# Patient Record
Sex: Female | Born: 1950 | Race: Black or African American | Hispanic: No | Marital: Married | Smoking: Former smoker
Health system: Southern US, Community
[De-identification: ages and names within clinical notes are randomized; demographics above are authoritative.]

## PROBLEM LIST (undated history)

## (undated) DIAGNOSIS — R0600 Dyspnea, unspecified: Secondary | ICD-10-CM

## (undated) DIAGNOSIS — T8859XA Other complications of anesthesia, initial encounter: Secondary | ICD-10-CM

## (undated) DIAGNOSIS — M1990 Unspecified osteoarthritis, unspecified site: Secondary | ICD-10-CM

## (undated) DIAGNOSIS — T4145XA Adverse effect of unspecified anesthetic, initial encounter: Secondary | ICD-10-CM

## (undated) DIAGNOSIS — I499 Cardiac arrhythmia, unspecified: Secondary | ICD-10-CM

## (undated) DIAGNOSIS — I10 Essential (primary) hypertension: Secondary | ICD-10-CM

## (undated) DIAGNOSIS — I517 Cardiomegaly: Secondary | ICD-10-CM

## (undated) DIAGNOSIS — I209 Angina pectoris, unspecified: Secondary | ICD-10-CM

## (undated) DIAGNOSIS — Z9989 Dependence on other enabling machines and devices: Secondary | ICD-10-CM

## (undated) DIAGNOSIS — I4821 Permanent atrial fibrillation: Secondary | ICD-10-CM

## (undated) DIAGNOSIS — E785 Hyperlipidemia, unspecified: Secondary | ICD-10-CM

## (undated) DIAGNOSIS — R600 Localized edema: Secondary | ICD-10-CM

## (undated) DIAGNOSIS — I420 Dilated cardiomyopathy: Secondary | ICD-10-CM

## (undated) DIAGNOSIS — I2510 Atherosclerotic heart disease of native coronary artery without angina pectoris: Secondary | ICD-10-CM

## (undated) DIAGNOSIS — I700 Atherosclerosis of aorta: Secondary | ICD-10-CM

## (undated) DIAGNOSIS — E876 Hypokalemia: Secondary | ICD-10-CM

## (undated) DIAGNOSIS — Z9861 Coronary angioplasty status: Secondary | ICD-10-CM

## (undated) DIAGNOSIS — R011 Cardiac murmur, unspecified: Secondary | ICD-10-CM

## (undated) DIAGNOSIS — C50919 Malignant neoplasm of unspecified site of unspecified female breast: Secondary | ICD-10-CM

## (undated) DIAGNOSIS — G4733 Obstructive sleep apnea (adult) (pediatric): Secondary | ICD-10-CM

## (undated) DIAGNOSIS — F419 Anxiety disorder, unspecified: Secondary | ICD-10-CM

## (undated) DIAGNOSIS — N183 Chronic kidney disease, stage 3 unspecified: Secondary | ICD-10-CM

## (undated) DIAGNOSIS — R0609 Other forms of dyspnea: Secondary | ICD-10-CM

## (undated) DIAGNOSIS — D259 Leiomyoma of uterus, unspecified: Secondary | ICD-10-CM

## (undated) DIAGNOSIS — I509 Heart failure, unspecified: Secondary | ICD-10-CM

## (undated) DIAGNOSIS — I5042 Chronic combined systolic (congestive) and diastolic (congestive) heart failure: Secondary | ICD-10-CM

## (undated) DIAGNOSIS — I272 Pulmonary hypertension, unspecified: Secondary | ICD-10-CM

## (undated) DIAGNOSIS — K219 Gastro-esophageal reflux disease without esophagitis: Secondary | ICD-10-CM

## (undated) DIAGNOSIS — I340 Nonrheumatic mitral (valve) insufficiency: Principal | ICD-10-CM

## (undated) HISTORY — PX: COLONOSCOPY: SHX174

## (undated) HISTORY — DX: Cardiac arrhythmia, unspecified: I49.9

## (undated) HISTORY — DX: Pulmonary hypertension, unspecified: I27.20

## (undated) HISTORY — DX: Dependence on other enabling machines and devices: Z99.89

## (undated) HISTORY — DX: Permanent atrial fibrillation: I48.21

## (undated) HISTORY — PX: TRANSTHORACIC ECHOCARDIOGRAM: SHX275

## (undated) HISTORY — PX: NM MYOVIEW LTD: HXRAD82

## (undated) HISTORY — DX: Angina pectoris, unspecified: I20.9

## (undated) HISTORY — DX: Obstructive sleep apnea (adult) (pediatric): G47.33

## (undated) HISTORY — DX: Chronic combined systolic (congestive) and diastolic (congestive) heart failure: I50.42

## (undated) HISTORY — DX: Atherosclerotic heart disease of native coronary artery without angina pectoris: I25.10

## (undated) HISTORY — DX: Dilated cardiomyopathy: I42.0

## (undated) HISTORY — PX: DILATION AND CURETTAGE OF UTERUS: SHX78

## (undated) HISTORY — DX: Coronary angioplasty status: Z98.61

## (undated) HISTORY — DX: Localized edema: R60.0

---

## 1999-05-19 HISTORY — PX: MASTECTOMY: SHX3

## 2004-05-18 DIAGNOSIS — I2510 Atherosclerotic heart disease of native coronary artery without angina pectoris: Secondary | ICD-10-CM

## 2004-05-18 HISTORY — PX: CORONARY ANGIOPLASTY WITH STENT PLACEMENT: SHX49

## 2004-05-18 HISTORY — DX: Atherosclerotic heart disease of native coronary artery without angina pectoris: I25.10

## 2006-05-18 DIAGNOSIS — C50919 Malignant neoplasm of unspecified site of unspecified female breast: Secondary | ICD-10-CM

## 2006-05-18 HISTORY — PX: MASTECTOMY: SHX3

## 2006-05-18 HISTORY — DX: Malignant neoplasm of unspecified site of unspecified female breast: C50.919

## 2008-04-23 LAB — PAP SMEAR

## 2011-07-12 ENCOUNTER — Inpatient Hospital Stay (HOSPITAL_COMMUNITY)
Admission: EM | Admit: 2011-07-12 | Discharge: 2011-07-14 | DRG: 287 | Disposition: A | Discharge status: Home / Self Care | Payer: 59 | Source: Ambulatory Visit | Attending: Cardiology | Admitting: Cardiology

## 2011-07-12 ENCOUNTER — Encounter (HOSPITAL_COMMUNITY): Payer: Self-pay

## 2011-07-12 ENCOUNTER — Emergency Department (HOSPITAL_COMMUNITY): Payer: 59

## 2011-07-12 ENCOUNTER — Other Ambulatory Visit: Payer: Self-pay

## 2011-07-12 DIAGNOSIS — IMO0001 Reserved for inherently not codable concepts without codable children: Secondary | ICD-10-CM | POA: Diagnosis present

## 2011-07-12 DIAGNOSIS — G4733 Obstructive sleep apnea (adult) (pediatric): Secondary | ICD-10-CM | POA: Diagnosis present

## 2011-07-12 DIAGNOSIS — E119 Type 2 diabetes mellitus without complications: Secondary | ICD-10-CM | POA: Diagnosis present

## 2011-07-12 DIAGNOSIS — I4891 Unspecified atrial fibrillation: Principal | ICD-10-CM

## 2011-07-12 DIAGNOSIS — Z87891 Personal history of nicotine dependence: Secondary | ICD-10-CM

## 2011-07-12 DIAGNOSIS — Z79899 Other long term (current) drug therapy: Secondary | ICD-10-CM

## 2011-07-12 DIAGNOSIS — I25118 Atherosclerotic heart disease of native coronary artery with other forms of angina pectoris: Secondary | ICD-10-CM | POA: Diagnosis present

## 2011-07-12 DIAGNOSIS — E876 Hypokalemia: Secondary | ICD-10-CM | POA: Diagnosis present

## 2011-07-12 DIAGNOSIS — I209 Angina pectoris, unspecified: Secondary | ICD-10-CM | POA: Diagnosis present

## 2011-07-12 DIAGNOSIS — Z6841 Body Mass Index (BMI) 40.0 and over, adult: Secondary | ICD-10-CM

## 2011-07-12 DIAGNOSIS — D259 Leiomyoma of uterus, unspecified: Secondary | ICD-10-CM | POA: Diagnosis present

## 2011-07-12 DIAGNOSIS — E1169 Type 2 diabetes mellitus with other specified complication: Secondary | ICD-10-CM | POA: Diagnosis present

## 2011-07-12 DIAGNOSIS — I10 Essential (primary) hypertension: Secondary | ICD-10-CM | POA: Diagnosis present

## 2011-07-12 DIAGNOSIS — E785 Hyperlipidemia, unspecified: Secondary | ICD-10-CM | POA: Diagnosis present

## 2011-07-12 DIAGNOSIS — I2510 Atherosclerotic heart disease of native coronary artery without angina pectoris: Secondary | ICD-10-CM | POA: Diagnosis present

## 2011-07-12 DIAGNOSIS — I059 Rheumatic mitral valve disease, unspecified: Secondary | ICD-10-CM | POA: Diagnosis present

## 2011-07-12 DIAGNOSIS — I517 Cardiomegaly: Secondary | ICD-10-CM | POA: Diagnosis present

## 2011-07-12 DIAGNOSIS — Z9861 Coronary angioplasty status: Secondary | ICD-10-CM

## 2011-07-12 DIAGNOSIS — K224 Dyskinesia of esophagus: Secondary | ICD-10-CM | POA: Diagnosis present

## 2011-07-12 DIAGNOSIS — Z7982 Long term (current) use of aspirin: Secondary | ICD-10-CM

## 2011-07-12 HISTORY — DX: Essential (primary) hypertension: I10

## 2011-07-12 HISTORY — DX: Leiomyoma of uterus, unspecified: D25.9

## 2011-07-12 HISTORY — DX: Hyperlipidemia, unspecified: E78.5

## 2011-07-12 HISTORY — DX: Morbid (severe) obesity due to excess calories: E66.01

## 2011-07-12 HISTORY — DX: Malignant neoplasm of unspecified site of unspecified female breast: C50.919

## 2011-07-12 LAB — DIFFERENTIAL
Basophils Relative: 0 % (ref 0–1)
Eosinophils Absolute: 0.1 10*3/uL (ref 0.0–0.7)
Neutro Abs: 3.5 10*3/uL (ref 1.7–7.7)
Neutrophils Relative %: 49 % (ref 43–77)

## 2011-07-12 LAB — COMPREHENSIVE METABOLIC PANEL
ALT: 36 U/L — ABNORMAL HIGH (ref 0–35)
AST: 40 U/L — ABNORMAL HIGH (ref 0–37)
Albumin: 3.8 g/dL (ref 3.5–5.2)
Alkaline Phosphatase: 71 U/L (ref 39–117)
Chloride: 99 mEq/L (ref 96–112)
Potassium: 3.9 mEq/L (ref 3.5–5.1)
Sodium: 136 mEq/L (ref 135–145)
Total Protein: 7 g/dL (ref 6.0–8.3)

## 2011-07-12 LAB — CBC
MCH: 27.2 pg (ref 26.0–34.0)
MCHC: 32.8 g/dL (ref 30.0–36.0)
Platelets: 203 10*3/uL (ref 150–400)
RBC: 4.34 MIL/uL (ref 3.87–5.11)

## 2011-07-12 LAB — HEMOGLOBIN A1C
Hgb A1c MFr Bld: 9 % — ABNORMAL HIGH (ref <5.7)
Mean Plasma Glucose: 212 mg/dL — ABNORMAL HIGH (ref <117)

## 2011-07-12 LAB — GLUCOSE, CAPILLARY
Glucose-Capillary: 108 mg/dL — ABNORMAL HIGH (ref 70–99)
Glucose-Capillary: 121 mg/dL — ABNORMAL HIGH (ref 70–99)

## 2011-07-12 LAB — PROTIME-INR
INR: 0.96 (ref 0.00–1.49)
Prothrombin Time: 13 seconds (ref 11.6–15.2)

## 2011-07-12 MED ORDER — SODIUM CHLORIDE 0.9 % IJ SOLN
3.0000 mL | Freq: Two times a day (BID) | INTRAMUSCULAR | Status: DC
  Administered 2011-07-12: 3 mL via INTRAVENOUS

## 2011-07-12 MED ORDER — VALSARTAN-HYDROCHLOROTHIAZIDE 320-25 MG PO TABS: 1.0000 | ORAL_TABLET | Freq: Every day | ORAL | Status: DC

## 2011-07-12 MED ORDER — EZETIMIBE 10 MG PO TABS
10.0000 mg | ORAL_TABLET | Freq: Every day | ORAL | Status: DC
  Administered 2011-07-12: 10 mg via ORAL
  Filled 2011-07-12 (×4): qty 1

## 2011-07-12 MED ORDER — NEBIVOLOL HCL 5 MG PO TABS
5.0000 mg | ORAL_TABLET | Freq: Every day | ORAL | Status: DC
  Administered 2011-07-12 – 2011-07-13 (×2): 5 mg via ORAL
  Filled 2011-07-12 (×4): qty 1

## 2011-07-12 MED ORDER — ATORVASTATIN CALCIUM 10 MG PO TABS
10.0000 mg | ORAL_TABLET | Freq: Every day | ORAL | Status: DC
  Administered 2011-07-12: 10 mg via ORAL
  Filled 2011-07-12 (×4): qty 1

## 2011-07-12 MED ORDER — HYDROCHLOROTHIAZIDE 25 MG PO TABS: 25.0000 mg | ORAL_TABLET | Freq: Every day | ORAL | Status: DC

## 2011-07-12 MED ORDER — ENOXAPARIN SODIUM 30 MG/0.3ML ~~LOC~~ SOLN: 1.0000 mg/kg | Freq: Two times a day (BID) | SUBCUTANEOUS | Status: DC

## 2011-07-12 MED ORDER — ENOXAPARIN SODIUM 40 MG/0.4ML ~~LOC~~ SOLN
40.0000 mg | SUBCUTANEOUS | Status: DC
  Administered 2011-07-12: 40 mg via SUBCUTANEOUS
  Filled 2011-07-12 (×4): qty 0.4

## 2011-07-12 MED ORDER — LORAZEPAM 0.5 MG PO TABS: 0.5000 mg | ORAL_TABLET | Freq: Four times a day (QID) | ORAL | Status: DC | PRN

## 2011-07-12 MED ORDER — INSULIN ASPART 100 UNIT/ML ~~LOC~~ SOLN
0.0000 [IU] | Freq: Every day | SUBCUTANEOUS | Status: DC
  Filled 2011-07-12: qty 3

## 2011-07-12 MED ORDER — ACETAMINOPHEN 325 MG PO TABS: 650.0000 mg | ORAL_TABLET | Freq: Four times a day (QID) | ORAL | Status: DC | PRN

## 2011-07-12 MED ORDER — IRBESARTAN 300 MG PO TABS
300.0000 mg | ORAL_TABLET | Freq: Every day | ORAL | Status: DC
  Filled 2011-07-12 (×4): qty 1

## 2011-07-12 MED ORDER — ISOSORBIDE MONONITRATE ER 30 MG PO TB24
30.0000 mg | ORAL_TABLET | Freq: Every day | ORAL | Status: DC
  Administered 2011-07-12 – 2011-07-14 (×2): 30 mg via ORAL
  Filled 2011-07-12 (×6): qty 1

## 2011-07-12 MED ORDER — ONDANSETRON HCL 4 MG/2ML IJ SOLN: 4.0000 mg | Freq: Four times a day (QID) | INTRAMUSCULAR | Status: DC | PRN

## 2011-07-12 MED ORDER — ACETAMINOPHEN 650 MG RE SUPP: 650.0000 mg | Freq: Four times a day (QID) | RECTAL | Status: DC | PRN

## 2011-07-12 MED ORDER — POTASSIUM CHLORIDE CRYS ER 20 MEQ PO TBCR
20.0000 meq | EXTENDED_RELEASE_TABLET | Freq: Every day | ORAL | Status: DC
  Administered 2011-07-12: 20 meq via ORAL
  Filled 2011-07-12 (×2): qty 1

## 2011-07-12 MED ORDER — FUROSEMIDE 80 MG PO TABS
80.0000 mg | ORAL_TABLET | Freq: Every day | ORAL | Status: DC
Start: 2011-07-12 — End: 2011-07-12
  Administered 2011-07-12: 80 mg via ORAL
  Filled 2011-07-12: qty 1

## 2011-07-12 MED ORDER — COLCHICINE 0.6 MG PO TABS
0.6000 mg | ORAL_TABLET | Freq: Every day | ORAL | Status: DC | PRN
  Filled 2011-07-12: qty 1

## 2011-07-12 MED ORDER — SODIUM CHLORIDE 0.9 % IV SOLN: 250.0000 mL | INTRAVENOUS | Status: DC | PRN

## 2011-07-12 MED ORDER — DIAZEPAM 5 MG PO TABS: 5.0000 mg | ORAL_TABLET | ORAL | Status: DC

## 2011-07-12 MED ORDER — SODIUM CHLORIDE 0.9 % IJ SOLN: 3.0000 mL | INTRAMUSCULAR | Status: DC | PRN

## 2011-07-12 MED ORDER — OXYCODONE HCL 5 MG PO TABS: 5.0000 mg | ORAL_TABLET | ORAL | Status: DC | PRN

## 2011-07-12 MED ORDER — ONDANSETRON HCL 4 MG PO TABS: 4.0000 mg | ORAL_TABLET | Freq: Four times a day (QID) | ORAL | Status: DC | PRN

## 2011-07-12 MED ORDER — HYDROMORPHONE HCL PF 1 MG/ML IJ SOLN: 0.5000 mg | INTRAMUSCULAR | Status: DC | PRN

## 2011-07-12 MED ORDER — ALUM & MAG HYDROXIDE-SIMETH 200-200-20 MG/5ML PO SUSP: 30.0000 mL | Freq: Four times a day (QID) | ORAL | Status: DC | PRN

## 2011-07-12 MED ORDER — INSULIN ASPART 100 UNIT/ML ~~LOC~~ SOLN
0.0000 [IU] | Freq: Three times a day (TID) | SUBCUTANEOUS | Status: DC
  Administered 2011-07-12 – 2011-07-13 (×3): 2 [IU] via SUBCUTANEOUS
  Filled 2011-07-12: qty 3

## 2011-07-12 MED ORDER — SODIUM CHLORIDE 0.9 % IV SOLN
1.0000 mL/kg/h | INTRAVENOUS | Status: DC
  Administered 2011-07-12: 1 mL/kg/h via INTRAVENOUS

## 2011-07-12 MED ORDER — FUROSEMIDE 80 MG PO TABS
80.0000 mg | ORAL_TABLET | Freq: Every day | ORAL | Status: DC
  Filled 2011-07-12 (×3): qty 1

## 2011-07-12 MED ORDER — SODIUM CHLORIDE 0.9 % IJ SOLN
3.0000 mL | Freq: Two times a day (BID) | INTRAMUSCULAR | Status: DC
  Administered 2011-07-12 – 2011-07-13 (×3): 3 mL via INTRAVENOUS

## 2011-07-12 MED ORDER — VITAMIN D3 25 MCG (1000 UNIT) PO TABS
2000.0000 [IU] | ORAL_TABLET | Freq: Every day | ORAL | Status: DC
  Administered 2011-07-12: 2000 [IU] via ORAL
  Filled 2011-07-12 (×4): qty 2

## 2011-07-12 MED ORDER — SODIUM CHLORIDE 0.9 % IV SOLN
INTRAVENOUS | Status: AC
  Administered 2011-07-12: 06:00:00 via INTRAVENOUS

## 2011-07-12 MED ORDER — ALLOPURINOL 300 MG PO TABS
300.0000 mg | ORAL_TABLET | Freq: Every day | ORAL | Status: DC | PRN
  Filled 2011-07-12: qty 1

## 2011-07-12 MED ORDER — DIAZEPAM 5 MG PO TABS
5.0000 mg | ORAL_TABLET | ORAL | Status: AC
  Administered 2011-07-13: 5 mg via ORAL
  Filled 2011-07-12: qty 1

## 2011-07-12 MED ORDER — ASPIRIN 81 MG PO CHEW
324.0000 mg | CHEWABLE_TABLET | ORAL | Status: AC
  Administered 2011-07-13: 324 mg via ORAL
  Filled 2011-07-12 (×2): qty 4

## 2011-07-12 MED ORDER — ZOLPIDEM TARTRATE 5 MG PO TABS
5.0000 mg | ORAL_TABLET | Freq: Every evening | ORAL | Status: DC | PRN
  Administered 2011-07-12: 5 mg via ORAL
  Filled 2011-07-12: qty 1

## 2011-07-12 MED ORDER — ASPIRIN EC 325 MG PO TBEC
325.0000 mg | DELAYED_RELEASE_TABLET | Freq: Every day | ORAL | Status: DC
  Administered 2011-07-12: 325 mg via ORAL
  Filled 2011-07-12 (×4): qty 1

## 2011-07-12 MED ORDER — PNEUMOCOCCAL VAC POLYVALENT 25 MCG/0.5ML IJ INJ
0.5000 mL | INJECTION | INTRAMUSCULAR | Status: AC
  Administered 2011-07-13: 0.5 mL via INTRAMUSCULAR
  Filled 2011-07-12: qty 0.5

## 2011-07-12 NOTE — Progress Notes (Signed)
Patient admitted from the ED, alert and oriented X3 denies any pain/chest pain. Oriented patient to room/unit; reviewed plan of care with patient/family; skin intact. Patient in bed resting. Continue to assess patient.

## 2011-07-12 NOTE — Consult Note (Addendum)
NAME:  Stephanie Franco   MRN: [id number] DOB:  1951/01/04   ADMIT DATE: 07/12/2011  Reason for Consult: Chest pain  Requesting Physician: [facility]  HPI: This is a 61 y.o. female with a past medical history significant for CAD, s/p sten in 2005 in [place], ([place] to [facility] 7/05). She moved here about a year ago. She has been followed by Dr Karlton Lemon here. Apparently she has been in atrial fibrillation, (? Duration). She was tried on Pradaxa but had vaginal bleeding. Last night she had Lt sided chest pain that radiated to her Rt neck. She took a total of 4 NTG with eventual relief.   PMHx:  Past Medical History  Diagnosis Date  . Coronary artery disease   . Stented coronary artery - L Circumflex Taxus DES 2005  . Hypertension   . Diabetes mellitus   . Breast cancer   . Hyperlipidemia   . Morbid obesity    Past Surgical History  Procedure Date  . Mastectomy     Left Breast 2008  . Ptca with stent x 1     2005    FAMHx: Family History  Problem Relation Age of Onset  . Coronary artery disease Mother   . Hypertension Mother   . Atrial fibrillation Son     SOCHx:  reports that she has quit smoking. She does not have any smokeless tobacco history on file. She reports that she does not drink alcohol or use illicit drugs.  ALLERGIES: No Known Allergies  ROS: Pertinent items are noted in HPI. Review of Systems - Negative except for chest pain & palpitations that are occasionally associated with shortness of breath; recent uterine bleeding on Pradaxa.   HOME MEDICATIONS: Prescriptions prior to admission  Medication Sig Dispense Refill  . allopurinol (ZYLOPRIM) 300 MG tablet Take 300 mg by mouth daily as needed. Gout      . aspirin EC 81 MG tablet Take 81 mg by mouth daily.      . cholecalciferol (VITAMIN D) 1000 UNITS tablet Take 2,000 Units by mouth daily.      . colchicine 0.6 MG tablet Take 0.6 mg by mouth daily as needed. gout      . ezetimibe (ZETIA) 10 MG tablet Take  10 mg by mouth daily.      . furosemide (LASIX) 80 MG tablet Take 80 mg by mouth daily.      Marland Kitchen LORazepam (ATIVAN) 0.5 MG tablet Take 0.5 mg by mouth every 6 (six) hours as needed. Anxiety      . metFORMIN (GLUCOPHAGE-XR) 750 MG 24 hr tablet Take 750 mg by mouth daily with breakfast.      . nebivolol (BYSTOLIC) 5 MG tablet Take 5 mg by mouth daily.      . potassium chloride SA (K-DUR,KLOR-CON) 20 MEQ tablet Take 20 mEq by mouth daily.      . rosuvastatin (CRESTOR) 10 MG tablet Take 10 mg by mouth daily.      . valsartan-hydrochlorothiazide (DIOVAN-HCT) 320-25 MG per tablet Take 1 tablet by mouth daily.        HOSPITAL MEDICATIONS: I have reviewed the patient's current medications.    . sodium chloride   Intravenous STAT  . aspirin  324 mg Oral Pre-Cath  . aspirin EC  325 mg Oral Daily  . atorvastatin  10 mg Oral q1800  . cholecalciferol  2,000 Units Oral Daily  . diazepam  5 mg Oral On Call  . enoxaparin  40 mg Subcutaneous Q24H  .  ezetimibe  10 mg Oral Daily  . furosemide  80 mg Oral Daily  . insulin aspart  0-15 Units Subcutaneous TID WC  . insulin aspart  0-5 Units Subcutaneous QHS  . irbesartan  300 mg Oral Daily  . isosorbide mononitrate  30 mg Oral Daily  . nebivolol  5 mg Oral Daily  . pneumococcal 23 valent vaccine  0.5 mL Intramuscular Tomorrow-1000  . potassium chloride  20 mEq Oral Once  . potassium chloride SA  20 mEq Oral Daily  . potassium chloride  40 mEq Oral Once  . sodium chloride  3 mL Intravenous Q12H  . sodium chloride  3 mL Intravenous Q12H  . DISCONTD: diazepam  5 mg Oral On Call  . DISCONTD: furosemide  80 mg Oral Daily  . DISCONTD: hydrochlorothiazide  25 mg Oral Daily  . DISCONTD: potassium chloride SA  20 mEq Oral Daily  . DISCONTD: valsartan-hydrochlorothiazide  1 tablet Oral Daily   VITALS: Blood pressure 124/76, pulse 59, temperature 98.3 F (36.8 C), temperature source Oral, resp. rate 17, height 5\' 3"  (1.6 m), weight 110.9 kg (244 lb 7.8 oz),  SpO2 97.00%.  PHYSICAL EXAM: General appearance: alert, cooperative and no distress Neck: no adenopathy, no carotid bruit, no JVD, supple, symmetrical, trachea midline and thyroid not enlarged, symmetric, no tenderness/mass/nodules Lungs: clear to auscultation bilaterally Heart: irregularly irregular rhythm - 2/6 HSM heard loudest @ apex. Abdomen: obese Extremities: extremities normal, atraumatic, no cyanosis or edema Pulses: 2+ and symmetric Skin: Skin color, texture, turgor normal. No rashes or lesions; L mastectomy wounds well  Healed. Neurologic: Grossly normal; CN II-XII grossly intact  LABS: Results for orders placed during the hospital encounter of 07/12/11 (from the past 48 hour(s))  CBC     Status: Abnormal   Collection Time   07/12/11  3:27 AM      Component Value Range Comment   WBC 7.2  4.0 - 10.5 (K/uL)    RBC 4.34  3.87 - 5.11 (MIL/uL)    Hemoglobin 11.8 (*) 12.0 - 15.0 (g/dL)    HCT 36.0  36.0 - 46.0 (%)    MCV 82.9  78.0 - 100.0 (fL)    MCH 27.2  26.0 - 34.0 (pg)    MCHC 32.8  30.0 - 36.0 (g/dL)    RDW 15.7 (*) 11.5 - 15.5 (%)    Platelets 203  150 - 400 (K/uL)   DIFFERENTIAL     Status: Normal   Collection Time   07/12/11  3:27 AM      Component Value Range Comment   Neutrophils Relative 49  43 - 77 (%)    Neutro Abs 3.5  1.7 - 7.7 (K/uL)    Lymphocytes Relative 39  12 - 46 (%)    Lymphs Abs 2.8  0.7 - 4.0 (K/uL)    Monocytes Relative 11  3 - 12 (%)    Monocytes Absolute 0.8  0.1 - 1.0 (K/uL)    Eosinophils Relative 1  0 - 5 (%)    Eosinophils Absolute 0.1  0.0 - 0.7 (K/uL)    Basophils Relative 0  0 - 1 (%)    Basophils Absolute 0.0  0.0 - 0.1 (K/uL)   COMPREHENSIVE METABOLIC PANEL     Status: Abnormal   Collection Time   07/12/11  3:27 AM      Component Value Range Comment   Sodium 136  135 - 145 (mEq/L)    Potassium 3.9  3.5 - 5.1 (mEq/L)  Chloride 99  96 - 112 (mEq/L)    CO2 24  19 - 32 (mEq/L)    Glucose, Bld 125 (*) 70 - 99 (mg/dL)    BUN 28  (*) 6 - 23 (mg/dL)    Creatinine, Ser 0.86  0.50 - 1.10 (mg/dL)    Calcium 9.4  8.4 - 10.5 (mg/dL)    Total Protein 7.0  6.0 - 8.3 (g/dL)    Albumin 3.8  3.5 - 5.2 (g/dL)    AST 40 (*) 0 - 37 (U/L) SLIGHT HEMOLYSIS   ALT 36 (*) 0 - 35 (U/L)    Alkaline Phosphatase 71  39 - 117 (U/L)    Total Bilirubin 0.5  0.3 - 1.2 (mg/dL)    GFR calc non Af Amer 71 (*) >90 (mL/min)    GFR calc Af Amer 82 (*) >90 (mL/min)   TROPONIN I     Status: Normal   Collection Time   07/12/11  3:27 AM      Component Value Range Comment   Troponin I <0.30  <0.30 (ng/mL)   HEMOGLOBIN A1C     Status: Abnormal   Collection Time   07/12/11  3:55 AM      Component Value Range Comment   Hemoglobin A1C 9.0 (*) <5.7 (%)    Mean Plasma Glucose 212 (*) <117 (mg/dL)   GLUCOSE, CAPILLARY     Status: Abnormal   Collection Time   07/12/11  9:07 AM      Component Value Range Comment   Glucose-Capillary 108 (*) 70 - 99 (mg/dL)   GLUCOSE, CAPILLARY     Status: Abnormal   Collection Time   07/12/11 12:03 PM      Component Value Range Comment   Glucose-Capillary 121 (*) 70 - 99 (mg/dL)   GLUCOSE, CAPILLARY     Status: Abnormal   Collection Time   07/12/11  4:46 PM      Component Value Range Comment   Glucose-Capillary 136 (*) 70 - 99 (mg/dL)   PROTIME-INR     Status: Normal   Collection Time   07/12/11  4:53 PM      Component Value Range Comment   Prothrombin Time 13.0  11.6 - 15.2 (seconds)    INR 0.96  0.00 - 1.49    GLUCOSE, CAPILLARY     Status: Abnormal   Collection Time   07/12/11  9:55 PM      Component Value Range Comment   Glucose-Capillary 128 (*) 70 - 99 (mg/dL)    Comment 1 Notify RN     BASIC METABOLIC PANEL     Status: Abnormal   Collection Time   07/13/11  4:24 AM      Component Value Range Comment   Sodium 140  135 - 145 (mEq/L)    Potassium 3.1 (*) 3.5 - 5.1 (mEq/L)    Chloride 104  96 - 112 (mEq/L)    CO2 29  19 - 32 (mEq/L)    Glucose, Bld 127 (*) 70 - 99 (mg/dL)    BUN 19  6 - 23 (mg/dL)     Creatinine, Ser 0.75  0.50 - 1.10 (mg/dL)    Calcium 9.5  8.4 - 10.5 (mg/dL)    GFR calc non Af Amer 89 (*) >90 (mL/min)    GFR calc Af Amer >90  >90 (mL/min)   CBC     Status: Abnormal   Collection Time   07/13/11  4:24 AM      Component Value  Range Comment   WBC 5.4  4.0 - 10.5 (K/uL)    RBC 3.97  3.87 - 5.11 (MIL/uL)    Hemoglobin 10.8 (*) 12.0 - 15.0 (g/dL)    HCT 32.9 (*) 36.0 - 46.0 (%)    MCV 82.9  78.0 - 100.0 (fL)    MCH 27.2  26.0 - 34.0 (pg)    MCHC 32.8  30.0 - 36.0 (g/dL)    RDW 15.8 (*) 11.5 - 15.5 (%)    Platelets 214  150 - 400 (K/uL)   LIPID PANEL     Status: Abnormal   Collection Time   07/13/11  4:24 AM      Component Value Range Comment   Cholesterol 114  0 - 200 (mg/dL)    Triglycerides 78  <150 (mg/dL)    HDL 33 (*) >39 (mg/dL)    Total CHOL/HDL Ratio 3.5      VLDL 16  0 - 40 (mg/dL)    LDL Cholesterol 65  0 - 99 (mg/dL)   GLUCOSE, CAPILLARY     Status: Abnormal   Collection Time   07/13/11  8:26 AM      Component Value Range Comment   Glucose-Capillary 150 (*) 70 - 99 (mg/dL)     IMAGING: Dg Chest 2 View  07/12/2011  *RADIOLOGY REPORT*  Clinical Data: Chest pain.  CHEST - 2 VIEW  Comparison: None  Findings: There is moderate cardiomegaly.  Lungs clear.  No effusions.  No acute bony abnormality.  IMPRESSION: Cardiomegaly.  No active disease.  Original Report Authenticated By: Raelyn Number, M.D.   EKG- AF, PVC, LVH by voltage  IMPRESSION: Principal Problem:  *Unstable angina Active Problems:  Coronary artery disease, Stent placed in 2005 in [place], negative nuc 3 yrs ago  Hypertension  Atrial fibrillation, ? duration  Chest pain  Morbid obesity  Uterine fibroid, (bleeding issues when she was put on Pradaxa for AF)  Hyperlipidemia  Sleep apnea, on C-Pap   RECOMMENDATION: MD to see. She may need diagnostic cath. Not on full dose anticoagulation secondary to bleeding histroy   Time Spent Directly with Patient: 40 minutes  Erlene Quan 07/02/2011, 3:35 PM  I have seen & examined the patient this AM after seen by Mr. Rosalyn Gess last PM.  I have reviewed the chart & agree with Luke's note with my attestations noted above in bold.   Stephanie Franco has known CAD with prior PCI and a relatively new Dx of Atrial Fibrillation.  She has not noted "chest pain" associated with her Afib - but has noted some increasing dyspnea & palpitations.  Unfortunately, her Pradaxa is being held due to concern for Uterine Bleeding - but her Hgb is stable.    She has noted some chest pain since her PCI that has always been relieved by SL NTG 1-2 tabs (2nd one usually b/c the 1sr doesn't desolve).  However on the night preceding her admission, she had prolonged pain that was not relieved until the 4th NTG tablet.  Over night she has had 2 runs of NSVT vs Afib with Aberrancy that are more worrisome for NSVT - and therefore more concerning for ischemia.   With her history & RFs as well as obesity (decreasing the effectiveness of NST), we discussed options of invasive vs. Non-invasive evaluation.  Based upon out discussion, she and her husband agree that the most definitive way to evaluate for a CAD origin for her chest pain is with invasive evaluation via cardiac  catheterization.  I agree with Koren Bound initial thoughts in his recommendations. I am a bit concerned with the potential of PCI & requirement of dual antiplatelet therapy -- I would consideder FFR evaluation of equivocal lesions prior to PCI.  Performing MD:  Leonie Man, M.D., M.S.  Procedure:    Left Heart Catheterization with Coronary Angiography  Possible Percutaneous Coronary Intervention (Fractional Flow Reserve Measurement, Balloon, &/or Stent)  The procedure with Risks/Benefits/Alternatives and Indications was reviewed with the patient.  All questions were answered.    Risks / Complications include, but not limited to: Death, MI, CVA/TIA, VF/VT (with defibrillation), Bradycardia (need  for temporary pacer placement), contrast induced nephropathy, bleeding / bruising / hematoma / pseudoaneurysm, vascular or coronary injury (with possible emergent CT or Vascular Surgery), adverse medication reactions, infection.    The patient voices understanding and agree to proceed.     Leonie Man, M.D., M.S. [facility] [address] [place], [place]  [address]  [phone]  07/13/2011 10:44 AM

## 2011-07-12 NOTE — ED Provider Notes (Signed)
History     CSN: [id number]  Arrival date & time 07/12/11  [id number]   First MD Initiated Contact with Patient 07/12/11 [id number]      Chief Complaint  Patient presents with  . Chest Pain    (Consider location/radiation/quality/duration/timing/severity/associated sxs/prior treatment) HPI  H/o CAD s/p stent (2005), HTN, DM, afib pw chest pain since last night 11PM. Describes it as chest discomfort, cramping. No pressure. Intermittent across precordium Occ radiation to Rt neck. Pain 9/10 at max, none currently. Denies shortness of breath. No back pain. Denies fever/chills/cough. Denies n/v/diaphoresis. Took ntg and ASA prior to arrival. Denies h/o VTE in self or family. No recent hosp/surg/immob. She does have h/o breast cancer s/p mastectomy in remission. Denies exogenous hormone use, no leg pain or swelling.  Has upcoming initial appt with [facility]   Past Medical History  Diagnosis Date  . Coronary artery disease   . Stented coronary artery   . Hypertension   . Diabetes mellitus     Past Surgical History  Procedure Date  . Mastectomy     History reviewed. No pertinent family history.  History  Substance Use Topics  . Smoking status: Former Research scientist (life sciences)  . Smokeless tobacco: Not on file  . Alcohol Use: No    OB History    Grav Para Term Preterm Abortions TAB SAB Ect Mult Living                  Review of Systems  All other systems reviewed and are negative.  except as noted HPI   Allergies  Review of patient's allergies indicates no known allergies.  Home Medications   Current Outpatient Rx  Name Route Sig Dispense Refill  . ALLOPURINOL 300 MG PO TABS Oral Take 300 mg by mouth daily as needed. Gout    . ASPIRIN EC 81 MG PO TBEC Oral Take 81 mg by mouth daily.    Marland Kitchen VITAMIN D 1000 UNITS PO TABS Oral Take 2,000 Units by mouth daily.    . COLCHICINE 0.6 MG PO TABS Oral Take 0.6 mg by mouth daily as needed. gout    . EZETIMIBE 10 MG PO TABS Oral Take 10 mg by mouth daily.      . FUROSEMIDE 80 MG PO TABS Oral Take 80 mg by mouth daily.    Marland Kitchen LORAZEPAM 0.5 MG PO TABS Oral Take 0.5 mg by mouth every 6 (six) hours as needed. Anxiety    . METFORMIN HCL ER 750 MG PO TB24 Oral Take 750 mg by mouth daily with breakfast.    . NEBIVOLOL HCL 5 MG PO TABS Oral Take 5 mg by mouth daily.    Marland Kitchen POTASSIUM CHLORIDE CRYS ER 20 MEQ PO TBCR Oral Take 20 mEq by mouth daily.    Marland Kitchen ROSUVASTATIN CALCIUM 10 MG PO TABS Oral Take 10 mg by mouth daily.    Marland Kitchen VALSARTAN-HYDROCHLOROTHIAZIDE 320-25 MG PO TABS Oral Take 1 tablet by mouth daily.      BP 148/70  Pulse 85  Temp(Src) 98.1 F (36.7 C) (Oral)  Resp 17  SpO2 98%   Physical Exam  Nursing note and vitals reviewed. Constitutional: She is oriented to person, place, and time. She appears well-developed.  HENT:  Head: Atraumatic.  Mouth/Throat: Oropharynx is clear and moist.  Eyes: Conjunctivae and EOM are normal. Pupils are equal, round, and reactive to light.  Neck: Normal range of motion. Neck supple.  Cardiovascular: Normal rate, regular rhythm, normal heart sounds and intact distal pulses.  Pulmonary/Chest: Effort normal and breath sounds normal. No respiratory distress. She has no wheezes. She has no rales.  Abdominal: Soft. She exhibits no distension. There is no tenderness. There is no rebound and no guarding.  Musculoskeletal: Normal range of motion.  Neurological: She is alert and oriented to person, place, and time.  Skin: Skin is warm and dry. No rash noted.  Psychiatric: She has a normal mood and affect.    Date: 07/12/2011  Rate: 98  Rhythm: atrial fibrillation  QRS Axis: left  Intervals: QT prolonged  ST/T Wave abnormalities: normal  Conduction Disutrbances:nonspecific intraventricular conduction delay  Narrative Interpretation:   Old EKG Reviewed: none available   ED Course  Procedures (including critical care time)  Labs Reviewed  CBC - Abnormal; Notable for the following:    Hemoglobin 11.8 (*)    RDW  15.7 (*)    All other components within normal limits  COMPREHENSIVE METABOLIC PANEL - Abnormal; Notable for the following:    Glucose, Bld 125 (*)    BUN 28 (*)    AST 40 (*) SLIGHT HEMOLYSIS   ALT 36 (*)    GFR calc non Af Amer 71 (*)    GFR calc Af Amer 82 (*)    All other components within normal limits  DIFFERENTIAL  TROPONIN I   Dg Chest 2 View  07/12/2011  *RADIOLOGY REPORT*  Clinical Data: Chest pain.  CHEST - 2 VIEW  Comparison: None  Findings: There is moderate cardiomegaly.  Lungs clear.  No effusions.  No acute bony abnormality.  IMPRESSION: Cardiomegaly.  No active disease.  Original Report Authenticated By: Raelyn Number, M.D.    1. Chest pain   2. Atrial fibrillation     MDM   60yoF h/o CAD pw intermittent chest pressure radiating to neck. Relieved with ntg, asa pta. Asx in ED.  She has a TIMI score of 3. Less likely acute PE, dissection, pna, ptx. Rate controlled atrial fibrillation. Given RF will admit for formal r/o. She is to be pt of [facility] and [facility] Discussed admission with Dr. Arnoldo Morale.       Blair Heys, MD 07/12/11 [phone]

## 2011-07-12 NOTE — Progress Notes (Signed)
Patient had 2.09sec pause on heart monitor , assessed patient, she denies any pain and stated she was asleep; does have history of sleep apnea. Dr Karleen Hampshire notified, not new order given, will continue to assess patient.

## 2011-07-12 NOTE — ED Notes (Signed)
Patient being transported to floor by Romie Minus.

## 2011-07-12 NOTE — Progress Notes (Signed)
23:15-Notified Aaron Edelman hager,PA with cardiology reguarding the freq pauses tonight & the 2.32 sec pause at 22:36pm & the 7 beats of V-Tach at 22:47-all of which are asymptomatic at this time & pt denies any chest pain, pt is for cardiac cath in the am. He asks not to be called for any more pauses or V-Tach tonight unless the pt has > 10 beats of V-tach & the pt is truly symptomatic at the time of the events. Will con't to monitor pt for problems.

## 2011-07-12 NOTE — ED Notes (Signed)
Pt on hold for transfer per Dr. Arnoldo Morale.  Notified dept.  Pt to hold post shift change for transfer.

## 2011-07-12 NOTE — Progress Notes (Signed)
Subjective: Pain is 2/10.  Objective: Weight change:   Intake/Output Summary (Last 24 hours) at 07/12/11 1609 Last data filed at 07/12/11 1300  Gross per 24 hour  Intake    240 ml  Output    300 ml  Net    -60 ml    Filed Vitals:   07/12/11 1300  BP: 144/83  Pulse: 70  Temp: 99 F (37.2 C)  Resp: 16   Pt is alert afebrile comfortable  cvs s1s2 heard Lungs clear Abdomen soft nontender Extremities no pedal edema.  Lab Results: Results for orders placed during the hospital encounter of 07/12/11 (from the past 24 hour(s))  CBC     Status: Abnormal   Collection Time   07/12/11  3:27 AM      Component Value Range   WBC 7.2  4.0 - 10.5 (K/uL)   RBC 4.34  3.87 - 5.11 (MIL/uL)   Hemoglobin 11.8 (*) 12.0 - 15.0 (g/dL)   HCT 36.0  36.0 - 46.0 (%)   MCV 82.9  78.0 - 100.0 (fL)   MCH 27.2  26.0 - 34.0 (pg)   MCHC 32.8  30.0 - 36.0 (g/dL)   RDW 15.7 (*) 11.5 - 15.5 (%)   Platelets 203  150 - 400 (K/uL)  DIFFERENTIAL     Status: Normal   Collection Time   07/12/11  3:27 AM      Component Value Range   Neutrophils Relative 49  43 - 77 (%)   Neutro Abs 3.5  1.7 - 7.7 (K/uL)   Lymphocytes Relative 39  12 - 46 (%)   Lymphs Abs 2.8  0.7 - 4.0 (K/uL)   Monocytes Relative 11  3 - 12 (%)   Monocytes Absolute 0.8  0.1 - 1.0 (K/uL)   Eosinophils Relative 1  0 - 5 (%)   Eosinophils Absolute 0.1  0.0 - 0.7 (K/uL)   Basophils Relative 0  0 - 1 (%)   Basophils Absolute 0.0  0.0 - 0.1 (K/uL)  COMPREHENSIVE METABOLIC PANEL     Status: Abnormal   Collection Time   07/12/11  3:27 AM      Component Value Range   Sodium 136  135 - 145 (mEq/L)   Potassium 3.9  3.5 - 5.1 (mEq/L)   Chloride 99  96 - 112 (mEq/L)   CO2 24  19 - 32 (mEq/L)   Glucose, Bld 125 (*) 70 - 99 (mg/dL)   BUN 28 (*) 6 - 23 (mg/dL)   Creatinine, Ser 0.86  0.50 - 1.10 (mg/dL)   Calcium 9.4  8.4 - 10.5 (mg/dL)   Total Protein 7.0  6.0 - 8.3 (g/dL)   Albumin 3.8  3.5 - 5.2 (g/dL)   AST 40 (*) 0 - 37 (U/L)   ALT 36 (*)  0 - 35 (U/L)   Alkaline Phosphatase 71  39 - 117 (U/L)   Total Bilirubin 0.5  0.3 - 1.2 (mg/dL)   GFR calc non Af Amer 71 (*) >90 (mL/min)   GFR calc Af Amer 82 (*) >90 (mL/min)  TROPONIN I     Status: Normal   Collection Time   07/12/11  3:27 AM      Component Value Range   Troponin I <0.30  <0.30 (ng/mL)  HEMOGLOBIN A1C     Status: Abnormal   Collection Time   07/12/11  3:55 AM      Component Value Range   Hemoglobin A1C 9.0 (*) <5.7 (%)   Mean Plasma  Glucose 212 (*) <117 (mg/dL)  GLUCOSE, CAPILLARY     Status: Abnormal   Collection Time   07/12/11  9:07 AM      Component Value Range   Glucose-Capillary 108 (*) 70 - 99 (mg/dL)  GLUCOSE, CAPILLARY     Status: Abnormal   Collection Time   07/12/11 12:03 PM      Component Value Range   Glucose-Capillary 121 (*) 70 - 99 (mg/dL)     Micro Results: No results found for this or any previous visit (from the past 240 hour(s)).  Studies/Results: Dg Chest 2 View  07/12/2011  *RADIOLOGY REPORT*  Clinical Data: Chest pain.  CHEST - 2 VIEW  Comparison: None  Findings: There is moderate cardiomegaly.  Lungs clear.  No effusions.  No acute bony abnormality.  IMPRESSION: Cardiomegaly.  No active disease.  Original Report Authenticated By: Raelyn Number, M.D.   Medications: Scheduled Meds:   . sodium chloride   Intravenous STAT  . aspirin EC  325 mg Oral Daily  . atorvastatin  10 mg Oral q1800  . cholecalciferol  2,000 Units Oral Daily  . enoxaparin  40 mg Subcutaneous Q24H  . ezetimibe  10 mg Oral Daily  . furosemide  80 mg Oral Daily  . insulin aspart  0-15 Units Subcutaneous TID WC  . insulin aspart  0-5 Units Subcutaneous QHS  . irbesartan  300 mg Oral Daily  . isosorbide mononitrate  30 mg Oral Daily  . nebivolol  5 mg Oral Daily  . pneumococcal 23 valent vaccine  0.5 mL Intramuscular Tomorrow-1000  . potassium chloride SA  20 mEq Oral Daily  . sodium chloride  3 mL Intravenous Q12H  . DISCONTD: enoxaparin  1 mg/kg  Subcutaneous Q12H  . DISCONTD: furosemide  80 mg Oral Daily  . DISCONTD: hydrochlorothiazide  25 mg Oral Daily  . DISCONTD: valsartan-hydrochlorothiazide  1 tablet Oral Daily   Continuous Infusions:  PRN Meds:.sodium chloride, acetaminophen, acetaminophen, allopurinol, alum & mag hydroxide-simeth, colchicine, HYDROmorphone, LORazepam, ondansetron (ZOFRAN) IV, ondansetron, oxyCODONE, sodium chloride, zolpidem  Assessment/Plan:  LOS: 0 days  Chest pain: r/o ACs. Cardiac enzymes neg so far. Cardiology consult called.   Possible cath in am  Continue with asprin/nebivolol/irbesartan/ etc.    DM: hgba1c, SSI.  Enya Bureau 07/12/2011, 4:09 PM

## 2011-07-12 NOTE — H&P (Addendum)
DATE OF ADMISSION:  07/12/2011  PCP:  Dr. Willey Blade     Chief Complaint: Chest Pain   HPI: Stephanie Franco is an 61 y.o. female  Who began to have 9/10 crampy left sided chest pain radiating into the right side that started at about 10pm.  Patient took 3 SL NTGs and had relief temporarily but had to take another  NTG and then she was brought to the ED.  She denied having SOB, nausea or vomiting or diaphoresis.  She was found to have a rate controlled Atrial fibrillation, and reports that she was diagnosed with atrial fibrillation 3 weeks ago and was placed on Pradaxa but had to stop this medication due to vaginal bleeding from Uterine fibroids.  She was sent to a Gyn for evaluation and options for treatment.  She does have a history of Breast Cancer in 2008 and is S/P Mastectomy of the left breast.  She had also been scheduled to see cardiology [facility]) as an outpatient in the upcoming weeks as well.    Past Medical History  Diagnosis Date  . Coronary artery disease   . Stented coronary artery   . Hypertension   . Diabetes mellitus   . Breast cancer   . Hyperlipidemia   . Morbid obesity     Past Surgical History  Procedure Date  . Mastectomy     Left Breast 2008  . Ptca with stent x 1     2005    Medications:  HOME MEDS: Prior to Admission medications   Medication Sig Start Date End Date Taking? Authorizing Provider  allopurinol (ZYLOPRIM) 300 MG tablet Take 300 mg by mouth daily as needed. Gout   Yes Historical Provider, MD  aspirin EC 81 MG tablet Take 81 mg by mouth daily.   Yes Historical Provider, MD  cholecalciferol (VITAMIN D) 1000 UNITS tablet Take 2,000 Units by mouth daily.   Yes Historical Provider, MD  colchicine 0.6 MG tablet Take 0.6 mg by mouth daily as needed. gout   Yes Historical Provider, MD  ezetimibe (ZETIA) 10 MG tablet Take 10 mg by mouth daily.   Yes Historical Provider, MD  furosemide (LASIX) 80 MG tablet Take 80 mg by mouth daily.    Yes Historical Provider, MD  LORazepam (ATIVAN) 0.5 MG tablet Take 0.5 mg by mouth every 6 (six) hours as needed. Anxiety   Yes Historical Provider, MD  metFORMIN (GLUCOPHAGE-XR) 750 MG 24 hr tablet Take 750 mg by mouth daily with breakfast.   Yes Historical Provider, MD  nebivolol (BYSTOLIC) 5 MG tablet Take 5 mg by mouth daily.   Yes Historical Provider, MD  potassium chloride SA (K-DUR,KLOR-CON) 20 MEQ tablet Take 20 mEq by mouth daily.   Yes Historical Provider, MD  rosuvastatin (CRESTOR) 10 MG tablet Take 10 mg by mouth daily.   Yes Historical Provider, MD  valsartan-hydrochlorothiazide (DIOVAN-HCT) 320-25 MG per tablet Take 1 tablet by mouth daily.   Yes Historical Provider, MD    Allergies:  No Known Allergies  Social History:   reports that she has quit smoking. She does not have any smokeless tobacco history on file. She reports that she does not drink alcohol or use illicit drugs.  Family History: Family History  Problem Relation Age of Onset  . Coronary artery disease Mother   . Hypertension Mother   . Atrial fibrillation Son     Review of Systems:  The patient denies anorexia, fever, weight loss, vision loss, decreased hearing, hoarseness,  syncope, dyspnea on exertion, peripheral edema, balance deficits, hemoptysis, abdominal pain, melena, hematochezia, severe indigestion/heartburn, hematuria, incontinence, genital sores, muscle weakness, suspicious skin lesions, transient blindness, difficulty walking, depression, unusual weight change, enlarged lymph nodes, angioedema, and breast masses.   Physical Exam:  GEN:  Pleasant  61 year old Morbidly Obese African American female examined  and in no acute distress; cooperative with exam Filed Vitals:   07/12/11 0302 07/12/11 0400 07/12/11 0515 07/12/11 0600  BP: 148/70   126/69  Pulse: 85 67 62 61  Temp: 98.1 F (36.7 C)     TempSrc: Oral     Resp: 17 17 18 21   SpO2: 98% 100% 100% 100%   Blood pressure 126/69, pulse 61,  temperature 98.1 F (36.7 C), temperature source Oral, resp. rate 21, SpO2 100.00%. PSYCH: She is alert and oriented x4; does not appear anxious does not appear depressed; affect is normal HEENT: Normocephalic and Atraumatic, Mucous membranes pink; PERRLA; EOM intact; Fundi:  Benign;  No scleral icterus, Nares: Patent, Oropharynx: Clear, Edentulous or Fair Dentition, Neck:  FROM, no cervical lymphadenopathy nor thyromegaly or carotid bruit; no JVD; Breasts::  Left Mastectomy CHEST WALL: No tenderness CHEST: Normal respiration, clear to auscultation bilaterally HEART: Regular rate and rhythm; no murmurs rubs or gallops BACK: No kyphosis or scoliosis; no CVA tenderness ABDOMEN: Positive Bowel Sounds, Obese, soft non-tender; no masses, no organomegaly, no pannus; no intertriginous candida. Rectal Exam: Not done EXTREMITIES: No cyanosis, clubbing or edema; no ulcerations. Genitalia: not examined PULSES: 2+ and symmetric SKIN: Normal hydration no rash or ulceration CNS: Cranial nerves 2-12 grossly intact no focal neurologic deficit   Labs & Imaging Results for orders placed during the hospital encounter of 07/12/11 (from the past 48 hour(s))  CBC     Status: Abnormal   Collection Time   07/12/11  3:27 AM      Component Value Range Comment   WBC 7.2  4.0 - 10.5 (K/uL)    RBC 4.34  3.87 - 5.11 (MIL/uL)    Hemoglobin 11.8 (*) 12.0 - 15.0 (g/dL)    HCT 36.0  36.0 - 46.0 (%)    MCV 82.9  78.0 - 100.0 (fL)    MCH 27.2  26.0 - 34.0 (pg)    MCHC 32.8  30.0 - 36.0 (g/dL)    RDW 15.7 (*) 11.5 - 15.5 (%)    Platelets 203  150 - 400 (K/uL)   DIFFERENTIAL     Status: Normal   Collection Time   07/12/11  3:27 AM      Component Value Range Comment   Neutrophils Relative 49  43 - 77 (%)    Neutro Abs 3.5  1.7 - 7.7 (K/uL)    Lymphocytes Relative 39  12 - 46 (%)    Lymphs Abs 2.8  0.7 - 4.0 (K/uL)    Monocytes Relative 11  3 - 12 (%)    Monocytes Absolute 0.8  0.1 - 1.0 (K/uL)    Eosinophils  Relative 1  0 - 5 (%)    Eosinophils Absolute 0.1  0.0 - 0.7 (K/uL)    Basophils Relative 0  0 - 1 (%)    Basophils Absolute 0.0  0.0 - 0.1 (K/uL)   COMPREHENSIVE METABOLIC PANEL     Status: Abnormal   Collection Time   07/12/11  3:27 AM      Component Value Range Comment   Sodium 136  135 - 145 (mEq/L)    Potassium 3.9  3.5 -  5.1 (mEq/L)    Chloride 99  96 - 112 (mEq/L)    CO2 24  19 - 32 (mEq/L)    Glucose, Bld 125 (*) 70 - 99 (mg/dL)    BUN 28 (*) 6 - 23 (mg/dL)    Creatinine, Ser 0.86  0.50 - 1.10 (mg/dL)    Calcium 9.4  8.4 - 10.5 (mg/dL)    Total Protein 7.0  6.0 - 8.3 (g/dL)    Albumin 3.8  3.5 - 5.2 (g/dL)    AST 40 (*) 0 - 37 (U/L) SLIGHT HEMOLYSIS   ALT 36 (*) 0 - 35 (U/L)    Alkaline Phosphatase 71  39 - 117 (U/L)    Total Bilirubin 0.5  0.3 - 1.2 (mg/dL)    GFR calc non Af Amer 71 (*) >90 (mL/min)    GFR calc Af Amer 82 (*) >90 (mL/min)   TROPONIN I     Status: Normal   Collection Time   07/12/11  3:27 AM      Component Value Range Comment   Troponin I <0.30  <0.30 (ng/mL)    Dg Chest 2 View  07/12/2011  *RADIOLOGY REPORT*  Clinical Data: Chest pain.  CHEST - 2 VIEW  Comparison: None  Findings: There is moderate cardiomegaly.  Lungs clear.  No effusions.  No acute bony abnormality.  IMPRESSION: Cardiomegaly.  No active disease.  Original Report Authenticated By: Raelyn Number, M.D.   EKG:  A rate controlled Atrial fibrillation with an occasional PVC  Otherwise  No acute S-T segment changes.      Assessment: Present on Admission:  .Atrial fibrillation .Chest pain .Hypertension .Stented coronary artery .Coronary artery disease .Morbid obesity .Uterine fibroid .Hyperlipidemia   Plan:    Observation Status Telemetry Bed Cardiac Enzymes Nitropatch, O2, ASA therapy Reconcile Home Meds SSI coverage DVT prophylaxis Further workup.   Other plans as per orders.    CODE STATUS:      FULL CODE         Derian Dimalanta C 07/12/2011, 6:51 AM

## 2011-07-12 NOTE — ED Notes (Signed)
Pt states started having chest pain last pm at 11.  States had recent change in ekg x 3 weeks.  Was on blood thinners with a-fib.  Pt states came off blood thinner d/t reaction.  Now only on aspirin.  Pt completed ekg.  Pt with a-fib and rate in 80's.

## 2011-07-13 ENCOUNTER — Encounter (HOSPITAL_COMMUNITY)
Admission: EM | Disposition: A | Discharge status: Home / Self Care | Payer: Self-pay | Source: Ambulatory Visit | Attending: Internal Medicine

## 2011-07-13 ENCOUNTER — Encounter (HOSPITAL_COMMUNITY): Payer: Self-pay | Admitting: General Practice

## 2011-07-13 DIAGNOSIS — D259 Leiomyoma of uterus, unspecified: Secondary | ICD-10-CM

## 2011-07-13 HISTORY — PX: LEFT HEART CATHETERIZATION WITH CORONARY ANGIOGRAM: SHX5451

## 2011-07-13 HISTORY — DX: Leiomyoma of uterus, unspecified: D25.9

## 2011-07-13 LAB — GLUCOSE, CAPILLARY
Glucose-Capillary: 150 mg/dL — ABNORMAL HIGH (ref 70–99)
Glucose-Capillary: 99 mg/dL (ref 70–99)

## 2011-07-13 LAB — CBC
Hemoglobin: 10.8 g/dL — ABNORMAL LOW (ref 12.0–15.0)
MCH: 27.2 pg (ref 26.0–34.0)
MCHC: 32.8 g/dL (ref 30.0–36.0)
MCV: 82.9 fL (ref 78.0–100.0)
RBC: 3.97 MIL/uL (ref 3.87–5.11)

## 2011-07-13 LAB — LIPID PANEL
Cholesterol: 114 mg/dL (ref 0–200)
HDL: 33 mg/dL — ABNORMAL LOW (ref >39)
Total CHOL/HDL Ratio: 3.5 RATIO
Triglycerides: 78 mg/dL (ref <150)
VLDL: 16 mg/dL (ref 0–40)

## 2011-07-13 LAB — BASIC METABOLIC PANEL
BUN: 19 mg/dL (ref 6–23)
CO2: 29 mEq/L (ref 19–32)
Glucose, Bld: 127 mg/dL — ABNORMAL HIGH (ref 70–99)
Potassium: 3.1 mEq/L — ABNORMAL LOW (ref 3.5–5.1)
Sodium: 140 mEq/L (ref 135–145)

## 2011-07-13 LAB — TSH: TSH: 1.865 u[IU]/mL (ref 0.350–4.500)

## 2011-07-13 LAB — CARDIAC PANEL(CRET KIN+CKTOT+MB+TROPI)
CK, MB: 3.3 ng/mL (ref 0.3–4.0)
Total CK: 97 U/L (ref 7–177)

## 2011-07-13 LAB — POTASSIUM: Potassium: 3.7 mEq/L (ref 3.5–5.1)

## 2011-07-13 LAB — MAGNESIUM: Magnesium: 2 mg/dL (ref 1.5–2.5)

## 2011-07-13 SURGERY — LEFT HEART CATHETERIZATION WITH CORONARY ANGIOGRAM
Anesthesia: LOCAL

## 2011-07-13 MED ORDER — SODIUM CHLORIDE 0.9 % IV SOLN: 1.0000 mL/kg/h | INTRAVENOUS | Status: AC

## 2011-07-13 MED ORDER — NITROGLYCERIN 0.2 MG/ML ON CALL CATH LAB
INTRAVENOUS | Status: AC
  Filled 2011-07-13: qty 1

## 2011-07-13 MED ORDER — POTASSIUM CHLORIDE CRYS ER 20 MEQ PO TBCR
20.0000 meq | EXTENDED_RELEASE_TABLET | Freq: Once | ORAL | Status: DC
  Filled 2011-07-13: qty 1

## 2011-07-13 MED ORDER — MORPHINE SULFATE 2 MG/ML IJ SOLN: 2.0000 mg | INTRAMUSCULAR | Status: DC | PRN

## 2011-07-13 MED ORDER — SODIUM CHLORIDE 0.9 % IJ SOLN: 3.0000 mL | INTRAMUSCULAR | Status: DC | PRN

## 2011-07-13 MED ORDER — POTASSIUM CHLORIDE CRYS ER 20 MEQ PO TBCR
40.0000 meq | EXTENDED_RELEASE_TABLET | Freq: Once | ORAL | Status: AC
  Administered 2011-07-13: 40 meq via ORAL
  Filled 2011-07-13: qty 2

## 2011-07-13 MED ORDER — VALSARTAN 160 MG PO TABS
320.0000 mg | ORAL_TABLET | Freq: Every day | ORAL | Status: DC
  Filled 2011-07-13: qty 2

## 2011-07-13 MED ORDER — HEPARIN SODIUM (PORCINE) 1000 UNIT/ML IJ SOLN
INTRAMUSCULAR | Status: AC
  Filled 2011-07-13: qty 1

## 2011-07-13 MED ORDER — SODIUM CHLORIDE 0.9 % IJ SOLN: 3.0000 mL | Freq: Two times a day (BID) | INTRAMUSCULAR | Status: DC

## 2011-07-13 MED ORDER — HYDROCHLOROTHIAZIDE 25 MG PO TABS
25.0000 mg | ORAL_TABLET | Freq: Every day | ORAL | Status: DC
  Filled 2011-07-13 (×2): qty 1

## 2011-07-13 MED ORDER — POTASSIUM CHLORIDE CRYS ER 20 MEQ PO TBCR
20.0000 meq | EXTENDED_RELEASE_TABLET | Freq: Every day | ORAL | Status: DC
  Filled 2011-07-13 (×3): qty 1

## 2011-07-13 MED ORDER — LIDOCAINE HCL (PF) 1 % IJ SOLN
INTRAMUSCULAR | Status: AC
  Filled 2011-07-13: qty 30

## 2011-07-13 MED ORDER — VERAPAMIL HCL 2.5 MG/ML IV SOLN
INTRAVENOUS | Status: AC
  Filled 2011-07-13: qty 2

## 2011-07-13 MED ORDER — FENTANYL CITRATE 0.05 MG/ML IJ SOLN
INTRAMUSCULAR | Status: AC
  Filled 2011-07-13: qty 2

## 2011-07-13 MED ORDER — SODIUM CHLORIDE 0.9 % IV SOLN: 250.0000 mL | INTRAVENOUS | Status: DC

## 2011-07-13 MED ORDER — HEPARIN (PORCINE) IN NACL 2-0.9 UNIT/ML-% IJ SOLN
INTRAMUSCULAR | Status: AC
  Filled 2011-07-13: qty 2000

## 2011-07-13 MED ORDER — MIDAZOLAM HCL 2 MG/2ML IJ SOLN
INTRAMUSCULAR | Status: AC
  Filled 2011-07-13: qty 2

## 2011-07-13 MED ORDER — VALSARTAN 160 MG PO TABS
320.0000 mg | ORAL_TABLET | Freq: Every day | ORAL | Status: DC
  Administered 2011-07-13: 320 mg via ORAL
  Filled 2011-07-13 (×2): qty 2

## 2011-07-13 NOTE — Progress Notes (Signed)
TR band deflated and removed per protocol, area cleansed w/ CHG wipe, redressed w/ 2x2/tegaderm.  Verbal and written instructions reinforced w/ pt and husband.  Fingers warm, mobile, sensation intact. + reverse allens per pleth rt thumb

## 2011-07-13 NOTE — Progress Notes (Signed)
03:35-had 10 beats of V-tach-asleep & asymptomatic- strip of event placed in chart-will con't to monitor as instructed by PA Tenny Craw

## 2011-07-13 NOTE — Progress Notes (Signed)
Subjective: Chest pain free since yesterday.   Objective: Weight change:   Intake/Output Summary (Last 24 hours) at 07/13/11 1014 Last data filed at 07/13/11 0927  Gross per 24 hour  Intake 1286.4 ml  Output   1150 ml  Net  136.4 ml    Filed Vitals:   07/13/11 0508  BP: 124/76  Pulse: 59  Temp: 98.3 F (36.8 C)  Resp: 17   Pt is alert afebrile comfortable  cvs s1s2 heard  Lungs clear  Abdomen soft nontender  Extremities no pedal edema.   Lab Results: Results for orders placed during the hospital encounter of 07/12/11 (from the past 24 hour(s))  GLUCOSE, CAPILLARY     Status: Abnormal   Collection Time   07/12/11 12:03 PM      Component Value Range   Glucose-Capillary 121 (*) 70 - 99 (mg/dL)  GLUCOSE, CAPILLARY     Status: Abnormal   Collection Time   07/12/11  4:46 PM      Component Value Range   Glucose-Capillary 136 (*) 70 - 99 (mg/dL)  PROTIME-INR     Status: Normal   Collection Time   07/12/11  4:53 PM      Component Value Range   Prothrombin Time 13.0  11.6 - 15.2 (seconds)   INR 0.96  0.00 - 1.49   GLUCOSE, CAPILLARY     Status: Abnormal   Collection Time   07/12/11  9:55 PM      Component Value Range   Glucose-Capillary 128 (*) 70 - 99 (mg/dL)   Comment 1 Notify RN    BASIC METABOLIC PANEL     Status: Abnormal   Collection Time   07/13/11  4:24 AM      Component Value Range   Sodium 140  135 - 145 (mEq/L)   Potassium 3.1 (*) 3.5 - 5.1 (mEq/L)   Chloride 104  96 - 112 (mEq/L)   CO2 29  19 - 32 (mEq/L)   Glucose, Bld 127 (*) 70 - 99 (mg/dL)   BUN 19  6 - 23 (mg/dL)   Creatinine, Ser 0.75  0.50 - 1.10 (mg/dL)   Calcium 9.5  8.4 - 10.5 (mg/dL)   GFR calc non Af Amer 89 (*) >90 (mL/min)   GFR calc Af Amer >90  >90 (mL/min)  CBC     Status: Abnormal   Collection Time   07/13/11  4:24 AM      Component Value Range   WBC 5.4  4.0 - 10.5 (K/uL)   RBC 3.97  3.87 - 5.11 (MIL/uL)   Hemoglobin 10.8 (*) 12.0 - 15.0 (g/dL)   HCT 32.9 (*) 36.0 - 46.0 (%)   MCV 82.9  78.0 - 100.0 (fL)   MCH 27.2  26.0 - 34.0 (pg)   MCHC 32.8  30.0 - 36.0 (g/dL)   RDW 15.8 (*) 11.5 - 15.5 (%)   Platelets 214  150 - 400 (K/uL)  LIPID PANEL     Status: Abnormal   Collection Time   07/13/11  4:24 AM      Component Value Range   Cholesterol 114  0 - 200 (mg/dL)   Triglycerides 78  <150 (mg/dL)   HDL 33 (*) >39 (mg/dL)   Total CHOL/HDL Ratio 3.5     VLDL 16  0 - 40 (mg/dL)   LDL Cholesterol 65  0 - 99 (mg/dL)  GLUCOSE, CAPILLARY     Status: Abnormal   Collection Time   07/13/11  8:26 AM  Component Value Range   Glucose-Capillary 150 (*) 70 - 99 (mg/dL)     Micro Results: No results found for this or any previous visit (from the past 240 hour(s)).  Studies/Results: Dg Chest 2 View  07/12/2011  *RADIOLOGY REPORT*  Clinical Data: Chest pain.  CHEST - 2 VIEW  Comparison: None  Findings: There is moderate cardiomegaly.  Lungs clear.  No effusions.  No acute bony abnormality.  IMPRESSION: Cardiomegaly.  No active disease.  Original Report Authenticated By: Raelyn Number, M.D.   Medications: Scheduled Meds:   . sodium chloride   Intravenous STAT  . aspirin  324 mg Oral Pre-Cath  . aspirin EC  325 mg Oral Daily  . atorvastatin  10 mg Oral q1800  . cholecalciferol  2,000 Units Oral Daily  . diazepam  5 mg Oral On Call  . enoxaparin  40 mg Subcutaneous Q24H  . ezetimibe  10 mg Oral Daily  . furosemide  80 mg Oral Daily  . insulin aspart  0-15 Units Subcutaneous TID WC  . insulin aspart  0-5 Units Subcutaneous QHS  . irbesartan  300 mg Oral Daily  . isosorbide mononitrate  30 mg Oral Daily  . nebivolol  5 mg Oral Daily  . pneumococcal 23 valent vaccine  0.5 mL Intramuscular Tomorrow-1000  . potassium chloride  20 mEq Oral Once  . potassium chloride SA  20 mEq Oral Daily  . potassium chloride  40 mEq Oral Once  . sodium chloride  3 mL Intravenous Q12H  . sodium chloride  3 mL Intravenous Q12H  . DISCONTD: diazepam  5 mg Oral On Call  . DISCONTD:  furosemide  80 mg Oral Daily  . DISCONTD: hydrochlorothiazide  25 mg Oral Daily  . DISCONTD: potassium chloride SA  20 mEq Oral Daily  . DISCONTD: valsartan-hydrochlorothiazide  1 tablet Oral Daily   Continuous Infusions:   . sodium chloride 1 mL/kg/hr (07/12/11 2215)   PRN Meds:.sodium chloride, sodium chloride, acetaminophen, acetaminophen, allopurinol, alum & mag hydroxide-simeth, colchicine, HYDROmorphone, LORazepam, ondansetron (ZOFRAN) IV, ondansetron, oxyCODONE, sodium chloride, sodium chloride, zolpidem  Assessment/Plan:   1. Chest Pain:  Has a h/o CAD.only one troponin available and is negative.  Chest pain free since yesterday.  Going for Cardiac Cath this afternoon.  Continue with aspirin/nebivolol/ lipitor/ avapro.   2. Hypertension: controlled.   3. DM: hgba1c is 9. Currently on SSI. cbg's less than 200.   4. DVT Prophylaxis on lovenox.  5. Hypokalemia: replete as needed.        LOS: 1 day   Joann Jorge 07/13/2011, 10:14 AM

## 2011-07-13 NOTE — Progress Notes (Signed)
History and Physical Interval Note:  NAME:  Stephanie Franco   MRN: [id number] DOB:  June 03, 1950   ADMIT DATE: 07/12/2011  07/13/2011 10:47 AM  Stephanie Franco is a 61 y.o. female with a past medical history significant for CAD, s/p sten in 2005 in [facility], ([place] 7/05). She moved here about a year ago. She has been followed by Dr Karlton Lemon here. Apparently she has been in atrial fibrillation, (? Duration). She was tried on Pradaxa but had vaginal bleeding. Last night she had Lt sided chest pain that radiated to her Rt neck. She took a total of 4 NTG with eventual relief.  Based upon her risk factors and prior CAD/PCI - she is referred for cardiac catheterization as per my initial consultation note - signed by me this AM. She has remained chest pain free since arriving @ [facility] ER.  PHYSICAL EXAM:  BP 124/76  Pulse 59  Temp(Src) 98.3 F (36.8 C) (Oral)  Resp 17  Ht 5\' 3"  (1.6 m)  Wt 110.9 kg (244 lb 7.8 oz)  BMI 43.31 kg/m2  SpO2 97% General appearance: alert, cooperative and no distress  Neck: no adenopathy, no carotid bruit, no JVD, supple, symmetrical, trachea midline and thyroid not enlarged, symmetric, no tenderness/mass/nodules  Lungs: clear to auscultation bilaterally  Heart: irregularly irregular rhythm - 2/6 HSM heard loudest @ apex.  Abdomen: obese  Extremities: extremities normal, atraumatic, no cyanosis or edema  Pulses: 2+ and symmetric  Skin: Skin color, texture, turgor normal. No rashes or lesions; L mastectomy wounds well Healed.  Neurologic: Grossly normal; CN II-XII grossly intact  Bun/Cr : 19/0.75; K 3.1 -- repleted; H/H 10.8/32.9  IMPRESSION: Chest pain concerning for unstable angina; known CAD, new Dx of Atrial Fibrillation Cardiac RFs: DM, obesity, HLD, HTN, prior CAD/PCI  PLAN: Stephanie Franco has presented today for surgery, with the diagnosis of chest pain The various methods of treatment have been discussed with the patient and family. After consideration of  risks, benefits and other options for treatment, the patient has consented to Procedure(s):  LEFT HEART CATHETERIZATION AND CORONARY ANGIOGRAPHY +/- AD [place] as a surgical intervention.   The patients' history has been reviewed, patient examined, no change in status from most recent note, stable for surgery. I have reviewed the patients' chart and labs. Questions were answered to the patient's satisfaction.    [address] [place], [place]  [address]  [phone]  07/13/2011 10:47 AM

## 2011-07-13 NOTE — Brief Op Note (Signed)
07/12/2011 - 07/13/2011  6:22 PM  PATIENT:  Stephanie Franco  61 y.o. female with a past medical history significant for CAD, s/p sten in 2005 in [facility], ([place] 7/05). She moved here about a year ago. She has been followed by Dr Karlton Lemon here. Apparently she has been in atrial fibrillation, (? Duration). She was tried on Pradaxa but had vaginal bleeding. Last night she had Lt sided chest pain that radiated to her Rt neck. She took a total of 4 NTG with eventual relief.   Due to her risk factors, known CAD & run of ? VTach on telemetry, she was referred for coronary angiography +/- PCI.  PRE-OPERATIVE DIAGNOSIS:  [place]  POST-OPERATIVE DIAGNOSIS:   RCA - dominant, early mid at RV Marginal, tubular 50-60%, then distal ~40%; small caliber RPL system, moderate rPDA - no significant disease  LM - widely patent, large, bifrucates --> LAD, LCx  LAD - large caliber, tortuous vessel reaches around apex, several septal perforators, 1 major Diagonal that has an early branch; no significant disease  Left Circumflex: Large Caliber, tortuous (almost co-dominant); 2 OMs prior to AVGroove; stent @ OM2 patent with ~40% ISR, OM1 & 2 are moderate-large caliber with no significant disease; AVGroove Circ with 30-40% lesion before branching into terminal LPLs  LV Gram: EF ~50-55%, difficult to tell due to Afib, no clear WMA  Hemodynamics:   AoP:  141/76 mmHg; 102 mmHg  LVP:  138/9 mmHg; 19 mmHg   PROCEDURE:  Procedure(s) (LRB): LEFT HEART CATHETERIZATION WITH CORONARY ANGIOGRAM  Left Ventriculogram  SURGEON: Leonie Man, MD - Primary  ANESTHESIA:   local and IV sedation; 25mcg Fentanyl, 1 mg Versed  EBL: < 47ml  LOCAL MEDICATIONS USED:  LIDOCAINE 2 ml  MEDICATIONS:  Radial Cocktail: 5 mg Verapamil, 400 mcg NTG, 2 ml 2% Lidocaine   IV Heparin 5500 Units  CAHTETERS:  5Fr - JR4, JL 3.5, Angled Pigtail  TR BAND:  61ml Air, 1800  DICTATION: .Note written in paper chart and Note written in  EPIC  PLAN OF CARE: Admit for overnight observation; post radial cath care; add Imdur +/- Amlodipine pending BP eval Restart Metformin 48 hr post cath  PATIENT DISPOSITION:  PACU - hemodynamically stable.   Delay start of Pharmacological VTE agent (>24hrs) due to surgical blood loss or risk of bleeding: not applicable; will need to be on long term anticoagulation for Afib pending evaluation of Uterine Bleeding.  Leonie Man, M.D., M.S. [facility] [address] [place],   [address]  [phone]  07/13/2011 6:33 PM

## 2011-07-13 NOTE — Progress Notes (Signed)
Set pt up on nasal CPAP pressure of 10cmH2O with no O2 bled in. Pt wears CPAP at home. No complications noted. Pt states everything feels comfortable.

## 2011-07-14 ENCOUNTER — Encounter (HOSPITAL_COMMUNITY): Payer: Self-pay | Admitting: Cardiology

## 2011-07-14 DIAGNOSIS — E119 Type 2 diabetes mellitus without complications: Secondary | ICD-10-CM | POA: Diagnosis present

## 2011-07-14 LAB — GLUCOSE, CAPILLARY: Glucose-Capillary: 84 mg/dL (ref 70–99)

## 2011-07-14 MED ORDER — ISOSORBIDE MONONITRATE ER 30 MG PO TB24: 30.0000 mg | ORAL_TABLET | Freq: Every day | ORAL | Status: DC

## 2011-07-14 MED ORDER — LIVING WELL WITH DIABETES BOOK
Freq: Once | Status: AC
  Administered 2011-07-14: 13:00:00
  Filled 2011-07-14 (×2): qty 1

## 2011-07-14 MED ORDER — PANTOPRAZOLE SODIUM 40 MG PO TBEC: 40.0000 mg | DELAYED_RELEASE_TABLET | Freq: Every day | ORAL | Status: DC

## 2011-07-14 MED ORDER — ASPIRIN 325 MG PO TBEC: 325.0000 mg | DELAYED_RELEASE_TABLET | Freq: Every day | ORAL | Status: AC

## 2011-07-14 MED ORDER — FUROSEMIDE 80 MG PO TABS: 40.0000 mg | ORAL_TABLET | Freq: Every day | ORAL | Status: DC

## 2011-07-14 MED ORDER — LIVING WELL WITH DIABETES BOOK: 1.0000 | Freq: Once | Status: DC

## 2011-07-14 MED ORDER — PANTOPRAZOLE SODIUM 40 MG PO TBEC
40.0000 mg | DELAYED_RELEASE_TABLET | Freq: Every day | ORAL | Status: DC
  Administered 2011-07-14: 40 mg via ORAL
  Filled 2011-07-14: qty 1

## 2011-07-14 MED ORDER — VALSARTAN-HYDROCHLOROTHIAZIDE 320-25 MG PO TABS: 0.5000 | ORAL_TABLET | Freq: Every day | ORAL | Status: DC

## 2011-07-14 NOTE — Progress Notes (Addendum)
Subjective: No chest pain  Objective: Vital signs in last 24 hours: Temp:  [97.4 F (36.3 C)-98.3 F (36.8 C)] 98.1 F (36.7 C) (02/26 0528) Pulse Rate:  [63-85] 84  (02/26 0528) Resp:  [16-25] 18  (02/26 0528) BP: (103-153)/(58-82) 103/58 mmHg (02/26 0528) SpO2:  [94 %-97 %] 97 % (02/26 0528) Weight:  [112.3 kg (247 lb 9.2 oz)] 112.3 kg (247 lb 9.2 oz) (02/26 0528) Weight change: 1.124 kg (2 lb 7.6 oz) Last BM Date: 07/13/11 Intake/Output from previous day: 02/25 0701 - 02/26 0700 In: 21 [P.O.:460; I.V.:510] Out: 300 [Urine:300] Intake/Output this shift: Total I/O In: -  Out: 300 [Urine:300]  PE: General: A&O X 3 MAE 123XX123, RRR soft systolic murmur. Lungs:clear, without rales rhonchi or wheezes Abd:+ BS soft non tender [id number], rt. Wrist without hematoma, no numbness  Lab Results:  Basename 07/13/11 0424 07/12/11 0327  WBC 5.4 7.2  HGB 10.8* 11.8*  HCT 32.9* 36.0  PLT 214 203   BMET  Basename 07/13/11 1025 07/13/11 0424 07/12/11 0327  NA -- 140 136  K 3.7 3.1* --  CL -- 104 99  CO2 -- 29 24  GLUCOSE -- 127* 125*  BUN -- 19 28*  CREATININE -- 0.75 0.86  CALCIUM -- 9.5 9.4    Basename 07/13/11 1025 07/12/11 0327  TROPONINI <0.30 <0.30    Lab Results  Component Value Date   CHOL 114 07/13/2011   HDL 33* 07/13/2011   LDLCALC 65 07/13/2011   TRIG 78 07/13/2011   CHOLHDL 3.5 07/13/2011   Lab Results  Component Value Date   HGBA1C 9.0* 07/12/2011     Lab Results  Component Value Date   TSH 1.865 07/13/2011    Hepatic Function Panel  Basename 07/12/11 0327  PROT 7.0  ALBUMIN 3.8  AST 40*  ALT 36*  ALKPHOS 71  BILITOT 0.5  BILIDIR --  IBILI --    Basename 07/13/11 0424  CHOL 114   No results found for this basename: PROTIME in the last 72 hours    EKG: Orders placed in visit on 07/12/11  . EKG 12-LEAD    Studies/Results: Cardiac cath: POST-OPERATIVE DIAGNOSIS:  RCA - dominant, early mid at RV Marginal, tubular 50-60%, then  distal ~40%; small caliber RPL system, moderate rPDA - no significant disease  LM - widely patent, large, bifrucates --> LAD, LCx  LAD - large caliber, tortuous vessel reaches around apex, several septal perforators, 1 major Diagonal that has an early branch; no significant disease  Left Circumflex: Large Caliber, tortuous (almost co-dominant); 2 OMs prior to AVGroove; stent @ OM2 patent with ~40% ISR, OM1 & 2 are moderate-large caliber with no significant disease; AVGroove Circ with 30-40% lesion before branching into terminal LPLs  LV Gram: EF ~50-55%, difficult to tell due to Afib, no clear WMA  Hemodynamics:  AoP: 141/76 mmHg; 102 mmHg  LVP: 138/9 mmHg; 19 mmHg  Medications: I have reviewed the patient's current medications.    Marland Kitchen aspirin  324 mg Oral Pre-Cath  . aspirin EC  325 mg Oral Daily  . atorvastatin  10 mg Oral q1800  . cholecalciferol  2,000 Units Oral Daily  . diazepam  5 mg Oral On Call  . enoxaparin  40 mg Subcutaneous Q24H  . ezetimibe  10 mg Oral Daily  . fentaNYL      . furosemide  80 mg Oral Daily  . heparin      . heparin      . valsartan  320 mg Oral Daily   And  . hydrochlorothiazide  25 mg Oral Daily  . insulin aspart  0-15 Units Subcutaneous TID WC  . insulin aspart  0-5 Units Subcutaneous QHS  . isosorbide mononitrate  30 mg Oral Daily  . lidocaine      . midazolam      . nebivolol  5 mg Oral Daily  . nitroGLYCERIN      . pneumococcal 23 valent vaccine  0.5 mL Intramuscular Tomorrow-1000  . potassium chloride  20 mEq Oral Once  . potassium chloride SA  20 mEq Oral Daily  . potassium chloride  40 mEq Oral Once  . sodium chloride  3 mL Intravenous Q12H  . sodium chloride  3 mL Intravenous Q12H  . verapamil      . DISCONTD: irbesartan  300 mg Oral Daily  . DISCONTD: potassium chloride SA  20 mEq Oral Daily  . DISCONTD: sodium chloride  3 mL Intravenous Q12H  . DISCONTD: valsartan  320 mg Oral Daily   Assessment/Plan: Patient Active Problem List    Diagnoses  . Coronary artery disease, Stent placed in 2005 in [place], negative nuc 3 yrs ago  . Hypertension  . Atrial fibrillation, ? duration  . Chest pain  . Morbid obesity  . Uterine fibroid, (bleeding issues when she was put on Pradaxa for AF)  . Hyperlipidemia  . Unstable angina  . Sleep apnea, on C-Pap   PLAN:  Atrial fib.  Rate controlled, unsure how long, complicated by uterine bleeding secondary to fibroids, HgB stable, slightly lower today.  ? Resume pradaxa?  Check 2D Echo? Here or as out pt.?     Uncontrolled diabetes with HGBA1C of 9.0 on metformin, which is currently on hold.  Dietary consult prior to d/c --> but will defer to PMD for further management.   LOS: 2 days   Stephanie Franco,Stephanie Franco 07/14/2011, 8:01 AM  I have seen and examined the patient along with Cecilie Kicks, NP.  I have reviewed the chart, notes and new data.  I agree with Stephanie's note.  Key new complaints: no further CP, no Palpitations Key examination changes: agree with Stephanie's exam Key new findings / data: A1C reviewed.  PLAN:  Chest pain - would consider coronary spasm vs esophageal spasm as primary cause as Sx relieved with NTG; only moderate CAD on cath -- unable to explain angina.  Started Imdur,   Patent stent with mild-moderate ISR - continue ASA @ 325 until Pradaxa re-started.  Afib - rate controlled on BB.  Would hold off on Pradaxa pending OB Gyn w/u.  She told me that they were awaiting Cardiac evaluation -- with cath results, I recommend proceeding with Dx & Rx procedures as indicated & re-start Pradaxa once complete -- bleeding risk currently outwieghs risk of CVA, but would not want to wait too long.  Murmur is most likely MR - will order OP Echo prior to f/u (EF look OK on LV Gram)   BP on the low side today, hence monitoring o/n with new start Imdur.  May need to back off on Diovan dose until f/u (simply cut in half).  HLD - continue Lipito & Zetia  DM - not adequately controlled.  (Especially with obesity). Agree with Dietary/ Nutrition c/s prior to d/c.  Restart Metformin tomorrow.  Needs to ambulate in hall & if stable - fine for d/c after seen by Dietician. Will cc: D/c summary to PMD & OB Gyn (Dr. Lesle Chris)  Time  with patient & on discharge: MD - 20 min; NP - 26min  Lexee Brashears W, M.D., M.S. [facility] [address] [place], [place]  [address]  [phone]  07/14/2011 10:28 AM     '

## 2011-07-14 NOTE — Progress Notes (Signed)
Nutrition Consult:  Chart reviewed and patient's status noted.  Consult received for diet education related to diabetes.  Patient is currently on Carb Mod Medium diet.  Discharge planned for today.  Noted Diabetes Coordinator has already provided patient with information on outpatient follow up with [facility] for after discharge.  "Carbohydrate Counting for People with Diabetes" handout provided and reviewed with patient and her husband.  Contact information provided.  See Patient Education section.  Delma Post, [employer] Pager [phone]

## 2011-07-14 NOTE — Discharge Instructions (Signed)
DO NO TAKE METFORMIN UNTIL 07/15/11 PM  IT MAY INTERACT WITH CATH DYE.  Call [facility] if any bleeding, swelling or drainage at cath site.  May shower, no tub baths for 48 hours for groin sticks.   NO LIFTING FOR 1 WEEK WITH RT. ARM.  No driving for 3 days.  Our office will call you for an ultrasound of your heart. (2D Echo)  See your gynecologist to treat your fibroid.  You need anticoagulation once the fibroid is removed or treated.

## 2011-07-14 NOTE — Progress Notes (Signed)
Inpatient Diabetes Program Recommendations  AACE/ADA: New Consensus Statement on Inpatient Glycemic Control (2009)  Target Ranges:  Prepandial:   less than 140 mg/dL      Peak postprandial:   less than 180 mg/dL (1-2 hours)      Critically ill patients:  140 - 180 mg/dL   Reason for Visit: Consult due to elevated Hbg A1C  Inpatient Diabetes Program Recommendations HgbA1C: Hgb A1C was 9.0 indicating average glucose of 212 mg/dl Outpatient Referral: Suggested patient discuss request for OP Diabetes Education f/u as needed with PCP  Note: CBG's have generally been adequately controlled while in the hospital.  "Years ago" patient was put on Avandia to control diabetes, but was stopped due to risk associated with this particular medication.  No other medication was prescribed and blood sugars were generally "OK"  However, about 4 wks ago patient was started on Metformin because her glucose rose into the 400's.  It generally takes about 6 to 8 weeks to realize full effect of Metformin.  Patient plans to f/u closely with either Janne Napoleon, NP or Dr. Karlton Lemon regarding diabetes control.  Suggested that she consider asking for a referral to the [facility] if she has problems with self-care of diabetes.  Showed patient how to access the diabetes education videos in the Patient Education Network.  Ordered a diabetes booklet from pharmacy for patient.  Informed her regarding a meter and test strips that may be more cost-effective for her even though she has insurance. Patient and husband receptive to my visit and asked appropriate questions.  Patient seems to be taking self-control of diabetes very seriously.  Thank you for the consult.

## 2011-07-19 ENCOUNTER — Encounter (HOSPITAL_COMMUNITY): Payer: Self-pay | Admitting: Cardiology

## 2011-07-19 DIAGNOSIS — I25118 Atherosclerotic heart disease of native coronary artery with other forms of angina pectoris: Secondary | ICD-10-CM | POA: Diagnosis present

## 2011-07-19 NOTE — Discharge Summary (Signed)
I was the consulting physician for Mrs. Bottoms and performed her cardiac catheterization. Thankfully, she had non-obstructive CAD.  She should proceed with her evaluation and treatment of uterine fibroids.  Once treated, she should renew her anticoagulation therapy.  Leonie Man, M.D., M.S. [facility] [address] [place], [place]  [address]  [phone]  07/19/2011 6:26 PM

## 2011-07-19 NOTE — Discharge Summary (Signed)
Physician Discharge Summary  Patient ID: Stephanie Franco MRN: [id number] DOB/AGE: 01-27-1951 61 y.o.  Admit date: 07/12/2011 Discharge date: 07/14/2011 Discharge Diagnoses:  Principal Problem:  *Unstable angina, noncritical coronary artery disease by cardiac cath 07/13/11 Active Problems:  Coronary artery disease, Stent placed in 2005 in [place], negative nuc 3 yrs ago, nonobstructive coronaries by cath 07/13/11  Atrial fibrillation, ? duration  Morbid obesity  Uterine fibroid, (bleeding issues when she was put on Pradaxa for AF)  Hyperlipidemia  Sleep apnea, on C-Pap  Diabetes mellitus, uncontrolled  CAD (coronary artery disease), nonobstructive CAD by cath 07/13/11  Hypertension  Chest pain   Discharged Condition: good  Hospital Course: Stephanie Franco is an 61 y.o. female Who began to have 9/10 crampy left sided chest pain radiating into the right side that started at about 10pm on 07/12/11. Patient took 3 SL NTGs and had relief temporarily but had to take another NTG and then she was brought to the ED. She denied having SOB, nausea or vomiting or diaphoresis. She was found to have a rate controlled Atrial fibrillation, and reports that she was diagnosed with atrial fibrillation 3 weeks ago and was placed on Pradaxa but had to stop this medication due to vaginal bleeding from Uterine fibroids. She was sent to a Gyn (Dr. Garwin Brothers) for evaluation and options for treatment. She does have a history of Breast Cancer in 2008 and is S/P Mastectomy of the left breast. She had also been scheduled to see cardiology [facility]) as an outpatient in the upcoming weeks as well.    The patient was admitted to telemetry at [facility] and placed on nitroglycerin patch oxygen and aspirin therapy as well as her sliding scale insulin. She was seen in consultation by Dr. Glenetta Hew for chest pain. The night prior to the consult patient had had 2 runs of nonsustained ventricular tachycardia versus  atrial fibrillation with aberrancy. No further episodes were noted after her cardiac catheterization.  Patient's cardiac enzymes were negative for MI, but after consultation and patient's prior history of stent or coronary artery, Dr. Ellyn Hack felt cardiac catheterization would give [place] more information at this point. In the near future she needs treatment for the uterine fibroid the patient can be put on anticoagulation and eventually undergo cardioversion.  Patient underwent left heart cath from the right radial artery and was found to have nonobstructive coronary disease.  The previously placed stent at OM 2 was patent with approximately 40% in-stent restenosis. Her EF was 50-55%. Coronary spasm could be playing a part in her chest pain versus esophageal spasm. Imdur was started on the patient. Her blood pressure was somewhat low with adding into her so we decreased her Diovan.  Additionally do to her heart murmur which is most likely mitral regurgitation she will get outpatient 2-D echo to evaluate this.  She is competent [facility] after that cardiac catheterization and Dr. Ellyn Hack assumed her care.  By the next morning she was stable and ready for discharge home.  She ambulated without complications and her vital signs were stable.  Her diabetes was not well controlled and diabetes coordinator as well as Music therapist met with the patient and discussed diet and importance of diabetes control. Patient will follow with Dr. Karlton Lemon for further diabetes management.  Concerning her uterine fibroid, Dr. Ellyn Hack recommended proceeding with diagnosis and treatment procedures as indicated and then beginning Pradaxa when her treatment procedures were completed. Bleeding is currently outweighing risk of CVA but  he did not believe we should wait very long to get her back on anticoagulation.  Procedures: combined left heart catheterization 07/13/11 by Dr. Glenetta Hew.  Consults:  cardiology  Significant Diagnostic Studies: labs prior to discharge sodium 140 potassium which had been 3.1 was replaced and at discharge 3.7 chloride 104 CO2 29 BUN 19 creatinine 0.75 calcium 9.5 and using 2.0.  Cholesterol 114 triglycerides 78 HDL 33 LDL 65  Hemoglobin 10.8 hematocrit 32.9 CBC 5.4 platelets 214   TSH 1.865 Hemoglobin A1c 9.0  Two-view chest x-ray with cardiomegaly and clear lungs. EKG: Atrial fibrillation with PVC.  Cardiac cath:  POST-OPERATIVE DIAGNOSIS:  RCA - dominant, early mid at RV Marginal, tubular 50-60%, then distal ~40%; small caliber RPL system, moderate rPDA - no significant disease  LM - widely patent, large, bifrucates --> LAD, LCx  LAD - large caliber, tortuous vessel reaches around apex, several septal perforators, 1 major Diagonal that has an early branch; no significant disease  Left Circumflex: Large Caliber, tortuous (almost co-dominant); 2 OMs prior to AVGroove; stent @ OM2 patent with ~40% ISR, OM1 & 2 are moderate-large caliber with no significant disease; AVGroove Circ with 30-40% lesion before branching into terminal LPLs  LV Gram: EF ~50-55%, difficult to tell due to Afib, no clear WMA  Hemodynamics:  AoP: 141/76 mmHg; 102 mmHg  LVP: 138/9 mmHg; 19 mmHg    Discharge Exam: Blood pressure 103/58, pulse 66, temperature 98.1 F (36.7 C), temperature source Oral, resp. rate 18, height 5\' 3"  (1.6 m), weight 112.3 kg (247 lb 9.2 oz), SpO2 97.00%.  General: A&O X 3 MAE  [id number], RRR soft systolic murmur.  Lungs:clear, without rales rhonchi or wheezes  Abd:+ BS soft non tender  [id number], rt. Wrist without hematoma, no numbness      Disposition: 01-Home or Self Care   Medication List  As of 07/19/2011  5:43 PM   TAKE these medications         allopurinol 300 MG tablet   Commonly known as: ZYLOPRIM   Take 300 mg by mouth daily as needed. Gout      aspirin 325 MG EC tablet   Take 1 tablet (325 mg total) by mouth daily.       cholecalciferol 1000 UNITS tablet   Commonly known as: VITAMIN D   Take 2,000 Units by mouth daily.      colchicine 0.6 MG tablet   Take 0.6 mg by mouth daily as needed. gout      ezetimibe 10 MG tablet   Commonly known as: ZETIA   Take 10 mg by mouth daily.      furosemide 80 MG tablet   Commonly known as: LASIX   Take 0.5 tablets (40 mg total) by mouth daily.      isosorbide mononitrate 30 MG 24 hr tablet   Commonly known as: IMDUR   Take 1 tablet (30 mg total) by mouth daily.      living well with diabetes book Misc   1 each by Does not apply route once.      LORazepam 0.5 MG tablet   Commonly known as: ATIVAN   Take 0.5 mg by mouth every 6 (six) hours as needed. Anxiety      metFORMIN 750 MG 24 hr tablet   Commonly known as: GLUCOPHAGE-XR   Take 750 mg by mouth daily with breakfast.      nebivolol 5 MG tablet   Commonly known as: BYSTOLIC   Take 5 mg by  mouth daily.      pantoprazole 40 MG tablet   Commonly known as: PROTONIX   Take 1 tablet (40 mg total) by mouth daily at 12 noon.      potassium chloride SA 20 MEQ tablet   Commonly known as: K-DUR,KLOR-CON   Take 20 mEq by mouth daily.      rosuvastatin 10 MG tablet   Commonly known as: CRESTOR   Take 10 mg by mouth daily.      valsartan-hydrochlorothiazide 320-25 MG per tablet   Commonly known as: DIOVAN-HCT   Take 0.5 tablets by mouth daily.           Follow-up Information    Follow up with COUSINS,SHERONETTE A, MD. (call for appointment, to be seen in 1-2 weeks.)    Contact information:   [address] [place] [phone]       Follow up with Leonie Man, MD. (The office will call with date and time of appointment.)    Contact information:   [facility] [address] [place] [phone]         Discharge instructions:DO NO TAKE METFORMIN UNTIL 07/15/11 PM  IT MAY INTERACT WITH CATH DYE.  Call  [facility] if any bleeding, swelling or drainage at cath site.  May shower, no tub baths for 48 hours for groin sticks.   NO LIFTING FOR 1 WEEK WITH RT. ARM.  No driving for 3 days.  Our office will call you for an ultrasound of your heart. (2D Echo)  See your gynecologist to treat your fibroid.  You need anticoagulation once the fibroid is removed or treated.      SignedIsaiah Serge 07/19/2011, 5:43 PM

## 2011-09-02 ENCOUNTER — Encounter (HOSPITAL_COMMUNITY): Payer: Self-pay | Admitting: Pharmacist

## 2011-09-11 ENCOUNTER — Other Ambulatory Visit: Payer: Self-pay | Admitting: Obstetrics and Gynecology

## 2011-09-15 ENCOUNTER — Encounter (HOSPITAL_COMMUNITY)
Admission: RE | Admit: 2011-09-15 | Discharge: 2011-09-15 | Disposition: A | Discharge status: Home / Self Care | Payer: 59 | Source: Ambulatory Visit | Attending: Obstetrics and Gynecology | Admitting: Obstetrics and Gynecology

## 2011-09-15 ENCOUNTER — Encounter (HOSPITAL_COMMUNITY): Payer: Self-pay

## 2011-09-15 LAB — CBC
MCH: 26 pg (ref 26.0–34.0)
MCHC: 30.8 g/dL (ref 30.0–36.0)
Platelets: 183 10*3/uL (ref 150–400)
RBC: 4.43 MIL/uL (ref 3.87–5.11)
RDW: 15.7 % — ABNORMAL HIGH (ref 11.5–15.5)

## 2011-09-15 LAB — BASIC METABOLIC PANEL
Calcium: 9.9 mg/dL (ref 8.4–10.5)
GFR calc Af Amer: 71 mL/min — ABNORMAL LOW (ref >90)
GFR calc non Af Amer: 61 mL/min — ABNORMAL LOW (ref >90)
Sodium: 143 mEq/L (ref 135–145)

## 2011-09-15 NOTE — Patient Instructions (Addendum)
[place]  09/15/2011   Your procedure is scheduled on:  09/24/11  Enter through the Main Entrance of [facility] up the phone at the desk and dial [phone].   Call this number if you have problems the morning of surgery: [phone]   Remember:   Do not eat food:After Midnight.  Do not drink clear liquids: After Midnight.  Take these medicines the morning of surgery with A SIP OF WATER: Hold Lasix, hold Metformin for 24hrs prior to surgery, take all blood pressure/cardiac medications, take Protonix, bring Inhaler and pressure settings for CPAP.   Do not wear jewelry, make-up or nail polish.  Do not wear lotions, powders, or perfumes. You may wear deodorant.  Do not shave 48 hours prior to surgery.  Do not bring valuables to the hospital.  Contacts, dentures or bridgework may not be worn into surgery.  Leave suitcase in the car. After surgery it may be brought to your room.  For patients admitted to the hospital, checkout time is 11:00 AM the day of discharge.   Patients discharged the day of surgery will not be allowed to drive home.  Name and phone number of your driver: NA  Special Instructions: CHG Shower Use Special Wash: 1/2 bottle night before surgery and 1/2 bottle morning of surgery.   Please read over the following fact sheets that you were given: Surgical Site Infection Prevention

## 2011-09-15 NOTE — Pre-Procedure Instructions (Signed)
Cardiac history and testing results reviewed and accepted by Dr. Royce Macadamia. No further orders given.  Audrea Muscat, RN

## 2011-09-24 ENCOUNTER — Encounter (HOSPITAL_COMMUNITY): Payer: Self-pay | Admitting: *Deleted

## 2011-09-24 ENCOUNTER — Encounter (HOSPITAL_COMMUNITY): Payer: Self-pay | Admitting: Anesthesiology

## 2011-09-24 ENCOUNTER — Ambulatory Visit (HOSPITAL_COMMUNITY): Payer: 59 | Admitting: Anesthesiology

## 2011-09-24 ENCOUNTER — Encounter (HOSPITAL_COMMUNITY)
Admission: RE | Disposition: A | Discharge status: Home / Self Care | Payer: Self-pay | Source: Ambulatory Visit | Attending: Obstetrics and Gynecology

## 2011-09-24 ENCOUNTER — Ambulatory Visit (HOSPITAL_COMMUNITY)
Admission: RE | Admit: 2011-09-24 | Discharge: 2011-09-24 | Disposition: A | Discharge status: Home / Self Care | Payer: 59 | Source: Ambulatory Visit | Attending: Obstetrics and Gynecology | Admitting: Obstetrics and Gynecology

## 2011-09-24 DIAGNOSIS — N950 Postmenopausal bleeding: Secondary | ICD-10-CM | POA: Insufficient documentation

## 2011-09-24 DIAGNOSIS — D250 Submucous leiomyoma of uterus: Secondary | ICD-10-CM | POA: Insufficient documentation

## 2011-09-24 LAB — GLUCOSE, CAPILLARY: Glucose-Capillary: 124 mg/dL — ABNORMAL HIGH (ref 70–99)

## 2011-09-24 LAB — MAGNESIUM: Magnesium: 1.6 mg/dL (ref 1.5–2.5)

## 2011-09-24 LAB — BASIC METABOLIC PANEL
CO2: 29 mEq/L (ref 19–32)
Calcium: 9.4 mg/dL (ref 8.4–10.5)
Creatinine, Ser: 0.85 mg/dL (ref 0.50–1.10)

## 2011-09-24 LAB — CARDIAC PANEL(CRET KIN+CKTOT+MB+TROPI)
Relative Index: 2.1 (ref 0.0–2.5)
Total CK: 119 U/L (ref 7–177)

## 2011-09-24 SURGERY — DILATATION & CURETTAGE/HYSTEROSCOPY WITH VERSAPOINT RESECTION
Anesthesia: Spinal | Wound class: Clean Contaminated

## 2011-09-24 MED ORDER — POTASSIUM CHLORIDE CRYS ER 20 MEQ PO TBCR: 20.0000 meq | EXTENDED_RELEASE_TABLET | Freq: Once | ORAL | Status: DC

## 2011-09-24 MED ORDER — FENTANYL CITRATE 0.05 MG/ML IJ SOLN
INTRAMUSCULAR | Status: AC
  Administered 2011-09-24: 50 ug via INTRAVENOUS
  Filled 2011-09-24: qty 2

## 2011-09-24 MED ORDER — ONDANSETRON HCL 4 MG/2ML IJ SOLN
INTRAMUSCULAR | Status: DC | PRN
  Administered 2011-09-24: 4 mg via INTRAVENOUS

## 2011-09-24 MED ORDER — MORPHINE SULFATE 0.5 MG/ML IJ SOLN
INTRAMUSCULAR | Status: AC
  Filled 2011-09-24: qty 10

## 2011-09-24 MED ORDER — EPHEDRINE SULFATE 50 MG/ML IJ SOLN
INTRAMUSCULAR | Status: DC | PRN
  Administered 2011-09-24: 5 mg via INTRAVENOUS

## 2011-09-24 MED ORDER — SODIUM CHLORIDE 0.9 % IR SOLN
Status: DC | PRN
  Administered 2011-09-24: 3000 mL

## 2011-09-24 MED ORDER — LIDOCAINE HCL (CARDIAC) 20 MG/ML IV SOLN
INTRAVENOUS | Status: DC | PRN
  Administered 2011-09-24: 10 mg via INTRAVENOUS

## 2011-09-24 MED ORDER — ASPIRIN 81 MG PO CHEW
CHEWABLE_TABLET | ORAL | Status: DC | PRN
  Administered 2011-09-24: 324 mg via ORAL

## 2011-09-24 MED ORDER — PROPOFOL 10 MG/ML IV BOLUS
INTRAVENOUS | Status: DC | PRN
  Administered 2011-09-24: 10 mg via INTRAVENOUS
  Administered 2011-09-24: 20 mg via INTRAVENOUS

## 2011-09-24 MED ORDER — ATROPINE SULFATE 0.1 MG/ML IJ SOLN: INTRAMUSCULAR | Status: DC | PRN

## 2011-09-24 MED ORDER — FENTANYL CITRATE 0.05 MG/ML IJ SOLN: INTRAMUSCULAR | Status: DC | PRN

## 2011-09-24 MED ORDER — MORPHINE SULFATE 10 MG/ML IJ SOLN
INTRAMUSCULAR | Status: AC
  Filled 2011-09-24: qty 1

## 2011-09-24 MED ORDER — MIDAZOLAM HCL 2 MG/2ML IJ SOLN
INTRAMUSCULAR | Status: AC
  Filled 2011-09-24: qty 2

## 2011-09-24 MED ORDER — METOCLOPRAMIDE HCL 5 MG/ML IJ SOLN: 10.0000 mg | Freq: Once | INTRAMUSCULAR | Status: DC | PRN

## 2011-09-24 MED ORDER — POTASSIUM CHLORIDE CRYS ER 20 MEQ PO TBCR
20.0000 meq | EXTENDED_RELEASE_TABLET | Freq: Once | ORAL | Status: AC
  Administered 2011-09-24: 20 meq via ORAL
  Filled 2011-09-24: qty 1

## 2011-09-24 MED ORDER — LIDOCAINE IN DEXTROSE 5-7.5 % IV SOLN
INTRAVENOUS | Status: DC | PRN
  Administered 2011-09-24: 1 mL via INTRATHECAL

## 2011-09-24 MED ORDER — ASPIRIN 81 MG PO CHEW
324.0000 mg | CHEWABLE_TABLET | Freq: Once | ORAL | Status: DC
  Filled 2011-09-24: qty 4

## 2011-09-24 MED ORDER — LIDOCAINE IN DEXTROSE 5-7.5 % IV SOLN
INTRAVENOUS | Status: AC
  Filled 2011-09-24: qty 2

## 2011-09-24 MED ORDER — FENTANYL CITRATE 0.05 MG/ML IJ SOLN
25.0000 ug | INTRAMUSCULAR | Status: DC | PRN
  Administered 2011-09-24: 50 ug via INTRAVENOUS

## 2011-09-24 MED ORDER — GLYCINE 1.5 % IR SOLN
Status: DC | PRN
  Administered 2011-09-24: 1

## 2011-09-24 MED ORDER — FENTANYL CITRATE 0.05 MG/ML IJ SOLN
INTRAMUSCULAR | Status: AC
  Filled 2011-09-24: qty 2

## 2011-09-24 MED ORDER — CHLOROPROCAINE HCL 1 % IJ SOLN
INTRAMUSCULAR | Status: DC | PRN
  Administered 2011-09-24: 10 mL

## 2011-09-24 MED ORDER — CHLOROPROCAINE HCL 1 % IJ SOLN
INTRAMUSCULAR | Status: AC
Start: 2011-09-24 — End: 2011-09-24
  Filled 2011-09-24: qty 30

## 2011-09-24 MED ORDER — MIDAZOLAM HCL 5 MG/ML IJ SOLN
INTRAMUSCULAR | Status: DC | PRN
  Administered 2011-09-24 (×2): 1 mg via INTRAVENOUS

## 2011-09-24 MED ORDER — LACTATED RINGERS IV SOLN
INTRAVENOUS | Status: DC
  Administered 2011-09-24: 08:00:00 via INTRAVENOUS

## 2011-09-24 SURGICAL SUPPLY — 17 items
CANISTER SUCTION 2500CC (MISCELLANEOUS) ×2 IMPLANT
CATH ROBINSON RED A/P 16FR (CATHETERS) ×2 IMPLANT
CLOTH BEACON ORANGE TIMEOUT ST (SAFETY) ×2 IMPLANT
CONTAINER PREFILL 10% NBF 60ML (FORM) ×4 IMPLANT
ELECT REM PT RETURN 9FT ADLT (ELECTROSURGICAL) ×2
ELECTRODE REM PT RTRN 9FT ADLT (ELECTROSURGICAL) ×1 IMPLANT
ELECTRODE ROLLER VERSAPOINT (ELECTRODE) IMPLANT
ELECTRODE RT ANGLE VERSAPOINT (CUTTING LOOP) IMPLANT
GLOVE BIO SURGEON STRL SZ 6.5 (GLOVE) ×2 IMPLANT
GLOVE BIOGEL PI IND STRL 7.0 (GLOVE) ×2 IMPLANT
GLOVE BIOGEL PI INDICATOR 7.0 (GLOVE) ×2
GOWN PREVENTION PLUS LG XLONG (DISPOSABLE) ×4 IMPLANT
GOWN STRL REIN XL XLG (GOWN DISPOSABLE) ×2 IMPLANT
LOOP ANGLED CUTTING 22FR (CUTTING LOOP) IMPLANT
PACK HYSTEROSCOPY LF (CUSTOM PROCEDURE TRAY) ×2 IMPLANT
TOWEL OR 17X24 6PK STRL BLUE (TOWEL DISPOSABLE) ×4 IMPLANT
WATER STERILE IRR 1000ML POUR (IV SOLUTION) ×2 IMPLANT

## 2011-09-24 NOTE — Anesthesia Preprocedure Evaluation (Signed)
Anesthesia Evaluation  Patient identified by MRN, date of birth, ID band Patient awake    Reviewed: Allergy & Precautions, H&P , NPO status , Patient's Chart, lab work & pertinent test results, reviewed documented beta blocker date and time   History of Anesthesia Complications Negative for: history of anesthetic complications  Airway Mallampati: III TM Distance: >3 FB Neck ROM: full    Dental  (+) Teeth Intact   Pulmonary shortness of breath and with exertion, asthma , sleep apnea, Continuous Positive Airway Pressure Ventilation and Oxygen sleep apnea , former smoker (quit 15-20 years ago) breath sounds clear to auscultation        Cardiovascular Exercise Tolerance: Good hypertension, Pt. on medications + angina (cath in 2/13 showed nonobstructive CAD, no chest pain since that time) + CAD and + Cardiac Stents (2005) + dysrhythmias Atrial Fibrillation Rhythm:irregular Rate:Normal     Neuro/Psych Anxiety negative neurological ROS     GI/Hepatic Neg liver ROS, GERD-  Medicated,  Endo/Other  Diabetes mellitus-, Type 2, Oral Hypoglycemic AgentsMorbid obesity  Renal/GU negative Renal ROS  Female GU complaint (PMB)  negative genitourinary   Musculoskeletal  (+) Arthritis - (gout, feet mostly, feels she is having a flare in left foot),   Abdominal   Peds negative pediatric ROS (+)  Hematology Breast cancer, s/p mastectomy - no chemo or XRT   Anesthesia Other Findings   Reproductive/Obstetrics negative OB ROS                           Anesthesia Physical Anesthesia Plan  ASA: III  Anesthesia Plan: Spinal   Post-op Pain Management:    Induction:   Airway Management Planned:   Additional Equipment:   Intra-op Plan:   Post-operative Plan:   Informed Consent: I have reviewed the patients History and Physical, chart, labs and discussed the procedure including the risks, benefits and  alternatives for the proposed anesthesia with the patient or authorized representative who has indicated his/her understanding and acceptance.   Dental Advisory Given  Plan Discussed with: Surgeon and CRNA  Anesthesia Plan Comments:         Anesthesia Quick Evaluation

## 2011-09-24 NOTE — Brief Op Note (Signed)
09/24/2011  10:20 AM  PATIENT:  Stephanie Franco  61 y.o. female  PRE-OPERATIVE DIAGNOSIS:  Postmenopausal Bleeding, Fibroids  POST-OPERATIVE DIAGNOSIS:  Postmenopausal Bleeding, SUBMUCOSAL  Fibroids  PROCEDURE:  Procedure(s) (LRB): DILATATION & CURETTAGE/DIAGNOSTIC HYSTEROSCOPY WITH VERSAPOINT RESECTION OF SUBMUCOSAL FIBROIDS (N/A)  SURGEON:  Surgeon(s) and Role:    * Nayel Purdy Clint Bolder, MD - Primary  PHYSICIAN ASSISTANT:   ASSISTANTS: none   ANESTHESIA:   spinal, PARACERVICAL BLOCK  EBL: MINIMAL  Total I/O In: 700 [I.V.:700] Out: -  FINDINGS: SUBMUCOSAL FIBROIDS, ATROPHIC ENDOM SCLEROSED TUBAL OSTIA  BLOOD ADMINISTERED:none  DRAINS: none   LOCAL MEDICATIONS USED:  OTHER nesicaine  SPECIMEN:  Source of Specimen:  emc, fibroid resection  DISPOSITION OF SPECIMEN:  PATHOLOGY  COUNTS:  YES  TOURNIQUET:  * No tourniquets in log *  DICTATION: .Other Dictation: Dictation Number [id number]  PLAN OF CARE: Discharge to home after PACU  PATIENT DISPOSITION:  PACU - hemodynamically stable.   Delay start of Pharmacological VTE agent (>24hrs) due to surgical blood loss or risk of bleeding: no

## 2011-09-24 NOTE — Anesthesia Postprocedure Evaluation (Signed)
  Anesthesia Post-op Note  Patient: Stephanie Franco  Procedure(s) Performed: Procedure(s) (LRB): DILATATION & CURETTAGE/HYSTEROSCOPY WITH VERSAPOINT RESECTION (N/A)   Patient is awake, responsive, moving her legs, and has signs of resolution of her numbness. Pain and nausea are reasonably well controlled. Vital signs are stable and clinically acceptable. Cardiac enzymes are neg. Oxygen saturation is clinically acceptable. There are no apparent anesthetic complications at this time. Patient is ready for discharge.

## 2011-09-24 NOTE — Transfer of Care (Signed)
Immediate Anesthesia Transfer of Care Note  Patient: Stephanie Franco  Procedure(s) Performed: Procedure(s) (LRB): DILATATION & CURETTAGE/HYSTEROSCOPY WITH VERSAPOINT RESECTION (N/A)  Patient Location: PACU  Anesthesia Type: Spinal  Level of Consciousness: awake, alert  and oriented  Airway & Oxygen Therapy: Patient Spontanous Breathing and Patient connected to nasal cannula oxygen  Post-op Assessment: Report given to PACU RN and Post -op Vital signs reviewed and stable  Post vital signs: Reviewed and stable  Complications: No apparent anesthesia complications2

## 2011-09-24 NOTE — Discharge Instructions (Signed)
CALL  IF TEMP>100.4, NOTHING PER VAGINA X 2 WK, CALL IF SOAKING A MAXI  PAD EVERY HOUR OR MORE FREQUENTLY DISCHARGE INSTRUCTIONS: HYSTEROSCOPY / ENDOMETRIAL ABLATION The following instructions have been prepared to help you care for yourself upon your return home.  Personal hygiene: Marland Kitchen Use sanitary pads for vaginal drainage, not tampons. . Shower the day after your procedure. . NO tub baths, pools or Jacuzzis for 2-3 weeks. . Wipe front to back after using the bathroom.  Activity and limitations: . Do NOT drive or operate any equipment for 24 hours. The effects of anesthesia are still present and drowsiness may result. . Do NOT rest in bed all day. . Walking is encouraged. . Walk up and down stairs slowly. . You may resume your normal activity in one to two days or as indicated by your physician. Sexual activity: NO intercourse for at least 2 weeks after the procedure, or as indicated by your Doctor.  Diet: Eat a light meal as desired this evening. You may resume your usual diet tomorrow.  Return to Work: You may resume your work activities in one to two days or as indicated by Marine scientist.  What to expect after your surgery: Expect to have vaginal bleeding/discharge for 2-3 days and spotting for up to 10 days. It is not unusual to have soreness for up to 1-2 weeks. You may have a slight burning sensation when you urinate for the first day. Mild cramps may continue for a couple of days. You may have a regular period in 2-6 weeks.  Call your doctor for any of the following: . Excessive vaginal bleeding or clotting, saturating and changing one pad every hour. . Inability to urinate 6 hours after discharge from hospital. . Pain not relieved by pain medication. . Fever of 100.4 F or greater. . Unusual vaginal discharge or odor.  Return to office _________________Call for an appointment ___________________ Patient's signature: ______________________ Nurse's signature  ________________________  [facility] [phone]

## 2011-09-24 NOTE — Anesthesia Procedure Notes (Signed)
Spinal  Patient location during procedure: OR Start time: 09/24/2011 8:55 AM Staffing Performed by: anesthesiologist  Preanesthetic Checklist Completed: patient identified, site marked, surgical consent, pre-op evaluation, timeout performed, IV checked, risks and benefits discussed and monitors and equipment checked Spinal Block Patient position: sitting Prep: DuraPrep Patient monitoring: continuous pulse ox and heart rate Approach: midline Location: L3-4 Injection technique: single-shot Needle Needle type: Tuohy and Pencan  Needle gauge: 25 G Needle length: 9 cm Needle insertion depth: 6 cm Assessment Sensory level: T4 Additional Notes Successful on fourth attempt.  Bone encountered and needles bent with sprotte x1, and pencan x2.  Successful with first attempt with tuohy.  LOR to air at 6 cm.  Pencan spinal needle via tuohy with immediate free flow CSF.  Dose given.  No paresthesia.  Patient tolerated procedure well.  Charlton Haws, MD

## 2011-09-25 NOTE — Op Note (Addendum)
Stephanie Franco, Stephanie Franco              ACCOUNT NO.:  [id number]  MEDICAL RECORD NO.:  [id number]  LOCATION:  [facility]                          FACILITY:  [facility]  PHYSICIAN:  Servando Salina, M.D.DATE OF BIRTH:  08-24-1950  DATE OF PROCEDURE:  09/24/2011 DATE OF DISCHARGE:  09/24/2011                              OPERATIVE REPORT   PREOPERATIVE DIAGNOSES: 1. Postmenopausal bleeding. 2. Uterine fibroids.  PROCEDURES:  Diagnostic hysteroscopy, hysteroscopic resection of submucosal fibroid using VersaPoint, dilation and curettage.  POSTOPERATIVE DIAGNOSES: 1. Postmenopausal bleeding. 2. Submucosal fibroids.  ANESTHESIA:  Spinal, paracervical block.  SURGEON:  Servando Salina, MD  ASSISTANT:  None.  PROCEDURE:  Under adequate spinal anesthesia, the patient was placed in the dorsal lithotomy position.  She was sterilely prepped and draped in usual fashion.  Bladder was catheterized for large amount of urine. Examination under anesthesia revealed an anteverted uterus and irregular.  No adnexal masses could be appreciated but limited by the patient's body habitus.  A bivalve speculum placed in the vagina, 10 mL of 1% Nesacaine was injected paracervically at 3 and 9 o'clock position. The anterior lip of the cervix was grasped with a single-tooth tenaculum.  The patient has an old cervical laceration on the posterior lip of the cervix.  The cervix easily accepted a #25 Pratt dilator.  A diagnostic hysteroscope was introduced into the uterine cavity.  The tubal ostia could not be seen.  The cavity was distorted.  It also had 2 submucosal fibroids right on the right lateral wall, 1 was on the posterior wall and 1 to the left anterior cornual region.  The diagnostic hysteroscope was removed.  The cervix was then further dilated up to a #31 Pratt dilator, and the VersaPoint hysteroscope was then inserted.  The both lesions were resected without incident.  The resectoscope was removed.  The  forceps clamp was then used to grasp the fibroids segments.  The cavity was then gently curetted, and the resectoscope was inserted.  Additional resection was performed on the right side.  No other lesions were subsequently noted.  The lower segment, however, had the cavity distorted but no defined fibroid at that level.  At that point, the resectoscope was removed, the cavity was once again curetted, and then all instruments were removed from the vagina with the completion of the procedure.  SPECIMEN:  Endometrial curetting and fibroid resection, sent to pathology.  ESTIMATED BLOOD LOSS:  Minimal.  FLUID DEFICIT:  500 mL normal saline.  COMPLICATION:  None.  Intraoperatively, the patient complained of some chest discomfort, which was managed by the anesthesiologist.  The patient tolerated the procedure well and was transferred to recovery room in stable condition.     Servando Salina, M.D.     [place]/MED[facility]  D:  09/24/2011  T:  09/25/2011  Job:  [id number]

## 2012-03-07 ENCOUNTER — Encounter (HOSPITAL_COMMUNITY): Payer: Self-pay | Admitting: Emergency Medicine

## 2012-03-07 ENCOUNTER — Emergency Department (HOSPITAL_COMMUNITY)
Admission: EM | Admit: 2012-03-07 | Discharge: 2012-03-07 | Disposition: A | Discharge status: Home / Self Care | Payer: 59 | Attending: Emergency Medicine | Admitting: Emergency Medicine

## 2012-03-07 DIAGNOSIS — IMO0001 Reserved for inherently not codable concepts without codable children: Secondary | ICD-10-CM | POA: Insufficient documentation

## 2012-03-07 DIAGNOSIS — K089 Disorder of teeth and supporting structures, unspecified: Secondary | ICD-10-CM | POA: Insufficient documentation

## 2012-03-07 DIAGNOSIS — K0889 Other specified disorders of teeth and supporting structures: Secondary | ICD-10-CM

## 2012-03-07 DIAGNOSIS — I10 Essential (primary) hypertension: Secondary | ICD-10-CM | POA: Insufficient documentation

## 2012-03-07 DIAGNOSIS — R220 Localized swelling, mass and lump, head: Secondary | ICD-10-CM | POA: Insufficient documentation

## 2012-03-07 DIAGNOSIS — I2510 Atherosclerotic heart disease of native coronary artery without angina pectoris: Secondary | ICD-10-CM | POA: Insufficient documentation

## 2012-03-07 DIAGNOSIS — Z79899 Other long term (current) drug therapy: Secondary | ICD-10-CM | POA: Insufficient documentation

## 2012-03-07 DIAGNOSIS — Z87891 Personal history of nicotine dependence: Secondary | ICD-10-CM | POA: Insufficient documentation

## 2012-03-07 DIAGNOSIS — Z853 Personal history of malignant neoplasm of breast: Secondary | ICD-10-CM | POA: Insufficient documentation

## 2012-03-07 MED ORDER — HYDROCODONE-ACETAMINOPHEN 5-325 MG PO TABS: 1.0000 | ORAL_TABLET | ORAL | Status: DC | PRN

## 2012-03-07 MED ORDER — PENICILLIN V POTASSIUM 500 MG PO TABS: 500.0000 mg | ORAL_TABLET | Freq: Three times a day (TID) | ORAL | Status: DC

## 2012-03-07 NOTE — ED Notes (Signed)
Pt presenting to ed with c/o right side upper toothache pain

## 2012-03-07 NOTE — ED Notes (Signed)
Pt states she does not have dental insurance till Jan but has medical insurance, states her husband was sent to oral surgeon by this ED and it was covered under the medical insurance and she wants the same, having R sided tooth pain 10/10, sitting on bed in no distress.

## 2012-03-07 NOTE — ED Provider Notes (Signed)
History     CSN: [id number]  Arrival date & time 03/07/12  [id number]   First MD Initiated Contact with Patient 03/07/12 [phone]      Chief Complaint  Patient presents with  . Dental Pain    (Consider location/radiation/quality/duration/timing/severity/associated sxs/prior treatment) HPI  61 year old female with history of CAD, diabetes, and atrial fibrillation presents complaining of dental pain. Patient reports she has had recurrent pain to the right upper tooth for nearly a year. Pain usually only lasts for a few hours and improved with ibuprofen. Since yesterday she has had persistent pain to the same tooth. She described pain as a throbbing and sharp sensation felt like this radiated up to her face. Pain worsening with cold air and pressure. Pain not improve with ibuprofen and Orajel. She denies fever, rash, vision changes, hearing changes, sore throat, chest pain, shortness of breath, or rash.  Denies any recent trauma.  Past Medical History  Diagnosis Date  . Coronary artery disease   . Stented coronary artery   . Hypertension   . Diabetes mellitus   . Hyperlipidemia   . Morbid obesity   . Breast cancer 2008    S/P mastectomy  . Atrial fibrillation   . Angina   . Asthma   . 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 Sleep apnea   . Gout   . Diabetes mellitus, uncontrolled 07/14/2011  . CAD (coronary artery disease), nonobstructive CAD by cath 07/13/11 07/19/2011    Past Surgical History  Procedure Date  . Coronary angioplasty with stent placement 2005    "1"  . Cardiac catheterization 07/13/11  . Mastectomy 2008    left    Family History  Problem Relation Age of Onset  . Coronary artery disease Mother   . Hypertension Mother   . Atrial fibrillation Son     History  Substance Use Topics  . Smoking status: Former Smoker -- 0.5 packs/day for 6 years    Types: Cigarettes    Quit date: 05/18/1992  . Smokeless tobacco: Never Used  . Alcohol Use: No     07/13/11  "have drank occasionally; not now"    OB History    Grav Para Term Preterm Abortions TAB SAB Ect Mult Living                  Review of Systems  Constitutional: Negative for fever.  HENT: Positive for facial swelling and dental problem. Negative for sore throat, trouble swallowing, neck pain and voice change.   Respiratory: Negative for shortness of breath.   Cardiovascular: Negative for chest pain.  Skin: Negative for rash.  Neurological: Negative for headaches.    Allergies  Review of patient's allergies indicates no known allergies.  Home Medications   Current Outpatient Rx  Name Route Sig Dispense Refill  . ALLOPURINOL 300 MG PO TABS Oral Take 300 mg by mouth daily as needed. Gout    . ASPIRIN 325 MG PO TABS Oral Take 325 mg by mouth daily.    Marland Kitchen VITAMIN D 1000 UNITS PO TABS Oral Take 2,000 Units by mouth daily.    . COLCHICINE 0.6 MG PO TABS Oral Take 0.6 mg by mouth daily as needed. gout    . EZETIMIBE 10 MG PO TABS Oral Take 10 mg by mouth daily.    . FUROSEMIDE 80 MG PO TABS Oral Take 40-80 mg by mouth daily. Dose based on amount of swelling    . GARLIC 123XX123 MG PO  CAPS Oral Take 1,000 mg by mouth daily.    . ISOSORBIDE MONONITRATE ER 30 MG PO TB24 Oral Take 1 tablet (30 mg total) by mouth daily. 30 tablet 11  . LORAZEPAM 0.5 MG PO TABS Oral Take 0.5 mg by mouth at bedtime as needed. Anxiety    . METFORMIN HCL ER 750 MG PO TB24 Oral Take 750 mg by mouth daily with breakfast.    . MULTI-VITAMIN/MINERALS PO TABS Oral Take 1 tablet by mouth daily.    . NEBIVOLOL HCL 5 MG PO TABS Oral Take 5 mg by mouth daily.    Marland Kitchen NITROGLYCERIN 0.4 MG SL SUBL Sublingual Place 0.4 mg under the tongue every 5 (five) minutes as needed.    Marland Kitchen PANTOPRAZOLE SODIUM 40 MG PO TBEC Oral Take 1 tablet (40 mg total) by mouth daily at 12 noon. 30 tablet 11  . POTASSIUM CHLORIDE CRYS ER 20 MEQ PO TBCR Oral Take 20 mEq by mouth daily.    Marland Kitchen ROSUVASTATIN CALCIUM 10 MG PO TABS Oral Take 10 mg by mouth daily.     Marland Kitchen VALSARTAN-HYDROCHLOROTHIAZIDE 320-25 MG PO TABS Oral Take 1 tablet by mouth daily.      There were no vitals taken for this visit.  Physical Exam  Nursing note and vitals reviewed. Constitutional: She appears well-developed and well-nourished. No distress.  HENT:  Head: Atraumatic.  Right Ear: External ear normal.  Left Ear: External ear normal.  Nose: Nose normal.  Mouth/Throat: Oropharynx is clear and moist. No oropharyngeal exudate.         No evidence of deep tissue infection on exam.    Eyes: Conjunctivae normal are normal.  Neck: Normal range of motion. Neck supple.  Lymphadenopathy:    She has no cervical adenopathy.  Neurological: She is alert.  Skin: Skin is warm. No rash noted.  Psychiatric: She has a normal mood and affect.    ED Course  Procedures (including critical care time)  Labs Reviewed - No data to display No results found.   No diagnosis found.  1. Dental pain  MDM  R upper premolar dental pain, recurrent and worsen.  Doubt deep tissue infection.  Doubt cardiac etiology.  Since pt has hx of CAD, i recommend avoid taking NSAIDs on a regular basis.  Will prescribe pain med, abx, and referral to dentist. Pt otherwise well appearing.    BP 144/69  Pulse 78  Temp 98.7 F (37.1 C) (Oral)  Resp 16  SpO2 100%  I have reviewed nursing notes and vital signs. I reviewed available ER/hospitalization records thought the EMR      Domenic Moras, [place] 03/07/12 [id number]

## 2012-03-07 NOTE — ED Provider Notes (Signed)
Medical screening examination/treatment/procedure(s) were performed by non-physician practitioner and as supervising physician I was immediately available for consultation/collaboration.  Orlie Dakin, MD 03/07/12 2204

## 2012-06-22 ENCOUNTER — Emergency Department (HOSPITAL_COMMUNITY)
Admission: EM | Admit: 2012-06-22 | Discharge: 2012-06-22 | Disposition: A | Discharge status: Home / Self Care | Payer: 59 | Attending: Emergency Medicine | Admitting: Emergency Medicine

## 2012-06-22 ENCOUNTER — Encounter (HOSPITAL_COMMUNITY): Payer: Self-pay | Admitting: *Deleted

## 2012-06-22 DIAGNOSIS — Z853 Personal history of malignant neoplasm of breast: Secondary | ICD-10-CM | POA: Insufficient documentation

## 2012-06-22 DIAGNOSIS — Z9861 Coronary angioplasty status: Secondary | ICD-10-CM | POA: Insufficient documentation

## 2012-06-22 DIAGNOSIS — Z79899 Other long term (current) drug therapy: Secondary | ICD-10-CM | POA: Insufficient documentation

## 2012-06-22 DIAGNOSIS — Z7901 Long term (current) use of anticoagulants: Secondary | ICD-10-CM | POA: Insufficient documentation

## 2012-06-22 DIAGNOSIS — J069 Acute upper respiratory infection, unspecified: Secondary | ICD-10-CM | POA: Insufficient documentation

## 2012-06-22 DIAGNOSIS — J029 Acute pharyngitis, unspecified: Secondary | ICD-10-CM | POA: Insufficient documentation

## 2012-06-22 DIAGNOSIS — E785 Hyperlipidemia, unspecified: Secondary | ICD-10-CM | POA: Insufficient documentation

## 2012-06-22 DIAGNOSIS — J40 Bronchitis, not specified as acute or chronic: Secondary | ICD-10-CM

## 2012-06-22 DIAGNOSIS — J209 Acute bronchitis, unspecified: Secondary | ICD-10-CM | POA: Insufficient documentation

## 2012-06-22 DIAGNOSIS — D259 Leiomyoma of uterus, unspecified: Secondary | ICD-10-CM | POA: Insufficient documentation

## 2012-06-22 DIAGNOSIS — I10 Essential (primary) hypertension: Secondary | ICD-10-CM | POA: Insufficient documentation

## 2012-06-22 DIAGNOSIS — Z8679 Personal history of other diseases of the circulatory system: Secondary | ICD-10-CM | POA: Insufficient documentation

## 2012-06-22 DIAGNOSIS — Z8669 Personal history of other diseases of the nervous system and sense organs: Secondary | ICD-10-CM | POA: Insufficient documentation

## 2012-06-22 DIAGNOSIS — R599 Enlarged lymph nodes, unspecified: Secondary | ICD-10-CM | POA: Insufficient documentation

## 2012-06-22 DIAGNOSIS — Z87891 Personal history of nicotine dependence: Secondary | ICD-10-CM | POA: Insufficient documentation

## 2012-06-22 DIAGNOSIS — Z8709 Personal history of other diseases of the respiratory system: Secondary | ICD-10-CM | POA: Insufficient documentation

## 2012-06-22 DIAGNOSIS — M109 Gout, unspecified: Secondary | ICD-10-CM | POA: Insufficient documentation

## 2012-06-22 DIAGNOSIS — I2510 Atherosclerotic heart disease of native coronary artery without angina pectoris: Secondary | ICD-10-CM | POA: Insufficient documentation

## 2012-06-22 DIAGNOSIS — J3489 Other specified disorders of nose and nasal sinuses: Secondary | ICD-10-CM | POA: Insufficient documentation

## 2012-06-22 DIAGNOSIS — E119 Type 2 diabetes mellitus without complications: Secondary | ICD-10-CM | POA: Insufficient documentation

## 2012-06-22 MED ORDER — AZITHROMYCIN 250 MG PO TABS: 250.0000 mg | ORAL_TABLET | Freq: Every day | ORAL | Status: DC

## 2012-06-22 MED ORDER — BENZONATATE 100 MG PO CAPS: 100.0000 mg | ORAL_CAPSULE | Freq: Three times a day (TID) | ORAL | Status: DC

## 2012-06-22 MED ORDER — BENZONATATE 100 MG PO CAPS
200.0000 mg | ORAL_CAPSULE | Freq: Once | ORAL | Status: AC
  Administered 2012-06-22: 200 mg via ORAL
  Filled 2012-06-22: qty 2

## 2012-06-22 NOTE — ED Provider Notes (Signed)
History     CSN: [id number]  Arrival date & time 06/22/12  1136   First MD Initiated Contact with Patient 06/22/12 1214      Chief Complaint  Patient presents with  . Sore Throat  . Cough    (Consider location/radiation/quality/duration/timing/severity/associated sxs/prior treatment) HPI Comments: 62 year old female presents emergency Department cleaning of sore throat and nonproductive cough beginning 24 hours ago. Throat pain worse with swallowing, however she is able to eat. Denies fever or chills. She's tried taking over-the-counter medication without any relief. States this feels similar to when she comes down with bronchitis almost every year. She tried calling her primary care office today, however she is not able to be seen to come to the emergency room. Denies sick contacts.  Patient is a 62 y.o. female presenting with pharyngitis and cough. The history is provided by the patient and the spouse.  Sore Throat Associated symptoms include congestion, coughing and a sore throat. Pertinent negatives include no arthralgias, chest pain, chills, fever, myalgias, nausea, neck pain or vomiting.  Cough Associated symptoms include sore throat. Pertinent negatives include no chest pain, no chills, no myalgias, no shortness of breath and no wheezing.    Past Medical History  Diagnosis Date  . Coronary artery disease   . Stented coronary artery   . Hypertension   . Diabetes mellitus   . Hyperlipidemia   . Morbid obesity   . Breast cancer 2008    S/P mastectomy  . Atrial fibrillation   . Angina   . Asthma   . 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 Sleep apnea   . Gout   . Diabetes mellitus, uncontrolled 07/14/2011  . CAD (coronary artery disease), nonobstructive CAD by cath 07/13/11 07/19/2011    Past Surgical History  Procedure Date  . Coronary angioplasty with stent placement 2005    "1"  . Cardiac catheterization 07/13/11  . Mastectomy 2008    left    Family  History  Problem Relation Age of Onset  . Coronary artery disease Mother   . Hypertension Mother   . Atrial fibrillation Son     History  Substance Use Topics  . Smoking status: Former Smoker -- 0.5 packs/day for 6 years    Types: Cigarettes    Quit date: 05/18/1992  . Smokeless tobacco: Never Used  . Alcohol Use: No     Comment: 07/13/11 "have drank occasionally; not now"    OB History    Grav Para Term Preterm Abortions TAB SAB Ect Mult Living                  Review of Systems  Constitutional: Negative for fever and chills.  HENT: Positive for congestion and sore throat. Negative for trouble swallowing, neck pain and neck stiffness.   Respiratory: Positive for cough. Negative for shortness of breath and wheezing.   Cardiovascular: Negative for chest pain.  Gastrointestinal: Negative for nausea and vomiting.  Musculoskeletal: Negative for myalgias and arthralgias.  All other systems reviewed and are negative.    Allergies  Review of patient's allergies indicates no known allergies.  Home Medications   Current Outpatient Rx  Name  Route  Sig  Dispense  Refill  . ALLOPURINOL 300 MG PO TABS   Oral   Take 300 mg by mouth daily as needed. Gout         . VITAMIN D 1000 UNITS PO TABS   Oral   Take 2,000 Units by  mouth daily.         . COLCHICINE 0.6 MG PO TABS   Oral   Take 0.6 mg by mouth daily as needed. gout         . DABIGATRAN ETEXILATE MESYLATE 150 MG PO CAPS   Oral   Take 150 mg by mouth every 12 (twelve) hours.         Marland Kitchen EZETIMIBE 10 MG PO TABS   Oral   Take 10 mg by mouth daily.         . FUROSEMIDE 80 MG PO TABS   Oral   Take 40-80 mg by mouth daily. Dose based on amount of swelling         . ISOSORBIDE MONONITRATE ER 30 MG PO TB24   Oral   Take 1 tablet (30 mg total) by mouth daily.   30 tablet   11   . LORAZEPAM 0.5 MG PO TABS   Oral   Take 0.5 mg by mouth at bedtime as needed. Anxiety         . METFORMIN HCL ER (MOD) 1000  MG PO TB24   Oral   Take 1,000 mg by mouth daily with breakfast.         . MULTI-VITAMIN/MINERALS PO TABS   Oral   Take 1 tablet by mouth daily.         . NEBIVOLOL HCL 5 MG PO TABS   Oral   Take 5 mg by mouth daily.         Marland Kitchen PANTOPRAZOLE SODIUM 40 MG PO TBEC   Oral   Take 1 tablet (40 mg total) by mouth daily at 12 noon.   30 tablet   11   . POTASSIUM CHLORIDE CRYS ER 20 MEQ PO TBCR   Oral   Take 20 mEq by mouth daily.         Marland Kitchen ROSUVASTATIN CALCIUM 10 MG PO TABS   Oral   Take 10 mg by mouth daily.         Marland Kitchen VALSARTAN-HYDROCHLOROTHIAZIDE 320-25 MG PO TABS   Oral   Take 1 tablet by mouth daily.         . AZITHROMYCIN 250 MG PO TABS   Oral   Take 1 tablet (250 mg total) by mouth daily.   6 tablet   0   . BENZONATATE 100 MG PO CAPS   Oral   Take 1 capsule (100 mg total) by mouth every 8 (eight) hours.   21 capsule   0   . NITROGLYCERIN 0.4 MG SL SUBL   Sublingual   Place 0.4 mg under the tongue every 5 (five) minutes as needed.           BP 138/82  Pulse 83  Temp 98.6 F (37 C) (Oral)  Resp 20  SpO2 98%  Physical Exam  Nursing note and vitals reviewed. Constitutional: She is oriented to person, place, and time. She appears well-developed and well-nourished. No distress.  HENT:  Head: Normocephalic and atraumatic.  Nose: Mucosal edema present.  Mouth/Throat: Uvula is midline and mucous membranes are normal. Posterior oropharyngeal edema and posterior oropharyngeal erythema present. No oropharyngeal exudate or tonsillar abscesses.  Eyes: Conjunctivae normal and EOM are normal. Pupils are equal, round, and reactive to light. No scleral icterus.  Neck: Normal range of motion. Neck supple.  Cardiovascular: Normal rate, regular rhythm and normal heart sounds.   Pulmonary/Chest: Effort normal and breath sounds normal. No respiratory distress. She has  no decreased breath sounds. She has no wheezes. She has no rhonchi. She has no rales.        Harsh cough present.  Abdominal: Soft. Bowel sounds are normal. There is no tenderness.  Musculoskeletal: Normal range of motion. She exhibits no edema.  Lymphadenopathy:    She has cervical adenopathy.  Neurological: She is alert and oriented to person, place, and time.  Skin: Skin is warm and dry.  Psychiatric: She has a normal mood and affect. Her behavior is normal.    ED Course  Procedures (including critical care time)  Labs Reviewed - No data to display No results found.   1. URI (upper respiratory infection)   2. Bronchitis       MDM  62 y/o female with URI. She has episodes like this almost yearly and always comes down with bronchitis. Harsh cough present. Lungs clear. She is congested. Vitals stable. Rx tessalon for cough. Rx azithromycin- I advised patient to not have this filled unless her symptoms do not improve within the next 48 hours. Return precautions discussed. Patient states understanding of plan and is agreeable.         Illene Labrador, PA-C 06/22/12 1253

## 2012-06-22 NOTE — ED Notes (Signed)
Pt reports sore throat and no-productive cough x 2 days.  Denies known fever.  Pt reports taking OTC meds without relief.

## 2012-06-22 NOTE — ED Provider Notes (Signed)
  Medical screening examination/treatment/procedure(s) were performed by non-physician practitioner and as supervising physician I was immediately available for consultation/collaboration.    Carmin Muskrat, MD 06/22/12 1501

## 2012-11-08 ENCOUNTER — Telehealth: Payer: Self-pay | Admitting: Cardiology

## 2012-11-08 MED ORDER — EZETIMIBE 10 MG PO TABS: 10.0000 mg | ORAL_TABLET | Freq: Every day | ORAL | Status: DC

## 2012-11-08 MED ORDER — DABIGATRAN ETEXILATE MESYLATE 150 MG PO CAPS: 150.0000 mg | ORAL_CAPSULE | Freq: Two times a day (BID) | ORAL | Status: DC

## 2012-11-08 NOTE — Telephone Encounter (Signed)
Need samples of Prodaxa 150mg ,Zeita 10mg  and Bystolic 5mg  please! Please call and let her know if you have them!

## 2012-11-08 NOTE — Telephone Encounter (Signed)
Returned call and pt informed samples left at front desk.  Pt verbalized understanding and agreed w/ plan.    

## 2012-12-14 ENCOUNTER — Telehealth: Payer: Self-pay | Admitting: Cardiology

## 2012-12-14 MED ORDER — NEBIVOLOL HCL 5 MG PO TABS: 5.0000 mg | ORAL_TABLET | Freq: Every day | ORAL | Status: DC

## 2012-12-14 MED ORDER — EZETIMIBE 10 MG PO TABS: 10.0000 mg | ORAL_TABLET | Freq: Every day | ORAL | Status: DC

## 2012-12-14 MED ORDER — DABIGATRAN ETEXILATE MESYLATE 150 MG PO CAPS: 150.0000 mg | ORAL_CAPSULE | Freq: Two times a day (BID) | ORAL | Status: DC

## 2012-12-14 NOTE — Telephone Encounter (Signed)
Would like to get some samples of Pradaxa 65mg  , Bystolic 5 or 10 mg  And Zetia 10mg  today .Marland Kitchen Please call   Thanks

## 2012-12-14 NOTE — Telephone Encounter (Signed)
Samples left at front desk for pt pick up.    Returned call and pt informed samples left at front desk.  Pt verbalized understanding and agreed w/ plan.   Lot: [id number]  Exp: 10/2013 (Pradaxa) Lot: [id number] Exp: [id number] (Bystolic) Lot: [id number] Exp: 10/2014 (Zetia)

## 2012-12-15 ENCOUNTER — Encounter: Payer: Self-pay | Admitting: *Deleted

## 2012-12-19 ENCOUNTER — Encounter: Payer: Self-pay | Admitting: Cardiology

## 2012-12-19 ENCOUNTER — Ambulatory Visit (INDEPENDENT_AMBULATORY_CARE_PROVIDER_SITE_OTHER): Payer: 59 | Admitting: Cardiology

## 2012-12-19 VITALS — BP 124/86 | HR 69 | Ht 63.0 in | Wt 233.7 lb

## 2012-12-19 DIAGNOSIS — R252 Cramp and spasm: Secondary | ICD-10-CM

## 2012-12-19 DIAGNOSIS — G4730 Sleep apnea, unspecified: Secondary | ICD-10-CM

## 2012-12-19 DIAGNOSIS — R609 Edema, unspecified: Secondary | ICD-10-CM

## 2012-12-19 DIAGNOSIS — I2510 Atherosclerotic heart disease of native coronary artery without angina pectoris: Secondary | ICD-10-CM

## 2012-12-19 DIAGNOSIS — Z79899 Other long term (current) drug therapy: Secondary | ICD-10-CM

## 2012-12-19 DIAGNOSIS — I10 Essential (primary) hypertension: Secondary | ICD-10-CM

## 2012-12-19 DIAGNOSIS — R0609 Other forms of dyspnea: Secondary | ICD-10-CM

## 2012-12-19 DIAGNOSIS — I4821 Permanent atrial fibrillation: Secondary | ICD-10-CM

## 2012-12-19 DIAGNOSIS — E785 Hyperlipidemia, unspecified: Secondary | ICD-10-CM

## 2012-12-19 DIAGNOSIS — R600 Localized edema: Secondary | ICD-10-CM

## 2012-12-19 DIAGNOSIS — Z9861 Coronary angioplasty status: Secondary | ICD-10-CM

## 2012-12-19 DIAGNOSIS — I4891 Unspecified atrial fibrillation: Secondary | ICD-10-CM

## 2012-12-19 MED ORDER — PANTOPRAZOLE SODIUM 40 MG PO TBEC: 40.0000 mg | DELAYED_RELEASE_TABLET | Freq: Every day | ORAL | Status: DC

## 2012-12-19 MED ORDER — FUROSEMIDE 80 MG PO TABS: 40.0000 mg | ORAL_TABLET | Freq: Two times a day (BID) | ORAL | Status: DC

## 2012-12-19 MED ORDER — ISOSORBIDE MONONITRATE ER 30 MG PO TB24: 30.0000 mg | ORAL_TABLET | Freq: Every day | ORAL | Status: DC

## 2012-12-19 MED ORDER — NITROGLYCERIN 0.4 MG SL SUBL: 0.4000 mg | SUBLINGUAL_TABLET | SUBLINGUAL | Status: DC | PRN

## 2012-12-19 NOTE — Patient Instructions (Addendum)
TAKE AN EXTRA DOSE FUROSEMIDE TODAY,TOMORROW,WEDNESDAY  Friday have labs drawn CMP,Magnesium   Your physician wants you to follow-up in 3 month Dr Ellyn Hack.  You will receive a reminder letter in the mail two months in advance. If you don't receive a letter, please call our office to schedule the follow-up appointment.

## 2012-12-20 ENCOUNTER — Encounter: Payer: Self-pay | Admitting: Cardiology

## 2012-12-20 DIAGNOSIS — I4821 Permanent atrial fibrillation: Secondary | ICD-10-CM | POA: Insufficient documentation

## 2012-12-20 DIAGNOSIS — R600 Localized edema: Secondary | ICD-10-CM | POA: Insufficient documentation

## 2012-12-20 DIAGNOSIS — Z9861 Coronary angioplasty status: Secondary | ICD-10-CM | POA: Insufficient documentation

## 2012-12-20 NOTE — Assessment & Plan Note (Signed)
She seems to be doing well with the CPAP.

## 2012-12-20 NOTE — Assessment & Plan Note (Signed)
No real symptoms of angina. She continues to be on beta blocker, ARB (with HCTZ), along with Zetia and Crestor. Not on aspirin due to being on Pradaxa -- she should probably be on aspirin for CAD unless there is a significant risk of bleeding because she did have moderate coronary disease by last cath. We'll discuss this with her when she comes back.

## 2012-12-20 NOTE — Assessment & Plan Note (Signed)
I was so proud of her during her last visit for the 14 pound weight loss, however she's gained half of that back after vacation. She needs to get back into her dietary modification and get back into exercise. We spent 3-4 minutes talking about different options and plans.

## 2012-12-20 NOTE — Assessment & Plan Note (Signed)
Blood pressure is well-controlled today on current medications.

## 2012-12-20 NOTE — Progress Notes (Signed)
Patient ID: Stephanie Franco, female   DOB: 01/31/1951, 62 y.o.   MRN: [id number]  PCP: Salena Saner., MD  Clinic Note: Chief Complaint  Patient presents with  . Follow-up    6 months rov   HPI: Stephanie Franco is a 62 y.o. female with a PMH below who presents today for routine followup. She is a history of persistent/almost chronic atrial fibrillation. She was evaluated for coronary disease and diverticulum 13 was found to have moderate disease, but does have a history of stent to the circumflex in 2006 but has a 40% ISR. Echocardiography demonstrated severely dilated left and right atria with moderate MR and moderately elevated PA pressures that would make her difficult to get out of atrial fibrillation. I last saw her back in January of this year, she lost about 14 pounds, and was doing quite well. She now returns, shortly after a vacation during which she put back on some of her weight that she lost, because of less exercise and eating a lot more on her vacation.  Interval History: Fine, but does note a little bit of worsening shortness of breath with exertion this did not have before. She denies any chest tightness or pressure associated with any exertion however. She doesn't really note having atrial fibrillation or any sensation of atrial fibrillation, but does note the PVCs that she has quite frequently. When they happen frequently she is somewhat bothered by them but otherwise doesn't note any bad symptoms. In addition to her shortness of breath she has noted little but worsening edema since her trip. She does deny PND and orthopnea, but mostly uses CPAP at night. He denies any lightheadedness, dizziness, wooziness, CT or near-syncope. No TIA or RCA symptoms. No claudication symptoms, no melena, hematochezia or hematuria.  The remainder of cardiac review of systems is as follows: Cardiovascular ROS: negative   Past Medical History  Diagnosis Date  . CAD S/P percutaneous  coronary angioplasty 2006;     PCI of circumflex  . Stented coronary artery     40% ISR in Circumflex stent  . Hypertension   . Hyperlipidemia   . Morbid obesity     BMI 41  . Breast cancer 2008    S/P mastectomy  . Atrial fibrillation, permanent   . Asthma   . 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 OSA on CPAP     On CPAP  . Gout   . Diabetes mellitus, uncontrolled 07/14/2011  . CAD (coronary artery disease), nonobstructive CAD by cath 07/13/11 07/19/2011    50-60% short lesion in RCA, 40% ISR Circumflex stent.  . History of echocardiogram 07/22/2011    LV borderline dilated; borderline concentric LVH; EF 99991111; RV sysolic function mildly reduced; LA severely dilated; RA mod-severely dilated; mild-mod MR; mild TR; mod pulm HTN;   . Edema of both legs     Usually mild, chronic. Controlled with when necessary furosemide and diet    Prior Cardiac Evaluation and Past Surgical History: Past Surgical History  Procedure Laterality Date  . Coronary angioplasty with stent placement  2006    stent to circumflex  . Cardiac catheterization  07/13/11    normal EF; 62mm 50-60% lesion in RCA; patent stent in circumflex form 2006 with ~40% in-stent re-stenosis  . Mastectomy  2008    left   No Known Allergies  Current Outpatient Prescriptions  Medication Sig Dispense Refill  . allopurinol (ZYLOPRIM) 300 MG tablet Take 300 mg by  mouth daily as needed. Gout      . cholecalciferol (VITAMIN D) 1000 UNITS tablet Take 2,000 Units by mouth daily.      . colchicine 0.6 MG tablet Take 0.6 mg by mouth daily as needed. gout      . dabigatran (PRADAXA) 150 MG CAPS Take 1 capsule (150 mg total) by mouth every 12 (twelve) hours.  60 capsule  0  . ezetimibe (ZETIA) 10 MG tablet Take 1 tablet (10 mg total) by mouth daily.  28 tablet  0  . ferrous sulfate 325 (65 FE) MG EC tablet Take 325 mg by mouth daily with breakfast.      . fluticasone (FLONASE) 50 MCG/ACT nasal spray Place 2 sprays into the nose  as needed for rhinitis.      . furosemide (LASIX) 80 MG tablet Take 0.5-1 tablets (40-80 mg total) by mouth 2 (two) times daily. Dose based on amount of swelling  60 tablet  11  . isosorbide mononitrate (IMDUR) 30 MG 24 hr tablet Take 1 tablet (30 mg total) by mouth daily.  30 tablet  11  . LORazepam (ATIVAN) 0.5 MG tablet Take 0.5 mg by mouth at bedtime as needed. Anxiety      . metFORMIN (GLUMETZA) 1000 MG (MOD) 24 hr tablet Take 1,000 mg by mouth 2 (two) times daily with a meal.       . Multiple Vitamins-Minerals (MULTIVITAMIN WITH MINERALS) tablet Take 1 tablet by mouth daily.      . nebivolol (BYSTOLIC) 5 MG tablet Take 1 tablet (5 mg total) by mouth daily.  28 tablet  0  . nitroGLYCERIN (NITROSTAT) 0.4 MG SL tablet Place 1 tablet (0.4 mg total) under the tongue every 5 (five) minutes as needed.  25 tablet  6  . NON FORMULARY at bedtime. CPAP      . pantoprazole (PROTONIX) 40 MG tablet Take 1 tablet (40 mg total) by mouth daily.  30 tablet  11  . potassium chloride SA (K-DUR,KLOR-CON) 20 MEQ tablet Take 20 mEq by mouth 2 (two) times daily.       . rosuvastatin (CRESTOR) 10 MG tablet Take 10 mg by mouth daily.      . valsartan-hydrochlorothiazide (DIOVAN-HCT) 320-25 MG per tablet Take 1 tablet by mouth daily.       No current facility-administered medications for this visit.    History   Social History Narrative   She is a married mother of 58, grandmother 2. Usually accompanied by her husband. She does not work. She had been working on her exercise, but is no longer as active. She did do considerable walking on her vacation. Does not drink regularly (used to on occasion but not now) and does not smoke, but was a former one half pack/day  Smoker for about 6 years and quit in 1994    ROS: A comprehensive Review of Systems - Negative except pertinent positives noted above She does note bilateral hand and feet cramping that is usually worse at night. She does have some mild arthritis pains in  her back, hips and knees that limit her desire to do exercise. She has been more active during the warmer months.  PHYSICAL EXAM BP 124/86  Pulse 69  Ht 5\' 3"  (1.6 m)  Wt 233 lb 11.2 oz (106.006 kg)  BMI 41.41 kg/m2 General appearance: alert, cooperative, appears stated age, no distress and morbidly obese Neck: no adenopathy, no carotid bruit and no JVD Lungs: clear to auscultation bilaterally, normal percussion  bilaterally and Nonlabored, good air movement Heart: irregularly irregular rhythm, S1, S2 normal, no S3 or S4, systolic murmur: holosystolic 1/6, blowing and Faint at apex, No radiation, no click and no rub Abdomen: soft, non-tender; bowel sounds normal; no masses,  no organomegaly and Morbidly obese Extremities: edema 1+ bilateral lower, no edema, redness or tenderness in the calves or thighs and no ulcers, gangrene or trophic changes Pulses: 2+ and symmetric Neurologic: Alert and oriented X 3, normal strength and tone. Normal symmetric reflexes. Normal coordination and gait  [id number] today: Yes Rate: 69 , Rhythm: Atrial fibrillation, LVH with repolarization amount he and QRS widening. Left axis deviation, possible inferior MI, age undetermined    Recent Labs: None  ASSESSMENT / PLAN: DOE (dyspnea on exertion) Her shortness of breath on exertion is a little bit more than she usually would have. Is not surprising that this comes after a vacation where she's gained some weight, and she notes that her swelling is more pronounced.  my suspicion, is a this is probably related to increased volume overload with a mild diastolic heart failure complement. It is certainly multifactorial given her obesity, moderate pulmonary hypertension and mitral regurgitation. Affect is not associated with any chest tightness or pressure patient is concerned about being anginal in nature.  Plan: Increase Lasix dose to 80 mg in the morning and 40 mg in the afternoon for the next 3 days, then reduced  and 80 mg daily; in light of her cramps, will check CMP plus magnesium later this week. All see her back a little sooner than I usually would about 3 months.  She will let us know if any symptoms are getting worse.  CAD S/P percutaneous coronary angioplasty No real symptoms of angina. She continues to be on beta blocker, ARB (with HCTZ), along with Zetia and Crestor. Not on aspirin due to being on Pradaxa -- she should probably be on aspirin for CAD unless there is a significant risk of bleeding because she did have moderate coronary disease by last cath. We'll discuss this with her when she comes back.  Morbid obesity I was so proud of her during her last visit for the 14 pound weight loss, however she's gained half of that back after vacation. She needs to get back into her dietary modification and get back into exercise. We spent 3-4 minutes talking about different options and plans.  Sleep apnea, on C-Pap She seems to be doing well with the CPAP.  Edema of both legs Edema is a lower. This should get better with the Lasix.  Hypertension Blood pressure is well-controlled today on current medications.  Hyperlipidemia I believe her lipid levels are being followed by her primary physician -- continue Crestor and Zetia   Orders Placed This Encounter  Procedures  . Comprehensive metabolic panel  . Magnesium  . EKG 12-Lead   Followup: 3 month  Sunshyne Horvath W. Ellyn Hack, M.D., M.S. [facility] [address] [place], [place]  [address]  [phone] Pager # [phone]

## 2012-12-20 NOTE — Assessment & Plan Note (Signed)
Edema is a lower. This should get better with the Lasix.

## 2012-12-20 NOTE — Assessment & Plan Note (Signed)
I believe her lipid levels are being followed by her primary physician -- continue Crestor and Zetia

## 2012-12-20 NOTE — Assessment & Plan Note (Addendum)
Her shortness of breath on exertion is a little bit more than she usually would have. Is not surprising that this comes after a vacation where she's gained some weight, and she notes that her swelling is more pronounced.  my suspicion, is a this is probably related to increased volume overload with a mild diastolic heart failure complement. It is certainly multifactorial given her obesity, moderate pulmonary hypertension and mitral regurgitation. Affect is not associated with any chest tightness or pressure patient is concerned about being anginal in nature.  Plan: Increase Lasix dose to 80 mg in the morning and 40 mg in the afternoon for the next 3 days, then reduced and 80 mg daily; in light of her cramps, will check CMP plus magnesium later this week. All see her back a little sooner than I usually would about 3 months.  She will let us know if any symptoms are getting worse.

## 2012-12-23 LAB — COMPREHENSIVE METABOLIC PANEL
BUN: 23 mg/dL (ref 6–23)
CO2: 34 mEq/L — ABNORMAL HIGH (ref 19–32)
Calcium: 9.5 mg/dL (ref 8.4–10.5)
Chloride: 101 mEq/L (ref 96–112)
Creat: 1.19 mg/dL — ABNORMAL HIGH (ref 0.50–1.10)

## 2012-12-23 LAB — MAGNESIUM: Magnesium: 1.6 mg/dL (ref 1.5–2.5)

## 2013-01-18 ENCOUNTER — Telehealth: Payer: Self-pay | Admitting: Cardiology

## 2013-01-18 MED ORDER — NEBIVOLOL HCL 5 MG PO TABS: 5.0000 mg | ORAL_TABLET | Freq: Every day | ORAL | Status: DC

## 2013-01-18 MED ORDER — DABIGATRAN ETEXILATE MESYLATE 150 MG PO CAPS: 150.0000 mg | ORAL_CAPSULE | Freq: Two times a day (BID) | ORAL | Status: DC

## 2013-01-18 MED ORDER — EZETIMIBE 10 MG PO TABS: 10.0000 mg | ORAL_TABLET | Freq: Every day | ORAL | Status: DC

## 2013-01-18 NOTE — Telephone Encounter (Signed)
Need some samples of Pradaxa [id number] mg,Bystolic 5 mg and Zetia 10 mg please.

## 2013-01-18 NOTE — Telephone Encounter (Signed)
Returned call and left message samples left at front desk.  Call before 4pm if questions.

## 2013-02-08 ENCOUNTER — Telehealth: Payer: Self-pay | Admitting: Cardiology

## 2013-02-08 NOTE — Telephone Encounter (Signed)
Returned call.  Pt informed she will have to call back next week for samples as last samples given 3 weeks ago.  Pt verbalized understanding and stated she found the last ones she was given after she called. Pt will call back next week.

## 2013-02-08 NOTE — Telephone Encounter (Signed)
Would like some samples of Pradaxa 150 mg,Zetia 10 mg and Bystolic 5 mg please.

## 2013-03-08 ENCOUNTER — Telehealth: Payer: Self-pay | Admitting: Cardiology

## 2013-03-08 MED ORDER — EZETIMIBE 10 MG PO TABS: 10.0000 mg | ORAL_TABLET | Freq: Every day | ORAL | Status: DC

## 2013-03-08 MED ORDER — NEBIVOLOL HCL 5 MG PO TABS: 5.0000 mg | ORAL_TABLET | Freq: Every day | ORAL | Status: DC

## 2013-03-08 MED ORDER — DABIGATRAN ETEXILATE MESYLATE 150 MG PO CAPS: 150.0000 mg | ORAL_CAPSULE | Freq: Two times a day (BID) | ORAL | Status: DC

## 2013-03-08 NOTE — Telephone Encounter (Signed)
Would like some samples of Pradaxa 150 mg,Zetia 10 mg and Bystolic 5mg  please.

## 2013-03-08 NOTE — Telephone Encounter (Signed)
Returned call and Left message that samples left at front desk.  Call back today before 4pm if questions.

## 2013-04-18 ENCOUNTER — Telehealth: Payer: Self-pay | Admitting: Cardiology

## 2013-04-18 MED ORDER — DABIGATRAN ETEXILATE MESYLATE 150 MG PO CAPS: 150.0000 mg | ORAL_CAPSULE | Freq: Two times a day (BID) | ORAL | Status: DC

## 2013-04-18 NOTE — Telephone Encounter (Signed)
Returned call and pt informed samples of Pradaxa left at front desk and no samples of Zetia available.  Pt verbalized understanding and agreed w/ plan.

## 2013-04-18 NOTE — Telephone Encounter (Signed)
Would like some samples of Pradaxa 150 mg and Zetia 10 mg please.

## 2013-05-05 ENCOUNTER — Encounter (HOSPITAL_COMMUNITY): Payer: Self-pay | Admitting: Emergency Medicine

## 2013-05-05 ENCOUNTER — Emergency Department (HOSPITAL_COMMUNITY): Payer: 59

## 2013-05-05 ENCOUNTER — Emergency Department (HOSPITAL_COMMUNITY)
Admission: EM | Admit: 2013-05-05 | Discharge: 2013-05-05 | Disposition: A | Discharge status: Home / Self Care | Payer: 59 | Attending: Emergency Medicine | Admitting: Emergency Medicine

## 2013-05-05 DIAGNOSIS — J209 Acute bronchitis, unspecified: Secondary | ICD-10-CM | POA: Insufficient documentation

## 2013-05-05 DIAGNOSIS — G4733 Obstructive sleep apnea (adult) (pediatric): Secondary | ICD-10-CM | POA: Insufficient documentation

## 2013-05-05 DIAGNOSIS — I2510 Atherosclerotic heart disease of native coronary artery without angina pectoris: Secondary | ICD-10-CM | POA: Insufficient documentation

## 2013-05-05 DIAGNOSIS — Z79899 Other long term (current) drug therapy: Secondary | ICD-10-CM | POA: Insufficient documentation

## 2013-05-05 DIAGNOSIS — E785 Hyperlipidemia, unspecified: Secondary | ICD-10-CM | POA: Insufficient documentation

## 2013-05-05 DIAGNOSIS — Z853 Personal history of malignant neoplasm of breast: Secondary | ICD-10-CM | POA: Insufficient documentation

## 2013-05-05 DIAGNOSIS — Z8742 Personal history of other diseases of the female genital tract: Secondary | ICD-10-CM | POA: Insufficient documentation

## 2013-05-05 DIAGNOSIS — M109 Gout, unspecified: Secondary | ICD-10-CM | POA: Insufficient documentation

## 2013-05-05 DIAGNOSIS — E119 Type 2 diabetes mellitus without complications: Secondary | ICD-10-CM | POA: Insufficient documentation

## 2013-05-05 DIAGNOSIS — J45901 Unspecified asthma with (acute) exacerbation: Secondary | ICD-10-CM | POA: Insufficient documentation

## 2013-05-05 DIAGNOSIS — Z6841 Body Mass Index (BMI) 40.0 and over, adult: Secondary | ICD-10-CM | POA: Insufficient documentation

## 2013-05-05 DIAGNOSIS — Z792 Long term (current) use of antibiotics: Secondary | ICD-10-CM | POA: Insufficient documentation

## 2013-05-05 DIAGNOSIS — I4891 Unspecified atrial fibrillation: Secondary | ICD-10-CM | POA: Insufficient documentation

## 2013-05-05 DIAGNOSIS — Z87891 Personal history of nicotine dependence: Secondary | ICD-10-CM | POA: Insufficient documentation

## 2013-05-05 DIAGNOSIS — I10 Essential (primary) hypertension: Secondary | ICD-10-CM | POA: Insufficient documentation

## 2013-05-05 DIAGNOSIS — Z9861 Coronary angioplasty status: Secondary | ICD-10-CM | POA: Insufficient documentation

## 2013-05-05 MED ORDER — PREDNISONE 10 MG PO TABS: 60.0000 mg | ORAL_TABLET | Freq: Every day | ORAL | Status: DC

## 2013-05-05 MED ORDER — ALBUTEROL SULFATE (5 MG/ML) 0.5% IN NEBU
5.0000 mg | INHALATION_SOLUTION | Freq: Once | RESPIRATORY_TRACT | Status: AC
  Administered 2013-05-05: 5 mg via RESPIRATORY_TRACT
  Filled 2013-05-05: qty 1

## 2013-05-05 MED ORDER — ALBUTEROL SULFATE (5 MG/ML) 0.5% IN NEBU
2.5000 mg | INHALATION_SOLUTION | Freq: Once | RESPIRATORY_TRACT | Status: AC
  Administered 2013-05-05: 2.5 mg via RESPIRATORY_TRACT
  Filled 2013-05-05: qty 0.5

## 2013-05-05 MED ORDER — PREDNISONE 20 MG PO TABS
60.0000 mg | ORAL_TABLET | Freq: Once | ORAL | Status: AC
  Administered 2013-05-05: 60 mg via ORAL
  Filled 2013-05-05: qty 3

## 2013-05-05 MED ORDER — IPRATROPIUM BROMIDE 0.02 % IN SOLN
0.5000 mg | Freq: Once | RESPIRATORY_TRACT | Status: AC
  Administered 2013-05-05: 0.5 mg via RESPIRATORY_TRACT
  Filled 2013-05-05: qty 2.5

## 2013-05-05 NOTE — ED Notes (Signed)
Patient states she began having SOB and slight wheezing 3 days ago. Patient went to her PCP and was prescribed antibiotics, steroid injection, and cough syrup. Patient states wheezing and SOB increased.

## 2013-05-05 NOTE — ED Provider Notes (Signed)
CSN: [id number]     Arrival date & time 05/05/13  1412 History   First MD Initiated Contact with Patient 05/05/13 1509     Chief Complaint  Patient presents with  . Shortness of Breath  . Wheezing    HPI Patient presents with ongoing coughing congestion over the past several days.  She developed some shortness of breath and wheezing today.  She denies orthopnea.  She reports mild dyspnea with exertion.  No history of congestive heart failure.  She is a diabetic.  She denies chest pain.  She states usually when she gets upper respiratory tract infections she develops wheezing and some shortness of breath.  She has albuterol at home and try this without significant improvement.  She has never seen a pulmonologist.  She has no known history of asthma or COPD.  She's been on albuterol for several years.  She uses this only several times a year.  Symptoms are mild in severity.  Nothing thus far is improved her symptoms.  Possibly mild improvement with albuterol home but not complete relief.   Past Medical History  Diagnosis Date  . CAD S/P percutaneous coronary angioplasty 2006;     PCI of circumflex  . Stented coronary artery     40% ISR in Circumflex stent  . Hypertension   . Hyperlipidemia   . Morbid obesity     BMI 41  . Breast cancer 2008    S/P mastectomy  . Atrial fibrillation, permanent   . Asthma   . 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 OSA on CPAP     On CPAP  . Gout   . Diabetes mellitus, uncontrolled 07/14/2011  . CAD (coronary artery disease), nonobstructive CAD by cath 07/13/11 07/19/2011    50-60% short lesion in RCA, 40% ISR Circumflex stent.  . History of echocardiogram 07/22/2011    LV borderline dilated; borderline concentric LVH; EF 99991111; RV sysolic function mildly reduced; LA severely dilated; RA mod-severely dilated; mild-mod MR; mild TR; mod pulm HTN;   . Edema of both legs     Usually mild, chronic. Controlled with when necessary furosemide and diet    Past Surgical History  Procedure Laterality Date  . Coronary angioplasty with stent placement  2006    stent to circumflex  . Cardiac catheterization  07/13/11    normal EF; 28mm 50-60% lesion in RCA; patent stent in circumflex form 2006 with ~40% in-stent re-stenosis  . Mastectomy  2008    left   Family History  Problem Relation Age of Onset  . Coronary artery disease Mother   . Hypertension Mother   . Atrial fibrillation Son    History  Substance Use Topics  . Smoking status: Former Smoker -- 0.50 packs/day for 6 years    Types: Cigarettes    Quit date: 05/18/1992  . Smokeless tobacco: Never Used  . Alcohol Use: No     Comment: 07/13/11 "have drank occasionally; not now"   OB History   Grav Para Term Preterm Abortions TAB SAB Ect Mult Living                 Review of Systems  All other systems reviewed and are negative.    Allergies  Review of patient's allergies indicates no known allergies.  Home Medications   Current Outpatient Rx  Name  Route  Sig  Dispense  Refill  . albuterol (PROVENTIL HFA;VENTOLIN HFA) 108 (90 BASE) MCG/ACT inhaler   Inhalation  Inhale 2 puffs into the lungs every 6 (six) hours as needed for wheezing or shortness of breath.         . allopurinol (ZYLOPRIM) 300 MG tablet   Oral   Take 300 mg by mouth daily as needed. Gout         . azithromycin (ZITHROMAX) 250 MG tablet   Oral   Take 250 mg by mouth daily.         . cholecalciferol (VITAMIN D) 1000 UNITS tablet   Oral   Take 2,000 Units by mouth daily.         . colchicine 0.6 MG tablet   Oral   Take 0.6 mg by mouth daily as needed. gout         . dabigatran (PRADAXA) 150 MG CAPS capsule   Oral   Take 1 capsule (150 mg total) by mouth every 12 (twelve) hours.   60 capsule   0     Lot: [id number] Exp: 07/2014   . ezetimibe (ZETIA) 10 MG tablet   Oral   Take 1 tablet (10 mg total) by mouth daily.   28 tablet   0     Lot: [id number] Exp: 04/2015.   . ferrous  sulfate 325 (65 FE) MG EC tablet   Oral   Take 325 mg by mouth daily with breakfast.         . fluticasone (FLONASE) 50 MCG/ACT nasal spray   Nasal   Place 2 sprays into the nose as needed for rhinitis.         . furosemide (LASIX) 80 MG tablet   Oral   Take 0.5-1 tablets (40-80 mg total) by mouth 2 (two) times daily. Dose based on amount of swelling   60 tablet   11   . HYDROcodone-homatropine (HYCODAN) 5-1.5 MG/5ML syrup   Oral   Take 5 mLs by mouth every 6 (six) hours as needed for cough.         . isosorbide mononitrate (IMDUR) 30 MG 24 hr tablet   Oral   Take 1 tablet (30 mg total) by mouth daily.   30 tablet   11   . LORazepam (ATIVAN) 0.5 MG tablet   Oral   Take 0.5 mg by mouth at bedtime as needed. Anxiety         . metFORMIN (GLUMETZA) 1000 MG (MOD) 24 hr tablet   Oral   Take 1,000 mg by mouth 2 (two) times daily with a meal.          . Multiple Vitamins-Minerals (MULTIVITAMIN WITH MINERALS) tablet   Oral   Take 1 tablet by mouth daily.         . nebivolol (BYSTOLIC) 5 MG tablet   Oral   Take 1 tablet (5 mg total) by mouth daily.   28 tablet   0     Lot: [id number] Exp: 06/2014.   . pantoprazole (PROTONIX) 40 MG tablet   Oral   Take 1 tablet (40 mg total) by mouth daily.   30 tablet   11   . potassium chloride SA (K-DUR,KLOR-CON) 20 MEQ tablet   Oral   Take 20 mEq by mouth 2 (two) times daily.          . rosuvastatin (CRESTOR) 10 MG tablet   Oral   Take 10 mg by mouth daily.         . valsartan-hydrochlorothiazide (DIOVAN-HCT) 320-25 MG per tablet   Oral   Take 1 tablet  by mouth daily.         . nitroGLYCERIN (NITROSTAT) 0.4 MG SL tablet   Sublingual   Place 1 tablet (0.4 mg total) under the tongue every 5 (five) minutes as needed.   25 tablet   6   . predniSONE (DELTASONE) 10 MG tablet   Oral   Take 6 tablets (60 mg total) by mouth daily.   30 tablet   0    BP 131/64  Pulse 73  Temp(Src) 98.2 F (36.8 C) (Oral)   Resp 16  SpO2 96% Physical Exam  Nursing note and vitals reviewed. Constitutional: She is oriented to person, place, and time. She appears well-developed and well-nourished. No distress.  HENT:  Head: Normocephalic and atraumatic.  Eyes: EOM are normal.  Neck: Normal range of motion.  Cardiovascular: Normal rate, regular rhythm and normal heart sounds.   Pulmonary/Chest: Effort normal. No respiratory distress. She has wheezes. She has no rales.  Abdominal: Soft. She exhibits no distension. There is no tenderness.  Musculoskeletal: Normal range of motion.  Neurological: She is alert and oriented to person, place, and time.  Skin: Skin is warm and dry.  Psychiatric: She has a normal mood and affect. Judgment normal.    ED Course  Procedures (including critical care time) Labs Review Labs Reviewed - No data to display Imaging Review Dg Chest 2 View  05/05/2013   CLINICAL DATA:  Shortness of breath, cough, wheezing  EXAM: CHEST  2 VIEW  COMPARISON:  07/11/2012  FINDINGS: Lungs are essentially clear. No focal consolidation or frank interstitial edema. No pleural effusion or pneumothorax.  Cardiomegaly.  Degenerative changes of the visualized thoracolumbar spine.  IMPRESSION: No evidence of acute cardiopulmonary disease.   Electronically Signed   By: Julian Hy M.D.   On: 05/05/2013 15:42    EKG Interpretation    Date/Time:  Friday May 05 2013 14:41:15 EST Ventricular Rate:  71 PR Interval:  44 QRS Duration: 131 QT Interval:  426 QTC Calculation: 463 R Axis:   -72 Text Interpretation:  Sinus or ectopic atrial rhythm Multiple ventricular premature complexes Short PR interval Nonspecific IVCD with LAD LVH with secondary repolarization abnormality Anterior infarct, old No significant change was found Confirmed by Levy Cedano  MD, Lawayne Hartig ([id number]) on 05/05/2013 4:32:59 PM            MDM   1. Bronchospasm with bronchitis, acute    5:37 PM Patient feels much better at this  time after her breathing treatment x2.  Doubt pulmonary embolism.  This has not appeared to be a presentation of congestive heart failure.  Chest x-ray normal.  Home on steroids and albuterol.  PCP followup.  Referral to [facility].    Hoy Morn, MD 05/05/13 [phone]

## 2013-05-25 ENCOUNTER — Telehealth: Payer: Self-pay | Admitting: Cardiology

## 2013-05-25 MED ORDER — NEBIVOLOL HCL 5 MG PO TABS: 5.0000 mg | ORAL_TABLET | Freq: Every day | ORAL | Status: DC

## 2013-05-25 MED ORDER — EZETIMIBE 10 MG PO TABS: 10.0000 mg | ORAL_TABLET | Freq: Every day | ORAL | Status: DC

## 2013-05-25 MED ORDER — DABIGATRAN ETEXILATE MESYLATE 150 MG PO CAPS: 150.0000 mg | ORAL_CAPSULE | Freq: Two times a day (BID) | ORAL | Status: DC

## 2013-05-25 NOTE — Telephone Encounter (Signed)
Returned call and pt informed samples left at front desk.  Pt also informed discount copay cards will be left w/ samples this time which will help reduce out of pocket costs on meds.  Pt verbalized understanding and agreed w/ plan.

## 2013-05-25 NOTE — Telephone Encounter (Signed)
Would like some samples of Bystolic 5 mg,Pradaxa [id number] mg and Zetia 10 mg please.

## 2013-06-22 ENCOUNTER — Ambulatory Visit (INDEPENDENT_AMBULATORY_CARE_PROVIDER_SITE_OTHER): Payer: 59 | Admitting: Cardiology

## 2013-06-22 ENCOUNTER — Encounter: Payer: Self-pay | Admitting: Cardiology

## 2013-06-22 VITALS — BP 132/78 | HR 63 | Ht 64.0 in | Wt 232.3 lb

## 2013-06-22 DIAGNOSIS — R252 Cramp and spasm: Secondary | ICD-10-CM

## 2013-06-22 DIAGNOSIS — I10 Essential (primary) hypertension: Secondary | ICD-10-CM

## 2013-06-22 DIAGNOSIS — I4821 Permanent atrial fibrillation: Secondary | ICD-10-CM

## 2013-06-22 DIAGNOSIS — R0989 Other specified symptoms and signs involving the circulatory and respiratory systems: Secondary | ICD-10-CM

## 2013-06-22 DIAGNOSIS — Z79899 Other long term (current) drug therapy: Secondary | ICD-10-CM

## 2013-06-22 DIAGNOSIS — R600 Localized edema: Secondary | ICD-10-CM

## 2013-06-22 DIAGNOSIS — E785 Hyperlipidemia, unspecified: Secondary | ICD-10-CM

## 2013-06-22 DIAGNOSIS — R0609 Other forms of dyspnea: Secondary | ICD-10-CM

## 2013-06-22 DIAGNOSIS — Z9861 Coronary angioplasty status: Secondary | ICD-10-CM

## 2013-06-22 DIAGNOSIS — I4891 Unspecified atrial fibrillation: Secondary | ICD-10-CM

## 2013-06-22 DIAGNOSIS — R609 Edema, unspecified: Secondary | ICD-10-CM

## 2013-06-22 DIAGNOSIS — I2510 Atherosclerotic heart disease of native coronary artery without angina pectoris: Secondary | ICD-10-CM

## 2013-06-22 MED ORDER — NEBIVOLOL HCL 5 MG PO TABS: 5.0000 mg | ORAL_TABLET | Freq: Every day | ORAL | Status: DC

## 2013-06-22 MED ORDER — DABIGATRAN ETEXILATE MESYLATE 150 MG PO CAPS: 150.0000 mg | ORAL_CAPSULE | Freq: Two times a day (BID) | ORAL | Status: DC

## 2013-06-22 NOTE — Patient Instructions (Signed)
CALL BACK AFTER MONTH LET THE OFFICE KNOW ABOUT YOUR CRAMPS.  LABS BMP  Your physician wants you to follow-up in July 2015.  You will receive a reminder letter in the mail two months in advance. If you don't receive a letter, please call our office to schedule the follow-up appointment.

## 2013-06-23 LAB — BASIC METABOLIC PANEL
BUN: 21 mg/dL (ref 6–23)
CO2: 30 mEq/L (ref 19–32)
CREATININE: 1.1 mg/dL (ref 0.50–1.10)
Calcium: 9.8 mg/dL (ref 8.4–10.5)
Chloride: 99 mEq/L (ref 96–112)
Glucose, Bld: 91 mg/dL (ref 70–99)
Potassium: 3.4 mEq/L — ABNORMAL LOW (ref 3.5–5.3)
Sodium: 141 mEq/L (ref 135–145)

## 2013-06-24 ENCOUNTER — Encounter: Payer: Self-pay | Admitting: Cardiology

## 2013-06-24 DIAGNOSIS — R252 Cramp and spasm: Secondary | ICD-10-CM | POA: Insufficient documentation

## 2013-06-24 NOTE — Assessment & Plan Note (Signed)
Improved with increased dose of Lasix. Sliding scale Lasix. Recommended support hose.

## 2013-06-24 NOTE — Assessment & Plan Note (Signed)
Notably improved after increasing her Lasix temporarily. She now uses this technique of daily weight monitoring with sliding scale. She had to take a little more recently, this wire a check a chemistry panel to see what her potassium looks like. We may need to increase her potassium dosing.

## 2013-06-24 NOTE — Assessment & Plan Note (Signed)
On Crestor and Zetia. Monitored by PCP.

## 2013-06-24 NOTE — Progress Notes (Signed)
PCP: Salena Saner., MD  Clinic Note: Chief Complaint  Patient presents with  . 6 month visit    chest pain , sob , no edema , had avirus in jan 2015 , patient thinks she need extra k+ - fatigued    HPI: Stephanie Franco is a 63 y.o. female with a Cardiovascular Problem List below who presents today for a followup of CAD and atrial fibrillation. She has a history of PCI to the Circumflex in 2006 with a Taxus DES stent down at 40% ISR back in February 2013. She also has chronic atrial fibrillation that is relatively asymptomatic and rate controlled.  Interval History: Her major complaint today is that she has leg cramps at night. She doesn't really have much in the way of swelling as long as she takes her Lasix at 80 mg daily.  She does have occasional intermittent aches and pains in her chest that she does not think is related to her heart. It's sharp and located on both sides of the chest without radiation and not associated with any particular activity. She is really back to walking about a mile a day, and has no chest pain with that. She also occasionally notes some sudden onset dyspnea symptoms that are also not associated with any particular activity. She is recovering from a recent cold, and thinks that maybe some of her chest discomfort can be related to all the coughing she has been doing.  She does note a lot of fatigue as well as the cramping. Otherwise, he remainder of Cardiovascular ROS are as follows: no chest pain or dyspnea on exertion positive for - irregular heartbeat, rapid heart rate and  - Only notes very rare intermittent episodes lasting 10-15 minutes where her heart rate goes quite fast. negative for - edema, loss of consciousness, murmur, orthopnea, paroxysmal nocturnal dyspnea or Lightheadedness, dizziness, near syncope, TIA/amaurosis fugax, claudication. No melena, hematochezia or hematuria.:  Past Medical History  Diagnosis Date  . CAD S/P percutaneous coronary  angioplasty 2006;     PCI of circumflex with Taxus DES;; relook-cath Feb 2013: 50-60% short lesion in RCA, 40% ISR Circumflex stent.  . Stented coronary artery     40% ISR in Circumflex stent  . Hypertension   . Hyperlipidemia   . Morbid obesity     BMI 41  . Breast cancer 2008    S/P mastectomy  . Atrial fibrillation, permanent   . Asthma   . 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 OSA on CPAP     On CPAP  . Gout   . Diabetes mellitus, uncontrolled 07/14/2011  . History of echocardiogram 07/22/2011  . Edema of both legs     Usually mild, chronic. Controlled with when necessary furosemide and diet    Prior Cardiac Evaluation and Past Surgical History: Past Surgical History  Procedure Laterality Date  . Coronary angioplasty with stent placement  2006    stent to circumflex  . Cardiac catheterization  07/13/11    normal EF; 64mm 50-60% lesion in RCA; patent stent in circumflex form 2006 with ~40% in-stent re-stenosis  . Mastectomy  2008    left  . Transthoracic echocardiogram  March 2013    LV borderline dilated; borderline concentric LVH; EF 99991111; RV sysolic function mildly reduced; LA severely dilated; RA mod-severely dilated; mild-mod MR; mild TR; mod pulm HTN;     No Known Allergies  MEDICATIONS AND ALLERGIES REVIEWED IN EPIC  History  Social History Narrative   She is a married mother of 8, grandmother 2. Usually accompanied by her husband. She does not work. She had been working on her exercise, but is no longer as active. Does not drink and does not smoke   She is planning a trip for [place] in August.   ROS: A comprehensive Review of Systems - Negative except Mild fatigue, recovering from cold with aches and pains, leg cramps; otherwise negative.  PHYSICAL EXAM BP 132/78  Pulse 63  Ht 5\' 4"  (1.626 m)  Wt 232 lb 4.8 oz (105.371 kg)  BMI 39.85 kg/m2 General appearance: alert, cooperative, appears stated age, no distress and moderately obese Neck: no  adenopathy, no carotid bruit and no JVD Lungs: clear to auscultation bilaterally, normal percussion bilaterally and non-labored Heart: Irregularly irregular rhythm with normal rate; S1, S2 shenormal, no murmur, click, rub or gallop ; nondisplaced PMI Abdomen: soft, non-tender; bowel sounds normal; no masses,  no organomegaly; moderate to morbidly obese Extremities: extremities normal, atraumatic, no cyanosis, and edema 1+ bilaterally Pulses: 2+ and symmetric; Skin: normal  Neurologic: Mental status: Alert, oriented, thought content appropriate Cranial nerves: normal (II-XII grossly intact)  [id number] today: Yes Rate: 63 , Rhythm: Atrial fibrillation That nonspecific IVCD with possible LAFB; no significant change;  Recent Labs: none currently available  ASSESSMENT / PLAN: CAD S/P percutaneous coronary angioplasty No active symptoms of angina or dyspnea with exertion. I do agree that most of her symptoms she is feeling now are probably musculoskeletal related to a recent cold.  Plan: Continue current dose of beta blocker and ARB (I did mention that if she is noting some lightheadedness or dizziness that she may or may not have occasion she can hold valsartan.  Also, if she were to note a more rapid response for her A. fib, she can take double Bystolic and hold valsartan.  Atrial fibrillation, permanent Control rate control abusing Bystolic. She does occasionally have some more rapid spells come I told her this is happening, she can simply replace the overdose of the vessel or tendon Bystolic for additional rate control. She is on Pradaxa for anticoagulation, and tolerating it well with no recurrent bleeds. No nausea symptoms.  DOE (dyspnea on exertion) Notably improved after increasing her Lasix temporarily. She now uses this technique of daily weight monitoring with sliding scale. She had to take a little more recently, this wire a check a chemistry panel to see what her potassium looks  like. We may need to increase her potassium dosing.  Edema of both legs Improved with increased dose of Lasix. Sliding scale Lasix. Recommended support hose.  Hypertension Pretty well controlled on current meds.  Severe obesity (BMI >= 40) Relatively stable weight. She doesn't really well before and lost weight to put her down below the 40% BMI, right now she is right at about 40. Talked about the importance of getting back active exercising as well as working on doing her activity and diet changes. She is now doing routine exercises at the gym. I explained that she needs to do both the exercise and dietary modification to lose weight.  Hyperlipidemia On Crestor and Zetia. Monitored by PCP.  Leg cramping Really sure what is causing her cramping. She has had hand and foot cramping in the past. We'll do a trial holding her statin for about a month just to see where she stands, otherwise if not improved off of statin, would restart statin. We'll check chemistry levels today to reassess potassium  and likely increase potassium supplementation.   Orders Placed This Encounter  Procedures  . Basic metabolic panel    Order Specific Question:  Has the patient fasted?    Answer:  No  . EKG 12-Lead   Meds ordered this encounter  Medications  . nebivolol (BYSTOLIC) 5 MG tablet    Sig: Take 1 tablet (5 mg total) by mouth daily.    Dispense:  28 tablet    Refill:  0    [id number] Exp: 05/2015.  . dabigatran (PRADAXA) 150 MG CAPS capsule    Sig: Take 1 capsule (150 mg total) by mouth every 12 (twelve) hours.    Dispense:  48 capsule    Refill:  0    Lot: [id number] Exp: 07/2014    Followup: 5  Months -  Which will be prior to their trip to [place].  Tanis Hensarling W. Ellyn Hack, M.D., M.S. [facility] [address] [place], [place]  [address]  [phone] Pager # [phone]

## 2013-06-24 NOTE — Assessment & Plan Note (Signed)
Pretty well controlled on current meds.

## 2013-06-24 NOTE — Assessment & Plan Note (Signed)
Control rate control abusing Bystolic. She does occasionally have some more rapid spells come I told her this is happening, she can simply replace the overdose of the vessel or tendon Bystolic for additional rate control. She is on Pradaxa for anticoagulation, and tolerating it well with no recurrent bleeds. No nausea symptoms.

## 2013-06-24 NOTE — Assessment & Plan Note (Signed)
Really sure what is causing her cramping. She has had hand and foot cramping in the past. We'll do a trial holding her statin for about a month just to see where she stands, otherwise if not improved off of statin, would restart statin. We'll check chemistry levels today to reassess potassium and likely increase potassium supplementation.

## 2013-06-24 NOTE — Assessment & Plan Note (Signed)
No active symptoms of angina or dyspnea with exertion. I do agree that most of her symptoms she is feeling now are probably musculoskeletal related to a recent cold.  Plan: Continue current dose of beta blocker and ARB (I did mention that if she is noting some lightheadedness or dizziness that she may or may not have occasion she can hold valsartan.  Also, if she were to note a more rapid response for her A. fib, she can take double Bystolic and hold valsartan.

## 2013-06-24 NOTE — Progress Notes (Signed)
Quick Note:  So K+ is a bit low -- lets increase Kdur to BID with her being on the higher dose of Lasix. If she takes an additional dose for edema - also take a dose of K+  HARDING,DAVID W, MD  ______

## 2013-06-24 NOTE — Assessment & Plan Note (Signed)
Relatively stable weight. She doesn't really well before and lost weight to put her down below the 40% BMI, right now she is right at about 40. Talked about the importance of getting back active exercising as well as working on doing her activity and diet changes. She is now doing routine exercises at the gym. I explained that she needs to do both the exercise and dietary modification to lose weight.

## 2013-06-27 ENCOUNTER — Telehealth: Payer: Self-pay | Admitting: *Deleted

## 2013-06-27 NOTE — Telephone Encounter (Signed)
Message copied by Raiford Simmonds on Tue Jun 27, 2013  8:14 AM ------      Message from: Leonie Man      Created: Sat Jun 24, 2013  5:13 PM       So K+ is a bit low -- lets increase Kdur to BID with her being on the higher dose of Lasix.      If she takes an additional dose for edema - also take a dose of K+            HARDING,DAVID W, MD       ------

## 2013-06-27 NOTE — Telephone Encounter (Signed)
Spoke to patient. Result given . Verbalized understanding  

## 2013-06-27 NOTE — Telephone Encounter (Signed)
Left message to call back  

## 2013-06-29 ENCOUNTER — Inpatient Hospital Stay (HOSPITAL_COMMUNITY)
Admission: EM | Admit: 2013-06-29 | Discharge: 2013-07-01 | DRG: 153 | Disposition: A | Discharge status: Home / Self Care | Payer: 59 | Attending: Internal Medicine | Admitting: Internal Medicine

## 2013-06-29 ENCOUNTER — Encounter (HOSPITAL_COMMUNITY): Payer: Self-pay | Admitting: Emergency Medicine

## 2013-06-29 ENCOUNTER — Emergency Department (HOSPITAL_COMMUNITY): Payer: 59

## 2013-06-29 DIAGNOSIS — J209 Acute bronchitis, unspecified: Secondary | ICD-10-CM | POA: Diagnosis present

## 2013-06-29 DIAGNOSIS — G4730 Sleep apnea, unspecified: Secondary | ICD-10-CM

## 2013-06-29 DIAGNOSIS — J101 Influenza due to other identified influenza virus with other respiratory manifestations: Secondary | ICD-10-CM

## 2013-06-29 DIAGNOSIS — Z87891 Personal history of nicotine dependence: Secondary | ICD-10-CM

## 2013-06-29 DIAGNOSIS — E785 Hyperlipidemia, unspecified: Secondary | ICD-10-CM | POA: Diagnosis present

## 2013-06-29 DIAGNOSIS — I2510 Atherosclerotic heart disease of native coronary artery without angina pectoris: Secondary | ICD-10-CM | POA: Diagnosis present

## 2013-06-29 DIAGNOSIS — R509 Fever, unspecified: Secondary | ICD-10-CM

## 2013-06-29 DIAGNOSIS — Z79899 Other long term (current) drug therapy: Secondary | ICD-10-CM

## 2013-06-29 DIAGNOSIS — M109 Gout, unspecified: Secondary | ICD-10-CM | POA: Diagnosis present

## 2013-06-29 DIAGNOSIS — I4891 Unspecified atrial fibrillation: Secondary | ICD-10-CM

## 2013-06-29 DIAGNOSIS — I10 Essential (primary) hypertension: Secondary | ICD-10-CM | POA: Diagnosis present

## 2013-06-29 DIAGNOSIS — Z6841 Body Mass Index (BMI) 40.0 and over, adult: Secondary | ICD-10-CM

## 2013-06-29 DIAGNOSIS — E1169 Type 2 diabetes mellitus with other specified complication: Secondary | ICD-10-CM | POA: Diagnosis present

## 2013-06-29 DIAGNOSIS — Z8249 Family history of ischemic heart disease and other diseases of the circulatory system: Secondary | ICD-10-CM

## 2013-06-29 DIAGNOSIS — J111 Influenza due to unidentified influenza virus with other respiratory manifestations: Principal | ICD-10-CM | POA: Diagnosis present

## 2013-06-29 DIAGNOSIS — E860 Dehydration: Secondary | ICD-10-CM | POA: Diagnosis present

## 2013-06-29 DIAGNOSIS — Z9861 Coronary angioplasty status: Secondary | ICD-10-CM

## 2013-06-29 DIAGNOSIS — Z9010 Acquired absence of unspecified breast and nipple: Secondary | ICD-10-CM

## 2013-06-29 DIAGNOSIS — Z853 Personal history of malignant neoplasm of breast: Secondary | ICD-10-CM

## 2013-06-29 DIAGNOSIS — E119 Type 2 diabetes mellitus without complications: Secondary | ICD-10-CM | POA: Diagnosis present

## 2013-06-29 DIAGNOSIS — G4733 Obstructive sleep apnea (adult) (pediatric): Secondary | ICD-10-CM | POA: Diagnosis present

## 2013-06-29 DIAGNOSIS — I4821 Permanent atrial fibrillation: Secondary | ICD-10-CM | POA: Diagnosis present

## 2013-06-29 DIAGNOSIS — J45901 Unspecified asthma with (acute) exacerbation: Secondary | ICD-10-CM | POA: Diagnosis present

## 2013-06-29 DIAGNOSIS — R7989 Other specified abnormal findings of blood chemistry: Secondary | ICD-10-CM | POA: Diagnosis present

## 2013-06-29 LAB — BASIC METABOLIC PANEL
BUN: 17 mg/dL (ref 6–23)
CHLORIDE: 101 meq/L (ref 96–112)
CO2: 26 mEq/L (ref 19–32)
Calcium: 9.3 mg/dL (ref 8.4–10.5)
Creatinine, Ser: 1.07 mg/dL (ref 0.50–1.10)
GFR, EST AFRICAN AMERICAN: 62 mL/min — AB (ref >90)
GFR, EST NON AFRICAN AMERICAN: 54 mL/min — AB (ref >90)
Glucose, Bld: 122 mg/dL — ABNORMAL HIGH (ref 70–99)
Potassium: 3.5 mEq/L — ABNORMAL LOW (ref 3.7–5.3)
SODIUM: 140 meq/L (ref 137–147)

## 2013-06-29 LAB — POCT I-STAT TROPONIN I: TROPONIN I, POC: 0.04 ng/mL (ref 0.00–0.08)

## 2013-06-29 LAB — CBC
HCT: 33.4 % — ABNORMAL LOW (ref 36.0–46.0)
HEMOGLOBIN: 10.7 g/dL — AB (ref 12.0–15.0)
MCH: 26.4 pg (ref 26.0–34.0)
MCHC: 32 g/dL (ref 30.0–36.0)
MCV: 82.3 fL (ref 78.0–100.0)
Platelets: 145 10*3/uL — ABNORMAL LOW (ref 150–400)
RBC: 4.06 MIL/uL (ref 3.87–5.11)
RDW: 16.5 % — ABNORMAL HIGH (ref 11.5–15.5)
WBC: 7.3 10*3/uL (ref 4.0–10.5)

## 2013-06-29 LAB — INFLUENZA PANEL BY PCR (TYPE A & B)
H1N1FLUPCR: NOT DETECTED
INFLBPCR: NEGATIVE
Influenza A By PCR: POSITIVE — AB

## 2013-06-29 LAB — GLUCOSE, CAPILLARY
Glucose-Capillary: 124 mg/dL — ABNORMAL HIGH (ref 70–99)
Glucose-Capillary: 169 mg/dL — ABNORMAL HIGH (ref 70–99)
Glucose-Capillary: 212 mg/dL — ABNORMAL HIGH (ref 70–99)
Glucose-Capillary: 135 mg/dL — ABNORMAL HIGH (ref 70–99)

## 2013-06-29 MED ORDER — ACETAMINOPHEN 325 MG PO TABS
650.0000 mg | ORAL_TABLET | Freq: Four times a day (QID) | ORAL | Status: DC | PRN
  Administered 2013-06-29 (×2): 650 mg via ORAL
  Filled 2013-06-29 (×2): qty 2

## 2013-06-29 MED ORDER — GUAIFENESIN ER 600 MG PO TB12
600.0000 mg | ORAL_TABLET | Freq: Two times a day (BID) | ORAL | Status: DC
  Administered 2013-06-29 – 2013-07-01 (×5): 600 mg via ORAL
  Filled 2013-06-29 (×6): qty 1

## 2013-06-29 MED ORDER — LEVOFLOXACIN IN D5W 750 MG/150ML IV SOLN
750.0000 mg | Freq: Once | INTRAVENOUS | Status: AC
  Administered 2013-06-29: 750 mg via INTRAVENOUS
  Filled 2013-06-29: qty 150

## 2013-06-29 MED ORDER — ONDANSETRON HCL 4 MG/2ML IJ SOLN: 4.0000 mg | Freq: Four times a day (QID) | INTRAMUSCULAR | Status: DC | PRN

## 2013-06-29 MED ORDER — METHYLPREDNISOLONE SODIUM SUCC 125 MG IJ SOLR
60.0000 mg | INTRAMUSCULAR | Status: DC
  Administered 2013-06-29 – 2013-06-30 (×6): 60 mg via INTRAVENOUS
  Filled 2013-06-29: qty 2
  Filled 2013-06-29 (×12): qty 0.96

## 2013-06-29 MED ORDER — PREDNISONE 50 MG PO TABS: 50.0000 mg | ORAL_TABLET | Freq: Every day | ORAL | Status: DC

## 2013-06-29 MED ORDER — METHYLPREDNISOLONE SODIUM SUCC 125 MG IJ SOLR
125.0000 mg | Freq: Once | INTRAMUSCULAR | Status: AC
  Administered 2013-06-29: 125 mg via INTRAVENOUS
  Filled 2013-06-29: qty 2

## 2013-06-29 MED ORDER — LEVALBUTEROL HCL 0.63 MG/3ML IN NEBU
0.6300 mg | INHALATION_SOLUTION | Freq: Four times a day (QID) | RESPIRATORY_TRACT | Status: DC
  Administered 2013-06-29 (×3): 0.63 mg via RESPIRATORY_TRACT
  Filled 2013-06-29 (×3): qty 3

## 2013-06-29 MED ORDER — POTASSIUM CHLORIDE CRYS ER 20 MEQ PO TBCR
40.0000 meq | EXTENDED_RELEASE_TABLET | Freq: Once | ORAL | Status: AC
  Administered 2013-06-29: 40 meq via ORAL
  Filled 2013-06-29: qty 2

## 2013-06-29 MED ORDER — MENTHOL 3 MG MT LOZG
1.0000 | LOZENGE | OROMUCOSAL | Status: DC | PRN
  Administered 2013-06-29: 3 mg via ORAL
  Filled 2013-06-29 (×2): qty 9

## 2013-06-29 MED ORDER — DABIGATRAN ETEXILATE MESYLATE 150 MG PO CAPS
150.0000 mg | ORAL_CAPSULE | Freq: Two times a day (BID) | ORAL | Status: DC
  Administered 2013-06-29 – 2013-07-01 (×5): 150 mg via ORAL
  Filled 2013-06-29 (×7): qty 1

## 2013-06-29 MED ORDER — LEVALBUTEROL HCL 0.63 MG/3ML IN NEBU: 0.6300 mg | INHALATION_SOLUTION | Freq: Four times a day (QID) | RESPIRATORY_TRACT | Status: DC | PRN

## 2013-06-29 MED ORDER — ONDANSETRON HCL 4 MG PO TABS: 4.0000 mg | ORAL_TABLET | Freq: Four times a day (QID) | ORAL | Status: DC | PRN

## 2013-06-29 MED ORDER — ACETAMINOPHEN 650 MG RE SUPP: 650.0000 mg | Freq: Four times a day (QID) | RECTAL | Status: DC | PRN

## 2013-06-29 MED ORDER — ALBUTEROL SULFATE (2.5 MG/3ML) 0.083% IN NEBU
5.0000 mg | INHALATION_SOLUTION | Freq: Once | RESPIRATORY_TRACT | Status: AC
  Administered 2013-06-29: 5 mg via RESPIRATORY_TRACT
  Filled 2013-06-29: qty 6

## 2013-06-29 MED ORDER — IPRATROPIUM BROMIDE 0.02 % IN SOLN
0.5000 mg | RESPIRATORY_TRACT | Status: DC
  Administered 2013-06-29 (×2): 0.5 mg via RESPIRATORY_TRACT
  Filled 2013-06-29 (×2): qty 2.5

## 2013-06-29 MED ORDER — HYDROCHLOROTHIAZIDE 25 MG PO TABS
25.0000 mg | ORAL_TABLET | Freq: Every day | ORAL | Status: DC
  Administered 2013-06-29 – 2013-06-30 (×2): 25 mg via ORAL
  Filled 2013-06-29 (×2): qty 1

## 2013-06-29 MED ORDER — EZETIMIBE 10 MG PO TABS
10.0000 mg | ORAL_TABLET | Freq: Every day | ORAL | Status: DC
  Administered 2013-06-29 – 2013-07-01 (×3): 10 mg via ORAL
  Filled 2013-06-29 (×4): qty 1

## 2013-06-29 MED ORDER — LORAZEPAM 0.5 MG PO TABS
0.5000 mg | ORAL_TABLET | Freq: Every evening | ORAL | Status: DC | PRN
  Administered 2013-06-29 – 2013-06-30 (×2): 0.5 mg via ORAL
  Filled 2013-06-29 (×2): qty 1

## 2013-06-29 MED ORDER — OSELTAMIVIR PHOSPHATE 75 MG PO CAPS
75.0000 mg | ORAL_CAPSULE | Freq: Two times a day (BID) | ORAL | Status: DC
  Administered 2013-06-29 – 2013-06-30 (×3): 75 mg via ORAL
  Filled 2013-06-29 (×4): qty 1

## 2013-06-29 MED ORDER — BENZONATATE 100 MG PO CAPS
100.0000 mg | ORAL_CAPSULE | Freq: Three times a day (TID) | ORAL | Status: DC | PRN
  Administered 2013-06-29: 100 mg via ORAL
  Filled 2013-06-29 (×3): qty 1

## 2013-06-29 MED ORDER — INSULIN ASPART 100 UNIT/ML ~~LOC~~ SOLN
0.0000 [IU] | Freq: Three times a day (TID) | SUBCUTANEOUS | Status: DC
  Administered 2013-06-29: 5 [IU] via SUBCUTANEOUS
  Administered 2013-06-29: 3 [IU] via SUBCUTANEOUS
  Administered 2013-06-30 (×2): 5 [IU] via SUBCUTANEOUS
  Administered 2013-06-30: 2 [IU] via SUBCUTANEOUS
  Administered 2013-07-01: 3 [IU] via SUBCUTANEOUS
  Filled 2013-06-29 (×2): qty 1

## 2013-06-29 MED ORDER — NITROGLYCERIN 0.4 MG SL SUBL: 0.4000 mg | SUBLINGUAL_TABLET | SUBLINGUAL | Status: DC | PRN

## 2013-06-29 MED ORDER — SODIUM CHLORIDE 0.9 % IV SOLN
INTRAVENOUS | Status: DC
  Administered 2013-06-29: 04:00:00 via INTRAVENOUS

## 2013-06-29 MED ORDER — LEVOFLOXACIN IN D5W 750 MG/150ML IV SOLN
750.0000 mg | INTRAVENOUS | Status: DC
  Administered 2013-06-30: 750 mg via INTRAVENOUS
  Filled 2013-06-29: qty 150

## 2013-06-29 MED ORDER — FUROSEMIDE 80 MG PO TABS
80.0000 mg | ORAL_TABLET | Freq: Two times a day (BID) | ORAL | Status: DC
  Administered 2013-06-29 – 2013-06-30 (×2): 80 mg via ORAL
  Filled 2013-06-29 (×5): qty 1

## 2013-06-29 MED ORDER — PANTOPRAZOLE SODIUM 40 MG PO TBEC: 40.0000 mg | DELAYED_RELEASE_TABLET | Freq: Every day | ORAL | Status: DC

## 2013-06-29 MED ORDER — FENTANYL CITRATE 0.05 MG/ML IJ SOLN
50.0000 ug | INTRAMUSCULAR | Status: DC | PRN
  Administered 2013-06-29: 50 ug via INTRAVENOUS
  Filled 2013-06-29: qty 2

## 2013-06-29 MED ORDER — INSULIN ASPART 100 UNIT/ML ~~LOC~~ SOLN: 0.0000 [IU] | Freq: Every day | SUBCUTANEOUS | Status: DC

## 2013-06-29 MED ORDER — IRBESARTAN 300 MG PO TABS
300.0000 mg | ORAL_TABLET | Freq: Every day | ORAL | Status: DC
  Administered 2013-06-29 – 2013-06-30 (×2): 300 mg via ORAL
  Filled 2013-06-29 (×3): qty 1

## 2013-06-29 MED ORDER — ISOSORBIDE MONONITRATE ER 30 MG PO TB24
30.0000 mg | ORAL_TABLET | Freq: Every day | ORAL | Status: DC
  Administered 2013-06-29 – 2013-07-01 (×3): 30 mg via ORAL
  Filled 2013-06-29 (×4): qty 1

## 2013-06-29 MED ORDER — VALSARTAN-HYDROCHLOROTHIAZIDE 320-25 MG PO TABS: 1.0000 | ORAL_TABLET | Freq: Every day | ORAL | Status: DC

## 2013-06-29 MED ORDER — FLUTICASONE PROPIONATE 50 MCG/ACT NA SUSP
2.0000 | NASAL | Status: DC | PRN
  Filled 2013-06-29: qty 16

## 2013-06-29 MED ORDER — ALBUTEROL SULFATE (2.5 MG/3ML) 0.083% IN NEBU: 2.5000 mg | INHALATION_SOLUTION | RESPIRATORY_TRACT | Status: DC | PRN

## 2013-06-29 MED ORDER — DILTIAZEM HCL 25 MG/5ML IV SOLN
10.0000 mg | Freq: Once | INTRAVENOUS | Status: AC
  Administered 2013-06-29: 10 mg via INTRAVENOUS
  Filled 2013-06-29: qty 5

## 2013-06-29 MED ORDER — FERROUS SULFATE 325 (65 FE) MG PO TABS
325.0000 mg | ORAL_TABLET | Freq: Every day | ORAL | Status: DC
  Administered 2013-06-29 – 2013-07-01 (×3): 325 mg via ORAL
  Filled 2013-06-29 (×4): qty 1

## 2013-06-29 MED ORDER — NEBIVOLOL HCL 5 MG PO TABS
5.0000 mg | ORAL_TABLET | Freq: Every day | ORAL | Status: DC
Start: 2013-06-29 — End: 2013-07-01
  Administered 2013-06-29 – 2013-07-01 (×3): 5 mg via ORAL
  Filled 2013-06-29 (×3): qty 1

## 2013-06-29 MED ORDER — ONDANSETRON HCL 4 MG/2ML IJ SOLN
4.0000 mg | Freq: Once | INTRAMUSCULAR | Status: AC
  Administered 2013-06-29: 4 mg via INTRAVENOUS
  Filled 2013-06-29: qty 2

## 2013-06-29 NOTE — Progress Notes (Signed)
UR completed 

## 2013-06-29 NOTE — H&P (Signed)
[facility] History and Physical  Patient: Stephanie Franco  [id number]  DOB: 02-Dec-1950  DOS: the patient was seen and examined on 06/29/2013 PCP: Salena Saner., MD  Chief Complaint: Cough and shortness of breath  HPI: Stephanie Franco is a 63 y.o. female with Past medical history of coronary artery disease, asthma, hypertension, dyslipidemia, obstructive sleep apnea, atrial fibrillation on PRADEXA. The patient is coming from home. The patient presented with complaints of cough and shortness of breath that has been ongoing since last one week progressively worsening initially started as a dry cough worsened to productive cough with yellowish expectoration without any blood. She started having shortness of breath initially was on exertion and now even at rest and that's why she came to the hospital. She has fever with chills no sick contacts. She denies any recent travel or recent immobilization. She has history of heart failure and has been taking her Lasix and a regular basis denies any weight gain or weight loss. Denies any orthopnea or PND. She is compliant with all her medication.  Review of Systems: as mentioned in the history of present illness.  A Comprehensive review of the other systems is negative.  Past Medical History  Diagnosis Date  . CAD S/P percutaneous coronary angioplasty 2006;     PCI of circumflex with Taxus DES;; relook-cath Feb 2013: 50-60% short lesion in RCA, 40% ISR Circumflex stent.  . Stented coronary artery     40% ISR in Circumflex stent  . Hypertension   . Hyperlipidemia   . Morbid obesity     BMI 41  . Breast cancer 2008    S/P mastectomy  . Atrial fibrillation, permanent   . Asthma   . 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 OSA on CPAP     On CPAP  . Gout   . Diabetes mellitus, uncontrolled 07/14/2011  . History of echocardiogram 07/22/2011  . Edema of both legs     Usually mild, chronic. Controlled with when necessary  furosemide and diet   Past Surgical History  Procedure Laterality Date  . Coronary angioplasty with stent placement  2006    stent to circumflex  . Cardiac catheterization  07/13/11    normal EF; 63mm 50-60% lesion in RCA; patent stent in circumflex form 2006 with ~40% in-stent re-stenosis  . Mastectomy  2008    left  . Transthoracic echocardiogram  March 2013    LV borderline dilated; borderline concentric LVH; EF 99991111; RV sysolic function mildly reduced; LA severely dilated; RA mod-severely dilated; mild-mod MR; mild TR; mod pulm HTN;    Social History:  reports that she quit smoking about 21 years ago. Her smoking use included Cigarettes. She has a 3 pack-year smoking history. She has never used smokeless tobacco. She reports that she does not drink alcohol or use illicit drugs. Independent for most of her  ADL.  No Known Allergies  Family History  Problem Relation Age of Onset  . Coronary artery disease Mother   . Hypertension Mother   . Atrial fibrillation Son     Prior to Admission medications   Medication Sig Start Date End Date Taking? Authorizing Provider  albuterol (PROVENTIL HFA;VENTOLIN HFA) 108 (90 BASE) MCG/ACT inhaler Inhale 2 puffs into the lungs every 6 (six) hours as needed for wheezing or shortness of breath.   Yes Historical Provider, MD  allopurinol (ZYLOPRIM) 300 MG tablet Take 300 mg by mouth daily as needed. Gout   Yes  Historical Provider, MD  cholecalciferol (VITAMIN D) 1000 UNITS tablet Take 2,000 Units by mouth daily.   Yes Historical Provider, MD  colchicine 0.6 MG tablet Take 0.6 mg by mouth daily as needed. gout   Yes Historical Provider, MD  dabigatran (PRADAXA) 150 MG CAPS capsule Take 1 capsule (150 mg total) by mouth every 12 (twelve) hours. 06/22/13  Yes Leonie Man, MD  ezetimibe (ZETIA) 10 MG tablet Take 1 tablet (10 mg total) by mouth daily. 05/25/13  Yes Leonie Man, MD  ferrous sulfate 325 (65 FE) MG EC tablet Take 325 mg by mouth daily  with breakfast.   Yes Historical Provider, MD  fluticasone (FLONASE) 50 MCG/ACT nasal spray Place 2 sprays into the nose as needed for rhinitis.   Yes Historical Provider, MD  furosemide (LASIX) 80 MG tablet Take 0.5-1 tablets (40-80 mg total) by mouth 2 (two) times daily. Dose based on amount of swelling 12/19/12  Yes Leonie Man, MD  isosorbide mononitrate (IMDUR) 30 MG 24 hr tablet Take 1 tablet (30 mg total) by mouth daily. 12/19/12  Yes Leonie Man, MD  LORazepam (ATIVAN) 0.5 MG tablet Take 0.5 mg by mouth at bedtime as needed. Anxiety   Yes Historical Provider, MD  metFORMIN (GLUMETZA) 1000 MG (MOD) 24 hr tablet Take 1,000 mg by mouth 2 (two) times daily with a meal.    Yes Historical Provider, MD  Multiple Vitamins-Minerals (MULTIVITAMIN WITH MINERALS) tablet Take 1 tablet by mouth daily.   Yes Historical Provider, MD  nebivolol (BYSTOLIC) 5 MG tablet Take 1 tablet (5 mg total) by mouth daily. 06/22/13  Yes Leonie Man, MD  nitroGLYCERIN (NITROSTAT) 0.4 MG SL tablet Place 1 tablet (0.4 mg total) under the tongue every 5 (five) minutes as needed. 12/19/12  Yes Leonie Man, MD  pantoprazole (PROTONIX) 40 MG tablet Take 1 tablet (40 mg total) by mouth daily. 12/19/12  Yes Leonie Man, MD  potassium chloride SA (K-DUR,KLOR-CON) 20 MEQ tablet Take 20 mEq by mouth 2 (two) times daily.    Yes Historical Provider, MD  valsartan-hydrochlorothiazide (DIOVAN-HCT) 320-25 MG per tablet Take 1 tablet by mouth daily.   Yes Historical Provider, MD    Physical Exam: Filed Vitals:   06/29/13 0328 06/29/13 0347 06/29/13 0457  BP:  150/83 128/68  Pulse:   83  Temp:  103 F (39.4 C) 102.9 F (39.4 C)  TempSrc:  Oral Oral  Resp:  33 18  SpO2: 100% 93% 95%    General: Alert, Awake and Oriented to Time, Place and Person. Appear in moderate distress Eyes: PERRL ENT: Oral Mucosa clear moist. Neck: No JVD Cardiovascular: S1 and S2 Present, no Murmur, Peripheral Pulses Present Respiratory:  Bilateral Air entry equal and Decreased, bilateral basal Crackles, extensive expiratory wheezes Abdomen: Bowel Sound Present, Soft and Non tender Skin: No Rash Extremities: Trace Pedal edema, no calf tenderness Neurologic: Grossly Unremarkable.  Labs on Admission:  CBC:  Recent Labs Lab 06/29/13 0405  WBC 7.3  HGB 10.7*  HCT 33.4*  MCV 82.3  PLT 145*    CMP     Component Value Date/Time   NA 140 06/29/2013 0405   K 3.5* 06/29/2013 0405   CL 101 06/29/2013 0405   CO2 26 06/29/2013 0405   GLUCOSE 122* 06/29/2013 0405   BUN 17 06/29/2013 0405   CREATININE 1.07 06/29/2013 0405   CREATININE 1.10 06/22/2013 1613   CALCIUM 9.3 06/29/2013 0405   PROT 6.0 12/23/2012 1129  ALBUMIN 3.9 12/23/2012 1129   AST 29 12/23/2012 1129   ALT 22 12/23/2012 1129   ALKPHOS 62 12/23/2012 1129   BILITOT 0.7 12/23/2012 1129   GFRNONAA 54* 06/29/2013 0405   GFRAA 62* 06/29/2013 0405    No results found for this basename: LIPASE, AMYLASE,  in the last 168 hours No results found for this basename: AMMONIA,  in the last 168 hours  No results found for this basename: CKTOTAL, CKMB, CKMBINDEX, TROPONINI,  in the last 168 hours BNP (last 3 results) No results found for this basename: PROBNP,  in the last 8760 hours  Radiological Exams on Admission: Dg Chest 2 View (if Patient Has Fever And/or Copd)  06/29/2013   CLINICAL DATA:  Short of breath.  Fever.  Influenza.  EXAM: CHEST  2 VIEW  COMPARISON:  DG CHEST 2 VIEW dated 05/05/2013  FINDINGS: Unchanged cardiomegaly. Pulmonary vascular congestion is present. No definite edema. Mediastinal contours are unchanged. There is no focal airspace disease. Monitoring leads project over the chest.  IMPRESSION: Cardiomegaly and pulmonary vascular congestion.   Electronically Signed   By: Dereck Ligas M.D.   On: 06/29/2013 04:28    EKG: Independently reviewed. atrial fibrillation.  Assessment/Plan Principal Problem:   Asthma exacerbation Active Problems:   Hypertension    Hyperlipidemia   Sleep apnea, on C-Pap   Diabetes mellitus   CAD S/P percutaneous coronary angioplasty   Atrial fibrillation, permanent   1. Asthma exacerbation The patient is presenting with complaints of cough and shortness of breath with wheezing. She is hypoxic requiring oxygen and has tachycardia tachypnea. With this I will admit her to the hospital, check influenza PCR and sputum culture, place her on IV levofloxacin and Tamiflu. I would also place her on duo nebs and oxygen as needed. Once influenza PCR is negative she can be placed on steroids. Lozenges and Tessalon Perles  2. Coronary artery disease atrial fibrillation and possible diastolic dysfunction. Continue Lasix, Avapro, hydrochlorothiazide, beta blocker, Imdur. Continue PRADEXA. Monitor telemetry. Since she has rapid ventricular rate after nebulization I would attempt one dose of IV Cardizem to control her heart rate.  3. Sleep apnea  CPAP each bedtime  4. Diabetes mellitus Holding metformin and placing her on sliding scale  DVT Prophylaxis:mechanical compression device Nutrition: Advance as tolerated cardiac and diabetic  Code Status: Full  Family Communication: Husband was present at bedside, opportunity was given to ask question and all questions were answered satisfactorily at the time of interview. Disposition: Admitted to inpatient in telemetry unit.  Author: Berle Mull, MD [facility] Pager: [phone] 06/29/2013, 5:26 AM    If 7PM-7AM, please contact night-coverage [email or web address] Password [email or web address]

## 2013-06-29 NOTE — Care Management Note (Signed)
    Page 1 of 1   06/29/2013     3:45:36 PM   CARE MANAGEMENT NOTE 06/29/2013  Patient:  Stephanie Bourbon A.   Account Number:  [id number]  Date Initiated:  06/29/2013  Documentation initiated by:  [facility]  Subjective/Objective Assessment:   63 Y/O F ADMITTED W/ASTHMA EXAC.     Action/Plan:   FROM HOME.HAS PCP,PHARMACY.   Anticipated DC Date:  07/01/2013   Anticipated DC Plan:  [place]  CM consult      Choice offered to / List presented to:             Status of service:  In process, will continue to follow Medicare Important Message given?   (If response is "NO", the following Medicare IM given date fields will be blank) Date Medicare IM given:   Date Additional Medicare IM given:    Discharge Disposition:    Per UR Regulation:  Reviewed for med. necessity/level of care/duration of stay  If discussed at [place] of Stay Meetings, dates discussed:    Comments:  06/29/13 [facility] RN,BSN NCM [id number]

## 2013-06-29 NOTE — ED Notes (Signed)
Blood sugar 124

## 2013-06-29 NOTE — ED Provider Notes (Signed)
CSN: [id number]     Arrival date & time 06/29/13  [id number] History   First MD Initiated Contact with Patient 06/29/13 0350     Chief Complaint  Patient presents with  . Influenza  . Shortness of Breath     (Consider location/radiation/quality/duration/timing/severity/associated sxs/prior Treatment) HPI History provided by patient. Fever cough and dyspnea worse over the last few days. Fever started yesterday and worse tonight, 103 in triage. Patient has some green sputum production. No hemoptysis. No chest pain. Does complain of a mild headache and body aches generalized weakness. No nausea vomiting or diarrhea.  No abdominal pain. No back pain. Patient did receive a flu shot dysuria. She denies any known sick contacts. She has a history of atrial fibrillation and asthma. She has persistent wheezing despite using her inhaler at home Past Medical History  Diagnosis Date  . CAD S/P percutaneous coronary angioplasty 2006;     PCI of circumflex with Taxus DES;; relook-cath Feb 2013: 50-60% short lesion in RCA, 40% ISR Circumflex stent.  . Stented coronary artery     40% ISR in Circumflex stent  . Hypertension   . Hyperlipidemia   . Morbid obesity     BMI 41  . Breast cancer 2008    S/P mastectomy  . Atrial fibrillation, permanent   . Asthma   . 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 OSA on CPAP     On CPAP  . Gout   . Diabetes mellitus, uncontrolled 07/14/2011  . History of echocardiogram 07/22/2011  . Edema of both legs     Usually mild, chronic. Controlled with when necessary furosemide and diet   Past Surgical History  Procedure Laterality Date  . Coronary angioplasty with stent placement  2006    stent to circumflex  . Cardiac catheterization  07/13/11    normal EF; 64mm 50-60% lesion in RCA; patent stent in circumflex form 2006 with ~40% in-stent re-stenosis  . Mastectomy  2008    left  . Transthoracic echocardiogram  March 2013    LV borderline dilated; borderline  concentric LVH; EF [id number]; RV sysolic function mildly reduced; LA severely dilated; RA mod-severely dilated; mild-mod MR; mild TR; mod pulm HTN;    Family History  Problem Relation Age of Onset  . Coronary artery disease Mother   . Hypertension Mother   . Atrial fibrillation Son    History  Substance Use Topics  . Smoking status: Former Smoker -- 0.50 packs/day for 6 years    Types: Cigarettes    Quit date: 05/18/1992  . Smokeless tobacco: Never Used  . Alcohol Use: No     Comment: 07/13/11 "have drank occasionally; not now"   OB History   Grav Para Term Preterm Abortions TAB SAB Ect Mult Living                 Review of Systems  Constitutional: Positive for fever and chills.  Respiratory: Positive for cough, shortness of breath and wheezing.   Cardiovascular: Negative for chest pain.  Gastrointestinal: Negative for vomiting and abdominal pain.  Genitourinary: Negative for flank pain.  Musculoskeletal: Negative for neck pain and neck stiffness.  Skin: Negative for rash.  Neurological: Positive for headaches. Negative for syncope.  All other systems reviewed and are negative.      Allergies  Review of patient's allergies indicates no known allergies.  Home Medications   Current Outpatient Rx  Name  Route  Sig  Dispense  Refill  .  albuterol (PROVENTIL HFA;VENTOLIN HFA) 108 (90 BASE) MCG/ACT inhaler   Inhalation   Inhale 2 puffs into the lungs every 6 (six) hours as needed for wheezing or shortness of breath.         . allopurinol (ZYLOPRIM) 300 MG tablet   Oral   Take 300 mg by mouth daily as needed. Gout         . cholecalciferol (VITAMIN D) 1000 UNITS tablet   Oral   Take 2,000 Units by mouth daily.         . colchicine 0.6 MG tablet   Oral   Take 0.6 mg by mouth daily as needed. gout         . dabigatran (PRADAXA) 150 MG CAPS capsule   Oral   Take 1 capsule (150 mg total) by mouth every 12 (twelve) hours.   48 capsule   0     Lot: [id number] Exp:  07/2014   . ezetimibe (ZETIA) 10 MG tablet   Oral   Take 1 tablet (10 mg total) by mouth daily.   28 tablet   0     Lot: [id number] Exp: 08/2015.   . ferrous sulfate 325 (65 FE) MG EC tablet   Oral   Take 325 mg by mouth daily with breakfast.         . fluticasone (FLONASE) 50 MCG/ACT nasal spray   Nasal   Place 2 sprays into the nose as needed for rhinitis.         . furosemide (LASIX) 80 MG tablet   Oral   Take 0.5-1 tablets (40-80 mg total) by mouth 2 (two) times daily. Dose based on amount of swelling   60 tablet   11   . isosorbide mononitrate (IMDUR) 30 MG 24 hr tablet   Oral   Take 1 tablet (30 mg total) by mouth daily.   30 tablet   11   . LORazepam (ATIVAN) 0.5 MG tablet   Oral   Take 0.5 mg by mouth at bedtime as needed. Anxiety         . metFORMIN (GLUMETZA) 1000 MG (MOD) 24 hr tablet   Oral   Take 1,000 mg by mouth 2 (two) times daily with a meal.          . Multiple Vitamins-Minerals (MULTIVITAMIN WITH MINERALS) tablet   Oral   Take 1 tablet by mouth daily.         . nebivolol (BYSTOLIC) 5 MG tablet   Oral   Take 1 tablet (5 mg total) by mouth daily.   28 tablet   0     [id number] Exp: 05/2015.   . nitroGLYCERIN (NITROSTAT) 0.4 MG SL tablet   Sublingual   Place 1 tablet (0.4 mg total) under the tongue every 5 (five) minutes as needed.   25 tablet   6   . pantoprazole (PROTONIX) 40 MG tablet   Oral   Take 1 tablet (40 mg total) by mouth daily.   30 tablet   11   . potassium chloride SA (K-DUR,KLOR-CON) 20 MEQ tablet   Oral   Take 20 mEq by mouth 2 (two) times daily.          . rosuvastatin (CRESTOR) 10 MG tablet   Oral   Take 10 mg by mouth daily.         . valsartan-hydrochlorothiazide (DIOVAN-HCT) 320-25 MG per tablet   Oral   Take 1 tablet by mouth daily.  BP 150/83  Temp(Src) 103 F (39.4 C) (Oral)  Resp 33  SpO2 93% Physical Exam  Constitutional: She is oriented to person, place, and time. She  appears well-developed and well-nourished.  HENT:  Head: Normocephalic and atraumatic.  Mouth/Throat: Oropharynx is clear and moist.  Eyes: EOM are normal. Pupils are equal, round, and reactive to light. No scleral icterus.  Neck: Neck supple.  Cardiovascular: Intact distal pulses.   Tachycardic, irregular  Pulmonary/Chest:  Tachypnea, decreased breath sounds with expiratory wheezes  Abdominal: Soft. There is no tenderness.  Musculoskeletal: Normal range of motion. She exhibits no edema and no tenderness.  Neurological: She is alert and oriented to person, place, and time. No cranial nerve deficit.  Skin: Skin is warm and dry.    ED Course  Procedures (including critical care time) Labs Review Labs Reviewed  CBC - Abnormal; Notable for the following:    Hemoglobin 10.7 (*)    HCT 33.4 (*)    RDW 16.5 (*)    Platelets 145 (*)    All other components within normal limits  BASIC METABOLIC PANEL  INFLUENZA PANEL BY PCR (TYPE A & B, H1N1)  POCT I-STAT TROPONIN I   Imaging Review No results found.  EKG Interpretation    Date/Time:  Thursday June 29 2013 03:46:50 EST Ventricular Rate:  111 PR Interval:    QRS Duration: 124 QT Interval:  345 QTC Calculation: 469 R Axis:   -70 Text Interpretation:  Atrial fibrillation Ventricular premature complex Left bundle branch block Confirmed by Kendle Turbin  MD, Madelina Sanda [id number]) on 06/29/2013 4:13:00 AM           IV soluMedrol, albuterol treatments provided Tamiflu and IV Levaquin Tylenol for fever and fentanyl for headache  Recheck after single breathing treatments still wheezing. Albuterol reordered.   MED consult, DR Posey Pronto to admit  MDM   Dx: Fever, wheezing, respiratory infection  EKG, chest x-ray, labs Antibiotics and steroids. Albuterol provided MED admit  Teressa Lower, MD 06/29/13 [phone]

## 2013-06-29 NOTE — ED Notes (Signed)
Per EMS got called out for general weakness and shortness of breath  Sxs started on Monday  Pt is c/o body aches, fever, general weakness   Denies N/V/D  Pt saw her dr last week and had her blood checked and they said her potassium level is low

## 2013-06-29 NOTE — Progress Notes (Signed)
Patient admitted earlier this morning. H&P reviewed.  Patient feels better. No longer wheezing.denies chest pain.  Vitals reviewed.  Lungs: CTA Bil Heart: S1S2 normal regular, no S3S4. No rubs murmurs or bruits. Abdomen: Soft, NT, ND, BS present.  Patient presented with complaints of cough and shortness of breath. She was apparently wheezing when she initially presented to the hospital. She was also febrile. She was managed initially for asthma exacerbation. However, her influenza PCR is positive. This is most likely a sequelae of influenza. She's not wheezing at this time. Initiate Tamiflu.  She also has other medical issues including coronary artery disease, atrial fibrillation, diastolic dysfunction. Continue with her home medications, including anticoagulation. She also has sleep apnea, and will continue with CPAP at night.  Her husband was in the room with her and he was not wearing a mask. He has been exposed to influenza so I have recommended that he discuss this exposure further with his primary care provider. He might benefit from prophylactic treatment.  Bonnielee Haff 06/29/2013

## 2013-06-30 ENCOUNTER — Other Ambulatory Visit: Payer: Self-pay | Admitting: *Deleted

## 2013-06-30 DIAGNOSIS — J209 Acute bronchitis, unspecified: Secondary | ICD-10-CM | POA: Diagnosis present

## 2013-06-30 DIAGNOSIS — J111 Influenza due to unidentified influenza virus with other respiratory manifestations: Principal | ICD-10-CM

## 2013-06-30 LAB — CBC
HEMATOCRIT: 32.4 % — AB (ref 36.0–46.0)
Hemoglobin: 10.4 g/dL — ABNORMAL LOW (ref 12.0–15.0)
MCH: 26.5 pg (ref 26.0–34.0)
MCHC: 32.1 g/dL (ref 30.0–36.0)
MCV: 82.4 fL (ref 78.0–100.0)
PLATELETS: 140 10*3/uL — AB (ref 150–400)
RBC: 3.93 MIL/uL (ref 3.87–5.11)
RDW: 16.5 % — ABNORMAL HIGH (ref 11.5–15.5)
WBC: 6.8 10*3/uL (ref 4.0–10.5)

## 2013-06-30 LAB — BASIC METABOLIC PANEL
BUN: 29 mg/dL — AB (ref 6–23)
CALCIUM: 9.3 mg/dL (ref 8.4–10.5)
CO2: 25 meq/L (ref 19–32)
CREATININE: 1.25 mg/dL — AB (ref 0.50–1.10)
Chloride: 101 mEq/L (ref 96–112)
GFR calc Af Amer: 52 mL/min — ABNORMAL LOW (ref >90)
GFR calc non Af Amer: 44 mL/min — ABNORMAL LOW (ref >90)
Glucose, Bld: 156 mg/dL — ABNORMAL HIGH (ref 70–99)
Potassium: 3.8 mEq/L (ref 3.7–5.3)
Sodium: 141 mEq/L (ref 137–147)

## 2013-06-30 LAB — GLUCOSE, CAPILLARY
GLUCOSE-CAPILLARY: 203 mg/dL — AB (ref 70–99)
Glucose-Capillary: 159 mg/dL — ABNORMAL HIGH (ref 70–99)
Glucose-Capillary: 216 mg/dL — ABNORMAL HIGH (ref 70–99)
Glucose-Capillary: 143 mg/dL — ABNORMAL HIGH (ref 70–99)

## 2013-06-30 MED ORDER — METHYLPREDNISOLONE SODIUM SUCC 125 MG IJ SOLR
60.0000 mg | Freq: Four times a day (QID) | INTRAMUSCULAR | Status: DC
  Administered 2013-06-30 – 2013-07-01 (×4): 60 mg via INTRAVENOUS
  Filled 2013-06-30 (×7): qty 0.96

## 2013-06-30 MED ORDER — LEVALBUTEROL HCL 0.63 MG/3ML IN NEBU
0.6300 mg | INHALATION_SOLUTION | Freq: Three times a day (TID) | RESPIRATORY_TRACT | Status: DC
  Administered 2013-06-30 – 2013-07-01 (×4): 0.63 mg via RESPIRATORY_TRACT
  Filled 2013-06-30 (×10): qty 3

## 2013-06-30 MED ORDER — POTASSIUM CHLORIDE CRYS ER 20 MEQ PO TBCR: 20.0000 meq | EXTENDED_RELEASE_TABLET | Freq: Two times a day (BID) | ORAL | Status: DC

## 2013-06-30 MED ORDER — OSELTAMIVIR PHOSPHATE 75 MG PO CAPS
75.0000 mg | ORAL_CAPSULE | Freq: Every day | ORAL | Status: DC
  Administered 2013-07-01: 75 mg via ORAL
  Filled 2013-06-30: qty 1

## 2013-06-30 MED ORDER — FUROSEMIDE 80 MG PO TABS
80.0000 mg | ORAL_TABLET | Freq: Every day | ORAL | Status: DC
  Administered 2013-07-01: 80 mg via ORAL
  Filled 2013-06-30: qty 1

## 2013-06-30 MED ORDER — LEVOFLOXACIN 500 MG PO TABS
500.0000 mg | ORAL_TABLET | Freq: Every day | ORAL | Status: DC
  Administered 2013-06-30 – 2013-07-01 (×2): 500 mg via ORAL
  Filled 2013-06-30 (×2): qty 1

## 2013-06-30 NOTE — Progress Notes (Signed)
[facility] PROGRESS NOTE  Bryon Olden [id number] DOB: 18-Jul-1950 DOA: 06/29/2013  PCP: Salena Saner., MD  Brief HPI: Stephanie Franco is a 63 y.o. female with Past medical history of coronary artery disease, asthma, hypertension, dyslipidemia, obstructive sleep apnea, atrial fibrillation on PRADAXA. Patient presented with cough and shortness of breath that has been ongoing since last one week. She also reported fever.   Past medical history:  Past Medical History  Diagnosis Date  . CAD S/P percutaneous coronary angioplasty 2006;     PCI of circumflex with Taxus DES;; relook-cath Feb 2013: 50-60% short lesion in RCA, 40% ISR Circumflex stent.  . Stented coronary artery     40% ISR in Circumflex stent  . Hypertension   . Hyperlipidemia   . Morbid obesity     BMI 41  . Breast cancer 2008    S/P mastectomy  . Atrial fibrillation, permanent   . Asthma   . 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 OSA on CPAP     On CPAP  . Gout   . Diabetes mellitus, uncontrolled 07/14/2011  . History of echocardiogram 07/22/2011  . Edema of both legs     Usually mild, chronic. Controlled with when necessary furosemide and diet    Consultants: None  Procedures: None  Antibiotics: Tamiflu 2/12--> Levaquin 2/12-->  Subjective: Patient feels better but has been wheezing this morning. No nausea or vomiting. Coughing up yellow sputum.  Objective: Vital Signs  Filed Vitals:   06/29/13 2204 06/30/13 0526 06/30/13 0538 06/30/13 0815  BP: 114/53  123/73   Pulse: 83  63   Temp: 98.3 F (36.8 C)  98.1 F (36.7 C)   TempSrc: Oral  Oral   Resp: 18  20   Height:      Weight:  104.1 kg (229 lb 8 oz)    SpO2: 98%  99% 98%    Intake/Output Summary (Last 24 hours) at 06/30/13 1051 Last data filed at 06/30/13 0700  Gross per 24 hour  Intake    360 ml  Output      0 ml  Net    360 ml   Filed Weights   06/29/13 1412 06/30/13 0526  Weight: 104.5 kg (230 lb 6.1  oz) 104.1 kg (229 lb 8 oz)   General appearance: alert, cooperative, appears stated age, no distress and moderately obese Resp: end exp wheezing bilaterally. no definite crackles Cardio: regular rate and rhythm, S1, S2 normal, no murmur, click, rub or gallop GI: soft, non-tender; bowel sounds normal; no masses,  no organomegaly Extremities: edema minimal edema Pulses: 2+ and symmetric Neurologic: Alert and oriented X 3, normal strength and tone. Normal symmetric reflexes. Normal coordination and gait  Lab Results:  Basic Metabolic Panel:  Recent Labs Lab 06/29/13 0405 06/30/13 0410  NA 140 141  K 3.5* 3.8  CL 101 101  CO2 26 25  GLUCOSE 122* 156*  BUN 17 29*  CREATININE 1.07 1.25*  CALCIUM 9.3 9.3   CBC:  Recent Labs Lab 06/29/13 0405 06/30/13 0410  WBC 7.3 6.8  HGB 10.7* 10.4*  HCT 33.4* 32.4*  MCV 82.3 82.4  PLT 145* 140*   CBG:  Recent Labs Lab 06/29/13 0757 06/29/13 0911 06/29/13 1151 06/29/13 1644 06/29/13 2203  GLUCAP 124* 169* 212* 135* 159*     Studies/Results: Dg Chest 2 View (if Patient Has Fever And/or Copd)  06/29/2013   CLINICAL DATA:  Short of breath.  Fever.  Influenza.  EXAM: CHEST  2 VIEW  COMPARISON:  DG CHEST 2 VIEW dated 05/05/2013  FINDINGS: Unchanged cardiomegaly. Pulmonary vascular congestion is present. No definite edema. Mediastinal contours are unchanged. There is no focal airspace disease. Monitoring leads project over the chest.  IMPRESSION: Cardiomegaly and pulmonary vascular congestion.   Electronically Signed   By: Dereck Ligas M.D.   On: 06/29/2013 04:28    Medications:  Scheduled: . dabigatran  150 mg Oral Q12H  . ezetimibe  10 mg Oral Daily  . ferrous sulfate  325 mg Oral Q breakfast  . [START ON 07/01/2013] furosemide  80 mg Oral Daily  . guaiFENesin  600 mg Oral BID  . insulin aspart  0-15 Units Subcutaneous TID WC  . insulin aspart  0-5 Units Subcutaneous QHS  . isosorbide mononitrate  30 mg Oral Daily  .  levalbuterol  0.63 mg Nebulization TID  . levofloxacin  500 mg Oral Daily  . methylPREDNISolone (SOLU-MEDROL) injection  60 mg Intravenous Q4H  . nebivolol  5 mg Oral Daily  . [START ON 07/01/2013] oseltamivir  75 mg Oral Daily   Continuous:  [id number], acetaminophen, fentaNYL, fluticasone, levalbuterol, LORazepam, menthol-cetylpyridinium, nitroGLYCERIN, ondansetron (ZOFRAN) IV, ondansetron  Assessment/Plan:  Principal Problem:   Influenza A Active Problems:   Hypertension   Hyperlipidemia   Sleep apnea, on C-Pap   Diabetes mellitus   CAD S/P percutaneous coronary angioplasty   Atrial fibrillation, permanent   Asthma exacerbation    Influenza Continue Tamiflu. Afebrile currently.  Acute Bronchitis Likely as a result of above Since she does have wheezing and yellow sputum she could have secondary infection. Continue Levaquin for now. Continue steroids and Nebs.  Elevated Creatinine Likely as a result of Lasix in setting of mild dehydration. She is making urine. Will hold tonight's dose of lasix and will stop ARB. Recheck in AM. Allow to hydrate orally.  History of Coronary artery disease atrial fibrillation and possible diastolic dysfunction.  As above. She is euvolemic. Continue PRADAXA. HR is well controlled.  History of Sleep apnea  CPAP each bedtime   History of Diabetes mellitus  Holding metformin and placing her on sliding scale. Watch CBG while on steroids.  Code Status: Full Code  DVT Prophylaxis: On Pradaxa    Family Communication: Discussed with patient and husband  Disposition Plan: Anticipate discharge 2/14.    LOS: 1 day   [facility] Pager [phone] 06/30/2013, 10:51 AM  If 8PM-8AM, please contact night-coverage at [email or web address], password [facility]

## 2013-07-01 LAB — BASIC METABOLIC PANEL
BUN: 37 mg/dL — AB (ref 6–23)
CHLORIDE: 101 meq/L (ref 96–112)
CO2: 28 mEq/L (ref 19–32)
Calcium: 9.6 mg/dL (ref 8.4–10.5)
Creatinine, Ser: 1.43 mg/dL — ABNORMAL HIGH (ref 0.50–1.10)
GFR calc Af Amer: 44 mL/min — ABNORMAL LOW (ref >90)
GFR calc non Af Amer: 38 mL/min — ABNORMAL LOW (ref >90)
GLUCOSE: 166 mg/dL — AB (ref 70–99)
POTASSIUM: 4 meq/L (ref 3.7–5.3)
Sodium: 143 mEq/L (ref 137–147)

## 2013-07-01 MED ORDER — GUAIFENESIN ER 600 MG PO TB12: 600.0000 mg | ORAL_TABLET | Freq: Two times a day (BID) | ORAL | Status: DC

## 2013-07-01 MED ORDER — FUROSEMIDE 80 MG PO TABS: 40.0000 mg | ORAL_TABLET | Freq: Two times a day (BID) | ORAL | Status: DC

## 2013-07-01 MED ORDER — LEVOFLOXACIN 500 MG PO TABS: 500.0000 mg | ORAL_TABLET | Freq: Every day | ORAL | Status: DC

## 2013-07-01 MED ORDER — LEVALBUTEROL HCL 0.63 MG/3ML IN NEBU: 0.6300 mg | INHALATION_SOLUTION | Freq: Four times a day (QID) | RESPIRATORY_TRACT | Status: DC | PRN

## 2013-07-01 MED ORDER — OSELTAMIVIR PHOSPHATE 75 MG PO CAPS: 75.0000 mg | ORAL_CAPSULE | Freq: Every day | ORAL | Status: DC

## 2013-07-01 MED ORDER — PREDNISONE 20 MG PO TABS: ORAL_TABLET | ORAL | Status: DC

## 2013-07-01 NOTE — Discharge Summary (Signed)
[facility]  Physician Discharge Summary   Patient ID: Zaelee Biddix MRN: [id number] DOB/AGE: 65-Dec-1952 65 y.o.  Admit date: 06/29/2013 Discharge date: 07/01/2013  PCP: Salena Saner., MD  DISCHARGE DIAGNOSES:  Principal Problem:   Influenza A Active Problems:   Hypertension   Hyperlipidemia   Sleep apnea, on C-Pap   Diabetes mellitus   CAD S/P percutaneous coronary angioplasty   Atrial fibrillation, permanent   Acute bronchitis   RECOMMENDATIONS FOR OUTPATIENT FOLLOW UP: 1. Needs repeat Bmet mid week to check renal function. 2. ARB/HCTZ on hold till follow up.  DISCHARGE CONDITION: fair  Diet recommendation: Mod Carb  Filed Weights   06/29/13 1412 06/30/13 0526 07/01/13 0512  Weight: 104.5 kg (230 lb 6.1 oz) 104.1 kg (229 lb 8 oz) 103.7 kg (228 lb 9.9 oz)    INITIAL HISTORY: Stephanie Franco is a 63 y.o. female with Past medical history of coronary artery disease, asthma, hypertension, dyslipidemia, obstructive sleep apnea, atrial fibrillation on PRADAXA. Patient presented with cough and shortness of breath that has been ongoing since last one week. She also reported fever. She was found to have Influenza.  Consultations:  None  Procedures:  None  HOSPITAL COURSE:   Influenza A This was the most likely explanation for her presentation. She was started on Tamiflu. She is afebrile currently. Will continue Tamiflu at home.   Acute Bronchitis  Likely as a result of above. Since she did have wheezing and yellow sputum she could have secondary infection. Hence she was started on Levaquin which will be continued for 5 more days. Nebulizers as needed for wheezing. Steroid taper as well.   Elevated Creatinine  This was noted 2/13 and slightly higher today but not significantly so. She was getting higher than her regular dose of lasix and along with dehydration could have contributed to rise in BUN/Creat. She will be asked to hold her Lasix for 2  days along with her ARB/HCTZ. She will need follow up BMET mid next week with her PCP. It is reassuring that she is making urine. Allow to hydrate orally.   History of Coronary artery disease atrial fibrillation and possible diastolic dysfunction.  Stable. She was euvolemic. Continue PRADAXA. HR is well controlled. Lasix as discussed above.  History of Sleep apnea  CPAP each bedtime   History of Diabetes mellitus  May resume home medications. If creatinine noted to be higher at follow up, Metformin may need to be discontinued.  All of the above discussed with patient and her husband. She remains medically stable. She can be discharged home.   PERTINENT LABS:  The results of significant diagnostics from this hospitalization (including imaging, microbiology, ancillary and laboratory) are listed below for reference.     Labs: Basic Metabolic Panel:  Recent Labs Lab 06/29/13 0405 06/30/13 0410 07/01/13 0551  NA 140 141 143  K 3.5* 3.8 4.0  CL 101 101 101  CO2 26 25 28   GLUCOSE 122* 156* 166*  BUN 17 29* 37*  CREATININE 1.07 1.25* 1.43*  CALCIUM 9.3 9.3 9.6   CBC:  Recent Labs Lab 06/29/13 0405 06/30/13 0410  WBC 7.3 6.8  HGB 10.7* 10.4*  HCT 33.4* 32.4*  MCV 82.3 82.4  PLT 145* 140*   CBG:  Recent Labs Lab 06/29/13 1644 06/29/13 2203 06/30/13 0813 06/30/13 1155 06/30/13 1647  GLUCAP 135* 159* 216* 143* 203*     IMAGING STUDIES Dg Chest 2 View (if Patient Has Fever And/or Copd)  06/29/2013   CLINICAL  DATA:  Short of breath.  Fever.  Influenza.  EXAM: CHEST  2 VIEW  COMPARISON:  DG CHEST 2 VIEW dated 05/05/2013  FINDINGS: Unchanged cardiomegaly. Pulmonary vascular congestion is present. No definite edema. Mediastinal contours are unchanged. There is no focal airspace disease. Monitoring leads project over the chest.  IMPRESSION: Cardiomegaly and pulmonary vascular congestion.   Electronically Signed   By: Dereck Ligas M.D.   On: 06/29/2013 04:28     DISCHARGE EXAMINATION: Filed Vitals:   06/30/13 2149 07/01/13 0512 07/01/13 0847 07/01/13 0900  BP: 139/80 129/61  127/67  Pulse: 87 54  81  Temp: 98.5 F (36.9 C) 98 F (36.7 C)  98 F (36.7 C)  TempSrc: Oral Oral  Oral  Resp: 18 16  18   Height:      Weight:  103.7 kg (228 lb 9.9 oz)    SpO2: 99% 99% 97% 99%   General appearance: alert, cooperative, appears stated age and no distress Resp: few scattered wheezing. No crackles. Improved air entry Cardio: regular rate and rhythm, S1, S2 normal, no murmur, click, rub or gallop GI: soft, non-tender; bowel sounds normal; no masses,  no organomegaly Extremities: extremities normal, atraumatic, no cyanosis or edema  DISPOSITION: Home  Discharge Orders   Future Orders Complete By Expires   Diet Carb Modified  As directed    Discharge instructions  As directed    Comments:     Please see you doctor by mid week. You will need blood work Artist) at that time. Please do not take the Valsartan/HCTZ till you see your doctor. Please resume lasix only on Monday. Please seek attention if breathing gets worse or you stop making urine or have nausea/vomiting.   Increase activity slowly  As directed       ALLERGIES: No Known Allergies  Current Discharge Medication List    START taking these medications   Details  guaiFENesin (MUCINEX) 600 MG 12 hr tablet Take 1 tablet (600 mg total) by mouth 2 (two) times daily. Qty: 30 tablet, Refills: 0    levalbuterol (XOPENEX) 0.63 MG/3ML nebulizer solution Take 3 mLs (0.63 mg total) by nebulization every 6 (six) hours as needed for wheezing or shortness of breath. Qty: 270 mL, Refills: 3    levofloxacin (LEVAQUIN) 500 MG tablet Take 1 tablet (500 mg total) by mouth daily. For 5 more days Qty: 5 tablet, Refills: 0    oseltamivir (TAMIFLU) 75 MG capsule Take 1 capsule (75 mg total) by mouth daily. For 5 more days Qty: 5 capsule, Refills: 0    predniSONE (DELTASONE) 20 MG tablet Take 3 tablets  once daily for 4 days, then take 2 tablets once daily for 4 days, then take 1 tablet once daily for 4 das, then STOP Qty: 24 tablet, Refills: 0      CONTINUE these medications which have CHANGED   Details  furosemide (LASIX) 80 MG tablet Take 0.5-1 tablets (40-80 mg total) by mouth 2 (two) times daily. Please do not take on Saturday and Sunday. May resume from Monday. Qty: 60 tablet, Refills: 11      CONTINUE these medications which have NOT CHANGED   Details  allopurinol (ZYLOPRIM) 300 MG tablet Take 300 mg by mouth daily as needed. Gout    cholecalciferol (VITAMIN D) 1000 UNITS tablet Take 2,000 Units by mouth daily.    colchicine 0.6 MG tablet Take 0.6 mg by mouth daily as needed. gout    dabigatran (PRADAXA) 150 MG CAPS capsule Take  1 capsule (150 mg total) by mouth every 12 (twelve) hours. Qty: 48 capsule, Refills: 0   Associated Diagnoses: Atrial fibrillation, permanent    ezetimibe (ZETIA) 10 MG tablet Take 1 tablet (10 mg total) by mouth daily. Qty: 28 tablet, Refills: 0    ferrous sulfate 325 (65 FE) MG EC tablet Take 325 mg by mouth daily with breakfast.    fluticasone (FLONASE) 50 MCG/ACT nasal spray Place 2 sprays into the nose as needed for rhinitis.    isosorbide mononitrate (IMDUR) 30 MG 24 hr tablet Take 1 tablet (30 mg total) by mouth daily. Qty: 30 tablet, Refills: 11    LORazepam (ATIVAN) 0.5 MG tablet Take 0.5 mg by mouth at bedtime as needed. Anxiety    metFORMIN (GLUMETZA) 1000 MG (MOD) 24 hr tablet Take 1,000 mg by mouth 2 (two) times daily with a meal.     Multiple Vitamins-Minerals (MULTIVITAMIN WITH MINERALS) tablet Take 1 tablet by mouth daily.    nebivolol (BYSTOLIC) 5 MG tablet Take 1 tablet (5 mg total) by mouth daily. Qty: 28 tablet, Refills: 0    nitroGLYCERIN (NITROSTAT) 0.4 MG SL tablet Place 1 tablet (0.4 mg total) under the tongue every 5 (five) minutes as needed. Qty: 25 tablet, Refills: 6    pantoprazole (PROTONIX) 40 MG tablet Take 1  tablet (40 mg total) by mouth daily. Qty: 30 tablet, Refills: 11    potassium chloride SA (K-DUR,KLOR-CON) 20 MEQ tablet Take 1 tablet (20 mEq total) by mouth 2 (two) times daily. Qty: 60 tablet, Refills: 11      STOP taking these medications     albuterol (PROVENTIL HFA;VENTOLIN HFA) 108 (90 BASE) MCG/ACT inhaler      valsartan-hydrochlorothiazide (DIOVAN-HCT) 320-25 MG per tablet        Follow-up Information   Follow up with Salena Saner., MD. Schedule an appointment as soon as possible for a visit in 5 days. (post hospitalization follow up and for blood work.)    Specialty:  Internal Medicine   Contact information:   [address] [place] [address] [phone]       TOTAL DISCHARGE TIME: 23 mins  [facility] Pager [phone]  07/01/2013, 11:20 AM

## 2013-07-01 NOTE — Discharge Instructions (Signed)
Acute Bronchitis Bronchitis is inflammation of the airways that extend from the windpipe into the lungs (bronchi). The inflammation often causes mucus to develop. This leads to a cough, which is the most common symptom of bronchitis.  In acute bronchitis, the condition usually develops suddenly and goes away over time, usually in a couple weeks. Smoking, allergies, and asthma can make bronchitis worse. Repeated episodes of bronchitis may cause further lung problems.  CAUSES Acute bronchitis is most often caused by the same virus that causes a cold. The virus can spread from person to person (contagious).  SIGNS AND SYMPTOMS   Cough.   Fever.   Coughing up mucus.   Body aches.   Chest congestion.   Chills.   Shortness of breath.   Sore throat.  DIAGNOSIS  Acute bronchitis is usually diagnosed through a physical exam. Tests, such as chest X-rays, are sometimes done to rule out other conditions.  TREATMENT  Acute bronchitis usually goes away in a couple weeks. Often times, no medical treatment is necessary. Medicines are sometimes given for relief of fever or cough. Antibiotics are usually not needed but may be prescribed in certain situations. In some cases, an inhaler may be recommended to help reduce shortness of breath and control the cough. A cool mist vaporizer may also be used to help thin bronchial secretions and make it easier to clear the chest.  HOME CARE INSTRUCTIONS  Get plenty of rest.   Drink enough fluids to keep your urine clear or pale yellow (unless you have a medical condition that requires fluid restriction). Increasing fluids may help thin your secretions and will prevent dehydration.   Only take over-the-counter or prescription medicines as directed by your health care provider.   Avoid smoking and secondhand smoke. Exposure to cigarette smoke or irritating chemicals will make bronchitis worse. If you are a smoker, consider using nicotine gum or skin  patches to help control withdrawal symptoms. Quitting smoking will help your lungs heal faster.   Reduce the chances of another bout of acute bronchitis by washing your hands frequently, avoiding people with cold symptoms, and trying not to touch your hands to your mouth, nose, or eyes.   Follow up with your health care provider as directed.  SEEK MEDICAL CARE IF: Your symptoms do not improve after 1 week of treatment.  SEEK IMMEDIATE MEDICAL CARE IF:  You develop an increased fever or chills.   You have chest pain.   You have severe shortness of breath.  You have bloody sputum.   You develop dehydration.  You develop fainting.  You develop repeated vomiting.  You develop a severe headache. MAKE SURE YOU:   Understand these instructions.  Will watch your condition.  Will get help right away if you are not doing well or get worse. Document Released: 06/11/2004 Document Revised: 01/04/2013 Document Reviewed: 10/25/2012 [facility] Patient Information 2014 [place].   Influenza, Adult Influenza ("the flu") is a viral infection of the respiratory tract. It occurs more often in winter months because people spend more time in close contact with one another. Influenza can make you feel very sick. Influenza easily spreads from person to person (contagious). CAUSES  Influenza is caused by a virus that infects the respiratory tract. You can catch the virus by breathing in droplets from an infected person's cough or sneeze. You can also catch the virus by touching something that was recently contaminated with the virus and then touching your mouth, nose, or eyes. SYMPTOMS  Symptoms  typically last 4 to 10 days and may include:  Fever.  Chills.  Headache, body aches, and muscle aches.  Sore throat.  Chest discomfort and cough.  Poor appetite.  Weakness or feeling tired.  Dizziness.  Nausea or vomiting. DIAGNOSIS  Diagnosis of influenza is often made based on  your history and a physical exam. A nose or throat swab test can be done to confirm the diagnosis. RISKS AND COMPLICATIONS You may be at risk for a more severe case of influenza if you smoke cigarettes, have diabetes, have chronic heart disease (such as heart failure) or lung disease (such as asthma), or if you have a weakened immune system. Elderly people and pregnant women are also at risk for more serious infections. The most common complication of influenza is a lung infection (pneumonia). Sometimes, this complication can require emergency medical care and may be life-threatening. PREVENTION  An annual influenza vaccination (flu shot) is the best way to avoid getting influenza. An annual flu shot is now routinely recommended for all adults in the [place] TREATMENT  In mild cases, influenza goes away on its own. Treatment is directed at relieving symptoms. For more severe cases, your caregiver may prescribe antiviral medicines to shorten the sickness. Antibiotic medicines are not effective, because the infection is caused by a virus, not by bacteria. HOME CARE INSTRUCTIONS  Only take over-the-counter or prescription medicines for pain, discomfort, or fever as directed by your caregiver.  Use a cool mist humidifier to make breathing easier.  Get plenty of rest until your temperature returns to normal. This usually takes 3 to 4 days.  Drink enough fluids to keep your urine clear or pale yellow.  Cover your mouth and nose when coughing or sneezing, and wash your hands well to avoid spreading the virus.  Stay home from work or school until your fever has been gone for at least 1 full day. SEEK MEDICAL CARE IF:   You have chest pain or a deep cough that worsens or produces more mucus.  You have nausea, vomiting, or diarrhea. SEEK IMMEDIATE MEDICAL CARE IF:   You have difficulty breathing, shortness of breath, or your skin or nails turn bluish.  You have severe neck pain or stiffness.  You  have a severe headache, facial pain, or earache.  You have a worsening or recurring fever.  You have nausea or vomiting that cannot be controlled. MAKE SURE YOU:  Understand these instructions.  Will watch your condition.  Will get help right away if you are not doing well or get worse. Document Released: 05/01/2000 Document Revised: 11/03/2011 Document Reviewed: 08/03/2011 [facility] Patient Information 2014 [place], [place].

## 2013-07-01 NOTE — Progress Notes (Signed)
Pt left at this time with her spouse at her side. Discharge instructions/prescriptions given/explained with pt verbalizing understanding. Followup appointment noted. Pt without c/o; alert and oriented.

## 2013-07-04 LAB — GLUCOSE, CAPILLARY
GLUCOSE-CAPILLARY: 176 mg/dL — AB (ref 70–99)
GLUCOSE-CAPILLARY: 161 mg/dL — AB (ref 70–99)
GLUCOSE-CAPILLARY: 113 mg/dL — AB (ref 70–99)

## 2013-07-07 NOTE — Telephone Encounter (Signed)
Called patient to clarify the dosage of potassium  She was taking prior to last conversation. Patient states she was previously taking once a day potassium . Informed patient  to follow instruction take twice a day. Prescription was e -sent to pharmacy on 06/30/13.

## 2013-09-06 ENCOUNTER — Other Ambulatory Visit: Payer: Self-pay | Admitting: *Deleted

## 2013-09-06 ENCOUNTER — Telehealth: Payer: Self-pay | Admitting: Cardiology

## 2013-09-06 DIAGNOSIS — I4821 Permanent atrial fibrillation: Secondary | ICD-10-CM

## 2013-09-06 MED ORDER — EZETIMIBE 10 MG PO TABS: 10.0000 mg | ORAL_TABLET | Freq: Every day | ORAL | Status: DC

## 2013-09-06 MED ORDER — NEBIVOLOL HCL 5 MG PO TABS: 5.0000 mg | ORAL_TABLET | Freq: Every day | ORAL | Status: DC

## 2013-09-06 NOTE — Telephone Encounter (Signed)
Pt would like some samples of Pradaxa 150mg ,Bystolic 5mg ,and Zetia 10mg  please.

## 2013-09-06 NOTE — Telephone Encounter (Signed)
Samples of Zetia 10mg  x 4 weeks and Bystolic 5mg  x 4 weeks given.  No Pradaxa samples available.  Patient notified and voiced understanding.  Has a Rx for Pradaxa at the pharmacy.  Will pick up samples at the front desk.

## 2013-09-12 ENCOUNTER — Telehealth: Payer: Self-pay | Admitting: *Deleted

## 2013-09-12 MED ORDER — DABIGATRAN ETEXILATE MESYLATE 75 MG PO CAPS: 150.0000 mg | ORAL_CAPSULE | Freq: Two times a day (BID) | ORAL | Status: DC

## 2013-09-12 NOTE — Telephone Encounter (Signed)
Discount cards for Pradaxa, Zetia and Bystolic left w/ samples for pt.

## 2013-09-12 NOTE — Telephone Encounter (Signed)
Pradaxa 150 mg tabs not available, but 75 mg tabs are.  Pt also has Pharmacist, community and can use copay card.  Returned call and pt verified x 2.  Pt informed message received and samples of 75 mg tabs will be left at front desk along w/ discount copay card.  Pt also informed she should take 2 tabs twice daily.  Pt declined offer to send script.  Stated she has one at home.  Pt advised to take that w/ her to pharmacy after activating the copay card.  Pt verbalized understanding and agreed w/ plan.   Samples at front desk w/ copay cards for Pradaxa, Zetia and Bystolic.

## 2013-09-15 ENCOUNTER — Ambulatory Visit: Payer: 59 | Admitting: Internal Medicine

## 2013-10-03 ENCOUNTER — Telehealth: Payer: Self-pay | Admitting: Cardiology

## 2013-10-03 DIAGNOSIS — I4821 Permanent atrial fibrillation: Secondary | ICD-10-CM

## 2013-10-03 MED ORDER — DABIGATRAN ETEXILATE MESYLATE 150 MG PO CAPS: 150.0000 mg | ORAL_CAPSULE | Freq: Two times a day (BID) | ORAL | Status: DC

## 2013-10-03 NOTE — Telephone Encounter (Signed)
Would like some samples of Pradaxa please.

## 2013-10-03 NOTE — Telephone Encounter (Signed)
Patent notified samples are at front desk

## 2013-10-16 HISTORY — PX: TRANSTHORACIC ECHOCARDIOGRAM: SHX275

## 2013-11-14 ENCOUNTER — Telehealth: Payer: Self-pay | Admitting: Cardiology

## 2013-11-14 NOTE — Telephone Encounter (Signed)
Pt would like some samples of Bystolic 5 mg, Pradaxa [id number] mg and Zetia 10 mg please.

## 2013-11-14 NOTE — Telephone Encounter (Signed)
Spoke with patient and let her know we are out of Bystolic 5mg  but that the Zetia and Pradaxa samples will be up front for her to pick up. Patient voiced understanding

## 2013-11-22 ENCOUNTER — Telehealth: Payer: Self-pay | Admitting: Cardiology

## 2013-11-22 NOTE — Telephone Encounter (Signed)
Pt would like some samples of Bystolic 5mg  please

## 2013-11-23 ENCOUNTER — Telehealth: Payer: Self-pay | Admitting: Cardiology

## 2013-11-23 ENCOUNTER — Other Ambulatory Visit: Payer: Self-pay | Admitting: *Deleted

## 2013-11-23 MED ORDER — NEBIVOLOL HCL 5 MG PO TABS: 5.0000 mg | ORAL_TABLET | Freq: Every day | ORAL | Status: DC

## 2013-11-23 NOTE — Telephone Encounter (Signed)
Samples out front ; pt called and informed

## 2013-11-23 NOTE — Telephone Encounter (Signed)
Please call,says she need to talk to you about her Bystolic.

## 2014-01-08 ENCOUNTER — Telehealth: Payer: Self-pay | Admitting: Cardiology

## 2014-01-08 DIAGNOSIS — I4821 Permanent atrial fibrillation: Secondary | ICD-10-CM

## 2014-01-08 MED ORDER — DABIGATRAN ETEXILATE MESYLATE 150 MG PO CAPS: 150.0000 mg | ORAL_CAPSULE | Freq: Two times a day (BID) | ORAL | Status: DC

## 2014-01-08 MED ORDER — NEBIVOLOL HCL 5 MG PO TABS: 5.0000 mg | ORAL_TABLET | Freq: Every day | ORAL | Status: DC

## 2014-01-08 NOTE — Telephone Encounter (Signed)
Patient notified that samples of pradaxa and bystolic are at front for pick up. Advised that she call later in the week to check on zetia

## 2014-01-08 NOTE — Telephone Encounter (Signed)
Pt would like some samples of Bystolic 5mg  and Pradaxa 150 mg and Zetia 10 mg please.

## 2014-01-09 ENCOUNTER — Other Ambulatory Visit: Payer: Self-pay | Admitting: *Deleted

## 2014-01-09 ENCOUNTER — Telehealth: Payer: Self-pay | Admitting: Cardiology

## 2014-01-09 MED ORDER — ISOSORBIDE MONONITRATE ER 30 MG PO TB24: 30.0000 mg | ORAL_TABLET | Freq: Every day | ORAL | Status: DC

## 2014-01-09 MED ORDER — PANTOPRAZOLE SODIUM 40 MG PO TBEC: 40.0000 mg | DELAYED_RELEASE_TABLET | Freq: Every day | ORAL | Status: DC

## 2014-01-09 NOTE — Telephone Encounter (Signed)
Pt still have not received her Isosorbide and Pantorlzole. Please call to [phone].Please call today if possible.

## 2014-01-10 NOTE — Telephone Encounter (Signed)
Patient notified

## 2014-01-10 NOTE — Telephone Encounter (Signed)
Refilled #30 tablets with 11 refills on 01/09/14

## 2014-01-24 ENCOUNTER — Other Ambulatory Visit: Payer: Self-pay | Admitting: Cardiology

## 2014-01-24 NOTE — Telephone Encounter (Signed)
Rx was sent to pharmacy electronically. 

## 2014-03-05 ENCOUNTER — Other Ambulatory Visit: Payer: Self-pay | Admitting: *Deleted

## 2014-03-05 ENCOUNTER — Telehealth: Payer: Self-pay | Admitting: Cardiology

## 2014-03-05 DIAGNOSIS — I4821 Permanent atrial fibrillation: Secondary | ICD-10-CM

## 2014-03-05 MED ORDER — NEBIVOLOL HCL 5 MG PO TABS: 5.0000 mg | ORAL_TABLET | Freq: Every day | ORAL | Status: DC

## 2014-03-05 MED ORDER — DABIGATRAN ETEXILATE MESYLATE 150 MG PO CAPS: 150.0000 mg | ORAL_CAPSULE | Freq: Two times a day (BID) | ORAL | Status: DC

## 2014-03-05 MED ORDER — EZETIMIBE 10 MG PO TABS: 10.0000 mg | ORAL_TABLET | Freq: Every day | ORAL | Status: DC

## 2014-03-05 NOTE — Telephone Encounter (Signed)
Pt would like some samples of Bystolic 5 mg,Prdaxa [id number] mg and Zetia 10 mg please.

## 2014-03-05 NOTE — Telephone Encounter (Signed)
Samples provided. Patient notified they will be available for pick up

## 2014-03-05 NOTE — Telephone Encounter (Signed)
More bystolic samples came in - patient notified. #28 tablets given

## 2014-04-16 ENCOUNTER — Other Ambulatory Visit: Payer: Self-pay | Admitting: Cardiology

## 2014-04-16 DIAGNOSIS — I4821 Permanent atrial fibrillation: Secondary | ICD-10-CM

## 2014-04-16 MED ORDER — NEBIVOLOL HCL 5 MG PO TABS: 5.0000 mg | ORAL_TABLET | Freq: Every day | ORAL | Status: DC

## 2014-04-16 MED ORDER — DABIGATRAN ETEXILATE MESYLATE 150 MG PO CAPS: 150.0000 mg | ORAL_CAPSULE | Freq: Two times a day (BID) | ORAL | Status: DC

## 2014-04-16 NOTE — Telephone Encounter (Signed)
t would like some samples of Pradaxa , Zetia,and Bystolic please.

## 2014-04-16 NOTE — Telephone Encounter (Signed)
Patient notified samples are available for pick up. She will call back to check on zetia.

## 2014-04-26 ENCOUNTER — Encounter (HOSPITAL_COMMUNITY): Payer: Self-pay | Admitting: Cardiology

## 2014-05-31 ENCOUNTER — Telehealth: Payer: Self-pay | Admitting: Cardiology

## 2014-05-31 DIAGNOSIS — I4821 Permanent atrial fibrillation: Secondary | ICD-10-CM

## 2014-05-31 NOTE — Telephone Encounter (Signed)
Pt would like some samples of Bystolic,Zetia and Pradaxa please.

## 2014-06-01 MED ORDER — NEBIVOLOL HCL 5 MG PO TABS: 5.0000 mg | ORAL_TABLET | Freq: Every day | ORAL | Status: DC

## 2014-06-01 MED ORDER — DABIGATRAN ETEXILATE MESYLATE 150 MG PO CAPS: 150.0000 mg | ORAL_CAPSULE | Freq: Two times a day (BID) | ORAL | Status: DC

## 2014-06-01 NOTE — Telephone Encounter (Signed)
Samples of pradaxa and bystolic provided. Patient is aware she needs appointment - offered to make appointment and she did not have her calender with her.

## 2014-06-04 ENCOUNTER — Other Ambulatory Visit: Payer: Self-pay | Admitting: Cardiology

## 2014-06-04 MED ORDER — EZETIMIBE 10 MG PO TABS: 10.0000 mg | ORAL_TABLET | Freq: Every day | ORAL | Status: DC

## 2014-06-04 NOTE — Telephone Encounter (Signed)
Rx(s) sent to pharmacy electronically.  

## 2014-06-04 NOTE — Telephone Encounter (Signed)
Pt need a new prescription for Zetia please. Please call this to [facility] [phone]

## 2014-06-11 ENCOUNTER — Encounter: Payer: Self-pay | Admitting: Cardiology

## 2014-06-11 ENCOUNTER — Ambulatory Visit (INDEPENDENT_AMBULATORY_CARE_PROVIDER_SITE_OTHER): Payer: Medicare Other | Admitting: Cardiology

## 2014-06-11 VITALS — BP 132/84 | HR 75 | Ht 63.0 in | Wt 240.4 lb

## 2014-06-11 DIAGNOSIS — R0609 Other forms of dyspnea: Secondary | ICD-10-CM

## 2014-06-11 DIAGNOSIS — I10 Essential (primary) hypertension: Secondary | ICD-10-CM

## 2014-06-11 DIAGNOSIS — I25118 Atherosclerotic heart disease of native coronary artery with other forms of angina pectoris: Secondary | ICD-10-CM

## 2014-06-11 DIAGNOSIS — I2510 Atherosclerotic heart disease of native coronary artery without angina pectoris: Secondary | ICD-10-CM

## 2014-06-11 DIAGNOSIS — R600 Localized edema: Secondary | ICD-10-CM

## 2014-06-11 DIAGNOSIS — I482 Chronic atrial fibrillation: Secondary | ICD-10-CM

## 2014-06-11 DIAGNOSIS — E785 Hyperlipidemia, unspecified: Secondary | ICD-10-CM

## 2014-06-11 DIAGNOSIS — Z9861 Coronary angioplasty status: Secondary | ICD-10-CM

## 2014-06-11 DIAGNOSIS — I4821 Permanent atrial fibrillation: Secondary | ICD-10-CM

## 2014-06-11 MED ORDER — ISOSORBIDE MONONITRATE ER 60 MG PO TB24: 60.0000 mg | ORAL_TABLET | Freq: Every day | ORAL | Status: DC

## 2014-06-11 MED ORDER — VALSARTAN-HYDROCHLOROTHIAZIDE 320-25 MG PO TABS: 1.0000 | ORAL_TABLET | Freq: Every day | ORAL | Status: DC

## 2014-06-11 MED ORDER — FUROSEMIDE 80 MG PO TABS: 40.0000 mg | ORAL_TABLET | Freq: Two times a day (BID) | ORAL | Status: DC

## 2014-06-11 MED ORDER — POTASSIUM CHLORIDE CRYS ER 20 MEQ PO TBCR: 20.0000 meq | EXTENDED_RELEASE_TABLET | Freq: Two times a day (BID) | ORAL | Status: DC

## 2014-06-11 MED ORDER — PANTOPRAZOLE SODIUM 40 MG PO TBEC: 40.0000 mg | DELAYED_RELEASE_TABLET | Freq: Every day | ORAL | Status: DC

## 2014-06-11 NOTE — Patient Instructions (Addendum)
Your physician has recommended making the following medication changes: INCREASE Imdur to 60 mg. You may take 2 tablets daily until you run out of your current bottle. THEN, pick up new prescription (60 mg tablets) and follow those instructions listed on the bottle.  Dr Ellyn Hack wants you to follow-up in 6 months. You will receive a reminder letter in the mail one months in advance. If you don't receive a letter, please call our office to schedule the follow-up appointment.  Dr Ellyn Hack recommends that when you feel your heart rate is increased you MAY take an extra Bystolic in lieu of Valsartan.  Medication samples have been provided to the patient. Drug name: Zetia 10  Qty: 35 tabs LOT: [id number]  Exp.Date: 07/2016 Drug name: Pradaxa 150 Qty: 60tabs LOT: [id number]  Exp.Date: 01/2016  [name] 4:45 PM 06/11/2014

## 2014-06-13 ENCOUNTER — Encounter: Payer: Self-pay | Admitting: Cardiology

## 2014-06-13 NOTE — Assessment & Plan Note (Signed)
She is having some exertional dyspnea and some chest tightness that occurs with exertion on occasion. 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 Certainly not enough to evaluate with stress test.  Plan: Increase Imdur to 60 mg daily. Continue beta blocker at current dose along with ARB/HCTZ combination.

## 2014-06-13 NOTE — Assessment & Plan Note (Signed)
Relatively mild symptoms and improved. Probably related to increased weight. No other real active heart failure type symptoms. Continuing current dose of Lasix for volume removal. If she does have symptoms of PND or orthopnea in addition to exertional dyspnea would need to titrate up the Lasix temporarily.

## 2014-06-13 NOTE — Assessment & Plan Note (Signed)
Rate control using beta blocker. Not noticing his frequent rapid heart rate spells. Tolerating Pradaxa well without bleeding complications.

## 2014-06-13 NOTE — Assessment & Plan Note (Signed)
Relatively well-controlled on current medications. We could consider increasing beta blocker dose if her pressures were increased, but for now we'll monitor

## 2014-06-13 NOTE — Assessment & Plan Note (Signed)
Much better on Lasix with support hose when she uses them

## 2014-06-13 NOTE — Progress Notes (Signed)
PCP: No primary care provider on file.  - but she says she is seeing her PCP tomorrow Clinic Note: Chief Complaint  Patient presents with  . Follow-up    1 year, dyspnea with activity.     HPI: Stephanie Franco is a 64 y.o. female with a Cardiovascular Problem List below who presents today for annual followup of CAD and atrial fibrillation. She has a history of PCI to the Circumflex in 2006 with a Taxus DES stent - noted to have 40% ISR back in February 2013. She also has chronic atrial fibrillation that is relatively asymptomatic and rate controlled.  Interval History: She is overall doing relatively well. She does note that she'll get old little bit winded or tired with activity. She denies any resting and exertional chest tightness or pressure -- but if she really pushes it she may note a lid fold and a tightening in her chest.. She notices she is put on a little bit more weight and when that is been a little more dyspneic she had an episode a few weeks ago where she felt somewhat short of breath and tired with fatigue and restarted her iron supplement. Since then she started feeling better. She denies any heartburn or symptoms of PND or orthopnea, and her edema is well controlled on the current dose of Lasix. She occasionally takes an additional dose. Her leg cramping is better than it was - is not on a statin. She really doesn't notice that she is in atrial fibrillation unless she gets going faster. Also she notes that being irregular and it makes her bloated more short of breath Otherwise, he remainder of Cardiovascular ROS are as follows: positive for - dyspnea on exertion, palpitations and Chest tightness with extreme exertion, fatigue negative for - edema, loss of consciousness, murmur, orthopnea, paroxysmal nocturnal dyspnea, rapid heart rate or Lightheadedness, dizziness, near syncope, TIA/amaurosis fugax, claudication. No melena, hematochezia or hematuria.:  Past Medical History    Diagnosis Date  . CAD S/P percutaneous coronary angioplasty 2006;     PCI of circumflex with Taxus DES;; relook-cath Feb 2013: 50-60% short lesion in RCA, 40% ISR Circumflex stent.  . Stented coronary artery     40% ISR in Circumflex stent  . Hypertension   . Hyperlipidemia   . Morbid obesity     BMI 41  . Breast cancer 2008    S/P mastectomy  . Atrial fibrillation, permanent   . Asthma   . 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 OSA on CPAP     On CPAP  . Gout   . Diabetes mellitus, uncontrolled 07/14/2011  . History of echocardiogram 07/22/2011  . Edema of both legs     Usually mild, chronic. Controlled with when necessary furosemide and diet    Prior Cardiac Evaluation and Past Surgical History: Past Surgical History  Procedure Laterality Date  . Coronary angioplasty with stent placement  2006    stent to circumflex  . Cardiac catheterization  07/13/11    normal EF; 95mm 50-60% lesion in RCA; patent stent in circumflex form 2006 with ~40% in-stent re-stenosis  . Mastectomy  2008    left  . Transthoracic echocardiogram  March 2013    LV borderline dilated; borderline concentric LVH; EF 99991111; RV sysolic function mildly reduced; LA severely dilated; RA mod-severely dilated; mild-mod MR; mild TR; mod pulm HTN;   . Left heart catheterization with coronary angiogram N/A 07/13/2011    Procedure: LEFT  HEART CATHETERIZATION WITH CORONARY ANGIOGRAM;  Surgeon: Leonie Man, MD;  Location: [facility] CATH LAB;  Service: Cardiovascular;  Laterality: N/A;    No Known Allergies  Current Outpatient Prescriptions on File Prior to Visit  Medication Sig Dispense Refill  . allopurinol (ZYLOPRIM) 300 MG tablet Take 300 mg by mouth daily as needed. Gout    . cholecalciferol (VITAMIN D) 1000 UNITS tablet Take 2,000 Units by mouth daily.    . colchicine 0.6 MG tablet Take 0.6 mg by mouth daily as needed. gout    . dabigatran (PRADAXA) 150 MG CAPS capsule Take 1 capsule (150 mg total) by  mouth every 12 (twelve) hours. 48 capsule 0  . ezetimibe (ZETIA) 10 MG tablet Take 1 tablet (10 mg total) by mouth daily. 30 tablet 1  . ferrous sulfate 325 (65 FE) MG EC tablet Take 325 mg by mouth daily with breakfast.    . fluticasone (FLONASE) 50 MCG/ACT nasal spray Place 2 sprays into the nose as needed for rhinitis.    Marland Kitchen levalbuterol (XOPENEX) 0.63 MG/3ML nebulizer solution Take 3 mLs (0.63 mg total) by nebulization every 6 (six) hours as needed for wheezing or shortness of breath. 270 mL 3  . LORazepam (ATIVAN) 0.5 MG tablet Take 0.5 mg by mouth at bedtime as needed. Anxiety    . metFORMIN (GLUMETZA) 1000 MG (MOD) 24 hr tablet Take 1,000 mg by mouth 2 (two) times daily with a meal.     . Multiple Vitamins-Minerals (MULTIVITAMIN WITH MINERALS) tablet Take 1 tablet by mouth daily.    . nebivolol (BYSTOLIC) 5 MG tablet Take 1 tablet (5 mg total) by mouth daily. 21 tablet 0  . nitroGLYCERIN (NITROSTAT) 0.4 MG SL tablet Place 1 tablet (0.4 mg total) under the tongue every 5 (five) minutes as needed for chest pain. 25 tablet 4   No current facility-administered medications on file prior to visit.     History   Social History Narrative   She is a married mother of 28, grandmother 2. Usually accompanied by her husband. She does not work. She had been working on her exercise, but is no longer as active. Does not drink and does not smoke   ROS: A comprehensive Review of Systems - was performed Review of Systems  Constitutional: Negative for malaise/fatigue.  Cardiovascular: Negative for claudication.  Gastrointestinal: Negative for blood in stool and melena.  Genitourinary: Negative for hematuria.  Musculoskeletal: Positive for back pain and joint pain.  Endo/Heme/Allergies: Does not bruise/bleed easily.  All other systems reviewed and are negative.   PHYSICAL EXAM BP 132/84 mmHg  Pulse 75  Ht 5\' 3"  (1.6 m)  Wt 240 lb 6.4 oz (109.045 kg)  BMI 42.60 kg/m2 General appearance: alert,  cooperative, appears stated age, no distress and moderately obese Neck: no adenopathy, no carotid bruit and no JVD Lungs: clear to auscultation bilaterally, normal percussion bilaterally and non-labored Heart: Irregularly irregular rhythm with normal rate; S1, S2 shenormal, no murmur, click, rub or gallop ; nondisplaced PMI Abdomen: soft, non-tender; bowel sounds normal; no masses,  no organomegaly; moderate to morbidly obese Extremities: extremities normal, atraumatic, no cyanosis, and edema 1+ bilaterally Pulses: 2+ and symmetric; Skin: normal  Neurologic: Mental status: Alert, oriented, thought content appropriate Cranial nerves: normal (II-XII grossly intact)  [id number] today: Yes Rate: 63 , Rhythm: Atrial fibrillation, nonspecific IVCD with possible LAFB (-69 axis);LVH with mild repolarization changes possible lateral infarct age undetermined --  no significant change;  Recent Labs: none currently available  ASSESSMENT / PLAN: CAD S/P percutaneous coronary angioplasty She is having some exertional dyspnea and some chest tightness that occurs with exertion on occasion. 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 Certainly not enough to evaluate with stress test.  Plan: Increase Imdur to 60 mg daily. Continue beta blocker at current dose along with ARB/HCTZ combination.   DOE (dyspnea on exertion) Relatively mild symptoms and improved. Probably related to increased weight. No other real active heart failure type symptoms. Continuing current dose of Lasix for volume removal. If she does have symptoms of PND or orthopnea in addition to exertional dyspnea would need to titrate up the Lasix temporarily.   Edema of both legs Much better on Lasix with support hose when she uses them   Essential hypertension Relatively well-controlled on current medications. We could consider increasing beta blocker dose if her pressures were increased, but for now we'll  monitor   Hyperlipidemia I don't see that she has Crestor listed currently on a medication. She is on Zetia. Her labs and then followed by her PCP. Her goal LDL is less than 70. I have not seen her in quite some time. She is due to get labs checked this week by her PCP   Atrial fibrillation, permanent Rate control using beta blocker. Not noticing his frequent rapid heart rate spells. Tolerating Pradaxa well without bleeding complications.   Severe obesity (BMI >= 40) She is actually gaining weight. I counseled her strongly on dietary modification, cutting back her intake and increasing exercise.   we also discussed the importance of reducing her overall anxiety level and stress. Her heart rate is faster and the tightness in her chest gets worse when she has anxiety episodes.  No orders of the defined types were placed in this encounter.   Meds ordered this encounter  Medications  . DISCONTD: valsartan-hydrochlorothiazide (DIOVAN-HCT) 320-25 MG per tablet    Sig: Take 1 tablet by mouth daily.  . isosorbide mononitrate (IMDUR) 60 MG 24 hr tablet    Sig: Take 1 tablet (60 mg total) by mouth daily.    Dispense:  30 tablet    Refill:  11  . valsartan-hydrochlorothiazide (DIOVAN-HCT) 320-25 MG per tablet    Sig: Take 1 tablet by mouth daily.    Dispense:  30 tablet    Refill:  11  . potassium chloride SA (K-DUR,KLOR-CON) 20 MEQ tablet    Sig: Take 1 tablet (20 mEq total) by mouth 2 (two) times daily.    Dispense:  60 tablet    Refill:  11  . pantoprazole (PROTONIX) 40 MG tablet    Sig: Take 1 tablet (40 mg total) by mouth daily.    Dispense:  30 tablet    Refill:  11  . furosemide (LASIX) 80 MG tablet    Sig: Take 0.5-1 tablets (40-80 mg total) by mouth 2 (two) times daily. Please do not take on Saturday and Sunday. May resume from Monday.    Dispense:  60 tablet    Refill:  11    Followup: 6 months.  Leonie Man, M.D., M.S. Interventional Cardiologist   Pager #  [phone]

## 2014-06-13 NOTE — Assessment & Plan Note (Signed)
She is actually gaining weight. I counseled her strongly on dietary modification, cutting back her intake and increasing exercise.

## 2014-06-13 NOTE — Assessment & Plan Note (Addendum)
I don't see that she has Crestor listed currently on a medication. She is on Zetia. Her labs and then followed by her PCP. Her goal LDL is less than 70. I have not seen her in quite some time. She is due to get labs checked this week by her PCP

## 2014-08-01 ENCOUNTER — Other Ambulatory Visit: Payer: Self-pay | Admitting: Cardiology

## 2014-08-01 DIAGNOSIS — I4821 Permanent atrial fibrillation: Secondary | ICD-10-CM

## 2014-08-01 MED ORDER — EZETIMIBE 10 MG PO TABS: 10.0000 mg | ORAL_TABLET | Freq: Every day | ORAL | Status: DC

## 2014-08-01 MED ORDER — NEBIVOLOL HCL 5 MG PO TABS: 5.0000 mg | ORAL_TABLET | Freq: Every day | ORAL | Status: DC

## 2014-08-01 MED ORDER — DABIGATRAN ETEXILATE MESYLATE 150 MG PO CAPS: 150.0000 mg | ORAL_CAPSULE | Freq: Two times a day (BID) | ORAL | Status: DC

## 2014-08-01 NOTE — Telephone Encounter (Signed)
Pt called in wanting some samples of Zetia 10mg , Pradaxa 150mg , Bystolic 5mg , Please call  Thanks

## 2014-08-01 NOTE — Telephone Encounter (Signed)
Medication samples have been provided to the patient.  Drug name: Zetia 10   Qty: 28 tabs LOT: [id number] Exp.Date: 07/2016 Drug name: Pradaxa 150  Qty: 60 tabs LOT: [id number]  Exp.Date: 08/2015  Samples left at front desk for patient pick-up. Patient notified.  [place], [place] 5:25 PM 08/01/2014   Refills of Bystolic sent to pharmacy electronically.

## 2014-09-06 ENCOUNTER — Telehealth: Payer: Self-pay | Admitting: Cardiology

## 2014-09-06 NOTE — Telephone Encounter (Signed)
Pt called in wanting some sample of Pradaxa , Zetia , Bystolic . Please call pt  Thanks

## 2014-09-06 NOTE — Telephone Encounter (Signed)
Medication samples have been provided to the patient.  Drug name: zetia 10mg   Qty: 14  LOTCB:[id number]  Exp.Date: 08/2015 Drug name: bystolic 5mg   Qty: 28  LOT: [id number]   Exp. Date: 07/2016 Drug name: pradaxa 150mg   Qty: 36  LOT: [id number]   Exp.Date: 01/2016      Qty: 12  LOTYC:[id number]   Exp.Date: 10/2016  Samples left at front desk for patient pick-up. Patient notified.  Sheral Apley M 3:15 PM 09/06/2014

## 2014-10-16 ENCOUNTER — Telehealth: Payer: Self-pay | Admitting: Cardiology

## 2014-10-16 NOTE — Telephone Encounter (Signed)
Mrs Fetty is calling to get samples of Pradaxa, Zetia 10mg  and Bystolic 5mg  .. Please call   Thanks

## 2014-10-16 NOTE — Telephone Encounter (Signed)
Pt. Called and informed to come and pick up samples

## 2014-10-28 ENCOUNTER — Emergency Department (HOSPITAL_COMMUNITY)
Admission: EM | Admit: 2014-10-28 | Discharge: 2014-10-28 | Disposition: A | Discharge status: Home / Self Care | Payer: Medicare Other | Source: Home / Self Care | Attending: Emergency Medicine | Admitting: Emergency Medicine

## 2014-10-28 ENCOUNTER — Emergency Department (HOSPITAL_COMMUNITY): Payer: Medicare Other

## 2014-10-28 ENCOUNTER — Encounter (HOSPITAL_COMMUNITY): Payer: Self-pay

## 2014-10-28 DIAGNOSIS — Z7951 Long term (current) use of inhaled steroids: Secondary | ICD-10-CM | POA: Insufficient documentation

## 2014-10-28 DIAGNOSIS — Z853 Personal history of malignant neoplasm of breast: Secondary | ICD-10-CM | POA: Insufficient documentation

## 2014-10-28 DIAGNOSIS — J45909 Unspecified asthma, uncomplicated: Secondary | ICD-10-CM | POA: Diagnosis present

## 2014-10-28 DIAGNOSIS — E785 Hyperlipidemia, unspecified: Secondary | ICD-10-CM

## 2014-10-28 DIAGNOSIS — Z79899 Other long term (current) drug therapy: Secondary | ICD-10-CM

## 2014-10-28 DIAGNOSIS — M545 Low back pain: Secondary | ICD-10-CM

## 2014-10-28 DIAGNOSIS — Z6841 Body Mass Index (BMI) 40.0 and over, adult: Secondary | ICD-10-CM

## 2014-10-28 DIAGNOSIS — Z9012 Acquired absence of left breast and nipple: Secondary | ICD-10-CM | POA: Diagnosis present

## 2014-10-28 DIAGNOSIS — E860 Dehydration: Secondary | ICD-10-CM | POA: Diagnosis present

## 2014-10-28 DIAGNOSIS — R109 Unspecified abdominal pain: Secondary | ICD-10-CM | POA: Diagnosis not present

## 2014-10-28 DIAGNOSIS — I482 Chronic atrial fibrillation: Secondary | ICD-10-CM | POA: Diagnosis present

## 2014-10-28 DIAGNOSIS — E119 Type 2 diabetes mellitus without complications: Secondary | ICD-10-CM | POA: Diagnosis present

## 2014-10-28 DIAGNOSIS — R1030 Lower abdominal pain, unspecified: Secondary | ICD-10-CM

## 2014-10-28 DIAGNOSIS — I10 Essential (primary) hypertension: Secondary | ICD-10-CM | POA: Insufficient documentation

## 2014-10-28 DIAGNOSIS — I447 Left bundle-branch block, unspecified: Secondary | ICD-10-CM | POA: Diagnosis present

## 2014-10-28 DIAGNOSIS — K5900 Constipation, unspecified: Secondary | ICD-10-CM

## 2014-10-28 DIAGNOSIS — Z9861 Coronary angioplasty status: Secondary | ICD-10-CM

## 2014-10-28 DIAGNOSIS — M109 Gout, unspecified: Secondary | ICD-10-CM | POA: Diagnosis present

## 2014-10-28 DIAGNOSIS — Z87891 Personal history of nicotine dependence: Secondary | ICD-10-CM

## 2014-10-28 DIAGNOSIS — N12 Tubulo-interstitial nephritis, not specified as acute or chronic: Principal | ICD-10-CM | POA: Diagnosis present

## 2014-10-28 DIAGNOSIS — Z7901 Long term (current) use of anticoagulants: Secondary | ICD-10-CM

## 2014-10-28 DIAGNOSIS — I5022 Chronic systolic (congestive) heart failure: Secondary | ICD-10-CM | POA: Diagnosis present

## 2014-10-28 DIAGNOSIS — I2510 Atherosclerotic heart disease of native coronary artery without angina pectoris: Secondary | ICD-10-CM

## 2014-10-28 DIAGNOSIS — E876 Hypokalemia: Secondary | ICD-10-CM | POA: Diagnosis present

## 2014-10-28 DIAGNOSIS — I129 Hypertensive chronic kidney disease with stage 1 through stage 4 chronic kidney disease, or unspecified chronic kidney disease: Secondary | ICD-10-CM | POA: Diagnosis present

## 2014-10-28 DIAGNOSIS — N179 Acute kidney failure, unspecified: Secondary | ICD-10-CM | POA: Diagnosis present

## 2014-10-28 DIAGNOSIS — I429 Cardiomyopathy, unspecified: Secondary | ICD-10-CM | POA: Diagnosis present

## 2014-10-28 DIAGNOSIS — J45901 Unspecified asthma with (acute) exacerbation: Secondary | ICD-10-CM | POA: Insufficient documentation

## 2014-10-28 DIAGNOSIS — R0602 Shortness of breath: Secondary | ICD-10-CM

## 2014-10-28 DIAGNOSIS — G4733 Obstructive sleep apnea (adult) (pediatric): Secondary | ICD-10-CM

## 2014-10-28 DIAGNOSIS — Z9981 Dependence on supplemental oxygen: Secondary | ICD-10-CM

## 2014-10-28 DIAGNOSIS — Z955 Presence of coronary angioplasty implant and graft: Secondary | ICD-10-CM

## 2014-10-28 DIAGNOSIS — N183 Chronic kidney disease, stage 3 (moderate): Secondary | ICD-10-CM | POA: Diagnosis present

## 2014-10-28 DIAGNOSIS — I459 Conduction disorder, unspecified: Secondary | ICD-10-CM | POA: Diagnosis present

## 2014-10-28 DIAGNOSIS — D649 Anemia, unspecified: Secondary | ICD-10-CM | POA: Diagnosis present

## 2014-10-28 DIAGNOSIS — K219 Gastro-esophageal reflux disease without esophagitis: Secondary | ICD-10-CM | POA: Diagnosis present

## 2014-10-28 LAB — URINALYSIS, ROUTINE W REFLEX MICROSCOPIC
BILIRUBIN URINE: NEGATIVE
Glucose, UA: NEGATIVE mg/dL
Hgb urine dipstick: NEGATIVE
Ketones, ur: NEGATIVE mg/dL
Leukocytes, UA: NEGATIVE
NITRITE: NEGATIVE
Protein, ur: NEGATIVE mg/dL
SPECIFIC GRAVITY, URINE: 1.012 (ref 1.005–1.030)
UROBILINOGEN UA: 1 mg/dL (ref 0.0–1.0)
pH: 5.5 (ref 5.0–8.0)

## 2014-10-28 LAB — BASIC METABOLIC PANEL
Anion gap: 10 (ref 5–15)
BUN: 19 mg/dL (ref 6–20)
CALCIUM: 9.1 mg/dL (ref 8.9–10.3)
CO2: 26 mmol/L (ref 22–32)
CREATININE: 1.14 mg/dL — AB (ref 0.44–1.00)
Chloride: 103 mmol/L (ref 101–111)
GFR calc non Af Amer: 49 mL/min — ABNORMAL LOW (ref >60)
GFR, EST AFRICAN AMERICAN: 57 mL/min — AB (ref >60)
GLUCOSE: 100 mg/dL — AB (ref 65–99)
POTASSIUM: 4.5 mmol/L (ref 3.5–5.1)
SODIUM: 139 mmol/L (ref 135–145)

## 2014-10-28 LAB — CBC
HCT: 37.3 % (ref 36.0–46.0)
Hemoglobin: 11.8 g/dL — ABNORMAL LOW (ref 12.0–15.0)
MCH: 27.7 pg (ref 26.0–34.0)
MCHC: 31.6 g/dL (ref 30.0–36.0)
MCV: 87.6 fL (ref 78.0–100.0)
Platelets: 179 10*3/uL (ref 150–400)
RBC: 4.26 MIL/uL (ref 3.87–5.11)
RDW: 15.8 % — ABNORMAL HIGH (ref 11.5–15.5)
WBC: 3.4 10*3/uL — ABNORMAL LOW (ref 4.0–10.5)

## 2014-10-28 LAB — TROPONIN I

## 2014-10-28 MED ORDER — FUROSEMIDE 10 MG/ML IJ SOLN
40.0000 mg | Freq: Once | INTRAMUSCULAR | Status: DC
  Filled 2014-10-28: qty 4

## 2014-10-28 MED ORDER — ALBUTEROL SULFATE (2.5 MG/3ML) 0.083% IN NEBU
2.5000 mg | INHALATION_SOLUTION | Freq: Once | RESPIRATORY_TRACT | Status: AC
  Administered 2014-10-28: 2.5 mg via RESPIRATORY_TRACT
  Filled 2014-10-28: qty 3

## 2014-10-28 NOTE — Discharge Instructions (Signed)
Abdominal Pain, Women °Abdominal (stomach, pelvic, or belly) pain can be caused by many things. It is important to tell your doctor: °· The location of the pain. °· Does it come and go or is it present all the time? °· Are there things that start the pain (eating certain foods, exercise)? °· Are there other symptoms associated with the pain (fever, nausea, vomiting, diarrhea)? °All of this is helpful to know when trying to find the cause of the pain. °CAUSES  °· Stomach: virus or bacteria infection, or ulcer. °· Intestine: appendicitis (inflamed appendix), regional ileitis (Crohn's disease), ulcerative colitis (inflamed colon), irritable bowel syndrome, diverticulitis (inflamed diverticulum of the colon), or cancer of the stomach or intestine. °· Gallbladder disease or stones in the gallbladder. °· Kidney disease, kidney stones, or infection. °· Pancreas infection or cancer. °· Fibromyalgia (pain disorder). °· Diseases of the female organs: °· Uterus: fibroid (non-cancerous) tumors or infection. °· Fallopian tubes: infection or tubal pregnancy. °· Ovary: cysts or tumors. °· Pelvic adhesions (scar tissue). °· Endometriosis (uterus lining tissue growing in the pelvis and on the pelvic organs). °· Pelvic congestion syndrome (female organs filling up with blood just before the menstrual period). °· Pain with the menstrual period. °· Pain with ovulation (producing an egg). °· Pain with an IUD (intrauterine device, birth control) in the uterus. °· Cancer of the female organs. °· Functional pain (pain not caused by a disease, may improve without treatment). °· Psychological pain. °· Depression. °DIAGNOSIS  °Your doctor will decide the seriousness of your pain by doing an examination. °· Blood tests. °· X-rays. °· Ultrasound. °· CT scan (computed tomography, special type of X-ray). °· MRI (magnetic resonance imaging). °· Cultures, for infection. °· Barium enema (dye inserted in the large intestine, to better view it with  X-rays). °· Colonoscopy (looking in intestine with a lighted tube). °· Laparoscopy (minor surgery, looking in abdomen with a lighted tube). °· Major abdominal exploratory surgery (looking in abdomen with a large incision). °TREATMENT  °The treatment will depend on the cause of the pain.  °· Many cases can be observed and treated at home. °· Over-the-counter medicines recommended by your caregiver. °· Prescription medicine. °· Antibiotics, for infection. °· Birth control pills, for painful periods or for ovulation pain. °· Hormone treatment, for endometriosis. °· Nerve blocking injections. °· Physical therapy. °· Antidepressants. °· Counseling with a psychologist or psychiatrist. °· Minor or major surgery. °HOME CARE INSTRUCTIONS  °· Do not take laxatives, unless directed by your caregiver. °· Take over-the-counter pain medicine only if ordered by your caregiver. Do not take aspirin because it can cause an upset stomach or bleeding. °· Try a clear liquid diet (broth or water) as ordered by your caregiver. Slowly move to a bland diet, as tolerated, if the pain is related to the stomach or intestine. °· Have a thermometer and take your temperature several times a day, and record it. °· Bed rest and sleep, if it helps the pain. °· Avoid sexual intercourse, if it causes pain. °· Avoid stressful situations. °· Keep your follow-up appointments and tests, as your caregiver orders. °· If the pain does not go away with medicine or surgery, you may try: °· Acupuncture. °· Relaxation exercises (yoga, meditation). °· Group therapy. °· Counseling. °SEEK MEDICAL CARE IF:  °· You notice certain foods cause stomach pain. °· Your home care treatment is not helping your pain. °· You need stronger pain medicine. °· You want your IUD removed. °· You feel faint or   lightheaded.  You develop nausea and vomiting.  You develop a rash.  You are having side effects or an allergy to your medicine. SEEK IMMEDIATE MEDICAL CARE IF:   Your  pain does not go away or gets worse.  You have a fever.  Your pain is felt only in portions of the abdomen. The right side could possibly be appendicitis. The left lower portion of the abdomen could be colitis or diverticulitis.  You are passing blood in your stools (bright red or black tarry stools, with or without vomiting).  You have blood in your urine.  You develop chills, with or without a fever.  You pass out. MAKE SURE YOU:   Understand these instructions.  Will watch your condition.  Will get help right away if you are not doing well or get worse. Document Released: 03/01/2007 Document Revised: 09/18/2013 Document Reviewed: 03/21/2009 [facility] Patient Information 2015 [place], [place]. This information is not intended to replace advice given to you by your health care provider. Make sure you discuss any questions you have with your health care provider.  Shortness of Breath Shortness of breath means you have trouble breathing. It could also mean that you have a medical problem. You should get immediate medical care for shortness of breath. CAUSES   Not enough oxygen in the air such as with high altitudes or a smoke-filled room.  Certain lung diseases, infections, or problems.  Heart disease or conditions, such as angina or heart failure.  Low red blood cells (anemia).  Poor physical fitness, which can cause shortness of breath when you exercise.  Chest or back injuries or stiffness.  Being overweight.  Smoking.  Anxiety, which can make you feel like you are not getting enough air. DIAGNOSIS  Serious medical problems can often be found during your physical exam. Tests may also be done to determine why you are having shortness of breath. Tests may include:  Chest X-rays.  Lung function tests.  Blood tests.  An electrocardiogram (ECG).  An ambulatory electrocardiogram. An ambulatory ECG records your heartbeat patterns over a 24-hour period.  Exercise  testing.  A transthoracic echocardiogram (TTE). During echocardiography, sound waves are used to evaluate how blood flows through your heart.  A transesophageal echocardiogram (TEE).  Imaging scans. Your health care provider may not be able to find a cause for your shortness of breath after your exam. In this case, it is important to have a follow-up exam with your health care provider as directed.  TREATMENT  Treatment for shortness of breath depends on the cause of your symptoms and can vary greatly. HOME CARE INSTRUCTIONS   Do not smoke. Smoking is a common cause of shortness of breath. If you smoke, ask for help to quit.  Avoid being around chemicals or things that may bother your breathing, such as paint fumes and dust.  Rest as needed. Slowly resume your usual activities.  If medicines were prescribed, take them as directed for the full length of time directed. This includes oxygen and any inhaled medicines.  Keep all follow-up appointments as directed by your health care provider. SEEK MEDICAL CARE IF:   Your condition does not improve in the time expected.  You have a hard time doing your normal activities even with rest.  You have any new symptoms. SEEK IMMEDIATE MEDICAL CARE IF:   Your shortness of breath gets worse.  You feel light-headed, faint, or develop a cough not controlled with medicines.  You start coughing up blood.  You have pain with breathing.  You have chest pain or pain in your arms, shoulders, or abdomen.  You have a fever.  You are unable to walk up stairs or exercise the way you normally do. MAKE SURE YOU:  Understand these instructions.  Will watch your condition.  Will get help right away if you are not doing well or get worse. Document Released: 01/27/2001 Document Revised: 05/09/2013 Document Reviewed: 07/20/2011 [facility] Patient Information 2015 [place], [place]. This information is not intended to replace advice given to you by your  health care provider. Make sure you discuss any questions you have with your health care provider.

## 2014-10-28 NOTE — ED Notes (Signed)
Patient states she had a sudden onset of SOB, mid and lower back pain, and chills since 1000 today.

## 2014-10-28 NOTE — ED Provider Notes (Signed)
CSN: [id number]     Arrival date & time 10/28/14  1202 History   First MD Initiated Contact with Patient 10/28/14 1326     Chief Complaint  Patient presents with  . Shortness of Breath  . Back Pain     (Consider location/radiation/quality/duration/timing/severity/associated sxs/prior Treatment) Patient is a 64 y.o. female presenting with shortness of breath and abdominal pain.  Shortness of Breath Severity:  Moderate Onset quality:  Gradual Duration: a few hours. Timing:  Constant Progression:  Unchanged Chronicity:  Recurrent Context comment:  Felt short of breath when walking to her car.  Relieved by:  Inhaler Worsened by:  Exertion Ineffective treatments:  None tried Associated symptoms: abdominal pain and wheezing   Associated symptoms: no chest pain, no cough, no fever and no vomiting   Associated symptoms comment:  Chills Abdominal Pain Pain location:  Suprapubic Pain quality: bloating and pressure   Pain radiation: around flanks. Pain severity:  Moderate Onset quality:  Gradual Duration:  12 hours Timing:  Constant Progression:  Waxing and waning Chronicity:  New Relieved by:  Nothing Worsened by:  Nothing tried Ineffective treatments:  None tried Associated symptoms: constipation and shortness of breath   Associated symptoms: no chest pain, no cough, no diarrhea, no dysuria, no fever, no hematuria, no nausea and no vomiting     Past Medical History  Diagnosis Date  . CAD S/P percutaneous coronary angioplasty 2006;     PCI of circumflex with Taxus DES;; relook-cath Feb 2013: 50-60% short lesion in RCA, 40% ISR Circumflex stent.  . Stented coronary artery     40% ISR in Circumflex stent  . Hypertension   . Hyperlipidemia   . Morbid obesity     BMI 41  . Breast cancer 2008    S/P mastectomy  . Atrial fibrillation, permanent   . Asthma   . 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 OSA on CPAP     On CPAP  . Gout   . Diabetes mellitus,  uncontrolled 07/14/2011  . History of echocardiogram 07/22/2011  . Edema of both legs     Usually mild, chronic. Controlled with when necessary furosemide and diet   Past Surgical History  Procedure Laterality Date  . Coronary angioplasty with stent placement  2006    stent to circumflex  . Cardiac catheterization  07/13/11    normal EF; 6mm 50-60% lesion in RCA; patent stent in circumflex form 2006 with ~40% in-stent re-stenosis  . Mastectomy  2008    left  . Transthoracic echocardiogram  March 2013    LV borderline dilated; borderline concentric LVH; EF 99991111; RV sysolic function mildly reduced; LA severely dilated; RA mod-severely dilated; mild-mod MR; mild TR; mod pulm HTN;   . Left heart catheterization with coronary angiogram N/A 07/13/2011    Procedure: LEFT HEART CATHETERIZATION WITH CORONARY ANGIOGRAM;  Surgeon: Leonie Man, MD;  Location: [facility] CATH LAB;  Service: Cardiovascular;  Laterality: N/A;   Family History  Problem Relation Age of Onset  . Coronary artery disease Mother   . Hypertension Mother   . Atrial fibrillation Son    History  Substance Use Topics  . Smoking status: Former Smoker -- 0.50 packs/day for 6 years    Types: Cigarettes    Quit date: 05/18/1992  . Smokeless tobacco: Never Used  . Alcohol Use: No     Comment: 07/13/11 "have drank occasionally; not now"   OB History    No data available  Review of Systems  Constitutional: Negative for fever.  Respiratory: Positive for shortness of breath and wheezing. Negative for cough.   Cardiovascular: Negative for chest pain.  Gastrointestinal: Positive for abdominal pain and constipation. Negative for nausea, vomiting and diarrhea.  Genitourinary: Negative for dysuria and hematuria.  All other systems reviewed and are negative.     Allergies  Review of patient's allergies indicates no known allergies.  Home Medications   Prior to Admission medications   Medication Sig Start Date End Date  Taking? Authorizing Provider  allopurinol (ZYLOPRIM) 300 MG tablet Take 300 mg by mouth daily as needed (for gout flares). Gout   Yes Historical Provider, MD  cholecalciferol (VITAMIN D) 1000 UNITS tablet Take 2,000 Units by mouth daily.   Yes Historical Provider, MD  colchicine 0.6 MG tablet Take 0.6 mg by mouth daily as needed. Take 2 tablets intially, then 1 tablet in 2 hours , Then 1 tablet daily For gout   Yes Historical Provider, MD  dabigatran (PRADAXA) 150 MG CAPS capsule Take 1 capsule (150 mg total) by mouth every 12 (twelve) hours. 08/01/14  Yes Leonie Man, MD  ezetimibe (ZETIA) 10 MG tablet Take 1 tablet (10 mg total) by mouth daily. 08/01/14  Yes Leonie Man, MD  ferrous sulfate 325 (65 FE) MG EC tablet Take 325 mg by mouth daily with breakfast.   Yes Historical Provider, MD  fluticasone (FLONASE) 50 MCG/ACT nasal spray Place 2 sprays into the nose as needed for rhinitis.   Yes Historical Provider, MD  furosemide (LASIX) 80 MG tablet Take 0.5-1 tablets (40-80 mg total) by mouth 2 (two) times daily. Please do not take on Saturday and Sunday. May resume from Monday. Patient taking differently: Take 80 mg by mouth every morning. Please do not take on Saturday and Sunday. May resume from Monday. 06/11/14  Yes Leonie Man, MD  GLUCOPHAGE 850 MG tablet Take 850 mg by mouth 2 (two) times daily with a meal.  10/16/14  Yes Historical Provider, MD  isosorbide mononitrate (IMDUR) 60 MG 24 hr tablet Take 1 tablet (60 mg total) by mouth daily. 06/11/14  Yes Leonie Man, MD  levalbuterol Penne Lash) 0.63 MG/3ML nebulizer solution Take 3 mLs (0.63 mg total) by nebulization every 6 (six) hours as needed for wheezing or shortness of breath. 07/01/13  Yes Bonnielee Haff, MD  LORazepam (ATIVAN) 1 MG tablet Take 1 mg by mouth at bedtime.  10/12/14  Yes Historical Provider, MD  Multiple Vitamins-Minerals (MULTIVITAMIN WITH MINERALS) tablet Take 1 tablet by mouth daily.   Yes Historical Provider, MD   nebivolol (BYSTOLIC) 5 MG tablet Take 1 tablet (5 mg total) by mouth daily. 08/01/14  Yes Leonie Man, MD  nitroGLYCERIN (NITROSTAT) 0.4 MG SL tablet Place 1 tablet (0.4 mg total) under the tongue every 5 (five) minutes as needed for chest pain. 01/24/14  Yes Leonie Man, MD  pantoprazole (PROTONIX) 40 MG tablet Take 1 tablet (40 mg total) by mouth daily. 06/11/14  Yes Leonie Man, MD  potassium chloride SA (K-DUR,KLOR-CON) 20 MEQ tablet Take 1 tablet (20 mEq total) by mouth 2 (two) times daily. 06/11/14  Yes Leonie Man, MD  valsartan-hydrochlorothiazide (DIOVAN-HCT) 320-25 MG per tablet Take 1 tablet by mouth daily. 06/11/14  Yes Leonie Man, MD   BP 139/56 mmHg  Pulse 115  Temp(Src) 99.1 F (37.3 C) (Oral)  Resp 24  SpO2 95% Physical Exam  Constitutional: She is oriented to person, place, and  time. She appears well-developed and well-nourished. No distress.  HENT:  Head: Normocephalic and atraumatic.  Mouth/Throat: Oropharynx is clear and moist.  Eyes: Conjunctivae are normal. Pupils are equal, round, and reactive to light. No scleral icterus.  Neck: Neck supple.  Cardiovascular: Normal rate, regular rhythm, normal heart sounds and intact distal pulses.   No murmur heard. Pulmonary/Chest: Effort normal. No accessory muscle usage or stridor. Tachypnea noted. No respiratory distress. She has wheezes (decreased air movement.). She has no rales.  Abdominal: Soft. Bowel sounds are normal. She exhibits no distension. There is no tenderness. There is no rigidity, no rebound and no guarding.  Musculoskeletal: Normal range of motion.  Neurological: She is alert and oriented to person, place, and time.  Skin: Skin is warm and dry. No rash noted.  Psychiatric: She has a normal mood and affect. Her behavior is normal.  Nursing note and vitals reviewed.   ED Course  Procedures (including critical care time) Labs Review Labs Reviewed  CBC - Abnormal; Notable for the following:     WBC 3.4 (*)    Hemoglobin 11.8 (*)    RDW 15.8 (*)    All other components within normal limits  BASIC METABOLIC PANEL - Abnormal; Notable for the following:    Glucose, Bld 100 (*)    Creatinine, Ser 1.14 (*)    GFR calc non Af Amer 49 (*)    GFR calc Af Amer 57 (*)    All other components within normal limits  URINE CULTURE  TROPONIN I  URINALYSIS, ROUTINE W REFLEX MICROSCOPIC (NOT AT [facility])    Imaging Review Dg Abd Acute W/chest  10/28/2014   CLINICAL DATA:  Sudden onset of shortness of breath. Low back and low abdominal pain today. History of coronary artery disease, hypertension, asthma and diabetes. Initial encounter.  EXAM: DG ABDOMEN ACUTE W/ 1V CHEST  COMPARISON:  Chest radiographs 06/29/2013 and 05/05/2013.  FINDINGS: Stable cardiomegaly and chronic vascular congestion. No confluent airspace opacity, edema or significant pleural effusion. Previous left mastectomy.  The bowel gas pattern is normal. There is no free intraperitoneal air or suspicious abdominal calcification. Small right pelvic calcification is likely a phlebolith. There are mild degenerative changes within the lumbar spine and both hips. Asymmetric ossification of the left iliolumbar ligament noted.  IMPRESSION: No acute cardiopulmonary or abdominal process demonstrated. Stable cardiomegaly and chronic vascular congestion.   Electronically Signed   By: Richardean Sale M.D.   On: 10/28/2014 15:08  All radiology studies independently viewed by me.      EKG Interpretation   Date/Time:  Sunday October 28 2014 12:07:01 EDT Ventricular Rate:  107 PR Interval:    QRS Duration: 120 QT Interval:  370 QTC Calculation: 494 R Axis:   -66 Text Interpretation:  Atrial fibrillation Left anterior fascicular block  LVH with secondary repolarization abnormality Anterior infarct, old No  significant change since last tracing Confirmed by JACUBOWITZ  MD, SAM  [id number]) on 10/28/2014 12:11:21 PM      MDM   Final diagnoses:   Lower abdominal pain  Shortness of breath    64 yo female with shortness of breath and lower abdominal pain.  Abdominal pain felt to her like bloating and constipation.  It resolved prior to my evaluation.  Plain film was reassuring.  Multiple abdominal exams showed soft abdomen without r/r/g.  Could be secondary to constipation.  Advised to return if her pain returned and to follow up with her PCP.    Regarding her  shortness of breath, I suspect mostly asthma, but possibly with some component of CHF.  She did not take her lasix today.  Her O2 sats were in upper 90's.  Her SOB and tachypnea improved with albuterol treatment, although she was tachycardic after treatment.  Since she missed her daily dose of lasix, will give dose of IV.  I do not think she needs hospitalization today based on her discharge pulmonary exam (better air movement, no wheezing, no tachypnea or increased WOB, no hypoxia.)  However gave her strong return precautions and advised close follow up.      Serita Grit, MD 10/28/14 [phone]

## 2014-10-31 ENCOUNTER — Encounter (HOSPITAL_COMMUNITY): Payer: Self-pay | Admitting: *Deleted

## 2014-10-31 ENCOUNTER — Inpatient Hospital Stay (HOSPITAL_COMMUNITY)
Admission: EM | Admit: 2014-10-31 | Discharge: 2014-11-03 | DRG: 690 | Disposition: A | Discharge status: Home / Self Care | Payer: Medicare Other | Attending: Internal Medicine | Admitting: Internal Medicine

## 2014-10-31 ENCOUNTER — Emergency Department (HOSPITAL_COMMUNITY): Payer: Medicare Other

## 2014-10-31 DIAGNOSIS — I5021 Acute systolic (congestive) heart failure: Secondary | ICD-10-CM | POA: Diagnosis not present

## 2014-10-31 DIAGNOSIS — J45909 Unspecified asthma, uncomplicated: Secondary | ICD-10-CM | POA: Diagnosis present

## 2014-10-31 DIAGNOSIS — N183 Chronic kidney disease, stage 3 unspecified: Secondary | ICD-10-CM

## 2014-10-31 DIAGNOSIS — R945 Abnormal results of liver function studies: Secondary | ICD-10-CM | POA: Diagnosis present

## 2014-10-31 DIAGNOSIS — Z853 Personal history of malignant neoplasm of breast: Secondary | ICD-10-CM | POA: Diagnosis not present

## 2014-10-31 DIAGNOSIS — E119 Type 2 diabetes mellitus without complications: Secondary | ICD-10-CM | POA: Diagnosis present

## 2014-10-31 DIAGNOSIS — R109 Unspecified abdominal pain: Secondary | ICD-10-CM

## 2014-10-31 DIAGNOSIS — N179 Acute kidney failure, unspecified: Secondary | ICD-10-CM

## 2014-10-31 DIAGNOSIS — G4733 Obstructive sleep apnea (adult) (pediatric): Secondary | ICD-10-CM | POA: Diagnosis present

## 2014-10-31 DIAGNOSIS — E1169 Type 2 diabetes mellitus with other specified complication: Secondary | ICD-10-CM | POA: Diagnosis present

## 2014-10-31 DIAGNOSIS — I5032 Chronic diastolic (congestive) heart failure: Secondary | ICD-10-CM | POA: Insufficient documentation

## 2014-10-31 DIAGNOSIS — K5900 Constipation, unspecified: Secondary | ICD-10-CM | POA: Diagnosis present

## 2014-10-31 DIAGNOSIS — K219 Gastro-esophageal reflux disease without esophagitis: Secondary | ICD-10-CM | POA: Diagnosis present

## 2014-10-31 DIAGNOSIS — R10A0 Flank pain, unspecified side: Secondary | ICD-10-CM

## 2014-10-31 DIAGNOSIS — I482 Chronic atrial fibrillation: Secondary | ICD-10-CM | POA: Diagnosis not present

## 2014-10-31 DIAGNOSIS — E876 Hypokalemia: Secondary | ICD-10-CM | POA: Diagnosis not present

## 2014-10-31 DIAGNOSIS — D649 Anemia, unspecified: Secondary | ICD-10-CM | POA: Diagnosis present

## 2014-10-31 DIAGNOSIS — Z955 Presence of coronary angioplasty implant and graft: Secondary | ICD-10-CM | POA: Diagnosis not present

## 2014-10-31 DIAGNOSIS — I4821 Permanent atrial fibrillation: Secondary | ICD-10-CM | POA: Diagnosis present

## 2014-10-31 DIAGNOSIS — M109 Gout, unspecified: Secondary | ICD-10-CM | POA: Diagnosis present

## 2014-10-31 DIAGNOSIS — I2510 Atherosclerotic heart disease of native coronary artery without angina pectoris: Secondary | ICD-10-CM | POA: Diagnosis not present

## 2014-10-31 DIAGNOSIS — N12 Tubulo-interstitial nephritis, not specified as acute or chronic: Secondary | ICD-10-CM | POA: Diagnosis not present

## 2014-10-31 DIAGNOSIS — I10 Essential (primary) hypertension: Secondary | ICD-10-CM

## 2014-10-31 DIAGNOSIS — Z87891 Personal history of nicotine dependence: Secondary | ICD-10-CM | POA: Diagnosis not present

## 2014-10-31 DIAGNOSIS — E785 Hyperlipidemia, unspecified: Secondary | ICD-10-CM | POA: Diagnosis present

## 2014-10-31 DIAGNOSIS — I129 Hypertensive chronic kidney disease with stage 1 through stage 4 chronic kidney disease, or unspecified chronic kidney disease: Secondary | ICD-10-CM | POA: Diagnosis present

## 2014-10-31 DIAGNOSIS — G4730 Sleep apnea, unspecified: Secondary | ICD-10-CM

## 2014-10-31 DIAGNOSIS — Z7901 Long term (current) use of anticoagulants: Secondary | ICD-10-CM | POA: Diagnosis not present

## 2014-10-31 DIAGNOSIS — Z6841 Body Mass Index (BMI) 40.0 and over, adult: Secondary | ICD-10-CM | POA: Diagnosis not present

## 2014-10-31 DIAGNOSIS — R600 Localized edema: Secondary | ICD-10-CM | POA: Diagnosis present

## 2014-10-31 DIAGNOSIS — E860 Dehydration: Secondary | ICD-10-CM | POA: Diagnosis present

## 2014-10-31 DIAGNOSIS — Z9012 Acquired absence of left breast and nipple: Secondary | ICD-10-CM | POA: Diagnosis present

## 2014-10-31 DIAGNOSIS — I459 Conduction disorder, unspecified: Secondary | ICD-10-CM | POA: Diagnosis present

## 2014-10-31 DIAGNOSIS — I5042 Chronic combined systolic (congestive) and diastolic (congestive) heart failure: Secondary | ICD-10-CM | POA: Insufficient documentation

## 2014-10-31 DIAGNOSIS — I429 Cardiomyopathy, unspecified: Secondary | ICD-10-CM | POA: Diagnosis present

## 2014-10-31 DIAGNOSIS — I447 Left bundle-branch block, unspecified: Secondary | ICD-10-CM | POA: Diagnosis present

## 2014-10-31 DIAGNOSIS — R7989 Other specified abnormal findings of blood chemistry: Secondary | ICD-10-CM | POA: Diagnosis not present

## 2014-10-31 DIAGNOSIS — Z9861 Coronary angioplasty status: Secondary | ICD-10-CM

## 2014-10-31 DIAGNOSIS — I5022 Chronic systolic (congestive) heart failure: Secondary | ICD-10-CM | POA: Diagnosis not present

## 2014-10-31 HISTORY — DX: Chronic kidney disease, stage 3 (moderate): N18.3

## 2014-10-31 HISTORY — DX: Chronic kidney disease, stage 3 unspecified: N18.30

## 2014-10-31 LAB — CBC WITH DIFFERENTIAL/PLATELET
BASOS PCT: 0 % (ref 0–1)
Basophils Absolute: 0 10*3/uL (ref 0.0–0.1)
EOS ABS: 0 10*3/uL (ref 0.0–0.7)
EOS PCT: 0 % (ref 0–5)
HCT: 32.8 % — ABNORMAL LOW (ref 36.0–46.0)
Hemoglobin: 10.9 g/dL — ABNORMAL LOW (ref 12.0–15.0)
Lymphocytes Relative: 11 % — ABNORMAL LOW (ref 12–46)
Lymphs Abs: 1.1 10*3/uL (ref 0.7–4.0)
MCH: 27.7 pg (ref 26.0–34.0)
MCHC: 33.2 g/dL (ref 30.0–36.0)
MCV: 83.5 fL (ref 78.0–100.0)
Monocytes Absolute: 0.9 10*3/uL (ref 0.1–1.0)
Monocytes Relative: 9 % (ref 3–12)
Neutro Abs: 8.2 10*3/uL — ABNORMAL HIGH (ref 1.7–7.7)
Neutrophils Relative %: 80 % — ABNORMAL HIGH (ref 43–77)
PLATELETS: 160 10*3/uL (ref 150–400)
RBC: 3.93 MIL/uL (ref 3.87–5.11)
RDW: 15.6 % — ABNORMAL HIGH (ref 11.5–15.5)
WBC: 10.2 10*3/uL (ref 4.0–10.5)

## 2014-10-31 LAB — PROCALCITONIN: Procalcitonin: 43.79 ng/mL

## 2014-10-31 LAB — COMPREHENSIVE METABOLIC PANEL
ALT: 65 U/L — ABNORMAL HIGH (ref 14–54)
AST: 45 U/L — ABNORMAL HIGH (ref 15–41)
Albumin: 3.3 g/dL — ABNORMAL LOW (ref 3.5–5.0)
Alkaline Phosphatase: 109 U/L (ref 38–126)
Anion gap: 10 (ref 5–15)
BUN: 37 mg/dL — AB (ref 6–20)
CALCIUM: 8.9 mg/dL (ref 8.9–10.3)
CO2: 29 mmol/L (ref 22–32)
CREATININE: 1.49 mg/dL — AB (ref 0.44–1.00)
Chloride: 99 mmol/L — ABNORMAL LOW (ref 101–111)
GFR, EST AFRICAN AMERICAN: 41 mL/min — AB (ref >60)
GFR, EST NON AFRICAN AMERICAN: 36 mL/min — AB (ref >60)
Glucose, Bld: 125 mg/dL — ABNORMAL HIGH (ref 65–99)
Potassium: 2.5 mmol/L — CL (ref 3.5–5.1)
Sodium: 138 mmol/L (ref 135–145)
Total Bilirubin: 3.5 mg/dL — ABNORMAL HIGH (ref 0.3–1.2)
Total Protein: 7.1 g/dL (ref 6.5–8.1)

## 2014-10-31 LAB — PROTIME-INR
INR: 1.36 (ref 0.00–1.49)
PROTHROMBIN TIME: 16.9 s — AB (ref 11.6–15.2)

## 2014-10-31 LAB — BILIRUBIN, DIRECT: Bilirubin, Direct: 2 mg/dL — ABNORMAL HIGH (ref 0.1–0.5)

## 2014-10-31 LAB — URINE CULTURE: Colony Count: 40000

## 2014-10-31 LAB — LIPASE, BLOOD: LIPASE: 25 U/L (ref 22–51)

## 2014-10-31 LAB — BASIC METABOLIC PANEL
Anion gap: 13 (ref 5–15)
BUN: 38 mg/dL — ABNORMAL HIGH (ref 6–20)
CHLORIDE: 97 mmol/L — AB (ref 101–111)
CO2: 28 mmol/L (ref 22–32)
Calcium: 8.9 mg/dL (ref 8.9–10.3)
Creatinine, Ser: 1.5 mg/dL — ABNORMAL HIGH (ref 0.44–1.00)
GFR calc Af Amer: 41 mL/min — ABNORMAL LOW (ref >60)
GFR calc non Af Amer: 35 mL/min — ABNORMAL LOW (ref >60)
Glucose, Bld: 112 mg/dL — ABNORMAL HIGH (ref 65–99)
POTASSIUM: 2.6 mmol/L — AB (ref 3.5–5.1)
Sodium: 138 mmol/L (ref 135–145)

## 2014-10-31 LAB — RAPID URINE DRUG SCREEN, HOSP PERFORMED
AMPHETAMINES: NOT DETECTED
BARBITURATES: NOT DETECTED
BENZODIAZEPINES: NOT DETECTED
Cocaine: NOT DETECTED
Opiates: POSITIVE — AB
TETRAHYDROCANNABINOL: NOT DETECTED

## 2014-10-31 LAB — TROPONIN I: Troponin I: 0.05 ng/mL — ABNORMAL HIGH (ref <0.031)

## 2014-10-31 LAB — LACTIC ACID, PLASMA: Lactic Acid, Venous: 1.4 mmol/L (ref 0.5–2.0)

## 2014-10-31 LAB — MAGNESIUM: Magnesium: 1.7 mg/dL (ref 1.7–2.4)

## 2014-10-31 LAB — APTT: aPTT: 61 seconds — ABNORMAL HIGH (ref 24–37)

## 2014-10-31 LAB — GLUCOSE, CAPILLARY: GLUCOSE-CAPILLARY: 99 mg/dL (ref 65–99)

## 2014-10-31 MED ORDER — ALLOPURINOL 300 MG PO TABS
300.0000 mg | ORAL_TABLET | Freq: Every day | ORAL | Status: DC | PRN
  Filled 2014-10-31: qty 1

## 2014-10-31 MED ORDER — NEBIVOLOL HCL 5 MG PO TABS
5.0000 mg | ORAL_TABLET | Freq: Every day | ORAL | Status: DC
  Administered 2014-11-01: 5 mg via ORAL
  Filled 2014-10-31 (×2): qty 1

## 2014-10-31 MED ORDER — SODIUM CHLORIDE 0.9 % IV SOLN
INTRAVENOUS | Status: DC
  Administered 2014-10-31 – 2014-11-01 (×2): via INTRAVENOUS

## 2014-10-31 MED ORDER — HYDROCODONE-ACETAMINOPHEN 5-325 MG PO TABS
2.0000 | ORAL_TABLET | Freq: Once | ORAL | Status: AC
  Administered 2014-10-31: 2 via ORAL
  Filled 2014-10-31: qty 2

## 2014-10-31 MED ORDER — DABIGATRAN ETEXILATE MESYLATE 150 MG PO CAPS
150.0000 mg | ORAL_CAPSULE | Freq: Two times a day (BID) | ORAL | Status: DC
  Administered 2014-10-31 – 2014-11-03 (×6): 150 mg via ORAL
  Filled 2014-10-31 (×6): qty 1

## 2014-10-31 MED ORDER — FLUTICASONE PROPIONATE 50 MCG/ACT NA SUSP
2.0000 | Freq: Every day | NASAL | Status: DC
  Administered 2014-11-01 – 2014-11-03 (×3): 2 via NASAL
  Filled 2014-10-31: qty 16

## 2014-10-31 MED ORDER — COLCHICINE 0.6 MG PO TABS
0.6000 mg | ORAL_TABLET | Freq: Every day | ORAL | Status: DC | PRN
  Filled 2014-10-31: qty 1

## 2014-10-31 MED ORDER — ALBUTEROL SULFATE (2.5 MG/3ML) 0.083% IN NEBU
2.5000 mg | INHALATION_SOLUTION | Freq: Four times a day (QID) | RESPIRATORY_TRACT | Status: DC
  Administered 2014-11-01 – 2014-11-02 (×5): 2.5 mg via RESPIRATORY_TRACT
  Filled 2014-10-31 (×5): qty 3

## 2014-10-31 MED ORDER — MORPHINE SULFATE 2 MG/ML IJ SOLN: 2.0000 mg | INTRAMUSCULAR | Status: DC | PRN

## 2014-10-31 MED ORDER — NITROGLYCERIN 0.4 MG SL SUBL: 0.4000 mg | SUBLINGUAL_TABLET | SUBLINGUAL | Status: DC | PRN

## 2014-10-31 MED ORDER — AMLODIPINE BESYLATE 5 MG PO TABS
5.0000 mg | ORAL_TABLET | Freq: Every day | ORAL | Status: DC
  Administered 2014-10-31 – 2014-11-01 (×2): 5 mg via ORAL
  Filled 2014-10-31 (×3): qty 1

## 2014-10-31 MED ORDER — VITAMIN D3 25 MCG (1000 UNIT) PO TABS
2000.0000 [IU] | ORAL_TABLET | Freq: Every day | ORAL | Status: DC
  Administered 2014-11-01 – 2014-11-03 (×3): 2000 [IU] via ORAL
  Filled 2014-10-31 (×3): qty 2

## 2014-10-31 MED ORDER — POTASSIUM CHLORIDE CRYS ER 20 MEQ PO TBCR
40.0000 meq | EXTENDED_RELEASE_TABLET | Freq: Once | ORAL | Status: AC
  Administered 2014-10-31: 40 meq via ORAL
  Filled 2014-10-31: qty 2

## 2014-10-31 MED ORDER — ISOSORBIDE MONONITRATE ER 60 MG PO TB24
60.0000 mg | ORAL_TABLET | Freq: Every day | ORAL | Status: DC
  Administered 2014-11-01 – 2014-11-03 (×3): 60 mg via ORAL
  Filled 2014-10-31 (×3): qty 1

## 2014-10-31 MED ORDER — PANTOPRAZOLE SODIUM 40 MG PO TBEC
40.0000 mg | DELAYED_RELEASE_TABLET | Freq: Every day | ORAL | Status: DC
  Administered 2014-11-01 – 2014-11-03 (×3): 40 mg via ORAL
  Filled 2014-10-31 (×4): qty 1

## 2014-10-31 MED ORDER — ADULT MULTIVITAMIN W/MINERALS CH
1.0000 | ORAL_TABLET | Freq: Every day | ORAL | Status: DC
  Administered 2014-11-01 – 2014-11-03 (×3): 1 via ORAL
  Filled 2014-10-31 (×3): qty 1

## 2014-10-31 MED ORDER — ENSURE ENLIVE PO LIQD
237.0000 mL | Freq: Two times a day (BID) | ORAL | Status: DC
  Administered 2014-11-01 (×2): 237 mL via ORAL

## 2014-10-31 MED ORDER — EZETIMIBE 10 MG PO TABS
10.0000 mg | ORAL_TABLET | Freq: Every day | ORAL | Status: DC
  Administered 2014-11-01 – 2014-11-03 (×3): 10 mg via ORAL
  Filled 2014-10-31 (×3): qty 1

## 2014-10-31 MED ORDER — ONDANSETRON HCL 4 MG PO TABS: 4.0000 mg | ORAL_TABLET | Freq: Four times a day (QID) | ORAL | Status: DC | PRN

## 2014-10-31 MED ORDER — SULFAMETHOXAZOLE-TRIMETHOPRIM 800-160 MG PO TABS
1.0000 | ORAL_TABLET | Freq: Once | ORAL | Status: AC
  Administered 2014-10-31: 1 via ORAL
  Filled 2014-10-31: qty 1

## 2014-10-31 MED ORDER — ALUM & MAG HYDROXIDE-SIMETH 200-200-20 MG/5ML PO SUSP: 30.0000 mL | Freq: Four times a day (QID) | ORAL | Status: DC | PRN

## 2014-10-31 MED ORDER — FERROUS SULFATE 325 (65 FE) MG PO TABS
325.0000 mg | ORAL_TABLET | Freq: Every day | ORAL | Status: DC
  Administered 2014-11-01 – 2014-11-03 (×3): 325 mg via ORAL
  Filled 2014-10-31 (×3): qty 1

## 2014-10-31 MED ORDER — POTASSIUM CHLORIDE 10 MEQ/100ML IV SOLN
10.0000 meq | Freq: Once | INTRAVENOUS | Status: AC
  Administered 2014-10-31: 10 meq via INTRAVENOUS
  Filled 2014-10-31: qty 100

## 2014-10-31 MED ORDER — INSULIN ASPART 100 UNIT/ML ~~LOC~~ SOLN
0.0000 [IU] | Freq: Three times a day (TID) | SUBCUTANEOUS | Status: DC
  Administered 2014-11-01: 2 [IU] via SUBCUTANEOUS
  Administered 2014-11-01: 1 [IU] via SUBCUTANEOUS
  Administered 2014-11-02: 2 [IU] via SUBCUTANEOUS
  Administered 2014-11-02 (×2): 1 [IU] via SUBCUTANEOUS

## 2014-10-31 MED ORDER — HYDROCODONE-ACETAMINOPHEN 5-325 MG PO TABS
2.0000 | ORAL_TABLET | Freq: Four times a day (QID) | ORAL | Status: DC | PRN
  Administered 2014-10-31 – 2014-11-02 (×3): 2 via ORAL
  Filled 2014-10-31 (×3): qty 2

## 2014-10-31 MED ORDER — ONDANSETRON HCL 4 MG/2ML IJ SOLN
4.0000 mg | Freq: Four times a day (QID) | INTRAMUSCULAR | Status: DC | PRN
Start: 2014-10-31 — End: 2014-11-03

## 2014-10-31 MED ORDER — CEFTRIAXONE SODIUM IN DEXTROSE 20 MG/ML IV SOLN
1.0000 g | INTRAVENOUS | Status: DC
  Administered 2014-10-31 – 2014-11-01 (×2): 1 g via INTRAVENOUS
  Filled 2014-10-31 (×3): qty 50

## 2014-10-31 MED ORDER — HYDRALAZINE HCL 20 MG/ML IJ SOLN: 5.0000 mg | INTRAMUSCULAR | Status: DC | PRN

## 2014-10-31 MED ORDER — SODIUM CHLORIDE 0.9 % IJ SOLN
3.0000 mL | Freq: Two times a day (BID) | INTRAMUSCULAR | Status: DC
  Administered 2014-11-02: 3 mL via INTRAVENOUS

## 2014-10-31 MED ORDER — LORAZEPAM 1 MG PO TABS
1.0000 mg | ORAL_TABLET | Freq: Every day | ORAL | Status: DC
  Administered 2014-10-31 – 2014-11-02 (×3): 1 mg via ORAL
  Filled 2014-10-31 (×4): qty 1

## 2014-10-31 NOTE — ED Notes (Signed)
Went to draw the blood the nurse stated to hold off that she was going to start an IV

## 2014-10-31 NOTE — ED Provider Notes (Addendum)
CSN: [id number]     Arrival date & time 10/31/14  1456 History   First MD Initiated Contact with Patient 10/31/14 1539     Chief Complaint  Patient presents with  . Shortness of Breath  . Abdominal Pain  . Urinary Frequency     (Consider location/radiation/quality/duration/timing/severity/associated sxs/prior Treatment) HPI Patient reports right-sided flank pain and urinary frequency onset 3 days ago. She complains of shortness of breath only due to pain. She has denies cough denies nausea denies vomiting she feels as if she's had a kidney infection similar to the ones that she's had in the past she was seen here 10/27/2013. Release. Your urinalysis was negative. Urine culture positive for 40,000 colonies, sensitive to trimethoprim sulfamethoxazole. She denies lightheadedness denies other associated symptoms Past Medical History  Diagnosis Date  . CAD S/P percutaneous coronary angioplasty 2006;     PCI of circumflex with Taxus DES;; relook-cath Feb 2013: 50-60% short lesion in RCA, 40% ISR Circumflex stent.  . Stented coronary artery     40% ISR in Circumflex stent  . Hypertension   . Hyperlipidemia   . Morbid obesity     BMI 41  . Breast cancer 2008    S/P mastectomy  . Atrial fibrillation, permanent   . Asthma   . 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 OSA on CPAP     On CPAP  . Gout   . Diabetes mellitus, uncontrolled 07/14/2011  . History of echocardiogram 07/22/2011  . Edema of both legs     Usually mild, chronic. Controlled with when necessary furosemide and diet   Past Surgical History  Procedure Laterality Date  . Coronary angioplasty with stent placement  2006    stent to circumflex  . Cardiac catheterization  07/13/11    normal EF; 91mm 50-60% lesion in RCA; patent stent in circumflex form 2006 with ~40% in-stent re-stenosis  . Mastectomy  2008    left  . Transthoracic echocardiogram  March 2013    LV borderline dilated; borderline concentric LVH; EF  99991111; RV sysolic function mildly reduced; LA severely dilated; RA mod-severely dilated; mild-mod MR; mild TR; mod pulm HTN;   . Left heart catheterization with coronary angiogram N/A 07/13/2011    Procedure: LEFT HEART CATHETERIZATION WITH CORONARY ANGIOGRAM;  Surgeon: Leonie Man, MD;  Location: [facility] CATH LAB;  Service: Cardiovascular;  Laterality: N/A;   Family History  Problem Relation Age of Onset  . Coronary artery disease Mother   . Hypertension Mother   . Atrial fibrillation Son    History  Substance Use Topics  . Smoking status: Former Smoker -- 0.50 packs/day for 6 years    Types: Cigarettes    Quit date: 05/18/1992  . Smokeless tobacco: Never Used  . Alcohol Use: No     Comment: 07/13/11 "have drank occasionally; not now"   OB History    No data available     Review of Systems  Constitutional: Negative.   HENT: Negative.   Respiratory: Negative.   Cardiovascular: Negative.   Gastrointestinal: Negative.   Genitourinary: Positive for dysuria, frequency and flank pain.  Musculoskeletal: Negative.   Skin: Negative.   Neurological: Negative.   Psychiatric/Behavioral: Negative.   All other systems reviewed and are negative.     Allergies  Review of patient's allergies indicates no known allergies.  Home Medications   Prior to Admission medications   Medication Sig Start Date End Date Taking? Authorizing Provider  allopurinol (ZYLOPRIM) 300  MG tablet Take 300 mg by mouth daily as needed (for gout flares). Gout   Yes Historical Provider, MD  cholecalciferol (VITAMIN D) 1000 UNITS tablet Take 2,000 Units by mouth daily.   Yes Historical Provider, MD  colchicine 0.6 MG tablet Take 0.6 mg by mouth daily as needed. Take 2 tablets intially, then 1 tablet in 2 hours , Then 1 tablet daily For gout   Yes Historical Provider, MD  dabigatran (PRADAXA) 150 MG CAPS capsule Take 1 capsule (150 mg total) by mouth every 12 (twelve) hours. 08/01/14  Yes Leonie Man, MD   ezetimibe (ZETIA) 10 MG tablet Take 1 tablet (10 mg total) by mouth daily. 08/01/14  Yes Leonie Man, MD  ferrous sulfate 325 (65 FE) MG EC tablet Take 325 mg by mouth daily with breakfast.   Yes Historical Provider, MD  fluticasone (FLONASE) 50 MCG/ACT nasal spray Place 2 sprays into the nose as needed for rhinitis.   Yes Historical Provider, MD  furosemide (LASIX) 80 MG tablet Take 0.5-1 tablets (40-80 mg total) by mouth 2 (two) times daily. Please do not take on Saturday and Sunday. May resume from Monday. Patient taking differently: Take 80 mg by mouth every morning. Please do not take on Saturday and Sunday. May resume from Monday. 06/11/14  Yes Leonie Man, MD  isosorbide mononitrate (IMDUR) 60 MG 24 hr tablet Take 1 tablet (60 mg total) by mouth daily. 06/11/14  Yes Leonie Man, MD  levalbuterol Penne Lash) 0.63 MG/3ML nebulizer solution Take 3 mLs (0.63 mg total) by nebulization every 6 (six) hours as needed for wheezing or shortness of breath. 07/01/13  Yes Bonnielee Haff, MD  LORazepam (ATIVAN) 1 MG tablet Take 1 mg by mouth at bedtime.  10/12/14  Yes Historical Provider, MD  Multiple Vitamins-Minerals (MULTIVITAMIN WITH MINERALS) tablet Take 1 tablet by mouth daily.   Yes Historical Provider, MD  nebivolol (BYSTOLIC) 5 MG tablet Take 1 tablet (5 mg total) by mouth daily. 08/01/14  Yes Leonie Man, MD  nitroGLYCERIN (NITROSTAT) 0.4 MG SL tablet Place 1 tablet (0.4 mg total) under the tongue every 5 (five) minutes as needed for chest pain. 01/24/14  Yes Leonie Man, MD  pantoprazole (PROTONIX) 40 MG tablet Take 1 tablet (40 mg total) by mouth daily. 06/11/14  Yes Leonie Man, MD  potassium chloride SA (K-DUR,KLOR-CON) 20 MEQ tablet Take 1 tablet (20 mEq total) by mouth 2 (two) times daily. 06/11/14  Yes Leonie Man, MD  valsartan-hydrochlorothiazide (DIOVAN-HCT) 320-25 MG per tablet Take 1 tablet by mouth daily. 06/11/14  Yes Leonie Man, MD  GLUCOPHAGE 850 MG tablet Take  850 mg by mouth 2 (two) times daily with a meal.  10/16/14   Historical Provider, MD   BP 121/59 mmHg  Pulse 68  Temp(Src) 97.8 F (36.6 C) (Oral)  Resp 19  SpO2 99% Physical Exam  Constitutional: She is oriented to person, place, and time. She appears well-developed and well-nourished. No distress.  HENT:  Head: Normocephalic and atraumatic.  Eyes: Conjunctivae are normal. Pupils are equal, round, and reactive to light.  Neck: Neck supple. No tracheal deviation present. No thyromegaly present.  Cardiovascular: Normal rate.   No murmur heard. Irregularly irregular  Pulmonary/Chest: Effort normal and breath sounds normal.  Abdominal: Soft. Bowel sounds are normal. She exhibits no distension. There is no tenderness.  Obese  Genitourinary:  Right flank tenderness  Musculoskeletal: Normal range of motion. She exhibits no edema or tenderness.  Neurological: She is alert and oriented to person, place, and time. No cranial nerve deficit. Coordination normal.  Skin: Skin is warm and dry. No rash noted.  Psychiatric: She has a normal mood and affect.  Nursing note and vitals reviewed.   ED Course  Procedures (including critical care time) Labs Review Labs Reviewed  CBC WITH DIFFERENTIAL/PLATELET - Abnormal; Notable for the following:    Hemoglobin 10.9 (*)    HCT 32.8 (*)    RDW 15.6 (*)    Neutrophils Relative % 80 (*)    Neutro Abs 8.2 (*)    Lymphocytes Relative 11 (*)    All other components within normal limits  COMPREHENSIVE METABOLIC PANEL  LIPASE, BLOOD  URINALYSIS, ROUTINE W REFLEX MICROSCOPIC (NOT AT [facility])    Imaging Review No results found.   EKG Interpretation   Date/Time:  Wednesday October 31 2014 15:03:43 EDT Ventricular Rate:  95 PR Interval:    QRS Duration: 130 QT Interval:  387 QTC Calculation: 486 R Axis:   -73 Text Interpretation:  Atrial fibrillation Ventricular bigeminy Left bundle  branch block Baseline wander in lead(s) I V3 V4 V5 V6 No  significant  change since last tracing Confirmed by Winfred Leeds  MD, Xana Bradt [id number]) on  10/31/2014 3:08:07 PM      correction EKG shows intraventricular conduction delay new since last tracing 5:45 PM patient feels well after treatment with Norco, Bactrim. Results for orders placed or performed during the hospital encounter of 10/31/14  CBC with Differential  Result Value Ref Range   WBC 10.2 4.0 - 10.5 K/uL   RBC 3.93 3.87 - 5.11 MIL/uL   Hemoglobin 10.9 (L) 12.0 - 15.0 g/dL   HCT 32.8 (L) 36.0 - 46.0 %   MCV 83.5 78.0 - 100.0 fL   MCH 27.7 26.0 - 34.0 pg   MCHC 33.2 30.0 - 36.0 g/dL   RDW 15.6 (H) 11.5 - 15.5 %   Platelets 160 150 - 400 K/uL   Neutrophils Relative % 80 (H) 43 - 77 %   Neutro Abs 8.2 (H) 1.7 - 7.7 K/uL   Lymphocytes Relative 11 (L) 12 - 46 %   Lymphs Abs 1.1 0.7 - 4.0 K/uL   Monocytes Relative 9 3 - 12 %   Monocytes Absolute 0.9 0.1 - 1.0 K/uL   Eosinophils Relative 0 0 - 5 %   Eosinophils Absolute 0.0 0.0 - 0.7 K/uL   Basophils Relative 0 0 - 1 %   Basophils Absolute 0.0 0.0 - 0.1 K/uL  Comprehensive metabolic panel  Result Value Ref Range   Sodium 138 135 - 145 mmol/L   Potassium 2.5 (LL) 3.5 - 5.1 mmol/L   Chloride 99 (L) 101 - 111 mmol/L   CO2 29 22 - 32 mmol/L   Glucose, Bld 125 (H) 65 - 99 mg/dL   BUN 37 (H) 6 - 20 mg/dL   Creatinine, Ser 1.49 (H) 0.44 - 1.00 mg/dL   Calcium 8.9 8.9 - 10.3 mg/dL   Total Protein 7.1 6.5 - 8.1 g/dL   Albumin 3.3 (L) 3.5 - 5.0 g/dL   AST 45 (H) 15 - 41 U/L   ALT 65 (H) 14 - 54 U/L   Alkaline Phosphatase 109 38 - 126 U/L   Total Bilirubin 3.5 (H) 0.3 - 1.2 mg/dL   GFR calc non Af Amer 36 (L) >60 mL/min   GFR calc Af Amer 41 (L) >60 mL/min   Anion gap 10 5 - 15  Lipase, blood  Result Value Ref Range   Lipase 25 22 - 51 U/L  Basic metabolic panel  Result Value Ref Range   Sodium 138 135 - 145 mmol/L   Potassium 2.6 (LL) 3.5 - 5.1 mmol/L   Chloride 97 (L) 101 - 111 mmol/L   CO2 28 22 - 32 mmol/L   Glucose, Bld 112  (H) 65 - 99 mg/dL   BUN 38 (H) 6 - 20 mg/dL   Creatinine, Ser 1.50 (H) 0.44 - 1.00 mg/dL   Calcium 8.9 8.9 - 10.3 mg/dL   GFR calc non Af Amer 35 (L) >60 mL/min   GFR calc Af Amer 41 (L) >60 mL/min   Anion gap 13 5 - 15   Dg Abd Acute W/chest  10/28/2014   CLINICAL DATA:  Sudden onset of shortness of breath. Low back and low abdominal pain today. History of coronary artery disease, hypertension, asthma and diabetes. Initial encounter.  EXAM: DG ABDOMEN ACUTE W/ 1V CHEST  COMPARISON:  Chest radiographs 06/29/2013 and 05/05/2013.  FINDINGS: Stable cardiomegaly and chronic vascular congestion. No confluent airspace opacity, edema or significant pleural effusion. Previous left mastectomy.  The bowel gas pattern is normal. There is no free intraperitoneal air or suspicious abdominal calcification. Small right pelvic calcification is likely a phlebolith. There are mild degenerative changes within the lumbar spine and both hips. Asymmetric ossification of the left iliolumbar ligament noted.  IMPRESSION: No acute cardiopulmonary or abdominal process demonstrated. Stable cardiomegaly and chronic vascular congestion.   Electronically Signed   By: Richardean Sale M.D.   On: 10/28/2014 15:08    MDM  Hypokalemia and renal insufficiency felt to be possibly factitious as patient had normal serum potassium and renal function 3 days ago. Basic metabolic panel re ordered Final diagnoses:  None   hypokalemia, renal insufficiency confirmed. Patient has new EKG findings. Suggest intravenous repletion of potassium.  Spoke with Dr.Niu .   plan telemetry potassium repletion. She can have oralantibiotic to treat antibodies. Urine culture is sensitive to sulfa  Diagnosis #1 hypokalemia #2 pyelonephritis #3 renal insufficiency Orlie Dakin, MD 10/31/14 [place], MD 10/31/14 [place], MD 10/31/14 1944

## 2014-10-31 NOTE — ED Notes (Signed)
Pt reports SOB x4 days. Seen on Sunday in ED for SOB. Pt reports abd pain, thinks she has UTI. Pain 10/10. Denies dysuria. Frequent urination.

## 2014-10-31 NOTE — H&P (Addendum)
[facility] History and Physical  Stephanie Franco [id number] DOB: 1950-06-18 DOA: 10/31/2014  Referring physician: ED physician PCP: Maximino Greenland, MD  Specialists:   Chief Complaint: Increased urinary frequency and right flank pain  HPI: Stephanie Franco is a 64 y.o. female with PMH of hypertension, hyperlipidemia, diabetes mellitus, GERD, gout, coronary artery disease (post status of stent placement 2006), history of breast cancer (post status of mastectomy 2008), atrial fibrillation on Pradaxa, asthma, OSA on CPAP, chronic kidney disease-stage 3, chronic bilateral leg edema on Lasix, who presents with increased urinary frequency and right flank pain.  Patient reports that she has been having increased urinary frequency and right flank pain for 3 days. She had negative urinalysis on 10/28/14, but urine culture showed 40,000 Escherichia coli which are pan-sensitive. She also reports nausea, but no vomiting, abdominal pain or diarrhea. No hematuria. Initially she reported to ED that she has shortness of breath, which has resolved when I saw patient. Currently she does not have chest pain, cough, shortness breath.    In ED, patient was found to have WBC 10.2, negative lipase, no tachycardia, temperature normal, potassium 2.6, mild AoCKD-III, abnormal liver function test with AST 45, ALT 65, total bilirubin 3.5. Patient is admitted to inpatient for further evaluation and treatment.  Where does patient live?   At home    Can patient participate in ADLs?  Yes  Review of Systems:   General: no fevers, chills, no changes in body weight, has poor appetite, has fatigue HEENT: no blurry vision, hearing changes or sore throat Pulm: no dyspnea, coughing, wheezing CV: no chest pain, palpitations Abd: has nausea, no vomiting, abdominal pain, diarrhea, constipation GU: no dysuria, burning on urination, has an increased urinary frequency, no hematuria  Ext: has leg edema Neuro: no  unilateral weakness, numbness, or tingling, no vision change or hearing loss Skin: no rash MSK: No muscle spasm, no deformity, no limitation of range of movement in spin Heme: No easy bruising.  Travel history: No recent long distant travel.  Allergy: No Known Allergies  Past Medical History  Diagnosis Date  . CAD S/P percutaneous coronary angioplasty 2006;     PCI of circumflex with Taxus DES;; relook-cath Feb 2013: 50-60% short lesion in RCA, 40% ISR Circumflex stent.  . Stented coronary artery     40% ISR in Circumflex stent  . Hypertension   . Hyperlipidemia   . Morbid obesity     BMI 41  . Breast cancer 2008    S/P mastectomy  . Atrial fibrillation, permanent   . Asthma   . 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 OSA on CPAP     On CPAP  . Gout   . Diabetes mellitus, uncontrolled 07/14/2011  . History of echocardiogram 07/22/2011  . Edema of both legs     Usually mild, chronic. Controlled with when necessary furosemide and diet  . CKD (chronic kidney disease), stage III     Past Surgical History  Procedure Laterality Date  . Coronary angioplasty with stent placement  2006    stent to circumflex  . Cardiac catheterization  07/13/11    normal EF; 66mm 50-60% lesion in RCA; patent stent in circumflex form 2006 with ~40% in-stent re-stenosis  . Mastectomy  2008    left  . Transthoracic echocardiogram  March 2013    LV borderline dilated; borderline concentric LVH; EF 99991111; RV sysolic function mildly reduced; LA severely dilated; RA mod-severely dilated; mild-mod MR;  mild TR; mod pulm HTN;   . Left heart catheterization with coronary angiogram N/A 07/13/2011    Procedure: LEFT HEART CATHETERIZATION WITH CORONARY ANGIOGRAM;  Surgeon: Leonie Man, MD;  Location: [facility] CATH LAB;  Service: Cardiovascular;  Laterality: N/A;    Social History:  reports that she quit smoking about 22 years ago. Her smoking use included Cigarettes. She has a 3 pack-year smoking history.  She has never used smokeless tobacco. She reports that she does not drink alcohol or use illicit drugs.  Family History:  Family History  Problem Relation Age of Onset  . Coronary artery disease Mother   . Hypertension Mother   . Atrial fibrillation Son      Prior to Admission medications   Medication Sig Start Date End Date Taking? Authorizing Provider  allopurinol (ZYLOPRIM) 300 MG tablet Take 300 mg by mouth daily as needed (for gout flares). Gout   Yes Historical Provider, MD  cholecalciferol (VITAMIN D) 1000 UNITS tablet Take 2,000 Units by mouth daily.   Yes Historical Provider, MD  colchicine 0.6 MG tablet Take 0.6 mg by mouth daily as needed. Take 2 tablets intially, then 1 tablet in 2 hours , Then 1 tablet daily For gout   Yes Historical Provider, MD  dabigatran (PRADAXA) 150 MG CAPS capsule Take 1 capsule (150 mg total) by mouth every 12 (twelve) hours. 08/01/14  Yes Leonie Man, MD  ezetimibe (ZETIA) 10 MG tablet Take 1 tablet (10 mg total) by mouth daily. 08/01/14  Yes Leonie Man, MD  ferrous sulfate 325 (65 FE) MG EC tablet Take 325 mg by mouth daily with breakfast.   Yes Historical Provider, MD  fluticasone (FLONASE) 50 MCG/ACT nasal spray Place 2 sprays into the nose as needed for rhinitis.   Yes Historical Provider, MD  furosemide (LASIX) 80 MG tablet Take 0.5-1 tablets (40-80 mg total) by mouth 2 (two) times daily. Please do not take on Saturday and Sunday. May resume from Monday. Patient taking differently: Take 80 mg by mouth every morning. Please do not take on Saturday and Sunday. May resume from Monday. 06/11/14  Yes Leonie Man, MD  isosorbide mononitrate (IMDUR) 60 MG 24 hr tablet Take 1 tablet (60 mg total) by mouth daily. 06/11/14  Yes Leonie Man, MD  levalbuterol Penne Lash) 0.63 MG/3ML nebulizer solution Take 3 mLs (0.63 mg total) by nebulization every 6 (six) hours as needed for wheezing or shortness of breath. 07/01/13  Yes Bonnielee Haff, MD   LORazepam (ATIVAN) 1 MG tablet Take 1 mg by mouth at bedtime.  10/12/14  Yes Historical Provider, MD  Multiple Vitamins-Minerals (MULTIVITAMIN WITH MINERALS) tablet Take 1 tablet by mouth daily.   Yes Historical Provider, MD  nebivolol (BYSTOLIC) 5 MG tablet Take 1 tablet (5 mg total) by mouth daily. 08/01/14  Yes Leonie Man, MD  nitroGLYCERIN (NITROSTAT) 0.4 MG SL tablet Place 1 tablet (0.4 mg total) under the tongue every 5 (five) minutes as needed for chest pain. 01/24/14  Yes Leonie Man, MD  pantoprazole (PROTONIX) 40 MG tablet Take 1 tablet (40 mg total) by mouth daily. 06/11/14  Yes Leonie Man, MD  potassium chloride SA (K-DUR,KLOR-CON) 20 MEQ tablet Take 1 tablet (20 mEq total) by mouth 2 (two) times daily. 06/11/14  Yes Leonie Man, MD  valsartan-hydrochlorothiazide (DIOVAN-HCT) 320-25 MG per tablet Take 1 tablet by mouth daily. 06/11/14  Yes Leonie Man, MD  GLUCOPHAGE 850 MG tablet Take 850 mg by  mouth 2 (two) times daily with a meal.  10/16/14   Historical Provider, MD    Physical Exam: Filed Vitals:   10/31/14 1504 10/31/14 2011 10/31/14 2015 10/31/14 2046  BP: 121/59 106/67 106/67 120/60  Pulse: 68 79 83 74  Temp: 97.8 F (36.6 C)   98.7 F (37.1 C)  TempSrc: Oral   Oral  Resp: 19 23 25 24   Height:    5\' 3"  (1.6 m)  Weight:    103.465 kg (228 lb 1.6 oz)  SpO2: 99% 96% 93% 100%   General: Not in acute distress HEENT:       Eyes: PERRL, EOMI, no scleral icterus.       ENT: No discharge from the ears and nose, no pharynx injection, no tonsillar enlargement.        Neck: No JVD, no bruit, no mass felt. Heme: No neck lymph node enlargement. Cardiac: S1/S2, RRR, No murmurs, No gallops or rubs. Pulm: No rales, wheezing, rhonchi or rubs. Abd: Soft, nondistended, nontender, no rebound pain, no organomegaly, BS present. Right CVA tenderness Ext: trace pitting leg edema bilaterally. 2+DP/PT pulse bilaterally. Musculoskeletal: No joint deformities, No joint redness  or warmth, no limitation of ROM in spin. Skin: No rashes.  Neuro: Alert, oriented X3, cranial nerves II-XII grossly intact, muscle strength 5/5 in all extremities, sensation to light touch intact.  Psych: Patient is not psychotic, no suicidal or hemocidal ideation.  Labs on Admission:  Basic Metabolic Panel:  Recent Labs Lab 10/28/14 1314 10/31/14 1526 10/31/14 1746 10/31/14 1924  NA 139 138 138  --   K 4.5 2.5* 2.6*  --   CL 103 99* 97*  --   CO2 26 29 28   --   GLUCOSE 100* 125* 112*  --   BUN 19 37* 38*  --   CREATININE 1.14* 1.49* 1.50*  --   CALCIUM 9.1 8.9 8.9  --   MG  --   --   --  1.7   Liver Function Tests:  Recent Labs Lab 10/31/14 1526  AST 45*  ALT 65*  ALKPHOS 109  BILITOT 3.5*  PROT 7.1  ALBUMIN 3.3*    Recent Labs Lab 10/31/14 1526  LIPASE 25   No results for input(s): AMMONIA in the last 168 hours. CBC:  Recent Labs Lab 10/28/14 1314 10/31/14 1526  WBC 3.4* 10.2  NEUTROABS  --  8.2*  HGB 11.8* 10.9*  HCT 37.3 32.8*  MCV 87.6 83.5  PLT 179 160   Cardiac Enzymes:  Recent Labs Lab 10/28/14 1314  TROPONINI <0.03    BNP (last 3 results) No results for input(s): BNP in the last 8760 hours.  ProBNP (last 3 results) No results for input(s): PROBNP in the last 8760 hours.  CBG:  Recent Labs Lab 10/31/14 2159  GLUCAP 99    Radiological Exams on Admission: No results found.  EKG: Independently reviewed.  Abnormal findings: QTC 480, poor R-wave progression, PVC, left bundle blockage which is similar with, but slightly worse than previous EKG on 10/28/14.   Assessment/Plan Principal Problem:   Pyelonephritis Active Problems:   Essential hypertension   Hyperlipidemia   Sleep apnea, on C-Pap   Diabetes mellitus   CAD S/P percutaneous coronary angioplasty   Atrial fibrillation, permanent   Edema of both legs   Severe obesity (BMI >= 40)   Hypokalemia   Acute renal failure superimposed on stage 3 chronic kidney disease    Abnormal liver function  Possible pyelonephritis: Patient has  increased in frequency and right CVA tenderness. Though urinalysis negative, urine culture is positive for 40,000 colony of Escherichia coli. Patient is not septic on admission. Hemodynamically stable.  -Admit to telemetry bed given atrial fibrillation -ED start Bactrim, will switch to IV rocephin given AoCKD-III -follow blood and urine culture -IVF: Normal saline 75 mL per hour -will check trop x 3 given very abnormal EKG and flank pain which can be atypical presentation of ACS.  Acute renal failure superimposed on stage 3 chronic kidney disease: Baseline creatinine is about 1.2-1.3, her creatinine is 1.5 and BUN 38. This is likely due to combination of urinary tract infection and prerenal secondary to dehydration and continuation of ARB and diuretics. -IVF as above -Check FeUrea -US-abdomen to r/o hydronephrosis, this is also for abnormal liver function -Hold lasix and diovan-HCTZ -Follow up renal function by BMP  Abnormal liver function:  AST 45, ALT 65, total bilirubin 3.5. Unclear etiology. No hx of hepatitis -will check hepatitis panel, UDS and HIV ab -check direct bilirubin level -US-abdomen  HTN: --Hold lasix and diovan-HCTZ as above -Start Amlodipine 5 mg daily -continue Nebivelol -IV hydralazine when necessary  HLD: Last LDL was 65 on 07/13/11  -Continue home medications: Zetia  Gout: stable. -Continue allopurinol and colchicine  Sleep apnea -C-Pap  DM-II: Last A1c 9.0 on 07/12/11, poorly well controled. Patient is taking Glucophage at home -SSI -check A1c  Hypokalemia: K= 2.6 on admission. - Repleted - Check Mg level  Atrial Fibrillation: CHA2DS2-VASc Score is 3, needs oral anticoagulation. Patient is on Pradaxa at home. Heart rate is well controlled. No bleeding tendency -continue pradaxa -continue Nebivelol  Chronic Edema of both legs: unclear etiology. Likely due to chronic renal failure. 2-D  echo on 07/22/11 showed EF 50-55%. -hold diuretics due to AoCKD-III  DVT ppx: on Pradaxa and SCD  Code Status: Full code Family Communication:  Yes, patient's husband at bed side Disposition Plan: Admit to inpatient   Date of Service 10/31/2014    Ivor Costa [facility] Pager [phone]  If 7PM-7AM, please contact night-coverage [email or web address] Password [facility] 10/31/2014, 10:12 PM

## 2014-11-01 ENCOUNTER — Ambulatory Visit (HOSPITAL_COMMUNITY): Payer: Medicare Other

## 2014-11-01 ENCOUNTER — Encounter (HOSPITAL_COMMUNITY): Payer: Self-pay | Admitting: Cardiology

## 2014-11-01 ENCOUNTER — Inpatient Hospital Stay (HOSPITAL_COMMUNITY): Payer: Medicare Other

## 2014-11-01 DIAGNOSIS — N12 Tubulo-interstitial nephritis, not specified as acute or chronic: Principal | ICD-10-CM

## 2014-11-01 DIAGNOSIS — I5021 Acute systolic (congestive) heart failure: Secondary | ICD-10-CM

## 2014-11-01 DIAGNOSIS — R7989 Other specified abnormal findings of blood chemistry: Secondary | ICD-10-CM

## 2014-11-01 LAB — COMPREHENSIVE METABOLIC PANEL
ALT: 50 U/L (ref 14–54)
ANION GAP: 11 (ref 5–15)
AST: 33 U/L (ref 15–41)
Albumin: 2.8 g/dL — ABNORMAL LOW (ref 3.5–5.0)
Alkaline Phosphatase: 95 U/L (ref 38–126)
BUN: 37 mg/dL — AB (ref 6–20)
CO2: 27 mmol/L (ref 22–32)
CREATININE: 1.62 mg/dL — AB (ref 0.44–1.00)
Calcium: 8.4 mg/dL — ABNORMAL LOW (ref 8.9–10.3)
Chloride: 101 mmol/L (ref 101–111)
GFR calc Af Amer: 37 mL/min — ABNORMAL LOW (ref >60)
GFR, EST NON AFRICAN AMERICAN: 32 mL/min — AB (ref >60)
GLUCOSE: 106 mg/dL — AB (ref 65–99)
POTASSIUM: 2.9 mmol/L — AB (ref 3.5–5.1)
Sodium: 139 mmol/L (ref 135–145)
Total Bilirubin: 2.4 mg/dL — ABNORMAL HIGH (ref 0.3–1.2)
Total Protein: 6.3 g/dL — ABNORMAL LOW (ref 6.5–8.1)

## 2014-11-01 LAB — GLUCOSE, CAPILLARY
GLUCOSE-CAPILLARY: 85 mg/dL (ref 65–99)
GLUCOSE-CAPILLARY: 141 mg/dL — AB (ref 65–99)
Glucose-Capillary: 191 mg/dL — ABNORMAL HIGH (ref 65–99)
Glucose-Capillary: 156 mg/dL — ABNORMAL HIGH (ref 65–99)

## 2014-11-01 LAB — CBC
HCT: 29.8 % — ABNORMAL LOW (ref 36.0–46.0)
Hemoglobin: 9.9 g/dL — ABNORMAL LOW (ref 12.0–15.0)
MCH: 27.9 pg (ref 26.0–34.0)
MCHC: 33.2 g/dL (ref 30.0–36.0)
MCV: 83.9 fL (ref 78.0–100.0)
Platelets: 137 10*3/uL — ABNORMAL LOW (ref 150–400)
RBC: 3.55 MIL/uL — AB (ref 3.87–5.11)
RDW: 15.7 % — ABNORMAL HIGH (ref 11.5–15.5)
WBC: 9.2 10*3/uL (ref 4.0–10.5)

## 2014-11-01 LAB — MAGNESIUM: MAGNESIUM: 1.9 mg/dL (ref 1.7–2.4)

## 2014-11-01 LAB — BRAIN NATRIURETIC PEPTIDE: B Natriuretic Peptide: 179.8 pg/mL — ABNORMAL HIGH (ref 0.0–100.0)

## 2014-11-01 LAB — LACTIC ACID, PLASMA: LACTIC ACID, VENOUS: 1.5 mmol/L (ref 0.5–2.0)

## 2014-11-01 LAB — CREATININE, URINE, RANDOM: Creatinine, Urine: 130.16 mg/dL

## 2014-11-01 LAB — TROPONIN I
Troponin I: 0.03 ng/mL (ref <0.031)
Troponin I: 0.04 ng/mL — ABNORMAL HIGH (ref <0.031)

## 2014-11-01 LAB — HIV ANTIBODY (ROUTINE TESTING W REFLEX): HIV SCREEN 4TH GENERATION: NONREACTIVE

## 2014-11-01 MED ORDER — MAGNESIUM SULFATE 2 GM/50ML IV SOLN
2.0000 g | Freq: Once | INTRAVENOUS | Status: AC
  Administered 2014-11-01: 2 g via INTRAVENOUS
  Filled 2014-11-01: qty 50

## 2014-11-01 MED ORDER — PERFLUTREN LIPID MICROSPHERE
1.0000 mL | INTRAVENOUS | Status: AC | PRN
  Administered 2014-11-01: 2 mL via INTRAVENOUS
  Filled 2014-11-01: qty 10

## 2014-11-01 MED ORDER — POTASSIUM CHLORIDE 10 MEQ/100ML IV SOLN
10.0000 meq | INTRAVENOUS | Status: AC
  Administered 2014-11-01 (×6): 10 meq via INTRAVENOUS
  Filled 2014-11-01 (×7): qty 100

## 2014-11-01 MED ORDER — POTASSIUM CHLORIDE CRYS ER 20 MEQ PO TBCR
40.0000 meq | EXTENDED_RELEASE_TABLET | ORAL | Status: AC
  Administered 2014-11-01 (×2): 40 meq via ORAL
  Filled 2014-11-01 (×2): qty 2

## 2014-11-01 NOTE — Progress Notes (Signed)
Patient Demographics:    Nourhan Croasmun, is a 64 y.o. female, DOB - 21-Aug-1950, [id number]  Admit date - 10/31/2014   Admitting Physician Ivor Costa, MD  Outpatient Primary MD for the patient is Maximino Greenland, MD  LOS - 1   Chief Complaint  Patient presents with  . Shortness of Breath  . Abdominal Pain  . Urinary Frequency        Subjective:    Kurstin Kotlarz today has, No headache, No chest pain, No abdominal pain - No Nausea, No new weakness tingling or numbness, No Cough - SOB.     Assessment  & Plan :     1. Suprapubic pain. Likely secondary to constipation, patient says she had had no bowel movements for the last 3 days. Pain much improved after BM last night. Currently symptom-free. There is question of some low-grade Escherichia coli infection per last cultures. She's been treated with Rocephin will give at least 3 doses since it has been started. Abdominal ultrasound pending to rule out pyelonephritis. However her pain was suprapubic and I doubt this was the case.   2. Chronic atrial fibrillation [place] VASC score 3 - on Bystolic along with Pradaxa, continue. Goal will be rate controlled.   3. Mildly elevated troponin with left bundle branch block, cannot rule out aberrant conduction with underlying A. fib. Since troponin is mildly elevated will check echogram to evaluate wall motion, cycle troponin trend so far does not look consistent with ACS pattern, request cardiology to evaluate one time. For now continue beta blocker along with Pradaxa. She is pain-free.   4. Elevated total bilirubin with high direct bilirubin. Other liver enzymes are essentially stable, abdominal ultrasound pending, HIV and hepatitis panel pending. She is pain-free in the right upper quadrant. We'll request  pharmacy to evaluate medications. Trend bilirubin.     Code Status : Full  Family Communication  : None present  Disposition Plan  : Home 1-2 days  Consults  :  Cards  Procedures  :  TTE  Abd US  DVT Prophylaxis  :  Pradaxa  Lab Results  Component Value Date   PLT 137* 11/01/2014    Inpatient Medications  Scheduled Meds: . albuterol  2.5 mg Nebulization Q6H  . amLODipine  5 mg Oral Daily  . cefTRIAXone (ROCEPHIN)  IV  1 g Intravenous Q24H  . cholecalciferol  2,000 Units Oral Daily  . dabigatran  150 mg Oral Q12H  . ezetimibe  10 mg Oral Daily  . feeding supplement (ENSURE ENLIVE)  237 mL Oral BID BM  . ferrous sulfate  325 mg Oral Q breakfast  . fluticasone  2 spray Each Nare Daily  . insulin aspart  0-9 Units Subcutaneous TID WC  . isosorbide mononitrate  60 mg Oral Daily  . LORazepam  1 mg Oral QHS  . multivitamin with minerals  1 tablet Oral Daily  . nebivolol  5 mg Oral Daily  . pantoprazole  40 mg Oral Daily  . potassium chloride  10 mEq Intravenous Q1 Hr x 6  . potassium chloride  40 mEq Oral Q4H  . sodium chloride  3 mL Intravenous Q12H   Continuous Infusions: . sodium chloride 75 mL/hr at 11/01/14 0903   PRN  Meds:.allopurinol, alum & mag hydroxide-simeth, colchicine, hydrALAZINE, HYDROcodone-acetaminophen, morphine injection, nitroGLYCERIN, ondansetron **OR** ondansetron (ZOFRAN) IV  Antibiotics  :     Anti-infectives    Start     Dose/Rate Route Frequency Ordered Stop   10/31/14 2200  cefTRIAXone (ROCEPHIN) 1 g in dextrose 5 % 50 mL IVPB - Premix     1 g 100 mL/hr over 30 Minutes Intravenous Every 24 hours 10/31/14 2055     10/31/14 1615  sulfamethoxazole-trimethoprim (BACTRIM DS,SEPTRA DS) 800-160 MG per tablet 1 tablet     1 tablet Oral  Once 10/31/14 1608 10/31/14 1613        Objective:   Filed Vitals:   11/01/14 0159 11/01/14 0429 11/01/14 0745 11/01/14 1038  BP:  95/56  124/70  Pulse:  80    Temp:  97.7 F (36.5 C)    TempSrc:   Oral    Resp:  24    Height:      Weight:      SpO2: 94% 98% 98%     Wt Readings from Last 3 Encounters:  10/31/14 103.465 kg (228 lb 1.6 oz)  06/11/14 109.045 kg (240 lb 6.4 oz)  07/01/13 103.7 kg (228 lb 9.9 oz)     Intake/Output Summary (Last 24 hours) at 11/01/14 1052 Last data filed at 11/01/14 0903  Gross per 24 hour  Intake     50 ml  Output    500 ml  Net   -450 ml     Physical Exam  Awake Alert, Oriented X 3, No new F.N deficits, Normal affect [place].AT,PERRAL Supple Neck,No JVD, No cervical lymphadenopathy appriciated.  Symmetrical Chest wall movement, Good air movement bilaterally, CTAB RRR,No Gallops,Rubs or new Murmurs, No Parasternal Heave +ve B.Sounds, Abd Soft, No tenderness, No organomegaly appriciated, No rebound - guarding or rigidity. No Cyanosis, Clubbing or edema, No new Rash or bruise      Data Review:   Micro Results Recent Results (from the past 240 hour(s))  Urine culture     Status: None   Collection Time: 10/28/14  2:40 PM  Result Value Ref Range Status   Specimen Description URINE, CATHETERIZED  Final   Special Requests NONE  Final   Colony Count   Final    40,000 COLONIES/ML Performed at [facility]    Culture   Final    ESCHERICHIA COLI ENTEROBACTER AEROGENES PROTEUS MIRABILIS Performed at [facility]    Report Status 10/31/2014 FINAL  Final   Organism ID, Bacteria ESCHERICHIA COLI  Final   Organism ID, Bacteria ENTEROBACTER AEROGENES  Final   Organism ID, Bacteria PROTEUS MIRABILIS  Final      Susceptibility   Enterobacter aerogenes - MIC*    CEFAZOLIN RESISTANT      CEFTRIAXONE <=1 SENSITIVE Sensitive     CIPROFLOXACIN <=0.25 SENSITIVE Sensitive     GENTAMICIN <=1 SENSITIVE Sensitive     LEVOFLOXACIN <=0.12 SENSITIVE Sensitive     NITROFURANTOIN 64 INTERMEDIATE Intermediate     TOBRAMYCIN <=1 SENSITIVE Sensitive     TRIMETH/SULFA <=20 SENSITIVE Sensitive     PIP/TAZO <=4 SENSITIVE Sensitive     *  ENTEROBACTER AEROGENES   Escherichia coli - MIC*    AMPICILLIN <=2 SENSITIVE Sensitive     CEFAZOLIN <=4 SENSITIVE Sensitive     CEFTRIAXONE <=1 SENSITIVE Sensitive     CIPROFLOXACIN <=0.25 SENSITIVE Sensitive     GENTAMICIN <=1 SENSITIVE Sensitive     LEVOFLOXACIN <=0.12 SENSITIVE Sensitive  NITROFURANTOIN <=16 SENSITIVE Sensitive     TOBRAMYCIN <=1 SENSITIVE Sensitive     TRIMETH/SULFA <=20 SENSITIVE Sensitive     PIP/TAZO <=4 SENSITIVE Sensitive     * ESCHERICHIA COLI   Proteus mirabilis - MIC*    AMPICILLIN <=2 SENSITIVE Sensitive     CEFAZOLIN <=4 SENSITIVE Sensitive     CEFTRIAXONE <=1 SENSITIVE Sensitive     CIPROFLOXACIN <=0.25 SENSITIVE Sensitive     GENTAMICIN <=1 SENSITIVE Sensitive     LEVOFLOXACIN <=0.12 SENSITIVE Sensitive     NITROFURANTOIN 128 RESISTANT Resistant     TOBRAMYCIN <=1 SENSITIVE Sensitive     TRIMETH/SULFA <=20 SENSITIVE Sensitive     PIP/TAZO <=4 SENSITIVE Sensitive     * PROTEUS MIRABILIS    Radiology Reports Dg Abd Acute W/chest  10/28/2014   CLINICAL DATA:  Sudden onset of shortness of breath. Low back and low abdominal pain today. History of coronary artery disease, hypertension, asthma and diabetes. Initial encounter.  EXAM: DG ABDOMEN ACUTE W/ 1V CHEST  COMPARISON:  Chest radiographs 06/29/2013 and 05/05/2013.  FINDINGS: Stable cardiomegaly and chronic vascular congestion. No confluent airspace opacity, edema or significant pleural effusion. Previous left mastectomy.  The bowel gas pattern is normal. There is no free intraperitoneal air or suspicious abdominal calcification. Small right pelvic calcification is likely a phlebolith. There are mild degenerative changes within the lumbar spine and both hips. Asymmetric ossification of the left iliolumbar ligament noted.  IMPRESSION: No acute cardiopulmonary or abdominal process demonstrated. Stable cardiomegaly and chronic vascular congestion.   Electronically Signed   By: Richardean Sale M.D.   On:  10/28/2014 15:08     CBC  Recent Labs Lab 10/28/14 1314 10/31/14 1526 11/01/14 0253  WBC 3.4* 10.2 9.2  HGB 11.8* 10.9* 9.9*  HCT 37.3 32.8* 29.8*  PLT 179 160 137*  MCV 87.6 83.5 83.9  MCH 27.7 27.7 27.9  MCHC 31.6 33.2 33.2  RDW 15.8* 15.6* 15.7*  LYMPHSABS  --  1.1  --   MONOABS  --  0.9  --   EOSABS  --  0.0  --   BASOSABS  --  0.0  --     Chemistries   Recent Labs Lab 10/28/14 1314 10/31/14 1526 10/31/14 1746 10/31/14 1924 11/01/14 0253 11/01/14 0900  NA 139 138 138  --  139  --   K 4.5 2.5* 2.6*  --  2.9*  --   CL 103 99* 97*  --  101  --   CO2 26 29 28   --  27  --   GLUCOSE 100* 125* 112*  --  106*  --   BUN 19 37* 38*  --  37*  --   CREATININE 1.14* 1.49* 1.50*  --  1.62*  --   CALCIUM 9.1 8.9 8.9  --  8.4*  --   MG  --   --   --  1.7  --  1.9  AST  --  45*  --   --  33  --   ALT  --  65*  --   --  50  --   ALKPHOS  --  109  --   --  95  --   BILITOT  --  3.5*  --   --  2.4*  --    ------------------------------------------------------------------------------------------------------------------ estimated creatinine clearance is 39.8 mL/min (by C-G formula based on Cr of 1.62). ------------------------------------------------------------------------------------------------------------------ No results for input(s): HGBA1C in the last 72 hours. ------------------------------------------------------------------------------------------------------------------ No results for  input(s): CHOL, HDL, LDLCALC, TRIG, CHOLHDL, LDLDIRECT in the last 72 hours. ------------------------------------------------------------------------------------------------------------------ No results for input(s): TSH, T4TOTAL, T3FREE, THYROIDAB in the last 72 hours.  Invalid input(s): FREET3 ------------------------------------------------------------------------------------------------------------------ No results for input(s): VITAMINB12, FOLATE, FERRITIN, TIBC, IRON, RETICCTPCT  in the last 72 hours.  Coagulation profile  Recent Labs Lab 10/31/14 2134  INR 1.36    No results for input(s): DDIMER in the last 72 hours.  Cardiac Enzymes  Recent Labs Lab 10/31/14 2134 11/01/14 0253 11/01/14 0900  TROPONINI 0.05* 0.03 0.04*   ------------------------------------------------------------------------------------------------------------------ Invalid input(s): POCBNP   Time Spent in minutes   35   Lala Lund K M.D on 11/01/2014 at 10:52 AM  Between 7am to 7pm - Pager - [phone]  After 7pm go to [email or web address] - password [facility]  [facility]   Office  [phone]

## 2014-11-01 NOTE — Progress Notes (Signed)
Initial Nutrition Assessment  DOCUMENTATION CODES:  Morbid obesity  INTERVENTION: - Continue Heart Healthy diet - RD will continue to monitor for needs  NUTRITION DIAGNOSIS:  Inadequate oral intake related to acute illness, nausea as evidenced by per patient/family report, meal completion < 50%.  GOAL:  Patient will meet greater than or equal to 90% of their needs  MONITOR:  PO intake, Weight trends, Labs, I & O's  REASON FOR ASSESSMENT:  Malnutrition Screening Tool  ASSESSMENT: Per H&P, 64 y.o. female with PMH of hypertension, hyperlipidemia, diabetes mellitus, GERD, gout, coronary artery disease (post status of stent placement 2006), history of breast cancer (post status of mastectomy 2008), atrial fibrillation on Pradaxa, asthma, OSA on CPAP, chronic kidney disease-stage 3, chronic bilateral leg edema on Lasix, who presents with increased urinary frequency and right flank pain.  Pt seen for MST. BMI indicates morbid obesity. Pt has not eaten a meal since admission and was getting ready to order breakfast at time of RD visit; she had recently returned from ultrasound. Pt states that since 6/12 she has had a very poor appetite and last night was the first full meal she has had since that time. Last night she had a [place] sandwich and PTA she was mainly eating applesauce and jello. She states she was having nausea PTA which is now resolving but she did not have any emesis PTA or now.   Pt feels she was gaining weight over the past few months. Chart review indicates 12 lb weight loss (5% body weight) in the past 6 months which is not significant and may be related to fluid as pt is on Lasix. She does not drink nutrition supplements at home although she sometimes drinks Gatorade for the K.   Expect pt to begin to meet needs with resolving nausea. Medications reviewed. Physical assessment did not show muscle or fat wasting. Labs reviewed; K: 2.9 mmol/L, BUN/creatinine elevated, Ca: 8.4  mg/dL, GFR: 37.  Height:  Ht Readings from Last 1 Encounters:  10/31/14 5\' 3"  (1.6 m)    Weight:  Wt Readings from Last 1 Encounters:  10/31/14 228 lb 1.6 oz (103.465 kg)    Ideal Body Weight:  52.3 kg (kg)  Wt Readings from Last 10 Encounters:  10/31/14 228 lb 1.6 oz (103.465 kg)  06/11/14 240 lb 6.4 oz (109.045 kg)  07/01/13 228 lb 9.9 oz (103.7 kg)  06/22/13 232 lb 4.8 oz (105.371 kg)  12/19/12 233 lb 11.2 oz (106.006 kg)  09/15/11 235 lb (106.595 kg)  07/14/11 247 lb 9.2 oz (112.3 kg)    BMI:  Body mass index is 40.42 kg/(m^2).  Estimated Nutritional Needs:  Kcal:  1600-1800  Protein:  75-85 grams  Fluid:  2-2.2 L/day  Skin:  Wound (see comment) (L breast surgical wound from PTA)  Diet Order:  Diet heart healthy/carb modified Room service appropriate?: Yes; Fluid consistency:: Thin  EDUCATION NEEDS:  No education needs identified at this time   Intake/Output Summary (Last 24 hours) at 11/01/14 1020 Last data filed at 11/01/14 0903  Gross per 24 hour  Intake     50 ml  Output    500 ml  Net   -450 ml    Last BM:  6/12    Jarome Matin, RD, LDN Inpatient Clinical Dietitian Pager # [phone] After hours/weekend pager # [phone]

## 2014-11-01 NOTE — Progress Notes (Signed)
PT Cancellation Note  Patient Details Name: Stephanie Franco MRN: [id number] DOB: 03-09-1951   Cancelled Treatment:    Reason Eval/Treat Not Completed: PT screened, no needs identified, will sign off Pt denies any PT needs at this time.  PT to sign off.   Merl Bommarito,KATHrine E 11/01/2014, 1:58 PM Carmelia Bake, PT, DPT 11/01/2014 Pager: [phone]

## 2014-11-01 NOTE — Care Management Note (Signed)
Case Management Note  Patient Details  Name: Triana Nesheiwat MRN: [id number] Date of Birth: 02/01/1951  Subjective/Objective:   64 y/o f admitted w/Pyelonephritis.From home.                 Action/Plan:d/c plan home.No anticipated d/c needs.   Expected Discharge Date:   (unknown)               Expected Discharge Plan:  Home/Self Care  In-House Referral:     Discharge planning Services  CM Consult  Post Acute Care Choice:    Choice offered to:     DME Arranged:    DME Agency:     HH Arranged:    HH Agency:     Status of Service:  In process, will continue to follow  Medicare Important Message Given:    Date Medicare IM Given:    Medicare IM give by:    Date Additional Medicare IM Given:    Additional Medicare Important Message give by:     If discussed at [place] of Stay Meetings, dates discussed:    Additional Comments:  Dessa Phi, RN 11/01/2014, 1:47 PM

## 2014-11-01 NOTE — Progress Notes (Signed)
Echocardiogram 2D Echocardiogram with Definity has been performed.  Stephanie Franco 11/01/2014, 5:08 PM

## 2014-11-01 NOTE — Progress Notes (Signed)
Pharmacy - Drug information request  Assessment: 64 y.o. female admitted 6/15 with urinary symptoms; noted to have T-bili 3.5.  Pharmacist is asked to review medications for potential causes of hyperbilirubinemia   Medications Prior to Admission  Medication Sig Dispense Refill  . allopurinol (ZYLOPRIM) 300 MG tablet Take 300 mg by mouth daily as needed (for gout flares). Gout    . cholecalciferol (VITAMIN D) 1000 UNITS tablet Take 2,000 Units by mouth daily.    . colchicine 0.6 MG tablet Take 0.6 mg by mouth daily as needed. Take 2 tablets intially, then 1 tablet in 2 hours , Then 1 tablet daily For gout    . dabigatran (PRADAXA) 150 MG CAPS capsule Take 1 capsule (150 mg total) by mouth every 12 (twelve) hours. 60 capsule 0  . ezetimibe (ZETIA) 10 MG tablet Take 1 tablet (10 mg total) by mouth daily. 28 tablet 0  . ferrous sulfate 325 (65 FE) MG EC tablet Take 325 mg by mouth daily with breakfast.    . fluticasone (FLONASE) 50 MCG/ACT nasal spray Place 2 sprays into the nose as needed for rhinitis.    . furosemide (LASIX) 80 MG tablet Take 0.5-1 tablets (40-80 mg total) by mouth 2 (two) times daily. Please do not take on Saturday and Sunday. May resume from Monday. (Patient taking differently: Take 80 mg by mouth every morning. Please do not take on Saturday and Sunday. May resume from Monday.) 60 tablet 11  . isosorbide mononitrate (IMDUR) 60 MG 24 hr tablet Take 1 tablet (60 mg total) by mouth daily. 30 tablet 11  . levalbuterol (XOPENEX) 0.63 MG/3ML nebulizer solution Take 3 mLs (0.63 mg total) by nebulization every 6 (six) hours as needed for wheezing or shortness of breath. 270 mL 3  . LORazepam (ATIVAN) 1 MG tablet Take 1 mg by mouth at bedtime.     . Multiple Vitamins-Minerals (MULTIVITAMIN WITH MINERALS) tablet Take 1 tablet by mouth daily.    . nebivolol (BYSTOLIC) 5 MG tablet Take 1 tablet (5 mg total) by mouth daily. 30 tablet 1  . nitroGLYCERIN (NITROSTAT) 0.4 MG SL tablet Place  1 tablet (0.4 mg total) under the tongue every 5 (five) minutes as needed for chest pain. 25 tablet 4  . pantoprazole (PROTONIX) 40 MG tablet Take 1 tablet (40 mg total) by mouth daily. 30 tablet 11  . potassium chloride SA (K-DUR,KLOR-CON) 20 MEQ tablet Take 1 tablet (20 mEq total) by mouth 2 (two) times daily. 60 tablet 11  . valsartan-hydrochlorothiazide (DIOVAN-HCT) 320-25 MG per tablet Take 1 tablet by mouth daily. 30 tablet 11  . GLUCOPHAGE 850 MG tablet Take 850 mg by mouth 2 (two) times daily with a meal.      Scheduled medications this admission: . albuterol  2.5 mg Nebulization Q6H  . amLODipine  5 mg Oral Daily  . cefTRIAXone (ROCEPHIN)  IV  1 g Intravenous Q24H  . cholecalciferol  2,000 Units Oral Daily  . dabigatran  150 mg Oral Q12H  . ezetimibe  10 mg Oral Daily  . feeding supplement (ENSURE ENLIVE)  237 mL Oral BID BM  . ferrous sulfate  325 mg Oral Q breakfast  . fluticasone  2 spray Each Nare Daily  . insulin aspart  0-9 Units Subcutaneous TID WC  . isosorbide mononitrate  60 mg Oral Daily  . LORazepam  1 mg Oral QHS  . multivitamin with minerals  1 tablet Oral Daily  . nebivolol  5 mg Oral Daily  .  pantoprazole  40 mg Oral Daily  . sodium chloride  3 mL Intravenous Q12H     Transaminases minimally elevated on admission, now wnl  Albumin low on admission (3.3), decreasing  INR/aPTT possibly confounded by Pradaxa   The patient is not taking any drugs routinely associated with intrahepatic cholestasis; however, the following (rare associations) are noted from drug labeling:  Ezetimibe has been associated with rare cholestasis and transaminitis  Ativan and nebivolol have been associated with rare hyperbilirubinemia, transaminitis, and/or jaundice  HCTZ, valsartan, colchicine, and allopurinol have received rare reports of hepatotoxicity or jaundice  Additionally, ceftriaxone has been associated with biliary sludging with resulting hyperbilirubinemia, generally  in neonates, but rarely in adults.  Plan If hyperbilirubinemia does not continue to resolve on its own, suggest the following:  Holding any non-critical home meds (Allopurinol, colchicine, ezetimibe)  Consider alternative to ceftriaxone (i.e. Cipro, Bactrim) for UTI  Alternative antihypertensives may also be considered as appropriate  Changing multiple meds at once will prevent identification of the causative agent, if any.  If persistent, this issue should be followed up as an outpatient to identify the causative medication.  Please contact pharmacy with any additional inquiries. Thank you for this interesting consult,  Reuel Boom, PharmD Pager: [phone] 11/01/2014, 3:34 PM

## 2014-11-01 NOTE — Consult Note (Signed)
Reason for Consult:  A fib and abnormal troponins  Referring Physician: Dr. Candiss Norse   PCP:  Maximino Greenland, MD  Primary Cardiologist: Dr. Lenore Cordia is an 64 y.o. female.    Chief Complaint: presented 10/31/14 with  Rt flank pain, and urinary freq  HPI:63 y.o. female with PMH of hypertension, hyperlipidemia, diabetes mellitus, GERD, gout, coronary artery disease (post status of stent placement 2006), history of breast cancer (post status of mastectomy 2008), permanent atrial fibrillation on Pradaxa, asthma, OSA on CPAP, chronic kidney disease-stage 3, chronic bilateral leg edema on Lasix and support hose when she uses them, who presented with increased urinary frequency and right flank pain and SOB. She reports the SOB came with the pain.   Now with mildly elevated troponin.  <0.03--> 0.05-->  0.03 --> 0.04 BNP 179.8 CXR with no acute changes.  She has been hypokalemic. Last K+ 2.9, anemia with H/H 9.9/29.8   Has LBBB - no chest pain.   EKG a fib with LBBB but no acute changes from previous.  Previous echo 2013 with EF 50-55% severe Lt atrial enlargement and mod to severe rt atrial enlargement.  Mild to mod MR.  Mod Pul. HTN  With PA pressure 50-60. Another echo ordered.     Past Medical History  Diagnosis Date  . CAD S/P percutaneous coronary angioplasty 2006;     PCI of circumflex with Taxus DES;; relook-cath Feb 2013: 50-60% short lesion in RCA, 40% ISR Circumflex stent.  . Stented coronary artery     40% ISR in Circumflex stent  . Hypertension   . Hyperlipidemia   . Morbid obesity     BMI 41  . Breast cancer 2008    S/P mastectomy  . Atrial fibrillation, permanent   . Asthma   . 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 OSA on CPAP     On CPAP  . Gout   . Diabetes mellitus, uncontrolled 07/14/2011  . History of echocardiogram 07/22/2011  . Edema of both legs     Usually mild, chronic. Controlled with when necessary furosemide and diet    . CKD (chronic kidney disease), stage III     Past Surgical History  Procedure Laterality Date  . Coronary angioplasty with stent placement  2006    stent to circumflex  . Cardiac catheterization  07/13/11    normal EF; 74m 50-60% lesion in RCA; patent stent in circumflex form 2006 with ~40% in-stent re-stenosis  . Mastectomy  2008    left  . Transthoracic echocardiogram  March 2013    LV borderline dilated; borderline concentric LVH; EF 551-83% RV sysolic function mildly reduced; LA severely dilated; RA mod-severely dilated; mild-mod MR; mild TR; mod pulm HTN;   . Left heart catheterization with coronary angiogram N/A 07/13/2011    Procedure: LEFT HEART CATHETERIZATION WITH CORONARY ANGIOGRAM;  Surgeon: DLeonie Man MD;  [facility];  Service: Cardiovascular;  Laterality: N/A;    Family History  Problem Relation Age of Onset  . Coronary artery disease Mother   . Hypertension Mother   . Atrial fibrillation Son    Social History:  reports that she quit smoking about 22 years ago. Her smoking use included Cigarettes. She has a 3 pack-year smoking history. She has never used smokeless tobacco. She reports that she does not drink alcohol or use illicit drugs.  Allergies: No Known Allergies   OUTPATIENT MEDICATIONS:  No current facility-administered medications on file prior to encounter.   Current Outpatient Prescriptions on File Prior to Encounter  Medication Sig Dispense Refill  . allopurinol (ZYLOPRIM) 300 MG tablet Take 300 mg by mouth daily as needed (for gout flares). Gout    . cholecalciferol (VITAMIN D) 1000 UNITS tablet Take 2,000 Units by mouth daily.    . colchicine 0.6 MG tablet Take 0.6 mg by mouth daily as needed. Take 2 tablets intially, then 1 tablet in 2 hours , Then 1 tablet daily For gout    . dabigatran (PRADAXA) 150 MG CAPS capsule Take 1 capsule (150 mg total) by mouth every 12 (twelve) hours. 60 capsule 0  . ezetimibe (ZETIA) 10 MG tablet Take  1 tablet (10 mg total) by mouth daily. 28 tablet 0  . ferrous sulfate 325 (65 FE) MG EC tablet Take 325 mg by mouth daily with breakfast.    . fluticasone (FLONASE) 50 MCG/ACT nasal spray Place 2 sprays into the nose as needed for rhinitis.    . furosemide (LASIX) 80 MG tablet Take 0.5-1 tablets (40-80 mg total) by mouth 2 (two) times daily. Please do not take on Saturday and Sunday. May resume from Monday. (Patient taking differently: Take 80 mg by mouth every morning. Please do not take on Saturday and Sunday. May resume from Monday.) 60 tablet 11  . isosorbide mononitrate (IMDUR) 60 MG 24 hr tablet Take 1 tablet (60 mg total) by mouth daily. 30 tablet 11  . levalbuterol (XOPENEX) 0.63 MG/3ML nebulizer solution Take 3 mLs (0.63 mg total) by nebulization every 6 (six) hours as needed for wheezing or shortness of breath. 270 mL 3  . LORazepam (ATIVAN) 1 MG tablet Take 1 mg by mouth at bedtime.     . Multiple Vitamins-Minerals (MULTIVITAMIN WITH MINERALS) tablet Take 1 tablet by mouth daily.    . nebivolol (BYSTOLIC) 5 MG tablet Take 1 tablet (5 mg total) by mouth daily. 30 tablet 1  . nitroGLYCERIN (NITROSTAT) 0.4 MG SL tablet Place 1 tablet (0.4 mg total) under the tongue every 5 (five) minutes as needed for chest pain. 25 tablet 4  . pantoprazole (PROTONIX) 40 MG tablet Take 1 tablet (40 mg total) by mouth daily. 30 tablet 11  . potassium chloride SA (K-DUR,KLOR-CON) 20 MEQ tablet Take 1 tablet (20 mEq total) by mouth 2 (two) times daily. 60 tablet 11  . valsartan-hydrochlorothiazide (DIOVAN-HCT) 320-25 MG per tablet Take 1 tablet by mouth daily. 30 tablet 11  . GLUCOPHAGE 850 MG tablet Take 850 mg by mouth 2 (two) times daily with a meal.      CURRENT MEDS: Scheduled Meds: . albuterol  2.5 mg Nebulization Q6H  . amLODipine  5 mg Oral Daily  . cefTRIAXone (ROCEPHIN)  IV  1 g Intravenous Q24H  . cholecalciferol  2,000 Units Oral Daily  . dabigatran  150 mg Oral Q12H  . ezetimibe  10 mg Oral  Daily  . feeding supplement (ENSURE ENLIVE)  237 mL Oral BID BM  . ferrous sulfate  325 mg Oral Q breakfast  . fluticasone  2 spray Each Nare Daily  . insulin aspart  0-9 Units Subcutaneous TID WC  . isosorbide mononitrate  60 mg Oral Daily  . LORazepam  1 mg Oral QHS  . multivitamin with minerals  1 tablet Oral Daily  . nebivolol  5 mg Oral Daily  . pantoprazole  40 mg Oral Daily  . potassium chloride  10 mEq Intravenous Q1 Hr x  6  . potassium chloride  40 mEq Oral Q4H  . sodium chloride  3 mL Intravenous Q12H   Continuous Infusions: . sodium chloride 75 mL/hr at 11/01/14 0903   PRN Meds:.allopurinol, alum & mag hydroxide-simeth, colchicine, hydrALAZINE, HYDROcodone-acetaminophen, morphine injection, nitroGLYCERIN, ondansetron **OR** ondansetron (ZOFRAN) IV   Results for orders placed or performed during the hospital encounter of 10/31/14 (from the past 48 hour(s))  CBC with Differential     Status: Abnormal   Collection Time: 10/31/14  3:26 PM  Result Value Ref Range   WBC 10.2 4.0 - 10.5 K/uL   RBC 3.93 3.87 - 5.11 MIL/uL   Hemoglobin 10.9 (L) 12.0 - 15.0 g/dL   HCT 32.8 (L) 36.0 - 46.0 %   MCV 83.5 78.0 - 100.0 fL   MCH 27.7 26.0 - 34.0 pg   MCHC 33.2 30.0 - 36.0 g/dL   RDW 15.6 (H) 11.5 - 15.5 %   Platelets 160 150 - 400 K/uL   Neutrophils Relative % 80 (H) 43 - 77 %   Neutro Abs 8.2 (H) 1.7 - 7.7 K/uL   Lymphocytes Relative 11 (L) 12 - 46 %   Lymphs Abs 1.1 0.7 - 4.0 K/uL   Monocytes Relative 9 3 - 12 %   Monocytes Absolute 0.9 0.1 - 1.0 K/uL   Eosinophils Relative 0 0 - 5 %   Eosinophils Absolute 0.0 0.0 - 0.7 K/uL   Basophils Relative 0 0 - 1 %   Basophils Absolute 0.0 0.0 - 0.1 K/uL  Comprehensive metabolic panel     Status: Abnormal   Collection Time: 10/31/14  3:26 PM  Result Value Ref Range   Sodium 138 135 - 145 mmol/L   Potassium 2.5 (LL) 3.5 - 5.1 mmol/L    Comment: REPEATED TO VERIFY CRITICAL RESULT CALLED TO, READ BACK BY AND VERIFIED WITH: C.CARNEAL  RN AT 1640 ON 6.15.16 BY W.BUCHHOLZ.    Chloride 99 (L) 101 - 111 mmol/L   CO2 29 22 - 32 mmol/L   Glucose, Bld 125 (H) 65 - 99 mg/dL   BUN 37 (H) 6 - 20 mg/dL   Creatinine, Ser 1.49 (H) 0.44 - 1.00 mg/dL   Calcium 8.9 8.9 - 10.3 mg/dL   Total Protein 7.1 6.5 - 8.1 g/dL   Albumin 3.3 (L) 3.5 - 5.0 g/dL   AST 45 (H) 15 - 41 U/L   ALT 65 (H) 14 - 54 U/L   Alkaline Phosphatase 109 38 - 126 U/L   Total Bilirubin 3.5 (H) 0.3 - 1.2 mg/dL   GFR calc non Af Amer 36 (L) >60 mL/min   GFR calc Af Amer 41 (L) >60 mL/min    Comment: (NOTE) The eGFR has been calculated using the CKD EPI equation. This calculation has not been validated in all clinical situations. eGFR's persistently <60 mL/min signify possible Chronic Kidney Disease.    Anion gap 10 5 - 15  Lipase, blood     Status: None   Collection Time: 10/31/14  3:26 PM  Result Value Ref Range   Lipase 25 22 - 51 U/L  Basic metabolic panel     Status: Abnormal   Collection Time: 10/31/14  5:46 PM  Result Value Ref Range   Sodium 138 135 - 145 mmol/L   Potassium 2.6 (LL) 3.5 - 5.1 mmol/L    Comment: REPEATED TO VERIFY CRITICAL RESULT CALLED TO, READ BACK BY AND VERIFIED WITH: FM.HASZ RN AT 1583 ON 6.15.16 BY W.BUCHHOLZ.    Chloride  97 (L) 101 - 111 mmol/L   CO2 28 22 - 32 mmol/L   Glucose, Bld 112 (H) 65 - 99 mg/dL   BUN 38 (H) 6 - 20 mg/dL   Creatinine, Ser 1.50 (H) 0.44 - 1.00 mg/dL   Calcium 8.9 8.9 - 10.3 mg/dL   GFR calc non Af Amer 35 (L) >60 mL/min   GFR calc Af Amer 41 (L) >60 mL/min    Comment: (NOTE) The eGFR has been calculated using the CKD EPI equation. This calculation has not been validated in all clinical situations. eGFR's persistently <60 mL/min signify possible Chronic Kidney Disease.    Anion gap 13 5 - 15  Magnesium     Status: None   Collection Time: 10/31/14  7:24 PM  Result Value Ref Range   Magnesium 1.7 1.7 - 2.4 mg/dL  Bilirubin, direct     Status: Abnormal   Collection Time: 10/31/14  9:34 PM    Result Value Ref Range   Bilirubin, Direct 2.0 (H) 0.1 - 0.5 mg/dL  Troponin I (q 6hr x 3)     Status: Abnormal   Collection Time: 10/31/14  9:34 PM  Result Value Ref Range   Troponin I 0.05 (H) <0.031 ng/mL    Comment:        PERSISTENTLY INCREASED TROPONIN VALUES IN THE RANGE OF 0.04-0.49 ng/mL CAN BE SEEN IN:       -UNSTABLE ANGINA       -CONGESTIVE HEART FAILURE       -MYOCARDITIS       -CHEST TRAUMA       -ARRYHTHMIAS       -LATE PRESENTING MYOCARDIAL INFARCTION       -COPD   CLINICAL FOLLOW-UP RECOMMENDED.   Procalcitonin     Status: None   Collection Time: 10/31/14  9:34 PM  Result Value Ref Range   Procalcitonin 43.79 ng/mL    Comment:        Interpretation: PCT >= 10 ng/mL: Important systemic inflammatory response, almost exclusively due to severe bacterial sepsis or septic shock. (NOTE)         ICU PCT Algorithm               Non ICU PCT Algorithm    ----------------------------     ------------------------------         PCT < 0.25 ng/mL                 PCT < 0.1 ng/mL     Stopping of antibiotics            Stopping of antibiotics       strongly encouraged.               strongly encouraged.    ----------------------------     ------------------------------       PCT level decrease by               PCT < 0.25 ng/mL       >= 80% from peak PCT       OR PCT 0.25 - 0.5 ng/mL          Stopping of antibiotics                                             encouraged.     Stopping of antibiotics  encouraged.    ----------------------------     ------------------------------       PCT level decrease by              PCT >= 0.25 ng/mL       < 80% from peak PCT        AND PCT >= 0.5 ng/mL             Continuing antibiotics                                              encouraged.       Continuing antibiotics            encouraged.    ----------------------------     ------------------------------     PCT level increase compared          PCT > 0.5 ng/mL          with peak PCT AND          PCT >= 0.5 ng/mL             Escalation of antibiotics                                          strongly encouraged.      Escalation of antibiotics        strongly encouraged.   Protime-INR     Status: Abnormal   Collection Time: 10/31/14  9:34 PM  Result Value Ref Range   Prothrombin Time 16.9 (H) 11.6 - 15.2 seconds   INR 1.36 0.00 - 1.49  APTT     Status: Abnormal   Collection Time: 10/31/14  9:34 PM  Result Value Ref Range   aPTT 61 (H) 24 - 37 seconds    Comment:        IF BASELINE aPTT IS ELEVATED, SUGGEST PATIENT RISK ASSESSMENT BE USED TO DETERMINE APPROPRIATE ANTICOAGULANT THERAPY.   Lactic acid, plasma     Status: None   Collection Time: 10/31/14  9:40 PM  Result Value Ref Range   Lactic Acid, Venous 1.4 0.5 - 2.0 mmol/L  Glucose, capillary     Status: None   Collection Time: 10/31/14  9:59 PM  Result Value Ref Range   Glucose-Capillary 99 65 - 99 mg/dL  Urine rapid drug screen (hosp performed)     Status: Abnormal   Collection Time: 10/31/14 10:03 PM  Result Value Ref Range   Opiates POSITIVE (A) NONE DETECTED   Cocaine NONE DETECTED NONE DETECTED   Benzodiazepines NONE DETECTED NONE DETECTED   Amphetamines NONE DETECTED NONE DETECTED   Tetrahydrocannabinol NONE DETECTED NONE DETECTED   Barbiturates NONE DETECTED NONE DETECTED    Comment:        DRUG SCREEN FOR MEDICAL PURPOSES ONLY.  IF CONFIRMATION IS NEEDED FOR ANY PURPOSE, NOTIFY LAB WITHIN 5 DAYS.        LOWEST DETECTABLE LIMITS FOR URINE DRUG SCREEN Drug Class       Cutoff (ng/mL) Amphetamine      1000 Barbiturate      200 Benzodiazepine   184 Tricyclics       037 Opiates          300 Cocaine          300  THC              50   Creatinine, urine, random     Status: None   Collection Time: 10/31/14 10:03 PM  Result Value Ref Range   Creatinine, Urine 130.16 mg/dL    Comment: Performed at [facility]  Lactic acid, plasma     Status: None   Collection Time:  11/01/14 12:08 AM  Result Value Ref Range   Lactic Acid, Venous 1.5 0.5 - 2.0 mmol/L  Troponin I (q 6hr x 3)     Status: None   Collection Time: 11/01/14  2:53 AM  Result Value Ref Range   Troponin I 0.03 <0.031 ng/mL    Comment:        NO INDICATION OF MYOCARDIAL INJURY.   Brain natriuretic peptide     Status: Abnormal   Collection Time: 11/01/14  2:53 AM  Result Value Ref Range   B Natriuretic Peptide 179.8 (H) 0.0 - 100.0 pg/mL  Comprehensive metabolic panel     Status: Abnormal   Collection Time: 11/01/14  2:53 AM  Result Value Ref Range   Sodium 139 135 - 145 mmol/L   Potassium 2.9 (L) 3.5 - 5.1 mmol/L   Chloride 101 101 - 111 mmol/L   CO2 27 22 - 32 mmol/L   Glucose, Bld 106 (H) 65 - 99 mg/dL   BUN 37 (H) 6 - 20 mg/dL   Creatinine, Ser 1.62 (H) 0.44 - 1.00 mg/dL   Calcium 8.4 (L) 8.9 - 10.3 mg/dL   Total Protein 6.3 (L) 6.5 - 8.1 g/dL   Albumin 2.8 (L) 3.5 - 5.0 g/dL   AST 33 15 - 41 U/L   ALT 50 14 - 54 U/L   Alkaline Phosphatase 95 38 - 126 U/L   Total Bilirubin 2.4 (H) 0.3 - 1.2 mg/dL   GFR calc non Af Amer 32 (L) >60 mL/min   GFR calc Af Amer 37 (L) >60 mL/min    Comment: (NOTE) The eGFR has been calculated using the CKD EPI equation. This calculation has not been validated in all clinical situations. eGFR's persistently <60 mL/min signify possible Chronic Kidney Disease.    Anion gap 11 5 - 15  CBC     Status: Abnormal   Collection Time: 11/01/14  2:53 AM  Result Value Ref Range   WBC 9.2 4.0 - 10.5 K/uL   RBC 3.55 (L) 3.87 - 5.11 MIL/uL   Hemoglobin 9.9 (L) 12.0 - 15.0 g/dL   HCT 29.8 (L) 36.0 - 46.0 %   MCV 83.9 78.0 - 100.0 fL   MCH 27.9 26.0 - 34.0 pg   MCHC 33.2 30.0 - 36.0 g/dL   RDW 15.7 (H) 11.5 - 15.5 %   Platelets 137 (L) 150 - 400 K/uL  Glucose, capillary     Status: None   Collection Time: 11/01/14  7:47 AM  Result Value Ref Range   Glucose-Capillary 85 65 - 99 mg/dL  Troponin I (q 6hr x 3)     Status: Abnormal   Collection Time:  11/01/14  9:00 AM  Result Value Ref Range   Troponin I 0.04 (H) <0.031 ng/mL    Comment:        PERSISTENTLY INCREASED TROPONIN VALUES IN THE RANGE OF 0.04-0.49 ng/mL CAN BE SEEN IN:       -UNSTABLE ANGINA       -CONGESTIVE HEART FAILURE       -MYOCARDITIS       -  CHEST TRAUMA       -ARRYHTHMIAS       -LATE PRESENTING MYOCARDIAL INFARCTION       -COPD   CLINICAL FOLLOW-UP RECOMMENDED.   Magnesium     Status: None   Collection Time: 11/01/14  9:00 AM  Result Value Ref Range   Magnesium 1.9 1.7 - 2.4 mg/dL   No results found.  ROS: General:no colds or fevers,  weight down from 240 to 228 since Jan.  Skin:no rashes or ulcers HEENT:no blurred vision, no congestion CV:see HPI PUL:see HPI GI:no diarrhea,  +constipation or melena, no indigestion GU:no hematuria, no dysuria MS:no joint pain, no claudication Neuro:no syncope, no lightheadedness Endo:+ diabetes, no thyroid disease   Blood pressure 124/70, pulse 80, temperature 97.7 F (36.5 C), temperature source Oral, resp. rate 24, height _0  (1.6 m), weight 228 lb 1.6 oz (103.465 kg), SpO2 98 %.  Wt Readings from Last 3 Encounters:  10/31/14 228 lb 1.6 oz (103.465 kg)  06/11/14 240 lb 6.4 oz (109.045 kg)  07/01/13 228 lb 9.9 oz (103.7 kg)    PE: General:Pleasant affect, pleasant obese woman in NAD Skin:Warm and dry, brisk capillary refill HEENT:normocephalic, sclera clear, mucus membranes moist Neck:supple, no JVD, no bruits  Heart:S1S2 irreg irreg without murmur, gallup, rub or click Lungs:clear without rales, rhonchi. Few end-expiratory wheezes QIW:LNLGX soft, mild tenderness rt lower quad, + BS, do not palpate liver spleen or masses Ext:no lower ext edema, 2+ pedal pulses, 2+ radial pulses Neuro:alert and oriented X 3, MAE, follows commands, + facial symmetry    Assessment/Plan Principal Problem:   Pyelonephritis, abd U/S pending Active Problems:   Hyperlipidemia- on zetia followed by PCP   Sleep apnea, on  C-Pap   Diabetes mellitus- per Im   Essential hypertension   CAD S/P percutaneous coronary angioplasty- no chest pain, mild troponin bump    Atrial fibrillation, permanent- mostly rate controlled, elevated with pain   Edema of both legs- non today   Severe obesity (BMI >= 40)   Hypokalemia- receiving IV runs   Acute renal failure superimposed on stage 3 chronic kidney disease   Abnormal liver function  1. Abnormal troponin, no chest pain most likely demand ischemia, no acute changes with EKG.  2. permanent a fib on pradaxa  CHA2DS2Vasc score 5 continue anticoagulation  MD to see.  [place] Practitioner Certified [place] Pager [phone] or after 5pm or weekends call [phone] 11/01/2014, 11:07 AM    Patient seen, examined. Available data reviewed. Agree with findings, assessment, and plan as outlined by Cecilie Kicks, NP. Pt here with possible pyelonephritis. Exam as above - I have made changes where needed. She has minimal troponin abnormality in the setting of acute kidney injury and noncardiac illness. Atrial fib is controlled and she is anticoagulated with pradaxa. She has had no hx of bleeding problems. I don't think there are any acute cardiac issues. Note med changes as per primary team because of AKI and hypokalemia. I don't think a troponin of 0.04/0.05 in this clinical context has any prognostic implication. Will sign off. She should keep regular follow-up with Dr Ellyn Hack.  Sherren Mocha, M.D. 11/01/2014 1:52 PM

## 2014-11-01 NOTE — Progress Notes (Signed)
Pt stated that she will self administer CPAP when ready for bed.  Current settings are Auto CPAP 5-20 CMH20.  Pt to call if any assistance is needed. RT to monitor and assess as needed.

## 2014-11-01 NOTE — Progress Notes (Signed)
OT Cancellation Note  Patient Details Name: Stephanie Franco MRN: [id number] DOB: [birth year]   Cancelled Treatment:    Reason Eval/Treat Not Completed: OT screened, no needs identified, will sign off  [place], Thereasa Parkin 11/01/2014, 12:04 PM

## 2014-11-02 ENCOUNTER — Other Ambulatory Visit: Payer: Self-pay | Admitting: Cardiology

## 2014-11-02 DIAGNOSIS — I2510 Atherosclerotic heart disease of native coronary artery without angina pectoris: Secondary | ICD-10-CM

## 2014-11-02 DIAGNOSIS — R0602 Shortness of breath: Secondary | ICD-10-CM

## 2014-11-02 DIAGNOSIS — N183 Chronic kidney disease, stage 3 (moderate): Secondary | ICD-10-CM

## 2014-11-02 DIAGNOSIS — I5042 Chronic combined systolic (congestive) and diastolic (congestive) heart failure: Secondary | ICD-10-CM | POA: Insufficient documentation

## 2014-11-02 DIAGNOSIS — I5032 Chronic diastolic (congestive) heart failure: Secondary | ICD-10-CM | POA: Insufficient documentation

## 2014-11-02 DIAGNOSIS — I429 Cardiomyopathy, unspecified: Secondary | ICD-10-CM

## 2014-11-02 DIAGNOSIS — I5022 Chronic systolic (congestive) heart failure: Secondary | ICD-10-CM

## 2014-11-02 LAB — BASIC METABOLIC PANEL
ANION GAP: 8 (ref 5–15)
BUN: 32 mg/dL — ABNORMAL HIGH (ref 6–20)
CHLORIDE: 106 mmol/L (ref 101–111)
CO2: 25 mmol/L (ref 22–32)
Calcium: 8.8 mg/dL — ABNORMAL LOW (ref 8.9–10.3)
Creatinine, Ser: 1.36 mg/dL — ABNORMAL HIGH (ref 0.44–1.00)
GFR calc Af Amer: 46 mL/min — ABNORMAL LOW (ref >60)
GFR calc non Af Amer: 40 mL/min — ABNORMAL LOW (ref >60)
Glucose, Bld: 125 mg/dL — ABNORMAL HIGH (ref 65–99)
Potassium: 4 mmol/L (ref 3.5–5.1)
SODIUM: 139 mmol/L (ref 135–145)

## 2014-11-02 LAB — HEMOGLOBIN A1C
Hgb A1c MFr Bld: 6.8 % — ABNORMAL HIGH (ref 4.8–5.6)
Mean Plasma Glucose: 148 mg/dL

## 2014-11-02 LAB — HEPATITIS PANEL, ACUTE
HCV Ab: <0.1 s/co ratio (ref 0.0–0.9)
HEP A IGM: NEGATIVE
Hep B C IgM: NEGATIVE
Hepatitis B Surface Ag: NEGATIVE

## 2014-11-02 LAB — HEPATIC FUNCTION PANEL
ALT: 43 U/L (ref 14–54)
AST: 28 U/L (ref 15–41)
Albumin: 2.9 g/dL — ABNORMAL LOW (ref 3.5–5.0)
Alkaline Phosphatase: 103 U/L (ref 38–126)
Bilirubin, Direct: 0.7 mg/dL — ABNORMAL HIGH (ref 0.1–0.5)
Indirect Bilirubin: 0.9 mg/dL (ref 0.3–0.9)
Total Bilirubin: 1.6 mg/dL — ABNORMAL HIGH (ref 0.3–1.2)
Total Protein: 6.6 g/dL (ref 6.5–8.1)

## 2014-11-02 LAB — URINE CULTURE

## 2014-11-02 LAB — GLUCOSE, CAPILLARY
GLUCOSE-CAPILLARY: 186 mg/dL — AB (ref 65–99)
GLUCOSE-CAPILLARY: 124 mg/dL — AB (ref 65–99)
GLUCOSE-CAPILLARY: 194 mg/dL — AB (ref 65–99)
Glucose-Capillary: 145 mg/dL — ABNORMAL HIGH (ref 65–99)

## 2014-11-02 LAB — UREA NITROGEN, URINE: Urea Nitrogen, Ur: 640 mg/dL

## 2014-11-02 LAB — MAGNESIUM: Magnesium: 2.2 mg/dL (ref 1.7–2.4)

## 2014-11-02 MED ORDER — LEVOFLOXACIN 500 MG PO TABS
500.0000 mg | ORAL_TABLET | Freq: Once | ORAL | Status: AC
  Administered 2014-11-02: 500 mg via ORAL
  Filled 2014-11-02: qty 1

## 2014-11-02 MED ORDER — NEBIVOLOL HCL 10 MG PO TABS
10.0000 mg | ORAL_TABLET | Freq: Every day | ORAL | Status: DC
  Administered 2014-11-02 – 2014-11-03 (×2): 10 mg via ORAL
  Filled 2014-11-02 (×2): qty 1

## 2014-11-02 MED ORDER — HEPARIN SOD (PORK) LOCK FLUSH 100 UNIT/ML IV SOLN
500.0000 [IU] | INTRAVENOUS | Status: DC
  Filled 2014-11-02: qty 5

## 2014-11-02 MED ORDER — ALBUTEROL SULFATE (2.5 MG/3ML) 0.083% IN NEBU
2.5000 mg | INHALATION_SOLUTION | Freq: Two times a day (BID) | RESPIRATORY_TRACT | Status: DC
  Administered 2014-11-02 (×2): 2.5 mg via RESPIRATORY_TRACT
  Filled 2014-11-02 (×3): qty 3

## 2014-11-02 MED ORDER — HEPARIN SOD (PORK) LOCK FLUSH 100 UNIT/ML IV SOLN
500.0000 [IU] | INTRAVENOUS | Status: DC | PRN
  Filled 2014-11-02: qty 5

## 2014-11-02 MED ORDER — AMLODIPINE BESYLATE 2.5 MG PO TABS
2.5000 mg | ORAL_TABLET | Freq: Every day | ORAL | Status: DC
  Administered 2014-11-02: 2.5 mg via ORAL
  Filled 2014-11-02: qty 1

## 2014-11-02 MED ORDER — LISINOPRIL 2.5 MG PO TABS
2.5000 mg | ORAL_TABLET | Freq: Every day | ORAL | Status: DC
  Administered 2014-11-03: 2.5 mg via ORAL
  Filled 2014-11-02: qty 1

## 2014-11-02 MED ORDER — LISINOPRIL 2.5 MG PO TABS
2.5000 mg | ORAL_TABLET | Freq: Every day | ORAL | Status: DC
  Administered 2014-11-02: 2.5 mg via ORAL
  Filled 2014-11-02: qty 1

## 2014-11-02 NOTE — Discharge Instructions (Signed)
Follow with Primary MD Maximino Greenland, MD in 3 days   Get CBC, CMP, 2 view Chest X ray checked  by Primary MD next visit.    Activity: As tolerated with Full fall precautions use walker/cane & assistance as needed   Disposition Home     Diet: Heart Healthy Low Carb.Check your Weight same time everyday, if you gain over 2 pounds, or you develop in leg swelling, experience more shortness of breath or chest pain, call your Primary MD immediately. Follow Cardiac Low Salt Diet and 1.5 lit/day fluid restriction.   On your next visit with your primary care physician please Get Medicines reviewed and adjusted.   Please request your Prim.MD to go over all Hospital Tests and Procedure/Radiological results at the follow up, please get all Hospital records sent to your Prim MD by signing hospital release before you go home.   If you experience worsening of your admission symptoms, develop shortness of breath, life threatening emergency, suicidal or homicidal thoughts you must seek medical attention immediately by calling 911 or calling your MD immediately  if symptoms less severe.  You Must read complete instructions/literature along with all the possible adverse reactions/side effects for all the Medicines you take and that have been prescribed to you. Take any new Medicines after you have completely understood and accpet all the possible adverse reactions/side effects.   Do not drive, operating heavy machinery, perform activities at heights, swimming or participation in water activities or provide baby sitting services if your were admitted for syncope or siezures until you have seen by Primary MD or a Neurologist and advised to do so again.  Do not drive when taking Pain medications.    Do not take more than prescribed Pain, Sleep and Anxiety Medications  Special Instructions: If you have smoked or chewed Tobacco  in the last 2 yrs please stop smoking, stop any regular Alcohol  and or any  Recreational drug use.  Wear Seat belts while driving.   Please note  You were cared for by a hospitalist during your hospital stay. If you have any questions about your discharge medications or the care you received while you were in the hospital after you are discharged, you can call the unit and asked to speak with the hospitalist on call if the hospitalist that took care of you is not available. Once you are discharged, your primary care physician will handle any further medical issues. Please note that NO REFILLS for any discharge medications will be authorized once you are discharged, as it is imperative that you return to your primary care physician (or establish a relationship with a primary care physician if you do not have one) for your aftercare needs so that they can reassess your need for medications and monitor your lab values.  Dr. Allison Quarry office will call you to schedule stress test at his office. To have lab work the lab work should be done next week on Thursday on first floor of Dr. Allison Quarry office   We will schedule you a follow up but it will be at the [address] office just to see you sooner.    Weigh daily Call [phone] if weight climbs more than 3 pounds in a day or 5 pounds in a week. No salt to very little salt in your diet.  No more than 2000 mg in a day. Call if increased shortness of breath or increased swelling.

## 2014-11-02 NOTE — Progress Notes (Addendum)
Patient Demographics:    Stephanie Franco, is a 64 y.o. female, DOB - 06-20-1950, KW:[id number]  Admit date - 10/31/2014   Admitting Physician Ivor Costa, MD  Outpatient Primary MD for the patient is Maximino Greenland, MD  LOS - 2   Chief Complaint  Patient presents with  . Shortness of Breath  . Abdominal Pain  . Urinary Frequency        Subjective:    Stephanie Franco today has, No headache, No chest pain, No abdominal pain - No Nausea, No new weakness tingling or numbness, No Cough - SOB.     Assessment  & Plan :     1. Suprapubic pain. Likely secondary to constipation, patient says she had had no bowel movements for the last 3 days. Pain much improved after BM last night. Currently symptom-free. There is question of some low-grade Escherichia coli infection per last cultures. She's been treated with Rocephin will give at least 3 doses since it has been started. Abdominal ultrasound pending to rule out pyelonephritis. However her pain was suprapubic and I doubt this was the case.   2. Chronic atrial fibrillation [place] VASC score 3 - on Bystolic along with Pradaxa, continue. Goal will be rate control.    3. Mildly elevated troponin with left bundle branch block, cannot rule out aberrant conduction with underlying A. fib. Trop rise in NON ACS pattern, continue beta blocker along with Pradaxa. She is pain-free.    4. Elevated total bilirubin with high direct bilirubin. Other liver enzymes are essentially stable, abdominal ultrasound stable, HIV and hepatitis panel negative. She is pain-free in the right upper quadrant. Trend improving.    5.Chr Systolic CHF EF [id number] - defer to Cards, no ACE/ARB due to ARF, Cards following.    6.ARF - improving, hydrated, monitor.     Code Status :  Full  Family Communication  : None present  Disposition Plan  : Home 1-2 days  Consults  :  Cards  Procedures  :  TTE - Impressions: Notes indicate addition definity images but I cannotfind any other echo imagesLV systolic function is severely depressed in 25% range withdiffuse hypokinesisand abnormal septal motion Limited Study   Abd [place]  1. Biliary sludge.  No stones. 2. Medical renal disease of the RIGHT kidney. LEFT kidney appears normal. 3. Hyperechoic lesion within the spleen, nonspecific but most likely representing hemangioma. 4. Enlarged hepatic veins and inferior vena cava, likely related to volume status/CHF when compared to chest radiograph 10/28/2014.    DVT Prophylaxis  :  Pradaxa  Lab Results  Component Value Date   PLT 137* 11/01/2014    Inpatient Medications  Scheduled Meds: . albuterol  2.5 mg Nebulization BID  . amLODipine  2.5 mg Oral Daily  . cefTRIAXone (ROCEPHIN)  IV  1 g Intravenous Q24H  . cholecalciferol  2,000 Units Oral Daily  . dabigatran  150 mg Oral Q12H  . ezetimibe  10 mg Oral Daily  . feeding supplement (ENSURE ENLIVE)  237 mL Oral BID BM  . ferrous sulfate  325 mg Oral Q breakfast  . fluticasone  2 spray Each Nare Daily  . heparin lock flush  500 Units Intracatheter Q30 days  . insulin aspart  0-9 Units Subcutaneous  TID WC  . isosorbide mononitrate  60 mg Oral Daily  . lisinopril  2.5 mg Oral Daily  . LORazepam  1 mg Oral QHS  . multivitamin with minerals  1 tablet Oral Daily  . nebivolol  10 mg Oral Daily  . pantoprazole  40 mg Oral Daily  . sodium chloride  3 mL Intravenous Q12H   Continuous Infusions: . sodium chloride 75 mL/hr at 11/01/14 0903   PRN Meds:.allopurinol, alum & mag hydroxide-simeth, colchicine, heparin lock flush **AND** heparin lock flush, hydrALAZINE, HYDROcodone-acetaminophen, morphine injection, nitroGLYCERIN, ondansetron **OR** ondansetron (ZOFRAN) IV  Antibiotics  :     Anti-infectives    Start      Dose/Rate Route Frequency Ordered Stop   10/31/14 2200  cefTRIAXone (ROCEPHIN) 1 g in dextrose 5 % 50 mL IVPB - Premix     1 g 100 mL/hr over 30 Minutes Intravenous Every 24 hours 10/31/14 2055     10/31/14 1615  sulfamethoxazole-trimethoprim (BACTRIM DS,SEPTRA DS) 800-160 MG per tablet 1 tablet     1 tablet Oral  Once 10/31/14 1608 10/31/14 1613        Objective:   Filed Vitals:   11/01/14 1923 11/01/14 2052 11/02/14 0524 11/02/14 0754  BP:  127/71 113/52   Pulse:  94 91   Temp:  99.1 F (37.3 C) 100.2 F (37.9 C)   TempSrc:  Oral Oral   Resp:  20 20   Height:      Weight:      SpO2: 97% 96% 98% 99%    Wt Readings from Last 3 Encounters:  10/31/14 103.465 kg (228 lb 1.6 oz)  06/11/14 109.045 kg (240 lb 6.4 oz)  07/01/13 103.7 kg (228 lb 9.9 oz)     Intake/Output Summary (Last 24 hours) at 11/02/14 1249 Last data filed at 11/02/14 0600  Gross per 24 hour  Intake   1825 ml  Output    400 ml  Net   1425 ml     Physical Exam  Awake Alert, Oriented X 3, No new F.N deficits, Normal affect [place].AT,PERRAL Supple Neck,No JVD, No cervical lymphadenopathy appriciated.  Symmetrical Chest wall movement, Good air movement bilaterally, CTAB RRR,No Gallops,Rubs or new Murmurs, No Parasternal Heave +ve B.Sounds, Abd Soft, No tenderness, No organomegaly appriciated, No rebound - guarding or rigidity. No Cyanosis, Clubbing or edema, No new Rash or bruise      Data Review:   Micro Results Recent Results (from the past 240 hour(s))  Urine culture     Status: None   Collection Time: 10/28/14  2:40 PM  Result Value Ref Range Status   Specimen Description URINE, CATHETERIZED  Final   Special Requests NONE  Final   Colony Count   Final    40,000 COLONIES/ML Performed at [facility]    Culture   Final    ESCHERICHIA COLI ENTEROBACTER AEROGENES PROTEUS MIRABILIS Performed at [facility]    Report Status 10/31/2014 FINAL  Final   Organism ID, Bacteria  ESCHERICHIA COLI  Final   Organism ID, Bacteria ENTEROBACTER AEROGENES  Final   Organism ID, Bacteria PROTEUS MIRABILIS  Final      Susceptibility   Enterobacter aerogenes - MIC*    CEFAZOLIN RESISTANT      CEFTRIAXONE <=1 SENSITIVE Sensitive     CIPROFLOXACIN <=0.25 SENSITIVE Sensitive     GENTAMICIN <=1 SENSITIVE Sensitive     LEVOFLOXACIN <=0.12 SENSITIVE Sensitive     NITROFURANTOIN 64 INTERMEDIATE Intermediate  TOBRAMYCIN <=1 SENSITIVE Sensitive     TRIMETH/SULFA <=20 SENSITIVE Sensitive     PIP/TAZO <=4 SENSITIVE Sensitive     * ENTEROBACTER AEROGENES   Escherichia coli - MIC*    AMPICILLIN <=2 SENSITIVE Sensitive     CEFAZOLIN <=4 SENSITIVE Sensitive     CEFTRIAXONE <=1 SENSITIVE Sensitive     CIPROFLOXACIN <=0.25 SENSITIVE Sensitive     GENTAMICIN <=1 SENSITIVE Sensitive     LEVOFLOXACIN <=0.12 SENSITIVE Sensitive     NITROFURANTOIN <=16 SENSITIVE Sensitive     TOBRAMYCIN <=1 SENSITIVE Sensitive     TRIMETH/SULFA <=20 SENSITIVE Sensitive     PIP/TAZO <=4 SENSITIVE Sensitive     * ESCHERICHIA COLI   Proteus mirabilis - MIC*    AMPICILLIN <=2 SENSITIVE Sensitive     CEFAZOLIN <=4 SENSITIVE Sensitive     CEFTRIAXONE <=1 SENSITIVE Sensitive     CIPROFLOXACIN <=0.25 SENSITIVE Sensitive     GENTAMICIN <=1 SENSITIVE Sensitive     LEVOFLOXACIN <=0.12 SENSITIVE Sensitive     NITROFURANTOIN 128 RESISTANT Resistant     TOBRAMYCIN <=1 SENSITIVE Sensitive     TRIMETH/SULFA <=20 SENSITIVE Sensitive     PIP/TAZO <=4 SENSITIVE Sensitive     * PROTEUS MIRABILIS  Urine culture     Status: None   Collection Time: 10/31/14 10:04 PM  Result Value Ref Range Status   Specimen Description URINE, CLEAN CATCH  Final   Special Requests NONE  Final   Culture   Final    MULTIPLE SPECIES PRESENT, SUGGEST RECOLLECTION IF CLINICALLY INDICATED Performed at [facility]    Report Status 11/02/2014 FINAL  Final    Radiology Reports US Abdomen Complete  11/01/2014   CLINICAL DATA:   Abnormal liver function tests. Chronic kidney disease.  EXAM: ULTRASOUND ABDOMEN COMPLETE  COMPARISON:  Chest radiographs 10/28/2014. No previous cross-sectional imaging of the abdomen.  FINDINGS: Gallbladder: Dependently layering biliary sludge. No stones. Normal gallbladder wall at 3 mm. No sonographic Murphy sign.  Common bile duct: Diameter: 6 mm, within normal limits for age.  Liver: No focal lesion identified. Within normal limits in parenchymal echogenicity. Enlarged hepatic veins, likely related to volume status or cardiac output.  IVC: No abnormality visualized.  Pancreas: Visualized portion unremarkable.  Spleen: Size within normal limits. Echogenic mass is present within the spleen measuring 27 mm x 27 mm by 24 mm. This is nonspecific but commonly these represent hemangioma.  Right Kidney: Length: 12.0 cm. Echogenic cortex. No hydronephrosis.  Left Kidney: Length: 11.9 cm. Echogenicity within normal limits. No mass or hydronephrosis visualized.  Abdominal aorta: No aneurysm visualized.  Other findings: None.  IMPRESSION: 1. Biliary sludge.  No stones. 2. Medical renal disease of the RIGHT kidney. LEFT kidney appears normal. 3. Hyperechoic lesion within the spleen, nonspecific but most likely representing hemangioma. 4. Enlarged hepatic veins and inferior vena cava, likely related to volume status/CHF when compared to chest radiograph 10/28/2014.   Electronically Signed   By: Dereck Ligas M.D.   On: 11/01/2014 11:53   Dg Abd Acute W/chest  10/28/2014   CLINICAL DATA:  Sudden onset of shortness of breath. Low back and low abdominal pain today. History of coronary artery disease, hypertension, asthma and diabetes. Initial encounter.  EXAM: DG ABDOMEN ACUTE W/ 1V CHEST  COMPARISON:  Chest radiographs 06/29/2013 and 05/05/2013.  FINDINGS: Stable cardiomegaly and chronic vascular congestion. No confluent airspace opacity, edema or significant pleural effusion. Previous left mastectomy.  The bowel gas  pattern is normal. There is  no free intraperitoneal air or suspicious abdominal calcification. Small right pelvic calcification is likely a phlebolith. There are mild degenerative changes within the lumbar spine and both hips. Asymmetric ossification of the left iliolumbar ligament noted.  IMPRESSION: No acute cardiopulmonary or abdominal process demonstrated. Stable cardiomegaly and chronic vascular congestion.   Electronically Signed   By: Richardean Sale M.D.   On: 10/28/2014 15:08     CBC  Recent Labs Lab 10/28/14 1314 10/31/14 1526 11/01/14 0253  WBC 3.4* 10.2 9.2  HGB 11.8* 10.9* 9.9*  HCT 37.3 32.8* 29.8*  PLT 179 160 137*  MCV 87.6 83.5 83.9  MCH 27.7 27.7 27.9  MCHC 31.6 33.2 33.2  RDW 15.8* 15.6* 15.7*  LYMPHSABS  --  1.1  --   MONOABS  --  0.9  --   EOSABS  --  0.0  --   BASOSABS  --  0.0  --     Chemistries   Recent Labs Lab 10/28/14 1314 10/31/14 1526 10/31/14 1746 10/31/14 1924 11/01/14 0253 11/01/14 0900 11/02/14 0355  NA 139 138 138  --  139  --  139  K 4.5 2.5* 2.6*  --  2.9*  --  4.0  CL 103 99* 97*  --  101  --  106  CO2 26 29 28   --  27  --  25  GLUCOSE 100* 125* 112*  --  106*  --  125*  BUN 19 37* 38*  --  37*  --  32*  CREATININE 1.14* 1.49* 1.50*  --  1.62*  --  1.36*  CALCIUM 9.1 8.9 8.9  --  8.4*  --  8.8*  MG  --   --   --  1.7  --  1.9 2.2  AST  --  45*  --   --  33  --  28  ALT  --  65*  --   --  50  --  43  ALKPHOS  --  109  --   --  95  --  103  BILITOT  --  3.5*  --   --  2.4*  --  1.6*   ------------------------------------------------------------------------------------------------------------------ estimated creatinine clearance is 47.4 mL/min (by C-G formula based on Cr of 1.36). ------------------------------------------------------------------------------------------------------------------  Recent Labs  11/01/14 0253  HGBA1C 6.8*    ------------------------------------------------------------------------------------------------------------------ No results for input(s): CHOL, HDL, LDLCALC, TRIG, CHOLHDL, LDLDIRECT in the last 72 hours. ------------------------------------------------------------------------------------------------------------------ No results for input(s): TSH, T4TOTAL, T3FREE, THYROIDAB in the last 72 hours.  Invalid input(s): FREET3 ------------------------------------------------------------------------------------------------------------------ No results for input(s): VITAMINB12, FOLATE, FERRITIN, TIBC, IRON, RETICCTPCT in the last 72 hours.  Coagulation profile  Recent Labs Lab 10/31/14 2134  INR 1.36    No results for input(s): DDIMER in the last 72 hours.  Cardiac Enzymes  Recent Labs Lab 10/31/14 2134 11/01/14 0253 11/01/14 0900  TROPONINI 0.05* 0.03 0.04*   ------------------------------------------------------------------------------------------------------------------ Invalid input(s): POCBNP   Time Spent in minutes   35   SINGH,PRASHANT K M.D on 11/02/2014 at 12:49 PM  Between 7am to 7pm - Pager - [phone]  After 7pm go to [email or web address] - password [facility]  [facility]   Office  [phone]

## 2014-11-02 NOTE — Progress Notes (Signed)
Pt placed on Auto CPAP 8-15 CMH20 via nasal mask.  Pt tolerating well at this time, RT to monitor and assess as needed.

## 2014-11-02 NOTE — Progress Notes (Signed)
Pt profile: 64 y.o. female with PMH of hypertension, hyperlipidemia, diabetes mellitus, GERD, gout, coronary artery disease (post status of stent placement 2006), history of breast cancer (post status of mastectomy 2008), permanent atrial fibrillation on Pradaxa, asthma, OSA on CPAP, chronic kidney disease-stage 3, chronic bilateral leg edema on Lasix and support hose when she uses them, who presented with increased urinary frequency and right flank pain and SOB.  Mild troponin -this is not felt to be NSTEMI.  Pt is noted to have new cardiomyopathy.  Now EF 25% down from previous 50-55%.  No eval of valves on echo- needs complete Echo- was done but not in correct system to be adjusted.     Subjective: No chest pain, no SOB no further abd pain  Objective: Vital signs in last 24 hours: Temp:  [99.1 F (37.3 C)-100.2 F (37.9 C)] 100.2 F (37.9 C) (06/17 0524) Pulse Rate:  [88-94] 91 (06/17 0524) Resp:  [20] 20 (06/17 0524) BP: (113-127)/(52-91) 113/52 mmHg (06/17 0524) SpO2:  [96 %-98 %] 98 % (06/17 0524) Weight change:  Last BM Date: 10/31/14 Intake/Output from previous day:  Since admit +975 06/16 0701 - 06/17 0700 In: 1875 [I.V.:1725; IV Piggyback:150] Out: 650 [Urine:650] Intake/Output this shift:    PE: General:Pleasant affect, NAD Skin:Warm and dry, brisk capillary refill HEENT:normocephalic, sclera clear, mucus membranes moist Heart:S1S2 RRR without murmur, gallup, rub or click Lungs:clear without rales, rhonchi, or wheezes [id number], non tender, + BS, do not palpate liver spleen or masses Ext:no lower ext edema, 2+ pedal pulses, 2+ radial pulses Neuro:alert and oriented X 3, MAE, follows commands, + facial symmetry Tele: afib with RVR at times to 120   Lab Results:  Recent Labs  10/31/14 1526 11/01/14 0253  WBC 10.2 9.2  HGB 10.9* 9.9*  HCT 32.8* 29.8*  PLT 160 137*   BMET  Recent Labs  11/01/14 0253 11/02/14 0355  NA 139 139  K 2.9* 4.0  CL 101  106  CO2 27 25  GLUCOSE 106* 125*  BUN 37* 32*  CREATININE 1.62* 1.36*  CALCIUM 8.4* 8.8*    Recent Labs  11/01/14 0253 11/01/14 0900  TROPONINI 0.03 0.04*    Lab Results  Component Value Date   CHOL 114 07/13/2011   HDL 33* 07/13/2011   LDLCALC 65 07/13/2011   TRIG 78 07/13/2011   CHOLHDL 3.5 07/13/2011   Lab Results  Component Value Date   HGBA1C 6.8* 11/01/2014     Lab Results  Component Value Date   TSH 1.865 07/13/2011    Hepatic Function Panel  Recent Labs  11/02/14 0355  PROT 6.6  ALBUMIN 2.9*  AST 28  ALT 43  ALKPHOS 103  BILITOT 1.6*  BILIDIR 0.7*  IBILI 0.9   No results for input(s): CHOL in the last 72 hours. No results for input(s): PROTIME in the last 72 hours.     Studies/Results: US Abdomen Complete  11/01/2014   CLINICAL DATA:  Abnormal liver function tests. Chronic kidney disease.  EXAM: ULTRASOUND ABDOMEN COMPLETE  COMPARISON:  Chest radiographs 10/28/2014. No previous cross-sectional imaging of the abdomen.  FINDINGS: Gallbladder: Dependently layering biliary sludge. No stones. Normal gallbladder wall at 3 mm. No sonographic Murphy sign.  Common bile duct: Diameter: 6 mm, within normal limits for age.  Liver: No focal lesion identified. Within normal limits in parenchymal echogenicity. Enlarged hepatic veins, likely related to volume status or cardiac output.  IVC: No abnormality visualized.  Pancreas: Visualized portion unremarkable.  Spleen: Size within normal limits. Echogenic mass is present within the spleen measuring 27 mm x 27 mm by 24 mm. This is nonspecific but commonly these represent hemangioma.  Right Kidney: Length: 12.0 cm. Echogenic cortex. No hydronephrosis.  Left Kidney: Length: 11.9 cm. Echogenicity within normal limits. No mass or hydronephrosis visualized.  Abdominal aorta: No aneurysm visualized.  Other findings: None.  IMPRESSION: 1. Biliary sludge.  No stones. 2. Medical renal disease of the RIGHT kidney. LEFT kidney  appears normal. 3. Hyperechoic lesion within the spleen, nonspecific but most likely representing hemangioma. 4. Enlarged hepatic veins and inferior vena cava, likely related to volume status/CHF when compared to chest radiograph 10/28/2014.   Electronically Signed   By: Dereck Ligas M.D.   On: 11/01/2014 11:53    Medications: I have reviewed the patient's current medications. Scheduled Meds: . albuterol  2.5 mg Nebulization BID  . amLODipine  5 mg Oral Daily  . cefTRIAXone (ROCEPHIN)  IV  1 g Intravenous Q24H  . cholecalciferol  2,000 Units Oral Daily  . dabigatran  150 mg Oral Q12H  . ezetimibe  10 mg Oral Daily  . feeding supplement (ENSURE ENLIVE)  237 mL Oral BID BM  . ferrous sulfate  325 mg Oral Q breakfast  . fluticasone  2 spray Each Nare Daily  . insulin aspart  0-9 Units Subcutaneous TID WC  . isosorbide mononitrate  60 mg Oral Daily  . LORazepam  1 mg Oral QHS  . multivitamin with minerals  1 tablet Oral Daily  . nebivolol  5 mg Oral Daily  . pantoprazole  40 mg Oral Daily  . sodium chloride  3 mL Intravenous Q12H   Continuous Infusions: . sodium chloride 75 mL/hr at 11/01/14 0903   PRN Meds:.allopurinol, alum & mag hydroxide-simeth, colchicine, hydrALAZINE, HYDROcodone-acetaminophen, morphine injection, nitroGLYCERIN, ondansetron **OR** ondansetron (ZOFRAN) IV  Assessment/Plan: Principal Problem:  Pyelonephritis, abd U/Swith sludge, rt kidney with medical disease  Active Problems:   Cardiomyopathy- new from 2013- possible from tachycardia she has with her a fib, though with CAD would need to rule out ischemia- could do as outpt.   Hyperlipidemia- on zetia followed by PCP  Sleep apnea, on C-Pap  Diabetes mellitus- per Im  Essential hypertension-controlled  CAD S/P percutaneous coronary angioplasty- no chest pain, mild troponin bump not felt to be significant.    Atrial fibrillation, permanent- mostly rate controlled, elevated with pain, though over last  several hour with rates up to 120- will increase bystolic to 10 mg.   Edema of both legs- none today  Severe obesity (BMI >= 40)  Hypokalemia- received IV runs- now 4.0and mag 2.2  Acute renal failure superimposed on stage 3 chronic kidney disease, improved now cr 1.36  Abnormal liver function   Anemia- h/h 9.9/29.8  1. Abnormal troponin, no chest pain may be with mild HF, no acute changes with EKG.  2. permanent a fib on pradaxa CHA2DS2Vasc score 5 continue anticoagulation rate may not have been as controlled as thought. Increase bystolic- leading to decrease in EF.   3. Cardiomyopathy -do full echo hx of MR- I have called echo- complete echo was done and it is in system, will be read.  Pt stable may do outpt nuc. Now increasing dose of bystolic, will add arb but will need to monitor Cr closely. Also will decrease norvasc.   ADDENDUM:  Full echo with Ef 25-30% , severe TR and mod MR peak PA pressure 72 mmHg  LOS: 2 days  Time spent with pt. :15 minutes. [facility] R  Nurse Practitioner Certified Pager [phone] or after 5pm and on weekends call [phone] 11/02/2014, 7:45 AM  Patient seen with NP, agree with the above note.  Patient presented with ?pyelonephritis.  No chest pain or dyspnea. Very mildly elevated troponin may be due to mild volume overload.  Echo showed EF down to 25-30%.   - She needs stress test to assess for new ischemia.  Can do as outpatient, no chest pain.  - Would resume home Lasix.  - Continue Bystolic (increased to 10 mg daily for better rate control).  - Will add low dose lisinopril 2.5 mg daily, will need close followup of BMET.   Can go home today if stable from hospitalist's standpoint.  Will need followup with Dr Ellyn Hack, BMET in 1 week, and outpatient Cardiolite.   Loralie Champagne 11/02/2014 3:58 PM

## 2014-11-02 NOTE — Progress Notes (Signed)
Pt lost IV access, 2 attempts made by IV team, and one other attempt was made with no luck. Notified Dr. Candiss Norse, he  wants to hold off on restarting IV, pt may possibly discharge today.  Rosine Beat, RN

## 2014-11-03 DIAGNOSIS — R600 Localized edema: Secondary | ICD-10-CM

## 2014-11-03 LAB — GLUCOSE, CAPILLARY
Glucose-Capillary: 119 mg/dL — ABNORMAL HIGH (ref 65–99)
Glucose-Capillary: 124 mg/dL — ABNORMAL HIGH (ref 65–99)

## 2014-11-03 MED ORDER — FUROSEMIDE 20 MG PO TABS: 60.0000 mg | ORAL_TABLET | Freq: Two times a day (BID) | ORAL | Status: DC

## 2014-11-03 MED ORDER — FUROSEMIDE 20 MG PO TABS: ORAL_TABLET | ORAL | Status: DC

## 2014-11-03 MED ORDER — LISINOPRIL 10 MG PO TABS: 10.0000 mg | ORAL_TABLET | Freq: Every day | ORAL | Status: DC

## 2014-11-03 MED ORDER — NEBIVOLOL HCL 10 MG PO TABS: 10.0000 mg | ORAL_TABLET | Freq: Every day | ORAL | Status: DC

## 2014-11-03 MED ORDER — HYDROCODONE-ACETAMINOPHEN 5-325 MG PO TABS: 1.0000 | ORAL_TABLET | Freq: Four times a day (QID) | ORAL | Status: DC | PRN

## 2014-11-03 NOTE — Progress Notes (Signed)
Pt profile: 64 y.o. female with PMH of hypertension, hyperlipidemia, diabetes mellitus, GERD, gout, coronary artery disease (post status of stent placement 2006), history of breast cancer (post status of mastectomy 2008), permanent atrial fibrillation on Pradaxa, asthma, OSA on CPAP, chronic kidney disease-stage 3, chronic bilateral leg edema on Lasix and support hose when she uses them, who presented with increased urinary frequency and right flank pain and SOB.  Mild troponin -this is not felt to be NSTEMI.  Pt is noted to have new cardiomyopathy.  Now EF 25% down from previous 50-55%.  Mod MR.  Echo- was done but not in correct system to be adjusted.     Subjective: No chest pain, no SOB looks good, ready to go.   Objective: Vital signs in last 24 hours: Temp:  [98.6 F (37 C)-99.3 F (37.4 C)] 99.1 F (37.3 C) (06/18 0652) Pulse Rate:  [77-103] 103 (06/18 0652) Resp:  [20] 20 (06/18 0652) BP: (108-132)/(65-95) 132/95 mmHg (06/18 0652) SpO2:  [95 %-100 %] 100 % (06/18 [id number]) Weight change:  Last BM Date: 11/01/14 Intake/Output from previous day:  Since admit +975 06/17 0701 - 06/18 0700 In: 720 [P.O.:720] Out: -  Intake/Output this shift:    PE: General:Pleasant affect, NAD Skin:Warm and dry, brisk capillary refill HEENT:normocephalic, sclera clear, mucus membranes moist Heart:S1S2 RRR without murmur, gallup, rub or click Lungs:clear without rales, rhonchi, mild left  wheezes [id number], non tender, + BS, do not palpate liver spleen or masses Ext:no lower ext edema, 2+ pedal pulses, 2+ radial pulses Neuro:alert and oriented X 3, MAE, follows commands, + facial symmetry Tele: afib with RVR at times to 120   Lab Results:  Recent Labs  10/31/14 1526 11/01/14 0253  WBC 10.2 9.2  HGB 10.9* 9.9*  HCT 32.8* 29.8*  PLT 160 137*   BMET  Recent Labs  11/01/14 0253 11/02/14 0355  NA 139 139  K 2.9* 4.0  CL 101 106  CO2 27 25  GLUCOSE 106* 125*  BUN 37* 32*    CREATININE 1.62* 1.36*  CALCIUM 8.4* 8.8*    Recent Labs  11/01/14 0253 11/01/14 0900  TROPONINI 0.03 0.04*    Lab Results  Component Value Date   CHOL 114 07/13/2011   HDL 33* 07/13/2011   LDLCALC 65 07/13/2011   TRIG 78 07/13/2011   CHOLHDL 3.5 07/13/2011   Lab Results  Component Value Date   HGBA1C 6.8* 11/01/2014     Lab Results  Component Value Date   TSH 1.865 07/13/2011    Hepatic Function Panel  Recent Labs  11/02/14 0355  PROT 6.6  ALBUMIN 2.9*  AST 28  ALT 43  ALKPHOS 103  BILITOT 1.6*  BILIDIR 0.7*  IBILI 0.9       Studies/Results: US Abdomen Complete  11/01/2014   CLINICAL DATA:  Abnormal liver function tests. Chronic kidney disease.  EXAM: ULTRASOUND ABDOMEN COMPLETE  COMPARISON:  Chest radiographs 10/28/2014. No previous cross-sectional imaging of the abdomen.  FINDINGS: Gallbladder: Dependently layering biliary sludge. No stones. Normal gallbladder wall at 3 mm. No sonographic Murphy sign.  Common bile duct: Diameter: 6 mm, within normal limits for age.  Liver: No focal lesion identified. Within normal limits in parenchymal echogenicity. Enlarged hepatic veins, likely related to volume status or cardiac output.  IVC: No abnormality visualized.  Pancreas: Visualized portion unremarkable.  Spleen: Size within normal limits. Echogenic mass is present within the spleen measuring 27 mm x 27 mm by 24 mm.  This is nonspecific but commonly these represent hemangioma.  Right Kidney: Length: 12.0 cm. Echogenic cortex. No hydronephrosis.  Left Kidney: Length: 11.9 cm. Echogenicity within normal limits. No mass or hydronephrosis visualized.  Abdominal aorta: No aneurysm visualized.  Other findings: None.  IMPRESSION: 1. Biliary sludge.  No stones. 2. Medical renal disease of the RIGHT kidney. LEFT kidney appears normal. 3. Hyperechoic lesion within the spleen, nonspecific but most likely representing hemangioma. 4. Enlarged hepatic veins and inferior vena cava,  likely related to volume status/CHF when compared to chest radiograph 10/28/2014.   Electronically Signed   By: Dereck Ligas M.D.   On: 11/01/2014 11:53    Medications: I have reviewed the patient's current medications. Scheduled Meds: . albuterol  2.5 mg Nebulization BID  . cholecalciferol  2,000 Units Oral Daily  . dabigatran  150 mg Oral Q12H  . ezetimibe  10 mg Oral Daily  . feeding supplement (ENSURE ENLIVE)  237 mL Oral BID BM  . ferrous sulfate  325 mg Oral Q breakfast  . fluticasone  2 spray Each Nare Daily  . heparin lock flush  500 Units Intracatheter Q30 days  . insulin aspart  0-9 Units Subcutaneous TID WC  . isosorbide mononitrate  60 mg Oral Daily  . lisinopril  2.5 mg Oral Daily  . LORazepam  1 mg Oral QHS  . multivitamin with minerals  1 tablet Oral Daily  . nebivolol  10 mg Oral Daily  . pantoprazole  40 mg Oral Daily  . sodium chloride  3 mL Intravenous Q12H   Continuous Infusions:   PRN Meds:.allopurinol, alum & mag hydroxide-simeth, colchicine, heparin lock flush **AND** heparin lock flush, hydrALAZINE, HYDROcodone-acetaminophen, morphine injection, nitroGLYCERIN, ondansetron **OR** ondansetron (ZOFRAN) IV  Assessment/Plan: Principal Problem:  Pyelonephritis, abd U/Swith sludge, rt kidney with medical disease  Active Problems:   Cardiomyopathy- new from 2013- possible from tachycardia she has with her a fib, though with CAD would need to rule out ischemia- could do as outpt.   Hyperlipidemia- on zetia followed by PCP  Sleep apnea, on C-Pap  Diabetes mellitus- per Im  Essential hypertension-controlled  CAD S/P percutaneous coronary angioplasty- no chest pain, mild troponin bump not felt to be significant.    Atrial fibrillation, permanent- mostly rate controlled, elevated with pain, though over last several hour with rates up to 120- will increase bystolic to 10 mg.   Edema of both legs- none today  Severe obesity (BMI >= 40)  Hypokalemia-  received IV runs- now 4.0and mag 2.2  Acute renal failure superimposed on stage 3 chronic kidney disease, improved now cr 1.36  Abnormal liver function   Anemia- h/h 9.9/29.8  1. Abnormal troponin, no chest pain may be with mild HF, no acute changes with EKG.  2. permanent a fib on pradaxa CHA2DS2Vasc score 5 continue anticoagulation rate may not have been as controlled as thought. Increase bystolic- leading to decrease in EF.   3. Cardiomyopathy -  Pt stable may do outpt nuc. Now increasing dose of bystolic, Added ACE-I but will need to monitor Cr closely. Also atopped norvasc.   ADDENDUM:  Full echo with Ef 25-30% , severe TR and mod MR peak PA pressure 72 mmHg  LOS: 3 days    Patient presented with ?pyelonephritis.  No chest pain or dyspnea. Very mildly elevated troponin may be due to mild volume overload.  Echo showed EF down to 25-30%.   - She needs stress test to assess for new ischemia.  Can do  as outpatient, no chest pain.  - Would resume home Lasix but at 60 BID (discussed with Dr. Candiss Norse).  - Continue Bystolic (increased to 10 mg daily for better rate control).  - Added low dose lisinopril 2.5 mg daily, will need close followup of BMET.   Can go home today if stable from hospitalist's standpoint.  Will need followup with Dr Ellyn Hack, BMET in 1 week, and outpatient Cardiolite.   SKAINS, [place] 11/03/2014 8:04 AM

## 2014-11-03 NOTE — Discharge Summary (Signed)
Stephanie Franco, is a 64 y.o. female  DOB [birth year]  MRN [id number].  Admission date:  10/31/2014  Admitting Physician  Ivor Costa, MD  Discharge Date:  11/03/2014   Primary MD  Maximino Greenland, MD  Recommendations for primary care physician for things to follow:   Monitor weight, diuretic and ACE dose along with BMP cloasely   Admission Diagnosis  Hypokalemia [E87.6] CKD (chronic kidney disease), stage III [N18.3]   Discharge Diagnosis  Hypokalemia [E87.6] CKD (chronic kidney disease), stage III [N18.3]     Principal Problem:   Pyelonephritis Active Problems:   Essential hypertension   Hyperlipidemia   Sleep apnea, on C-Pap   Diabetes mellitus   CAD S/P percutaneous coronary angioplasty   Atrial fibrillation, permanent   Edema of both legs   Severe obesity (BMI >= 40)   Hypokalemia   Acute renal failure superimposed on stage 3 chronic kidney disease   Abnormal liver function   Chronic systolic CHF (congestive heart failure)      Past Medical History  Diagnosis Date  . CAD S/P percutaneous coronary angioplasty 2006;     PCI of circumflex with Taxus DES;; relook-cath Feb 2013: 50-60% short lesion in RCA, 40% ISR Circumflex stent.  . Stented coronary artery     40% ISR in Circumflex stent  . Hypertension   . Hyperlipidemia   . Morbid obesity     BMI 41  . Breast cancer 2008    S/P mastectomy  . Atrial fibrillation, permanent   . Asthma   . 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 OSA on CPAP     On CPAP  . Gout   . Diabetes mellitus, uncontrolled 07/14/2011  . History of echocardiogram 07/22/2011  . Edema of both legs     Usually mild, chronic. Controlled with when necessary furosemide and diet  . CKD (chronic kidney disease), stage III     Past Surgical History  Procedure  Laterality Date  . Coronary angioplasty with stent placement  2006    stent to circumflex  . Cardiac catheterization  07/13/11    normal EF; 75mm 50-60% lesion in RCA; patent stent in circumflex form 2006 with ~40% in-stent re-stenosis  . Mastectomy  2008    left  . Transthoracic echocardiogram  March 2013    LV borderline dilated; borderline concentric LVH; EF 99991111; RV sysolic function mildly reduced; LA severely dilated; RA mod-severely dilated; mild-mod MR; mild TR; mod pulm HTN;   . Left heart catheterization with coronary angiogram N/A 07/13/2011    Procedure: LEFT HEART CATHETERIZATION WITH CORONARY ANGIOGRAM;  Surgeon: Leonie Man, MD;  Location: [facility] CATH LAB;  Service: Cardiovascular;  Laterality: N/A;       HPI  from the history and physical done on the day of admission:   Stephanie Franco is a 64 y.o. female with PMH of hypertension, hyperlipidemia, diabetes mellitus, GERD, gout, coronary artery disease (post status of stent placement 2006), history of breast cancer (post status  of mastectomy 2008), atrial fibrillation on Pradaxa, asthma, OSA on CPAP, chronic kidney disease-stage 3, chronic bilateral leg edema on Lasix, who presents with increased urinary frequency and right flank pain.  Patient reports that she has been having increased urinary frequency and right flank pain for 3 days. She had negative urinalysis on 10/28/14, but urine culture showed 40,000 Escherichia coli which are pan-sensitive. She also reports nausea, but no vomiting, abdominal pain or diarrhea. No hematuria. Initially she reported to ED that she has shortness of breath, which has resolved when I saw patient. Currently she does not have chest pain, cough, shortness breath.   In ED, patient was found to have WBC 10.2, negative lipase, no tachycardia, temperature normal, potassium 2.6, mild AoCKD-III, abnormal liver function test with AST 45, ALT 65, total bilirubin 3.5. Patient is admitted to inpatient for  further evaluation and treatment.       Hospital Course:    1. Suprapubic pain. Likely secondary to constipation and UTI, patient says she had had no bowel movements for the last 3 days. Pain much improved after BM . Currently symptom-free. There is question of some low-grade Escherichia coli infection per last cultures. She's been treated with Rocephin will give at least 3 doses since it has been started. Abdominal ultrasound on acute. Pain completely resolved for discharge home.   2. Chronic atrial fibrillation [place] VASC score 3 - on Bystolic along with Pradaxa, continue. Goal will be rate control. Seen by cardiology here.    3. Mildly elevated troponin with left bundle branch block, cannot rule out aberrant conduction with underlying A. fib. Trop rise in NON ACS pattern, continue beta blocker along with Pradaxa. He was completely pain free, since her EF had dropped to 25% cardiology was called, medications were adjusted, cardiology recommended outpatient follow-up for a possible chemical stress test.    4. Elevated total bilirubin with high direct bilirubin. Other liver enzymes are essentially stable, abdominal ultrasound stable, HIV and hepatitis panel negative. She is pain-free in the right upper quadrant. Trend improving. Will have her follow with PCP and GI one time post discharge.    5.Chr Systolic CHF EF 123456 - was dehydrated initially with acute renal failure, had to be hydrated, placed on Bystolic, ACE inhibitor dose adjusted from ARB, diuretic dose adjusted all under the guidance of cardiology, renal function has improved, will be discharged home with written instructions on fluids RESTRICTION with close outpatient cardiology follow-up.    6.ARF - resolved after hydration. Request PCP to monitor weight, BMP and diuretic dose closely.     Discharge Condition: Stable  Follow UP  Follow-up Information    Follow up with SANDERS,ROBYN N, MD. Schedule an appointment as soon  as possible for a visit in 3 days.   Specialty:  Internal Medicine   Contact information:   [address] [place] [address] [phone]       Follow up with Milus Banister, MD. Schedule an appointment as soon as possible for a visit in 1 week.   Specialty:  Gastroenterology   Why:  elevated Bilirubin   Contact information:   [address] [place] [address] [phone]       Follow up with Leonie Man, MD. Schedule an appointment as soon as possible for a visit in 1 week.   Specialty:  Cardiology   Why:  Afib-CHF   Contact information:   [address] [place] [address] [phone]  Consults obtained - Cards  Diet and Activity recommendation: See Discharge Instructions below  Discharge Instructions           Discharge Instructions    Discharge instructions    Complete by:  As directed   Follow with Primary MD Maximino Greenland, MD in 3 days   Get CBC, CMP, 2 view Chest X ray checked  by Primary MD next visit.    Activity: As tolerated with Full fall precautions use walker/cane & assistance as needed   Disposition Home     Diet: Heart Healthy Low Carb.Check your Weight same time everyday, if you gain over 2 pounds, or you develop in leg swelling, experience more shortness of breath or chest pain, call your Primary MD immediately. Follow Cardiac Low Salt Diet and 1.5 lit/day fluid restriction.   On your next visit with your primary care physician please Get Medicines reviewed and adjusted.   Please request your Prim.MD to go over all Hospital Tests and Procedure/Radiological results at the follow up, please get all Hospital records sent to your Prim MD by signing hospital release before you go home.   If you experience worsening of your admission symptoms, develop shortness of breath, life threatening emergency, suicidal or homicidal thoughts you must seek medical attention immediately by calling 911 or  calling your MD immediately  if symptoms less severe.  You Must read complete instructions/literature along with all the possible adverse reactions/side effects for all the Medicines you take and that have been prescribed to you. Take any new Medicines after you have completely understood and accpet all the possible adverse reactions/side effects.   Do not drive, operating heavy machinery, perform activities at heights, swimming or participation in water activities or provide baby sitting services if your were admitted for syncope or siezures until you have seen by Primary MD or a Neurologist and advised to do so again.  Do not drive when taking Pain medications.    Do not take more than prescribed Pain, Sleep and Anxiety Medications  Special Instructions: If you have smoked or chewed Tobacco  in the last 2 yrs please stop smoking, stop any regular Alcohol  and or any Recreational drug use.  Wear Seat belts while driving.   Please note  You were cared for by a hospitalist during your hospital stay. If you have any questions about your discharge medications or the care you received while you were in the hospital after you are discharged, you can call the unit and asked to speak with the hospitalist on call if the hospitalist that took care of you is not available. Once you are discharged, your primary care physician will handle any further medical issues. Please note that NO REFILLS for any discharge medications will be authorized once you are discharged, as it is imperative that you return to your primary care physician (or establish a relationship with a primary care physician if you do not have one) for your aftercare needs so that they can reassess your need for medications and monitor your lab values.  Dr. Allison Quarry office will call you to schedule stress test at his office. To have lab work the lab work should be done next week on Thursday on first floor of Dr. Allison Quarry office   We will  schedule you a follow up but it will be at the [address] office just to see you sooner.    Weigh daily Call [phone] if weight climbs more than 3 pounds in a day or  5 pounds in a week. No salt to very little salt in your diet.  No more than 2000 mg in a day. Call if increased shortness of breath or increased swelling.     Increase activity slowly    Complete by:  As directed              Discharge Medications       Medication List    STOP taking these medications        valsartan-hydrochlorothiazide 320-25 MG per tablet  Commonly known as:  DIOVAN-HCT      TAKE these medications        allopurinol 300 MG tablet  Commonly known as:  ZYLOPRIM  Take 300 mg by mouth daily as needed (for gout flares). Gout     cholecalciferol 1000 UNITS tablet  Commonly known as:  VITAMIN D  Take 2,000 Units by mouth daily.     colchicine 0.6 MG tablet  Take 0.6 mg by mouth daily as needed. Take 2 tablets intially, then 1 tablet in 2 hours , Then 1 tablet daily For gout     dabigatran 150 MG Caps capsule  Commonly known as:  PRADAXA  Take 1 capsule (150 mg total) by mouth every 12 (twelve) hours.     ezetimibe 10 MG tablet  Commonly known as:  ZETIA  Take 1 tablet (10 mg total) by mouth daily.     ferrous sulfate 325 (65 FE) MG EC tablet  Take 325 mg by mouth daily with breakfast.     fluticasone 50 MCG/ACT nasal spray  Commonly known as:  FLONASE  Place 2 sprays into the nose as needed for rhinitis.     furosemide 20 MG tablet  Commonly known as:  LASIX  Take 3 tablets (60 mg total) by mouth 2 (two) times daily. Please do not take on Saturday and Sunday.     GLUCOPHAGE 850 MG tablet  Generic drug:  metFORMIN  Take 850 mg by mouth 2 (two) times daily with a meal.     HYDROcodone-acetaminophen 5-325 MG per tablet  Commonly known as:  NORCO/VICODIN  Take 1 tablet by mouth every 6 (six) hours as needed for moderate pain.     isosorbide mononitrate 60 MG 24 hr tablet    Commonly known as:  IMDUR  Take 1 tablet (60 mg total) by mouth daily.     levalbuterol 0.63 MG/3ML nebulizer solution  Commonly known as:  XOPENEX  Take 3 mLs (0.63 mg total) by nebulization every 6 (six) hours as needed for wheezing or shortness of breath.     lisinopril 10 MG tablet  Commonly known as:  PRINIVIL,ZESTRIL  Take 1 tablet (10 mg total) by mouth daily.     LORazepam 1 MG tablet  Commonly known as:  ATIVAN  Take 1 mg by mouth at bedtime.     multivitamin with minerals tablet  Take 1 tablet by mouth daily.     nebivolol 10 MG tablet  Commonly known as:  BYSTOLIC  Take 1 tablet (10 mg total) by mouth daily.     nitroGLYCERIN 0.4 MG SL tablet  Commonly known as:  NITROSTAT  Place 1 tablet (0.4 mg total) under the tongue every 5 (five) minutes as needed for chest pain.     pantoprazole 40 MG tablet  Commonly known as:  PROTONIX  Take 1 tablet (40 mg total) by mouth daily.     potassium chloride SA 20 MEQ tablet  Commonly known  as:  K-DUR,KLOR-CON  Take 1 tablet (20 mEq total) by mouth 2 (two) times daily.        Major procedures and Radiology Reports - PLEASE review detailed and final reports for all details, in brief -   TTE - Impressions: Notes indicate addition definity images but I cannotfind any other echo imagesLV systolic function is severely depressed in 25% range withdiffuse hypokinesisand abnormal septal motion Limited Study    US Abdomen Complete  11/01/2014   CLINICAL DATA:  Abnormal liver function tests. Chronic kidney disease.  EXAM: ULTRASOUND ABDOMEN COMPLETE  COMPARISON:  Chest radiographs 10/28/2014. No previous cross-sectional imaging of the abdomen.  FINDINGS: Gallbladder: Dependently layering biliary sludge. No stones. Normal gallbladder wall at 3 mm. No sonographic Murphy sign.  Common bile duct: Diameter: 6 mm, within normal limits for age.  Liver: No focal lesion identified. Within normal limits in parenchymal echogenicity. Enlarged  hepatic veins, likely related to volume status or cardiac output.  IVC: No abnormality visualized.  Pancreas: Visualized portion unremarkable.  Spleen: Size within normal limits. Echogenic mass is present within the spleen measuring 27 mm x 27 mm by 24 mm. This is nonspecific but commonly these represent hemangioma.  Right Kidney: Length: 12.0 cm. Echogenic cortex. No hydronephrosis.  Left Kidney: Length: 11.9 cm. Echogenicity within normal limits. No mass or hydronephrosis visualized.  Abdominal aorta: No aneurysm visualized.  Other findings: None.  IMPRESSION: 1. Biliary sludge.  No stones. 2. Medical renal disease of the RIGHT kidney. LEFT kidney appears normal. 3. Hyperechoic lesion within the spleen, nonspecific but most likely representing hemangioma. 4. Enlarged hepatic veins and inferior vena cava, likely related to volume status/CHF when compared to chest radiograph 10/28/2014.   Electronically Signed   By: Dereck Ligas M.D.   On: 11/01/2014 11:53   Dg Abd Acute W/chest  10/28/2014   CLINICAL DATA:  Sudden onset of shortness of breath. Low back and low abdominal pain today. History of coronary artery disease, hypertension, asthma and diabetes. Initial encounter.  EXAM: DG ABDOMEN ACUTE W/ 1V CHEST  COMPARISON:  Chest radiographs 06/29/2013 and 05/05/2013.  FINDINGS: Stable cardiomegaly and chronic vascular congestion. No confluent airspace opacity, edema or significant pleural effusion. Previous left mastectomy.  The bowel gas pattern is normal. There is no free intraperitoneal air or suspicious abdominal calcification. Small right pelvic calcification is likely a phlebolith. There are mild degenerative changes within the lumbar spine and both hips. Asymmetric ossification of the left iliolumbar ligament noted.  IMPRESSION: No acute cardiopulmonary or abdominal process demonstrated. Stable cardiomegaly and chronic vascular congestion.   Electronically Signed   By: Richardean Sale M.D.   On: 10/28/2014  15:08    Micro Results      Recent Results (from the past 240 hour(s))  Urine culture     Status: None   Collection Time: 10/28/14  2:40 PM  Result Value Ref Range Status   Specimen Description URINE, CATHETERIZED  Final   Special Requests NONE  Final   Colony Count   Final    40,000 COLONIES/ML Performed at [facility]    Culture   Final    ESCHERICHIA COLI ENTEROBACTER Cool Performed at [facility]    Report Status 10/31/2014 FINAL  Final   Organism ID, Bacteria ESCHERICHIA COLI  Final   Organism ID, Bacteria ENTEROBACTER AEROGENES  Final   Organism ID, Bacteria PROTEUS MIRABILIS  Final      Susceptibility   Enterobacter aerogenes - MIC*  CEFAZOLIN RESISTANT      CEFTRIAXONE <=1 SENSITIVE Sensitive     CIPROFLOXACIN <=0.25 SENSITIVE Sensitive     GENTAMICIN <=1 SENSITIVE Sensitive     LEVOFLOXACIN <=0.12 SENSITIVE Sensitive     NITROFURANTOIN 64 INTERMEDIATE Intermediate     TOBRAMYCIN <=1 SENSITIVE Sensitive     TRIMETH/SULFA <=20 SENSITIVE Sensitive     PIP/TAZO <=4 SENSITIVE Sensitive     * ENTEROBACTER AEROGENES   Escherichia coli - MIC*    AMPICILLIN <=2 SENSITIVE Sensitive     CEFAZOLIN <=4 SENSITIVE Sensitive     CEFTRIAXONE <=1 SENSITIVE Sensitive     CIPROFLOXACIN <=0.25 SENSITIVE Sensitive     GENTAMICIN <=1 SENSITIVE Sensitive     LEVOFLOXACIN <=0.12 SENSITIVE Sensitive     NITROFURANTOIN <=16 SENSITIVE Sensitive     TOBRAMYCIN <=1 SENSITIVE Sensitive     TRIMETH/SULFA <=20 SENSITIVE Sensitive     PIP/TAZO <=4 SENSITIVE Sensitive     * ESCHERICHIA COLI   Proteus mirabilis - MIC*    AMPICILLIN <=2 SENSITIVE Sensitive     CEFAZOLIN <=4 SENSITIVE Sensitive     CEFTRIAXONE <=1 SENSITIVE Sensitive     CIPROFLOXACIN <=0.25 SENSITIVE Sensitive     GENTAMICIN <=1 SENSITIVE Sensitive     LEVOFLOXACIN <=0.12 SENSITIVE Sensitive     NITROFURANTOIN 128 RESISTANT Resistant     TOBRAMYCIN <=1 SENSITIVE Sensitive      TRIMETH/SULFA <=20 SENSITIVE Sensitive     PIP/TAZO <=4 SENSITIVE Sensitive     * PROTEUS MIRABILIS  Culture, blood (x 2)     Status: None (Preliminary result)   Collection Time: 10/31/14  9:34 PM  Result Value Ref Range Status   Specimen Description BLOOD RIGHT ARM  Final   Special Requests BOTTLES DRAWN AEROBIC AND ANAEROBIC 5ML  Final   Culture   Final    NO GROWTH 1 DAY Performed at [facility]    Report Status PENDING  Incomplete  Culture, blood (x 2)     Status: None (Preliminary result)   Collection Time: 10/31/14  9:40 PM  Result Value Ref Range Status   Specimen Description BLOOD RIGHT ARM  Final   Special Requests BOTTLES DRAWN AEROBIC AND ANAEROBIC 5ML  Final   Culture   Final    NO GROWTH 1 DAY Performed at [facility]    Report Status PENDING  Incomplete  Urine culture     Status: None   Collection Time: 10/31/14 10:04 PM  Result Value Ref Range Status   Specimen Description URINE, CLEAN CATCH  Final   Special Requests NONE  Final   Culture   Final    MULTIPLE SPECIES PRESENT, SUGGEST RECOLLECTION IF CLINICALLY INDICATED Performed at [facility]    Report Status 11/02/2014 FINAL  Final       Today   Subjective    Stephanie Franco today has no headache,no chest abdominal pain,no new weakness tingling or numbness, feels much better wants to go home today.     Objective   Blood pressure 132/95, pulse 103, temperature 99.1 F (37.3 C), temperature source Oral, resp. rate 20, height 5\' 3"  (1.6 m), weight 103.465 kg (228 lb 1.6 oz), SpO2 100 %.   Intake/Output Summary (Last 24 hours) at 11/03/14 0817 Last data filed at 11/02/14 1457  Gross per 24 hour  Intake    720 ml  Output      0 ml  Net    720 ml    Exam Awake Alert, Oriented  x 3, No new F.N deficits, Normal affect [place].AT,PERRAL Supple Neck,No JVD, No cervical lymphadenopathy appriciated.  Symmetrical Chest wall movement, Good air movement bilaterally, CTAB RRR,No  Gallops,Rubs or new Murmurs, No Parasternal Heave +ve B.Sounds, Abd Soft, Non tender, No organomegaly appriciated, No rebound -guarding or rigidity. No Cyanosis, Clubbing or edema, No new Rash or bruise   Data Review   CBC w Diff:  Lab Results  Component Value Date   WBC 9.2 11/01/2014   HGB 9.9* 11/01/2014   HCT 29.8* 11/01/2014   PLT 137* 11/01/2014   LYMPHOPCT 11* 10/31/2014   MONOPCT 9 10/31/2014   EOSPCT 0 10/31/2014   BASOPCT 0 10/31/2014    CMP:  Lab Results  Component Value Date   NA 139 11/02/2014   K 4.0 11/02/2014   CL 106 11/02/2014   CO2 25 11/02/2014   BUN 32* 11/02/2014   CREATININE 1.36* 11/02/2014   CREATININE 1.10 06/22/2013   PROT 6.6 11/02/2014   ALBUMIN 2.9* 11/02/2014   BILITOT 1.6* 11/02/2014   ALKPHOS 103 11/02/2014   AST 28 11/02/2014   ALT 43 11/02/2014  . Lab Results  Component Value Date   HGBA1C 6.8* 11/01/2014     Total Time in preparing paper work, data evaluation and todays exam - 35 minutes  Thurnell Lose M.D on 11/03/2014 at 8:17 AM  [facility]   Office  [phone]

## 2014-11-05 ENCOUNTER — Telehealth (HOSPITAL_COMMUNITY): Payer: Self-pay | Admitting: *Deleted

## 2014-11-06 LAB — CULTURE, BLOOD (ROUTINE X 2)
CULTURE: NO GROWTH
Culture: NO GROWTH

## 2014-11-08 ENCOUNTER — Telehealth: Payer: Self-pay | Admitting: Cardiology

## 2014-11-08 DIAGNOSIS — I4821 Permanent atrial fibrillation: Secondary | ICD-10-CM

## 2014-11-08 MED ORDER — DABIGATRAN ETEXILATE MESYLATE 150 MG PO CAPS: 150.0000 mg | ORAL_CAPSULE | Freq: Two times a day (BID) | ORAL | Status: DC

## 2014-11-08 NOTE — Telephone Encounter (Signed)
°  1. Which medications need to be refilled? Pradaxa  2. Which pharmacy is medication to be sent to?[phone]  3. Do they need a 30 day or 90 day supply? #180 and refills  4. Would they like a call back once the medication has been sent to the pharmacy? Yes

## 2014-11-08 NOTE — Telephone Encounter (Signed)
Rx(s) sent to pharmacy electronically. Patient notified. 

## 2014-11-20 ENCOUNTER — Telehealth (HOSPITAL_COMMUNITY): Payer: Self-pay

## 2014-11-20 NOTE — Telephone Encounter (Signed)
Encounter complete. 

## 2014-11-22 ENCOUNTER — Ambulatory Visit (HOSPITAL_COMMUNITY)
Admission: RE | Admit: 2014-11-22 | Discharge: 2014-11-22 | Disposition: A | Discharge status: Home / Self Care | Payer: Medicare Other | Source: Ambulatory Visit | Attending: Cardiology | Admitting: Cardiology

## 2014-11-22 DIAGNOSIS — J45909 Unspecified asthma, uncomplicated: Secondary | ICD-10-CM | POA: Insufficient documentation

## 2014-11-22 DIAGNOSIS — R0602 Shortness of breath: Secondary | ICD-10-CM | POA: Insufficient documentation

## 2014-11-22 DIAGNOSIS — I2510 Atherosclerotic heart disease of native coronary artery without angina pectoris: Secondary | ICD-10-CM | POA: Diagnosis not present

## 2014-11-22 DIAGNOSIS — I129 Hypertensive chronic kidney disease with stage 1 through stage 4 chronic kidney disease, or unspecified chronic kidney disease: Secondary | ICD-10-CM | POA: Insufficient documentation

## 2014-11-22 DIAGNOSIS — Z8249 Family history of ischemic heart disease and other diseases of the circulatory system: Secondary | ICD-10-CM | POA: Insufficient documentation

## 2014-11-22 DIAGNOSIS — E669 Obesity, unspecified: Secondary | ICD-10-CM | POA: Diagnosis not present

## 2014-11-22 DIAGNOSIS — Z6841 Body Mass Index (BMI) 40.0 and over, adult: Secondary | ICD-10-CM | POA: Diagnosis not present

## 2014-11-22 DIAGNOSIS — N183 Chronic kidney disease, stage 3 (moderate): Secondary | ICD-10-CM | POA: Diagnosis not present

## 2014-11-22 DIAGNOSIS — Z955 Presence of coronary angioplasty implant and graft: Secondary | ICD-10-CM | POA: Diagnosis not present

## 2014-11-22 DIAGNOSIS — I4891 Unspecified atrial fibrillation: Secondary | ICD-10-CM | POA: Diagnosis not present

## 2014-11-22 DIAGNOSIS — I429 Cardiomyopathy, unspecified: Secondary | ICD-10-CM | POA: Diagnosis not present

## 2014-11-22 DIAGNOSIS — I509 Heart failure, unspecified: Secondary | ICD-10-CM | POA: Diagnosis not present

## 2014-11-22 DIAGNOSIS — G4733 Obstructive sleep apnea (adult) (pediatric): Secondary | ICD-10-CM | POA: Insufficient documentation

## 2014-11-22 HISTORY — PX: NM MYOVIEW LTD: HXRAD82

## 2014-11-22 LAB — MYOCARDIAL PERFUSION IMAGING
CHL CUP NUCLEAR SRS: 4
Peak HR: 76 {beats}/min
Rest HR: 62 {beats}/min
SDS: 3
SSS: 7
TID: 1.31

## 2014-11-22 MED ORDER — TECHNETIUM TC 99M SESTAMIBI GENERIC - CARDIOLITE
32.9000 | Freq: Once | INTRAVENOUS | Status: AC | PRN
  Administered 2014-11-22: 32.9 via INTRAVENOUS

## 2014-11-22 MED ORDER — TECHNETIUM TC 99M SESTAMIBI GENERIC - CARDIOLITE
10.5000 | Freq: Once | INTRAVENOUS | Status: AC | PRN
  Administered 2014-11-22: 11 via INTRAVENOUS

## 2014-11-22 MED ORDER — REGADENOSON 0.4 MG/5ML IV SOLN
0.4000 mg | Freq: Once | INTRAVENOUS | Status: AC
  Administered 2014-11-22: 0.4 mg via INTRAVENOUS

## 2014-11-23 ENCOUNTER — Telehealth: Payer: Self-pay | Admitting: *Deleted

## 2014-11-23 NOTE — Telephone Encounter (Signed)
Spoke to patient. Result given . Verbalized understanding Appointment made for July 29 at 2 pm. Patient she would rather stay with that appointment With Dr Ellyn Hack.

## 2014-11-23 NOTE — Telephone Encounter (Signed)
-----   Message from Isaiah Serge, NP sent at 11/23/2014  6:47 AM EDT ----- Please note stress test without ischemia, echo was done in the hospital with EF 25% she needs follow up with Dr. Roni Bread or APP.

## 2014-12-13 ENCOUNTER — Other Ambulatory Visit: Payer: Self-pay | Admitting: Cardiology

## 2014-12-13 NOTE — Telephone Encounter (Signed)
REFILL 

## 2014-12-14 ENCOUNTER — Ambulatory Visit: Payer: PRIVATE HEALTH INSURANCE | Admitting: Cardiology

## 2015-01-01 ENCOUNTER — Other Ambulatory Visit: Payer: Self-pay | Admitting: Internal Medicine

## 2015-01-11 ENCOUNTER — Other Ambulatory Visit: Payer: Self-pay | Admitting: Cardiology

## 2015-01-11 NOTE — Telephone Encounter (Signed)
Rx(s) sent to pharmacy electronically.  

## 2015-01-17 HISTORY — PX: TRANSTHORACIC ECHOCARDIOGRAM: SHX275

## 2015-01-29 ENCOUNTER — Encounter: Payer: Self-pay | Admitting: Cardiology

## 2015-01-29 ENCOUNTER — Ambulatory Visit (INDEPENDENT_AMBULATORY_CARE_PROVIDER_SITE_OTHER): Payer: Medicare Other | Admitting: Cardiology

## 2015-01-29 VITALS — BP 130/80 | HR 56 | Ht 63.0 in | Wt 231.9 lb

## 2015-01-29 DIAGNOSIS — I420 Dilated cardiomyopathy: Secondary | ICD-10-CM

## 2015-01-29 DIAGNOSIS — Z9861 Coronary angioplasty status: Secondary | ICD-10-CM

## 2015-01-29 DIAGNOSIS — R600 Localized edema: Secondary | ICD-10-CM

## 2015-01-29 DIAGNOSIS — I2510 Atherosclerotic heart disease of native coronary artery without angina pectoris: Secondary | ICD-10-CM

## 2015-01-29 DIAGNOSIS — I10 Essential (primary) hypertension: Secondary | ICD-10-CM | POA: Diagnosis not present

## 2015-01-29 DIAGNOSIS — I5032 Chronic diastolic (congestive) heart failure: Secondary | ICD-10-CM

## 2015-01-29 DIAGNOSIS — I482 Chronic atrial fibrillation: Secondary | ICD-10-CM

## 2015-01-29 DIAGNOSIS — I4821 Permanent atrial fibrillation: Secondary | ICD-10-CM

## 2015-01-29 DIAGNOSIS — G4730 Sleep apnea, unspecified: Secondary | ICD-10-CM

## 2015-01-29 DIAGNOSIS — E785 Hyperlipidemia, unspecified: Secondary | ICD-10-CM

## 2015-01-29 DIAGNOSIS — R0609 Other forms of dyspnea: Secondary | ICD-10-CM

## 2015-01-29 MED ORDER — NEBIVOLOL HCL 10 MG PO TABS: 10.0000 mg | ORAL_TABLET | Freq: Every day | ORAL | Status: DC

## 2015-01-29 MED ORDER — DABIGATRAN ETEXILATE MESYLATE 150 MG PO CAPS: 150.0000 mg | ORAL_CAPSULE | Freq: Two times a day (BID) | ORAL | Status: DC

## 2015-01-29 NOTE — Progress Notes (Signed)
PCP: Kristine Garbe, MD  Clinic Note: Chief Complaint  Patient presents with  . Follow-up    no chest pain, SOB or leg swelling  . Coronary Artery Disease  . Cardiomyopathy  . Atrial Fibrillation    HPI: Stephanie Franco is a 64 y.o. female with a PMH below who presents today for 6 month f/u of CAD-PCI with Afib & post-hospital visit -- Echo with severely reduced EF   Stephanie Franco was last seen in Jan 2016  Recent Hospitalizations: October 31, 2014 --Escherichia coli UTI / concern for pyelonephritis; mild + Troponin - thought to be demand ischemia related Echo EF down to 25% with diffuse HK -- (down from 50-55%) Mod MR --> thought to be related to acute illness.  OP Myoview ordered to assess change in EF.  She never really had an issue with rapid heart rate during hospital stay. She is maintained on Pradaxa and Bystolic for rate control. She was supposed to had her ARB stopped and restarted on ACE inhibitor, however she's been on both ACE inhibitor plus ARB since discharge. Meds were adjusted for her acute renal failure. This got better with hydration initially on discharge she was discharged on 60 mg twice a day furosemide that she was subsequently switched to 80 mg once daily.  Studies Reviewed: (entered into [facility])  Echo June 2015 (while sick with urosepsis)  Impressions: Notes indicate addition definity images but I cannot find any other echo images LV systolic function is severely depressed in 25% range with diffuse hypokinesis and abnormal septal motion Limited Study   Myoview 11/22/2014:  Low risk stress nuclear study with no ischemia or infarction; mild LVE; study not gated due to atrial fibrillation; suggest echocardiogram to assess LV function.  Interval History: Since her hosptial stay , she has been doing quite doing well.  No Afib issues.   While in hospital had PND/orthopnea - but not any more.   NO chest tightness/pressure/discomfort @ rest or exertion.  Back  up & around. Since her discharge, she really has had minimal significant edema.  Just  off & on R> L ankle puffiness (no real edema).  Mostly notes palpitations /? AFib if not feeling good or under stress.  Not really fast unless anxious or racing to do something. No TIA/Amaoursis fugax, syncope/near syncope. No dizziness.    Overall, she says it is gradually getting back to her baseline /premorbid condition. Fracture also states she is doing well her major complaints. No anginal chest that is worse with rest or exertion. No heart failure symptoms of PND, orthopnea or significant edema.   Past Medical History  Diagnosis Date  . CAD S/P percutaneous coronary angioplasty 2006;     PCI of circumflex with Taxus DES;; relook-cath Feb 2013: 50-60% short lesion in RCA, 40% ISR Circumflex stent.  . Stented coronary artery     40% ISR in Circumflex stent  . Hypertension   . Hyperlipidemia   . Morbid obesity     BMI 41  . Breast cancer 2008    S/P mastectomy  . Atrial fibrillation, permanent   . Asthma   . 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 OSA on CPAP     On CPAP  . Gout   . Diabetes mellitus, uncontrolled 07/14/2011  . History of echocardiogram 07/22/2011  . Edema of both legs     Usually mild, chronic. Controlled with when necessary furosemide and diet  . CKD (chronic  kidney disease), stage III   . Dilated cardiomyopathy     Severely depressed LV systolic function with EF of 25% range. Very limited study. Diffuse hypokinesis.    Past Surgical History  Procedure Laterality Date  . Coronary angioplasty with stent placement  2006    stent to circumflex  . Mastectomy  2008    left  . Transthoracic echocardiogram  March 2013    LV borderline dilated; borderline concentric LVH; EF 99991111; RV sysolic function mildly reduced; LA severely dilated; RA mod-severely dilated; mild-mod MR; mild TR; mod pulm HTN;   . Left heart catheterization with coronary angiogram N/A 07/13/2011     Procedure: LEFT HEART CATHETERIZATION WITH CORONARY ANGIOGRAM;  Surgeon: Leonie Man, MD;  Location: [facility] CATH LAB;  Service: Cardiovascular;  normal EF; 52mm 50-60% lesion in RCA; patent stent in circumflex form 2006 with ~40% in-stent re-stenosis  . Transthoracic echocardiogram  June 2015    In setting of Urosepsis: EF ~25 %, global HK (poor quality study)  . Nm myoview ltd  11/22/2014    Low Risk. No ischemia or infarction. Not gated 2/2  A. fib - unable to assess EF    ROS: A comprehensive was performed. Review of Systems  Constitutional: Positive for malaise/fatigue (Gradually getting back to a normal energy level.).  HENT: Negative for nosebleeds.   Respiratory: Negative for cough, sputum production and wheezing.   Cardiovascular: Negative for claudication.  Gastrointestinal: Negative for blood in stool and melena.  Genitourinary: Positive for dysuria (Improving). Negative for hematuria.  Musculoskeletal: Positive for joint pain. Negative for myalgias and falls.  Neurological: Positive for dizziness (getting better now but she has recovered).  Endo/Heme/Allergies: Bruises/bleeds easily.  Psychiatric/Behavioral: Negative for depression.  All other systems reviewed and are negative.   Prior to Admission medications   Medication Sig Start Date End Date Taking? Authorizing Provider  allopurinol (ZYLOPRIM) 300 MG tablet Take 300 mg by mouth daily as needed (for gout flares). Gout   Yes Historical Provider, MD  cholecalciferol (VITAMIN D) 1000 UNITS tablet Take 2,000 Units by mouth daily.   Yes Historical Provider, MD  colchicine 0.6 MG tablet Take 0.6 mg by mouth daily as needed. Take 2 tablets intially, then 1 tablet in 2 hours , Then 1 tablet daily For gout   Yes Historical Provider, MD  dabigatran (PRADAXA) 150 MG CAPS capsule Take 1 capsule (150 mg total) by mouth every 12 (twelve) hours. 11/08/14  Yes Leonie Man, MD  ferrous sulfate 325 (65 FE) MG EC tablet Take 325 mg by  mouth daily with breakfast.   Yes Historical Provider, MD  fluticasone (FLONASE) 50 MCG/ACT nasal spray Place 2 sprays into the nose as needed for rhinitis.   Yes Historical Provider, MD  furosemide (LASIX) 20 MG tablet Take 3 tablets (60 mg total) by mouth 2 (two) times daily. Please do not take on Saturday and Sunday. 11/03/14  Yes Thurnell Lose, MD  glucose blood (ACCU-CHEK SMARTVIEW) test strip 1 each by Other route as needed for other. Use as instructed   Yes Historical Provider, MD  Insulin Glargine (TOUJEO SOLOSTAR) 300 UNIT/ML SOPN Inject 16 Units into the skin every evening.   Yes Historical Provider, MD  isosorbide mononitrate (IMDUR) 60 MG 24 hr tablet Take 1 tablet (60 mg total) by mouth daily. 06/11/14  Yes Leonie Man, MD  levalbuterol Penne Lash) 0.63 MG/3ML nebulizer solution Take 3 mLs (0.63 mg total) by nebulization every 6 (six) hours as needed for  wheezing or shortness of breath. 07/01/13  Yes Bonnielee Haff, MD  lisinopril (PRINIVIL,ZESTRIL) 10 MG tablet Take 1 tablet (10 mg total) by mouth daily. 11/03/14  Yes Thurnell Lose, MD  LORazepam (ATIVAN) 0.5 MG tablet Take 0.5 mg by mouth 2 (two) times daily.   Yes Historical Provider, MD  meloxicam (MOBIC) 15 MG tablet Take 15 mg by mouth daily.   Yes Historical Provider, MD  Multiple Vitamins-Minerals (MULTIVITAMIN WITH MINERALS) tablet Take 1 tablet by mouth daily.   Yes Historical Provider, MD  nebivolol (BYSTOLIC) 10 MG tablet Take 1 tablet (10 mg total) by mouth daily. 01/11/15  Yes Leonie Man, MD  nitroGLYCERIN (NITROSTAT) 0.4 MG SL tablet Place 1 tablet (0.4 mg total) under the tongue every 5 (five) minutes as needed for chest pain. 01/24/14  Yes Leonie Man, MD  pantoprazole (PROTONIX) 40 MG tablet Take 1 tablet (40 mg total) by mouth daily. 06/11/14  Yes Leonie Man, MD  potassium chloride SA (K-DUR,KLOR-CON) 20 MEQ tablet Take 1 tablet (20 mEq total) by mouth 2 (two) times daily. 06/11/14  Yes Leonie Man, MD    valsartan-hydrochlorothiazide (DIOVAN-HCT) 320-25 MG per tablet Take 1 tablet by mouth daily.   Yes Historical Provider, MD  ZETIA 10 MG tablet TAKE 1 TABLET (10 MG TOTAL) BY MOUTH DAILY. 12/13/14  Yes Leonie Man, MD   No Known Allergies   Social History   Social History  . Marital Status: Married    Spouse Name: N/A  . Number of Children: 4  . Years of Education: N/A   Social History Main Topics  . Smoking status: Former Smoker -- 0.50 packs/day for 6 years    Types: Cigarettes    Quit date: 05/18/1992  . Smokeless tobacco: Never Used  . Alcohol Use: No     Comment: 07/13/11 "have drank occasionally; not now"  . Drug Use: No  . Sexual Activity: Not Currently   Other Topics Concern  . None   Social History Narrative   She is a married mother of 66, grandmother 2. Usually accompanied by her husband. She does not work. She had been working on her exercise, but is no longer as active. Does not drink and does not smoke   Family History  Problem Relation Age of Onset  . Coronary artery disease Mother   . Hypertension Mother   . Atrial fibrillation Son      Wt Readings from Last 3 Encounters:  01/29/15 231 lb 14.4 oz (105.189 kg)  11/22/14 240 lb (108.863 kg)  10/31/14 228 lb 1.6 oz (103.465 kg)   PHYSICAL EXAM BP 130/80 mmHg  Pulse 56  Ht 5\' 3"  (1.6 m)  Wt 231 lb 14.4 oz (105.189 kg)  BMI 41.09 kg/m2 General appearance: alert, cooperative, appears stated age, no distress and moderately obese Neck: no adenopathy, no carotid bruit and no JVD Lungs: clear to auscultation bilaterally, normal percussion bilaterally and non-labored Heart: Irregularly irregular rhythm with normal rate; S1, S2 shenormal, no murmur, click, rub or gallop ; nondisplaced PMI Abdomen: soft, non-tender; bowel sounds normal; no masses, no organomegaly; moderate to morbidly obese Extremities: extremities normal, atraumatic, no cyanosis, and edema 1+ bilaterally Pulses: 2+ and symmetric; Skin:  normal  Neurologic: Mental status: Alert, oriented, thought content appropriate Cranial nerves: normal (II-XII grossly intact)    Adult ECG Report - not checked  Other studies Reviewed: Additional studies/ records that were reviewed today include:  Recent Labs:   Lab Results  Component Value Date   CREATININE 1.36* 11/02/2014   Lab Results  Component Value Date   K 4.0 11/02/2014   Lipids - checked by PCP - not available.     ASSESSMENT / PLAN: Problem List Items Addressed This Visit    Atrial fibrillation, permanent (Chronic)    Stable with out any really significant symptoms. No rapid responses with exception of during the hospital stay. Continue beta blocker at current dose for rate control. Tolerating Pradaxa well for anticoagulation without any significant bleeding.      Relevant Medications   valsartan-hydrochlorothiazide (DIOVAN-HCT) 320-25 MG per tablet   dabigatran (PRADAXA) 150 MG CAPS capsule   nebivolol (BYSTOLIC) 10 MG tablet   Other Relevant Orders   ECHOCARDIOGRAM COMPLETE   CAD S/P percutaneous coronary angioplasty - Primary (Chronic)    Nonobstructive disease by cath in 2013.  It does not sound like she is having active anginal symptoms, but did have mild troponin elevation which is consistent with a drop in ejection fraction. Negative Myoview to followup with ECF. She is on a beta blocker, ARB along with Lanoxin nitrate. Since she is on anticoagulation, she is not taking aspirin.      Relevant Medications   valsartan-hydrochlorothiazide (DIOVAN-HCT) 320-25 MG per tablet   dabigatran (PRADAXA) 150 MG CAPS capsule   nebivolol (BYSTOLIC) 10 MG tablet   Other Relevant Orders   ECHOCARDIOGRAM COMPLETE   Chronic diastolic heart failure, NYHA class 2 (Chronic)    Previously she simply had diastolic failure now she has a combination of systolic and diastolic based on echocardiogram results.  She remains on stable medications as discussed and cardiomyopathy  section.      Relevant Medications   valsartan-hydrochlorothiazide (DIOVAN-HCT) 320-25 MG per tablet   dabigatran (PRADAXA) 150 MG CAPS capsule   nebivolol (BYSTOLIC) 10 MG tablet   Congestive dilated cardiomyopathy (Chronic)    Gen. Suction during her hospital stay, was that this was related to her acute illness. Unfortunately the Myoview was not able to assess the S2 to atrial fibrillation and lack of gating. Thankfully there was no signs of any ischemia.  She will need a followup echocardiogram roughly 3 months from her hospital echocardiogram, which should be next week.  Plan: 2-D echocardiogram next week  She is already on beta blocker and ARB with standing dose of Lasix. She has cut back to 80 mg once a day standing dose of Lasix -- with sliding scale Lasix when necessary.      Relevant Medications   valsartan-hydrochlorothiazide (DIOVAN-HCT) 320-25 MG per tablet   dabigatran (PRADAXA) 150 MG CAPS capsule   nebivolol (BYSTOLIC) 10 MG tablet   Other Relevant Orders   ECHOCARDIOGRAM COMPLETE   DOE (dyspnea on exertion)    Back to her stable baseline. It really does not sound like he is having any worsening heart failure symptoms. She just seems a bit weak from her recent hospital stay.      Relevant Orders   ECHOCARDIOGRAM COMPLETE   Edema of both legs (Chronic)    Well-controlled now on current dose of Lasix. We talked about possibly using compression stockings/support hose.      Essential hypertension (Chronic)   Relevant Medications   valsartan-hydrochlorothiazide (DIOVAN-HCT) 320-25 MG per tablet   dabigatran (PRADAXA) 150 MG CAPS capsule   nebivolol (BYSTOLIC) 10 MG tablet   Other Relevant Orders   ECHOCARDIOGRAM COMPLETE   Hyperlipidemia with target LDL less than 70 (Chronic)    No longer on statin. Labs are  being monitored by PCP -- not currently available. Had previously been on Crestor which is not currently listed.she is on Zetia. Has had intolerances of several  statins. If we are unable to get her LDL to goal, we'll need to consider referral to our pharmacy team for PCSK 9 inhibitor Rx.      Relevant Medications   valsartan-hydrochlorothiazide (DIOVAN-HCT) 320-25 MG per tablet   dabigatran (PRADAXA) 150 MG CAPS capsule   nebivolol (BYSTOLIC) 10 MG tablet   Other Relevant Orders   ECHOCARDIOGRAM COMPLETE   Severe obesity (BMI >= 40) (Chronic)    Thankfully, she lost back some of the weight she gained. The patient understands the need to lose weight with diet and exercise. We have discussed specific strategies for this.She may need nutrition counseling. I counseled her on the importance of exercise.      Relevant Medications   Insulin Glargine (TOUJEO SOLOSTAR) 300 UNIT/ML SOPN   Other Relevant Orders   ECHOCARDIOGRAM COMPLETE   Sleep apnea, on C-Pap (Chronic)   Relevant Orders   ECHOCARDIOGRAM COMPLETE     Very complicated the clinic visit to review her hospital stay, echocardiogram and Myoview.  Roughly 30 minutes directly with patient and another 20+ minutes with chart review  Current medicines are reviewed at length with the patient today. (+/- concerns) confusion over having to be on both ACE inhibitor and ARB. The following changes have been made: stop ACE inhibitor Studies Ordered:   Orders Placed This Encounter  Procedures  . ECHOCARDIOGRAM COMPLETE  - Next week (~ 3 months out from hospital)   ROV 6 months (unless Echo remains with low EF)   Zakiya Sporrer, Leonie Green, M.D., M.S. Interventional Cardiologist   Pager # [phone]

## 2015-01-29 NOTE — Patient Instructions (Signed)
DO NOT TAKE LISINOPRIL , BUT CONTINUE TAKING VALSARTAN  Your physician has requested that you have an echocardiogram WEEK  OF SEPT 19 2016. Echocardiography is a painless test that uses sound waves to create images of your heart. It provides your doctor with information about the size and shape of your heart and how well your heart's chambers and valves are working. This procedure takes approximately one hour. There are no restrictions for this procedure.   NO OTHER CHANGES.  Your physician recommends that you schedule a follow-up appointment in [place].

## 2015-01-30 ENCOUNTER — Encounter: Payer: Self-pay | Admitting: Cardiology

## 2015-01-30 NOTE — Assessment & Plan Note (Signed)
Gen. Suction during her hospital stay, was that this was related to her acute illness. Unfortunately the Myoview was not able to assess the S2 to atrial fibrillation and lack of gating. Thankfully there was no signs of any ischemia.  She will need a followup echocardiogram roughly 3 months from her hospital echocardiogram, which should be next week.  Plan: 2-D echocardiogram next week  She is already on beta blocker and ARB with standing dose of Lasix. She has cut back to 80 mg once a day standing dose of Lasix -- with sliding scale Lasix when necessary.

## 2015-01-30 NOTE — Assessment & Plan Note (Signed)
Well-controlled now on current dose of Lasix. We talked about possibly using compression stockings/support hose.

## 2015-01-30 NOTE — Assessment & Plan Note (Signed)
Nonobstructive disease by cath in 2013.  It does not sound like she is having active anginal symptoms, but did have mild troponin elevation which is consistent with a drop in ejection fraction. Negative Myoview to followup with ECF. She is on a beta blocker, ARB along with Lanoxin nitrate. Since she is on anticoagulation, she is not taking aspirin.

## 2015-01-30 NOTE — Assessment & Plan Note (Signed)
Stable with out any really significant symptoms. No rapid responses with exception of during the hospital stay. Continue beta blocker at current dose for rate control. Tolerating Pradaxa well for anticoagulation without any significant bleeding.

## 2015-01-30 NOTE — Assessment & Plan Note (Signed)
Back to her stable baseline. It really does not sound like he is having any worsening heart failure symptoms. She just seems a bit weak from her recent hospital stay.

## 2015-01-30 NOTE — Assessment & Plan Note (Signed)
Thankfully, she lost back some of the weight she gained. The patient understands the need to lose weight with diet and exercise. We have discussed specific strategies for this.She may need nutrition counseling. I counseled her on the importance of exercise.

## 2015-01-30 NOTE — Assessment & Plan Note (Addendum)
No longer on statin. Labs are being monitored by PCP -- not currently available. Had previously been on Crestor which is not currently listed.she is on Zetia. Has had intolerances of several statins. If we are unable to get her LDL to goal, we'll need to consider referral to our pharmacy team for PCSK 9 inhibitor Rx.

## 2015-01-30 NOTE — Assessment & Plan Note (Signed)
Previously she simply had diastolic failure now she has a combination of systolic and diastolic based on echocardiogram results.  She remains on stable medications as discussed and cardiomyopathy section.

## 2015-02-05 ENCOUNTER — Ambulatory Visit (HOSPITAL_COMMUNITY): Payer: Medicare Other | Attending: Cardiovascular Disease

## 2015-02-05 ENCOUNTER — Other Ambulatory Visit: Payer: Self-pay

## 2015-02-05 DIAGNOSIS — I340 Nonrheumatic mitral (valve) insufficiency: Secondary | ICD-10-CM | POA: Diagnosis not present

## 2015-02-05 DIAGNOSIS — I517 Cardiomegaly: Secondary | ICD-10-CM | POA: Diagnosis not present

## 2015-02-05 DIAGNOSIS — I10 Essential (primary) hypertension: Secondary | ICD-10-CM | POA: Diagnosis not present

## 2015-02-05 DIAGNOSIS — I420 Dilated cardiomyopathy: Secondary | ICD-10-CM | POA: Diagnosis not present

## 2015-02-05 DIAGNOSIS — Z9861 Coronary angioplasty status: Secondary | ICD-10-CM

## 2015-02-05 DIAGNOSIS — I071 Rheumatic tricuspid insufficiency: Secondary | ICD-10-CM | POA: Diagnosis not present

## 2015-02-05 DIAGNOSIS — R0609 Other forms of dyspnea: Secondary | ICD-10-CM

## 2015-02-05 DIAGNOSIS — G4730 Sleep apnea, unspecified: Secondary | ICD-10-CM

## 2015-02-05 DIAGNOSIS — I2510 Atherosclerotic heart disease of native coronary artery without angina pectoris: Secondary | ICD-10-CM | POA: Diagnosis not present

## 2015-02-05 DIAGNOSIS — E785 Hyperlipidemia, unspecified: Secondary | ICD-10-CM | POA: Insufficient documentation

## 2015-02-05 DIAGNOSIS — I272 Other secondary pulmonary hypertension: Secondary | ICD-10-CM | POA: Insufficient documentation

## 2015-02-05 DIAGNOSIS — I429 Cardiomyopathy, unspecified: Secondary | ICD-10-CM | POA: Insufficient documentation

## 2015-02-05 DIAGNOSIS — Z6841 Body Mass Index (BMI) 40.0 and over, adult: Secondary | ICD-10-CM | POA: Diagnosis not present

## 2015-02-05 DIAGNOSIS — I482 Chronic atrial fibrillation: Secondary | ICD-10-CM | POA: Diagnosis not present

## 2015-02-05 DIAGNOSIS — I4821 Permanent atrial fibrillation: Secondary | ICD-10-CM

## 2015-02-06 ENCOUNTER — Telehealth: Payer: Self-pay | Admitting: *Deleted

## 2015-02-06 NOTE — Telephone Encounter (Signed)
Spoke to patient. Result given . Verbalized understanding  

## 2015-02-06 NOTE — Telephone Encounter (Signed)
-----   Message from Leonie Man, MD sent at 02/06/2015  9:37 AM EDT ----- EF (ejection fraction -- or pump function)  is definitely better than the Myoview showed, but still is lower than previously reported.  40-45% vs. 50% on last Echo.  We call a reduced EF ~ Cardiomyopathy. It suggests a regional wall motion abnormality that does not correlate with any known disease or stress test issues - may be related to Afib.   Moderate Pulmonary HTN remains.    For now, we will continue to treat Afib, & Cardiomyopathy -- symptomatic relief with meds.  Leonie Man, MD

## 2015-03-18 ENCOUNTER — Other Ambulatory Visit: Payer: Self-pay | Admitting: Cardiology

## 2015-05-15 ENCOUNTER — Other Ambulatory Visit: Payer: Self-pay | Admitting: Cardiology

## 2015-05-15 NOTE — Telephone Encounter (Signed)
Rx request sent to pharmacy.  

## 2015-05-20 ENCOUNTER — Emergency Department (HOSPITAL_COMMUNITY)
Admission: EM | Admit: 2015-05-20 | Discharge: 2015-05-20 | Disposition: A | Discharge status: Home / Self Care | Payer: Medicare Other | Attending: Emergency Medicine | Admitting: Emergency Medicine

## 2015-05-20 ENCOUNTER — Encounter (HOSPITAL_COMMUNITY): Payer: Self-pay | Admitting: Emergency Medicine

## 2015-05-20 ENCOUNTER — Emergency Department (HOSPITAL_COMMUNITY): Payer: Medicare Other

## 2015-05-20 DIAGNOSIS — E785 Hyperlipidemia, unspecified: Secondary | ICD-10-CM | POA: Diagnosis not present

## 2015-05-20 DIAGNOSIS — Z87891 Personal history of nicotine dependence: Secondary | ICD-10-CM | POA: Insufficient documentation

## 2015-05-20 DIAGNOSIS — Z791 Long term (current) use of non-steroidal anti-inflammatories (NSAID): Secondary | ICD-10-CM | POA: Insufficient documentation

## 2015-05-20 DIAGNOSIS — Z79899 Other long term (current) drug therapy: Secondary | ICD-10-CM | POA: Insufficient documentation

## 2015-05-20 DIAGNOSIS — Z9981 Dependence on supplemental oxygen: Secondary | ICD-10-CM | POA: Insufficient documentation

## 2015-05-20 DIAGNOSIS — J45901 Unspecified asthma with (acute) exacerbation: Secondary | ICD-10-CM | POA: Diagnosis not present

## 2015-05-20 DIAGNOSIS — E119 Type 2 diabetes mellitus without complications: Secondary | ICD-10-CM | POA: Insufficient documentation

## 2015-05-20 DIAGNOSIS — J40 Bronchitis, not specified as acute or chronic: Secondary | ICD-10-CM

## 2015-05-20 DIAGNOSIS — R2243 Localized swelling, mass and lump, lower limb, bilateral: Secondary | ICD-10-CM | POA: Insufficient documentation

## 2015-05-20 DIAGNOSIS — G4733 Obstructive sleep apnea (adult) (pediatric): Secondary | ICD-10-CM | POA: Insufficient documentation

## 2015-05-20 DIAGNOSIS — Z86018 Personal history of other benign neoplasm: Secondary | ICD-10-CM | POA: Insufficient documentation

## 2015-05-20 DIAGNOSIS — Z9861 Coronary angioplasty status: Secondary | ICD-10-CM | POA: Insufficient documentation

## 2015-05-20 DIAGNOSIS — Z794 Long term (current) use of insulin: Secondary | ICD-10-CM | POA: Diagnosis not present

## 2015-05-20 DIAGNOSIS — Z9889 Other specified postprocedural states: Secondary | ICD-10-CM | POA: Diagnosis not present

## 2015-05-20 DIAGNOSIS — M109 Gout, unspecified: Secondary | ICD-10-CM | POA: Diagnosis not present

## 2015-05-20 DIAGNOSIS — I129 Hypertensive chronic kidney disease with stage 1 through stage 4 chronic kidney disease, or unspecified chronic kidney disease: Secondary | ICD-10-CM | POA: Diagnosis not present

## 2015-05-20 DIAGNOSIS — Z853 Personal history of malignant neoplasm of breast: Secondary | ICD-10-CM | POA: Diagnosis not present

## 2015-05-20 DIAGNOSIS — N183 Chronic kidney disease, stage 3 (moderate): Secondary | ICD-10-CM | POA: Insufficient documentation

## 2015-05-20 DIAGNOSIS — R05 Cough: Secondary | ICD-10-CM | POA: Diagnosis present

## 2015-05-20 LAB — BASIC METABOLIC PANEL
ANION GAP: 11 (ref 5–15)
BUN: 23 mg/dL — AB (ref 6–20)
CALCIUM: 9.4 mg/dL (ref 8.9–10.3)
CO2: 28 mmol/L (ref 22–32)
Chloride: 106 mmol/L (ref 101–111)
Creatinine, Ser: 1.19 mg/dL — ABNORMAL HIGH (ref 0.44–1.00)
GFR calc Af Amer: 54 mL/min — ABNORMAL LOW (ref >60)
GFR, EST NON AFRICAN AMERICAN: 47 mL/min — AB (ref >60)
Glucose, Bld: 122 mg/dL — ABNORMAL HIGH (ref 65–99)
POTASSIUM: 3.1 mmol/L — AB (ref 3.5–5.1)
SODIUM: 145 mmol/L (ref 135–145)

## 2015-05-20 LAB — CBC WITH DIFFERENTIAL/PLATELET
BASOS ABS: 0 10*3/uL (ref 0.0–0.1)
Basophils Relative: 0 %
EOS ABS: 0.2 10*3/uL (ref 0.0–0.7)
EOS PCT: 3 %
HCT: 35.5 % — ABNORMAL LOW (ref 36.0–46.0)
Hemoglobin: 11 g/dL — ABNORMAL LOW (ref 12.0–15.0)
Lymphocytes Relative: 28 %
Lymphs Abs: 2 10*3/uL (ref 0.7–4.0)
MCH: 27 pg (ref 26.0–34.0)
MCHC: 31 g/dL (ref 30.0–36.0)
MCV: 87.2 fL (ref 78.0–100.0)
MONO ABS: 0.4 10*3/uL (ref 0.1–1.0)
Monocytes Relative: 5 %
Neutro Abs: 4.4 10*3/uL (ref 1.7–7.7)
Neutrophils Relative %: 64 %
PLATELETS: 202 10*3/uL (ref 150–400)
RBC: 4.07 MIL/uL (ref 3.87–5.11)
RDW: 17.5 % — AB (ref 11.5–15.5)
WBC: 6.9 10*3/uL (ref 4.0–10.5)

## 2015-05-20 LAB — I-STAT TROPONIN, ED: Troponin i, poc: 0.03 ng/mL (ref 0.00–0.08)

## 2015-05-20 LAB — BRAIN NATRIURETIC PEPTIDE: B NATRIURETIC PEPTIDE 5: 153.7 pg/mL — AB (ref 0.0–100.0)

## 2015-05-20 MED ORDER — METHYLPREDNISOLONE SODIUM SUCC 125 MG IJ SOLR
125.0000 mg | Freq: Once | INTRAMUSCULAR | Status: AC
  Administered 2015-05-20: 125 mg via INTRAVENOUS
  Filled 2015-05-20: qty 2

## 2015-05-20 MED ORDER — ALBUTEROL SULFATE (2.5 MG/3ML) 0.083% IN NEBU
5.0000 mg | INHALATION_SOLUTION | Freq: Once | RESPIRATORY_TRACT | Status: AC
  Administered 2015-05-20: 5 mg via RESPIRATORY_TRACT
  Filled 2015-05-20: qty 6

## 2015-05-20 MED ORDER — ALBUTEROL SULFATE HFA 108 (90 BASE) MCG/ACT IN AERS: 1.0000 | INHALATION_SPRAY | Freq: Four times a day (QID) | RESPIRATORY_TRACT | Status: AC | PRN

## 2015-05-20 MED ORDER — PREDNISONE 10 MG PO TABS: 40.0000 mg | ORAL_TABLET | Freq: Every day | ORAL | Status: DC

## 2015-05-20 MED ORDER — IPRATROPIUM-ALBUTEROL 0.5-2.5 (3) MG/3ML IN SOLN
3.0000 mL | Freq: Once | RESPIRATORY_TRACT | Status: AC
  Administered 2015-05-20: 3 mL via RESPIRATORY_TRACT
  Filled 2015-05-20: qty 3

## 2015-05-20 MED ORDER — ALBUTEROL SULFATE (2.5 MG/3ML) 0.083% IN NEBU: 2.5000 mg | INHALATION_SOLUTION | Freq: Four times a day (QID) | RESPIRATORY_TRACT | Status: DC | PRN

## 2015-05-20 NOTE — ED Provider Notes (Signed)
CSN: [id number]     Arrival date & time 05/20/15  [id number] History   First MD Initiated Contact with Patient 05/20/15 1017     Chief Complaint  Patient presents with  . Cough     (Consider location/radiation/quality/duration/timing/severity/associated sxs/prior Treatment) HPI Comments: 65 year old female with history of coronary artery disease, atrial fibrillation, diabetes mellitus, chronic kidney disease presents for cough and wheezing. The patient reports that she has had these symptoms for the last 3 weeks. She reports that she has had improvement in the symptoms but that they've been lingering and that the cough is getting unbearable. She denies coughing up yellow or green sputum. She reports she's been taking her inhalers and breathing treatments at home with minimal relief. She states that she saw her PCP for this issue and was given 2 doses of Z-Pak without resolution of symptoms. She and her husband both report that she was not prescribed any steroids and that usually they feel the steroids are would help her to get rid of the cough. She states she has episodes like this just about once every year. She denies any swelling or chest pain.   Past Medical History  Diagnosis Date  . CAD S/P percutaneous coronary angioplasty 2006;     PCI of circumflex with Taxus DES;; relook-cath Feb 2013: 50-60% short lesion in RCA, 40% ISR Circumflex stent.  . Stented coronary artery     40% ISR in Circumflex stent  . Hypertension   . Hyperlipidemia   . Morbid obesity (HCC)     BMI 41  . Breast cancer ([place]) 2008    S/P mastectomy  . Atrial fibrillation, permanent ([place])   . Asthma   . 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 OSA on CPAP     On CPAP  . Gout   . Diabetes mellitus, uncontrolled 07/14/2011  . History of echocardiogram 07/22/2011  . Edema of both legs     Usually mild, chronic. Controlled with when necessary furosemide and diet  . CKD (chronic kidney disease), stage III   . Dilated  cardiomyopathy ([place])     Severely depressed LV systolic function with EF of 25% range. Very limited study. Diffuse hypokinesis.   Past Surgical History  Procedure Laterality Date  . Coronary angioplasty with stent placement  2006    stent to circumflex  . Mastectomy  2008    left  . Transthoracic echocardiogram  March 2013    LV borderline dilated; borderline concentric LVH; EF [id number]; RV sysolic function mildly reduced; LA severely dilated; RA mod-severely dilated; mild-mod MR; mild TR; mod pulm HTN;   . Left heart catheterization with coronary angiogram N/A 07/13/2011    Procedure: LEFT HEART CATHETERIZATION WITH CORONARY ANGIOGRAM;  Surgeon: Leonie Man, MD;  Location: [facility] CATH LAB;  Service: Cardiovascular;  normal EF; 53mm 50-60% lesion in RCA; patent stent in circumflex form 2006 with ~40% in-stent re-stenosis  . Transthoracic echocardiogram  June 2015    In setting of Urosepsis: EF ~25 %, global HK (poor quality study)  . Nm myoview ltd  11/22/2014    Low Risk. No ischemia or infarction. Not gated 2/2  A. fib - unable to assess EF   Family History  Problem Relation Age of Onset  . Coronary artery disease Mother   . Hypertension Mother   . Atrial fibrillation Son    Social History  Substance Use Topics  . Smoking status: Former Smoker -- 0.50 packs/day for 6 years  Types: Cigarettes    Quit date: 05/18/1992  . Smokeless tobacco: Never Used  . Alcohol Use: No     Comment: 07/13/11 "have drank occasionally; not now"   OB History    No data available     Review of Systems  Constitutional: Negative for fever, chills, appetite change and fatigue.  HENT: Positive for congestion, postnasal drip, rhinorrhea and sinus pressure. Negative for nosebleeds.   Eyes: Negative for pain and redness.  Respiratory: Positive for cough, shortness of breath and wheezing. Negative for chest tightness.   Cardiovascular: Negative for chest pain, palpitations and leg swelling.   Gastrointestinal: Negative for nausea, vomiting, abdominal pain and diarrhea.  Genitourinary: Negative for dysuria, urgency and hematuria.  Musculoskeletal: Negative for myalgias and back pain.  Skin: Negative for rash.  Neurological: Negative for dizziness, weakness, numbness and headaches.  Hematological: Bruises/bleeds easily.      Allergies  Review of patient's allergies indicates no known allergies.  Home Medications   Prior to Admission medications   Medication Sig Start Date End Date Taking? Authorizing Provider  allopurinol (ZYLOPRIM) 300 MG tablet Take 300 mg by mouth daily as needed (for gout flares). Gout   Yes Historical Provider, MD  BYSTOLIC 10 MG tablet TAKE 1 TABLET (10 MG TOTAL) BY MOUTH DAILY. 05/15/15  Yes Leonie Man, MD  cholecalciferol (VITAMIN D) 1000 UNITS tablet Take 2,000 Units by mouth daily.   Yes Historical Provider, MD  colchicine 0.6 MG tablet Take 0.6 mg by mouth daily as needed. Take 2 tablets intially, then 1 tablet in 2 hours , Then 1 tablet daily For gout   Yes Historical Provider, MD  dabigatran (PRADAXA) 150 MG CAPS capsule Take 1 capsule (150 mg total) by mouth 2 (two) times daily. 01/29/15  Yes Leonie Man, MD  ferrous sulfate 325 (65 FE) MG EC tablet Take 325 mg by mouth daily with breakfast.   Yes Historical Provider, MD  fexofenadine (ALLEGRA) 180 MG tablet Take 180 mg by mouth daily as needed for allergies.  04/30/15  Yes Historical Provider, MD  fluticasone (FLONASE) 50 MCG/ACT nasal spray Place 2 sprays into the nose as needed for rhinitis.   Yes Historical Provider, MD  furosemide (LASIX) 80 MG tablet Take 80 mg by mouth daily.   Yes Historical Provider, MD  glucose blood (ACCU-CHEK SMARTVIEW) test strip 1 each by Other route as needed for other. Use as instructed   Yes Historical Provider, MD  Insulin Glargine (TOUJEO SOLOSTAR) 300 UNIT/ML SOPN Inject 16 Units into the skin every evening.   Yes Historical Provider, MD  isosorbide  mononitrate (IMDUR) 60 MG 24 hr tablet Take 1 tablet (60 mg total) by mouth daily. 06/11/14  Yes Leonie Man, MD  levalbuterol Penne Lash) 0.63 MG/3ML nebulizer solution Take 3 mLs (0.63 mg total) by nebulization every 6 (six) hours as needed for wheezing or shortness of breath. 07/01/13  Yes Bonnielee Haff, MD  LORazepam (ATIVAN) 0.5 MG tablet Take 0.5 mg by mouth 2 (two) times daily.   Yes Historical Provider, MD  meloxicam (MOBIC) 15 MG tablet Take 15 mg by mouth daily.   Yes Historical Provider, MD  Multiple Vitamins-Minerals (MULTIVITAMIN WITH MINERALS) tablet Take 1 tablet by mouth daily.   Yes Historical Provider, MD  nitroGLYCERIN (NITROSTAT) 0.4 MG SL tablet Place 1 tablet (0.4 mg total) under the tongue every 5 (five) minutes as needed for chest pain. 01/24/14  Yes Leonie Man, MD  pantoprazole (PROTONIX) 40 MG tablet Take  1 tablet (40 mg total) by mouth daily. 06/11/14  Yes Leonie Man, MD  potassium chloride SA (K-DUR,KLOR-CON) 20 MEQ tablet Take 1 tablet (20 mEq total) by mouth 2 (two) times daily. 06/11/14  Yes Leonie Man, MD  promethazine (PHENERGAN) 6.25 MG/5ML syrup Take 5 mLs by mouth 4 (four) times daily as needed. cough 05/13/15  Yes Historical Provider, MD  valsartan-hydrochlorothiazide (DIOVAN-HCT) 320-25 MG per tablet Take 1 tablet by mouth daily.   Yes Historical Provider, MD  ZETIA 10 MG tablet TAKE 1 TABLET (10 MG TOTAL) BY MOUTH DAILY. 03/19/15  Yes Leonie Man, MD  albuterol (PROVENTIL HFA;VENTOLIN HFA) 108 (90 Base) MCG/ACT inhaler Inhale 1-2 puffs into the lungs every 6 (six) hours as needed for wheezing or shortness of breath. 05/20/15   Harvel Quale, MD  albuterol (PROVENTIL) (2.5 MG/3ML) 0.083% nebulizer solution Take 3 mLs (2.5 mg total) by nebulization every 6 (six) hours as needed for wheezing or shortness of breath. 05/20/15   Harvel Quale, MD  azithromycin (ZITHROMAX) 250 MG tablet Take 250 mg by mouth as directed. Reported on 05/20/2015 05/13/15    Historical Provider, MD  furosemide (LASIX) 20 MG tablet Take 3 tablets (60 mg total) by mouth 2 (two) times daily. Please do not take on Saturday and Sunday. Patient not taking: Reported on 05/20/2015 11/03/14   Thurnell Lose, MD  predniSONE (DELTASONE) 10 MG tablet Take 4 tablets (40 mg total) by mouth daily. 05/20/15   Harvel Quale, MD   BP 169/108 mmHg  Pulse 64  Temp(Src) 98.2 F (36.8 C) (Oral)  Resp 25  SpO2 96% Physical Exam  Constitutional: She is oriented to person, place, and time. She appears well-developed and well-nourished. No distress.  HENT:  Head: Normocephalic and atraumatic.  Right Ear: External ear normal.  Left Ear: External ear normal.  Nose: Nose normal.  Mouth/Throat: Oropharynx is clear and moist. No oropharyngeal exudate.  Eyes: EOM are normal. Pupils are equal, round, and reactive to light.  Neck: Normal range of motion. Neck supple.  Cardiovascular: Normal rate, regular rhythm, normal heart sounds and intact distal pulses.   No murmur heard. Pulmonary/Chest: Effort normal. No respiratory distress. She has wheezes (bilateral, moderate, symmetric). She has no rales.  Abdominal: Soft. She exhibits no distension. There is no tenderness.  Musculoskeletal: Normal range of motion. She exhibits edema (1+ pitting edema of the bilateral lower extremities which is symmetric). She exhibits no tenderness.  Neurological: She is alert and oriented to person, place, and time.  Skin: Skin is warm and dry. No rash noted. She is not diaphoretic.  Vitals reviewed.   ED Course  Procedures (including critical care time) Labs Review Labs Reviewed  CBC WITH DIFFERENTIAL/PLATELET - Abnormal; Notable for the following:    Hemoglobin 11.0 (*)    HCT 35.5 (*)    RDW 17.5 (*)    All other components within normal limits  BASIC METABOLIC PANEL - Abnormal; Notable for the following:    Potassium 3.1 (*)    Glucose, Bld 122 (*)    BUN 23 (*)    Creatinine, Ser 1.19 (*)     GFR calc non Af Amer 47 (*)    GFR calc Af Amer 54 (*)    All other components within normal limits  BRAIN NATRIURETIC PEPTIDE - Abnormal; Notable for the following:    B Natriuretic Peptide 153.7 (*)    All other components within normal limits  I-STAT TROPOININ, ED  Imaging Review Dg Chest 2 View  05/20/2015  CLINICAL DATA:  65 year old female with history of cough and wheezing for the past couple of weeks. History of asthma and atrial fibrillation. Nonsmoker. EXAM: CHEST  2 VIEW COMPARISON:  Chest x-ray 10/28/2014. FINDINGS: Lung volumes are normal. No consolidative airspace disease. No pleural effusions. No evidence of pulmonary edema. Moderate enlargement of the cardiopericardial silhouette. Several calcified left hilar and mediastinal lymph nodes are noted. Upper mediastinal contours are within normal limits. Atherosclerosis in the thoracic aorta. IMPRESSION: 1. Moderate enlargement of the cardiopericardial silhouette. This favored to reflect cardiomegaly, but the presence of a pericardial effusion is not excluded. Correlation with echocardiography may be warranted if clinically appropriate. 2. Atherosclerosis. Electronically Signed   By: Vinnie Langton M.D.   On: 05/20/2015 09:50   I have personally reviewed and evaluated these images and lab results as part of my medical decision-making.   EKG Interpretation   Date/Time:  Monday May 20 2015 11:16:36 EST Ventricular Rate:  65 PR Interval:    QRS Duration: 129 QT Interval:  468 QTC Calculation: 487 R Axis:   -69 Text Interpretation:  Atrial fibrillation Ventricular premature complex  Nonspecific IVCD with LAD LVH with secondary repolarization abnormality  Anterior infarct, old No significant change since last tracing Confirmed  by Lonia Skinner ([id number]) on 05/20/2015 12:33:07 PM      MDM  Patient was seen and evaluated in stable condition.  She reported significant improvement with breathing treatments and Solu-Medrol. Her  respiratory examination improved as well after treatments. Chest x-ray was noted to have cardiomegaly. BNP is mildly elevated but less than her previous BNP. Other laboratory studies unremarkable for patient. Clinically symptoms appear most consistent with an upper respiratory infection or bronchitis. Discussed with patient the need for follow-up palpation. Patient felt well enough for discharge. She was discharged home in stable condition with prescriptions for prednisone as well as for refills for her albuterol. Strict return precautions given. Final diagnoses:  Bronchitis    1. Bronchitis  2. Upper respiratory infection    Harvel Quale, MD 05/20/15 1328

## 2015-05-20 NOTE — Discharge Instructions (Signed)
You were seen today for your continued cough. Take the steroids as directed. Continue using her breathing treatments at home. Follow up with your primary care physician to ensure that your symptoms are improving. Return with chest pain, palpitations, increased swelling.  Acute Bronchitis Bronchitis is inflammation of the airways that extend from the windpipe into the lungs (bronchi). The inflammation often causes mucus to develop. This leads to a cough, which is the most common symptom of bronchitis.  In acute bronchitis, the condition usually develops suddenly and goes away over time, usually in a couple weeks. Smoking, allergies, and asthma can make bronchitis worse. Repeated episodes of bronchitis may cause further lung problems.  CAUSES Acute bronchitis is most often caused by the same virus that causes a cold. The virus can spread from person to person (contagious) through coughing, sneezing, and touching contaminated objects. SIGNS AND SYMPTOMS   Cough.   Fever.   Coughing up mucus.   Body aches.   Chest congestion.   Chills.   Shortness of breath.   Sore throat.  DIAGNOSIS  Acute bronchitis is usually diagnosed through a physical exam. Your health care provider will also ask you questions about your medical history. Tests, such as chest X-rays, are sometimes done to rule out other conditions.  TREATMENT  Acute bronchitis usually goes away in a couple weeks. Oftentimes, no medical treatment is necessary. Medicines are sometimes given for relief of fever or cough. Antibiotic medicines are usually not needed but may be prescribed in certain situations. In some cases, an inhaler may be recommended to help reduce shortness of breath and control the cough. A cool mist vaporizer may also be used to help thin bronchial secretions and make it easier to clear the chest.  HOME CARE INSTRUCTIONS  Get plenty of rest.   Drink enough fluids to keep your urine clear or pale yellow  (unless you have a medical condition that requires fluid restriction). Increasing fluids may help thin your respiratory secretions (sputum) and reduce chest congestion, and it will prevent dehydration.   Take medicines only as directed by your health care provider.  If you were prescribed an antibiotic medicine, finish it all even if you start to feel better.  Avoid smoking and secondhand smoke. Exposure to cigarette smoke or irritating chemicals will make bronchitis worse. If you are a smoker, consider using nicotine gum or skin patches to help control withdrawal symptoms. Quitting smoking will help your lungs heal faster.   Reduce the chances of another bout of acute bronchitis by washing your hands frequently, avoiding people with cold symptoms, and trying not to touch your hands to your mouth, nose, or eyes.   Keep all follow-up visits as directed by your health care provider.  SEEK MEDICAL CARE IF: Your symptoms do not improve after 1 week of treatment.  SEEK IMMEDIATE MEDICAL CARE IF:  You develop an increased fever or chills.   You have chest pain.   You have severe shortness of breath.  You have bloody sputum.   You develop dehydration.  You faint or repeatedly feel like you are going to pass out.  You develop repeated vomiting.  You develop a severe headache. MAKE SURE YOU:   Understand these instructions.  Will watch your condition.  Will get help right away if you are not doing well or get worse.   This information is not intended to replace advice given to you by your health care provider. Make sure you discuss any questions you have  with your health care provider.   Document Released: 06/11/2004 Document Revised: 05/25/2014 Document Reviewed: 10/25/2012 Elsevier Interactive Patient Education [employer].

## 2015-05-20 NOTE — ED Notes (Signed)
Bed: WA02 Expected date:  Expected time:  Means of arrival:  Comments: 

## 2015-05-20 NOTE — ED Notes (Signed)
Pt c/o cough, wheezing x 3 weeks. Sore throat present as well 3 weeks ago, since resolved. Anterior lung lobes clear bilaterally, posterior diminished with expiratory wheezes bilaterally.

## 2015-06-11 ENCOUNTER — Other Ambulatory Visit: Payer: Self-pay | Admitting: Cardiology

## 2015-06-11 NOTE — Telephone Encounter (Signed)
Rx(s) sent to pharmacy electronically.  

## 2015-06-18 ENCOUNTER — Other Ambulatory Visit: Payer: Self-pay | Admitting: Cardiology

## 2015-07-18 ENCOUNTER — Other Ambulatory Visit: Payer: Self-pay | Admitting: Cardiology

## 2015-07-18 NOTE — Telephone Encounter (Signed)
REFILL 

## 2015-08-22 ENCOUNTER — Ambulatory Visit (HOSPITAL_BASED_OUTPATIENT_CLINIC_OR_DEPARTMENT_OTHER): Payer: Medicare Other | Attending: Family Medicine | Admitting: *Deleted

## 2015-08-22 VITALS — Ht 63.0 in | Wt 237.0 lb

## 2015-08-22 DIAGNOSIS — R0683 Snoring: Secondary | ICD-10-CM | POA: Insufficient documentation

## 2015-08-22 DIAGNOSIS — G4733 Obstructive sleep apnea (adult) (pediatric): Secondary | ICD-10-CM | POA: Diagnosis not present

## 2015-08-22 DIAGNOSIS — G4700 Insomnia, unspecified: Secondary | ICD-10-CM | POA: Insufficient documentation

## 2015-08-22 DIAGNOSIS — Z79899 Other long term (current) drug therapy: Secondary | ICD-10-CM | POA: Insufficient documentation

## 2015-08-25 DIAGNOSIS — G4733 Obstructive sleep apnea (adult) (pediatric): Secondary | ICD-10-CM

## 2015-08-25 NOTE — Progress Notes (Signed)
   Patient Name: Stephanie Franco, Stephanie Franco Date: 08/22/2015 Gender: Female D.O.B: [birth year] Age (years): 64 Referring Provider: Lin Landsman Height (inches): 63 Interpreting Physician: Baird Lyons MD, ABSM Weight (lbs): 237 RPSGT: Gerhard Perches BMI: 42 MRN: [id number] Neck Size: 16.50 CLINICAL INFORMATION Sleep Study Type: NPSG Indication for sleep study: OSA Epworth Sleepiness Score: 15  SLEEP STUDY TECHNIQUE As per the AASM Manual for the Scoring of Sleep and Associated Events v2.3 (April 2016) with a hypopnea requiring 4% desaturations. The channels recorded and monitored were frontal, central and occipital EEG, electrooculogram (EOG), submentalis EMG (chin), nasal and oral airflow, thoracic and abdominal wall motion, anterior tibialis EMG, snore microphone, electrocardiogram, and pulse oximetry.  MEDICATIONS Patient's medications include: charted for review Medications self-administered by patient during sleep study : LORAZEPAM.  SLEEP ARCHITECTURE The study was initiated at 10:38:30 PM and ended at 4:48:42 AM. Sleep onset time was 184.1 minutes and the sleep efficiency was 28.5%. The total sleep time was 105.5 minutes. Stage REM latency was 75.0 minutes. The patient spent 14.22% of the night in stage N1 sleep, 73.46% in stage N2 sleep, 0.00% in stage N3 and 12.32% in REM. Alpha intrusion was absent. Supine sleep was 6.64%. Wake after sleep onset 80 minutes  RESPIRATORY PARAMETERS The overall apnea/hypopnea index (AHI) was 2.3 per hour. There were 0 total apneas, including 0 obstructive, 0 central and 0 mixed apneas. There were 4 hypopneas and 2 RERAs. The AHI during Stage REM sleep was 0.0 per hour. AHI while supine was 8.6 per hour. The mean oxygen saturation was 92.48%. The minimum SpO2 during sleep was 88.00%. Moderate snoring was noted during this study.  CARDIAC DATA The 2 lead EKG demonstrated sinus rhythm. The mean heart rate was 56.57 beats per minute.  Other EKG findings include: None.  LEG MOVEMENT DATA The total PLMS were 0 with a resulting PLMS index of 0.00. Associated arousal with leg movement index was 0.0 .  IMPRESSIONS - No significant obstructive sleep apnea occurred during this study (AHI = 2.3/h). - No significant central sleep apnea occurred during this study (CAI = 0.0/h). - The patient had minimal or no oxygen desaturation during the study (Min O2 = 88.00%) - The patient snored with Moderate snoring volume. - No cardiac abnormalities were noted during this study. - Clinically significant periodic limb movements did not occur during sleep. No significant associated arousals. - Significant difficulty initiating and maintaining sleep, with sleep onset delayed until 2:45 AM despite lorazepam.   DIAGNOSIS - Primary Snoring (786.09 [R06.83 ICD-10]) - Difficulty initiating and maintaining sleep- insomnia  RECOMMENDATIONS - Avoid alcohol, sedatives and other CNS depressants that may worsen sleep apnea and disrupt normal sleep architecture. - Sleep hygiene should be reviewed to assess factors that may improve sleep quality. - Weight management and regular exercise should be initiated or continued if appropriate.  Deneise Lever Diplomate, American Board of Sleep Medicine  ELECTRONICALLY SIGNED ON:  08/25/2015, 2:54 PM [place] PH: [phone]   FX: [phone] [place]

## 2015-09-10 ENCOUNTER — Other Ambulatory Visit: Payer: Self-pay | Admitting: Cardiology

## 2015-09-17 ENCOUNTER — Other Ambulatory Visit: Payer: Self-pay | Admitting: Cardiology

## 2015-09-17 NOTE — Telephone Encounter (Signed)
Rx request sent to pharmacy.  

## 2015-10-08 ENCOUNTER — Encounter (HOSPITAL_BASED_OUTPATIENT_CLINIC_OR_DEPARTMENT_OTHER): Payer: Medicare Other

## 2015-10-16 ENCOUNTER — Other Ambulatory Visit: Payer: Self-pay | Admitting: Cardiology

## 2015-10-16 NOTE — Telephone Encounter (Signed)
Rx(s) sent to pharmacy electronically. MD OV 11/12/15

## 2015-11-12 ENCOUNTER — Encounter: Payer: Self-pay | Admitting: Cardiology

## 2015-11-12 ENCOUNTER — Ambulatory Visit (INDEPENDENT_AMBULATORY_CARE_PROVIDER_SITE_OTHER): Payer: Medicare Other | Admitting: Cardiology

## 2015-11-12 VITALS — BP 128/80 | HR 56 | Ht 62.0 in | Wt 243.8 lb

## 2015-11-12 DIAGNOSIS — R0609 Other forms of dyspnea: Secondary | ICD-10-CM | POA: Diagnosis not present

## 2015-11-12 DIAGNOSIS — E785 Hyperlipidemia, unspecified: Secondary | ICD-10-CM

## 2015-11-12 DIAGNOSIS — Z9861 Coronary angioplasty status: Secondary | ICD-10-CM | POA: Diagnosis not present

## 2015-11-12 DIAGNOSIS — I10 Essential (primary) hypertension: Secondary | ICD-10-CM | POA: Diagnosis not present

## 2015-11-12 DIAGNOSIS — G4730 Sleep apnea, unspecified: Secondary | ICD-10-CM

## 2015-11-12 DIAGNOSIS — I2510 Atherosclerotic heart disease of native coronary artery without angina pectoris: Secondary | ICD-10-CM | POA: Diagnosis not present

## 2015-11-12 DIAGNOSIS — R600 Localized edema: Secondary | ICD-10-CM

## 2015-11-12 DIAGNOSIS — I5032 Chronic diastolic (congestive) heart failure: Secondary | ICD-10-CM

## 2015-11-12 DIAGNOSIS — I420 Dilated cardiomyopathy: Secondary | ICD-10-CM

## 2015-11-12 DIAGNOSIS — I482 Chronic atrial fibrillation: Secondary | ICD-10-CM

## 2015-11-12 DIAGNOSIS — E876 Hypokalemia: Secondary | ICD-10-CM

## 2015-11-12 DIAGNOSIS — I4821 Permanent atrial fibrillation: Secondary | ICD-10-CM

## 2015-11-12 MED ORDER — POTASSIUM CHLORIDE CRYS ER 20 MEQ PO TBCR: 20.0000 meq | EXTENDED_RELEASE_TABLET | Freq: Two times a day (BID) | ORAL | Status: DC

## 2015-11-12 MED ORDER — DABIGATRAN ETEXILATE MESYLATE 150 MG PO CAPS: ORAL_CAPSULE | ORAL | Status: DC

## 2015-11-12 MED ORDER — FUROSEMIDE 80 MG PO TABS: 80.0000 mg | ORAL_TABLET | Freq: Two times a day (BID) | ORAL | Status: DC

## 2015-11-12 MED ORDER — VALSARTAN-HYDROCHLOROTHIAZIDE 320-25 MG PO TABS: 1.0000 | ORAL_TABLET | Freq: Every day | ORAL | Status: DC

## 2015-11-12 MED ORDER — NEBIVOLOL HCL 10 MG PO TABS: 10.0000 mg | ORAL_TABLET | Freq: Every day | ORAL | Status: DC

## 2015-11-12 MED ORDER — EZETIMIBE 10 MG PO TABS: ORAL_TABLET | ORAL | Status: DC

## 2015-11-12 MED ORDER — ISOSORBIDE MONONITRATE ER 60 MG PO TB24: ORAL_TABLET | ORAL | Status: DC

## 2015-11-12 MED ORDER — PANTOPRAZOLE SODIUM 40 MG PO TBEC: DELAYED_RELEASE_TABLET | ORAL | Status: DC

## 2015-11-12 NOTE — Patient Instructions (Signed)
   Sliding scale Lasix: Weigh yourself when you get home, then Daily in the Morning. Your dry weight will be what your scale says on the day you return home.(here is 243 lbs.).   If you gain more than 3 pounds from dry weight: Increase the Lasix dosing to AN EXTRA DOSE 20 mg( TOTAL 100 MG) in the morning and 20 mg (TOTAL 100 MG) in the afternoon until weight returns to baseline dry weight.  If weight gain is greater than 5 pounds in 2 days: Increased to Lasix 160 mg (2 TABLET OF 80 MG FOR MORNING DOSE AND 80 MG IN THE EVENING .and contact the office for further assistance if weight does not go down the next day.  If the weight goes down more than 3 pounds from dry weight: Hold Lasix until it returns to baseline dry weight  REFERRAL FOR DR Claiborne Billings [facility] - SLEEP CLINIC - PATIENT HAS SLEEP APNEA  Your physician wants you to follow-up in:  OCT OR NOV 2017   WITH DR HARDING--30 MIN You will receive a reminder letter in the mail two months in advance. If you don't receive a letter, please call our office to schedule the follow-up appointment.   If you need a refill on your cardiac medications before your next appointment, please call your pharmacy.

## 2015-11-12 NOTE — Progress Notes (Signed)
PCP: Kristine Garbe, MD  Clinic Note: Chief Complaint  Patient presents with  . Follow-up  . Shortness of Breath    Chronic congestive heart failure  . Atrial Fibrillation    HPI: Stephanie Franco is a 65 y.o. female with a PMH below who presents today for Early annual follow-up CAD-PCI, PAF and cardiomyopathy.Stephanie Franco was last seen In September 2016 following a hospital stay for Escherichia coli sepsis from pyelonephritis. At that time her EF was down to 20% from 50-55% likely related to her illness. No diffuse hypokinesis. When I saw her in clinic, she was doing well. No issues with A. fib. No anginal symptoms. Minimal edema. Was getting back to her aspirin status.  Recent Hospitalizations: None  Studies Reviewed:   Echocardiogram September 2016: EF improved to 40 at 45% with mild anteroseptal hypokinesis. Moderate MR. Moderate pulmonary hypertension with peak PA pressures of 63 mmHg.  Interval History: [name] presents today still noticing some exertional dyspnea and orthopnea, but has improved some. Her PCP had increased her Lasix to 60 mg twice a day, and has improved her symptoms somewhat. Partially, confusing the issue is that she has been having trouble with her OSA, and needs more evaluation to reset her CPAP. Apparently she probably needs symptoms changes in her settings, she cannot tolerate the current settings.  She denies any chest tightness or pressure with rest or exertion. She does have some occasional orthopnea and slightly increased edema with exertional dyspnea.  No palpitations, lightheadedness, dizziness, weakness or syncope/near syncope. No TIA/amaurosis fugax symptoms. No melena, hematochezia, hematuria, or epstaxis. No claudication.  ROS: A comprehensive was performed. Review of Systems  Constitutional: Positive for malaise/fatigue (Decreased exercise tolerance due to dyspnea).  Respiratory: Positive for shortness of breath. Negative for cough  and wheezing.        Unable to tolerate CPAP settings  Cardiovascular: Positive for orthopnea and leg swelling. Negative for claudication.       Per history of present illness  Gastrointestinal: Negative for heartburn.  Musculoskeletal: Positive for joint pain. Negative for myalgias.  Neurological: Negative for dizziness, loss of consciousness and headaches.  Endo/Heme/Allergies: Bruises/bleeds easily.  Psychiatric/Behavioral: Negative for depression and memory loss. The patient is not nervous/anxious and does not have insomnia.   All other systems reviewed and are negative.    Past Medical History  Diagnosis Date  . CAD S/P percutaneous coronary angioplasty 2006;     PCI of circumflex with Taxus DES;; relook-cath Feb 2013: 50-60% short lesion in RCA, 40% ISR Circumflex stent.  . Stented coronary artery     40% ISR in Circumflex stent  . Hypertension   . Hyperlipidemia   . Morbid obesity (HCC)     BMI 41  . Breast cancer ([place]) 2008    S/P mastectomy  . Atrial fibrillation, permanent ([place])     Rate control with Bystolic. CHA2DS2Vasc = 6 (HTN, DM, CHF, Age 35, Female) -> on Pradaxa  . Asthma   . 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 OSA on CPAP     On CPAP  . Gout   . Diabetes mellitus, uncontrolled 07/14/2011  . History of echocardiogram 07/22/2011  . Edema of both legs     Usually mild, chronic. Controlled with when necessary furosemide and diet  . CKD (chronic kidney disease), stage III   . Dilated cardiomyopathy ([place])     Severely depressed LV systolic function with EF of 25% range. Very  limited study. Diffuse hypokinesis.    Past Surgical History  Procedure Laterality Date  . Coronary angioplasty with stent placement  2006    stent to circumflex  . Mastectomy  2008    left  . Transthoracic echocardiogram  March 2013    LV borderline dilated; borderline concentric LVH; EF 99991111; RV sysolic function mildly reduced; LA severely dilated; RA mod-severely dilated;  mild-mod MR; mild TR; mod pulm HTN;   . Left heart catheterization with coronary angiogram N/A 07/13/2011    Procedure: LEFT HEART CATHETERIZATION WITH CORONARY ANGIOGRAM;  Surgeon: Leonie Man, MD;  Location: [facility] CATH LAB;  Service: Cardiovascular;  normal EF; 22mm 50-60% lesion in RCA; patent stent in circumflex form 2006 with ~40% in-stent re-stenosis  . Transthoracic echocardiogram  June 2015    In setting of Urosepsis: EF ~25 %, global HK (poor quality study)  . Nm myoview ltd  11/22/2014    Low Risk. No ischemia or infarction. Not gated 2/2  A. fib - unable to assess EF    Prior to Admission medications   Medication Sig Start Date End Date Taking? Authorizing Provider  albuterol (PROVENTIL HFA;VENTOLIN HFA) 108 (90 Base) MCG/ACT inhaler Inhale 1-2 puffs into the lungs every 6 (six) hours as needed for wheezing or shortness of breath. 05/20/15  Yes Harvel Quale, MD  albuterol (PROVENTIL) (2.5 MG/3ML) 0.083% nebulizer solution Take 3 mLs (2.5 mg total) by nebulization every 6 (six) hours as needed for wheezing or shortness of breath. 05/20/15  Yes Harvel Quale, MD  allopurinol (ZYLOPRIM) 300 MG tablet Take 300 mg by mouth daily as needed (for gout flares). Gout   Yes Historical Provider, MD  azithromycin (ZITHROMAX) 250 MG tablet Take 250 mg by mouth as directed. Reported on 05/20/2015 05/13/15  Yes Historical Provider, MD  celecoxib (CELEBREX) 200 MG capsule Take 200 mg by mouth 2 (two) times daily. 10/21/15  Yes Historical Provider, MD  cholecalciferol (VITAMIN D) 1000 UNITS tablet Take 2,000 Units by mouth daily.   Yes Historical Provider, MD  colchicine 0.6 MG tablet Take 0.6 mg by mouth daily as needed. Take 2 tablets intially, then 1 tablet in 2 hours , Then 1 tablet daily For gout   Yes Historical Provider, MD  ferrous sulfate 325 (65 FE) MG EC tablet Take 325 mg by mouth daily with breakfast.   Yes Historical Provider, MD  fexofenadine (ALLEGRA) 180 MG tablet Take 180 mg by mouth  daily as needed for allergies.  04/30/15  Yes Historical Provider, MD  fluticasone (FLONASE) 50 MCG/ACT nasal spray Place 2 sprays into the nose as needed for rhinitis.   Yes Historical Provider, MD  furosemide (LASIX) 20 MG tablet Take 3 tablets (60 mg total) by mouth 2 (two) times daily. Please do not take on Saturday and Sunday. 11/03/14  Yes Thurnell Lose, MD  glucose blood (ACCU-CHEK SMARTVIEW) test strip 1 each by Other route as needed for other. Use as instructed   Yes Historical Provider, MD  Insulin Glargine (TOUJEO SOLOSTAR) 300 UNIT/ML SOPN Inject 16 Units into the skin every evening.   Yes Historical Provider, MD  isosorbide mononitrate (IMDUR) 60 MG 24 hr tablet TAKE 1 TABLET (60 MG TOTAL) BY MOUTH DAILY. 06/11/15  Yes Leonie Man, MD  levalbuterol Penne Lash) 0.63 MG/3ML nebulizer solution Take 3 mLs (0.63 mg total) by nebulization every 6 (six) hours as needed for wheezing or shortness of breath. 07/01/13  Yes Bonnielee Haff, MD  LORazepam (ATIVAN) 0.5 MG  tablet Take 0.5 mg by mouth 2 (two) times daily.   Yes Historical Provider, MD  meloxicam (MOBIC) 15 MG tablet Take 15 mg by mouth daily.   Yes Historical Provider, MD  Multiple Vitamins-Minerals (MULTIVITAMIN WITH MINERALS) tablet Take 1 tablet by mouth daily.   Yes Historical Provider, MD  nebivolol (BYSTOLIC) 10 MG tablet Take 1 tablet (10 mg total) by mouth daily. 10/16/15  Yes Leonie Man, MD  nitroGLYCERIN (NITROSTAT) 0.4 MG SL tablet Place 1 tablet (0.4 mg total) under the tongue every 5 (five) minutes as needed for chest pain. 01/24/14  Yes Leonie Man, MD  pantoprazole (PROTONIX) 40 MG tablet TAKE 1 TABLET (40 MG TOTAL) BY MOUTH DAILY. 06/11/15  Yes Leonie Man, MD  potassium chloride SA (K-DUR,KLOR-CON) 20 MEQ tablet Take 1 tablet (20 mEq total) by mouth 2 (two) times daily. 06/11/14  Yes Leonie Man, MD  PRADAXA 150 MG CAPS capsule TAKE ONE CAPSULE EVERY 12 HOURS 09/10/15  Yes Leonie Man, MD  predniSONE  (DELTASONE) 10 MG tablet Take 4 tablets (40 mg total) by mouth daily. 05/20/15  Yes Harvel Quale, MD  promethazine (PHENERGAN) 6.25 MG/5ML syrup Take 5 mLs by mouth 4 (four) times daily as needed. cough 05/13/15  Yes Historical Provider, MD  valsartan-hydrochlorothiazide (DIOVAN-HCT) 320-25 MG tablet TAKE 1 TABLET BY MOUTH DAILY. 07/18/15  Yes Leonie Man, MD  ZETIA 10 MG tablet TAKE 1 TABLET (10 MG TOTAL) BY MOUTH DAILY. 03/19/15  Yes Leonie Man, MD   No Known Allergies   Social History   Social History  . Marital Status: Married    Spouse Name: N/A  . Number of Children: 4  . Years of Education: N/A   Social History Main Topics  . Smoking status: Former Smoker -- 0.50 packs/day for 6 years    Types: Cigarettes    Quit date: 05/18/1992  . Smokeless tobacco: Never Used  . Alcohol Use: No     Comment: 07/13/11 "have drank occasionally; not now"  . Drug Use: No  . Sexual Activity: Not Currently   Other Topics Concern  . None   Social History Narrative   She is a married mother of 22, grandmother 2. Usually accompanied by her husband. She does not work. She had been working on her exercise, but is no longer as active. Does not drink and does not smoke   Family History  Problem Relation Age of Onset  . Coronary artery disease Mother   . Hypertension Mother   . Atrial fibrillation Son     Wt Readings from Last 3 Encounters:  11/12/15 243 lb 12.8 oz (110.587 kg)  08/22/15 237 lb (107.502 kg)  01/29/15 231 lb 14.4 oz (105.189 kg)    PHYSICAL EXAM BP 128/80 mmHg  Pulse 56  Ht 5\' 2"  (1.575 m)  Wt 243 lb 12.8 oz (110.587 kg)  BMI 44.58 kg/m2 General appearance: alert, cooperative, appears stated age, no distress and moderately obese HEENT: [place]/AT, EOMI, MMM, anicteric sclera Neck: no adenopathy, no carotid bruit and no JVD Lungs: clear to auscultation bilaterally, normal percussion bilaterally and non-labored Heart: Irregularly irregular rhythm with normal rate; S1,  S2 shenormal, no murmur, click, rub or gallop ; nondisplaced PMI Abdomen: soft, non-tender; bowel sounds normal; no masses, no organomegaly; moderate to morbidly obese Extremities: extremities normal, atraumatic, no cyanosis, and edema 1+ bilaterally Pulses: 2+ and symmetric; Skin: normal  Neurologic: Mental status: Alert, oriented, thought content appropriate Cranial nerves: normal (  II-XII grossly intact)    Adult ECG Report  Rate: 56 ;  Rhythm: atrial fibrillation and Slow ventricular response. Left axis deviation (-65) with LVH. Also mild LVH related IVCD and repolarization changes. Cannot exclude lower infarct, age undetermined with poor R-wave progression.; otherwise normal  Narrative Interpretation: Stable EKG   Other studies Reviewed: Additional studies/ records that were reviewed today include:  Recent Labs:   Lab Results  Component Value Date   CREATININE 1.19* 05/20/2015   Lab Results  Component Value Date   K 3.1* 05/20/2015    ASSESSMENT / PLAN: Problem List Items Addressed This Visit    Sleep apnea, on C-Pap (Chronic)    For some reason, she is not tolerating her CPAP. She may need new settings. This may be contributing to her dyspnea and orthopnea. She needs referral to a sleep medicine physician. We will refer to either Dr. Betti Cruz, first available.      Hypokalemia   Relevant Orders   EKG 12-Lead (Completed)   Lipid panel   Comprehensive metabolic panel   Hyperlipidemia with target LDL less than 70 (Chronic)    Labs being monitored by PCP. She is on Zetia. I don't see that she is on a statin. Unfortunately labs or not available. She has had statin intolerance in the past.  Since I have not seen any recent labs, we will go head in order lipid panel with chemistry panel.. If unable to get her LDL to goal, may need to consider PCSK9 inhibitor.       Relevant Medications   furosemide (LASIX) 80 MG tablet   valsartan-hydrochlorothiazide (DIOVAN-HCT)  320-25 MG tablet   nebivolol (BYSTOLIC) 10 MG tablet   isosorbide mononitrate (IMDUR) 60 MG 24 hr tablet   dabigatran (PRADAXA) 150 MG CAPS capsule   ezetimibe (ZETIA) 10 MG tablet   Other Relevant Orders   EKG 12-Lead (Completed)   Lipid panel   Comprehensive metabolic panel   Essential hypertension (Chronic)    Well-controlled. She is on pretty high doses of Lasix as well as combination Diovan/HCTZ. I not sure how much more benefit regarding with the HCTZ. Would recommend converting to simply Diovan.      Relevant Medications   furosemide (LASIX) 80 MG tablet   valsartan-hydrochlorothiazide (DIOVAN-HCT) 320-25 MG tablet   nebivolol (BYSTOLIC) 10 MG tablet   isosorbide mononitrate (IMDUR) 60 MG 24 hr tablet   dabigatran (PRADAXA) 150 MG CAPS capsule   ezetimibe (ZETIA) 10 MG tablet   Other Relevant Orders   EKG 12-Lead (Completed)   Lipid panel   Comprehensive metabolic panel   Edema of both legs (Chronic)    Probably in some part related to diastolic heart failure, but also probably has some venous stasis. Titrating up Lasix for now.      DOE (dyspnea on exertion) - Primary    Maybe a little more short of breath than baseline. Will increase diuretic. Clearly is multifactorial however with obesity and lack of activity. I suspect a good part of her dyspnea is related to deconditioning and obesity.      Relevant Orders   EKG 12-Lead (Completed)   Lipid panel   Comprehensive metabolic panel   Congestive dilated cardiomyopathy (HCC) (Chronic)    Notable improvement of EF following her hospital stay. On stable dose of beta blocker, increasing Lasix dose was slight scale Lasix. On Diovan for afterload reduction.      Relevant Medications   furosemide (LASIX) 80 MG tablet   valsartan-hydrochlorothiazide (  DIOVAN-HCT) 320-25 MG tablet   nebivolol (BYSTOLIC) 10 MG tablet   isosorbide mononitrate (IMDUR) 60 MG 24 hr tablet   dabigatran (PRADAXA) 150 MG CAPS capsule   ezetimibe  (ZETIA) 10 MG tablet   Other Relevant Orders   EKG 12-Lead (Completed)   Lipid panel   Comprehensive metabolic panel   Chronic diastolic heart failure, NYHA class 2 (HCC) (Chronic)    As her EF is improved back to almost baseline, mostly her symptoms are diastolic heart failure related. Pretty stable, however I think we can probably be a little more aggressive with her diuresis based on her having some orthopnea and dyspnea on exertion as well as edema. Plan: Increase Lasix to 80 mg twice a day with sliding scale Lasix (see patient instructions)      Relevant Medications   furosemide (LASIX) 80 MG tablet   valsartan-hydrochlorothiazide (DIOVAN-HCT) 320-25 MG tablet   nebivolol (BYSTOLIC) 10 MG tablet   isosorbide mononitrate (IMDUR) 60 MG 24 hr tablet   dabigatran (PRADAXA) 150 MG CAPS capsule   ezetimibe (ZETIA) 10 MG tablet   Other Relevant Orders   EKG 12-Lead (Completed)   Lipid panel   Comprehensive metabolic panel   CAD S/P percutaneous coronary angioplasty (Chronic)    None patent stents as of 2013 nonobstructive disease otherwise. No active anginal symptoms. Negative Myoview in follow-up. Continue beta blocker and ARB. Also on long-acting nitrate. Not on aspirin while on Pradaxa.      Relevant Medications   furosemide (LASIX) 80 MG tablet   valsartan-hydrochlorothiazide (DIOVAN-HCT) 320-25 MG tablet   nebivolol (BYSTOLIC) 10 MG tablet   isosorbide mononitrate (IMDUR) 60 MG 24 hr tablet   dabigatran (PRADAXA) 150 MG CAPS capsule   ezetimibe (ZETIA) 10 MG tablet   Atrial fibrillation, permanent (HCC) (Chronic)    Stable, rate controlled A. fib on beta blocker. No symptoms to suggest rapid rates. Continue current dose of beta blocker for rate control. This patients CHA2DS2-VASc Score and unadjusted Ischemic Stroke Rate (% per year) is equal to 9.7 % stroke rate/year from a score of 6 --> stable on Pradaxa with no significant bleeding. *Above score calculated as 1 point each  if present [CHF, HTN, DM, Vascular=MI/PAD/Aortic Plaque, Age if 11-74, or Female]       Relevant Medications   furosemide (LASIX) 80 MG tablet   valsartan-hydrochlorothiazide (DIOVAN-HCT) 320-25 MG tablet   nebivolol (BYSTOLIC) 10 MG tablet   isosorbide mononitrate (IMDUR) 60 MG 24 hr tablet   dabigatran (PRADAXA) 150 MG CAPS capsule   ezetimibe (ZETIA) 10 MG tablet      Current medicines are reviewed at length with the patient today. (+/- concerns) unsure of Lasix dosing The following changes have been made:  Change Lasix to 80 mg BID -   Sliding scale Lasix: Weigh yourself when you get home, then Daily in the Morning. Your dry weight will be what your scale says on the day you return home.(here is 243 lbs.).   If you gain more than 3 pounds from dry weight: Increase the Lasix dosing to AN EXTRA DOSE 20 mg( TOTAL 100 MG) in the morning and 20 mg (TOTAL 100 MG) in the afternoon until weight returns to baseline dry weight.  If weight gain is greater than 5 pounds in 2 days: Increased to Lasix 160 mg (2 TABLET OF 80 MG FOR MORNING DOSE AND 80 MG IN THE EVENING .and contact the office for further assistance if weight does not go down the next day.  If the weight goes down more than 3 pounds from dry weight: Hold Lasix until it returns to baseline dry weight  REFERRAL FOR DR Claiborne Billings OR [facility] - SLEEP CLINIC - PATIENT HAS SLEEP APNEA  Your physician wants you to follow-up in:  OCT OR NOV 2017   WITH DR Katerra Ingman--30 MIN   Studies Ordered:   Orders Placed This Encounter  Procedures  . Lipid panel  . Comprehensive metabolic panel  . EKG 12-Lead      Glenetta Hew, M.D., M.S. Interventional Cardiologist   Pager # [phone] Phone # [phone] [address] [place],  [address]

## 2015-11-14 ENCOUNTER — Encounter: Payer: Self-pay | Admitting: Cardiology

## 2015-11-14 LAB — LIPID PANEL
CHOL/HDL RATIO: 4.2 ratio (ref <=5.0)
Cholesterol: 146 mg/dL (ref 125–200)
HDL: 35 mg/dL — ABNORMAL LOW (ref >=46)
LDL CALC: 89 mg/dL (ref <130)
TRIGLYCERIDES: 110 mg/dL (ref <150)
VLDL: 22 mg/dL (ref <30)

## 2015-11-14 LAB — COMPREHENSIVE METABOLIC PANEL
ALK PHOS: 60 U/L (ref 33–130)
ALT: 17 U/L (ref 6–29)
AST: 31 U/L (ref 10–35)
Albumin: 3.8 g/dL (ref 3.6–5.1)
BUN: 36 mg/dL — AB (ref 7–25)
CALCIUM: 9.5 mg/dL (ref 8.6–10.4)
CHLORIDE: 101 mmol/L (ref 98–110)
CO2: 24 mmol/L (ref 20–31)
Creat: 1.23 mg/dL — ABNORMAL HIGH (ref 0.50–0.99)
Glucose, Bld: 104 mg/dL — ABNORMAL HIGH (ref 65–99)
POTASSIUM: 3.5 mmol/L (ref 3.5–5.3)
Sodium: 142 mmol/L (ref 135–146)
Total Bilirubin: 0.6 mg/dL (ref 0.2–1.2)
Total Protein: 6.8 g/dL (ref 6.1–8.1)

## 2015-11-14 NOTE — Assessment & Plan Note (Signed)
Well-controlled. She is on pretty high doses of Lasix as well as combination Diovan/HCTZ. I not sure how much more benefit regarding with the HCTZ. Would recommend converting to simply Diovan.

## 2015-11-14 NOTE — Assessment & Plan Note (Signed)
As her EF is improved back to almost baseline, mostly her symptoms are diastolic heart failure related. Pretty stable, however I think we can probably be a little more aggressive with her diuresis based on her having some orthopnea and dyspnea on exertion as well as edema. Plan: Increase Lasix to 80 mg twice a day with sliding scale Lasix (see patient instructions)

## 2015-11-14 NOTE — Assessment & Plan Note (Signed)
Maybe a little more short of breath than baseline. Will increase diuretic. Clearly is multifactorial however with obesity and lack of activity. I suspect a good part of her dyspnea is related to deconditioning and obesity.

## 2015-11-14 NOTE — Assessment & Plan Note (Signed)
None patent stents as of 2013 nonobstructive disease otherwise. No active anginal symptoms. Negative Myoview in follow-up. Continue beta blocker and ARB. Also on long-acting nitrate. Not on aspirin while on Pradaxa.

## 2015-11-14 NOTE — Assessment & Plan Note (Signed)
Notable improvement of EF following her hospital stay. On stable dose of beta blocker, increasing Lasix dose was slight scale Lasix. On Diovan for afterload reduction.

## 2015-11-14 NOTE — Assessment & Plan Note (Addendum)
Stable, rate controlled A. fib on beta blocker. No symptoms to suggest rapid rates. Continue current dose of beta blocker for rate control. This patients CHA2DS2-VASc Score and unadjusted Ischemic Stroke Rate (% per year) is equal to 9.7 % stroke rate/year from a score of 6 --> stable on Pradaxa with no significant bleeding. *Above score calculated as 1 point each if present [CHF, HTN, DM, Vascular=MI/PAD/Aortic Plaque, Age if 68-74, or Female]

## 2015-11-14 NOTE — Assessment & Plan Note (Signed)
For some reason, she is not tolerating her CPAP. She may need new settings. This may be contributing to her dyspnea and orthopnea. She needs referral to a sleep medicine physician. We will refer to either Dr. Betti Cruz, first available.

## 2015-11-14 NOTE — Assessment & Plan Note (Signed)
Probably in some part related to diastolic heart failure, but also probably has some venous stasis. Titrating up Lasix for now.

## 2015-11-14 NOTE — Assessment & Plan Note (Addendum)
Labs being monitored by PCP. She is on Zetia. I don't see that she is on a statin. Unfortunately labs or not available. She has had statin intolerance in the past.  Since I have not seen any recent labs, we will go head in order lipid panel with chemistry panel.. If unable to get her LDL to goal, may need to consider PCSK9 inhibitor.

## 2015-11-15 ENCOUNTER — Telehealth: Payer: Self-pay | Admitting: *Deleted

## 2015-11-15 DIAGNOSIS — I5032 Chronic diastolic (congestive) heart failure: Secondary | ICD-10-CM

## 2015-11-15 DIAGNOSIS — I420 Dilated cardiomyopathy: Secondary | ICD-10-CM

## 2015-11-15 DIAGNOSIS — Z79899 Other long term (current) drug therapy: Secondary | ICD-10-CM

## 2015-11-15 DIAGNOSIS — E785 Hyperlipidemia, unspecified: Secondary | ICD-10-CM

## 2015-11-15 NOTE — Telephone Encounter (Signed)
-----   Message from Leonie Man, MD sent at 11/15/2015  7:06 AM EDT ----- Cholesterol levels look ~OK, but not as good as last year.  Need to watch diet closely.   Recheck in 6 months. Chemistries look good.  Kidney function is a bit low, but stable. - should recheck in 2 weeks since we increased Lasix. Liver function is normal.    Glenetta Hew, MD

## 2015-11-15 NOTE — Telephone Encounter (Signed)
Spoke to patient. Result given . Verbalized understanding Mailed lab slip for 2 week BMP WILL mail lab slip in NOV 2017

## 2015-12-07 LAB — BASIC METABOLIC PANEL
BUN: 36 mg/dL — AB (ref 7–25)
CALCIUM: 9.6 mg/dL (ref 8.6–10.4)
CO2: 28 mmol/L (ref 20–31)
CREATININE: 1.4 mg/dL — AB (ref 0.50–0.99)
Chloride: 101 mmol/L (ref 98–110)
GLUCOSE: 113 mg/dL — AB (ref 65–99)
POTASSIUM: 3.7 mmol/L (ref 3.5–5.3)
Sodium: 143 mmol/L (ref 135–146)

## 2015-12-10 ENCOUNTER — Other Ambulatory Visit: Payer: Self-pay | Admitting: Cardiology

## 2015-12-20 ENCOUNTER — Other Ambulatory Visit: Payer: Self-pay | Admitting: Family Medicine

## 2015-12-20 ENCOUNTER — Ambulatory Visit
Admission: RE | Admit: 2015-12-20 | Discharge: 2015-12-20 | Disposition: A | Discharge status: Home / Self Care | Payer: Medicare Other | Source: Ambulatory Visit | Attending: Family Medicine | Admitting: Family Medicine

## 2015-12-20 DIAGNOSIS — M25551 Pain in right hip: Secondary | ICD-10-CM

## 2016-01-23 ENCOUNTER — Ambulatory Visit: Payer: Medicare Other | Admitting: Cardiovascular Disease

## 2016-03-10 ENCOUNTER — Other Ambulatory Visit: Payer: Self-pay | Admitting: Cardiology

## 2016-04-08 ENCOUNTER — Telehealth: Payer: Self-pay | Admitting: *Deleted

## 2016-04-08 DIAGNOSIS — I420 Dilated cardiomyopathy: Secondary | ICD-10-CM

## 2016-04-08 DIAGNOSIS — E785 Hyperlipidemia, unspecified: Secondary | ICD-10-CM

## 2016-04-08 DIAGNOSIS — Z79899 Other long term (current) drug therapy: Secondary | ICD-10-CM

## 2016-04-08 DIAGNOSIS — I5032 Chronic diastolic (congestive) heart failure: Secondary | ICD-10-CM

## 2016-04-08 NOTE — Telephone Encounter (Signed)
Mail letter and labslip  

## 2016-04-08 NOTE — Telephone Encounter (Signed)
-----   Message from Raiford Simmonds, RN sent at 11/15/2015  3:24 PM EDT ----- MAIL LAB SLIP   CMP LIPID DUE BY 05/16/16

## 2016-04-27 ENCOUNTER — Other Ambulatory Visit: Payer: Self-pay | Admitting: Cardiology

## 2016-05-15 LAB — COMPREHENSIVE METABOLIC PANEL
ALBUMIN: 4.2 g/dL (ref 3.6–5.1)
ALT: 21 U/L (ref 6–29)
AST: 27 U/L (ref 10–35)
Alkaline Phosphatase: 76 U/L (ref 33–130)
BILIRUBIN TOTAL: 0.6 mg/dL (ref 0.2–1.2)
BUN: 54 mg/dL — ABNORMAL HIGH (ref 7–25)
CALCIUM: 9.8 mg/dL (ref 8.6–10.4)
CHLORIDE: 104 mmol/L (ref 98–110)
CO2: 23 mmol/L (ref 20–31)
Creat: 1.48 mg/dL — ABNORMAL HIGH (ref 0.50–0.99)
Glucose, Bld: 144 mg/dL — ABNORMAL HIGH (ref 65–99)
Potassium: 3.5 mmol/L (ref 3.5–5.3)
Sodium: 142 mmol/L (ref 135–146)
TOTAL PROTEIN: 7.2 g/dL (ref 6.1–8.1)

## 2016-05-15 LAB — LIPID PANEL
CHOLESTEROL: 182 mg/dL (ref <200)
HDL: 32 mg/dL — AB (ref >50)
LDL CALC: 118 mg/dL — AB (ref <100)
TRIGLYCERIDES: 158 mg/dL — AB (ref <150)
Total CHOL/HDL Ratio: 5.7 Ratio — ABNORMAL HIGH (ref <5.0)
VLDL: 32 mg/dL — ABNORMAL HIGH (ref <30)

## 2016-05-18 DIAGNOSIS — J189 Pneumonia, unspecified organism: Secondary | ICD-10-CM

## 2016-05-18 HISTORY — DX: Pneumonia, unspecified organism: J18.9

## 2016-06-05 ENCOUNTER — Telehealth: Payer: Self-pay | Admitting: *Deleted

## 2016-06-05 NOTE — Telephone Encounter (Signed)
Returning your call. °

## 2016-06-05 NOTE — Telephone Encounter (Signed)
-----   Message from Leonie Man, MD sent at 06/03/2016  4:32 PM EST ----- Lab results:. Chemistry panel shows stable electrolytes, but moderately reduced kidney function that is stable. Normal liver function. Cholesterol level has gone the wrong direction from 4 years ago being off of the statin.  Will need to set up for [facility] clinic to discuss PSCK9 inhibitor  Glenetta Hew, MD

## 2016-06-05 NOTE — Telephone Encounter (Signed)
Left message for patient to cal back -  1--needs appointment for march 2018 missed recall 2- does she want to wait to see dr harding first or schedule [facility] appointment

## 2016-06-08 NOTE — Telephone Encounter (Signed)
F/u message ° °Pt returning RN call. Please call back to discuss  °

## 2016-06-09 NOTE — Telephone Encounter (Signed)
SPOKE WITH PATIENT   SHE STATES SHE WOULD LIKE AN APPOINTMENT WITH DR HARDING FIRST BEFORE SEEING [facility].  APPOINTMENT SCHEDULE FOR 07/16/16  VOICED UNDERSTANDING.

## 2016-06-14 ENCOUNTER — Other Ambulatory Visit: Payer: Self-pay | Admitting: Cardiology

## 2016-07-16 ENCOUNTER — Ambulatory Visit (INDEPENDENT_AMBULATORY_CARE_PROVIDER_SITE_OTHER): Payer: Medicare Other | Admitting: Cardiology

## 2016-07-16 VITALS — BP 124/86 | HR 58 | Ht 64.0 in | Wt 243.0 lb

## 2016-07-16 DIAGNOSIS — I110 Hypertensive heart disease with heart failure: Secondary | ICD-10-CM | POA: Diagnosis not present

## 2016-07-16 DIAGNOSIS — R600 Localized edema: Secondary | ICD-10-CM | POA: Diagnosis not present

## 2016-07-16 DIAGNOSIS — I10 Essential (primary) hypertension: Secondary | ICD-10-CM

## 2016-07-16 DIAGNOSIS — Z9861 Coronary angioplasty status: Secondary | ICD-10-CM

## 2016-07-16 DIAGNOSIS — I482 Chronic atrial fibrillation: Secondary | ICD-10-CM

## 2016-07-16 DIAGNOSIS — I4821 Permanent atrial fibrillation: Secondary | ICD-10-CM

## 2016-07-16 DIAGNOSIS — I2510 Atherosclerotic heart disease of native coronary artery without angina pectoris: Secondary | ICD-10-CM | POA: Diagnosis not present

## 2016-07-16 DIAGNOSIS — I420 Dilated cardiomyopathy: Secondary | ICD-10-CM | POA: Diagnosis not present

## 2016-07-16 DIAGNOSIS — I5032 Chronic diastolic (congestive) heart failure: Secondary | ICD-10-CM | POA: Diagnosis not present

## 2016-07-16 DIAGNOSIS — E785 Hyperlipidemia, unspecified: Secondary | ICD-10-CM | POA: Diagnosis not present

## 2016-07-16 MED ORDER — ATORVASTATIN CALCIUM 40 MG PO TABS: 40.0000 mg | ORAL_TABLET | Freq: Every day | ORAL | 11 refills | Status: DC

## 2016-07-16 MED ORDER — VALSARTAN-HYDROCHLOROTHIAZIDE 320-25 MG PO TABS: 1.0000 | ORAL_TABLET | Freq: Every day | ORAL | 11 refills | Status: DC

## 2016-07-16 NOTE — Patient Instructions (Addendum)
medication change    Decrease furosemide ( lasix) one tablet daily -- may take an extra tablet if wight gain is more than 3 lbs, overnight or shortness of breathe.  Weight daily   Start atorvastatin 40 mg  --one tablet at bedtime for cholesterol Will recheck lab in 3 months . Will mail you lab slip in abut 2 months  Your physician wants you to follow-up in [place] HARDINGYou will receive a reminder letter in the mail two months in advance. If you don't receive a letter, please call our office to schedule the follow-up appointment.   If you need a refill on your cardiac medications before your next appointment, please call your pharmacy.

## 2016-07-16 NOTE — Progress Notes (Signed)
PCP: Kristine Garbe, MD  Clinic Note: Chief Complaint  Patient presents with  . Follow-up    HF - EF ~45%  . Atrial Fibrillation  . Coronary Artery Disease    HPI: Stephanie Franco is a 66 y.o. female with a PMH below who presents today for six-month follow-up. Stephanie Franco's history of chronic atrial fibrillation with cardiomyopathy saw her EF down to 20% in setting of Escherichia coli sepsis with pyelonephritis. Most recent echo from September 2016 showed improved EF up to 40 and 45%.  Stephanie Franco was last seen in June 2017.  Recent Hospitalizations: None  Studies Reviewed: None  Interval History: Stephanie Franco presents today feeling fairly well. Her husband says that she seems to be doing the best she has been doing for a while. She is using a new CPAP machine and has noticed that her dyspnea has improved. While I saw her we had increased her Lasix dose and she sometimes feels a little bit dry. If he can reduce it. She is definitely now trying to watch her diet, but is not really doing any exercise because she is "lazy". She really only notes that her heart rate goes up when she is under stress or anxiety. Usually with routine activities is fine and she does notice a going fast. She really doesn'tany significant PND or orthopnea with still mild edema in the day, but we increased her diuretic dose. She denies any chest tightness pressure with rest or exertion, but as usual she still has some exertional dyspnea.  No dizziness, weakness or syncope/near syncope. No TIA/amaurosis fugax symptoms. No melena, hematochezia, hematuria, or epstaxis. No claudication.  ROS: A comprehensive was performed. Review of Systems  Constitutional: Positive for malaise/fatigue (Somewhat lazy more so than in the office).  HENT: Negative for hearing loss.   Respiratory: Negative for cough and wheezing.   Musculoskeletal: Positive for joint pain.  Skin: Negative.   Neurological: Positive for dizziness  (Sometimes positional).  Endo/Heme/Allergies: Bruises/bleeds easily.  Psychiatric/Behavioral: Negative for memory loss. The patient is not nervous/anxious and does not have insomnia.   All other systems reviewed and are negative.   Past Medical History:  Diagnosis Date  . Asthma   . Atrial fibrillation, permanent ([place])    Rate control with Bystolic. CHA2DS2Vasc = 6 (HTN, DM, CHF, Age 24, Female) -> on Pradaxa  . Breast cancer ([place]) 2008   S/P mastectomy  . CAD S/P percutaneous coronary angioplasty 2006;    PCI of circumflex with Taxus DES;; relook-cath Feb 2013: 50-60% short lesion in RCA, 40% ISR Circumflex stent.  . CKD (chronic kidney disease), stage III   . Diabetes mellitus, uncontrolled 07/14/2011  . Dilated cardiomyopathy ([place])    Severely depressed LV systolic function with EF of 25% range. Very limited study. Diffuse hypokinesis.  . Edema of both legs    Usually mild, chronic. Controlled with when necessary furosemide and diet  . 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 Gout   . History of echocardiogram 07/22/2011  . Hyperlipidemia   . Hypertension   . Morbid obesity (HCC)    BMI 41  . OSA on CPAP    On CPAP  . Stented coronary artery    40% ISR in Circumflex stent    Past Surgical History:  Procedure Laterality Date  . CORONARY ANGIOPLASTY WITH STENT PLACEMENT  2006   stent to circumflex  . LEFT HEART CATHETERIZATION WITH CORONARY ANGIOGRAM N/A 07/13/2011   Procedure: LEFT HEART CATHETERIZATION  WITH CORONARY ANGIOGRAM;  Surgeon: Leonie Man, MD;  Location: [facility] CATH LAB;  Service: Cardiovascular;  normal EF; 60mm 50-60% lesion in RCA; patent stent in circumflex form 2006 with ~40% in-stent re-stenosis  . MASTECTOMY  2008   left  . NM MYOVIEW LTD  11/22/2014   Low Risk. No ischemia or infarction. Not gated 2/2  A. fib - unable to assess EF  . TRANSTHORACIC ECHOCARDIOGRAM  March 2013   LV borderline dilated; borderline concentric LVH; EF 43-32%; RV sysolic  function mildly reduced; LA severely dilated; RA mod-severely dilated; mild-mod MR; mild TR; mod pulm HTN;   . TRANSTHORACIC ECHOCARDIOGRAM  June 2015   In setting of Urosepsis: EF ~25 %, global HK (poor quality study)    Current Meds  Medication Sig  . albuterol (PROVENTIL HFA;VENTOLIN HFA) 108 (90 Base) MCG/ACT inhaler Inhale 1-2 puffs into the lungs every 6 (six) hours as needed for wheezing or shortness of breath.  Marland Kitchen albuterol (PROVENTIL) (2.5 MG/3ML) 0.083% nebulizer solution Take 3 mLs (2.5 mg total) by nebulization every 6 (six) hours as needed for wheezing or shortness of breath.  . allopurinol (ZYLOPRIM) 300 MG tablet Take 300 mg by mouth daily as needed (for gout flares). Gout  . cholecalciferol (VITAMIN D) 1000 UNITS tablet Take 2,000 Units by mouth daily.  . colchicine 0.6 MG tablet Take 0.6 mg by mouth daily as needed. Take 2 tablets intially, then 1 tablet in 2 hours , Then 1 tablet daily For gout  . diclofenac (VOLTAREN) 75 MG EC tablet Take 1 tablet by mouth 2 (two) times daily.  Marland Kitchen ELIQUIS 5 MG TABS tablet Take 1 tablet by mouth 2 (two) times daily.  Marland Kitchen ezetimibe (ZETIA) 10 MG tablet TAKE 1 TABLET (10 MG TOTAL) BY MOUTH DAILY.  . ferrous sulfate 325 (65 FE) MG EC tablet Take 325 mg by mouth daily with breakfast.  . fexofenadine (ALLEGRA) 180 MG tablet Take 180 mg by mouth daily as needed for allergies.   . fluticasone (FLONASE) 50 MCG/ACT nasal spray Place 2 sprays into the nose as needed for rhinitis.  . furosemide (LASIX) 80 MG tablet Take 1 tablet (80 mg total) by mouth 2 (two) times daily.  Marland Kitchen glucose blood (ACCU-CHEK SMARTVIEW) test strip 1 each by Other route as needed for other. Use as instructed  . Insulin Glargine (TOUJEO SOLOSTAR) 300 UNIT/ML SOPN Inject 16 Units into the skin every evening.  . isosorbide mononitrate (IMDUR) 60 MG 24 hr tablet TAKE 1 TABLET (60 MG TOTAL) BY MOUTH DAILY.  Marland Kitchen levalbuterol (XOPENEX) 0.63 MG/3ML nebulizer solution Take 3 mLs (0.63 mg  total) by nebulization every 6 (six) hours as needed for wheezing or shortness of breath.  . lidocaine (LIDODERM) 5 % Place 1 patch onto the skin as needed.  Marland Kitchen LORazepam (ATIVAN) 1 MG tablet Take 1 mg by mouth daily.  . Multiple Vitamins-Minerals (MULTIVITAMIN WITH MINERALS) tablet Take 1 tablet by mouth daily.  . nebivolol (BYSTOLIC) 10 MG tablet Take 1 tablet (10 mg total) by mouth daily.  Marland Kitchen NITROSTAT 0.4 MG SL tablet DISSOLVE 1 TABLET (0.4 MG TOTAL) UNDER THE TONGUE EVERY 5 (FIVE) MINUTES AS NEEDED FOR CHEST PAIN.  Marland Kitchen pantoprazole (PROTONIX) 40 MG tablet TAKE 1 TABLET (40 MG TOTAL) BY MOUTH DAILY.  Marland Kitchen potassium chloride SA (K-DUR,KLOR-CON) 20 MEQ tablet Take 1 tablet (20 mEq total) by mouth 2 (two) times daily.  . valsartan-hydrochlorothiazide (DIOVAN-HCT) 320-25 MG tablet Take 1 tablet by mouth daily.  . [DISCONTINUED] valsartan-hydrochlorothiazide (  DIOVAN-HCT) 320-25 MG tablet Take 1 tablet by mouth daily.    No Known Allergies  Social History   Social History  . Marital status: Married    Spouse name: N/A  . Number of children: 4  . Years of education: N/A   Social History Main Topics  . Smoking status: Former Smoker    Packs/day: 0.50    Years: 6.00    Types: Cigarettes    Quit date: 05/18/1992  . Smokeless tobacco: Never Used  . Alcohol use No     Comment: 07/13/11 "have drank occasionally; not now"  . Drug use: No  . Sexual activity: Not Currently   Other Topics Concern  . None   Social History Narrative   She is a married mother of 74, grandmother 2. Usually accompanied by her husband. She does not work. She had been working on her exercise, but is no longer as active. Does not drink and does not smoke    family history includes Atrial fibrillation in her son; Coronary artery disease in her mother; Hypertension in her mother.  Wt Readings from Last 3 Encounters:  07/16/16 110.2 kg (243 lb)  11/12/15 110.6 kg (243 lb 12.8 oz)  08/22/15 107.5 kg (237 lb)    PHYSICAL  EXAM BP 124/86   Pulse (!) 58   Ht 5\' 4"  (1.626 m)   Wt 110.2 kg (243 lb)   BMI 41.71 kg/m  General appearance: alert, cooperative, appears stated age, no distress and moderately obese HEENT: [place]/AT, EOMI, MMM, anicteric sclera Neck: no adenopathy, no carotid bruit and no JVD Lungs: clear to auscultation bilaterally, normal percussion bilaterally and non-labored Heart: Irregularly irregular rhythm with normal rate; S1 & S2 normal, no murmur, click, rub or gallop ; nondisplaced PMI Abdomen: soft, non-tender; bowel sounds normal; no masses, no organomegaly; moderate to morbidly obese Extremities: extremities normal, atraumatic, no cyanosis, and pedal edema 1+ bilaterally Pulses: 2+ and symmetric; Skin: normal  Neurologic: Mental status: Alert, oriented, thought content appropriate     Adult ECG Report  Rate: 58 ;  Rhythm: atrial fibrillation and X deviation (D1). LVH with QRS changes in lateral T-wave inversions.;   Narrative Interpretation: stable EKG   Other studies Reviewed: Additional studies/ records that were reviewed today include:  Recent Labs:   Lab Results  Component Value Date   CHOL 182 05/15/2016   HDL 32 (L) 05/15/2016   LDLCALC 118 (H) 05/15/2016   TRIG 158 (H) 05/15/2016   CHOLHDL 5.7 (H) 05/15/2016   Lab Results  Component Value Date   CREATININE 1.48 (H) 05/15/2016   BUN 54 (H) 05/15/2016   NA 142 05/15/2016   K 3.5 05/15/2016   CL 104 05/15/2016   CO2 23 05/15/2016     ASSESSMENT / PLAN: Problem List Items Addressed This Visit    Atrial fibrillation, permanent ([address]); CHA2DS2Vasc - 6 (Chronic)    Stable with rate controlled A. fib on beta blocker and no bleeding issues with ELIQUIS.      Relevant Medications   ELIQUIS 5 MG TABS tablet   valsartan-hydrochlorothiazide (DIOVAN-HCT) 320-25 MG tablet   atorvastatin (LIPITOR) 40 MG tablet   CAD S/P percutaneous coronary angioplasty (Chronic)    Patent stents as of 2013. Otherwise nonobstructive  disease. Follow-up Myoview is negative. She is on Imdur along with Bystolic and valsartan-HCTZ.  Not on antiplatelet is because of ELIQUIS. Her lipids were not quite under control. We are starting statin today.      Relevant Medications  ELIQUIS 5 MG TABS tablet   valsartan-hydrochlorothiazide (DIOVAN-HCT) 320-25 MG tablet   atorvastatin (LIPITOR) 40 MG tablet   Other Relevant Orders   EKG 12-Lead (Completed)   Lipid panel   Comprehensive metabolic panel   Chronic diastolic heart failure, NYHA class 2 (HCC) - Primary (Chronic)    EF almost back to baseline. Most recent is related to diastolic heart failure. She is doing well from a symptom standpoint and is on a good regimen of beta blocker and ARB. We had increased her Lasix to twice a day with sliding scale last visit, I think we can go back to once daily and continue sliding scale, she actually needs a second dose.      Relevant Medications   ELIQUIS 5 MG TABS tablet   valsartan-hydrochlorothiazide (DIOVAN-HCT) 320-25 MG tablet   atorvastatin (LIPITOR) 40 MG tablet   Other Relevant Orders   EKG 12-Lead (Completed)   Lipid panel   Comprehensive metabolic panel   Congestive dilated cardiomyopathy (HCC) (Chronic)    Notable improvement of her EF after her illness. She is on stable regimen. For now I think we can just hold off on further evaluation unless symptoms change.      Relevant Medications   ELIQUIS 5 MG TABS tablet   valsartan-hydrochlorothiazide (DIOVAN-HCT) 320-25 MG tablet   atorvastatin (LIPITOR) 40 MG tablet   Edema of both legs (Chronic)    Improved with increased dose of Lasix. Talked about his support stockings as well. Now with a granular back down on Lasix and use when necessary dosing.      Essential hypertension (Chronic)    Well-controlled on current dose of beta blocker and ARB-HCTZ.      Relevant Medications   ELIQUIS 5 MG TABS tablet   valsartan-hydrochlorothiazide (DIOVAN-HCT) 320-25 MG tablet    atorvastatin (LIPITOR) 40 MG tablet   Other Relevant Orders   EKG 12-Lead (Completed)   Lipid panel   Comprehensive metabolic panel   Hyperlipidemia with target LDL less than 70 (Chronic)    Last labs clearly not at goal with LDL of 118. In that light, we will need to restart statin. She is on Zetia, we will start atorvastatin 40 mg daily. Recheck lipids in roughly 3 months.      Relevant Medications   ELIQUIS 5 MG TABS tablet   valsartan-hydrochlorothiazide (DIOVAN-HCT) 320-25 MG tablet   atorvastatin (LIPITOR) 40 MG tablet   Other Relevant Orders   EKG 12-Lead (Completed)   Lipid panel   Comprehensive metabolic panel   Severe obesity (BMI >= 40) (HCC) (Chronic)    Still having a hard time losing weight with a BMI of almost 42. Unfortunately she doesn't really want to exercise and therefore she probably won't lose weight despite her attempts at dietary modification.         Current medicines are reviewed at length with the patient today. (+/- concerns) ? Can she reduce dosing of lasix. The following changes have been made: --   Patient Instructions  medication change    Decrease furosemide ( lasix) one tablet daily -- may take an extra tablet if wight gain is more than 3 lbs, overnight or shortness of breathe.  Weight daily   Start atorvastatin 40 mg  --one tablet at bedtime for cholesterol Will recheck lab in 3 months . Will mail you lab slip in abut 2 months  Your physician wants you to follow-up in [place]INGYou will receive a reminder letter in the mail  two months in advance. If you don't receive a letter, please call our office to schedule the follow-up appointment.   If you need a refill on your cardiac medications before your next appointment, please call your pharmacy.    Studies Ordered:   Orders Placed This Encounter  Procedures  . Lipid panel  . Comprehensive metabolic panel  . EKG 12-Lead      Glenetta Hew, M.D.,  M.S. Interventional Cardiologist   Pager # [phone] Phone # [phone] [address] [place], [place] [address]

## 2016-07-20 ENCOUNTER — Encounter: Payer: Self-pay | Admitting: Cardiology

## 2016-07-20 NOTE — Assessment & Plan Note (Signed)
Improved with increased dose of Lasix. Talked about his support stockings as well. Now with a granular back down on Lasix and use when necessary dosing.

## 2016-07-20 NOTE — Assessment & Plan Note (Signed)
EF almost back to baseline. Most recent is related to diastolic heart failure. She is doing well from a symptom standpoint and is on a good regimen of beta blocker and ARB. We had increased her Lasix to twice a day with sliding scale last visit, I think we can go back to once daily and continue sliding scale, she actually needs a second dose.

## 2016-07-20 NOTE — Assessment & Plan Note (Addendum)
Last labs clearly not at goal with LDL of 118. In that light, we will need to restart statin. She is on Zetia, we will start atorvastatin 40 mg daily. Recheck lipids in roughly 3 months.

## 2016-07-20 NOTE — Assessment & Plan Note (Signed)
Well-controlled on current dose of beta blocker and ARB-HCTZ.

## 2016-07-20 NOTE — Assessment & Plan Note (Signed)
Still having a hard time losing weight with a BMI of almost 42. Unfortunately she doesn't really want to exercise and therefore she probably won't lose weight despite her attempts at dietary modification.

## 2016-07-20 NOTE — Assessment & Plan Note (Signed)
Notable improvement of her EF after her illness. She is on stable regimen. For now I think we can just hold off on further evaluation unless symptoms change.

## 2016-07-20 NOTE — Assessment & Plan Note (Signed)
Patent stents as of 2013. Otherwise nonobstructive disease. Follow-up Myoview is negative. She is on Imdur along with Bystolic and valsartan-HCTZ.  Not on antiplatelet is because of ELIQUIS. Her lipids were not quite under control. We are starting statin today.

## 2016-07-20 NOTE — Assessment & Plan Note (Signed)
Stable with rate controlled A. fib on beta blocker and no bleeding issues with ELIQUIS.

## 2016-09-03 IMAGING — CR DG HIP (WITH OR WITHOUT PELVIS) 2-3V*R*
2 series · 2 of 2 positions shown · non-contrast
Comparison: None.

CLINICAL DATA: Right hip pain

EXAM:
DG HIP (WITH OR WITHOUT PELVIS) 2-3V RIGHT

[w pelvis *]
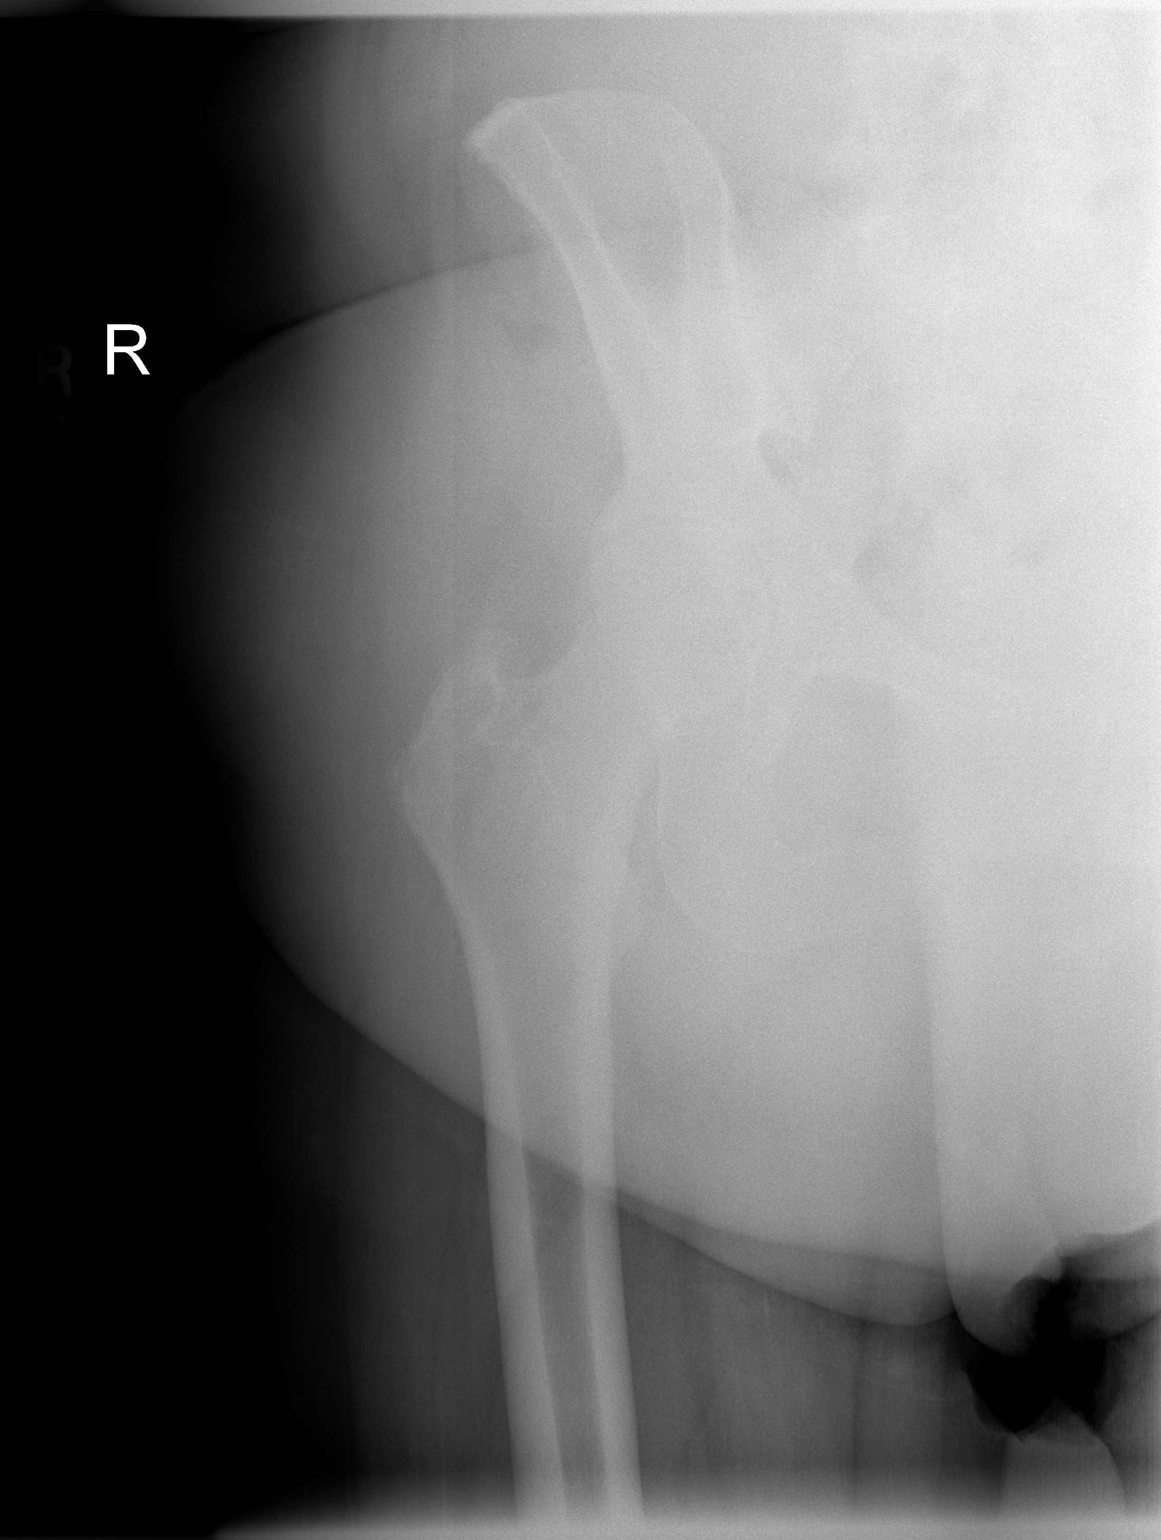

[t hip frog leg right]
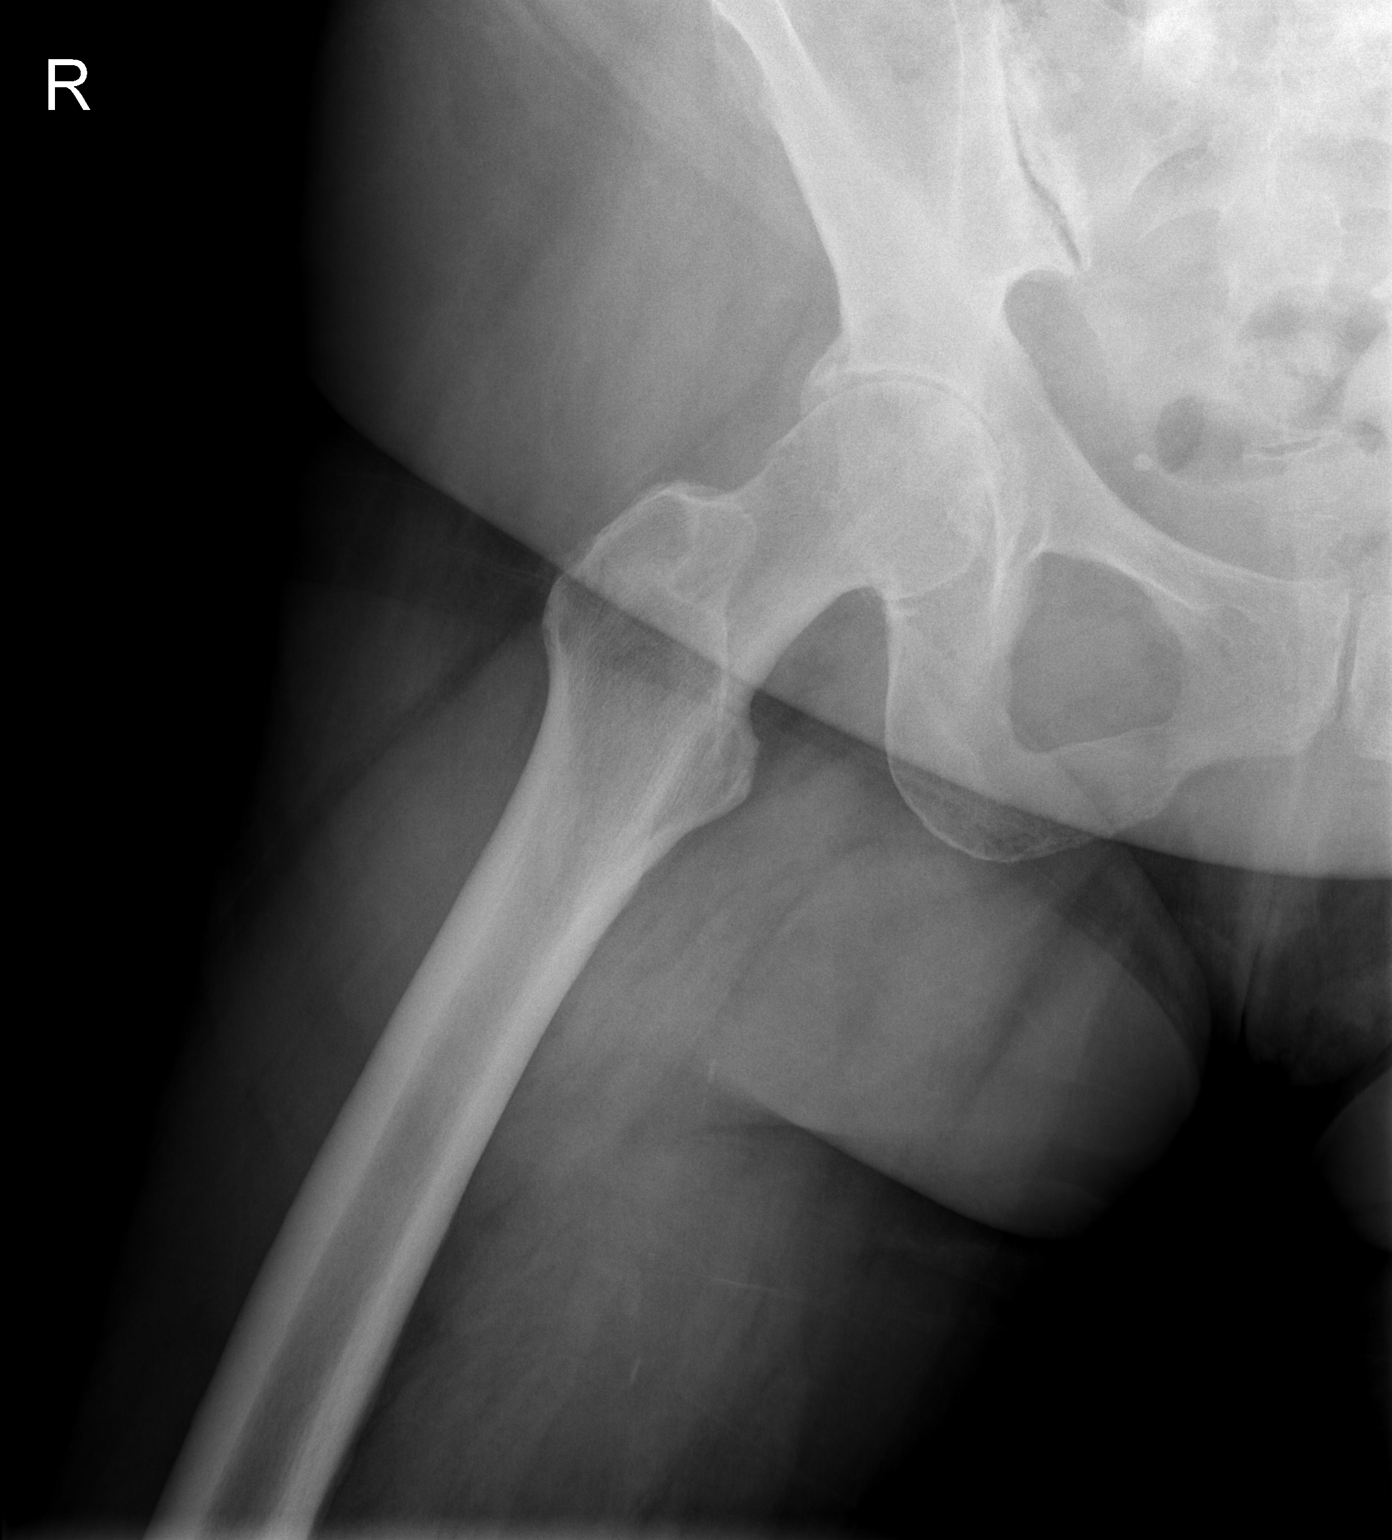

[2 of 2 positions shown; findings below may reference images not displayed]

FINDINGS: Moderate degenerative change in the hip joint with joint space
narrowing and mild spurring. No fracture or collapse of the femoral
head.
IMPRESSION: Moderate degenerative change right hip joint.

## 2016-09-11 ENCOUNTER — Telehealth: Payer: Self-pay | Admitting: *Deleted

## 2016-09-11 DIAGNOSIS — I10 Essential (primary) hypertension: Secondary | ICD-10-CM

## 2016-09-11 DIAGNOSIS — Z9861 Coronary angioplasty status: Secondary | ICD-10-CM

## 2016-09-11 DIAGNOSIS — E785 Hyperlipidemia, unspecified: Secondary | ICD-10-CM

## 2016-09-11 DIAGNOSIS — I2510 Atherosclerotic heart disease of native coronary artery without angina pectoris: Secondary | ICD-10-CM

## 2016-09-11 DIAGNOSIS — I5032 Chronic diastolic (congestive) heart failure: Secondary | ICD-10-CM

## 2016-09-11 NOTE — Telephone Encounter (Signed)
-----   Message from Raiford Simmonds, RN sent at 07/16/2016  4:52 PM EST ----- DUE October 16, 2016  LIPID,CMP MAIL LETTER, LAB SLIP  @[email or web address]  [phone]

## 2016-09-11 NOTE — Telephone Encounter (Signed)
Mail letter and labslip  

## 2016-09-12 ENCOUNTER — Other Ambulatory Visit: Payer: Self-pay | Admitting: Cardiology

## 2016-09-14 NOTE — Telephone Encounter (Signed)
REFILL 

## 2016-10-22 LAB — COMPREHENSIVE METABOLIC PANEL
ALBUMIN: 3.6 g/dL (ref 3.6–5.1)
ALK PHOS: 81 U/L (ref 33–130)
ALT: 35 U/L — ABNORMAL HIGH (ref 6–29)
AST: 32 U/L (ref 10–35)
BILIRUBIN TOTAL: 0.6 mg/dL (ref 0.2–1.2)
BUN: 25 mg/dL (ref 7–25)
CALCIUM: 8.9 mg/dL (ref 8.6–10.4)
CO2: 29 mmol/L (ref 20–31)
Chloride: 108 mmol/L (ref 98–110)
Creat: 1.47 mg/dL — ABNORMAL HIGH (ref 0.50–0.99)
Glucose, Bld: 87 mg/dL (ref 65–99)
Potassium: 4.2 mmol/L (ref 3.5–5.3)
Sodium: 145 mmol/L (ref 135–146)
TOTAL PROTEIN: 6.1 g/dL (ref 6.1–8.1)

## 2016-10-22 LAB — LIPID PANEL
Cholesterol: 101 mg/dL (ref <200)
HDL: 28 mg/dL — AB (ref >50)
LDL Cholesterol: 55 mg/dL (ref <100)
TRIGLYCERIDES: 92 mg/dL (ref <150)
Total CHOL/HDL Ratio: 3.6 Ratio (ref <5.0)
VLDL: 18 mg/dL (ref <30)

## 2016-11-16 ENCOUNTER — Telehealth: Payer: Self-pay | Admitting: *Deleted

## 2016-11-16 DIAGNOSIS — I5032 Chronic diastolic (congestive) heart failure: Secondary | ICD-10-CM

## 2016-11-16 DIAGNOSIS — I2510 Atherosclerotic heart disease of native coronary artery without angina pectoris: Secondary | ICD-10-CM

## 2016-11-16 DIAGNOSIS — E785 Hyperlipidemia, unspecified: Secondary | ICD-10-CM

## 2016-11-16 DIAGNOSIS — Z79899 Other long term (current) drug therapy: Secondary | ICD-10-CM | POA: Insufficient documentation

## 2016-11-16 DIAGNOSIS — Z9861 Coronary angioplasty status: Secondary | ICD-10-CM

## 2016-11-16 DIAGNOSIS — I4821 Permanent atrial fibrillation: Secondary | ICD-10-CM

## 2016-11-16 NOTE — Telephone Encounter (Signed)
Spoke to patient. Result given . Verbalized understanding WILL MAIL LABSLIP IN 6 MONTHS- LIPID , CMP  PATIENT STATES SHE IS TAKING 40 MG ATORVASTATIN WITH OUT ANY SYMPTOMS. CONTINUE DOSE.

## 2016-11-16 NOTE — Telephone Encounter (Signed)
-----   Message from Leonie Man, MD sent at 11/12/2016  8:03 PM EDT ----- Laboratory results: Stable kidney function for the last year now. Essentially normal salts/electrolytes and liver function  .  cholesterol panel looks much better while on atorvastatin. Levels look great. If tolerated, would continue 40 mg day. If not, at least if he can try 20 mg a day that would be good. We can recheck in 6 months.   Glenetta Hew, MD

## 2016-11-16 NOTE — Telephone Encounter (Signed)
LEFT MESSAGE TO CALL BACK

## 2016-11-27 ENCOUNTER — Other Ambulatory Visit: Payer: Self-pay | Admitting: Cardiology

## 2016-12-07 ENCOUNTER — Other Ambulatory Visit: Payer: Self-pay | Admitting: Cardiology

## 2016-12-07 NOTE — Telephone Encounter (Signed)
REFILL 

## 2016-12-08 ENCOUNTER — Other Ambulatory Visit: Payer: Self-pay | Admitting: Cardiology

## 2016-12-08 NOTE — Telephone Encounter (Signed)
Rx(s) sent to pharmacy electronically.  

## 2017-01-14 ENCOUNTER — Ambulatory Visit (INDEPENDENT_AMBULATORY_CARE_PROVIDER_SITE_OTHER): Payer: Medicare Other | Admitting: Cardiology

## 2017-01-14 ENCOUNTER — Encounter: Payer: Self-pay | Admitting: Cardiology

## 2017-01-14 VITALS — BP 130/76 | HR 66 | Ht 60.0 in | Wt 238.0 lb

## 2017-01-14 DIAGNOSIS — I2510 Atherosclerotic heart disease of native coronary artery without angina pectoris: Secondary | ICD-10-CM

## 2017-01-14 DIAGNOSIS — Z9861 Coronary angioplasty status: Secondary | ICD-10-CM | POA: Diagnosis not present

## 2017-01-14 DIAGNOSIS — R0609 Other forms of dyspnea: Secondary | ICD-10-CM | POA: Diagnosis not present

## 2017-01-14 DIAGNOSIS — I10 Essential (primary) hypertension: Secondary | ICD-10-CM

## 2017-01-14 DIAGNOSIS — I482 Chronic atrial fibrillation: Secondary | ICD-10-CM | POA: Diagnosis not present

## 2017-01-14 DIAGNOSIS — R600 Localized edema: Secondary | ICD-10-CM | POA: Diagnosis not present

## 2017-01-14 DIAGNOSIS — I5032 Chronic diastolic (congestive) heart failure: Secondary | ICD-10-CM

## 2017-01-14 DIAGNOSIS — I4821 Permanent atrial fibrillation: Secondary | ICD-10-CM

## 2017-01-14 DIAGNOSIS — E785 Hyperlipidemia, unspecified: Secondary | ICD-10-CM

## 2017-01-14 MED ORDER — ATORVASTATIN CALCIUM 40 MG PO TABS
40.0000 mg | ORAL_TABLET | Freq: Every day | ORAL | 11 refills | Status: DC
Start: 2017-01-14 — End: 2017-07-13

## 2017-01-14 MED ORDER — IRBESARTAN 300 MG PO TABS: 300.0000 mg | ORAL_TABLET | Freq: Every day | ORAL | 11 refills | Status: DC

## 2017-01-14 MED ORDER — FUROSEMIDE 80 MG PO TABS: 80.0000 mg | ORAL_TABLET | Freq: Two times a day (BID) | ORAL | 11 refills | Status: DC

## 2017-01-14 NOTE — Progress Notes (Signed)
PCP: Lin Landsman, MD  Clinic Note: Chief Complaint  Patient presents with  . Follow-up    NO chest pain, shortness of breath, edema, pain or cramping in legs, lightheaded or dizziness  . Coronary Artery Disease  . Atrial Fibrillation  . Cardiomyopathy    EF ~45%    HPI: Stephanie Franco is a 66 y.o. female with a PMH below who presents today for ~ 66month f/u for CAD-PCI, Cardiomyopathy (EF roughly 45%) & PAF.  Stephanie Franco was last seen on 07/16/2016 - she was doing about as well as she had been doing a long time. She started that she was not exercising because she is "lazy ". No angina or dyspnea.  Recent Hospitalizations: None -- bout of bronchitis in May  Studies Personally Reviewed - (if available, images/films reviewed: From Epic Chart or Care Everywhere)  None  Interval History: Stephanie Franco presents today with no real complaints from a CV perspective.  She had a prolonged bout of bronchitis back in May that took a while to recover from, but is not back to her baseline. She still has occasional fleeting spells of palpitations that usually occur in response to emotional stress.  She also notes occasional DOE if she tries to go fast or does too much, but not with routine activity.  Notably, she denies any chest pain or pressure with rest or exertion and no PND, orthopnea or edema.  No syncope/near syncope or TIA/amaurosis fugax symptoms,  No melena, hematochezia, hematuria, or epstaxis. + still notes easy bruising. No claudication.  ROS: A comprehensive was performed. Review of Systems  Constitutional: Negative for malaise/fatigue.  HENT: Negative for congestion.   Respiratory: Negative for cough and wheezing.   Gastrointestinal: Negative for abdominal pain and heartburn.  Genitourinary: Negative for frequency.  Musculoskeletal: Positive for joint pain. Negative for falls.  Neurological: Positive for dizziness (occasionally with standing up too fast).    Psychiatric/Behavioral: The patient does not have insomnia.   All other systems reviewed and are negative.  I have reviewed and (if needed) personally updated the patient's problem list, medications, allergies, past medical and surgical history, social and family history.   Past Medical History:  Diagnosis Date  . Asthma   . Atrial fibrillation, permanent ([place])    Rate control with Bystolic. CHA2DS2Vasc = 6 (HTN, DM, CHF, Age 36, Female) -> on Pradaxa  . Breast cancer ([place]) 2008   S/P mastectomy  . CAD S/P percutaneous coronary angioplasty 2006;    PCI of circumflex with Taxus DES;; relook-cath Feb 2013: 50-60% short lesion in RCA, 40% ISR Circumflex stent.  . CKD (chronic kidney disease), stage III   . Diabetes mellitus, uncontrolled 07/14/2011  . Dilated cardiomyopathy ([place])    Severely depressed LV systolic function with EF of 25% range. Very limited study. Diffuse hypokinesis.  . Edema of both legs    Usually mild, chronic. Controlled with when necessary furosemide and diet  . 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 Gout   . History of echocardiogram 07/22/2011  . Hyperlipidemia   . Hypertension   . Morbid obesity (HCC)    BMI 41  . OSA on CPAP    On CPAP  . Stented coronary artery    40% ISR in Circumflex stent    Past Surgical History:  Procedure Laterality Date  . CORONARY ANGIOPLASTY WITH STENT PLACEMENT  2006   stent to circumflex  . LEFT HEART CATHETERIZATION WITH CORONARY ANGIOGRAM N/A 07/13/2011  Procedure: LEFT HEART CATHETERIZATION WITH CORONARY ANGIOGRAM;  Surgeon: Leonie Man, MD;  Location: [facility] CATH LAB;  Service: Cardiovascular;  normal EF; 64mm 50-60% lesion in RCA; patent stent in circumflex form 2006 with ~40% in-stent re-stenosis  . MASTECTOMY  2008   left  . NM MYOVIEW LTD  11/22/2014   Low Risk. No ischemia or infarction. Not gated 2/2  A. fib - unable to assess EF  . TRANSTHORACIC ECHOCARDIOGRAM  01/2015   LV EF 40-45%, Mild Anteroseptal HK.  Mod-Severe  RA dilation with elevated PA Pressures (~~peak 63 mmHg).. Mild LA dilation  . TRANSTHORACIC ECHOCARDIOGRAM  June 2015   In setting of Urosepsis: EF ~25 %, global HK (poor quality study)    Current Meds  Medication Sig  . albuterol (PROVENTIL HFA;VENTOLIN HFA) 108 (90 Base) MCG/ACT inhaler Inhale 1-2 puffs into the lungs every 6 (six) hours as needed for wheezing or shortness of breath.  Marland Kitchen albuterol (PROVENTIL) (2.5 MG/3ML) 0.083% nebulizer solution Take 3 mLs (2.5 mg total) by nebulization every 6 (six) hours as needed for wheezing or shortness of breath.  . allopurinol (ZYLOPRIM) 300 MG tablet Take 300 mg by mouth daily as needed (for gout flares). Gout  . BYSTOLIC 10 MG tablet TAKE 1 TABLET BY MOUTH EVERY DAY  . cholecalciferol (VITAMIN D) 1000 UNITS tablet Take 2,000 Units by mouth daily.  . colchicine 0.6 MG tablet Take 0.6 mg by mouth daily as needed. Take 2 tablets intially, then 1 tablet in 2 hours , Then 1 tablet daily For gout  . diclofenac (VOLTAREN) 75 MG EC tablet Take 1 tablet by mouth 2 (two) times daily.  Marland Kitchen ELIQUIS 5 MG TABS tablet Take 1 tablet by mouth 2 (two) times daily.  Marland Kitchen ezetimibe (ZETIA) 10 MG tablet TAKE 1 TABLET BY MOUTH EVERY DAY  . ferrous sulfate 325 (65 FE) MG EC tablet Take 325 mg by mouth daily with breakfast.  . fexofenadine (ALLEGRA) 180 MG tablet Take 180 mg by mouth daily as needed for allergies.   . fluticasone (FLONASE) 50 MCG/ACT nasal spray Place 2 sprays into the nose as needed for rhinitis.  . furosemide (LASIX) 80 MG tablet Take 1 tablet (80 mg total) by mouth 2 (two) times daily.  Marland Kitchen glucose blood (ACCU-CHEK SMARTVIEW) test strip 1 each by Other route as needed for other. Use as instructed  . Insulin Glargine (TOUJEO SOLOSTAR) 300 UNIT/ML SOPN Inject 16 Units into the skin every evening.  . isosorbide mononitrate (IMDUR) 60 MG 24 hr tablet TAKE 1 TABLET BY MOUTH EVERY DAY  . KLOR-CON M20 20 MEQ tablet TAKE 1 TABLET BY MOUTH TWICE A DAY    . levalbuterol (XOPENEX) 0.63 MG/3ML nebulizer solution Take 3 mLs (0.63 mg total) by nebulization every 6 (six) hours as needed for wheezing or shortness of breath.  . lidocaine (LIDODERM) 5 % Place 1 patch onto the skin as needed.  Marland Kitchen LORazepam (ATIVAN) 1 MG tablet Take 1 mg by mouth daily.  . Multiple Vitamins-Minerals (MULTIVITAMIN WITH MINERALS) tablet Take 1 tablet by mouth daily.  Marland Kitchen NITROSTAT 0.4 MG SL tablet DISSOLVE 1 TABLET (0.4 MG TOTAL) UNDER THE TONGUE EVERY 5 (FIVE) MINUTES AS NEEDED FOR CHEST PAIN.  Marland Kitchen pantoprazole (PROTONIX) 40 MG tablet TAKE 1 TABLET BY MOUTH EVERY DAY  . [DISCONTINUED] atorvastatin (LIPITOR) 40 MG tablet Take 1 tablet (40 mg total) by mouth daily.  . [DISCONTINUED] furosemide (LASIX) 80 MG tablet Take 1 tablet (80 mg total) by mouth 2 (two)  times daily.  . [DISCONTINUED] valsartan-hydrochlorothiazide (DIOVAN-HCT) 320-25 MG tablet TAKE 1 TABLET BY MOUTH DAILY.    No Known Allergies  Social History   Social History  . Marital status: Married    Spouse name: N/A  . Number of children: 4  . Years of education: N/A   Social History Main Topics  . Smoking status: Former Smoker    Packs/day: 0.50    Years: 6.00    Types: Cigarettes    Quit date: 05/18/1992  . Smokeless tobacco: Never Used  . Alcohol use No     Comment: 07/13/11 "have drank occasionally; not now"  . Drug use: No  . Sexual activity: Not Currently   Other Topics Concern  . None   Social History Narrative   She is a married mother of 41, grandmother 2. Usually accompanied by her husband. She does not work. She had been working on her exercise, but is no longer as active. Does not drink and does not smoke    family history includes Atrial fibrillation in her son; Coronary artery disease in her mother; Hypertension in her mother.  Wt Readings from Last 3 Encounters:  01/14/17 238 lb (108 kg)  07/16/16 243 lb (110.2 kg)  11/12/15 243 lb 12.8 oz (110.6 kg)    PHYSICAL EXAM BP 130/76    Pulse 66   Ht 5' (1.524 m)   Wt 238 lb (108 kg)   BMI 46.48 kg/m  Physical Exam  Constitutional: She is oriented to person, place, and time. She appears well-developed and well-nourished.  Well groomed.    HENT:  Head: Normocephalic and atraumatic.  Mouth/Throat: Oropharynx is clear and moist. No oropharyngeal exudate.  Eyes: EOM are normal.  Neck: Neck supple. No hepatojugular reflux and no JVD present. Carotid bruit is not present.  Cardiovascular: Normal rate, regular rhythm, normal heart sounds and intact distal pulses.  Exam reveals no gallop and no friction rub.   No murmur heard. Pulmonary/Chest: Effort normal and breath sounds normal. No respiratory distress. She has no wheezes. She has no rales. She exhibits no tenderness.  Abdominal: Soft. Bowel sounds are normal. She exhibits no distension. There is no tenderness. There is no rebound and no guarding.  Musculoskeletal: Normal range of motion. She exhibits no edema (trace).  Neurological: She is alert and oriented to person, place, and time.  Skin: Skin is warm and dry. No rash noted. No erythema.  Psychiatric: She has a normal mood and affect. Her behavior is normal. Judgment and thought content normal.  Nursing note and vitals reviewed.   Adult ECG Report n/a  Other studies Reviewed: Additional studies/ records that were reviewed today include:  Recent Labs:  Lab Results  Component Value Date   CHOL 101 10/22/2016   HDL 28 (L) 10/22/2016   LDLCALC 55 10/22/2016   TRIG 92 10/22/2016   CHOLHDL 3.6 10/22/2016   Lab Results  Component Value Date   CREATININE 1.47 (H) 10/22/2016   BUN 25 10/22/2016   NA 145 10/22/2016   K 4.2 10/22/2016   CL 108 10/22/2016   CO2 29 10/22/2016    ASSESSMENT / PLAN: Problem List Items Addressed This Visit    Atrial fibrillation, permanent ([place]); CHA2DS2Vasc - 6 (Chronic)    Stable  - rate control with Beta Blocker, appears to be maintaining NSR. On Eliquis with no bleeding  concerns.      Relevant Medications   irbesartan (AVAPRO) 300 MG tablet   furosemide (LASIX) 80 MG tablet  CAD S/P percutaneous coronary angioplasty (Chronic)    Patent stents as of 2013 & otherwise non-obstructive CAD. Negative Myoview in 2016. No ASA or Plavix b/c Eliquis. On statin with better lipid control. On stable dose of Bystolic - with no bradycardia or chronotropic incompetence.      Relevant Medications   irbesartan (AVAPRO) 300 MG tablet   furosemide (LASIX) 80 MG tablet   Chronic diastolic heart failure, NYHA class 2 (HCC) (Chronic)    EF       Relevant Medications   irbesartan (AVAPRO) 300 MG tablet   furosemide (LASIX) 80 MG tablet   DOE (dyspnea on exertion)    Probably multifactorial - weight as well as deconditioning & then mild-mod combined HF.      Edema of both legs (Chronic)    Well controlled - euvolemic now on current dose of Lasix.      Essential hypertension (Chronic)    Well controlled pressure.  With concern for Valsartan recall & frequent urination, will change from Valsartan-HCTZ to Irbesartan.       Relevant Medications   irbesartan (AVAPRO) 300 MG tablet   furosemide (LASIX) 80 MG tablet   Hyperlipidemia with target LDL less than 70 (Chronic)    We restarted a statin last visit - lipids now well controlled. Recheck in 6 month.      Relevant Medications   irbesartan (AVAPRO) 300 MG tablet   furosemide (LASIX) 80 MG tablet   Morbid obesity (HCC) (Chronic)    Lost 5 lb from last visit - congratulated her on her efforts. Continue dietary modification & keep trying to exercise.         Current medicines are reviewed at length with the patient today. (+/- concerns) none The following changes have been made: n/a  Patient Instructions  MEDICATION CHANGES  STOP VALSARTAN-HCTZ  START IRBESARTAN 300 MG ONE TABLET DAILY    Your physician wants you to follow-up in [place].You will receive a reminder letter in the  mail two months in advance. If you don't receive a letter, please call our office to schedule the follow-up appointment.   If you need a refill on your cardiac medications before your next appointment, please call your pharmacy.    Studies Ordered:   No orders of the defined types were placed in this encounter.     Glenetta Hew, M.D., M.S. Interventional Cardiologist   Pager # [phone] Phone # [phone] [address] [place], [place] [address]

## 2017-01-14 NOTE — Patient Instructions (Signed)
MEDICATION CHANGES  STOP VALSARTAN-HCTZ  START IRBESARTAN 300 MG ONE TABLET DAILY    Your physician wants you to follow-up in [place].You will receive a reminder letter in the mail two months in advance. If you don't receive a letter, please call our office to schedule the follow-up appointment.   If you need a refill on your cardiac medications before your next appointment, please call your pharmacy.

## 2017-01-17 ENCOUNTER — Encounter: Payer: Self-pay | Admitting: Cardiology

## 2017-01-17 NOTE — Assessment & Plan Note (Signed)
Patent stents as of 2013 & otherwise non-obstructive CAD. Negative Myoview in 2016. No ASA or Plavix b/c Eliquis. On statin with better lipid control. On stable dose of Bystolic - with no bradycardia or chronotropic incompetence.

## 2017-01-17 NOTE — Assessment & Plan Note (Signed)
Stable  - rate control with Beta Blocker, appears to be maintaining NSR. On Eliquis with no bleeding concerns.

## 2017-01-17 NOTE — Assessment & Plan Note (Signed)
Well controlled - euvolemic now on current dose of Lasix.

## 2017-01-17 NOTE — Assessment & Plan Note (Signed)
Well controlled pressure.  With concern for Valsartan recall & frequent urination, will change from Valsartan-HCTZ to Irbesartan.

## 2017-01-17 NOTE — Assessment & Plan Note (Signed)
Probably multifactorial - weight as well as deconditioning & then mild-mod combined HF.

## 2017-01-17 NOTE — Assessment & Plan Note (Signed)
We restarted a statin last visit - lipids now well controlled. Recheck in 6 month.

## 2017-01-17 NOTE — Assessment & Plan Note (Signed)
Lost 5 lb from last visit - congratulated her on her efforts. Continue dietary modification & keep trying to exercise.

## 2017-01-17 NOTE — Assessment & Plan Note (Signed)
EF

## 2017-02-20 ENCOUNTER — Emergency Department (HOSPITAL_COMMUNITY): Payer: Medicare Other

## 2017-02-20 ENCOUNTER — Other Ambulatory Visit: Payer: Self-pay

## 2017-02-20 ENCOUNTER — Emergency Department (HOSPITAL_COMMUNITY)
Admission: EM | Admit: 2017-02-20 | Discharge: 2017-02-21 | Disposition: A | Discharge status: Home / Self Care | Payer: Medicare Other | Attending: Emergency Medicine | Admitting: Emergency Medicine

## 2017-02-20 ENCOUNTER — Encounter (HOSPITAL_COMMUNITY): Payer: Self-pay | Admitting: Nurse Practitioner

## 2017-02-20 DIAGNOSIS — Z794 Long term (current) use of insulin: Secondary | ICD-10-CM | POA: Diagnosis not present

## 2017-02-20 DIAGNOSIS — R5383 Other fatigue: Secondary | ICD-10-CM | POA: Insufficient documentation

## 2017-02-20 DIAGNOSIS — J45909 Unspecified asthma, uncomplicated: Secondary | ICD-10-CM | POA: Insufficient documentation

## 2017-02-20 DIAGNOSIS — Z87891 Personal history of nicotine dependence: Secondary | ICD-10-CM | POA: Diagnosis not present

## 2017-02-20 DIAGNOSIS — Z853 Personal history of malignant neoplasm of breast: Secondary | ICD-10-CM | POA: Insufficient documentation

## 2017-02-20 DIAGNOSIS — Z955 Presence of coronary angioplasty implant and graft: Secondary | ICD-10-CM | POA: Insufficient documentation

## 2017-02-20 DIAGNOSIS — N183 Chronic kidney disease, stage 3 (moderate): Secondary | ICD-10-CM | POA: Diagnosis not present

## 2017-02-20 DIAGNOSIS — I130 Hypertensive heart and chronic kidney disease with heart failure and stage 1 through stage 4 chronic kidney disease, or unspecified chronic kidney disease: Secondary | ICD-10-CM | POA: Insufficient documentation

## 2017-02-20 DIAGNOSIS — R110 Nausea: Secondary | ICD-10-CM | POA: Insufficient documentation

## 2017-02-20 DIAGNOSIS — R0609 Other forms of dyspnea: Secondary | ICD-10-CM | POA: Insufficient documentation

## 2017-02-20 DIAGNOSIS — I2510 Atherosclerotic heart disease of native coronary artery without angina pectoris: Secondary | ICD-10-CM | POA: Diagnosis not present

## 2017-02-20 DIAGNOSIS — R2243 Localized swelling, mass and lump, lower limb, bilateral: Secondary | ICD-10-CM | POA: Insufficient documentation

## 2017-02-20 DIAGNOSIS — R140 Abdominal distension (gaseous): Secondary | ICD-10-CM | POA: Diagnosis not present

## 2017-02-20 DIAGNOSIS — E1122 Type 2 diabetes mellitus with diabetic chronic kidney disease: Secondary | ICD-10-CM | POA: Insufficient documentation

## 2017-02-20 DIAGNOSIS — Z7901 Long term (current) use of anticoagulants: Secondary | ICD-10-CM | POA: Diagnosis not present

## 2017-02-20 DIAGNOSIS — I5032 Chronic diastolic (congestive) heart failure: Secondary | ICD-10-CM | POA: Insufficient documentation

## 2017-02-20 DIAGNOSIS — Z79899 Other long term (current) drug therapy: Secondary | ICD-10-CM | POA: Insufficient documentation

## 2017-02-20 DIAGNOSIS — R531 Weakness: Secondary | ICD-10-CM | POA: Diagnosis present

## 2017-02-20 DIAGNOSIS — R0600 Dyspnea, unspecified: Secondary | ICD-10-CM

## 2017-02-20 LAB — URINALYSIS, ROUTINE W REFLEX MICROSCOPIC
Bacteria, UA: NONE SEEN
Bilirubin Urine: NEGATIVE
Glucose, UA: NEGATIVE mg/dL
Hgb urine dipstick: NEGATIVE
Ketones, ur: NEGATIVE mg/dL
Leukocytes, UA: NEGATIVE
Nitrite: NEGATIVE
Protein, ur: 30 mg/dL — AB
Specific Gravity, Urine: 1.016 (ref 1.005–1.030)
WBC, UA: NONE SEEN WBC/hpf (ref 0–5)
pH: 5 (ref 5.0–8.0)

## 2017-02-20 LAB — CBC
HCT: 36.8 % (ref 36.0–46.0)
Hemoglobin: 11.9 g/dL — ABNORMAL LOW (ref 12.0–15.0)
MCH: 27.9 pg (ref 26.0–34.0)
MCHC: 32.3 g/dL (ref 30.0–36.0)
MCV: 86.2 fL (ref 78.0–100.0)
Platelets: 174 10*3/uL (ref 150–400)
RBC: 4.27 MIL/uL (ref 3.87–5.11)
RDW: 17 % — ABNORMAL HIGH (ref 11.5–15.5)
WBC: 8.1 10*3/uL (ref 4.0–10.5)

## 2017-02-20 LAB — COMPREHENSIVE METABOLIC PANEL
ALBUMIN: 3.9 g/dL (ref 3.5–5.0)
ALT: 27 U/L (ref 14–54)
AST: 35 U/L (ref 15–41)
Alkaline Phosphatase: 86 U/L (ref 38–126)
Anion gap: 10 (ref 5–15)
BUN: 22 mg/dL — AB (ref 6–20)
CHLORIDE: 110 mmol/L (ref 101–111)
CO2: 24 mmol/L (ref 22–32)
Calcium: 9.5 mg/dL (ref 8.9–10.3)
Creatinine, Ser: 1.15 mg/dL — ABNORMAL HIGH (ref 0.44–1.00)
GFR calc Af Amer: 55 mL/min — ABNORMAL LOW (ref >60)
GFR calc non Af Amer: 48 mL/min — ABNORMAL LOW (ref >60)
GLUCOSE: 115 mg/dL — AB (ref 65–99)
POTASSIUM: 4.7 mmol/L (ref 3.5–5.1)
SODIUM: 144 mmol/L (ref 135–145)
Total Bilirubin: 1 mg/dL (ref 0.3–1.2)
Total Protein: 7.4 g/dL (ref 6.5–8.1)

## 2017-02-20 LAB — MAGNESIUM: MAGNESIUM: 2 mg/dL (ref 1.7–2.4)

## 2017-02-20 LAB — GLUCOSE, CAPILLARY: Glucose-Capillary: 85 mg/dL (ref 65–99)

## 2017-02-20 LAB — BRAIN NATRIURETIC PEPTIDE: B Natriuretic Peptide: 203.9 pg/mL — ABNORMAL HIGH (ref 0.0–100.0)

## 2017-02-20 LAB — LIPASE, BLOOD: Lipase: 35 U/L (ref 11–51)

## 2017-02-20 LAB — POCT I-STAT TROPONIN I: Troponin i, poc: 0 ng/mL (ref 0.00–0.08)

## 2017-02-20 MED ORDER — FUROSEMIDE 10 MG/ML IJ SOLN
40.0000 mg | Freq: Once | INTRAMUSCULAR | Status: AC
Start: 2017-02-20 — End: 2017-02-20
  Administered 2017-02-20: 40 mg via INTRAVENOUS
  Filled 2017-02-20: qty 4

## 2017-02-20 NOTE — Discharge Instructions (Signed)
Treatment: Continue taking your medications at home as prescribed.  Follow-up: Please see your doctor on Monday for further evaluation and treatment. Please return to the emergency department if you develop any new or worsening symptoms including chest pain, severe shortness of breath, or any other new or concerning symptom.

## 2017-02-20 NOTE — ED Notes (Signed)
Pt report in the last 2 hours has felt some lower pain radiating to her back asking "if her blood work will check for UTI"

## 2017-02-20 NOTE — ED Notes (Signed)
Pt. CBG 85, RN,Oscar made aware.

## 2017-02-20 NOTE — ED Provider Notes (Signed)
[facility] DEPT Provider Note   CSN: [id number] Arrival date & time: 02/20/17  1713     History   Chief Complaint Chief Complaint  Patient presents with  . Weakness  . Shortness of Breath    HPI Stephanie Franco is a 66 y.o. female with history of CAD, atrial fibrillation anticoagulated on Eliquis, rate controlled with Bystolic, CHF who presents with a 2 day history or shortness of breath and generalized weakness and fatigue. She reports her shortness of breath was initially at rest and on exertion, however it has become much worse on exertion. She denies any chest pain. She has had some associated nausea, which self resolves. She denies any abdominal pain, but does note some fullness and distention in her abdomen. She has also noted some mild edema in her feet. She denies any urinary symptoms. She recently came off of valsartan-HCTZ 3 weeks ago. She has been taking her Lasix as prescribed. Patient notes that she has felt like this in the past when her potassium has been low.  HPI  Past Medical History:  Diagnosis Date  . Asthma   . Atrial fibrillation, permanent ([place])    Rate control with Bystolic. CHA2DS2Vasc = 6 (HTN, DM, CHF, Age 18, Female) -> on Pradaxa  . Breast cancer ([place]) 2008   S/P mastectomy  . CAD S/P percutaneous coronary angioplasty 2006;    PCI of circumflex with Taxus DES;; relook-cath Feb 2013: 50-60% short lesion in RCA, 40% ISR Circumflex stent.  . CKD (chronic kidney disease), stage III ([place])   . Diabetes mellitus, uncontrolled 07/14/2011  . Dilated cardiomyopathy ([place])    Severely depressed LV systolic function with EF of 25% range. Very limited study. Diffuse hypokinesis.  . Edema of both legs    Usually mild, chronic. Controlled with when necessary furosemide and diet  . 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 Gout   . History of echocardiogram 07/22/2011  . Hyperlipidemia   . Hypertension   . Morbid obesity ([facility])    BMI 41  . OSA on CPAP      On CPAP  . Stented coronary artery    40% ISR in Circumflex stent    Patient Active Problem List   Diagnosis Date Noted  . Medication management 11/16/2016  . Congestive dilated cardiomyopathy ([place]) 01/29/2015  . Chronic diastolic heart failure, NYHA class 2 ([place])   . Hypokalemia 10/31/2014  . Pyelonephritis 10/31/2014  . Abnormal liver function 10/31/2014  . Acute renal failure superimposed on stage 3 chronic kidney disease ([place])   . Fever 06/29/2013  . Severe obesity (BMI >= 40) ([place]) 06/24/2013  . Leg cramping 06/24/2013  . DOE (dyspnea on exertion) 12/20/2012    Class: Diagnosis of  . CAD S/P percutaneous coronary angioplasty   . Atrial fibrillation, permanent ([place]); CHA2DS2Vasc - 6   . Edema of both legs   . CAD (coronary artery disease), nonobstructive CAD by cath 07/13/11 07/19/2011  . Diabetes mellitus ([place]) 07/14/2011  . Morbid obesity ([place]) 07/12/2011  . Uterine fibroid, (bleeding issues when she was put on Pradaxa for AF) 07/12/2011  . Hyperlipidemia with target LDL less than 70 07/12/2011  . Sleep apnea, on C-Pap 07/12/2011  . Essential hypertension     Past Surgical History:  Procedure Laterality Date  . CORONARY ANGIOPLASTY WITH STENT PLACEMENT  2006   stent to circumflex  . LEFT HEART CATHETERIZATION WITH CORONARY ANGIOGRAM N/A 07/13/2011   Procedure: LEFT HEART CATHETERIZATION WITH CORONARY ANGIOGRAM;  Surgeon: Leonie Man, MD;  Location: [facility] CATH LAB;  Service: Cardiovascular;  normal EF; 54mm 50-60% lesion in RCA; patent stent in circumflex form 2006 with ~40% in-stent re-stenosis  . MASTECTOMY  2008   left  . NM MYOVIEW LTD  11/22/2014   Low Risk. No ischemia or infarction. Not gated 2/2  A. fib - unable to assess EF  . TRANSTHORACIC ECHOCARDIOGRAM  01/2015   LV EF 40-45%, Mild Anteroseptal HK. Mod-Severe  RA dilation with elevated PA Pressures (~~peak 63 mmHg).. Mild LA dilation  . TRANSTHORACIC ECHOCARDIOGRAM  June 2015   In setting of Urosepsis: EF  ~25 %, global HK (poor quality study)    OB History    No data available       Home Medications    Prior to Admission medications   Medication Sig Start Date End Date Taking? Authorizing Provider  albuterol (PROVENTIL HFA;VENTOLIN HFA) 108 (90 Base) MCG/ACT inhaler Inhale 1-2 puffs into the lungs every 6 (six) hours as needed for wheezing or shortness of breath. 05/20/15   Harvel Quale, MD  albuterol (PROVENTIL) (2.5 MG/3ML) 0.083% nebulizer solution Take 3 mLs (2.5 mg total) by nebulization every 6 (six) hours as needed for wheezing or shortness of breath. 05/20/15   Harvel Quale, MD  allopurinol (ZYLOPRIM) 300 MG tablet Take 300 mg by mouth daily as needed (for gout flares). Gout    [provider]  atorvastatin (LIPITOR) 40 MG tablet Take 1 tablet (40 mg total) by mouth daily. 01/14/17 04/14/17  Leonie Man, MD  BYSTOLIC 10 MG tablet TAKE 1 TABLET BY MOUTH EVERY DAY 12/07/16   Leonie Man, MD  cholecalciferol (VITAMIN D) 1000 UNITS tablet Take 2,000 Units by mouth daily.    [provider]  colchicine 0.6 MG tablet Take 0.6 mg by mouth daily as needed. Take 2 tablets intially, then 1 tablet in 2 hours , Then 1 tablet daily For gout    [provider]  diclofenac (VOLTAREN) 75 MG EC tablet Take 1 tablet by mouth 2 (two) times daily. 07/11/16   [provider]  ELIQUIS 5 MG TABS tablet Take 1 tablet by mouth 2 (two) times daily. 06/13/16   [provider]  ezetimibe (ZETIA) 10 MG tablet TAKE 1 TABLET BY MOUTH EVERY DAY 12/08/16   Leonie Man, MD  ferrous sulfate 325 (65 FE) MG EC tablet Take 325 mg by mouth daily with breakfast.    [provider]  fexofenadine (ALLEGRA) 180 MG tablet Take 180 mg by mouth daily as needed for allergies.  04/30/15   [provider]  fluticasone (FLONASE) 50 MCG/ACT nasal spray Place 2 sprays into the nose as needed for rhinitis.    [provider]  furosemide (LASIX)  80 MG tablet Take 1 tablet (80 mg total) by mouth 2 (two) times daily. 01/14/17   Leonie Man, MD  glucose blood [facility]) test strip 1 each by Other route as needed for other. Use as instructed    [provider]  Insulin Glargine (TOUJEO SOLOSTAR) 300 UNIT/ML SOPN Inject 16 Units into the skin every evening.    [provider]  irbesartan (AVAPRO) 300 MG tablet Take 1 tablet (300 mg total) by mouth daily. 01/14/17   Leonie Man, MD  isosorbide mononitrate (IMDUR) 60 MG 24 hr tablet TAKE 1 TABLET BY MOUTH EVERY DAY 11/27/16   Leonie Man, MD  KLOR-CON M20 20 MEQ tablet TAKE 1  TABLET BY MOUTH TWICE A DAY 12/07/16   Leonie Man, MD  levalbuterol Penne Lash) 0.63 MG/3ML nebulizer solution Take 3 mLs (0.63 mg total) by nebulization every 6 (six) hours as needed for wheezing or shortness of breath. 07/01/13   Bonnielee Haff, MD  lidocaine (LIDODERM) 5 % Place 1 patch onto the skin as needed. 06/16/16   [provider]  LORazepam (ATIVAN) 1 MG tablet Take 1 mg by mouth daily. 07/07/16   [provider]  Multiple Vitamins-Minerals (MULTIVITAMIN WITH MINERALS) tablet Take 1 tablet by mouth daily.    [provider]  NITROSTAT 0.4 MG SL tablet DISSOLVE 1 TABLET (0.4 MG TOTAL) UNDER THE TONGUE EVERY 5 (FIVE) MINUTES AS NEEDED FOR CHEST PAIN. 04/28/16   Leonie Man, MD  pantoprazole (PROTONIX) 40 MG tablet TAKE 1 TABLET BY MOUTH EVERY DAY 12/08/16   Leonie Man, MD    Family History Family History  Problem Relation Age of Onset  . Coronary artery disease Mother   . Hypertension Mother   . Atrial fibrillation Son     Social History Social History  Substance Use Topics  . Smoking status: Former Smoker    Packs/day: 0.50    Years: 6.00    Types: Cigarettes    Quit date: 05/18/1992  . Smokeless tobacco: Never Used  . Alcohol use No     Comment: 07/13/11 "have drank occasionally; not now"     Allergies   Patient has no  known allergies.   Review of Systems Review of Systems  Constitutional: Positive for fatigue. Negative for chills and fever.  HENT: Negative for facial swelling and sore throat.   Respiratory: Positive for shortness of breath.   Cardiovascular: Negative for chest pain.  Gastrointestinal: Positive for abdominal distention. Negative for abdominal pain, nausea and vomiting.  Genitourinary: Negative for dysuria.  Musculoskeletal: Negative for back pain.  Skin: Negative for rash and wound.  Neurological: Positive for weakness. Negative for headaches.  Psychiatric/Behavioral: The patient is not nervous/anxious.      Physical Exam Updated Vital Signs BP (!) 163/100 (BP Location: Right Arm)   Pulse 82   Temp 97.9 F (36.6 C) (Oral)   Resp 20   SpO2 97%   Physical Exam  Constitutional: She appears well-developed and well-nourished. No distress.  HENT:  Head: Normocephalic and atraumatic.  Mouth/Throat: Oropharynx is clear and moist. No oropharyngeal exudate.  Eyes: Pupils are equal, round, and reactive to light. Conjunctivae are normal. Right eye exhibits no discharge. Left eye exhibits no discharge. No scleral icterus.  Neck: Normal range of motion. Neck supple. No thyromegaly present.  Cardiovascular: Normal rate, regular rhythm, normal heart sounds and intact distal pulses.  Exam reveals no gallop and no friction rub.   No murmur heard. Pulmonary/Chest: Effort normal. No stridor. No respiratory distress. She has decreased breath sounds. She has no wheezes. She has no rales. She exhibits no tenderness.  Abdominal: Soft. Bowel sounds are normal. She exhibits no distension. There is no tenderness. There is no rebound and no guarding.  Musculoskeletal: She exhibits no edema.  Lymphadenopathy:    She has no cervical adenopathy.  Neurological: She is alert. Coordination normal.  Skin: Skin is warm and dry. No rash noted. She is not diaphoretic. No pallor.  Psychiatric: She has a normal  mood and affect.  Nursing note and vitals reviewed.    ED Treatments / Results  Labs (all labs ordered are listed, but only abnormal results are displayed) Labs Reviewed  CBC - Abnormal; Notable for the following:       Result Value   Hemoglobin 11.9 (*)    RDW 17.0 (*)    All other components within normal limits  BRAIN NATRIURETIC PEPTIDE - Abnormal; Notable for the following:    B Natriuretic Peptide 203.9 (*)    All other components within normal limits  COMPREHENSIVE METABOLIC PANEL - Abnormal; Notable for the following:    Glucose, Bld 115 (*)    BUN 22 (*)    Creatinine, Ser 1.15 (*)    GFR calc non Af Amer 48 (*)    GFR calc Af Amer 55 (*)    All other components within normal limits  URINALYSIS, ROUTINE W REFLEX MICROSCOPIC - Abnormal; Notable for the following:    Protein, ur 30 (*)    Squamous Epithelial / LPF 0-5 (*)    All other components within normal limits  MAGNESIUM  LIPASE, BLOOD  GLUCOSE, CAPILLARY  I-STAT TROPONIN, ED  POCT I-STAT TROPONIN I  CBG MONITORING, ED    EKG  EKG Interpretation  Date/Time:  Saturday February 20 2017 17:55:28 EDT Ventricular Rate:  64 PR Interval:    QRS Duration: 124 QT Interval:  427 QTC Calculation: 441 R Axis:   -75 Text Interpretation:  Atrial fibrillation Nonspecific IVCD with LAD LVH with secondary repolarization abnormality Anterior Q waves, possibly due to LVH No significant change since last tracing Confirmed by Wandra Arthurs [phone]) on 02/20/2017 6:52:18 PM       Radiology Dg Chest 2 View  Result Date: 02/20/2017 CLINICAL DATA:  Dyspnea x2 days EXAM: CHEST  2 VIEW COMPARISON:  05/20/2015 FINDINGS: Stable enlargement of the cardiac silhouette with aortic atherosclerosis and mild central vascular congestion. Degenerative changes are seen along the dorsal spine. No effusion or pneumothorax. IMPRESSION: Cardiomegaly with central vascular congestion. Aortic atherosclerosis. Electronically Signed   By: Ashley Royalty  M.D.   On: 02/20/2017 17:54    Procedures Procedures (including critical care time)  Medications Ordered in ED Medications  furosemide (LASIX) injection 40 mg (40 mg Intravenous Given 02/20/17 2303)     Initial Impression / Assessment and Plan / ED Course  I have reviewed the triage vital signs and the nursing notes.  Pertinent labs & imaging results that were available during my care of the patient were reviewed by me and considered in my medical decision making (see chart for details).     CHA2DS2/VAS Stroke Risk 6 anticoagulated on Eliquis   Patient with shortness of breath, worse on exertion, as well as some abdominal distention. No abdominal tenderness. CBC shows HGB 11.9. CMP shows BUN 22, Cr 1.15. BNP 203.9. Troponin negative. UA negative. CXR cardiomegaly with central vascular congestion. EKG shows A fib. Patient given 40mg  IV Lasix in the ED with good resolution of shortness of breath. Patient ambulated with pulse ox with O2 sats at >95% prior to discharge. Patient to follow up with PCP on Monday for recheck and further evaluation and treatment. Return precautions discussed. Patient understands and agrees with plan. Patient also evaluated by Dr. Darl Householder who guided the patient's management and agrees with plan. Patient discharged in satisfactory condition.    Final Clinical Impressions(s) / ED Diagnoses   Final diagnoses:  Dyspnea on exertion    New Prescriptions New Prescriptions   No medications on file         Caryl Ada 02/21/17 0031    Drenda Freeze, MD 02/21/17 [phone]

## 2017-02-20 NOTE — ED Triage Notes (Signed)
Pt is c/o of weakness and shortness of breath that started 2 days ago. Remarks on PMHx of afib.  No other complaints.

## 2017-02-21 NOTE — ED Notes (Signed)
SpO2 stayed above 97% while ambulating down hallway and back

## 2017-04-15 ENCOUNTER — Telehealth: Payer: Self-pay | Admitting: *Deleted

## 2017-04-15 DIAGNOSIS — I4821 Permanent atrial fibrillation: Secondary | ICD-10-CM

## 2017-04-15 DIAGNOSIS — I2510 Atherosclerotic heart disease of native coronary artery without angina pectoris: Secondary | ICD-10-CM

## 2017-04-15 DIAGNOSIS — E785 Hyperlipidemia, unspecified: Secondary | ICD-10-CM

## 2017-04-15 DIAGNOSIS — I5032 Chronic diastolic (congestive) heart failure: Secondary | ICD-10-CM

## 2017-04-15 DIAGNOSIS — Z79899 Other long term (current) drug therapy: Secondary | ICD-10-CM

## 2017-04-15 DIAGNOSIS — Z9861 Coronary angioplasty status: Principal | ICD-10-CM

## 2017-04-15 NOTE — Telephone Encounter (Signed)
Mailed letter and labslip 

## 2017-04-15 NOTE — Telephone Encounter (Signed)
-----   Message from Raiford Simmonds, RN sent at 11/16/2016 11:01 AM EDT ----- CMP,LIPID  --DUE IN May 19 2016  MAIL @ [place]

## 2017-05-14 ENCOUNTER — Telehealth: Payer: Self-pay | Admitting: Cardiology

## 2017-05-14 NOTE — Telephone Encounter (Signed)
Returned call to patient.  Patient states she received lab slips and said she is due by Jan 2nd to have labs drawn.  Patient wanted to verify that if it was a few days after the 2nd this would be okay and not interfere with anything for Dr. Ellyn Hack.  Advised this would be okay.    Patient also aware of upcoming appt 2/26 for a 6 month OV with Dr. Ellyn Hack.     Patient aware and verbalized understanding.

## 2017-05-14 NOTE — Telephone Encounter (Signed)
Patient calling, would like to speak with Stephanie Franco about some bloodwork.

## 2017-05-19 ENCOUNTER — Other Ambulatory Visit: Payer: Self-pay | Admitting: Cardiology

## 2017-05-19 LAB — COMPLETE METABOLIC PANEL WITH GFR
AG RATIO: 1.6 (calc) (ref 1.0–2.5)
ALT: 21 U/L (ref 6–29)
AST: 24 U/L (ref 10–35)
Albumin: 3.9 g/dL (ref 3.6–5.1)
Alkaline phosphatase (APISO): 89 U/L (ref 33–130)
BUN/Creatinine Ratio: 21 (calc) (ref 6–22)
BUN: 31 mg/dL — ABNORMAL HIGH (ref 7–25)
CALCIUM: 9.3 mg/dL (ref 8.6–10.4)
CO2: 28 mmol/L (ref 20–32)
Chloride: 106 mmol/L (ref 98–110)
Creat: 1.45 mg/dL — ABNORMAL HIGH (ref 0.50–0.99)
GFR, EST AFRICAN AMERICAN: 43 mL/min/{1.73_m2} — AB (ref >=60)
GFR, EST NON AFRICAN AMERICAN: 37 mL/min/{1.73_m2} — AB (ref >=60)
Globulin: 2.4 g/dL (calc) (ref 1.9–3.7)
Glucose, Bld: 146 mg/dL — ABNORMAL HIGH (ref 65–99)
POTASSIUM: 3.8 mmol/L (ref 3.5–5.3)
SODIUM: 143 mmol/L (ref 135–146)
TOTAL PROTEIN: 6.3 g/dL (ref 6.1–8.1)
Total Bilirubin: 0.5 mg/dL (ref 0.2–1.2)

## 2017-05-19 LAB — LIPID PANEL
CHOL/HDL RATIO: 3.3 (calc) (ref <5.0)
Cholesterol: 117 mg/dL (ref <200)
HDL: 36 mg/dL — AB (ref >50)
LDL Cholesterol (Calc): 61 mg/dL (calc)
Non-HDL Cholesterol (Calc): 81 mg/dL (calc) (ref <130)
TRIGLYCERIDES: 113 mg/dL (ref <150)

## 2017-05-31 ENCOUNTER — Telehealth: Payer: Self-pay | Admitting: *Deleted

## 2017-05-31 DIAGNOSIS — R799 Abnormal finding of blood chemistry, unspecified: Secondary | ICD-10-CM

## 2017-05-31 DIAGNOSIS — E860 Dehydration: Secondary | ICD-10-CM

## 2017-05-31 DIAGNOSIS — R7989 Other specified abnormal findings of blood chemistry: Secondary | ICD-10-CM | POA: Insufficient documentation

## 2017-05-31 DIAGNOSIS — Z79899 Other long term (current) drug therapy: Secondary | ICD-10-CM

## 2017-05-31 NOTE — Telephone Encounter (Signed)
-----   Message from Leonie Man, MD sent at 05/23/2017 10:34 PM EST ----- Overall, the lab results look pretty good.  Total cholesterol is pretty stable at 117.  Triglycerides are also relatively stable at 113.  HDL has actually gone up to 36.  Notably improved LDL of 61. -->  No change to current management Chemistry panel shows higher glucose level been normal.  The creatinine level is back up indicating return to the previously reduced renal function. It does look like she is a bit dehydrated.  Recommend increasing hydration.  We can recheck BMP in roughly 1 month, which would be prior to her 66-month follow-up visit. Normal liver function.   Glenetta Hew, MD

## 2017-05-31 NOTE — Telephone Encounter (Signed)
LEFT MESSAGE TO CALL BACK -- NEED TO INFORM PATIENT MAILING LAB SLIP .PLEASE HAVE LAB WORK DONE PRIOR TO 2/26 /19 APPOINTMENT. PATIENT REVIEWED VIA MYCHART.

## 2017-06-09 NOTE — Telephone Encounter (Signed)
SPOKE TO PATIENT ON 05/31/17- RESULT GIVEN

## 2017-06-11 ENCOUNTER — Other Ambulatory Visit: Payer: Self-pay | Admitting: Cardiology

## 2017-06-11 NOTE — Telephone Encounter (Signed)
Rx has been sent to the pharmacy electronically. ° °

## 2017-06-22 DIAGNOSIS — M1611 Unilateral primary osteoarthritis, right hip: Secondary | ICD-10-CM | POA: Diagnosis present

## 2017-07-06 LAB — BASIC METABOLIC PANEL
BUN/Creatinine Ratio: 21 (ref 12–28)
BUN: 28 mg/dL — ABNORMAL HIGH (ref 8–27)
CALCIUM: 9.5 mg/dL (ref 8.7–10.3)
CO2: 24 mmol/L (ref 20–29)
Chloride: 105 mmol/L (ref 96–106)
Creatinine, Ser: 1.36 mg/dL — ABNORMAL HIGH (ref 0.57–1.00)
GFR calc Af Amer: 46 mL/min/{1.73_m2} — ABNORMAL LOW (ref >59)
GFR, EST NON AFRICAN AMERICAN: 40 mL/min/{1.73_m2} — AB (ref >59)
Glucose: 120 mg/dL — ABNORMAL HIGH (ref 65–99)
Potassium: 3.8 mmol/L (ref 3.5–5.2)
SODIUM: 144 mmol/L (ref 134–144)

## 2017-07-13 ENCOUNTER — Ambulatory Visit (INDEPENDENT_AMBULATORY_CARE_PROVIDER_SITE_OTHER): Payer: Medicare Other | Admitting: Cardiology

## 2017-07-13 ENCOUNTER — Encounter: Payer: Self-pay | Admitting: Cardiology

## 2017-07-13 VITALS — BP 126/78 | HR 57 | Ht 60.0 in | Wt 246.2 lb

## 2017-07-13 DIAGNOSIS — E785 Hyperlipidemia, unspecified: Secondary | ICD-10-CM

## 2017-07-13 DIAGNOSIS — I420 Dilated cardiomyopathy: Secondary | ICD-10-CM

## 2017-07-13 DIAGNOSIS — G4733 Obstructive sleep apnea (adult) (pediatric): Secondary | ICD-10-CM

## 2017-07-13 DIAGNOSIS — Z9989 Dependence on other enabling machines and devices: Secondary | ICD-10-CM

## 2017-07-13 DIAGNOSIS — I10 Essential (primary) hypertension: Secondary | ICD-10-CM | POA: Diagnosis not present

## 2017-07-13 DIAGNOSIS — R600 Localized edema: Secondary | ICD-10-CM | POA: Diagnosis not present

## 2017-07-13 DIAGNOSIS — I482 Chronic atrial fibrillation: Secondary | ICD-10-CM

## 2017-07-13 DIAGNOSIS — I4821 Permanent atrial fibrillation: Secondary | ICD-10-CM

## 2017-07-13 DIAGNOSIS — I25118 Atherosclerotic heart disease of native coronary artery with other forms of angina pectoris: Secondary | ICD-10-CM

## 2017-07-13 DIAGNOSIS — I5042 Chronic combined systolic (congestive) and diastolic (congestive) heart failure: Secondary | ICD-10-CM

## 2017-07-13 MED ORDER — FUROSEMIDE 80 MG PO TABS: 80.0000 mg | ORAL_TABLET | Freq: Two times a day (BID) | ORAL | 6 refills | Status: DC

## 2017-07-13 MED ORDER — EZETIMIBE 10 MG PO TABS: 10.0000 mg | ORAL_TABLET | Freq: Every day | ORAL | 6 refills | Status: DC

## 2017-07-13 MED ORDER — ATORVASTATIN CALCIUM 40 MG PO TABS: 40.0000 mg | ORAL_TABLET | Freq: Every day | ORAL | 6 refills | Status: DC

## 2017-07-13 MED ORDER — ELIQUIS 5 MG PO TABS: 5.0000 mg | ORAL_TABLET | Freq: Two times a day (BID) | ORAL | 6 refills | Status: DC

## 2017-07-13 MED ORDER — IRBESARTAN 300 MG PO TABS: 300.0000 mg | ORAL_TABLET | Freq: Every day | ORAL | 6 refills | Status: DC

## 2017-07-13 MED ORDER — NEBIVOLOL HCL 10 MG PO TABS: 10.0000 mg | ORAL_TABLET | Freq: Every day | ORAL | 6 refills | Status: DC

## 2017-07-13 MED ORDER — NITROGLYCERIN 0.4 MG SL SUBL: SUBLINGUAL_TABLET | SUBLINGUAL | 6 refills | Status: DC

## 2017-07-13 MED ORDER — ISOSORBIDE MONONITRATE ER 60 MG PO TB24: 60.0000 mg | ORAL_TABLET | Freq: Every day | ORAL | 6 refills | Status: DC

## 2017-07-13 MED ORDER — PANTOPRAZOLE SODIUM 40 MG PO TBEC: 40.0000 mg | DELAYED_RELEASE_TABLET | Freq: Every day | ORAL | 6 refills | Status: DC

## 2017-07-13 NOTE — Patient Instructions (Signed)
Weigh daily   If you gain more than 3 lbs take a extra Lasix until your weight goes back to baseline     Wear support stockings   Continue going to gym   Check with PCP about starting [place] wants you to follow-up in 6 months. You will receive a reminder letter in the mail two months in advance. If you don't receive a letter, please call our office to schedule the follow-up appointment.

## 2017-07-13 NOTE — Progress Notes (Signed)
PCP: Lin Landsman, MD  Clinic Note: Chief Complaint  Patient presents with  . Follow-up    Pt did not express any complaints   . Coronary Artery Disease    No recurrent angina  . Cardiomyopathy    Relatively well-controlled swelling/weight    HPI: Stephanie Franco is a 67 y.o. female with a PMH below who presents today for ~ 27month f/u for CAD-PC back in 2006 with patent stent in 2013.  She has had any history of Cardiomyopathy (EF roughly 45%, but was previously down to 25 -- presumably combination of both ischemic and nonischemic ) & PAF.  Last stress test was in 2016 when she had onset of A. fib.  No evidence of ischemia or infarction.  Last echo was dated September 2016.  EF was 40-45% with moderate pulmonary hypertension.  Wall motion abnormality not explained by her known anatomy.  Stephanie Franco was last seen on January 14, 2017.  No major complaints from a cardiac standpoint.  She had a prolonged episode of bronchitis back in May the took a long time to recover from.  She noted occasional exertional dyspnea and fast.  Otherwise no symptoms.  Recent Hospitalizations:   ER visit for dyspnea on exertion February 20, 2017 -BNP level was 200.  Mild vascular congestion on chest x-ray.  She was given 1 dose of IV Lasix and discharged home.  Plan was for outpatient follow-up -she saw her PCP theoretically.  (was not taking Lasix according to plan).    Studies Personally Reviewed - (if available, images/films reviewed: From Epic Chart or Care Everywhere)  None  Interval History: Stephanie Franco presents today overall doing better.  She still has some up-and-down days of shortness of breath, but she has not necessarily been taking her Lasix doses all the time with a weight-based sliding scale.  She does take it occasionally for symptoms or worsening edema in which case she takes a third dose during the course the day.  Her husband tried her into admitting that they probably have not been  eating as well through the "eating season of November through December and she has gained some weight as a result.  They have now just joined a fitness club or at least he has and is trying to get her to go.  We did try to do exercise building up to doing stairstepper's etc.  But she is not yet to that stage.  She is leery of doing a lot of vigorous exercise because of hip pain.  She did get an injection and that seemed to make it feel better. She really only notices palpitations if she is overexerting or if she has had excess fluid volume.  For instance when she went to the emergency room in October she felt palpitations.  That is 1 of her cues to know that she needs to take additional doses of Lasix.  Probably more due to deconditioning than anything else, she does still get some exertional dyspnea.  No chest pain or pressure with rest or exertion.  She does not sleep flat, and has been using a wedge pillow for a while as well as CPAP.  She does not really lie down much without the CPAP,.  With the CPAP no orthopnea or PND.  Her swelling comes and goes, but is usually relatively well controlled and she does take occasional extra dose of Lasix for swelling.  She denies any syncope/near syncope or TIA/amaurosis fugax.  No melena, hematochezia, hematuria  or epistaxis. No claudication.   ROS: A comprehensive was performed. Review of Systems  Constitutional: Negative for malaise/fatigue and weight loss (Unfortunately, has not been eating as healthy -- snacking on cookies & candy).  HENT: Negative for congestion.   Respiratory: Negative for cough and wheezing.        No recent colds  Gastrointestinal: Negative for abdominal pain and heartburn.  Genitourinary: Negative for frequency.  Musculoskeletal: Positive for joint pain (Hip & knees). Negative for falls.  Neurological: Positive for dizziness (occasionally with standing up too fast). Negative for weakness.  Psychiatric/Behavioral: The patient does  not have insomnia.   All other systems reviewed and are negative.  I have reviewed and (if needed) personally updated the patient's problem list, medications, allergies, past medical and surgical history, social and family history.   Past Medical History:  Diagnosis Date  . Asthma   . Atrial fibrillation, permanent ([place])    Rate control with Bystolic. CHA2DS2Vasc = 6 (HTN, DM, CHF, Age 28, Female) -> on Pradaxa  . Breast cancer ([place]) 2008   S/P mastectomy  . CAD S/P percutaneous coronary angioplasty 2006;    PCI of circumflex with Taxus DES;; relook-cath Feb 2013: 50-60% short lesion in RCA, 40% ISR Circumflex stent.  . CKD (chronic kidney disease), stage III ([place])   . Diabetes mellitus, uncontrolled 07/14/2011  . Dilated cardiomyopathy ([place])    Severely depressed LV systolic function with EF of 25% range. Very limited study. Diffuse hypokinesis.  . Edema of both legs    Usually mild, chronic. Controlled with when necessary furosemide and diet  . 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 Gout   . Hyperlipidemia   . Hypertension   . Morbid obesity (HCC)    BMI 41  . OSA on CPAP    On CPAP    Past Surgical History:  Procedure Laterality Date  . CORONARY ANGIOPLASTY WITH STENT PLACEMENT  2006   stent to circumflex  . LEFT HEART CATHETERIZATION WITH CORONARY ANGIOGRAM N/A 07/13/2011   Procedure: LEFT HEART CATHETERIZATION WITH CORONARY ANGIOGRAM;  Surgeon: Leonie Man, MD;  Location: [facility] C[facility] LAB;  Service: Cardiovascular;  normal EF; 38mm 50-60% lesion in RCA; patent stent in circumflex form 2006 with ~40% in-stent re-stenosis  . MASTECTOMY  2008   left  . NM MYOVIEW LTD  11/22/2014   Low Risk. No ischemia or infarction. Not gated 2/2  A. fib - unable to assess EF  . TRANSTHORACIC ECHOCARDIOGRAM  01/2015   LV EF 40-45%, Mild Anteroseptal HK. Mod-Severe  RA dilation with elevated PA Pressures (~~peak 63 mmHg).. Mild LA dilation  . TRANSTHORACIC ECHOCARDIOGRAM  June 2015   In  setting of Urosepsis: EF ~25 %, global HK (poor quality study)    Current Meds  Medication Sig  . albuterol (PROVENTIL HFA;VENTOLIN HFA) 108 (90 Base) MCG/ACT inhaler Inhale 1-2 puffs into the lungs every 6 (six) hours as needed for wheezing or shortness of breath.  Marland Kitchen albuterol (PROVENTIL) (2.5 MG/3ML) 0.083% nebulizer solution Take 3 mLs (2.5 mg total) by nebulization every 6 (six) hours as needed for wheezing or shortness of breath.  . allopurinol (ZYLOPRIM) 300 MG tablet Take 300 mg by mouth daily as needed (for gout flares). Gout  . atorvastatin (LIPITOR) 40 MG tablet Take 1 tablet (40 mg total) by mouth daily.  . cholecalciferol (VITAMIN D) 1000 UNITS tablet Take 2,000 Units by mouth daily.  . colchicine 0.6 MG tablet Take 0.6 mg by mouth  daily as needed. Take 2 tablets intially, then 1 tablet in 2 hours , Then 1 tablet daily For gout  . diclofenac (VOLTAREN) 75 MG EC tablet Take 1 tablet by mouth 2 (two) times daily.  Marland Kitchen ELIQUIS 5 MG TABS tablet Take 1 tablet (5 mg total) by mouth 2 (two) times daily.  Marland Kitchen ezetimibe (ZETIA) 10 MG tablet Take 1 tablet (10 mg total) by mouth daily.  . ferrous sulfate 325 (65 FE) MG EC tablet Take 325 mg by mouth daily with breakfast.  . fexofenadine (ALLEGRA) 180 MG tablet Take 180 mg by mouth daily as needed for allergies.   . fluticasone (FLONASE) 50 MCG/ACT nasal spray Place 2 sprays into the nose as needed for rhinitis.  . furosemide (LASIX) 80 MG tablet Take 1 tablet (80 mg total) by mouth 2 (two) times daily.  Marland Kitchen glucose blood (ACCU-CHEK SMARTVIEW) test strip 1 each by Other route as needed for other. Use as instructed  . Insulin Glargine (TOUJEO SOLOSTAR) 300 UNIT/ML SOPN Inject 16 Units into the skin every evening.  . irbesartan (AVAPRO) 300 MG tablet Take 1 tablet (300 mg total) by mouth daily.  . isosorbide mononitrate (IMDUR) 60 MG 24 hr tablet Take 1 tablet (60 mg total) by mouth daily.  Marland Kitchen KLOR-CON M20 20 MEQ tablet TAKE 1 TABLET BY MOUTH TWICE A  DAY  . lidocaine (LIDODERM) 5 % Place 1 patch onto the skin as needed.  Marland Kitchen LORazepam (ATIVAN) 1 MG tablet Take 1 mg by mouth daily.  . Multiple Vitamins-Minerals (MULTIVITAMIN WITH MINERALS) tablet Take 1 tablet by mouth daily.  . nebivolol (BYSTOLIC) 10 MG tablet Take 1 tablet (10 mg total) by mouth daily.  . nitroGLYCERIN (NITROSTAT) 0.4 MG SL tablet DISSOLVE 1 TABLET (0.4 MG TOTAL) UNDER THE TONGUE EVERY 5 (FIVE) MINUTES AS NEEDED FOR CHEST PAIN.  Marland Kitchen pantoprazole (PROTONIX) 40 MG tablet Take 1 tablet (40 mg total) by mouth daily.  . [DISCONTINUED] atorvastatin (LIPITOR) 40 MG tablet Take 1 tablet (40 mg total) by mouth daily.  . [DISCONTINUED] BYSTOLIC 10 MG tablet TAKE 1 TABLET BY MOUTH EVERY DAY  . [DISCONTINUED] ELIQUIS 5 MG TABS tablet Take 1 tablet by mouth 2 (two) times daily.  . [DISCONTINUED] ezetimibe (ZETIA) 10 MG tablet TAKE 1 TABLET BY MOUTH EVERY DAY  . [DISCONTINUED] furosemide (LASIX) 80 MG tablet Take 1 tablet (80 mg total) by mouth 2 (two) times daily.  . [DISCONTINUED] irbesartan (AVAPRO) 300 MG tablet Take 1 tablet (300 mg total) by mouth daily.  . [DISCONTINUED] isosorbide mononitrate (IMDUR) 60 MG 24 hr tablet TAKE 1 TABLET BY MOUTH EVERY DAY  . [DISCONTINUED] NITROSTAT 0.4 MG SL tablet DISSOLVE 1 TABLET (0.4 MG TOTAL) UNDER THE TONGUE EVERY 5 (FIVE) MINUTES AS NEEDED FOR CHEST PAIN.  . [DISCONTINUED] pantoprazole (PROTONIX) 40 MG tablet TAKE 1 TABLET BY MOUTH EVERY DAY    No Known Allergies  Social History   Tobacco Use  . Smoking status: Former Smoker    Packs/day: 0.50    Years: 6.00    Pack years: 3.00    Types: Cigarettes    Last attempt to quit: 05/18/1992    Years since quitting: 25.1  . Smokeless tobacco: Never Used  Substance Use Topics  . Alcohol use: No    Comment: 07/13/11 "have drank occasionally; not now"  . Drug use: No   Social History   Social History Narrative   She is a married mother of 73, grandmother 2. Usually accompanied by her  husband.  She does not work. She had been working on her exercise, but is no longer as active. Does not drink and does not smoke    family history includes Atrial fibrillation in her son; Coronary artery disease in her mother; Hypertension in her mother.  Wt Readings from Last 3 Encounters:  07/13/17 246 lb 3.2 oz (111.7 kg)  01/14/17 238 lb (108 kg)  07/16/16 243 lb (110.2 kg)    PHYSICAL EXAM BP 126/78 (BP Location: Right Arm, Patient Position: Sitting, Cuff Size: Large)   Pulse (!) 57   Ht 5' (1.524 m)   Wt 246 lb 3.2 oz (111.7 kg)   SpO2 98%   BMI 48.08 kg/m  Physical Exam  Constitutional: She is oriented to person, place, and time. She appears well-developed and well-nourished.  Well groomed.    HENT:  Head: Normocephalic and atraumatic.  Mouth/Throat: Oropharynx is clear and moist. No oropharyngeal exudate.  Neck: Neck supple. No hepatojugular reflux and no JVD present. Carotid bruit is not present.  Cardiovascular: Normal rate, normal heart sounds and intact distal pulses. An irregular rhythm present. Exam reveals no gallop and no friction rub.  No murmur heard. Pulmonary/Chest: Effort normal and breath sounds normal. No respiratory distress. She has no wheezes. She has no rales. She exhibits no tenderness.  Abdominal: Soft. Bowel sounds are normal. She exhibits no distension. There is no tenderness. There is no rebound and no guarding.  Musculoskeletal: Normal range of motion. She exhibits edema (~1 + BLE).  Neurological: She is alert and oriented to person, place, and time.  Skin: Skin is warm and dry. No rash noted. No erythema.  Psychiatric: She has a normal mood and affect. Her behavior is normal. Judgment and thought content normal.  Nursing note and vitals reviewed.   Adult ECG Report n/a  Other studies Reviewed: Additional studies/ records that were reviewed today include:  Recent Labs:  Lab Results  Component Value Date   CHOL 117 05/19/2017   HDL 36 (L) 05/19/2017     LDLCALC 55 10/22/2016   TRIG 113 05/19/2017   CHOLHDL 3.3 05/19/2017   LDLc 61  Lab Results  Component Value Date   CREATININE 1.36 (H) 07/06/2017   BUN 28 (H) 07/06/2017   NA 144 07/06/2017   K 3.8 07/06/2017   CL 105 07/06/2017   CO2 24 07/06/2017    ASSESSMENT / PLAN: Problem List Items Addressed This Visit    Atrial fibrillation, permanent ([place]); CHA2DS2Vasc - 6 (Chronic)    She sounds like she may be in A. fib today, but totally asymptomatic.  She remains on Bystolic for rate control and has had no sensation of rapid beats.  She is on Eliquis for anticoagulation without any bleeding issues.      Relevant Medications   atorvastatin (LIPITOR) 40 MG tablet   nebivolol (BYSTOLIC) 10 MG tablet   ELIQUIS 5 MG TABS tablet   ezetimibe (ZETIA) 10 MG tablet   furosemide (LASIX) 80 MG tablet   irbesartan (AVAPRO) 300 MG tablet   isosorbide mononitrate (IMDUR) 60 MG 24 hr tablet   nitroGLYCERIN (NITROSTAT) 0.4 MG SL tablet   CAD (coronary artery disease), nonobstructive CAD by cath 07/13/11 - Primary    She had PCI back in 2006 to the circumflex and has had a negative ischemic evaluation with negative cath since.  (Nonischemic Myoview in July 2016 following cath with only moderate disease in 2013) no further angina symptoms.  Remains on beta-blocker, ARB, statin/Eliquis and  Imdur. Not on aspirin or Plavix because she is now on Eliquis for essentially permanent A. Fib.  Continue current management given no angina.  No use of nitroglycerin.      Relevant Medications   atorvastatin (LIPITOR) 40 MG tablet   nebivolol (BYSTOLIC) 10 MG tablet   ELIQUIS 5 MG TABS tablet   ezetimibe (ZETIA) 10 MG tablet   furosemide (LASIX) 80 MG tablet   irbesartan (AVAPRO) 300 MG tablet   isosorbide mononitrate (IMDUR) 60 MG 24 hr tablet   nitroGLYCERIN (NITROSTAT) 0.4 MG SL tablet   Chronic combined systolic and diastolic heart failure, NYHA class 2 (HCC) (Chronic)    She does have mild to  moderately reduced EF on echo with evidence of elevated PA pressures secondary to combined OHS and diastolic dysfunction.  Difficult to tell if it is truly pulmonary arterial versus pulmonary venous hypertension.  Plan: She seems relatively euvolemic, may be a few pounds up.  I think she is probably 4 pounds up from her baseline weight. I want her to weigh herself daily and do sliding scale Lasix based on weight gain more than 3 pounds.  I told her to take 80 mg twice daily Lasix and wear support hose.  If her weight goes up more than 3 pounds i.e. she is currently probably 4 pounds up) she should take 160 mg in the morning and 80 in the afternoon.  --I would like for her to discuss the possibility of starting Jardiance or Invokana for diabetes management which will also help with some of her diuresis (and probably weight loss)  Continue current dose of Avapro and Bystolic as her pressures look relatively stable.      Relevant Medications   atorvastatin (LIPITOR) 40 MG tablet   nebivolol (BYSTOLIC) 10 MG tablet   ELIQUIS 5 MG TABS tablet   ezetimibe (ZETIA) 10 MG tablet   furosemide (LASIX) 80 MG tablet   irbesartan (AVAPRO) 300 MG tablet   isosorbide mononitrate (IMDUR) 60 MG 24 hr tablet   nitroGLYCERIN (NITROSTAT) 0.4 MG SL tablet   Congestive dilated cardiomyopathy (HCC) (Chronic)    She has definitely had improved EF and follow-up evaluations, still remains mild to moderately reduced EF all told.  She is on a decent dose of Bystolic and Avapro with standing dose of Lasix. I have explained to her sliding scale Lasix in the past and she was doing okay for a while, she is no longer doing that.  I told her I want her weight herself daily and get back onto the routine.  I think this probably how she got in trouble this past October.      Relevant Medications   atorvastatin (LIPITOR) 40 MG tablet   nebivolol (BYSTOLIC) 10 MG tablet   ELIQUIS 5 MG TABS tablet   ezetimibe (ZETIA) 10 MG  tablet   furosemide (LASIX) 80 MG tablet   irbesartan (AVAPRO) 300 MG tablet   isosorbide mononitrate (IMDUR) 60 MG 24 hr tablet   nitroGLYCERIN (NITROSTAT) 0.4 MG SL tablet   Edema of both legs (Chronic)    She seems to be maybe 3 or 4 pounds up today.  I wonder set her goal weight for about 3 pounds down when she is now.  She will take a few extra dose of Lasix over the next couple days and then back down to twice daily.  Recommended wearing support stockings.  Also recommend considering switching to diabetes medication such as Invokana or Jardiance for additional glycosuresis  Essential hypertension (Chronic)    Pressure looks pretty good today.  Seems to be doing okay without the HCTZ.  But that may have been where she got into trouble from a volume standpoint last year.  She is therefore on a higher dose of diuretic.      Relevant Medications   atorvastatin (LIPITOR) 40 MG tablet   nebivolol (BYSTOLIC) 10 MG tablet   ELIQUIS 5 MG TABS tablet   ezetimibe (ZETIA) 10 MG tablet   furosemide (LASIX) 80 MG tablet   irbesartan (AVAPRO) 300 MG tablet   isosorbide mononitrate (IMDUR) 60 MG 24 hr tablet   nitroGLYCERIN (NITROSTAT) 0.4 MG SL tablet   Hyperlipidemia with target LDL less than 70 (Chronic)    Lipids were just checked and LDL looks great at 55 on current dose of atorvastatin.  I would prefer to stay at this dose.  Target LDL really should be closer to 81 anyway for someone with CAD given new guidelines. Continue Zetia for additional benefit.      Relevant Medications   atorvastatin (LIPITOR) 40 MG tablet   nebivolol (BYSTOLIC) 10 MG tablet   ELIQUIS 5 MG TABS tablet   ezetimibe (ZETIA) 10 MG tablet   furosemide (LASIX) 80 MG tablet   irbesartan (AVAPRO) 300 MG tablet   isosorbide mononitrate (IMDUR) 60 MG 24 hr tablet   nitroGLYCERIN (NITROSTAT) 0.4 MG SL tablet   Morbid obesity (HCC) (Chronic)   OSA on CPAP (Chronic)    Back on CPAP now.  Hopefully this will help  avoid worsening pulmonary hypertension         Current medicines are reviewed at length with the patient today. (+/- concerns) none The following changes have been made:See below  Patient Instructions  Weigh daily   If you gain more than 3 lbs take a extra Lasix until your weight goes back to baseline     Wear support stockings   Continue going to gym   Check with PCP about starting [place] wants you to follow-up in 6 months. You will receive a reminder letter in the mail two months in advance. If you don't receive a letter, please call our office to schedule the follow-up appointment.      Studies Ordered:   No orders of the defined types were placed in this encounter.     Glenetta Hew, M.D., M.S. Interventional Cardiologist   Pager # [phone] Phone # [phone] [address] [place], [place] [address]

## 2017-07-14 ENCOUNTER — Encounter: Payer: Self-pay | Admitting: Cardiology

## 2017-07-14 NOTE — Assessment & Plan Note (Signed)
She has definitely had improved EF and follow-up evaluations, still remains mild to moderately reduced EF all told.  She is on a decent dose of Bystolic and Avapro with standing dose of Lasix. I have explained to her sliding scale Lasix in the past and she was doing okay for a while, she is no longer doing that.  I told her I want her weight herself daily and get back onto the routine.  I think this probably how she got in trouble this past October.

## 2017-07-14 NOTE — Assessment & Plan Note (Signed)
Pressure looks pretty good today.  Seems to be doing okay without the HCTZ.  But that may have been where she got into trouble from a volume standpoint last year.  She is therefore on a higher dose of diuretic.

## 2017-07-14 NOTE — Assessment & Plan Note (Signed)
Back on CPAP now.  Hopefully this will help avoid worsening pulmonary hypertension

## 2017-07-14 NOTE — Assessment & Plan Note (Addendum)
She had PCI back in 2006 to the circumflex and has had a negative ischemic evaluation with negative cath since.  (Nonischemic Myoview in July 2016 following cath with only moderate disease in 2013) no further angina symptoms.  Remains on beta-blocker, ARB, statin/Eliquis and Imdur. Not on aspirin or Plavix because she is now on Eliquis for essentially permanent A. Fib.  Continue current management given no angina.  No use of nitroglycerin.

## 2017-07-14 NOTE — Assessment & Plan Note (Addendum)
She does have mild to moderately reduced EF on echo with evidence of elevated PA pressures secondary to combined OHS and diastolic dysfunction.  Difficult to tell if it is truly pulmonary arterial versus pulmonary venous hypertension.  Plan: She seems relatively euvolemic, may be a few pounds up.  I think she is probably 4 pounds up from her baseline weight. I want her to weigh herself daily and do sliding scale Lasix based on weight gain more than 3 pounds.  I told her to take 80 mg twice daily Lasix and wear support hose.  If her weight goes up more than 3 pounds i.e. she is currently probably 4 pounds up) she should take 160 mg in the morning and 80 in the afternoon.  --I would like for her to discuss the possibility of starting Jardiance or Invokana for diabetes management which will also help with some of her diuresis (and probably weight loss)  Continue current dose of Avapro and Bystolic as her pressures look relatively stable.

## 2017-07-14 NOTE — Assessment & Plan Note (Signed)
Lipids were just checked and LDL looks great at 55 on current dose of atorvastatin.  I would prefer to stay at this dose.  Target LDL really should be closer to 12 anyway for someone with CAD given new guidelines. Continue Zetia for additional benefit.

## 2017-07-14 NOTE — Assessment & Plan Note (Signed)
She sounds like she may be in A. fib today, but totally asymptomatic.  She remains on Bystolic for rate control and has had no sensation of rapid beats.  She is on Eliquis for anticoagulation without any bleeding issues.

## 2017-07-14 NOTE — Assessment & Plan Note (Signed)
She seems to be maybe 3 or 4 pounds up today.  I wonder set her goal weight for about 3 pounds down when she is now.  She will take a few extra dose of Lasix over the next couple days and then back down to twice daily.  Recommended wearing support stockings.  Also recommend considering switching to diabetes medication such as Invokana or Jardiance for additional glycosuresis

## 2017-07-15 ENCOUNTER — Ambulatory Visit: Payer: Medicare Other | Admitting: Cardiology

## 2017-12-10 ENCOUNTER — Encounter: Payer: Self-pay | Admitting: Cardiology

## 2017-12-10 ENCOUNTER — Ambulatory Visit (INDEPENDENT_AMBULATORY_CARE_PROVIDER_SITE_OTHER): Payer: Medicare Other | Admitting: Cardiology

## 2017-12-10 VITALS — BP 130/78 | HR 71 | Ht 63.0 in | Wt 229.2 lb

## 2017-12-10 DIAGNOSIS — I482 Chronic atrial fibrillation: Secondary | ICD-10-CM | POA: Diagnosis not present

## 2017-12-10 DIAGNOSIS — I420 Dilated cardiomyopathy: Secondary | ICD-10-CM | POA: Diagnosis not present

## 2017-12-10 DIAGNOSIS — I340 Nonrheumatic mitral (valve) insufficiency: Secondary | ICD-10-CM | POA: Insufficient documentation

## 2017-12-10 DIAGNOSIS — I10 Essential (primary) hypertension: Secondary | ICD-10-CM

## 2017-12-10 DIAGNOSIS — R600 Localized edema: Secondary | ICD-10-CM

## 2017-12-10 DIAGNOSIS — I5042 Chronic combined systolic (congestive) and diastolic (congestive) heart failure: Secondary | ICD-10-CM

## 2017-12-10 DIAGNOSIS — Z01810 Encounter for preprocedural cardiovascular examination: Secondary | ICD-10-CM | POA: Diagnosis not present

## 2017-12-10 DIAGNOSIS — I2510 Atherosclerotic heart disease of native coronary artery without angina pectoris: Secondary | ICD-10-CM | POA: Diagnosis not present

## 2017-12-10 DIAGNOSIS — I4821 Permanent atrial fibrillation: Secondary | ICD-10-CM

## 2017-12-10 DIAGNOSIS — Z9861 Coronary angioplasty status: Secondary | ICD-10-CM | POA: Diagnosis not present

## 2017-12-10 DIAGNOSIS — R011 Cardiac murmur, unspecified: Secondary | ICD-10-CM

## 2017-12-10 DIAGNOSIS — E785 Hyperlipidemia, unspecified: Secondary | ICD-10-CM

## 2017-12-10 DIAGNOSIS — R0609 Other forms of dyspnea: Secondary | ICD-10-CM

## 2017-12-10 MED ORDER — NEBIVOLOL HCL 10 MG PO TABS: 10.0000 mg | ORAL_TABLET | Freq: Every day | ORAL | 11 refills | Status: DC

## 2017-12-10 MED ORDER — EZETIMIBE 10 MG PO TABS: 10.0000 mg | ORAL_TABLET | Freq: Every day | ORAL | 11 refills | Status: DC

## 2017-12-10 MED ORDER — FUROSEMIDE 80 MG PO TABS: 80.0000 mg | ORAL_TABLET | Freq: Two times a day (BID) | ORAL | 11 refills | Status: DC

## 2017-12-10 MED ORDER — POTASSIUM CHLORIDE CRYS ER 20 MEQ PO TBCR: 20.0000 meq | EXTENDED_RELEASE_TABLET | Freq: Two times a day (BID) | ORAL | 11 refills | Status: DC

## 2017-12-10 MED ORDER — ISOSORBIDE MONONITRATE ER 60 MG PO TB24: 60.0000 mg | ORAL_TABLET | Freq: Every day | ORAL | 6 refills | Status: DC

## 2017-12-10 MED ORDER — PANTOPRAZOLE SODIUM 40 MG PO TBEC: 40.0000 mg | DELAYED_RELEASE_TABLET | Freq: Every day | ORAL | 6 refills | Status: DC

## 2017-12-10 MED ORDER — ATORVASTATIN CALCIUM 40 MG PO TABS: 40.0000 mg | ORAL_TABLET | Freq: Every day | ORAL | 11 refills | Status: DC

## 2017-12-10 NOTE — Patient Instructions (Addendum)
Medication Instructions:  Your physician recommends that you continue on your current medications as directed. Please refer to the Current Medication list given to you today.  Testing/Procedures: Your physician has requested that you have an echocardiogram. Echocardiography is a painless test that uses sound waves to create images of your heart. It provides your doctor with information about the size and shape of your heart and how well your heart's chambers and valves are working. This procedure takes approximately one hour. There are no restrictions for this procedure.  This will be done at our [facility] location:  [place] wants you to follow-up in: 6 months with Dr. Ellyn Hack. You will receive a reminder letter in the mail two months in advance. If you don't receive a letter, please call our office to schedule the follow-up appointment.  Any Other Special Instructions Will Be Listed Below (If Applicable).  Once surgery is scheduled, please have surgeon send clearance request to office to address Ok to hold Eliquis 2-3 priors to surgery-restart per surgeon   If you need a refill on your cardiac medications before your next appointment, please call your pharmacy.

## 2017-12-10 NOTE — Progress Notes (Signed)
PCP: Lin Landsman, MD  Clinic Note: Chief Complaint  Patient presents with  . Pre-op Exam    Hip surgery  . Follow-up    Cardiomyopathy, edema and exertional dyspnea notably improved -     HPI: Stephanie Franco is a 67 y.o. female with a PMH below who presents today for preop evaluation for hip surgery --this is 4 month f/u for CAD-PC back in 2006 with patent stent in 2013.    She has had any history of Cardiomyopathy (EF roughly 45%, but was previously down to 25 -- presumably combination of both ischemic and nonischemic ) & PAF.  Last stress test was in 2016 when she had onset of A. fib.  No evidence of ischemia or infarction.  Last echo was dated September 2016.  EF was 40-45% with moderate pulmonary hypertension.  Wall motion abnormality not explained by her known anatomy.  Stephanie Franco was last seen on July 13, 2016 -- she noted that she is feeling much better .  She says she had up days and down days was for shortness of breath goes.  She was only taking Lasix on an as-needed basis at that time.   She acknowledged that she may have gained some weight because of eating over the holidays, exercise was limited due to hip pain...  - Trying to lose wgt to try to get Hip Sgx - needs 5 more Lb.  (already down from 246 to 229 lb).  Recent Hospitalizations:   none  Studies Personally Reviewed - (if available, images/films reviewed: From Epic Chart or Care Everywhere)  None  Interval History: Stephanie Franco presents today for the most part pretty well.  Her dyspnea on exertion is definitely improved with having lost weight.  She is lost quite a bit of weight since I saw her, but still has to lose a total of 10 pounds before she get her hip done.  She is about 6 pounds down and is 4 more pounds.  So she is hoping that surgery can happen soon.  She really notes that her exercise is limited by hip pain and not by dyspnea.  She had one episode of swelling acute few weeks ago where she  said that her feet ballooned up quite a bit.  But she realized that her non-soft drink beverage was high in sodium when she cut that out, her swelling went down - after a couple days with lasix. She is trying to exercise now but really is to the point where she needs to have her hip done.  Only has rare palpitations but nothing prolonged.  No rapid palpitations.  No syncope/near syncope or TIA symptoms fugax.  No PND, orthopnea --and uses CPAP routinely.  No melena, hematochezia, hematuria or epistaxis. No claudication.   ROS: A comprehensive was performed. Review of Systems  Constitutional: Positive for weight loss (doing better with diet & trying to exercise.). Negative for malaise/fatigue.  HENT: Negative for congestion.   Respiratory: Negative for cough and wheezing.        No recent colds  Cardiovascular: Negative for claudication.  Gastrointestinal: Negative for abdominal pain and heartburn.  Genitourinary: Negative for frequency.  Musculoskeletal: Positive for joint pain (Hip & knees). Negative for falls.  Neurological: Positive for dizziness (occasionally with standing up too fast). Negative for weakness.  Psychiatric/Behavioral: The patient does not have insomnia.   All other systems reviewed and are negative.  I have reviewed and (if needed) personally updated the patient's problem list,  medications, allergies, past medical and surgical history, social and family history.   Past Medical History:  Diagnosis Date  . Asthma   . Atrial fibrillation, permanent ([place])    Rate control with Bystolic. CHA2DS2Vasc = 6 (HTN, DM, CHF, Age 8, Female) -> on Pradaxa  . Breast cancer ([place]) 2008   S/P mastectomy  . CAD S/P percutaneous coronary angioplasty 2006;    PCI of circumflex with Taxus DES;; relook-cath Feb 2013: 50-60% short lesion in RCA, 40% ISR Circumflex stent.  . CKD (chronic kidney disease), stage III ([place])   . Diabetes mellitus, uncontrolled 07/14/2011  . Dilated  cardiomyopathy ([place])    Severely depressed LV systolic function with EF of 25% range. Very limited study. Diffuse hypokinesis.  . Edema of both legs    Usually mild, chronic. Controlled with when necessary furosemide and diet  . 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 Gout   . Hyperlipidemia   . Hypertension   . Morbid obesity (HCC)    BMI 41  . OSA on CPAP    On CPAP    Past Surgical History:  Procedure Laterality Date  . CORONARY ANGIOPLASTY WITH STENT PLACEMENT  2006   stent to circumflex  . LEFT HEART CATHETERIZATION WITH CORONARY ANGIOGRAM N/A 07/13/2011   Procedure: LEFT HEART CATHETERIZATION WITH CORONARY ANGIOGRAM;  Surgeon: Leonie Man, MD;  Location: [facility] CATH LAB;  Service: Cardiovascular;  normal EF; 39mm 50-60% lesion in RCA; patent stent in circumflex form 2006 with ~40% in-stent re-stenosis  . MASTECTOMY  2008   left  . NM MYOVIEW LTD  11/22/2014   Low Risk. No ischemia or infarction. Not gated 2/2  A. fib - unable to assess EF  . TRANSTHORACIC ECHOCARDIOGRAM  01/2015   LV EF 40-45%, Mild Anteroseptal HK. Mod-Severe  RA dilation with elevated PA Pressures (~~peak 63 mmHg).. Mild LA dilation  . TRANSTHORACIC ECHOCARDIOGRAM  June 2015   In setting of Urosepsis: EF ~25 %, global HK (poor quality study)     Current Meds  Medication Sig  . albuterol (PROVENTIL HFA;VENTOLIN HFA) 108 (90 Base) MCG/ACT inhaler Inhale 1-2 puffs into the lungs every 6 (six) hours as needed for wheezing or shortness of breath.  Marland Kitchen albuterol (PROVENTIL) (2.5 MG/3ML) 0.083% nebulizer solution Take 3 mLs (2.5 mg total) by nebulization every 6 (six) hours as needed for wheezing or shortness of breath.  . allopurinol (ZYLOPRIM) 300 MG tablet Take 300 mg by mouth daily as needed (for gout flares). Gout  . atorvastatin (LIPITOR) 40 MG tablet Take 1 tablet (40 mg total) by mouth daily.  . colchicine 0.6 MG tablet Take 0.6 mg by mouth daily as needed. Take 2 tablets intially, then 1 tablet in 2  hours , Then 1 tablet daily For gout  . diclofenac (VOLTAREN) 75 MG EC tablet Take 1 tablet by mouth 2 (two) times daily.  Marland Kitchen ELIQUIS 5 MG TABS tablet Take 1 tablet (5 mg total) by mouth 2 (two) times daily.  Marland Kitchen ezetimibe (ZETIA) 10 MG tablet Take 1 tablet (10 mg total) by mouth daily.  . ferrous sulfate 325 (65 FE) MG EC tablet Take 325 mg by mouth daily with breakfast.  . fexofenadine (ALLEGRA) 180 MG tablet Take 180 mg by mouth daily as needed for allergies.   . fluticasone (FLONASE) 50 MCG/ACT nasal spray Place 2 sprays into the nose as needed for rhinitis.  . furosemide (LASIX) 80 MG tablet Take 1 tablet (80 mg total) by  mouth 2 (two) times daily.  Marland Kitchen glucose blood (ACCU-CHEK SMARTVIEW) test strip 1 each by Other route as needed for other. Use as instructed  . Insulin Glargine (TOUJEO SOLOSTAR) 300 UNIT/ML SOPN Inject 16 Units into the skin every evening.  . irbesartan (AVAPRO) 300 MG tablet Take 1 tablet (300 mg total) by mouth daily.  . isosorbide mononitrate (IMDUR) 60 MG 24 hr tablet Take 1 tablet (60 mg total) by mouth daily.  Marland Kitchen lidocaine (LIDODERM) 5 % Place 1 patch onto the skin as needed.  Marland Kitchen LORazepam (ATIVAN) 1 MG tablet Take 1 mg by mouth daily.  . Multiple Vitamins-Minerals (MULTIVITAMIN WITH MINERALS) tablet Take 1 tablet by mouth daily.  . nebivolol (BYSTOLIC) 10 MG tablet Take 1 tablet (10 mg total) by mouth daily.  . nitroGLYCERIN (NITROSTAT) 0.4 MG SL tablet DISSOLVE 1 TABLET (0.4 MG TOTAL) UNDER THE TONGUE EVERY 5 (FIVE) MINUTES AS NEEDED FOR CHEST PAIN.  Marland Kitchen pantoprazole (PROTONIX) 40 MG tablet Take 1 tablet (40 mg total) by mouth daily.  . potassium chloride SA (KLOR-CON M20) 20 MEQ tablet Take 1 tablet (20 mEq total) by mouth 2 (two) times daily.  . [DISCONTINUED] atorvastatin (LIPITOR) 40 MG tablet Take 40 mg by mouth daily.  . [DISCONTINUED] ezetimibe (ZETIA) 10 MG tablet Take 1 tablet (10 mg total) by mouth daily.  . [DISCONTINUED] furosemide (LASIX) 80 MG tablet Take 1  tablet (80 mg total) by mouth 2 (two) times daily.  . [DISCONTINUED] isosorbide mononitrate (IMDUR) 60 MG 24 hr tablet Take 1 tablet (60 mg total) by mouth daily.  . [DISCONTINUED] KLOR-CON M20 20 MEQ tablet TAKE 1 TABLET BY MOUTH TWICE A DAY  . [DISCONTINUED] nebivolol (BYSTOLIC) 10 MG tablet Take 1 tablet (10 mg total) by mouth daily.  . [DISCONTINUED] pantoprazole (PROTONIX) 40 MG tablet Take 1 tablet (40 mg total) by mouth daily.    No Known Allergies  Social History   Tobacco Use  . Smoking status: Former Smoker    Packs/day: 0.50    Years: 6.00    Pack years: 3.00    Types: Cigarettes    Last attempt to quit: 05/18/1992    Years since quitting: 25.5  . Smokeless tobacco: Never Used  Substance Use Topics  . Alcohol use: No    Comment: 07/13/11 "have drank occasionally; not now"  . Drug use: No   Social History   Social History Narrative   She is a married mother of 92, grandmother 2. Usually accompanied by her husband. She does not work. She had been working on her exercise, but is no longer as active. Does not drink and does not smoke    family history includes Atrial fibrillation in her son; Coronary artery disease in her mother; Hypertension in her mother.  Wt Readings from Last 3 Encounters:  12/10/17 229 lb 3.2 oz (104 kg)  07/13/17 246 lb 3.2 oz (111.7 kg)  01/14/17 238 lb (108 kg)    PHYSICAL EXAM BP 130/78   Pulse 71   Ht 5\' 3"  (1.6 m)   Wt 229 lb 3.2 oz (104 kg)   SpO2 98%   BMI 40.60 kg/m  Physical Exam  Constitutional: She is oriented to person, place, and time. She appears well-developed and well-nourished.  Well groomed.    HENT:  Head: Normocephalic and atraumatic.  Mouth/Throat: Oropharynx is clear and moist. No oropharyngeal exudate.  Neck: Neck supple. No hepatojugular reflux and no JVD present. Carotid bruit is not present.  Cardiovascular: Normal rate,  S1 normal, S2 normal and intact distal pulses. An irregular rhythm present.  No  extrasystoles are present. PMI is not displaced (Unable to palpate). Exam reveals distant heart sounds. Exam reveals no gallop and no friction rub.  Murmur heard.  Harsh early systolic murmur is present with a grade of 1/6 at the upper right sternal border, upper left sternal border and lower left sternal border. Cannot tell (distant heart sounds) if the murmur is SE butM or HSM. Pulmonary/Chest: Effort normal and breath sounds normal. No respiratory distress. She has no wheezes. She has no rales. She exhibits no tenderness.  Abdominal: Soft. Bowel sounds are normal. She exhibits no distension. There is no tenderness. There is no rebound and no guarding.  Musculoskeletal: Normal range of motion. She exhibits edema (~1 + BLE).  Neurological: She is alert and oriented to person, place, and time.  Skin: Skin is warm and dry. No rash noted. No erythema.  Psychiatric: She has a normal mood and affect. Her behavior is normal. Judgment and thought content normal.  Nursing note and vitals reviewed.   Adult ECG Report n/a  Other studies Reviewed: Additional studies/ records that were reviewed today include:  Recent Labs:  Lab Results  Component Value Date   CHOL 117 05/19/2017   HDL 36 (L) 05/19/2017   LDLCALC 61 05/19/2017   TRIG 113 05/19/2017   CHOLHDL 3.3 05/19/2017   Lab Results  Component Value Date   CREATININE 1.36 (H) 07/06/2017   BUN 28 (H) 07/06/2017   NA 144 07/06/2017   K 3.8 07/06/2017   CL 105 07/06/2017   CO2 24 07/06/2017    ASSESSMENT / PLAN: Problem List Items Addressed This Visit    Atrial fibrillation, permanent ([place]); CHA2DS2Vasc - 6 (Chronic)    Pretty much permanent A. fib but asymptomatic.  This may contribute some to exertional dyspnea and she has not had an evaluation of her EF in some time.  I would like to check an echo prior to her surgery just to better understand her EF.   On Bystolic for rate control.  Anticoagulated with Eliquis.  No bleeding  concerns. ->  Okay to hold Eliquis 48 hours preop (IT DOES NOT NEED TO BE HELD LONGER)      Relevant Medications   atorvastatin (LIPITOR) 40 MG tablet   ezetimibe (ZETIA) 10 MG tablet   furosemide (LASIX) 80 MG tablet   isosorbide mononitrate (IMDUR) 60 MG 24 hr tablet   nebivolol (BYSTOLIC) 10 MG tablet   Other Relevant Orders   EKG 12-Lead   CAD S/P percutaneous coronary angioplasty (Chronic)    Distant history of CAD status post PCI 2005 back in [place].    Cardiac cath in February 2013 showed moderate RCA disease with mild to moderate in-stent restenosis in the RCA. Negative Myoview in 2016, and no recurrent anginal symptoms.  She is not on aspirin or Plavix because of being on Eliquis.  She is on statin And stable dose of Bystolic plus Imdur. On ARB.      Relevant Medications   atorvastatin (LIPITOR) 40 MG tablet   ezetimibe (ZETIA) 10 MG tablet   furosemide (LASIX) 80 MG tablet   isosorbide mononitrate (IMDUR) 60 MG 24 hr tablet   nebivolol (BYSTOLIC) 10 MG tablet   Other Relevant Orders   EKG 12-Lead   Chronic combined systolic and diastolic heart failure, NYHA class 2 (HCC) (Chronic)    Class I related to symptoms with mildly reduced EF on echocardiogram. Plan:  Recheck 2D echo to get an updated assessment of her EF. Provider this is okay, would have no reason to not proceed with surgery without any further evaluation. On ARB and beta-blocker with standing dose of Lasix 80 mg twice daily and sliding scale.  Maintaining stable weights even having lost weight.  I instructed her to make sure she adjusts her dry weight down. Potassium supplementation.      Relevant Medications   atorvastatin (LIPITOR) 40 MG tablet   ezetimibe (ZETIA) 10 MG tablet   furosemide (LASIX) 80 MG tablet   isosorbide mononitrate (IMDUR) 60 MG 24 hr tablet   nebivolol (BYSTOLIC) 10 MG tablet   Congestive dilated cardiomyopathy (HCC) (Chronic)   Relevant Medications   atorvastatin (LIPITOR) 40 MG  tablet   ezetimibe (ZETIA) 10 MG tablet   furosemide (LASIX) 80 MG tablet   isosorbide mononitrate (IMDUR) 60 MG 24 hr tablet   nebivolol (BYSTOLIC) 10 MG tablet   Other Relevant Orders   EKG 12-Lead   ECHOCARDIOGRAM COMPLETE   DOE (dyspnea on exertion)   Relevant Orders   EKG 12-Lead   Edema of both legs (Chronic)    Controlled with current dose of Lasix.  Not requiring lots of PRN dosing.      Essential hypertension (Chronic)    Blood pressure looks good on Bystolic and Avapro.  No longer on HCTZ since she is on standing dose of Lasix.      Relevant Medications   atorvastatin (LIPITOR) 40 MG tablet   ezetimibe (ZETIA) 10 MG tablet   furosemide (LASIX) 80 MG tablet   isosorbide mononitrate (IMDUR) 60 MG 24 hr tablet   nebivolol (BYSTOLIC) 10 MG tablet   Hyperlipidemia with target LDL less than 70 (Chronic)    Last labs in February looked great.  LDL 61 on 40 mg atorvastatin plus Zetia. For now as long as she is tolerating, would not change.  Absolute goal for LDL is less than 50.      Relevant Medications   atorvastatin (LIPITOR) 40 MG tablet   ezetimibe (ZETIA) 10 MG tablet   furosemide (LASIX) 80 MG tablet   isosorbide mononitrate (IMDUR) 60 MG 24 hr tablet   nebivolol (BYSTOLIC) 10 MG tablet   Morbid obesity (HCC) (Chronic)   Preoperative cardiovascular examination - Primary    Overall she is pretty stable -- NO ACTIVE ischemic CAD or CHF Sx. She does have evidence of reduced EF by prior echo.  I would like to recheck an echocardiogram to get a new baseline. Otherwise based upon the data below - would proceed to the OR without any additional evaluation as it would not change management recommendations. Echo will provide EF & LV filling pressure data. Ischemic evaluation would not be necessary.  LOW-INTERMEDIATE RISK (Based upon presence of DM-2 on Insulin, & underlying CAD/Mild Cardiomyopathy).  PREOPERATIVE CARDIAC RISK ASSESSMENT   Revised Cardiac Risk  Index:  High Risk Surgery: no; ~ Intermediate  Defined as Intraperitoneal, intrathoracic or suprainguinal vascular  Active CAD: no; controlled with Bystolic & Imdur, revascularized.  CHF: yes, but controlled; probably more DHF - but will reassess Echo Pre-op   Cerebrovascular Disease: no; no CVA or TIA history  Diabetes: yes; On Insulin: yes  CKD (Cr >~ 2): no;   Total: 1 Estimated Risk of Adverse Outcome: Class II Risk -  LOW-INTERMEDIATE Estimated Risk of MI, PE, VF/VT (Cardiac Arrest), Complete Heart Block: 2-6 % - risk reduced by 50% with long-standing Beta Blocker.   ACC/AHA Guidelines  for "Clearance":  Step 1 - Need for Emergency Surgery: No:  If Yes - go straight to OR with perioperative surveillance  Step 2 - Active Cardiac Conditions (Unstable Angina, Decompensated HF, Significant  Arrhytmias - Complete HB, Mobitz II, Symptomatic VT or SVT, Severe Aortic Stenosis - mean gradient > 40 mmHg, Valve area < 1.0 cm2):   Yes  If Yes - Evaluate & Treat per ACC/AHA Guidelines  Step 3 -  Low Risk Surgery: No: Intermediate  If Yes --> proceed to OR  If No --> Step 4  Step 4 - Functional Capacity >= 4 METS without symptoms: Yes  If Yes --> proceed to OR  If No --> Step 5  Step 5 --  Clinical Risk Factors (CRF)   3 or more: No: NO ACTIVE CHF OR CAD Sx  If Yes -- assess Surgical Risk, --   (High Risk Non-cardiac), Intraabdominal or thoracic vascular surgery consider testing if it will change management.  Intermediate Risk: Proceed to OR with HR control, or consider testing if it will change management  1-2 or more CRFs: Yes  If Yes -- assess Surgical Risk, --   (High Risk Non-cardiac), Intraabdominal or thoracic vascular surgery --> Proceed to OR, or consider testing if it will change management.  Intermediate Risk: Proceed to OR with HR control, or consider testing if it will change management      Relevant Orders   EKG 12-Lead   ECHOCARDIOGRAM COMPLETE    Severe obesity (BMI >= 40) (HCC) (Chronic)    She has now lost back the weight that she gained over the ball and additionally has lost 9 pounds since what she weighed last year.  Probably has for 5 more pounds to lose prior to being cleared for hip surgery. Doing a good job with diet and exercise.  Once her hip is fixed, she hopes to do more walking as she does note that her exertional dyspnea has improved dramatically.      Systolic murmur    I did not hear a murmur last time I saw her, or it was not mentioned.  I would like to ensure that she has not had any progression of valvular disease would also like to get the assessment of her EF. Plan: Check 2D echo.      Relevant Orders   ECHOCARDIOGRAM COMPLETE      Current medicines are reviewed at length with the patient today. (+/- concerns) none The following changes have been made:See below  Patient Instructions  Medication Instructions:  Your physician recommends that you continue on your current medications as directed. Please refer to the Current Medication list given to you today.  Testing/Procedures: Your physician has requested that you have an echocardiogram. Echocardiography is a painless test that uses sound waves to create images of your heart. It provides your doctor with information about the size and shape of your heart and how well your heart's chambers and valves are working. This procedure takes approximately one hour. There are no restrictions for this procedure.  This will be done at our [facility] location:  [place] wants you to follow-up in: 6 months with Dr. Ellyn Hack. You will receive a reminder letter in the mail two months in advance. If you don't receive a letter, please call our office to schedule the follow-up appointment.  Any Other Special Instructions Will Be Listed Below (If Applicable).  Once surgery is scheduled, please have surgeon send  clearance  request to office to address Ok to hold Eliquis 2-3 priors to surgery-restart per surgeon   If you need a refill on your cardiac medications before your next appointment, please call your pharmacy.     Studies Ordered:   Orders Placed This Encounter  Procedures  . EKG 12-Lead  . ECHOCARDIOGRAM COMPLETE      Glenetta Hew, M.D., M.S. Interventional Cardiologist   Pager # [phone] Phone # [phone] [address] [place],  [address]

## 2017-12-12 ENCOUNTER — Encounter: Payer: Self-pay | Admitting: Cardiology

## 2017-12-12 NOTE — Assessment & Plan Note (Signed)
Distant history of CAD status post PCI 2005 back in [place].    Cardiac cath in February 2013 showed moderate RCA disease with mild to moderate in-stent restenosis in the RCA. Negative Myoview in 2016, and no recurrent anginal symptoms.  She is not on aspirin or Plavix because of being on Eliquis.  She is on statin And stable dose of Bystolic plus Imdur. On ARB.

## 2017-12-12 NOTE — Assessment & Plan Note (Signed)
Last labs in February looked great.  LDL 61 on 40 mg atorvastatin plus Zetia. For now as long as she is tolerating, would not change.  Absolute goal for LDL is less than 50.

## 2017-12-12 NOTE — Assessment & Plan Note (Signed)
Blood pressure looks good on Bystolic and Avapro.  No longer on HCTZ since she is on standing dose of Lasix.

## 2017-12-12 NOTE — Assessment & Plan Note (Signed)
She has now lost back the weight that she gained over the ball and additionally has lost 9 pounds since what she weighed last year.  Probably has for 5 more pounds to lose prior to being cleared for hip surgery. Doing a good job with diet and exercise.  Once her hip is fixed, she hopes to do more walking as she does note that her exertional dyspnea has improved dramatically.

## 2017-12-12 NOTE — Assessment & Plan Note (Signed)
Pretty much permanent A. fib but asymptomatic.  This may contribute some to exertional dyspnea and she has not had an evaluation of her EF in some time.  I would like to check an echo prior to her surgery just to better understand her EF.   On Bystolic for rate control.  Anticoagulated with Eliquis.  No bleeding concerns. ->  Okay to hold Eliquis 48 hours preop (IT DOES NOT NEED TO BE HELD LONGER)

## 2017-12-12 NOTE — Assessment & Plan Note (Signed)
Controlled with current dose of Lasix.  Not requiring lots of PRN dosing.

## 2017-12-12 NOTE — Assessment & Plan Note (Addendum)
Overall she is pretty stable -- NO ACTIVE ischemic CAD or CHF Sx. She does have evidence of reduced EF by prior echo.  I would like to recheck an echocardiogram to get a new baseline. Otherwise based upon the data below - would proceed to the OR without any additional evaluation as it would not change management recommendations. Echo will provide EF & LV filling pressure data. Ischemic evaluation would not be necessary.  LOW-INTERMEDIATE RISK (Based upon presence of DM-2 on Insulin, & underlying CAD/Mild Cardiomyopathy).  PREOPERATIVE CARDIAC RISK ASSESSMENT   Revised Cardiac Risk Index:  High Risk Surgery: no; ~ Intermediate  Defined as Intraperitoneal, intrathoracic or suprainguinal vascular  Active CAD: no; controlled with Bystolic & Imdur, revascularized.  CHF: yes, but controlled; probably more DHF - but will reassess Echo Pre-op   Cerebrovascular Disease: no; no CVA or TIA history  Diabetes: yes; On Insulin: yes  CKD (Cr >~ 2): no;   Total: 1 Estimated Risk of Adverse Outcome: Class II Risk -  LOW-INTERMEDIATE Estimated Risk of MI, PE, VF/VT (Cardiac Arrest), Complete Heart Block: 2-6 % - risk reduced by 50% with long-standing Beta Blocker.   ACC/AHA Guidelines for "Clearance":  Step 1 - Need for Emergency Surgery: No:  If Yes - go straight to OR with perioperative surveillance  Step 2 - Active Cardiac Conditions (Unstable Angina, Decompensated HF, Significant  Arrhytmias - Complete HB, Mobitz II, Symptomatic VT or SVT, Severe Aortic Stenosis - mean gradient > 40 mmHg, Valve area < 1.0 cm2):   Yes  If Yes - Evaluate & Treat per ACC/AHA Guidelines  Step 3 -  Low Risk Surgery: No: Intermediate  If Yes --> proceed to OR  If No --> Step 4  Step 4 - Functional Capacity >= 4 METS without symptoms: Yes  If Yes --> proceed to OR  If No --> Step 5  Step 5 --  Clinical Risk Factors (CRF)   3 or more: No: NO ACTIVE CHF OR CAD Sx  If Yes -- assess Surgical Risk,  --   (High Risk Non-cardiac), Intraabdominal or thoracic vascular surgery consider testing if it will change management.  Intermediate Risk: Proceed to OR with HR control, or consider testing if it will change management  1-2 or more CRFs: Yes  If Yes -- assess Surgical Risk, --   (High Risk Non-cardiac), Intraabdominal or thoracic vascular surgery --> Proceed to OR, or consider testing if it will change management.  Intermediate Risk: Proceed to OR with HR control, or consider testing if it will change management

## 2017-12-12 NOTE — Assessment & Plan Note (Signed)
Class I related to symptoms with mildly reduced EF on echocardiogram. Plan: Recheck 2D echo to get an updated assessment of her EF. Provider this is okay, would have no reason to not proceed with surgery without any further evaluation. On ARB and beta-blocker with standing dose of Lasix 80 mg twice daily and sliding scale.  Maintaining stable weights even having lost weight.  I instructed her to make sure she adjusts her dry weight down. Potassium supplementation.

## 2017-12-12 NOTE — Assessment & Plan Note (Signed)
I did not hear a murmur last time I saw her, or it was not mentioned.  I would like to ensure that she has not had any progression of valvular disease would also like to get the assessment of her EF. Plan: Check 2D echo.

## 2017-12-16 ENCOUNTER — Other Ambulatory Visit (HOSPITAL_COMMUNITY): Payer: Medicare Other

## 2017-12-17 ENCOUNTER — Ambulatory Visit (HOSPITAL_COMMUNITY): Payer: Medicare Other | Attending: Cardiovascular Disease

## 2017-12-17 ENCOUNTER — Other Ambulatory Visit: Payer: Self-pay

## 2017-12-17 DIAGNOSIS — R609 Edema, unspecified: Secondary | ICD-10-CM | POA: Insufficient documentation

## 2017-12-17 DIAGNOSIS — I4891 Unspecified atrial fibrillation: Secondary | ICD-10-CM | POA: Diagnosis not present

## 2017-12-17 DIAGNOSIS — I517 Cardiomegaly: Secondary | ICD-10-CM | POA: Diagnosis not present

## 2017-12-17 DIAGNOSIS — G4733 Obstructive sleep apnea (adult) (pediatric): Secondary | ICD-10-CM | POA: Insufficient documentation

## 2017-12-17 DIAGNOSIS — Z6841 Body Mass Index (BMI) 40.0 and over, adult: Secondary | ICD-10-CM | POA: Diagnosis not present

## 2017-12-17 DIAGNOSIS — E119 Type 2 diabetes mellitus without complications: Secondary | ICD-10-CM | POA: Diagnosis not present

## 2017-12-17 DIAGNOSIS — Z9012 Acquired absence of left breast and nipple: Secondary | ICD-10-CM | POA: Insufficient documentation

## 2017-12-17 DIAGNOSIS — I420 Dilated cardiomyopathy: Secondary | ICD-10-CM | POA: Insufficient documentation

## 2017-12-17 DIAGNOSIS — Z01810 Encounter for preprocedural cardiovascular examination: Secondary | ICD-10-CM | POA: Insufficient documentation

## 2017-12-17 DIAGNOSIS — R011 Cardiac murmur, unspecified: Secondary | ICD-10-CM | POA: Insufficient documentation

## 2017-12-17 DIAGNOSIS — E785 Hyperlipidemia, unspecified: Secondary | ICD-10-CM | POA: Insufficient documentation

## 2017-12-17 DIAGNOSIS — J45909 Unspecified asthma, uncomplicated: Secondary | ICD-10-CM | POA: Diagnosis not present

## 2017-12-17 DIAGNOSIS — I2510 Atherosclerotic heart disease of native coronary artery without angina pectoris: Secondary | ICD-10-CM | POA: Insufficient documentation

## 2017-12-18 ENCOUNTER — Encounter: Payer: Self-pay | Admitting: Cardiology

## 2017-12-28 ENCOUNTER — Telehealth: Payer: Self-pay | Admitting: Cardiology

## 2017-12-28 NOTE — Telephone Encounter (Signed)
New Message:    Please call, concerning surgery she is going to need.Stephanie Franco

## 2017-12-28 NOTE — Telephone Encounter (Signed)
SPOKE TO HUSBAND,  PATIENT IS READY TO HAVE HIP SURGERY. PATIENT NEEDS TO GET CARDAIC CLEARANCE  HUSBAND  AWARE WILL NEED TO CONFIRM WITH DR HARDING DUE TO RECENT ECHO . APPOINTMENT SCHEDULE AT 01/19/18 AT 10 :20 AM.

## 2018-01-05 ENCOUNTER — Telehealth: Payer: Self-pay | Admitting: Cardiology

## 2018-01-05 NOTE — Telephone Encounter (Signed)
SPOKE TO PATIENT HUSBAND , APPOINTMENT RESCHEDULE FOR TOMORROW - TO DISCUSS PRE OP-AND ECHO

## 2018-01-05 NOTE — Telephone Encounter (Signed)
New message   Patient's husband is returning call.

## 2018-01-06 ENCOUNTER — Encounter: Payer: Self-pay | Admitting: Cardiology

## 2018-01-06 ENCOUNTER — Ambulatory Visit (INDEPENDENT_AMBULATORY_CARE_PROVIDER_SITE_OTHER): Payer: Medicare Other | Admitting: Cardiology

## 2018-01-06 VITALS — BP 154/87 | HR 61 | Ht 63.0 in | Wt 224.6 lb

## 2018-01-06 DIAGNOSIS — R0609 Other forms of dyspnea: Secondary | ICD-10-CM

## 2018-01-06 DIAGNOSIS — R600 Localized edema: Secondary | ICD-10-CM

## 2018-01-06 DIAGNOSIS — R011 Cardiac murmur, unspecified: Secondary | ICD-10-CM

## 2018-01-06 DIAGNOSIS — I2510 Atherosclerotic heart disease of native coronary artery without angina pectoris: Secondary | ICD-10-CM | POA: Diagnosis not present

## 2018-01-06 DIAGNOSIS — E785 Hyperlipidemia, unspecified: Secondary | ICD-10-CM

## 2018-01-06 DIAGNOSIS — I482 Chronic atrial fibrillation: Secondary | ICD-10-CM

## 2018-01-06 DIAGNOSIS — G4733 Obstructive sleep apnea (adult) (pediatric): Secondary | ICD-10-CM

## 2018-01-06 DIAGNOSIS — Z01810 Encounter for preprocedural cardiovascular examination: Secondary | ICD-10-CM

## 2018-01-06 DIAGNOSIS — I4821 Permanent atrial fibrillation: Secondary | ICD-10-CM

## 2018-01-06 DIAGNOSIS — I5042 Chronic combined systolic (congestive) and diastolic (congestive) heart failure: Secondary | ICD-10-CM | POA: Diagnosis not present

## 2018-01-06 DIAGNOSIS — I10 Essential (primary) hypertension: Secondary | ICD-10-CM

## 2018-01-06 DIAGNOSIS — I272 Pulmonary hypertension, unspecified: Secondary | ICD-10-CM

## 2018-01-06 DIAGNOSIS — Z9989 Dependence on other enabling machines and devices: Secondary | ICD-10-CM

## 2018-01-06 DIAGNOSIS — Z9861 Coronary angioplasty status: Secondary | ICD-10-CM

## 2018-01-06 NOTE — Patient Instructions (Addendum)
NO MEDICATION CHANGES AT PRESENT TIME      YOU DO HAVE CLEARANCE TO HAVE HIP SURGERY WITH DR Lyla Glassing HOLD ELIQUIS 5 MG  48 HOURS PRIOR TO SURGERY ,OKAY TO STOP ASPIRIN, RESTART   1 DAY AFTER SURGERY IF OKAY WITH DR Lyla Glassing.    SCHEDULE AT [place] FEB 2020 Your physician has requested that you have an echocardiogram. Echocardiography is a painless test that uses sound waves to create images of your heart. It provides your doctor with information about the size and shape of your heart and how well your heart's chambers and valves are working. This procedure takes approximately one hour. There are no restrictions for this procedure.    Your physician wants you to follow-up in FEB 2020 WITH DR [facility] AFTER ECHO IS COMPLETED.You will receive a reminder letter in the mail two months in advance. If you don't receive a letter, please call our office to schedule the follow-up appointment.   If you need a refill on your cardiac medications before your next appointment, please call your pharmacy.

## 2018-01-06 NOTE — Progress Notes (Signed)
PCP: Lin Landsman, MD  Clinic Note: Chief Complaint  Patient presents with  . Follow-up    After Echo  . Pre-op Exam  . Cardiomyopathy    with Pulmonary HTN on Echo    HPI: Stephanie Franco is a 67 y.o. female with a PMH below who presents today for f/u of Echo as part of pre-op evaluation for hip surgery -- Seen last month for CAD-PCI back in 2006 with patent stent in 2013, and known pulmonary hypertension with mild cardiomyopathy..    She has had any history of Cardiomyopathy (EF roughly 45%, but was previously down to 25 -- presumably combination of both ischemic and nonischemic ) & PAF.  Last stress test was in 2016 when she had onset of A. fib.  No evidence of ischemia or infarction.  Echo September 2016:  EF was 40-45% with moderate pulmonary hypertension.  Wall motion abnormality not explained by her known anatomy.  Stephanie Franco was last seen in July for preop assessment for hip surgery.  We ordered an echo just to get a better assessment of her EF and pulmonary pressures.  At that time she actually is feeling much better she has lost quite a bit of weight hoping to finally goes to move her hip surgery.  She is actually not really noticing any significant shortness of breath compared to last year.  No chest pain or pressure.  Recent Hospitalizations:   None  Studies Personally Reviewed - (if available, images/films reviewed: From Epic Chart or Care Everywhere)  2D echo -December 17, 2017: Improved LVEF to 45 to 50%.  Pulmonary hypertension estimated PA pressures of 90 mmHg. Severe LA dilation& mild /rv dilation.  Interval History: Stephanie Franco presents today still doing quite well.  She is very excited about having reached her target weight for her planned surgery.  She is waiting to have a hip surgery done now.  She is under considerable amount of pain excess may be why her blood pressure was high today.  It is been quite well controlled before.  Really at this point any  exercise is limited by her hip and not by dyspnea.  She is still doing great with weight loss with both diet and exercise.  Her blood pressures have usually been much better than they are today, indicating that her hip was hurting her today.  She denies any PND or orthopnea.  She is consistently using her CPAP and has minimal edema. No sense of any rapid irregular heartbeat/palpitations to suggest arrhythmia.  She is not having any resting or exertional chest pain or pressure.  No syncope/near syncope or TIA/amaurosis fugax. No claudication.  No melena, hematochezia, hematuria or epistaxis.   ROS: A comprehensive was performed. Review of Systems  Constitutional: Positive for weight loss (Still able to lose weight despite hip pain.). Negative for malaise/fatigue.  HENT: Negative for congestion.   Respiratory: Negative for cough and wheezing.        No recent colds  Cardiovascular: Negative for claudication.  Gastrointestinal: Negative for abdominal pain and heartburn.  Genitourinary: Negative for frequency.  Musculoskeletal: Positive for joint pain (Hip & knees). Negative for falls.  Neurological: Positive for dizziness (occasionally with standing up too fast). Negative for weakness.  Psychiatric/Behavioral: The patient does not have insomnia.   All other systems reviewed and are negative.  I have reviewed and (if needed) personally updated the patient's problem list, medications, allergies, past medical and surgical history, social and family history.   Past  Medical History:  Diagnosis Date  . Asthma   . Atrial fibrillation, permanent ([place])    Rate control with Bystolic. CHA2DS2Vasc = 6 (HTN, DM, CHF, Age 89, Female) -> on Pradaxa  . Breast cancer ([place]) 2008   S/P mastectomy  . CAD S/P percutaneous coronary angioplasty 2006;    PCI of circumflex with Taxus DES;; relook-cath Feb 2013: 50-60% short lesion in RCA, 40% ISR Circumflex stent.  . CKD (chronic kidney disease), stage III  ([place])   . Diabetes mellitus, uncontrolled 07/14/2011  . Dilated cardiomyopathy ([place])    Mostly Resolved (EF up from ~25% to ~45% by Echo)  . Edema of both legs    Usually mild, chronic. Controlled with when necessary furosemide and diet  . 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 Gout   . Hyperlipidemia   . Hypertension   . Morbid obesity (HCC)    BMI 41  . OSA on CPAP    On CPAP  . Pulmonary hypertension, unspecified ([place])    PAP ~90 mmHg on Echo 12/2017 -- has OSA on CPAP & obesity - but Overall Improved Sx of dyspnea / edema    Past Surgical History:  Procedure Laterality Date  . CORONARY ANGIOPLASTY WITH STENT PLACEMENT  2006   stent to circumflex  . LEFT HEART CATHETERIZATION WITH CORONARY ANGIOGRAM N/A 07/13/2011   Procedure: LEFT HEART CATHETERIZATION WITH CORONARY ANGIOGRAM;  Surgeon: Leonie Man, MD;  Location: [facility] C[facility] LAB;  Service: Cardiovascular;  normal EF; 61mm 50-60% lesion in RCA; patent stent in circumflex form 2006 with ~40% in-stent re-stenosis  . MASTECTOMY  2008   left  . NM MYOVIEW LTD  11/22/2014   Low Risk. No ischemia or infarction. Not gated 2/2  A. fib - unable to assess EF  . TRANSTHORACIC ECHOCARDIOGRAM  01/2015   LV EF 40-45%, Mild Anteroseptal HK. Mod-Severe  RA dilation with elevated PA Pressures (~~peak 63 mmHg).. Mild LA dilation  . TRANSTHORACIC ECHOCARDIOGRAM  June 2015   In setting of Urosepsis: EF ~25 %, global HK (poor quality study)  . TRANSTHORACIC ECHOCARDIOGRAM  12/2017   Improved LVEF to 45 to 50%.  Pulmonary hypertension estimated PA pressures of 90 mmHg. Severe LA dilation& mild /rv dilation.     Current Meds  Medication Sig  . albuterol (PROVENTIL HFA;VENTOLIN HFA) 108 (90 Base) MCG/ACT inhaler Inhale 1-2 puffs into the lungs every 6 (six) hours as needed for wheezing or shortness of breath.  Marland Kitchen albuterol (PROVENTIL) (2.5 MG/3ML) 0.083% nebulizer solution Take 3 mLs (2.5 mg total) by nebulization every 6 (six) hours as needed  for wheezing or shortness of breath.  . allopurinol (ZYLOPRIM) 300 MG tablet Take 300 mg by mouth daily with supper.   Marland Kitchen atorvastatin (LIPITOR) 40 MG tablet Take 1 tablet (40 mg total) by mouth daily. (Patient taking differently: Take 40 mg by mouth daily at 2 PM. In the afternoon.)  . colchicine 0.6 MG tablet Take 0.6 mg by mouth every evening.   Marland Kitchen ELIQUIS 5 MG TABS tablet Take 1 tablet (5 mg total) by mouth 2 (two) times daily.  Marland Kitchen ezetimibe (ZETIA) 10 MG tablet Take 1 tablet (10 mg total) by mouth daily. (Patient taking differently: Take 10 mg by mouth daily at 2 PM. )  . ferrous sulfate 325 (65 FE) MG EC tablet Take 325 mg by mouth daily with breakfast.  . fexofenadine (ALLEGRA) 180 MG tablet Take 180 mg by mouth daily as needed for allergies.   Marland Kitchen  fluticasone (FLONASE) 50 MCG/ACT nasal spray Place 2 sprays into the nose daily as needed for allergies or rhinitis.   . furosemide (LASIX) 80 MG tablet Take 1 tablet (80 mg total) by mouth 2 (two) times daily. (Patient taking differently: Take 80 mg by mouth 2 (two) times daily. In the morning & in the afternoon.)  . glucose blood (ACCU-CHEK SMARTVIEW) test strip 1 each by Other route as needed for other. Use as instructed  . Insulin Glargine (TOUJEO SOLOSTAR) 300 UNIT/ML SOPN Inject 28 Units into the skin daily with supper.   . irbesartan (AVAPRO) 300 MG tablet Take 1 tablet (300 mg total) by mouth daily. (Patient taking differently: Take 300 mg by mouth daily at 2 PM. )  . isosorbide mononitrate (IMDUR) 60 MG 24 hr tablet Take 1 tablet (60 mg total) by mouth daily.  Marland Kitchen lidocaine (LIDODERM) 5 % Place 1 patch onto the skin daily as needed (for hip pain).   . LORazepam (ATIVAN) 1 MG tablet Take 1 mg by mouth 2 (two) times daily. In the evening with supper & at bedtime  . Multiple Vitamins-Minerals (MULTIVITAMIN WITH MINERALS) tablet Take 1 tablet by mouth daily.  . nebivolol (BYSTOLIC) 10 MG tablet Take 1 tablet (10 mg total) by mouth daily.  .  nitroGLYCERIN (NITROSTAT) 0.4 MG SL tablet DISSOLVE 1 TABLET (0.4 MG TOTAL) UNDER THE TONGUE EVERY 5 (FIVE) MINUTES AS NEEDED FOR CHEST PAIN.  Marland Kitchen pantoprazole (PROTONIX) 40 MG tablet Take 1 tablet (40 mg total) by mouth daily.  . potassium chloride SA (KLOR-CON M20) 20 MEQ tablet Take 1 tablet (20 mEq total) by mouth 2 (two) times daily. (Patient taking differently: Take 20 mEq by mouth 2 (two) times daily. Morning & afternoon)  . [DISCONTINUED] diclofenac (VOLTAREN) 75 MG EC tablet Take 1 tablet by mouth 2 (two) times daily.    No Known Allergies  Social History   Tobacco Use  . Smoking status: Former Smoker    Packs/day: 0.50    Years: 6.00    Pack years: 3.00    Types: Cigarettes    Last attempt to quit: 05/18/1992    Years since quitting: 25.6  . Smokeless tobacco: Never Used  Substance Use Topics  . Alcohol use: No    Comment: 07/13/11 "have drank occasionally; not now"  . Drug use: No   Social History   Social History Narrative   She is a married mother of 64, grandmother 2. Usually accompanied by her husband. She does not work. She had been working on her exercise, but is no longer as active. Does not drink and does not smoke    family history includes Atrial fibrillation in her son; Coronary artery disease in her mother; Hypertension in her mother.  Wt Readings from Last 3 Encounters:  01/06/18 224 lb 9.6 oz (101.9 kg)  12/10/17 229 lb 3.2 oz (104 kg)  07/13/17 246 lb 3.2 oz (111.7 kg)    PHYSICAL EXAM BP (!) 154/87   Pulse 61   Ht 5\' 3"  (1.6 m)   Wt 224 lb 9.6 oz (101.9 kg)   BMI 39.79 kg/m  Physical Exam  Constitutional: She is oriented to person, place, and time. She appears well-developed and well-nourished.  Well groomed.    HENT:  Head: Normocephalic and atraumatic.  Mouth/Throat: Oropharynx is clear and moist. No oropharyngeal exudate.  Neck: Neck supple. Hepatojugular reflux and JVD (mlidly increased - ~1- cm H20) present. Carotid bruit is not present.    Cardiovascular: Normal  rate, S1 normal, S2 normal and intact distal pulses. An irregular rhythm present.  No extrasystoles are present. PMI is not displaced (Unable to palpate). Exam reveals distant heart sounds. Exam reveals no gallop and no friction rub.  Murmur heard.  Harsh early systolic murmur is present with a grade of 1/6 at the upper right sternal border, upper left sternal border and lower left sternal border. Cannot tell (distant heart sounds) if the murmur is SE butM or HSM. Pulmonary/Chest: Effort normal and breath sounds normal. No respiratory distress. She has no wheezes. She has no rales. She exhibits no tenderness.  Abdominal: Soft. Bowel sounds are normal. She exhibits no distension. There is no tenderness. There is no rebound and no guarding.  Musculoskeletal: Normal range of motion. She exhibits edema (~1 + BLE).  Neurological: She is alert and oriented to person, place, and time.  Skin: Skin is warm and dry. No rash noted. No erythema.  Psychiatric: She has a normal mood and affect. Her behavior is normal. Judgment and thought content normal.  Nursing note and vitals reviewed.   Adult ECG Report n/a  Other studies Reviewed: Additional studies/ records that were reviewed today include:  Recent Labs:  Lab Results  Component Value Date   CHOL 117 05/19/2017   HDL 36 (L) 05/19/2017   LDLCALC 61 05/19/2017   TRIG 113 05/19/2017   CHOLHDL 3.3 05/19/2017   Lab Results  Component Value Date   CREATININE 1.36 (H) 07/06/2017   BUN 28 (H) 07/06/2017   NA 144 07/06/2017   K 3.8 07/06/2017   CL 105 07/06/2017   CO2 24 07/06/2017    ASSESSMENT / PLAN: Problem List Items Addressed This Visit    Atrial fibrillation, permanent ([place]); CHA2DS2Vasc - 6 (Chronic)    Permanent atrial fibrillation, mostly asymptomatic with exception of having some exertional dyspnea.  Relatively stable EF, although she probably has some diastolic dysfunction leading to elevated probably  pressures with likely pulmonary venous congestion. Rate is well controlled on current dose of Bystolic.  On Eliquis for anticoagulation.  -Okay to hold Eliquis 2 days preop and restart postop      Relevant Orders   ECHOCARDIOGRAM COMPLETE   CAD S/P percutaneous coronary angioplasty - Primary (Chronic)    Distant history of CAD with PCI many years ago.  No recurrent anginal symptoms. Most recent evaluation with cath was in 2013 showing moderate in-stent restenosis, this was followed by a negative Myoview in 2016.  Since she has not had any recurrent anginal symptoms I do not see any reason to recheck for any evidence of ischemia.  Plan: Continue Bystolic, Imdur, ARB and statin.  Not on aspirin/Plavix because of Eliquis.      Relevant Orders   ECHOCARDIOGRAM COMPLETE   Chronic combined systolic and diastolic heart failure, NYHA class 2 (HCC) (Chronic)    Class I-II CHF symptoms.  Probably has more diastolic dysfunction related to A. fib contributing to pulmonary hypertension. EF actually seems to be improved with worsening pulmonary pressures on most recent echo.  Given her lack of dyspnea symptoms, I would probably not change any management at this point besides monitoring her blood pressure little occasionally. She is still on 80 twice daily of Lasix along with standing dose of irbesartan and Bystolic. -Well-controlled edema and no worsening exertional dyspnea would not further evaluate this point preoperatively.      Relevant Orders   ECHOCARDIOGRAM COMPLETE   DOE (dyspnea on exertion) (Chronic)   Relevant Orders  ECHOCARDIOGRAM COMPLETE   Edema of both legs (Chronic)    Well-controlled on current dose of Lasix.      Essential hypertension (Chronic)    Blood pressure is little bit elevated today because of her hip pain.  Need to closely monitor.  Is on high-dose Avapro Bystolic.  Also on high-dose Lasix.  For now, unless her pressures increase on routine basis with titrate.       Hyperlipidemia with target LDL less than 70 (Chronic)    Well-controlled lipids on current dose of atorvastatin plus Zetia.      Morbid obesity ([place]) (Chronic)   Relevant Orders   ECHOCARDIOGRAM COMPLETE   OSA on CPAP (Chronic)    CPAP have been off for a while.  I suspect that the department of his pressures on echocardiogram.  Echo does tend to overestimate pulmonary pressures and therefore with the absence of any worsening symptoms would not change this time. Continue CPAP.      Preoperative cardiovascular examination    Please see most recent evaluation.  Hip surgery is most likely a moderate risk surgery and with her cardiac risk factors she would be a risk patient.  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  In the absence of any active angina or worsening dyspnea I do not think need to do any further cardiac evaluation.  We will simply monitor for signs of volume overload.  Avoid excess volume perioperatively. Okay to hold Eliquis 48 hours preop and restart postop day. Continue beta-blocker as this reduces her overall risk, especially with A. fib.      Relevant Orders   ECHOCARDIOGRAM COMPLETE   Pulmonary HTN (HCC) (Chronic)    More elevated PA pressures on recent echocardiogram probably related to her CPAP and obesity, however in the absence of worsening symptoms, will need additional evaluation prior to her hip surgery.  Hopefully with better control blood pressure, continued weight loss, this will improve.   Check 2D echo February.  If still elevated, or symptoms worsen, could consider right heart catheterization.      Relevant Orders   ECHOCARDIOGRAM COMPLETE   Systolic murmur    No real significant source of murmur seen on echocardiogram.         Current medicines are reviewed at length with the patient today. (+/- concerns) none The following changes have been made:See  below  Patient Instructions  NO MEDICATION CHANGES AT PRESENT TIME      YOU DO HAVE CLEARANCE TO HAVE HIP SURGERY WITH DR Lyla Glassing HOLD ELIQUIS 5 MG  48 HOURS PRIOR TO SURGERY ,OKAY TO STOP ASPIRIN, RESTART   1 DAY AFTER SURGERY IF OKAY WITH DR Lyla Glassing.    SCHEDULE AT [place] FEB 2020 Your physician has requested that you have an echocardiogram. Echocardiography is a painless test that uses sound waves to create images of your heart. It provides your doctor with information about the size and shape of your heart and how well your heart's chambers and valves are working. This procedure takes approximately one hour. There are no restrictions for this procedure.    Your physician wants you to follow-up in FEB 2020 WITH DR [facility] AFTER ECHO IS COMPLETED.You will receive a reminder letter in the mail two months in advance. If you don't receive a letter, please call our office to schedule the follow-up appointment.   If you need a refill on your cardiac  medications before your next appointment, please call your pharmacy.   Studies Ordered:   Orders Placed This Encounter  Procedures  . ECHOCARDIOGRAM COMPLETE      Glenetta Hew, M.D., M.S. Interventional Cardiologist   Pager # [phone] Phone # [phone] [address] [place], [place] [address]

## 2018-01-09 ENCOUNTER — Encounter: Payer: Self-pay | Admitting: Cardiology

## 2018-01-09 NOTE — Assessment & Plan Note (Signed)
Blood pressure is little bit elevated today because of her hip pain.  Need to closely monitor.  Is on high-dose Avapro Bystolic.  Also on high-dose Lasix.  For now, unless her pressures increase on routine basis with titrate.

## 2018-01-09 NOTE — Assessment & Plan Note (Signed)
CPAP have been off for a while.  I suspect that the department of his pressures on echocardiogram.  Echo does tend to overestimate pulmonary pressures and therefore with the absence of any worsening symptoms would not change this time. Continue CPAP.

## 2018-01-09 NOTE — Assessment & Plan Note (Signed)
Class I-II CHF symptoms.  Probably has more diastolic dysfunction related to A. fib contributing to pulmonary hypertension. EF actually seems to be improved with worsening pulmonary pressures on most recent echo.  Given her lack of dyspnea symptoms, I would probably not change any management at this point besides monitoring her blood pressure little occasionally. She is still on 80 twice daily of Lasix along with standing dose of irbesartan and Bystolic. -Well-controlled edema and no worsening exertional dyspnea would not further evaluate this point preoperatively.

## 2018-01-09 NOTE — Assessment & Plan Note (Signed)
Please see most recent evaluation.  Hip surgery is most likely a moderate risk surgery and with her cardiac risk factors she would be a risk patient.  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  In the absence of any active angina or worsening dyspnea I do not think need to do any further cardiac evaluation.  We will simply monitor for signs of volume overload.  Avoid excess volume perioperatively. Okay to hold Eliquis 48 hours preop and restart postop day. Continue beta-blocker as this reduces her overall risk, especially with A. fib.

## 2018-01-09 NOTE — Assessment & Plan Note (Signed)
Well-controlled on current dose of Lasix.

## 2018-01-09 NOTE — Assessment & Plan Note (Signed)
No real significant source of murmur seen on echocardiogram.

## 2018-01-09 NOTE — Assessment & Plan Note (Addendum)
More elevated PA pressures on recent echocardiogram probably related to her CPAP and obesity, however in the absence of worsening symptoms, will need additional evaluation prior to her hip surgery.  Hopefully with better control blood pressure, continued weight loss, this will improve.   Check 2D echo February.  If still elevated, or symptoms worsen, could consider right heart catheterization.

## 2018-01-09 NOTE — Assessment & Plan Note (Signed)
Well-controlled lipids on current dose of atorvastatin plus Zetia.

## 2018-01-09 NOTE — Assessment & Plan Note (Signed)
Distant history of CAD with PCI many years ago.  No recurrent anginal symptoms. Most recent evaluation with cath was in 2013 showing moderate in-stent restenosis, this was followed by a negative Myoview in 2016.  Since she has not had any recurrent anginal symptoms I do not see any reason to recheck for any evidence of ischemia.  Plan: Continue Bystolic, Imdur, ARB and statin.  Not on aspirin/Plavix because of Eliquis.

## 2018-01-09 NOTE — Assessment & Plan Note (Signed)
Permanent atrial fibrillation, mostly asymptomatic with exception of having some exertional dyspnea.  Relatively stable EF, although she probably has some diastolic dysfunction leading to elevated probably pressures with likely pulmonary venous congestion. Rate is well controlled on current dose of Bystolic.  On Eliquis for anticoagulation.  -Okay to hold Eliquis 2 days preop and restart postop

## 2018-01-10 ENCOUNTER — Encounter (HOSPITAL_COMMUNITY): Payer: Self-pay

## 2018-01-10 ENCOUNTER — Ambulatory Visit: Payer: Self-pay | Admitting: Orthopedic Surgery

## 2018-01-10 NOTE — H&P (Signed)
TOTAL HIP ADMISSION H&P  Patient is admitted for right total hip arthroplasty.  Subjective:  Chief Complaint: right hip pain  HPI: Stephanie Franco, 67 y.o. female, has a history of pain and functional disability in the right hip(s) due to arthritis and patient has failed non-surgical conservative treatments for greater than 12 weeks to include NSAID's and/or analgesics, corticosteriod injections, flexibility and strengthening excercises, use of assistive devices, weight reduction as appropriate and activity modification.  Onset of symptoms was gradual starting 4 years ago with gradually worsening course since that time.The patient noted no past surgery on the right hip(s).  Patient currently rates pain in the right hip at 10 out of 10 with activity. Patient has night pain, worsening of pain with activity and weight bearing, trendelenberg gait, pain that interfers with activities of daily living and pain with passive range of motion. Patient has evidence of subchondral sclerosis, periarticular osteophytes and joint space narrowing by imaging studies. This condition presents safety issues increasing the risk of falls.  There is no current active infection.  Patient Active Problem List   Diagnosis Date Noted  . Pulmonary HTN ([place]) 01/06/2018  . Systolic murmur 30/16/0109  . Preoperative cardiovascular examination 12/10/2017  . Abnormal serum creatinine level 05/31/2017  . Medication management 11/16/2016  . 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 Chronic combined systolic and diastolic heart failure, NYHA class 2 ([place])   . Hypokalemia 10/31/2014  . Pyelonephritis 10/31/2014  . Abnormal liver function 10/31/2014  . Acute renal failure superimposed on stage 3 chronic kidney disease ([place])   . Fever 06/29/2013  . Severe obesity (BMI >= 40) ([place]) 06/24/2013  . Leg cramping 06/24/2013  . DOE (dyspnea on exertion) 12/20/2012    Class: Diagnosis of  . CAD S/P percutaneous coronary  angioplasty   . Atrial fibrillation, permanent ([place]); CHA2DS2Vasc - 6   . Edema of both legs   . Diabetes mellitus ([place]) 07/14/2011  . Morbid obesity ([place]) 07/12/2011  . Uterine fibroid, (bleeding issues when she was put on Pradaxa for AF) 07/12/2011  . Hyperlipidemia with target LDL less than 70 07/12/2011  . OSA on CPAP 07/12/2011  . Essential hypertension    Past Medical History:  Diagnosis Date  . Aortic atherosclerosis ([place])   . Asthma   . Atrial fibrillation, permanent ([place])    Rate control with Bystolic. CHA2DS2Vasc = 6 (HTN, DM, CHF, Age 8, Female) -> on Pradaxa  . Breast cancer ([place]) 2008   S/P mastectomy  . CAD S/P percutaneous coronary angioplasty 2006;    PCI of circumflex with Taxus DES;; relook-cath Feb 2013: 50-60% short lesion in RCA, 40% ISR Circumflex stent.  . Cardiomegaly   . CKD (chronic kidney disease), stage III ([place])   . Diabetes mellitus, uncontrolled 07/14/2011  . Dilated cardiomyopathy ([place])    Mostly Resolved (EF up from ~25% to ~45% by Echo)  . DOE (dyspnea on exertion)   . Edema of both legs    Usually mild, chronic. Controlled with when necessary furosemide and diet  . 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 Gout   . Hyperlipidemia   . Hypertension   . Morbid obesity (H[facility])    BMI 41  . OSA on CPAP    On CPAP  . Pulmonary hypertension, unspecified ([place])    PAP ~90 mmHg on Echo 12/2017 -- has OSA on CPAP & obesity - but Overall Improved Sx of dyspnea / edema  . Systolic murmur  Past Surgical History:  Procedure Laterality Date  . CORONARY ANGIOPLASTY WITH STENT PLACEMENT  2006   stent to circumflex  . LEFT HEART CATHETERIZATION WITH CORONARY ANGIOGRAM N/A 07/13/2011   Procedure: LEFT HEART CATHETERIZATION WITH CORONARY ANGIOGRAM;  Surgeon: Leonie Man, MD;  Location[facility] CATH LAB;  Service: Cardiovascular;  normal EF; 90mm 50-60% lesion in RCA; patent stent in circumflex form 2006 with ~40% in-stent re-stenosis  . MASTECTOMY  2008    left  . NM MYOVIEW LTD  11/22/2014   Low Risk. No ischemia or infarction. Not gated 2/2  A. fib - unable to assess EF  . TRANSTHORACIC ECHOCARDIOGRAM  01/2015   LV EF 40-45%, Mild Anteroseptal HK. Mod-Severe  RA dilation with elevated PA Pressures (~~peak 63 mmHg).. Mild LA dilation  . TRANSTHORACIC ECHOCARDIOGRAM  June 2015   In setting of Urosepsis: EF ~25 %, global HK (poor quality study)  . TRANSTHORACIC ECHOCARDIOGRAM  12/2017   Improved LVEF to 45 to 50%.  Pulmonary hypertension estimated PA pressures of 90 mmHg. Severe LA dilation& mild /rv dilation.    Current Outpatient Medications  Medication Sig Dispense Refill Last Dose  . albuterol (PROVENTIL HFA;VENTOLIN HFA) 108 (90 Base) MCG/ACT inhaler Inhale 1-2 puffs into the lungs every 6 (six) hours as needed for wheezing or shortness of breath. 1 Inhaler 0 Taking  . albuterol (PROVENTIL) (2.5 MG/3ML) 0.083% nebulizer solution Take 3 mLs (2.5 mg total) by nebulization every 6 (six) hours as needed for wheezing or shortness of breath. 75 mL 12 Taking  . allopurinol (ZYLOPRIM) 300 MG tablet Take 300 mg by mouth daily with supper.    Taking  . atorvastatin (LIPITOR) 40 MG tablet Take 1 tablet (40 mg total) by mouth daily. (Patient taking differently: Take 40 mg by mouth daily at 2 PM. In the afternoon.) 30 tablet 11 Taking  . colchicine 0.6 MG tablet Take 0.6 mg by mouth every evening.    Taking  . ELIQUIS 5 MG TABS tablet Take 1 tablet (5 mg total) by mouth 2 (two) times daily. 60 tablet 6 Taking  . ezetimibe (ZETIA) 10 MG tablet Take 1 tablet (10 mg total) by mouth daily. (Patient taking differently: Take 10 mg by mouth daily at 2 PM. ) 30 tablet 11 Taking  . ferrous sulfate 325 (65 FE) MG EC tablet Take 325 mg by mouth daily with breakfast.   Taking  . fexofenadine (ALLEGRA) 180 MG tablet Take 180 mg by mouth daily as needed for allergies.   3 Taking  . fluticasone (FLONASE) 50 MCG/ACT nasal spray Place 2 sprays into the nose daily as  needed for allergies or rhinitis.    Taking  . furosemide (LASIX) 80 MG tablet Take 1 tablet (80 mg total) by mouth 2 (two) times daily. (Patient taking differently: Take 80 mg by mouth 2 (two) times daily. In the morning & in the afternoon.) 60 tablet 11 Taking  . glucose blood (ACCU-CHEK SMARTVIEW) test strip 1 each by Other route as needed for other. Use as instructed   Taking  . Insulin Glargine (TOUJEO SOLOSTAR) 300 UNIT/ML SOPN Inject 28 Units into the skin daily with supper.    Taking  . irbesartan (AVAPRO) 300 MG tablet Take 1 tablet (300 mg total) by mouth daily. (Patient taking differently: Take 300 mg by mouth daily at 2 PM. ) 30 tablet 6 Taking  . isosorbide mononitrate (IMDUR) 60 MG 24 hr tablet Take 1 tablet (60 mg total) by mouth daily. 30 tablet  6 Taking  . lidocaine (LIDODERM) 5 % Place 1 patch onto the skin daily as needed (for hip pain).    Taking  . LORazepam (ATIVAN) 1 MG tablet Take 1 mg by mouth 2 (two) times daily. In the evening with supper & at bedtime   Taking  . Multiple Vitamins-Minerals (MULTIVITAMIN WITH MINERALS) tablet Take 1 tablet by mouth daily.   Taking  . nebivolol (BYSTOLIC) 10 MG tablet Take 1 tablet (10 mg total) by mouth daily. 30 tablet 11 Taking  . nitroGLYCERIN (NITROSTAT) 0.4 MG SL tablet DISSOLVE 1 TABLET (0.4 MG TOTAL) UNDER THE TONGUE EVERY 5 (FIVE) MINUTES AS NEEDED FOR CHEST PAIN. 25 tablet 6 Taking  . pantoprazole (PROTONIX) 40 MG tablet Take 1 tablet (40 mg total) by mouth daily. 30 tablet 6 Taking  . potassium chloride SA (KLOR-CON M20) 20 MEQ tablet Take 1 tablet (20 mEq total) by mouth 2 (two) times daily. (Patient taking differently: Take 20 mEq by mouth 2 (two) times daily. Morning & afternoon) 60 tablet 11 Taking   No current facility-administered medications for this visit.    No Known Allergies  Social History   Tobacco Use  . Smoking status: Former Smoker    Packs/day: 0.50    Years: 6.00    Pack years: 3.00    Types: Cigarettes     Last attempt to quit: 05/18/1992    Years since quitting: 25.6  . Smokeless tobacco: Never Used  Substance Use Topics  . Alcohol use: No    Comment: 07/13/11 "have drank occasionally; not now"    Family History  Problem Relation Age of Onset  . Coronary artery disease Mother   . Hypertension Mother   . Atrial fibrillation Son      Review of Systems  Constitutional: Negative.   HENT: Negative.   Eyes: Negative.   Respiratory: Negative.   Cardiovascular: Negative.   Gastrointestinal: Negative.   Genitourinary: Negative.   Musculoskeletal: Positive for joint pain.  Skin: Negative.   Neurological: Negative.   Endo/Heme/Allergies: Negative.   Psychiatric/Behavioral: Negative.     Objective:  Physical Exam  Vitals reviewed. Constitutional: She is oriented to person, place, and time. She appears well-developed and well-nourished.  HENT:  Head: Normocephalic and atraumatic.  Eyes: Pupils are equal, round, and reactive to light. Conjunctivae and EOM are normal.  Neck: Normal range of motion. Neck supple.  Cardiovascular: Normal rate and regular rhythm.  Respiratory: Effort normal. No respiratory distress.  GI: Soft. She exhibits no distension.  Genitourinary:  Genitourinary Comments: deferred  Musculoskeletal:       Right hip: She exhibits decreased range of motion, decreased strength and bony tenderness.  deferred  Neurological: She is alert and oriented to person, place, and time. She has normal reflexes.  Skin: Skin is warm and dry.  Psychiatric: She has a normal mood and affect. Her behavior is normal. Thought content normal.    Vital signs in last 24 hours: @VSRANGES @  Labs:   Estimated body mass index is 39.79 kg/m as calculated from the following:   Height as of 01/06/18: 5\' 3"  (1.6 m).   Weight as of 01/06/18: 101.9 kg.   Imaging Review Plain radiographs demonstrate severe degenerative joint disease of the right hip(s). The bone quality appears to be  adequate for age and reported activity level.    Preoperative templating of the joint replacement has been completed, documented, and submitted to the Operating Room personnel in order to optimize intra-operative equipment management.  Assessment/Plan:  End stage arthritis, right hip(s)  The patient history, physical examination, clinical judgement of the provider and imaging studies are consistent with end stage degenerative joint disease of the right hip(s) and total hip arthroplasty is deemed medically necessary. The treatment options including medical management, injection therapy, arthroscopy and arthroplasty were discussed at length. The risks and benefits of total hip arthroplasty were presented and reviewed. The risks due to aseptic loosening, infection, stiffness, dislocation/subluxation,  thromboembolic complications and other imponderables were discussed.  The patient acknowledged the explanation, agreed to proceed with the plan and consent was signed. Patient is being admitted for inpatient treatment for surgery, pain control, PT, OT, prophylactic antibiotics, VTE prophylaxis, progressive ambulation and ADL's and discharge planning.The patient is planning to be discharged home with HEP. On chronic Eliquis.

## 2018-01-10 NOTE — Pre-Procedure Instructions (Signed)
The following are in epic:  Cardiac Clearance Dr. Ellyn Hack 01/06/18 EKG 12/10/2017 ECHO 12/17/2017 CXR 02/20/2017  Surgical clearance from Dr. Ayesha Rumpf 01/03/2018 and Dr. Ellyn Hack 01/06/18 in patient's hard chart.

## 2018-01-10 NOTE — H&P (View-Only) (Signed)
TOTAL HIP ADMISSION H&P  Patient is admitted for right total hip arthroplasty.  Subjective:  Chief Complaint: right hip pain  HPI: Stephanie Franco, 67 y.o. female, has a history of pain and functional disability in the right hip(s) due to arthritis and patient has failed non-surgical conservative treatments for greater than 12 weeks to include NSAID's and/or analgesics, corticosteriod injections, flexibility and strengthening excercises, use of assistive devices, weight reduction as appropriate and activity modification.  Onset of symptoms was gradual starting 4 years ago with gradually worsening course since that time.The patient noted no past surgery on the right hip(s).  Patient currently rates pain in the right hip at 10 out of 10 with activity. Patient has night pain, worsening of pain with activity and weight bearing, trendelenberg gait, pain that interfers with activities of daily living and pain with passive range of motion. Patient has evidence of subchondral sclerosis, periarticular osteophytes and joint space narrowing by imaging studies. This condition presents safety issues increasing the risk of falls.  There is no current active infection.  Patient Active Problem List   Diagnosis Date Noted  . Pulmonary HTN ([place]) 01/06/2018  . Systolic murmur 67/67/2094  . Preoperative cardiovascular examination 12/10/2017  . Abnormal serum creatinine level 05/31/2017  . Medication management 11/16/2016  . Congestive dilated cardiomyopathy ([place]) 01/29/2015  . Chronic combined systolic and diastolic heart failure, NYHA class 2 ([place])   . Hypokalemia 10/31/2014  . Pyelonephritis 10/31/2014  . Abnormal liver function 10/31/2014  . Acute renal failure superimposed on stage 3 chronic kidney disease ([place])   . Fever 06/29/2013  . Severe obesity (BMI >= 40) ([place]) 06/24/2013  . Leg cramping 06/24/2013  . DOE (dyspnea on exertion) 12/20/2012    Class: Diagnosis of  . CAD S/P percutaneous coronary  angioplasty   . Atrial fibrillation, permanent ([place]); CHA2DS2Vasc - 6   . Edema of both legs   . Diabetes mellitus ([place]) 07/14/2011  . Morbid obesity ([place]) 07/12/2011  . Uterine fibroid, (bleeding issues when she was put on Pradaxa for AF) 07/12/2011  . Hyperlipidemia with target LDL less than 70 07/12/2011  . OSA on CPAP 07/12/2011  . Essential hypertension    Past Medical History:  Diagnosis Date  . Aortic atherosclerosis ([place])   . Asthma   . Atrial fibrillation, permanent ([place])    Rate control with Bystolic. CHA2DS2Vasc = 6 (HTN, DM, CHF, Age 39, Female) -> on Pradaxa  . Breast cancer ([place]) 2008   S/P mastectomy  . CAD S/P percutaneous coronary angioplasty 2006;    PCI of circumflex with Taxus DES;; relook-cath Feb 2013: 50-60% short lesion in RCA, 40% ISR Circumflex stent.  . Cardiomegaly   . CKD (chronic kidney disease), stage III ([place])   . Diabetes mellitus, uncontrolled 07/14/2011  . Dilated cardiomyopathy ([place])    Mostly Resolved (EF up from ~25% to ~45% by Echo)  . DOE (dyspnea on exertion)   . Edema of both legs    Usually mild, chronic. Controlled with when necessary furosemide and diet  . 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 Gout   . Hyperlipidemia   . Hypertension   . Morbid obesity (H[facility])    BMI 41  . OSA on CPAP    On CPAP  . Pulmonary hypertension, unspecified ([place])    PAP ~90 mmHg on Echo 12/2017 -- has OSA on CPAP & obesity - but Overall Improved Sx of dyspnea / edema  . Systolic murmur  Past Surgical History:  Procedure Laterality Date  . CORONARY ANGIOPLASTY WITH STENT PLACEMENT  2006   stent to circumflex  . LEFT HEART CATHETERIZATION WITH CORONARY ANGIOGRAM N/A 07/13/2011   Procedure: LEFT HEART CATHETERIZATION WITH CORONARY ANGIOGRAM;  Surgeon: Leonie Man, MD;  Location: [facility] CATH LAB;  Service: Cardiovascular;  normal EF; 75mm 50-60% lesion in RCA; patent stent in circumflex form 2006 with ~40% in-stent re-stenosis  . MASTECTOMY  2008    left  . NM MYOVIEW LTD  11/22/2014   Low Risk. No ischemia or infarction. Not gated 2/2  A. fib - unable to assess EF  . TRANSTHORACIC ECHOCARDIOGRAM  01/2015   LV EF 40-45%, Mild Anteroseptal HK. Mod-Severe  RA dilation with elevated PA Pressures (~~peak 63 mmHg).. Mild LA dilation  . TRANSTHORACIC ECHOCARDIOGRAM  June 2015   In setting of Urosepsis: EF ~25 %, global HK (poor quality study)  . TRANSTHORACIC ECHOCARDIOGRAM  12/2017   Improved LVEF to 45 to 50%.  Pulmonary hypertension estimated PA pressures of 90 mmHg. Severe LA dilation& mild /rv dilation.    Current Outpatient Medications  Medication Sig Dispense Refill Last Dose  . albuterol (PROVENTIL HFA;VENTOLIN HFA) 108 (90 Base) MCG/ACT inhaler Inhale 1-2 puffs into the lungs every 6 (six) hours as needed for wheezing or shortness of breath. 1 Inhaler 0 Taking  . albuterol (PROVENTIL) (2.5 MG/3ML) 0.083% nebulizer solution Take 3 mLs (2.5 mg total) by nebulization every 6 (six) hours as needed for wheezing or shortness of breath. 75 mL 12 Taking  . allopurinol (ZYLOPRIM) 300 MG tablet Take 300 mg by mouth daily with supper.    Taking  . atorvastatin (LIPITOR) 40 MG tablet Take 1 tablet (40 mg total) by mouth daily. (Patient taking differently: Take 40 mg by mouth daily at 2 PM. In the afternoon.) 30 tablet 11 Taking  . colchicine 0.6 MG tablet Take 0.6 mg by mouth every evening.    Taking  . ELIQUIS 5 MG TABS tablet Take 1 tablet (5 mg total) by mouth 2 (two) times daily. 60 tablet 6 Taking  . ezetimibe (ZETIA) 10 MG tablet Take 1 tablet (10 mg total) by mouth daily. (Patient taking differently: Take 10 mg by mouth daily at 2 PM. ) 30 tablet 11 Taking  . ferrous sulfate 325 (65 FE) MG EC tablet Take 325 mg by mouth daily with breakfast.   Taking  . fexofenadine (ALLEGRA) 180 MG tablet Take 180 mg by mouth daily as needed for allergies.   3 Taking  . fluticasone (FLONASE) 50 MCG/ACT nasal spray Place 2 sprays into the nose daily as  needed for allergies or rhinitis.    Taking  . furosemide (LASIX) 80 MG tablet Take 1 tablet (80 mg total) by mouth 2 (two) times daily. (Patient taking differently: Take 80 mg by mouth 2 (two) times daily. In the morning & in the afternoon.) 60 tablet 11 Taking  . glucose blood (ACCU-CHEK SMARTVIEW) test strip 1 each by Other route as needed for other. Use as instructed   Taking  . Insulin Glargine (TOUJEO SOLOSTAR) 300 UNIT/ML SOPN Inject 28 Units into the skin daily with supper.    Taking  . irbesartan (AVAPRO) 300 MG tablet Take 1 tablet (300 mg total) by mouth daily. (Patient taking differently: Take 300 mg by mouth daily at 2 PM. ) 30 tablet 6 Taking  . isosorbide mononitrate (IMDUR) 60 MG 24 hr tablet Take 1 tablet (60 mg total) by mouth daily. 30 tablet  6 Taking  . lidocaine (LIDODERM) 5 % Place 1 patch onto the skin daily as needed (for hip pain).    Taking  . LORazepam (ATIVAN) 1 MG tablet Take 1 mg by mouth 2 (two) times daily. In the evening with supper & at bedtime   Taking  . Multiple Vitamins-Minerals (MULTIVITAMIN WITH MINERALS) tablet Take 1 tablet by mouth daily.   Taking  . nebivolol (BYSTOLIC) 10 MG tablet Take 1 tablet (10 mg total) by mouth daily. 30 tablet 11 Taking  . nitroGLYCERIN (NITROSTAT) 0.4 MG SL tablet DISSOLVE 1 TABLET (0.4 MG TOTAL) UNDER THE TONGUE EVERY 5 (FIVE) MINUTES AS NEEDED FOR CHEST PAIN. 25 tablet 6 Taking  . pantoprazole (PROTONIX) 40 MG tablet Take 1 tablet (40 mg total) by mouth daily. 30 tablet 6 Taking  . potassium chloride SA (KLOR-CON M20) 20 MEQ tablet Take 1 tablet (20 mEq total) by mouth 2 (two) times daily. (Patient taking differently: Take 20 mEq by mouth 2 (two) times daily. Morning & afternoon) 60 tablet 11 Taking   No current facility-administered medications for this visit.    No Known Allergies  Social History   Tobacco Use  . Smoking status: Former Smoker    Packs/day: 0.50    Years: 6.00    Pack years: 3.00    Types: Cigarettes     Last attempt to quit: 05/18/1992    Years since quitting: 25.6  . Smokeless tobacco: Never Used  Substance Use Topics  . Alcohol use: No    Comment: 07/13/11 "have drank occasionally; not now"    Family History  Problem Relation Age of Onset  . Coronary artery disease Mother   . Hypertension Mother   . Atrial fibrillation Son      Review of Systems  Constitutional: Negative.   HENT: Negative.   Eyes: Negative.   Respiratory: Negative.   Cardiovascular: Negative.   Gastrointestinal: Negative.   Genitourinary: Negative.   Musculoskeletal: Positive for joint pain.  Skin: Negative.   Neurological: Negative.   Endo/Heme/Allergies: Negative.   Psychiatric/Behavioral: Negative.     Objective:  Physical Exam  Vitals reviewed. Constitutional: She is oriented to person, place, and time. She appears well-developed and well-nourished.  HENT:  Head: Normocephalic and atraumatic.  Eyes: Pupils are equal, round, and reactive to light. Conjunctivae and EOM are normal.  Neck: Normal range of motion. Neck supple.  Cardiovascular: Normal rate and regular rhythm.  Respiratory: Effort normal. No respiratory distress.  GI: Soft. She exhibits no distension.  Genitourinary:  Genitourinary Comments: deferred  Musculoskeletal:       Right hip: She exhibits decreased range of motion, decreased strength and bony tenderness.  deferred  Neurological: She is alert and oriented to person, place, and time. She has normal reflexes.  Skin: Skin is warm and dry.  Psychiatric: She has a normal mood and affect. Her behavior is normal. Thought content normal.    Vital signs in last 24 hours: @VSRANGES @  Labs:   Estimated body mass index is 39.79 kg/m as calculated from the following:   Height as of 01/06/18: 5\' 3"  (1.6 m).   Weight as of 01/06/18: 101.9 kg.   Imaging Review Plain radiographs demonstrate severe degenerative joint disease of the right hip(s). The bone quality appears to be  adequate for age and reported activity level.    Preoperative templating of the joint replacement has been completed, documented, and submitted to the Operating Room personnel in order to optimize intra-operative equipment management.  Assessment/Plan:  End stage arthritis, right hip(s)  The patient history, physical examination, clinical judgement of the provider and imaging studies are consistent with end stage degenerative joint disease of the right hip(s) and total hip arthroplasty is deemed medically necessary. The treatment options including medical management, injection therapy, arthroscopy and arthroplasty were discussed at length. The risks and benefits of total hip arthroplasty were presented and reviewed. The risks due to aseptic loosening, infection, stiffness, dislocation/subluxation,  thromboembolic complications and other imponderables were discussed.  The patient acknowledged the explanation, agreed to proceed with the plan and consent was signed. Patient is being admitted for inpatient treatment for surgery, pain control, PT, OT, prophylactic antibiotics, VTE prophylaxis, progressive ambulation and ADL's and discharge planning.The patient is planning to be discharged home with HEP. On chronic Eliquis.

## 2018-01-10 NOTE — Patient Instructions (Addendum)
Your procedure is scheduled on: Sept. 4, 2019   Surgery Time:  8:30AM-10:30AM   Report to [facility]  Entrance    Report to admitting at 6:00 AM   Call this number if you have problems the morning of surgery [phone]   Do not eat food or drink liquids :After Midnight.   Do NOT smoke after Midnight   Take these medicines the morning of surgery with A SIP OF WATER: Ezetimibe, Nebivolol, Pantoprazole   Bring Asthma Inhaler Day of surgery   Bring CPAP mask and tubing day of surgery   Take 1/2 (half) dinner dose of Toujeo the evening before surgery   DO NOT TAKE ANY DIABETIC MEDICATIONS DAY OF YOUR SURGERY                               You may not have any metal on your body including hair pins, jewelry, and body piercings             Do not wear make-up, lotions, powders, perfumes/cologne, or deodorant             Do not wear nail polish.  Do not shave  48 hours prior to surgery.                Do not bring valuables to the hospital. Industry.   Contacts, dentures or bridgework may not be worn into surgery.   Leave suitcase in the car. After surgery it may be brought to your room.   Special Instructions: Bring a copy of your healthcare power of attorney and living will documents         the day of surgery if you haven't scanned them in before.              Please read over the following fact sheets you were given:  [facility] - Preparing for Surgery Before surgery, you can play an important role.  Because skin is not sterile, your skin needs to be as free of germs as possible.  You can reduce the number of germs on your skin by washing with CHG (chlorahexidine gluconate) soap before surgery.  CHG is an antiseptic cleaner which kills germs and bonds with the skin to continue killing germs even after washing. Please DO NOT use if you have an allergy to CHG or antibacterial soaps.  If your skin becomes  reddened/irritated stop using the CHG and inform your nurse when you arrive at Short Stay. Do not shave (including legs and underarms) for at least 48 hours prior to the first CHG shower.  You may shave your face/neck.  Please follow these instructions carefully:  1.  Shower with CHG Soap the night before surgery and the  morning of surgery.  2.  If you choose to wash your hair, wash your hair first as usual with your normal  shampoo.  3.  After you shampoo, rinse your hair and body thoroughly to remove the shampoo.                             4.  Use CHG as you would any other liquid soap.  You can apply chg directly to the skin and wash.  Gently with a scrungie or clean  washcloth.  5.  Apply the CHG Soap to your body ONLY FROM THE NECK DOWN.   Do   not use on face/ open                           Wound or open sores. Avoid contact with eyes, ears mouth and   genitals (private parts).                       Wash face,  Genitals (private parts) with your normal soap.             6.  Wash thoroughly, paying special attention to the area where your    surgery  will be performed.  7.  Thoroughly rinse your body with warm water from the neck down.  8.  DO NOT shower/wash with your normal soap after using and rinsing off the CHG Soap.                9.  Pat yourself dry with a clean towel.            10.  Wear clean pajamas.            11.  Place clean sheets on your bed the night of your first shower and do not  sleep with pets. Day of Surgery : Do not apply any lotions/deodorants the morning of surgery.  Please wear clean clothes to the hospital/surgery center.  FAILURE TO FOLLOW THESE INSTRUCTIONS MAY RESULT IN THE CANCELLATION OF YOUR SURGERY  PATIENT SIGNATURE_________________________________  NURSE SIGNATURE__________________________________  ________________________________________________________________________   Stephanie Franco  An incentive spirometer is a tool that can help  keep your lungs clear and active. This tool measures how well you are filling your lungs with each breath. Taking long deep breaths may help reverse or decrease the chance of developing breathing (pulmonary) problems (especially infection) following:  A long period of time when you are unable to move or be active. BEFORE THE PROCEDURE   If the spirometer includes an indicator to show your best effort, your nurse or respiratory therapist will set it to a desired goal.  If possible, sit up straight or lean slightly forward. Try not to slouch.  Hold the incentive spirometer in an upright position. INSTRUCTIONS FOR USE  1. Sit on the edge of your bed if possible, or sit up as far as you can in bed or on a chair. 2. Hold the incentive spirometer in an upright position. 3. Breathe out normally. 4. Place the mouthpiece in your mouth and seal your lips tightly around it. 5. Breathe in slowly and as deeply as possible, raising the piston or the ball toward the top of the column. 6. Hold your breath for 3-5 seconds or for as long as possible. Allow the piston or ball to fall to the bottom of the column. 7. Remove the mouthpiece from your mouth and breathe out normally. 8. Rest for a few seconds and repeat Steps 1 through 7 at least 10 times every 1-2 hours when you are awake. Take your time and take a few normal breaths between deep breaths. 9. The spirometer may include an indicator to show your best effort. Use the indicator as a goal to work toward during each repetition. 10. After each set of 10 deep breaths, practice coughing to be sure your lungs are clear. If you have an incision (the cut made at the time of  surgery), support your incision when coughing by placing a pillow or rolled up towels firmly against it. Once you are able to get out of bed, walk around indoors and cough well. You may stop using the incentive spirometer when instructed by your caregiver.  RISKS AND COMPLICATIONS  Take your  time so you do not get dizzy or light-headed.  If you are in pain, you may need to take or ask for pain medication before doing incentive spirometry. It is harder to take a deep breath if you are having pain. AFTER USE  Rest and breathe slowly and easily.  It can be helpful to keep track of a log of your progress. Your caregiver can provide you with a simple table to help with this. If you are using the spirometer at home, follow these instructions: [place] IF:   You are having difficultly using the spirometer.  You have trouble using the spirometer as often as instructed.  Your pain medication is not giving enough relief while using the spirometer.  You develop fever of 100.5 F (38.1 C) or higher. SEEK IMMEDIATE MEDICAL CARE IF:   You cough up bloody sputum that had not been present before.  You develop fever of 102 F (38.9 C) or greater.  You develop worsening pain at or near the incision site. MAKE SURE YOU:   Understand these instructions.  Will watch your condition.  Will get help right away if you are not doing well or get worse. Document Released: 09/14/2006 Document Revised: 07/27/2011 Document Reviewed: 11/15/2006 ExitCare Patient Information 2014 ExitCare, [place].   ________________________________________________________________________  WHAT IS A BLOOD TRANSFUSION? Blood Transfusion Information  A transfusion is the replacement of blood or some of its parts. Blood is made up of multiple cells which provide different functions.  Red blood cells carry oxygen and are used for blood loss replacement.  White blood cells fight against infection.  Platelets control bleeding.  Plasma helps clot blood.  Other blood products are available for specialized needs, such as hemophilia or other clotting disorders. BEFORE THE TRANSFUSION  Who gives blood for transfusions?   Healthy volunteers who are fully evaluated to make sure their blood is safe. This  is blood bank blood. Transfusion therapy is the safest it has ever been in the practice of medicine. Before blood is taken from a donor, a complete history is taken to make sure that person has no history of diseases nor engages in risky social behavior (examples are intravenous drug use or sexual activity with multiple partners). The donor's travel history is screened to minimize risk of transmitting infections, such as malaria. The donated blood is tested for signs of infectious diseases, such as HIV and hepatitis. The blood is then tested to be sure it is compatible with you in order to minimize the chance of a transfusion reaction. If you or a relative donates blood, this is often done in anticipation of surgery and is not appropriate for emergency situations. It takes many days to process the donated blood. RISKS AND COMPLICATIONS Although transfusion therapy is very safe and saves many lives, the main dangers of transfusion include:   Getting an infectious disease.  Developing a transfusion reaction. This is an allergic reaction to something in the blood you were given. Every precaution is taken to prevent this. The decision to have a blood transfusion has been considered carefully by your caregiver before blood is given. Blood is not given unless the benefits outweigh the risks. AFTER THE  TRANSFUSION  Right after receiving a blood transfusion, you will usually feel much better and more energetic. This is especially true if your red blood cells have gotten low (anemic). The transfusion raises the level of the red blood cells which carry oxygen, and this usually causes an energy increase.  The nurse administering the transfusion will monitor you carefully for complications. HOME CARE INSTRUCTIONS  No special instructions are needed after a transfusion. You may find your energy is better. Speak with your caregiver about any limitations on activity for underlying diseases you may have. SEEK  MEDICAL CARE IF:   Your condition is not improving after your transfusion.  You develop redness or irritation at the intravenous (IV) site. SEEK IMMEDIATE MEDICAL CARE IF:  Any of the following symptoms occur over the next 12 hours:  Shaking chills.  You have a temperature by mouth above 102 F (38.9 C), not controlled by medicine.  Chest, back, or muscle pain.  People around you feel you are not acting correctly or are confused.  Shortness of breath or difficulty breathing.  Dizziness and fainting.  You get a rash or develop hives.  You have a decrease in urine output.  Your urine turns a dark color or changes to pink, red, or brown. Any of the following symptoms occur over the next 10 days:  You have a temperature by mouth above 102 F (38.9 C), not controlled by medicine.  Shortness of breath.  Weakness after normal activity.  The white part of the eye turns yellow (jaundice).  You have a decrease in the amount of urine or are urinating less often.  Your urine turns a dark color or changes to pink, red, or brown. Document Released: 05/01/2000 Document Revised: 07/27/2011 Document Reviewed: 12/19/2007 [facility] Patient Information 2014 [place], [place].  _______________________________________________________________________

## 2018-01-11 ENCOUNTER — Encounter (HOSPITAL_COMMUNITY): Payer: Self-pay

## 2018-01-11 ENCOUNTER — Encounter (HOSPITAL_COMMUNITY)
Admission: RE | Admit: 2018-01-11 | Discharge: 2018-01-11 | Disposition: A | Discharge status: Home / Self Care | Payer: Medicare Other | Source: Ambulatory Visit | Attending: Orthopedic Surgery | Admitting: Orthopedic Surgery

## 2018-01-11 ENCOUNTER — Other Ambulatory Visit: Payer: Self-pay

## 2018-01-11 DIAGNOSIS — Z01812 Encounter for preprocedural laboratory examination: Secondary | ICD-10-CM | POA: Insufficient documentation

## 2018-01-11 DIAGNOSIS — E1122 Type 2 diabetes mellitus with diabetic chronic kidney disease: Secondary | ICD-10-CM | POA: Insufficient documentation

## 2018-01-11 DIAGNOSIS — G4733 Obstructive sleep apnea (adult) (pediatric): Secondary | ICD-10-CM | POA: Insufficient documentation

## 2018-01-11 DIAGNOSIS — I428 Other cardiomyopathies: Secondary | ICD-10-CM | POA: Insufficient documentation

## 2018-01-11 DIAGNOSIS — I4891 Unspecified atrial fibrillation: Secondary | ICD-10-CM | POA: Insufficient documentation

## 2018-01-11 DIAGNOSIS — I129 Hypertensive chronic kidney disease with stage 1 through stage 4 chronic kidney disease, or unspecified chronic kidney disease: Secondary | ICD-10-CM | POA: Insufficient documentation

## 2018-01-11 DIAGNOSIS — M1611 Unilateral primary osteoarthritis, right hip: Secondary | ICD-10-CM | POA: Diagnosis not present

## 2018-01-11 DIAGNOSIS — Z6841 Body Mass Index (BMI) 40.0 and over, adult: Secondary | ICD-10-CM | POA: Diagnosis not present

## 2018-01-11 DIAGNOSIS — I272 Pulmonary hypertension, unspecified: Secondary | ICD-10-CM | POA: Insufficient documentation

## 2018-01-11 DIAGNOSIS — N183 Chronic kidney disease, stage 3 (moderate): Secondary | ICD-10-CM | POA: Diagnosis not present

## 2018-01-11 DIAGNOSIS — M7989 Other specified soft tissue disorders: Secondary | ICD-10-CM | POA: Diagnosis not present

## 2018-01-11 HISTORY — DX: Unspecified osteoarthritis, unspecified site: M19.90

## 2018-01-11 HISTORY — DX: Adverse effect of unspecified anesthetic, initial encounter: T41.45XA

## 2018-01-11 HISTORY — DX: Gastro-esophageal reflux disease without esophagitis: K21.9

## 2018-01-11 HISTORY — DX: Other forms of dyspnea: R06.09

## 2018-01-11 HISTORY — DX: Other complications of anesthesia, initial encounter: T88.59XA

## 2018-01-11 HISTORY — DX: Dyspnea, unspecified: R06.00

## 2018-01-11 HISTORY — DX: Cardiomegaly: I51.7

## 2018-01-11 HISTORY — DX: Hypokalemia: E87.6

## 2018-01-11 HISTORY — DX: Atherosclerosis of aorta: I70.0

## 2018-01-11 HISTORY — DX: Cardiac murmur, unspecified: R01.1

## 2018-01-11 LAB — BASIC METABOLIC PANEL
Anion gap: 9 (ref 5–15)
BUN: 26 mg/dL — ABNORMAL HIGH (ref 8–23)
CALCIUM: 10 mg/dL (ref 8.9–10.3)
CHLORIDE: 109 mmol/L (ref 98–111)
CO2: 28 mmol/L (ref 22–32)
Creatinine, Ser: 1.16 mg/dL — ABNORMAL HIGH (ref 0.44–1.00)
GFR calc Af Amer: 55 mL/min — ABNORMAL LOW (ref >60)
GFR calc non Af Amer: 47 mL/min — ABNORMAL LOW (ref >60)
GLUCOSE: 89 mg/dL (ref 70–99)
Potassium: 4.5 mmol/L (ref 3.5–5.1)
Sodium: 146 mmol/L — ABNORMAL HIGH (ref 135–145)

## 2018-01-11 LAB — CBC
HCT: 36.7 % (ref 36.0–46.0)
Hemoglobin: 11.5 g/dL — ABNORMAL LOW (ref 12.0–15.0)
MCH: 27.9 pg (ref 26.0–34.0)
MCHC: 31.3 g/dL (ref 30.0–36.0)
MCV: 89.1 fL (ref 78.0–100.0)
PLATELETS: 161 10*3/uL (ref 150–400)
RBC: 4.12 MIL/uL (ref 3.87–5.11)
RDW: 18.2 % — ABNORMAL HIGH (ref 11.5–15.5)
WBC: 5.2 10*3/uL (ref 4.0–10.5)

## 2018-01-11 LAB — ABO/RH: ABO/RH(D): B POS

## 2018-01-11 LAB — HEMOGLOBIN A1C
HEMOGLOBIN A1C: 6.1 % — AB (ref 4.8–5.6)
Mean Plasma Glucose: 128.37 mg/dL

## 2018-01-11 LAB — GLUCOSE, CAPILLARY: GLUCOSE-CAPILLARY: 105 mg/dL — AB (ref 70–99)

## 2018-01-11 LAB — SURGICAL PCR SCREEN
MRSA, PCR: NEGATIVE
Staphylococcus aureus: NEGATIVE

## 2018-01-11 NOTE — Pre-Procedure Instructions (Signed)
CBC, BMP, Hgb A1C results 01/11/2018 faxed to Dr. Lyla Glassing via epic.

## 2018-01-19 ENCOUNTER — Other Ambulatory Visit: Payer: Self-pay | Admitting: Orthopedic Surgery

## 2018-01-19 ENCOUNTER — Inpatient Hospital Stay (HOSPITAL_COMMUNITY): Payer: Medicare Other

## 2018-01-19 ENCOUNTER — Encounter (HOSPITAL_COMMUNITY): Payer: Self-pay | Admitting: *Deleted

## 2018-01-19 ENCOUNTER — Inpatient Hospital Stay (HOSPITAL_COMMUNITY): Payer: Medicare Other | Admitting: Certified Registered Nurse Anesthetist

## 2018-01-19 ENCOUNTER — Inpatient Hospital Stay (HOSPITAL_COMMUNITY)
Admission: RE | Admit: 2018-01-19 | Discharge: 2018-01-20 | DRG: 470 | Disposition: A | Discharge status: Home / Self Care | Payer: Medicare Other | Attending: Orthopedic Surgery | Admitting: Orthopedic Surgery

## 2018-01-19 ENCOUNTER — Other Ambulatory Visit: Payer: Self-pay

## 2018-01-19 ENCOUNTER — Ambulatory Visit: Payer: Medicare Other | Admitting: Cardiology

## 2018-01-19 ENCOUNTER — Encounter (HOSPITAL_COMMUNITY)
Admission: RE | Disposition: A | Discharge status: Home / Self Care | Payer: Self-pay | Source: Home / Self Care | Attending: Orthopedic Surgery

## 2018-01-19 DIAGNOSIS — I2510 Atherosclerotic heart disease of native coronary artery without angina pectoris: Secondary | ICD-10-CM | POA: Diagnosis not present

## 2018-01-19 DIAGNOSIS — J45909 Unspecified asthma, uncomplicated: Secondary | ICD-10-CM | POA: Diagnosis present

## 2018-01-19 DIAGNOSIS — I5042 Chronic combined systolic (congestive) and diastolic (congestive) heart failure: Secondary | ICD-10-CM | POA: Diagnosis not present

## 2018-01-19 DIAGNOSIS — M109 Gout, unspecified: Secondary | ICD-10-CM | POA: Diagnosis present

## 2018-01-19 DIAGNOSIS — Z794 Long term (current) use of insulin: Secondary | ICD-10-CM | POA: Diagnosis not present

## 2018-01-19 DIAGNOSIS — I272 Pulmonary hypertension, unspecified: Secondary | ICD-10-CM | POA: Diagnosis not present

## 2018-01-19 DIAGNOSIS — Z09 Encounter for follow-up examination after completed treatment for conditions other than malignant neoplasm: Secondary | ICD-10-CM

## 2018-01-19 DIAGNOSIS — Z955 Presence of coronary angioplasty implant and graft: Secondary | ICD-10-CM

## 2018-01-19 DIAGNOSIS — E785 Hyperlipidemia, unspecified: Secondary | ICD-10-CM | POA: Diagnosis present

## 2018-01-19 DIAGNOSIS — Z79899 Other long term (current) drug therapy: Secondary | ICD-10-CM | POA: Diagnosis not present

## 2018-01-19 DIAGNOSIS — Z6841 Body Mass Index (BMI) 40.0 and over, adult: Secondary | ICD-10-CM

## 2018-01-19 DIAGNOSIS — Z7951 Long term (current) use of inhaled steroids: Secondary | ICD-10-CM | POA: Diagnosis not present

## 2018-01-19 DIAGNOSIS — Z853 Personal history of malignant neoplasm of breast: Secondary | ICD-10-CM | POA: Diagnosis not present

## 2018-01-19 DIAGNOSIS — I700 Atherosclerosis of aorta: Secondary | ICD-10-CM | POA: Diagnosis present

## 2018-01-19 DIAGNOSIS — N183 Chronic kidney disease, stage 3 (moderate): Secondary | ICD-10-CM | POA: Diagnosis not present

## 2018-01-19 DIAGNOSIS — Z9010 Acquired absence of unspecified breast and nipple: Secondary | ICD-10-CM | POA: Diagnosis not present

## 2018-01-19 DIAGNOSIS — I482 Chronic atrial fibrillation: Secondary | ICD-10-CM | POA: Diagnosis not present

## 2018-01-19 DIAGNOSIS — K219 Gastro-esophageal reflux disease without esophagitis: Secondary | ICD-10-CM | POA: Diagnosis not present

## 2018-01-19 DIAGNOSIS — Z7901 Long term (current) use of anticoagulants: Secondary | ICD-10-CM

## 2018-01-19 DIAGNOSIS — G4733 Obstructive sleep apnea (adult) (pediatric): Secondary | ICD-10-CM | POA: Diagnosis not present

## 2018-01-19 DIAGNOSIS — I130 Hypertensive heart and chronic kidney disease with heart failure and stage 1 through stage 4 chronic kidney disease, or unspecified chronic kidney disease: Secondary | ICD-10-CM | POA: Diagnosis not present

## 2018-01-19 DIAGNOSIS — Z87891 Personal history of nicotine dependence: Secondary | ICD-10-CM | POA: Diagnosis not present

## 2018-01-19 DIAGNOSIS — I420 Dilated cardiomyopathy: Secondary | ICD-10-CM | POA: Diagnosis not present

## 2018-01-19 DIAGNOSIS — M1611 Unilateral primary osteoarthritis, right hip: Secondary | ICD-10-CM | POA: Diagnosis not present

## 2018-01-19 DIAGNOSIS — Z419 Encounter for procedure for purposes other than remedying health state, unspecified: Secondary | ICD-10-CM

## 2018-01-19 HISTORY — PX: TOTAL HIP ARTHROPLASTY: SHX124

## 2018-01-19 LAB — TYPE AND SCREEN
ABO/RH(D): B POS
Antibody Screen: NEGATIVE

## 2018-01-19 LAB — GLUCOSE, CAPILLARY
GLUCOSE-CAPILLARY: 107 mg/dL — AB (ref 70–99)
Glucose-Capillary: 125 mg/dL — ABNORMAL HIGH (ref 70–99)
Glucose-Capillary: 99 mg/dL (ref 70–99)
Glucose-Capillary: 122 mg/dL — ABNORMAL HIGH (ref 70–99)
Glucose-Capillary: 158 mg/dL — ABNORMAL HIGH (ref 70–99)

## 2018-01-19 SURGERY — ARTHROPLASTY, HIP, TOTAL, ANTERIOR APPROACH
Anesthesia: Spinal | Site: Hip | Laterality: Right

## 2018-01-19 MED ORDER — CHLORHEXIDINE GLUCONATE 4 % EX LIQD: 60.0000 mL | Freq: Once | CUTANEOUS | Status: DC

## 2018-01-19 MED ORDER — ALBUTEROL SULFATE HFA 108 (90 BASE) MCG/ACT IN AERS: 1.0000 | INHALATION_SPRAY | Freq: Four times a day (QID) | RESPIRATORY_TRACT | Status: DC | PRN

## 2018-01-19 MED ORDER — ONDANSETRON HCL 4 MG/2ML IJ SOLN
INTRAMUSCULAR | Status: DC | PRN
  Administered 2018-01-19: 4 mg via INTRAVENOUS

## 2018-01-19 MED ORDER — BUPIVACAINE-EPINEPHRINE 0.25% -1:200000 IJ SOLN
INTRAMUSCULAR | Status: DC | PRN
  Administered 2018-01-19: 30 mL

## 2018-01-19 MED ORDER — APIXABAN 2.5 MG PO TABS
2.5000 mg | ORAL_TABLET | Freq: Two times a day (BID) | ORAL | Status: DC
  Administered 2018-01-20: 2.5 mg via ORAL
  Filled 2018-01-19: qty 1

## 2018-01-19 MED ORDER — PROPOFOL 10 MG/ML IV BOLUS
INTRAVENOUS | Status: AC
  Filled 2018-01-19: qty 80

## 2018-01-19 MED ORDER — PHENYLEPHRINE HCL 10 MG/ML IJ SOLN
INTRAMUSCULAR | Status: AC
  Filled 2018-01-19: qty 6

## 2018-01-19 MED ORDER — COLCHICINE 0.6 MG PO TABS
0.6000 mg | ORAL_TABLET | Freq: Every evening | ORAL | Status: DC
  Administered 2018-01-19: 0.6 mg via ORAL
  Filled 2018-01-19: qty 1

## 2018-01-19 MED ORDER — POTASSIUM CHLORIDE CRYS ER 20 MEQ PO TBCR
20.0000 meq | EXTENDED_RELEASE_TABLET | Freq: Two times a day (BID) | ORAL | Status: DC
  Administered 2018-01-20: 20 meq via ORAL
  Filled 2018-01-19 (×2): qty 1

## 2018-01-19 MED ORDER — PROPOFOL 10 MG/ML IV BOLUS
INTRAVENOUS | Status: AC
  Filled 2018-01-19: qty 20

## 2018-01-19 MED ORDER — SODIUM CHLORIDE 0.9 % IJ SOLN
INTRAMUSCULAR | Status: AC
  Filled 2018-01-19: qty 50

## 2018-01-19 MED ORDER — HYDROMORPHONE HCL 1 MG/ML IJ SOLN
INTRAMUSCULAR | Status: AC
  Filled 2018-01-19: qty 2

## 2018-01-19 MED ORDER — METHOCARBAMOL 500 MG IVPB - SIMPLE MED
INTRAVENOUS | Status: AC
  Administered 2018-01-19: 500 mg
  Filled 2018-01-19: qty 50

## 2018-01-19 MED ORDER — DEXAMETHASONE SODIUM PHOSPHATE 10 MG/ML IJ SOLN
INTRAMUSCULAR | Status: AC
  Filled 2018-01-19: qty 1

## 2018-01-19 MED ORDER — MIDAZOLAM HCL 5 MG/5ML IJ SOLN
INTRAMUSCULAR | Status: DC | PRN
  Administered 2018-01-19: 2 mg via INTRAVENOUS

## 2018-01-19 MED ORDER — WATER FOR IRRIGATION, STERILE IR SOLN
Status: DC | PRN
  Administered 2018-01-19: 2000 mL

## 2018-01-19 MED ORDER — IRBESARTAN 150 MG PO TABS: 300.0000 mg | ORAL_TABLET | Freq: Every day | ORAL | Status: DC

## 2018-01-19 MED ORDER — DEXAMETHASONE SODIUM PHOSPHATE 10 MG/ML IJ SOLN
10.0000 mg | Freq: Once | INTRAMUSCULAR | Status: AC
  Administered 2018-01-20: 10 mg via INTRAVENOUS
  Filled 2018-01-19: qty 1

## 2018-01-19 MED ORDER — NEBIVOLOL HCL 10 MG PO TABS
10.0000 mg | ORAL_TABLET | Freq: Every day | ORAL | Status: DC
  Administered 2018-01-20: 10 mg via ORAL
  Filled 2018-01-19: qty 1

## 2018-01-19 MED ORDER — HYDROCODONE-ACETAMINOPHEN 5-325 MG PO TABS
1.0000 | ORAL_TABLET | ORAL | Status: DC | PRN
  Administered 2018-01-20: 1 via ORAL
  Filled 2018-01-19: qty 1

## 2018-01-19 MED ORDER — PHENOL 1.4 % MT LIQD
1.0000 | OROMUCOSAL | Status: DC | PRN
  Filled 2018-01-19: qty 177

## 2018-01-19 MED ORDER — MORPHINE SULFATE (PF) 4 MG/ML IV SOLN
INTRAVENOUS | Status: AC
  Filled 2018-01-19: qty 1

## 2018-01-19 MED ORDER — ACETAMINOPHEN 10 MG/ML IV SOLN
1000.0000 mg | INTRAVENOUS | Status: AC
  Administered 2018-01-19: 1000 mg via INTRAVENOUS
  Filled 2018-01-19: qty 100

## 2018-01-19 MED ORDER — INSULIN GLARGINE 100 UNIT/ML ~~LOC~~ SOLN
28.0000 [IU] | Freq: Every day | SUBCUTANEOUS | Status: DC
  Administered 2018-01-19: 28 [IU] via SUBCUTANEOUS
  Filled 2018-01-19 (×2): qty 0.28

## 2018-01-19 MED ORDER — ALBUTEROL SULFATE (2.5 MG/3ML) 0.083% IN NEBU: 2.5000 mg | INHALATION_SOLUTION | Freq: Four times a day (QID) | RESPIRATORY_TRACT | Status: DC | PRN

## 2018-01-19 MED ORDER — MIDAZOLAM HCL 2 MG/2ML IJ SOLN
INTRAMUSCULAR | Status: AC
  Filled 2018-01-19: qty 2

## 2018-01-19 MED ORDER — ISOSORBIDE MONONITRATE ER 60 MG PO TB24
60.0000 mg | ORAL_TABLET | Freq: Every day | ORAL | Status: DC
  Administered 2018-01-19 – 2018-01-20 (×2): 60 mg via ORAL
  Filled 2018-01-19 (×2): qty 1

## 2018-01-19 MED ORDER — LORAZEPAM 1 MG PO TABS
1.0000 mg | ORAL_TABLET | Freq: Two times a day (BID) | ORAL | Status: DC
  Administered 2018-01-19: 1 mg via ORAL
  Filled 2018-01-19: qty 1

## 2018-01-19 MED ORDER — MORPHINE SULFATE (PF) 2 MG/ML IV SOLN: 0.5000 mg | INTRAVENOUS | Status: DC | PRN

## 2018-01-19 MED ORDER — SODIUM CHLORIDE 0.9 % IV SOLN
INTRAVENOUS | Status: DC
  Administered 2018-01-19 (×2): via INTRAVENOUS

## 2018-01-19 MED ORDER — KETOROLAC TROMETHAMINE 30 MG/ML IJ SOLN
INTRAMUSCULAR | Status: DC | PRN
  Administered 2018-01-19: 30 mg via INTRAVENOUS

## 2018-01-19 MED ORDER — LIDOCAINE 2% (20 MG/ML) 5 ML SYRINGE
INTRAMUSCULAR | Status: AC
  Filled 2018-01-19: qty 5

## 2018-01-19 MED ORDER — MORPHINE SULFATE (PF) 4 MG/ML IV SOLN
1.0000 mg | INTRAVENOUS | Status: DC | PRN
  Administered 2018-01-19 (×2): 2 mg via INTRAVENOUS

## 2018-01-19 MED ORDER — ACETAMINOPHEN 325 MG PO TABS: 325.0000 mg | ORAL_TABLET | Freq: Four times a day (QID) | ORAL | Status: DC | PRN

## 2018-01-19 MED ORDER — MENTHOL 3 MG MT LOZG: 1.0000 | LOZENGE | OROMUCOSAL | Status: DC | PRN

## 2018-01-19 MED ORDER — SENNA 8.6 MG PO TABS
1.0000 | ORAL_TABLET | Freq: Two times a day (BID) | ORAL | Status: DC
  Administered 2018-01-19: 8.6 mg via ORAL
  Filled 2018-01-19 (×2): qty 1

## 2018-01-19 MED ORDER — ALLOPURINOL 300 MG PO TABS
300.0000 mg | ORAL_TABLET | Freq: Every day | ORAL | Status: DC
  Administered 2018-01-19: 300 mg via ORAL
  Filled 2018-01-19: qty 1

## 2018-01-19 MED ORDER — PHENYLEPHRINE 40 MCG/ML (10ML) SYRINGE FOR IV PUSH (FOR BLOOD PRESSURE SUPPORT)
PREFILLED_SYRINGE | INTRAVENOUS | Status: AC
  Filled 2018-01-19: qty 10

## 2018-01-19 MED ORDER — NITROGLYCERIN 0.4 MG SL SUBL: 0.4000 mg | SUBLINGUAL_TABLET | SUBLINGUAL | Status: DC | PRN

## 2018-01-19 MED ORDER — SODIUM CHLORIDE 0.9 % IV SOLN: INTRAVENOUS | Status: DC

## 2018-01-19 MED ORDER — HYDROMORPHONE HCL 1 MG/ML IJ SOLN
0.2500 mg | INTRAMUSCULAR | Status: DC | PRN
  Administered 2018-01-19 (×4): 0.5 mg via INTRAVENOUS

## 2018-01-19 MED ORDER — ISOPROPYL ALCOHOL 70 % SOLN
Status: AC
  Filled 2018-01-19: qty 480

## 2018-01-19 MED ORDER — ONDANSETRON HCL 4 MG PO TABS: 4.0000 mg | ORAL_TABLET | Freq: Four times a day (QID) | ORAL | Status: DC | PRN

## 2018-01-19 MED ORDER — SODIUM CHLORIDE 0.9 % IJ SOLN
INTRAMUSCULAR | Status: DC | PRN
  Administered 2018-01-19: 30 mL via INTRAVENOUS

## 2018-01-19 MED ORDER — LIDOCAINE HCL (CARDIAC) PF 100 MG/5ML IV SOSY
PREFILLED_SYRINGE | INTRAVENOUS | Status: DC | PRN
  Administered 2018-01-19: 80 mg via INTRAVENOUS

## 2018-01-19 MED ORDER — ONDANSETRON HCL 4 MG/2ML IJ SOLN
INTRAMUSCULAR | Status: AC
  Filled 2018-01-19: qty 2

## 2018-01-19 MED ORDER — PROPOFOL 10 MG/ML IV BOLUS
INTRAVENOUS | Status: DC | PRN
  Administered 2018-01-19 (×7): 20 mg via INTRAVENOUS

## 2018-01-19 MED ORDER — FUROSEMIDE 40 MG PO TABS
80.0000 mg | ORAL_TABLET | Freq: Two times a day (BID) | ORAL | Status: DC
  Administered 2018-01-20: 80 mg via ORAL
  Filled 2018-01-19: qty 2

## 2018-01-19 MED ORDER — ONDANSETRON HCL 4 MG/2ML IJ SOLN: 4.0000 mg | Freq: Four times a day (QID) | INTRAMUSCULAR | Status: DC | PRN

## 2018-01-19 MED ORDER — METOCLOPRAMIDE HCL 5 MG/ML IJ SOLN: 5.0000 mg | Freq: Three times a day (TID) | INTRAMUSCULAR | Status: DC | PRN

## 2018-01-19 MED ORDER — LACTATED RINGERS IV SOLN
INTRAVENOUS | Status: DC
  Administered 2018-01-19 (×2): via INTRAVENOUS

## 2018-01-19 MED ORDER — EZETIMIBE 10 MG PO TABS
10.0000 mg | ORAL_TABLET | Freq: Every day | ORAL | Status: DC
  Administered 2018-01-20: 10 mg via ORAL
  Filled 2018-01-19: qty 1

## 2018-01-19 MED ORDER — TRANEXAMIC ACID 1000 MG/10ML IV SOLN
1000.0000 mg | INTRAVENOUS | Status: AC
  Administered 2018-01-19: 1000 mg via INTRAVENOUS
  Filled 2018-01-19: qty 10

## 2018-01-19 MED ORDER — SODIUM CHLORIDE 0.9 % IR SOLN
Status: DC | PRN
  Administered 2018-01-19: 1000 mL

## 2018-01-19 MED ORDER — FENTANYL CITRATE (PF) 100 MCG/2ML IJ SOLN
INTRAMUSCULAR | Status: DC | PRN
  Administered 2018-01-19: 25 ug via INTRAVENOUS

## 2018-01-19 MED ORDER — DIPHENHYDRAMINE HCL 12.5 MG/5ML PO ELIX: 12.5000 mg | ORAL_SOLUTION | ORAL | Status: DC | PRN

## 2018-01-19 MED ORDER — POLYETHYLENE GLYCOL 3350 17 G PO PACK: 17.0000 g | PACK | Freq: Every day | ORAL | Status: DC | PRN

## 2018-01-19 MED ORDER — ALUM & MAG HYDROXIDE-SIMETH 200-200-20 MG/5ML PO SUSP: 30.0000 mL | ORAL | Status: DC | PRN

## 2018-01-19 MED ORDER — ISOPROPYL ALCOHOL 70 % SOLN
Status: DC | PRN
  Administered 2018-01-19: 1 via TOPICAL

## 2018-01-19 MED ORDER — FENTANYL CITRATE (PF) 100 MCG/2ML IJ SOLN
INTRAMUSCULAR | Status: AC
  Filled 2018-01-19: qty 2

## 2018-01-19 MED ORDER — KETOROLAC TROMETHAMINE 15 MG/ML IJ SOLN
7.5000 mg | Freq: Four times a day (QID) | INTRAMUSCULAR | Status: AC
  Administered 2018-01-19 – 2018-01-20 (×4): 7.5 mg via INTRAVENOUS
  Filled 2018-01-19 (×4): qty 1

## 2018-01-19 MED ORDER — PANTOPRAZOLE SODIUM 40 MG PO TBEC
40.0000 mg | DELAYED_RELEASE_TABLET | Freq: Every day | ORAL | Status: DC
  Administered 2018-01-20: 40 mg via ORAL
  Filled 2018-01-19: qty 1

## 2018-01-19 MED ORDER — BUPIVACAINE-EPINEPHRINE (PF) 0.25% -1:200000 IJ SOLN
INTRAMUSCULAR | Status: AC
  Filled 2018-01-19: qty 30

## 2018-01-19 MED ORDER — POVIDONE-IODINE 10 % EX SWAB
2.0000 "application " | Freq: Once | CUTANEOUS | Status: AC
  Administered 2018-01-19: 2 via TOPICAL

## 2018-01-19 MED ORDER — METHOCARBAMOL 500 MG IVPB - SIMPLE MED
500.0000 mg | Freq: Four times a day (QID) | INTRAVENOUS | Status: DC | PRN
  Filled 2018-01-19: qty 50

## 2018-01-19 MED ORDER — METOCLOPRAMIDE HCL 5 MG PO TABS: 5.0000 mg | ORAL_TABLET | Freq: Three times a day (TID) | ORAL | Status: DC | PRN

## 2018-01-19 MED ORDER — DEXAMETHASONE SODIUM PHOSPHATE 10 MG/ML IJ SOLN
INTRAMUSCULAR | Status: DC | PRN
  Administered 2018-01-19: 4 mg via INTRAVENOUS

## 2018-01-19 MED ORDER — HYDROCODONE-ACETAMINOPHEN 7.5-325 MG PO TABS
1.0000 | ORAL_TABLET | ORAL | Status: DC | PRN
  Administered 2018-01-19 (×2): 1 via ORAL
  Filled 2018-01-19 (×2): qty 1

## 2018-01-19 MED ORDER — PHENYLEPHRINE 40 MCG/ML (10ML) SYRINGE FOR IV PUSH (FOR BLOOD PRESSURE SUPPORT)
PREFILLED_SYRINGE | INTRAVENOUS | Status: DC | PRN
  Administered 2018-01-19 (×2): 80 ug via INTRAVENOUS

## 2018-01-19 MED ORDER — BUPIVACAINE IN DEXTROSE 0.75-8.25 % IT SOLN
INTRATHECAL | Status: DC | PRN
  Administered 2018-01-19: 1.8 mL via INTRATHECAL

## 2018-01-19 MED ORDER — ADULT MULTIVITAMIN W/MINERALS CH
1.0000 | ORAL_TABLET | Freq: Every day | ORAL | Status: DC
  Administered 2018-01-20: 1 via ORAL
  Filled 2018-01-19: qty 1

## 2018-01-19 MED ORDER — FLUTICASONE PROPIONATE 50 MCG/ACT NA SUSP
2.0000 | Freq: Every day | NASAL | Status: DC | PRN
  Filled 2018-01-19: qty 16

## 2018-01-19 MED ORDER — CEFAZOLIN SODIUM-DEXTROSE 2-4 GM/100ML-% IV SOLN
2.0000 g | INTRAVENOUS | Status: AC
  Administered 2018-01-19: 2 g via INTRAVENOUS
  Filled 2018-01-19: qty 100

## 2018-01-19 MED ORDER — ATORVASTATIN CALCIUM 40 MG PO TABS
40.0000 mg | ORAL_TABLET | Freq: Every day | ORAL | Status: DC
  Administered 2018-01-19 – 2018-01-20 (×2): 40 mg via ORAL
  Filled 2018-01-19: qty 1

## 2018-01-19 MED ORDER — PROPOFOL 500 MG/50ML IV EMUL
INTRAVENOUS | Status: DC | PRN
  Administered 2018-01-19: 100 ug/kg/min via INTRAVENOUS

## 2018-01-19 MED ORDER — DOCUSATE SODIUM 100 MG PO CAPS
100.0000 mg | ORAL_CAPSULE | Freq: Two times a day (BID) | ORAL | Status: DC
  Administered 2018-01-19 – 2018-01-20 (×2): 100 mg via ORAL
  Filled 2018-01-19 (×2): qty 1

## 2018-01-19 MED ORDER — PROMETHAZINE HCL 25 MG/ML IJ SOLN: 6.2500 mg | INTRAMUSCULAR | Status: DC | PRN

## 2018-01-19 MED ORDER — KETOROLAC TROMETHAMINE 30 MG/ML IJ SOLN
INTRAMUSCULAR | Status: AC
  Filled 2018-01-19: qty 1

## 2018-01-19 MED ORDER — LORATADINE 10 MG PO TABS
10.0000 mg | ORAL_TABLET | Freq: Every day | ORAL | Status: DC
  Administered 2018-01-20: 10 mg via ORAL
  Filled 2018-01-19 (×2): qty 1

## 2018-01-19 MED ORDER — FERROUS SULFATE 325 (65 FE) MG PO TABS
325.0000 mg | ORAL_TABLET | Freq: Every day | ORAL | Status: DC
  Administered 2018-01-20: 325 mg via ORAL
  Filled 2018-01-19: qty 1

## 2018-01-19 MED ORDER — CEFAZOLIN SODIUM-DEXTROSE 2-4 GM/100ML-% IV SOLN
2.0000 g | Freq: Four times a day (QID) | INTRAVENOUS | Status: AC
  Administered 2018-01-19 (×2): 2 g via INTRAVENOUS
  Filled 2018-01-19 (×2): qty 100

## 2018-01-19 MED ORDER — INSULIN ASPART 100 UNIT/ML ~~LOC~~ SOLN
0.0000 [IU] | Freq: Three times a day (TID) | SUBCUTANEOUS | Status: DC
  Administered 2018-01-19: 2 [IU] via SUBCUTANEOUS

## 2018-01-19 MED ORDER — SODIUM CHLORIDE 0.9 % IV SOLN
INTRAVENOUS | Status: DC | PRN
  Administered 2018-01-19: 50 ug/min via INTRAVENOUS

## 2018-01-19 MED ORDER — METHOCARBAMOL 500 MG PO TABS
500.0000 mg | ORAL_TABLET | Freq: Four times a day (QID) | ORAL | Status: DC | PRN
  Administered 2018-01-19 – 2018-01-20 (×2): 500 mg via ORAL
  Filled 2018-01-19 (×2): qty 1

## 2018-01-19 SURGICAL SUPPLY — 50 items
BAG DECANTER FOR FLEXI CONT (MISCELLANEOUS) IMPLANT
BAG ZIPLOCK 12X15 (MISCELLANEOUS) IMPLANT
CHLORAPREP W/TINT 26ML (MISCELLANEOUS) ×3 IMPLANT
CLOTH BEACON ORANGE TIMEOUT ST (SAFETY) ×3 IMPLANT
COVER PERINEAL POST (MISCELLANEOUS) ×3 IMPLANT
COVER SURGICAL LIGHT HANDLE (MISCELLANEOUS) ×3 IMPLANT
CUP SECTOR GRIPTON 50MM (Cup) ×3 IMPLANT
DECANTER SPIKE VIAL GLASS SM (MISCELLANEOUS) ×3 IMPLANT
DERMABOND ADVANCED (GAUZE/BANDAGES/DRESSINGS) ×4
DERMABOND ADVANCED .7 DNX12 (GAUZE/BANDAGES/DRESSINGS) ×2 IMPLANT
DRAPE SHEET LG 3/4 BI-LAMINATE (DRAPES) ×9 IMPLANT
DRAPE STERI IOBAN 125X83 (DRAPES) ×3 IMPLANT
DRAPE U-SHAPE 47X51 STRL (DRAPES) ×6 IMPLANT
DRESSING PREVENA PLUS CUSTOM (GAUZE/BANDAGES/DRESSINGS) ×1 IMPLANT
DRSG AQUACEL AG ADV 3.5X10 (GAUZE/BANDAGES/DRESSINGS) ×3 IMPLANT
DRSG PREVENA PLUS CUSTOM (GAUZE/BANDAGES/DRESSINGS) ×3
ELECT PENCIL ROCKER SW 15FT (MISCELLANEOUS) ×3 IMPLANT
ELECT REM PT RETURN 15FT ADLT (MISCELLANEOUS) ×3 IMPLANT
GAUZE SPONGE 4X4 12PLY STRL (GAUZE/BANDAGES/DRESSINGS) ×3 IMPLANT
GLOVE BIO SURGEON STRL SZ8.5 (GLOVE) ×6 IMPLANT
GLOVE BIOGEL PI IND STRL 8.5 (GLOVE) ×1 IMPLANT
GLOVE BIOGEL PI INDICATOR 8.5 (GLOVE) ×2
GOWN SPEC L3 XXLG W/TWL (GOWN DISPOSABLE) ×3 IMPLANT
HANDPIECE INTERPULSE COAX TIP (DISPOSABLE) ×2
HEAD FEMORAL 32 CERAMIC (Hips) ×3 IMPLANT
HOLDER FOLEY CATH W/STRAP (MISCELLANEOUS) ×3 IMPLANT
HOOD PEEL AWAY FLYTE STAYCOOL (MISCELLANEOUS) ×9 IMPLANT
LINER ACETABULAR 32X50 (Liner) ×3 IMPLANT
MARKER SKIN DUAL TIP RULER LAB (MISCELLANEOUS) ×3 IMPLANT
NEEDLE SPNL 18GX3.5 QUINCKE PK (NEEDLE) ×3 IMPLANT
PACK ANTERIOR HIP CUSTOM (KITS) ×3 IMPLANT
SAW OSC TIP CART 19.5X105X1.3 (SAW) ×3 IMPLANT
SEALER BIPOLAR AQUA 6.0 (INSTRUMENTS) ×3 IMPLANT
SET HNDPC FAN SPRY TIP SCT (DISPOSABLE) ×1 IMPLANT
STEM TRI LOC GRIPTION SZ 5 STD ×1 IMPLANT
SUT ETHIBOND NAB CT1 #1 30IN (SUTURE) ×6 IMPLANT
SUT ETHILON 2 0 PSLX (SUTURE) ×6 IMPLANT
SUT MNCRL AB 3-0 PS2 18 (SUTURE) IMPLANT
SUT MON AB 2-0 CT1 36 (SUTURE) ×9 IMPLANT
SUT STRATAFIX PDO 1 14 VIOLET (SUTURE) ×2
SUT STRATFX PDO 1 14 VIOLET (SUTURE) ×1
SUT VIC AB 2-0 CT1 27 (SUTURE) ×2
SUT VIC AB 2-0 CT1 TAPERPNT 27 (SUTURE) ×1 IMPLANT
SUTURE STRATFX PDO 1 14 VIOLET (SUTURE) ×1 IMPLANT
SYR 50ML LL SCALE MARK (SYRINGE) ×3 IMPLANT
TRAY FOLEY MTR SLVR 14FR STAT (SET/KITS/TRAYS/PACK) ×3 IMPLANT
TRAY FOLEY MTR SLVR 16FR STAT (SET/KITS/TRAYS/PACK) IMPLANT
TRI LOC GRIPTION SZ 5 STD ×3 IMPLANT
WATER STERILE IRR 1000ML POUR (IV SOLUTION) ×6 IMPLANT
YANKAUER SUCT BULB TIP 10FT TU (MISCELLANEOUS) ×3 IMPLANT

## 2018-01-19 NOTE — Progress Notes (Signed)
Continuous spinal catheter removed, tip intact. Wound site clean

## 2018-01-19 NOTE — Transfer of Care (Signed)
Immediate Anesthesia Transfer of Care Note  Patient: Stephanie Franco  Procedure(s) Performed: RIGHT TOTAL HIP ARTHROPLASTY ANTERIOR APPROACH (Right Hip)  Patient Location: PACU  Anesthesia Type:Spinal  Level of Consciousness: awake, alert , oriented and patient cooperative  Airway & Oxygen Therapy: Patient Spontanous Breathing and Patient connected to face mask oxygen  Post-op Assessment: Report given to RN, Post -op Vital signs reviewed and stable and Patient moving all extremities  Post vital signs: Reviewed and stable  Last Vitals:  Vitals Value Taken Time  BP 142/125 01/19/2018 11:45 AM  Temp    Pulse 72 01/19/2018 11:48 AM  Resp 25 01/19/2018 11:48 AM  SpO2 100 % 01/19/2018 11:48 AM  Vitals shown include unvalidated device data.  Last Pain:  Vitals:   01/19/18 0656  TempSrc: Oral         Complications: No apparent anesthesia complications

## 2018-01-19 NOTE — Anesthesia Procedure Notes (Signed)
Spinal  Patient location during procedure: OR Start time: 01/19/2018 8:39 AM End time: 01/19/2018 9:05 AM Staffing Anesthesiologist: Myrtie Soman, MD Performed: anesthesiologist  Preanesthetic Checklist Completed: patient identified, site marked, surgical consent, pre-op evaluation, timeout performed, IV checked, risks and benefits discussed and monitors and equipment checked Spinal Block Patient position: sitting Prep: ChloraPrep Patient monitoring: heart rate, continuous pulse ox and blood pressure Location: L3-4 Injection technique: catheter Needle Needle type: Tuohy  Needle gauge: 18G. Needle length: 9 cm Needle insertion depth: 7 cm Catheter size: 19 g Catheter at skin depth: 8 cm Assessment Sensory level: T6 Additional Notes Expiration date of kit checked and confirmed. Patient tolerated procedure well, without complications.  SAB attempt x 4 with 22G and 24G needles. Decided to attempt a continous spinal, secondary to severe pulmonary hypertension via a Tuohy needle Catheter will be removed immediately post procedure

## 2018-01-19 NOTE — Evaluation (Signed)
Physical Therapy Evaluation Patient Details Name: Stephanie Franco MRN: [id number] DOB: 1950-10-21 Today's Date: 01/19/2018   History of Present Illness  Pt is a 67 YO female s/p R DA-THA on 01/18/18. PMH includes pulmonary HTN, congestive dilated cardiomyopathy essentially resolved, CHF, hypokalemia, pyelonephritis, obesity, leg cramping, DOE, afib, DM II, OSA, HTN, atherosclerosis, breast cancer and mastectomy 2008, coronary angioplasty with stent 2006, cardiomegaly, CKD III, HLD.  Clinical Impression   Pt s/p R DA-THA on 01/18/18. PMH as listed above. Pt presents with R hip pain, difficulty with mobility tasks, and decreased activity tolerance for ambulation. Pt also slightly lethargic secondary to medications today, did not interfere with session. Pt to benefit from acute PT to address deficits. Pt ambulated 20 ft with RW with min guard assist today. Will progress mobility as able, and will continue to follow acutely.     Follow Up Recommendations Follow surgeon's recommendation for DC plan and follow-up therapies(Pt home with HEP )    Equipment Recommendations  None recommended by PT    Recommendations for Other Services       Precautions / Restrictions Precautions Precautions: Fall Restrictions Weight Bearing Restrictions: No Other Position/Activity Restrictions: WBAT       Mobility  Bed Mobility Overal bed mobility: Needs Assistance Bed Mobility: Supine to Sit     Supine to sit: Min assist;HOB elevated     General bed mobility comments: Assist for RLE management, trunk elevation from bed. Increased time to perform scooting to EOB.   Transfers Overall transfer level: Needs assistance Equipment used: Rolling walker (2 wheeled) Transfers: Sit to/from Stand Sit to Stand: Min assist         General transfer comment: Min assist for power up, hip extension, and steadying upon standing.   Ambulation/Gait Ambulation/Gait assistance: Min assist Gait Distance (Feet): 20  Feet Assistive device: Rolling walker (2 wheeled) Gait Pattern/deviations: Step-to pattern;Decreased stride length;Decreased stance time - right;Decreased weight shift to right;Antalgic;Trunk flexed Gait velocity: Very decreased initially, progressing gait speed some with continued ambulation.    General Gait Details: Min assist for directing RW, sequencing.   Stairs            Wheelchair Mobility    Modified Rankin (Stroke Patients Only)       Balance Overall balance assessment: Mild deficits observed, not formally tested                                           Pertinent Vitals/Pain Pain Assessment: 0-10 Pain Score: 3  Pain Location: R hip  Pain Descriptors / Indicators: Aching Pain Intervention(s): Limited activity within patient's tolerance;Ice applied;Monitored during session    [place] expects to be discharged to:: Private residence Living Arrangements: Spouse/significant other Available Help at Discharge: Family;Available PRN/intermittently Type of Home: Apartment Home Access: Level entry     Home Layout: One level Home Equipment: Walker - 2 wheels;Cane - single point      Prior Function Level of Independence: Independent with assistive device(s)         Comments: using cane prior to coming to hospital     Hand Dominance   Dominant Hand: Right    Extremity/Trunk Assessment   Upper Extremity Assessment Upper Extremity Assessment: Overall WFL for tasks assessed    Lower Extremity Assessment Lower Extremity Assessment: Generalized weakness;RLE deficits/detail RLE Deficits / Details: Suspected post-surgical hip weakness, able to perform  SLR x1, quad set x3, ankle pumps x10.  RLE Sensation: WNL    Cervical / Trunk Assessment Cervical / Trunk Assessment: Normal  Communication   Communication: No difficulties  Cognition Arousal/Alertness: Awake/alert Behavior During Therapy: WFL for tasks  assessed/performed Overall Cognitive Status: Within Functional Limits for tasks assessed                                        General Comments      Exercises Total Joint Exercises Ankle Circles/Pumps: AROM;Both;10 reps;Supine Quad Sets: AROM;Right;Supine(3 reps ) Heel Slides: AAROM;Right;Supine(3)   Assessment/Plan    PT Assessment Patient needs continued PT services  PT Problem List Decreased strength;Pain;Decreased range of motion;Decreased activity tolerance;Decreased knowledge of use of DME;Decreased balance;Decreased mobility       PT Treatment Interventions DME instruction;Therapeutic activities;Gait training;Therapeutic exercise;Patient/family education;Balance training;Functional mobility training    PT Goals (Current goals can be found in the Care Plan section)  Acute Rehab PT Goals PT Goal Formulation: With patient Time For Goal Achievement: 02/02/18 Potential to Achieve Goals: Good    Frequency 7X/week   Barriers to discharge        Co-evaluation               AM-PAC PT "6 Clicks" Daily Activity  Outcome Measure Difficulty turning over in bed (including adjusting bedclothes, sheets and blankets)?: Unable Difficulty moving from lying on back to sitting on the side of the bed? : Unable Difficulty sitting down on and standing up from a chair with arms (e.g., wheelchair, bedside commode, etc,.)?: Unable Help needed moving to and from a bed to chair (including a wheelchair)?: A Little Help needed walking in hospital room?: A Little Help needed climbing 3-5 steps with a railing? : A Lot 6 Click Score: 11    End of Session Equipment Utilized During Treatment: Gait belt Activity Tolerance: Patient tolerated treatment well;Patient limited by lethargy Patient left: in chair;with chair alarm set;with call bell/phone within reach;with family/visitor present Nurse Communication: Mobility status PT Visit Diagnosis: Difficulty in walking, not  elsewhere classified (R26.2);Other abnormalities of gait and mobility (R26.89)    Time: 1594-5859 PT Time Calculation (min) (ACUTE ONLY): 32 min   Charges:   PT Evaluation $PT Eval Low Complexity: 1 Low PT Treatments $Gait Training: 8-22 mins        Beya Tipps Conception Chancy, PT, DPT  Pager # [phone]    Neera Teng D Kiante Petrovich 01/19/2018, 5:20 PM

## 2018-01-19 NOTE — Op Note (Signed)
OPERATIVE REPORT  SURGEON: Rod Can, MD   ASSISTANT: Nehemiah Massed, PA-C.  PREOPERATIVE DIAGNOSIS: Right hip arthritis.   POSTOPERATIVE DIAGNOSIS: Right hip arthritis.   PROCEDURE: Right total hip arthroplasty, anterior approach.   IMPLANTS: DePuy Tri Lock stem, size 5, std offset. DePuy Pinnacle Cup, size 50 mm. DePuy Altrx liner, size 32 by 50 mm, neutral. DePuy Biolox ceramic head ball, size 32 + 1 mm.  ANESTHESIA:  Spinal  ESTIMATED BLOOD LOSS:-450 mL    ANTIBIOTICS: 3 g Ancef.  DRAINS: None.  COMPLICATIONS: None.   CONDITION: PACU - hemodynamically stable.   BRIEF CLINICAL NOTE: Stephanie Franco is a 67 y.o. female with a long-standing history of Right hip arthritis. After failing conservative management, the patient was indicated for total hip arthroplasty. The risks, benefits, and alternatives to the procedure were explained, and the patient elected to proceed.  PROCEDURE IN DETAIL: Surgical site was marked by myself in the pre-op holding area. Once inside the operating room, spinal anesthesia was obtained, and a foley catheter was inserted. The patient was then positioned on the Hana table. All bony prominences were well padded. The hip was prepped and draped in the normal sterile surgical fashion. A time-out was called verifying side and site of surgery. The patient received IV antibiotics within 60 minutes of beginning the procedure.  The direct anterior approach to the hip was performed through the Hueter interval. Lateral femoral circumflex vessels were treated with the Auqumantys. The anterior capsule was exposed and an inverted T capsulotomy was made.The femoral neck cut was made to the level of the templated cut. A corkscrew was placed into the head and the head was removed. The femoral head was found to have eburnated bone. The head was passed to the back table and was measured.  Acetabular exposure was achieved, and the pulvinar and labrum  were excised. Sequential reaming of the acetabulum was then performed up to a size 49 mm reamer. A 50 mm cup was then opened and impacted into place at approximately 40 degrees of abduction and 20 degrees of anteversion. The final polyethylene liner was impacted into place and acetabular osteophytes were removed.   I then gained femoral exposure taking care to protect the abductors and greater trochanter. This was performed using standard external rotation, extension, and adduction. The capsule was peeled off the inner aspect of the greater trochanter, taking care to preserve the short external rotators. A cookie cutter was used to enter the femoral canal, and then the femoral canal finder was placed. Sequential broaching was performed up to a size 5. Calcar planer was used on the femoral neck remnant. I placed a std offset neck and a trial head ball. The hip was reduced. Leg lengths and offset were checked fluoroscopically. The hip was dislocated and trial components were removed. The final implants were placed, and the hip was reduced.  Fluoroscopy was used to confirm component position and leg lengths. At 90 degrees of external rotation and full extension, the hip was stable to an anterior directed force.  The wound was copiously irrigated with normal saline using pulse lavage. Marcaine solution was injected into the periarticular soft tissue. The wound was closed in layers using #1 Vicryl and V-Loc for the fascia, 2-0 Vicryl for the subcutaneous fat, 2-0 Monocryl for the deep dermal layer, and 2-0 nylon vertical mattress sutures for the skin. Prevena wound VAC was applied according to manufacturer's instructions with excellent seal. The patient was transported to the recovery room in  stable condition. Sponge, needle, and instrument counts were correct at the end of the case x2. The patient tolerated the procedure well and there were no known complications.  Please note that a surgical  assistant was a medical necessity for this procedure to perform it in a safe and expeditious manner. Assistant was necessary to provide appropriate retraction of vital neurovascular structures, to prevent femoral fracture, and to allow for anatomic placement of the prosthesis.

## 2018-01-19 NOTE — Anesthesia Preprocedure Evaluation (Signed)
Anesthesia Evaluation  Patient identified by MRN, date of birth, ID band Patient awake    Reviewed: Allergy & Precautions, NPO status , Patient's Chart, lab work & pertinent test results  Airway Mallampati: II  TM Distance: <3 FB Neck ROM: Full    Dental no notable dental hx.    Pulmonary sleep apnea and Continuous Positive Airway Pressure Ventilation , former smoker,  Severe pulmonary hypertension   breath sounds clear to auscultation + decreased breath sounds      Cardiovascular hypertension, + CAD and + Cardiac Stents  Normal cardiovascular exam Rhythm:Regular Rate:Normal  Left ventricle: Basal inferior hypokinesis/akinesis The cavity   size was normal. Wall thickness was increased in a pattern of   mild LVH. Systolic function was mildly reduced. The estimated   ejection fraction was in the range of 45% to 50%. - Aortic valve: There was trivial regurgitation. - Left atrium: The atrium was moderately dilated. - Right ventricle: The cavity size was mildly dilated. Systolic   function was mildly to moderately reduced. - Right atrium: The atrium was severely dilated. - Pulmonary arteries: PA peak pressure: 90 mm Hg (S). - Pericardium, extracardiac: A trivial pericardial effusion was   identified.  Impressions:  - Compared to report from previuos echo, LVEF is improved, RV   function is down and PAP is increased. Poor acoustic windows.   Difficult to see endocardium  ------------------------------------------------------------------- Study data:  Comparison was made to the study of 02/05/2015.  Study status:  Routine.  Procedure:  The patient reported no pain pre or post test. Transthoracic echocardiography. Image quality was adequate.  Study completion:  There were no complications. Transthoracic echocardiography.  M-mode, complete 2D, spectral Doppler, and color Doppler.  Birthdate:  Patient birthdate: [birth year].  Age:   Patient is 67 yr old.  Sex:  Gender: female. BMI: 40.6 kg/m^2.  Blood pressure:     130/78  Patient status: Outpatient.  Study date:  Study date: 12/17/2017. Study time: 09:25 AM.  Location:  [facility]  -------------------------------------------------------------------  ------------------------------------------------------------------- Left ventricle:  Basal inferior hypokinesis/akinesis Basal inferior hypokinesis. The cavity size was normal. Wall thickness was increased in a pattern of mild LVH. Systolic function was mildly reduced. The estimated ejection fraction was in the range of 45% to 50%.   Neuro/Psych negative neurological ROS  negative psych ROS   GI/Hepatic negative GI ROS, Neg liver ROS,   Endo/Other  diabetesMorbid obesity  Renal/GU negative Renal ROS  negative genitourinary   Musculoskeletal negative musculoskeletal ROS (+)   Abdominal (+) + obese,   Peds negative pediatric ROS (+)  Hematology negative hematology ROS (+)   Anesthesia Other Findings   Reproductive/Obstetrics negative OB ROS                             Anesthesia Physical Anesthesia Plan  ASA: IV  Anesthesia Plan: Spinal   Post-op Pain Management:    Induction: Intravenous  PONV Risk Score and Plan: 2 and Ondansetron, Dexamethasone and Treatment may vary due to age or medical condition  Airway Management Planned: Simple Face Mask  Additional Equipment:   Intra-op Plan:   Post-operative Plan:   Informed Consent: I have reviewed the patients History and Physical, chart, labs and discussed the procedure including the risks, benefits and alternatives for the proposed anesthesia with the patient or authorized representative who has indicated his/her understanding and acceptance.   Dental advisory given  Plan Discussed with: CRNA  and Surgeon  Anesthesia Plan Comments:         Anesthesia Quick Evaluation

## 2018-01-19 NOTE — Discharge Instructions (Signed)
Dr. Rod Can Joint Replacement Specialist [facility] [address] [place], [place] [address] [phone]   TOTAL HIP REPLACEMENT POSTOPERATIVE DIRECTIONS    Hip Rehabilitation, Guidelines Following Surgery   WEIGHT BEARING Weight bearing as tolerated with assist device (walker, cane, etc) as directed, use it as long as suggested by your surgeon or therapist, typically at least 4-6 weeks.  The results of a hip operation are greatly improved after range of motion and muscle strengthening exercises. Follow all safety measures which are given to protect your hip. If any of these exercises cause increased pain or swelling in your joint, decrease the amount until you are comfortable again. Then slowly increase the exercises. Call your caregiver if you have problems or questions.   HOME CARE INSTRUCTIONS  Most of the following instructions are designed to prevent the dislocation of your new hip.  Remove items at home which could result in a fall. This includes throw rugs or furniture in walking pathways.  Continue medications as instructed at time of discharge.  You may have some home medications which will be placed on hold until you complete the course of blood thinner medication.  Keep dressing clean and dry. Do not remove your dressing. Do not put on socks or shoes without following the instructions of your caregivers.   Sit on chairs with arms. Use the chair arms to help push yourself up when arising.  Arrange for the use of a toilet seat elevator so you are not sitting low.   Walk with walker as instructed.  You may resume a sexual relationship in one month or when given the OK by your caregiver.  Use walker as long as suggested by your caregivers.  You may put full weight on your legs and walk as much as is comfortable. Avoid periods of inactivity such as sitting longer than an hour when not asleep. This helps prevent blood clots.  You may  return to work once you are cleared by Engineer, production.  Do not drive a car for 6 weeks or until released by your surgeon.  Do not drive while taking narcotics.  Wear elastic stockings for two weeks following surgery during the day but you may remove then at night.  Make sure you keep all of your appointments after your operation with all of your doctors and caregivers. You should call the office at the above phone number and make an appointment for approximately two weeks after the date of your surgery. Please pick up a stool softener and laxative for home use as long as you are requiring pain medications.  ICE to the affected hip every three hours for 30 minutes at a time and then as needed for pain and swelling. Continue to use ice on the hip for pain and swelling from surgery. You may notice swelling that will progress down to the foot and ankle.  This is normal after surgery.  Elevate the leg when you are not up walking on it.   It is important for you to complete the blood thinner medication as prescribed by your doctor.  Continue to use the breathing machine which will help keep your temperature down.  It is common for your temperature to cycle up and down following surgery, especially at night when you are not up moving around and exerting yourself.  The breathing machine keeps your lungs expanded and your temperature down.  RANGE OF MOTION AND STRENGTHENING EXERCISES  These exercises are designed to help  you keep full movement of your hip joint. Follow your caregiver's or physical therapist's instructions. Perform all exercises about fifteen times, three times per day or as directed. Exercise both hips, even if you have had only one joint replacement. These exercises can be done on a training (exercise) mat, on the floor, on a table or on a bed. Use whatever works the best and is most comfortable for you. Use music or television while you are exercising so that the exercises are a pleasant break  in your day. This will make your life better with the exercises acting as a break in routine you can look forward to.  Lying on your back, slowly slide your foot toward your buttocks, raising your knee up off the floor. Then slowly slide your foot back down until your leg is straight again.  Lying on your back spread your legs as far apart as you can without causing discomfort.  Lying on your side, raise your upper leg and foot straight up from the floor as far as is comfortable. Slowly lower the leg and repeat.  Lying on your back, tighten up the muscle in the front of your thigh (quadriceps muscles). You can do this by keeping your leg straight and trying to raise your heel off the floor. This helps strengthen the largest muscle supporting your knee.  Lying on your back, tighten up the muscles of your buttocks both with the legs straight and with the knee bent at a comfortable angle while keeping your heel on the floor.   SKILLED REHAB INSTRUCTIONS: If the patient is transferred to a skilled rehab facility following release from the hospital, a list of the current medications will be sent to the facility for the patient to continue.  When discharged from the skilled rehab facility, please have the facility set up the patient's [place] prior to being released. Also, the skilled facility will be responsible for providing the patient with their medications at time of release from the facility to include their pain medication and their blood thinner medication. If the patient is still at the rehab facility at time of the two week follow up appointment, the skilled rehab facility will also need to assist the patient in arranging follow up appointment in our office and any transportation needs.  MAKE SURE YOU:  Understand these instructions.  Will watch your condition.  Will get help right away if you are not doing well or get worse.  Pick up stool softner and laxative for home use  following surgery while on pain medications. Keep VAC dressing clean and dry. Do not remove. Charge VAC unit nightly. Continue to use ice for pain and swelling after surgery. Do not use any lotions or creams on the incision until instructed by your surgeon. Total Hip Protocol.

## 2018-01-19 NOTE — Anesthesia Postprocedure Evaluation (Signed)
Anesthesia Post Note  Patient: Stephanie Franco  Procedure(s) Performed: RIGHT TOTAL HIP ARTHROPLASTY ANTERIOR APPROACH (Right Hip)     Patient location during evaluation: PACU Anesthesia Type: Spinal Level of consciousness: oriented and awake and alert Pain management: pain level controlled Vital Signs Assessment: post-procedure vital signs reviewed and stable Respiratory status: spontaneous breathing, respiratory function stable and patient connected to nasal cannula oxygen Cardiovascular status: blood pressure returned to baseline and stable Postop Assessment: no headache, no backache and no apparent nausea or vomiting Anesthetic complications: no    Last Vitals:  Vitals:   01/19/18 1420 01/19/18 1522  BP: 131/75 (!) 155/82  Pulse: (!) 57 61  Resp:    Temp: 36.6 C 36.7 C  SpO2: 95% 100%    Last Pain:  Vitals:   01/19/18 1522  TempSrc: Oral  PainSc:                  [name] S

## 2018-01-19 NOTE — Plan of Care (Signed)
  Problem: Health Behavior/Discharge Planning: Goal: Ability to manage health-related needs will improve Outcome: Progressing   Problem: Clinical Measurements: Goal: Ability to maintain clinical measurements within normal limits will improve Outcome: Progressing   Problem: Clinical Measurements: Goal: Diagnostic test results will improve Outcome: Progressing   Problem: Clinical Measurements: Goal: Respiratory complications will improve Outcome: Progressing   Problem: Clinical Measurements: Goal: Respiratory complications will improve Outcome: Progressing   Problem: Clinical Measurements: Goal: Respiratory complications will improve Outcome: Progressing

## 2018-01-19 NOTE — Progress Notes (Signed)
Pt. set up with CPAP per order, has own nasal pillows, currently on room air, Auto set per home, made aware to notify if placement help needed, Husband remains at bedside.

## 2018-01-19 NOTE — Care Plan (Signed)
R THA scheduled on 01-19-18 DCP:  Home with spouse.  1 story home with 0 ste DME:  No needs PT:  HEP

## 2018-01-19 NOTE — Interval H&P Note (Signed)
History and Physical Interval Note:  01/19/2018 7:44 AM  [name]  has presented today for surgery, with the diagnosis of Degenerative joint disease right hip  The various methods of treatment have been discussed with the patient and family. After consideration of risks, benefits and other options for treatment, the patient has consented to  Procedure(s) with comments: RIGHT TOTAL HIP ARTHROPLASTY ANTERIOR APPROACH (Right) - Needs RNFA as a surgical intervention .  The patient's history has been reviewed, patient examined, no change in status, stable for surgery.  I have reviewed the patient's chart and labs.  Questions were answered to the patient's satisfaction.    The risks, benefits, and alternatives were discussed with the patient. There are risks associated with the surgery including, but not limited to, problems with anesthesia (death), infection, instability (giving out of the joint), dislocation, differences in leg length/angulation/rotation, fracture of bones, loosening or failure of implants, hematoma (blood accumulation) which may require surgical drainage, blood clots, pulmonary embolism, nerve injury (foot drop and lateral thigh numbness), and blood vessel injury. The patient understands these risks and elects to proceed.    Hilton Cork Chia Mowers

## 2018-01-20 ENCOUNTER — Encounter (HOSPITAL_COMMUNITY): Payer: Self-pay | Admitting: Orthopedic Surgery

## 2018-01-20 LAB — BASIC METABOLIC PANEL
Anion gap: 6 (ref 5–15)
BUN: 24 mg/dL — AB (ref 8–23)
CALCIUM: 8.7 mg/dL — AB (ref 8.9–10.3)
CO2: 25 mmol/L (ref 22–32)
CREATININE: 1.1 mg/dL — AB (ref 0.44–1.00)
Chloride: 111 mmol/L (ref 98–111)
GFR calc non Af Amer: 50 mL/min — ABNORMAL LOW (ref >60)
GFR, EST AFRICAN AMERICAN: 58 mL/min — AB (ref >60)
Glucose, Bld: 134 mg/dL — ABNORMAL HIGH (ref 70–99)
Potassium: 4.5 mmol/L (ref 3.5–5.1)
SODIUM: 142 mmol/L (ref 135–145)

## 2018-01-20 LAB — CBC
HCT: 25.2 % — ABNORMAL LOW (ref 36.0–46.0)
Hemoglobin: 8.2 g/dL — ABNORMAL LOW (ref 12.0–15.0)
MCH: 29.2 pg (ref 26.0–34.0)
MCHC: 32.5 g/dL (ref 30.0–36.0)
MCV: 89.7 fL (ref 78.0–100.0)
Platelets: 130 10*3/uL — ABNORMAL LOW (ref 150–400)
RBC: 2.81 MIL/uL — ABNORMAL LOW (ref 3.87–5.11)
RDW: 17.6 % — AB (ref 11.5–15.5)
WBC: 9.6 10*3/uL (ref 4.0–10.5)

## 2018-01-20 LAB — GLUCOSE, CAPILLARY
GLUCOSE-CAPILLARY: 114 mg/dL — AB (ref 70–99)
Glucose-Capillary: 121 mg/dL — ABNORMAL HIGH (ref 70–99)

## 2018-01-20 MED ORDER — ONDANSETRON HCL 4 MG PO TABS: 4.0000 mg | ORAL_TABLET | Freq: Four times a day (QID) | ORAL | 0 refills | Status: DC | PRN

## 2018-01-20 MED ORDER — SENNA 8.6 MG PO TABS: 1.0000 | ORAL_TABLET | Freq: Two times a day (BID) | ORAL | 0 refills | Status: DC

## 2018-01-20 MED ORDER — HYDROCODONE-ACETAMINOPHEN 5-325 MG PO TABS: 1.0000 | ORAL_TABLET | Freq: Four times a day (QID) | ORAL | 0 refills | Status: DC | PRN

## 2018-01-20 MED ORDER — DOCUSATE SODIUM 100 MG PO CAPS: 100.0000 mg | ORAL_CAPSULE | Freq: Two times a day (BID) | ORAL | 1 refills | Status: DC

## 2018-01-20 NOTE — Discharge Summary (Signed)
Physician Discharge Summary  Patient ID: Stephanie Franco MRN: [id number] DOB/AGE: 1950-10-27 67 y.o.  Admit date: 01/19/2018 Discharge date: 01/20/2018  Admission Diagnoses:  Primary osteoarthritis of right hip  Discharge Diagnoses:  Principal Problem:   Primary osteoarthritis of right hip Active Problems:   Osteoarthritis of right hip   Past Medical History:  Diagnosis Date  . Aortic atherosclerosis ([place])   . Arthritis   . Asthma   . Atrial fibrillation, permanent ([place])    Rate control with Bystolic. CHA2DS2Vasc = 6 (HTN, DM, CHF, Age 78, Female) -> on Pradaxa  . Breast cancer ([place]) 2008   S/P mastectomy  . CAD S/P percutaneous coronary angioplasty 2006;    PCI of circumflex with Taxus DES;; relook-cath Feb 2013: 50-60% short lesion in RCA, 40% ISR Circumflex stent.  . Cardiomegaly   . CKD (chronic kidney disease), stage III ([place])   . Complication of anesthesia    prolonged sedation after colonoscopy in [place]  . Diabetes mellitus, uncontrolled 07/14/2011  . Dilated cardiomyopathy ([place])    Mostly Resolved (EF up from ~25% to ~45% by Echo)  . DOE (dyspnea on exertion)   . Edema of both legs    Usually mild, chronic. Controlled with when necessary furosemide and diet  . 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 GERD (gastroesophageal reflux disease)   . Gout   . Hyperlipidemia   . Hypertension   . Hypokalemia   . Morbid obesity ([facility])    BMI 41  . OSA on CPAP    On CPAP  . Pulmonary hypertension, unspecified ([place])    PAP ~90 mmHg on Echo 12/2017 -- has OSA on CPAP & obesity - but Overall Improved Sx of dyspnea / edema  . Systolic murmur     Surgeries: Procedure(s): RIGHT TOTAL HIP ARTHROPLASTY ANTERIOR APPROACH on 01/19/2018   Consultants (if any):   Discharged Condition: Improved  Hospital Course: Stephanie Franco is an 67 y.o. female who was admitted 01/19/2018 with a diagnosis of Primary osteoarthritis of right hip and went to the operating room on 01/19/2018 and  underwent the above named procedures.    She was given perioperative antibiotics:  Anti-infectives (From admission, onward)   Start     Dose/Rate Route Frequency Ordered Stop   01/19/18 1500  ceFAZolin (ANCEF) IVPB 2g/100 mL premix     2 g 200 mL/hr over 30 Minutes Intravenous Every 6 hours 01/19/18 1429 01/19/18 2036   01/19/18 0700  ceFAZolin (ANCEF) IVPB 2g/100 mL premix     2 g 200 mL/hr over 30 Minutes Intravenous On call to O.R. 01/19/18 7616 01/19/18 0737    .  She was given sequential compression devices, early ambulation, and apixaban for DVT prophylaxis.  She benefited maximally from the hospital stay and there were no complications.    Recent vital signs:  Vitals:   01/19/18 2258 01/20/18 0144  BP: 106/81 124/63  Pulse: (!) 55 64  Resp: 17 16  Temp: 98 F (36.7 C) 97.6 F (36.4 C)  SpO2: 97% 99%    Recent laboratory studies:  Lab Results  Component Value Date   HGB 8.2 (L) 01/20/2018   HGB 11.5 (L) 01/11/2018   HGB 11.9 (L) 02/20/2017   Lab Results  Component Value Date   WBC 9.6 01/20/2018   PLT 130 (L) 01/20/2018   Lab Results  Component Value Date   INR 1.36 10/31/2014   Lab Results  Component Value Date   NA 142  01/20/2018   K 4.5 01/20/2018   CL 111 01/20/2018   CO2 25 01/20/2018   BUN 24 (H) 01/20/2018   CREATININE 1.10 (H) 01/20/2018   GLUCOSE 134 (H) 01/20/2018    Discharge Medications:   Allergies as of 01/20/2018   No Known Allergies     Medication List    TAKE these medications   ACCU-CHEK SMARTVIEW test strip Generic drug:  glucose blood 1 each by Other route as needed for other. Use as instructed   albuterol 108 (90 Base) MCG/ACT inhaler Commonly known as:  PROVENTIL HFA;VENTOLIN HFA Inhale 1-2 puffs into the lungs every 6 (six) hours as needed for wheezing or shortness of breath.   albuterol (2.5 MG/3ML) 0.083% nebulizer solution Commonly known as:  PROVENTIL Take 3 mLs (2.5 mg total) by nebulization every 6 (six) hours  as needed for wheezing or shortness of breath.   allopurinol 300 MG tablet Commonly known as:  ZYLOPRIM Take 300 mg by mouth daily with supper.   atorvastatin 40 MG tablet Commonly known as:  LIPITOR Take 1 tablet (40 mg total) by mouth daily. What changed:    when to take this  additional instructions   colchicine 0.6 MG tablet Take 0.6 mg by mouth every evening.   docusate sodium 100 MG capsule Commonly known as:  COLACE Take 1 capsule (100 mg total) by mouth 2 (two) times daily.   ELIQUIS 5 MG Tabs tablet Generic drug:  apixaban Take 1 tablet (5 mg total) by mouth 2 (two) times daily.   ezetimibe 10 MG tablet Commonly known as:  ZETIA Take 1 tablet (10 mg total) by mouth daily. What changed:  when to take this   ferrous sulfate 325 (65 FE) MG EC tablet Take 325 mg by mouth daily with breakfast.   fexofenadine 180 MG tablet Commonly known as:  ALLEGRA Take 180 mg by mouth daily as needed for allergies.   fluticasone 50 MCG/ACT nasal spray Commonly known as:  FLONASE Place 2 sprays into the nose daily as needed for allergies or rhinitis.   furosemide 80 MG tablet Commonly known as:  LASIX Take 1 tablet (80 mg total) by mouth 2 (two) times daily. What changed:  additional instructions   HYDROcodone-acetaminophen 5-325 MG tablet Commonly known as:  NORCO/VICODIN Take 1-2 tablets by mouth every 6 (six) hours as needed (postop hip pain).   irbesartan 300 MG tablet Commonly known as:  AVAPRO Take 1 tablet (300 mg total) by mouth daily. What changed:  when to take this   isosorbide mononitrate 60 MG 24 hr tablet Commonly known as:  IMDUR Take 1 tablet (60 mg total) by mouth daily.   lidocaine 5 % Commonly known as:  LIDODERM Place 1 patch onto the skin daily as needed (for hip pain).   LORazepam 1 MG tablet Commonly known as:  ATIVAN Take 1 mg by mouth 2 (two) times daily. In the evening with supper & at bedtime   multivitamin with minerals tablet Take  1 tablet by mouth daily.   nebivolol 10 MG tablet Commonly known as:  BYSTOLIC Take 1 tablet (10 mg total) by mouth daily.   nitroGLYCERIN 0.4 MG SL tablet Commonly known as:  NITROSTAT DISSOLVE 1 TABLET (0.4 MG TOTAL) UNDER THE TONGUE EVERY 5 (FIVE) MINUTES AS NEEDED FOR CHEST PAIN.   ondansetron 4 MG tablet Commonly known as:  ZOFRAN Take 1 tablet (4 mg total) by mouth every 6 (six) hours as needed for nausea.   pantoprazole  40 MG tablet Commonly known as:  PROTONIX Take 1 tablet (40 mg total) by mouth daily.   potassium chloride SA 20 MEQ tablet Commonly known as:  K-DUR,KLOR-CON Take 1 tablet (20 mEq total) by mouth 2 (two) times daily. What changed:  additional instructions   senna 8.6 MG Tabs tablet Commonly known as:  SENOKOT Take 1 tablet (8.6 mg total) by mouth 2 (two) times daily.   TOUJEO SOLOSTAR 300 UNIT/ML Sopn Generic drug:  Insulin Glargine Inject 28 Units into the skin daily with supper.       Diagnostic Studies: Dg Pelvis Portable  Result Date: 01/19/2018 CLINICAL DATA:  Postop day 0 ANTERIOR approach RIGHT total hip arthroplasty. EXAM: PORTABLE PELVIS 1-2 VIEWS 12:02 p.m.: COMPARISON:  Intraoperative images obtained earlier same day at 9:21 a.m. through 10:42 a.m. FINDINGS: Anatomic alignment of the RIGHT hip prosthesis in the AP projection. No acute complicating features. Note is made of moderate to severe joint space narrowing involving the contralateral LEFT hip. IMPRESSION: Anatomic alignment of the RIGHT hip prosthesis in the AP projection without acute complicating features. Electronically Signed   By: Evangeline Dakin M.D.   On: 01/19/2018 12:34   Dg C-arm 1-60 Min-no Report  Result Date: 01/19/2018 Fluoroscopy was utilized by the requesting physician.  No radiographic interpretation.   Dg Hip Operative Unilat W Or W/o Pelvis Right  Result Date: 01/19/2018 CLINICAL DATA:  Multiple fluoro spot images from right total hip arthroplasty. EXAM: OPERATIVE  right HIP (WITH PELVIS IF PERFORMED) 5 VIEWS TECHNIQUE: Fluoroscopic spot image(s) were submitted for interpretation post-operatively. Reported fluoro time is 27.6 seconds COMPARISON:  Right hip series of December 20, 2015 FINDINGS: The patient has undergone placement of a total right hip joint prosthesis. Radiographic positioning of the prosthetic components is good. The interface with the native bone appears normal. IMPRESSION: There is no immediate postprocedure complication following right total hip arthroplasty. Electronically Signed   By: David  Martinique M.D.   On: 01/19/2018 13:13    Disposition: Discharge disposition: 01-Home or Self Care       Discharge Instructions    Call MD / Call 911   Complete by:  As directed    If you experience chest pain or shortness of breath, CALL 911 and be transported to the hospital emergency room.  If you develope a fever above 101 F, pus (white drainage) or increased drainage or redness at the wound, or calf pain, call your surgeon's office.   Constipation Prevention   Complete by:  As directed    Drink plenty of fluids.  Prune juice may be helpful.  You may use a stool softener, such as Colace (over the counter) 100 mg twice a day.  Use MiraLax (over the counter) for constipation as needed.   Diet - low sodium heart healthy   Complete by:  As directed    Driving restrictions   Complete by:  As directed    No driving for 6 weeks   Increase activity slowly as tolerated   Complete by:  As directed    Lifting restrictions   Complete by:  As directed    No lifting for 6 weeks   TED hose   Complete by:  As directed    Use stockings (TED hose) for 2 weeks on both leg(s).  You may remove them at night for sleeping.      Follow-up Information    Shantrice Rodenberg, Aaron Edelman, MD. Schedule an appointment as soon as possible for a visit on  01/25/2018.   Specialty:  Orthopedic Surgery Why:  [facility] removal Contact information: [address] [place]  [address] [phone]            Signed: Hilton Cork Shalon Salado 01/20/2018, 10:12 AM

## 2018-01-20 NOTE — Progress Notes (Signed)
Physical Therapy Treatment Patient Details Name: Stephanie Franco MRN: [id number] DOB: [birth year] Today's Date: 01/20/2018    History of Present Illness Pt is a 66 YO female s/p R DA-THA on 01/18/18. PMH includes pulmonary HTN, congestive dilated cardiomyopathy essentially resolved, CHF, hypokalemia, pyelonephritis, obesity, leg cramping, DOE, afib, DM II, OSA, HTN, atherosclerosis, breast cancer and mastectomy 2008, coronary angioplasty with stent 2006, cardiomegaly, CKD III, HLD.    PT Comments    POD # 1  Assisted with amb a greater distance and performed all THr TE's following HEP handout.  Instructed on proper tech, freq as well as use of ICE.  Pt has met goals to safely D/C to home.   Follow Up Recommendations  Follow surgeon's recommendation for DC plan and follow-up therapies(HEP)     Equipment Recommendations  None recommended by PT    Recommendations for Other Services       Precautions / Restrictions Precautions Precautions: Fall Restrictions Weight Bearing Restrictions: No Other Position/Activity Restrictions: WBAT     Mobility  Bed Mobility               General bed mobility comments: OOB in recliner  Transfers Overall transfer level: Needs assistance Equipment used: Rolling walker (2 wheeled) Transfers: Sit to/from Stand Sit to Stand: Supervision;Min guard         General transfer comment: one vc safety with turns  Ambulation/Gait Ambulation/Gait assistance: Supervision;Min guard Gait Distance (Feet): 85 Feet Assistive device: Rolling walker (2 wheeled) Gait Pattern/deviations: Step-to pattern;Decreased stride length;Decreased stance time - right;Decreased weight shift to right;Antalgic;Trunk flexed Gait velocity: decreased   General Gait Details: one VC safety with turns   Stairs Stairs: (no stairs)           Wheelchair Mobility    Modified Rankin (Stroke Patients Only)       Balance                                             Cognition Arousal/Alertness: Awake/alert Behavior During Therapy: WFL for tasks assessed/performed Overall Cognitive Status: Within Functional Limits for tasks assessed                                        Exercises   Total Hip Replacement TE's 10 reps ankle pumps 10 reps knee presses 10 reps heel slides 10 reps SAQ's 10 reps ABD Followed by ICE     General Comments        Pertinent Vitals/Pain Pain Assessment: 0-10 Pain Score: 3  Pain Location: R hip  Pain Descriptors / Indicators: Aching;Sore;Tender Pain Intervention(s): Monitored during session;Premedicated before session;Repositioned;Ice applied    Home Living                      Prior Function            PT Goals (current goals can now be found in the care plan section) Progress towards PT goals: Progressing toward goals    Frequency    7X/week      PT Plan Current plan remains appropriate    Co-evaluation              AM-PAC PT "6 Clicks" Daily Activity  Outcome Measure  Difficulty turning over in bed (including adjusting bedclothes,  sheets and blankets)?: A Little Difficulty moving from lying on back to sitting on the side of the bed? : A Little Difficulty sitting down on and standing up from a chair with arms (e.g., wheelchair, bedside commode, etc,.)?: A Little Help needed moving to and from a bed to chair (including a wheelchair)?: A Little Help needed walking in hospital room?: A Little Help needed climbing 3-5 steps with a railing? : A Little 6 Click Score: 18    End of Session Equipment Utilized During Treatment: Gait belt Activity Tolerance: Patient tolerated treatment well;Patient limited by lethargy Patient left: in chair;with chair alarm set;with call bell/phone within reach;with family/visitor present Nurse Communication: (pt ready for D/C to home) PT Visit Diagnosis: Difficulty in walking, not elsewhere classified (R26.2);Other  abnormalities of gait and mobility (R26.89)     Time: 9437-1907 PT Time Calculation (min) (ACUTE ONLY): 30 min  Charges:  $Gait Training: 8-22 mins $Therapeutic Exercise: 8-22 mins                     Rica Koyanagi  PTA [facility]  Acute  Rehab Pager      [phone]

## 2018-01-20 NOTE — Progress Notes (Addendum)
    Subjective:  Patient reports pain as mild to moderate.  Denies N/V/CP/SOB. No c/o.  Objective:   VITALS:   Vitals:   01/19/18 1722 01/19/18 1746 01/19/18 2258 01/20/18 0144  BP: 127/64 127/67 106/81 124/63  Pulse: (!) 112 60 (!) 55 64  Resp:   17 16  Temp: 97.6 F (36.4 C) 98 F (36.7 C) 98 F (36.7 C) 97.6 F (36.4 C)  TempSrc: Oral Oral Oral Oral  SpO2: 100% 100% 97% 99%  Weight:      Height:        NAD ABD soft Sensation intact distally Intact pulses distally Dorsiflexion/Plantar flexion intact Incision: dressing C/D/I Compartment soft VAC intact  Lab Results  Component Value Date   WBC 9.6 01/20/2018   HGB 8.2 (L) 01/20/2018   HCT 25.2 (L) 01/20/2018   MCV 89.7 01/20/2018   PLT 130 (L) 01/20/2018   BMET    Component Value Date/Time   NA 142 01/20/2018 0526   NA 144 07/06/2017 0924   K 4.5 01/20/2018 0526   CL 111 01/20/2018 0526   CO2 25 01/20/2018 0526   GLUCOSE 134 (H) 01/20/2018 0526   BUN 24 (H) 01/20/2018 0526   BUN 28 (H) 07/06/2017 0924   CREATININE 1.10 (H) 01/20/2018 0526   CREATININE 1.45 (H) 05/19/2017 0815   CALCIUM 8.7 (L) 01/20/2018 0526   GFRNONAA 50 (L) 01/20/2018 0526   GFRNONAA 37 (L) 05/19/2017 0815   GFRAA 58 (L) 01/20/2018 0526   GFRAA 43 (L) 05/19/2017 0815     Assessment/Plan: 1 Day Post-Op   Principal Problem:   Primary osteoarthritis of right hip Active Problems:   Osteoarthritis of right hip   WBAT with walker DVT ppx: apixaban, SCDs, TEDS PO pain control PT/OT Dispo: D/C home with HEP, convert to portable prevena unit upon d/c   Hilton Cork Demetrios Byron 01/20/2018, 10:07 AM   Rod Can, MD Cell [phone]

## 2018-02-02 ENCOUNTER — Telehealth: Payer: Self-pay | Admitting: Cardiology

## 2018-02-02 MED ORDER — VALSARTAN 320 MG PO TABS: 320.0000 mg | ORAL_TABLET | Freq: Every day | ORAL | 3 refills | Status: DC

## 2018-02-02 NOTE — Telephone Encounter (Signed)
New Message:    Pt c/o medication issue:  1. Name of Medication: allopurinol (ZYLOPRIM) 300 MG tablet  2. What is your medication issue? Patient states its been 2 weeks and the pharmacy has not been able to fill the medication due to manufacture issue. Requesting a replacement medication.

## 2018-02-02 NOTE — Telephone Encounter (Signed)
Spoke with pt and advised of Pharm D's  Recommendation. Pt verbalized understanding. Orders placed.

## 2018-02-02 NOTE — Telephone Encounter (Signed)
We haven't had issues with valsartan backorders - switch her to Valsartan 320 mg once daily.  If she can, should check home BP readings 3-4 times per week (or check at pharmacies).

## 2018-02-02 NOTE — Telephone Encounter (Signed)
Spoke with pt who states she has been out of her Irbesartan for 2 weeks due to medication being on back order. Pt requesting for something she can take in replace of it. Routing to Vena Austria and MD.

## 2018-02-04 ENCOUNTER — Other Ambulatory Visit: Payer: Self-pay

## 2018-02-04 NOTE — Telephone Encounter (Signed)
Agree  DH

## 2018-02-08 ENCOUNTER — Telehealth: Payer: Self-pay | Admitting: Cardiology

## 2018-02-08 MED ORDER — LISINOPRIL 40 MG PO TABS: 40.0000 mg | ORAL_TABLET | Freq: Every day | ORAL | 3 refills | Status: DC

## 2018-02-08 NOTE — Telephone Encounter (Signed)
Please advise, Thanks!  

## 2018-02-08 NOTE — Telephone Encounter (Signed)
Patient was concerned as she thought there was another recall on valsartan.  Is tired of worrying about the cancer risk.  Would like to switch to another class of medication.   Quick chart review shows she has CHF and is on furosemide 80 mg bid, so amlodipine not best choice right now.  Will start her on lisinopril 40 mg to replace valsartan and have her follow up in HTN clinic about 3 weeks after starting.  Patient agreeable to plan, would like to finish out her 3 remaining weeks of valsartan, so we will see her in 5-6 weeks.

## 2018-02-08 NOTE — Telephone Encounter (Signed)
New Message    Pt c/o medication issue:  1. Name of Medication:   valsartan (DIOVAN) 320 MG tablet     2. How are you currently taking this medication (dosage and times per day)?   3. Are you having a reaction (difficulty breathing--STAT)?   4. What is your medication issue? Patient is calling because she is saying that it a recall on this prescription and she wants to know what she needs to take as its replacement. Please call.

## 2018-03-01 ENCOUNTER — Telehealth: Payer: Self-pay | Admitting: *Deleted

## 2018-03-01 NOTE — Telephone Encounter (Signed)
Called to schedule Hypertension Clinic visit----patient states her son just passed away and she will call later to schedule.

## 2018-03-09 ENCOUNTER — Emergency Department: Admit: 2018-03-09 | Payer: MEDICARE | Primary: Family Medicine

## 2018-03-09 ENCOUNTER — Inpatient Hospital Stay
Admission: EM | Admit: 2018-03-09 | Discharge: 2018-03-14 | Disposition: A | Discharge status: Home / Self Care | Payer: Medicare Other | Source: Other Acute Inpatient Hospital | Admitting: Internal Medicine

## 2018-03-09 DIAGNOSIS — J101 Influenza due to other identified influenza virus with other respiratory manifestations: Secondary | ICD-10-CM

## 2018-03-09 LAB — COMPREHENSIVE METABOLIC PANEL
ALT: 36 U/L (ref 10–40)
AST: 73 U/L — ABNORMAL HIGH (ref 15–37)
Albumin/Globulin Ratio: 1.1 (ref 1.1–2.2)
Albumin: 3.6 g/dL (ref 3.4–5.0)
Alkaline Phosphatase: 177 U/L — ABNORMAL HIGH (ref 40–129)
Anion Gap: 13 (ref 3–16)
BUN: 26 mg/dL — ABNORMAL HIGH (ref 7–20)
CO2: 27 mmol/L (ref 21–32)
Calcium: 9.2 mg/dL (ref 8.3–10.6)
Chloride: 106 mmol/L (ref 99–110)
Creatinine: 1 mg/dL (ref 0.6–1.2)
GFR African American: >60 (ref >60)
GFR Non-African American: 55 — AB (ref >60)
Globulin: 3.3 g/dL
Glucose: 159 mg/dL — ABNORMAL HIGH (ref 70–99)
Potassium: 3.8 mmol/L (ref 3.5–5.1)
Sodium: 146 mmol/L — ABNORMAL HIGH (ref 136–145)
Total Bilirubin: 1.7 mg/dL — ABNORMAL HIGH (ref 0.0–1.0)
Total Protein: 6.9 g/dL (ref 6.4–8.2)

## 2018-03-09 LAB — CBC WITH AUTO DIFFERENTIAL
Basophils %: 0.1 %
Basophils Absolute: 0 10*3/uL (ref 0.0–0.2)
Eosinophils %: 0.7 %
Eosinophils Absolute: 0 10*3/uL (ref 0.0–0.6)
Hematocrit: 29.5 % — ABNORMAL LOW (ref 36.0–48.0)
Hemoglobin: 9 g/dL — ABNORMAL LOW (ref 12.0–16.0)
Lymphocytes %: 3.5 %
Lymphocytes Absolute: 0.2 10*3/uL — ABNORMAL LOW (ref 1.0–5.1)
MCH: 26.9 pg (ref 26.0–34.0)
MCHC: 30.6 g/dL — ABNORMAL LOW (ref 31.0–36.0)
MCV: 88.1 fL (ref 80.0–100.0)
MPV: 10.4 fL (ref 5.0–10.5)
Monocytes %: 1.6 %
Monocytes Absolute: 0.1 10*3/uL (ref 0.0–1.3)
Neutrophils %: 94.1 %
Neutrophils Absolute: 5.7 10*3/uL (ref 1.7–7.7)
Platelets: 216 10*3/uL (ref 135–450)
RBC: 3.35 M/uL — ABNORMAL LOW (ref 4.00–5.20)
RDW: 18.3 % — ABNORMAL HIGH (ref 12.4–15.4)
WBC: 6.1 10*3/uL (ref 4.0–11.0)

## 2018-03-09 LAB — RAPID INFLUENZA A/B ANTIGENS
Rapid Influenza A Ag: POSITIVE — AB
Rapid Influenza B Ag: NEGATIVE

## 2018-03-09 LAB — LACTATE, SEPSIS: Lactic Acid, Sepsis: 1.4 mmol/L (ref 0.4–1.9)

## 2018-03-09 MED ORDER — ONDANSETRON HCL 4 MG/2ML IJ SOLN
4 MG/2ML | INTRAMUSCULAR | Status: DC | PRN
Start: 2018-03-09 — End: 2018-03-09
  Administered 2018-03-09: 22:00:00 4 mg via INTRAVENOUS

## 2018-03-09 MED ORDER — ACETAMINOPHEN 500 MG PO TABS
500 MG | Freq: Once | ORAL | Status: AC
Start: 2018-03-09 — End: 2018-03-09
  Administered 2018-03-09: 23:00:00 1000 mg via ORAL

## 2018-03-09 MED ORDER — IOPAMIDOL 76 % IV SOLN
76 % | Freq: Once | INTRAVENOUS | Status: AC | PRN
Start: 2018-03-09 — End: 2018-03-09
  Administered 2018-03-09: 23:00:00 75 mL via INTRAVENOUS

## 2018-03-09 MED ORDER — IPRATROPIUM-ALBUTEROL 0.5-2.5 (3) MG/3ML IN SOLN
Freq: Once | RESPIRATORY_TRACT | Status: AC
Start: 2018-03-09 — End: 2018-03-09
  Administered 2018-03-09: 1 via RESPIRATORY_TRACT

## 2018-03-09 MED ORDER — LORAZEPAM 2 MG/ML IJ SOLN
2 MG/ML | Freq: Once | INTRAMUSCULAR | Status: AC
Start: 2018-03-09 — End: 2018-03-09
  Administered 2018-03-09: 23:00:00 0.5 mg via INTRAVENOUS

## 2018-03-09 MED ORDER — SODIUM CHLORIDE 0.9 % IV BOLUS
0.9 % | Freq: Once | INTRAVENOUS | Status: AC
Start: 2018-03-09 — End: 2018-03-09
  Administered 2018-03-09: 22:00:00 1000 mL via INTRAVENOUS

## 2018-03-09 MED FILL — ONDANSETRON HCL 4 MG/2ML IJ SOLN: 4 MG/2ML | INTRAMUSCULAR | Qty: 2

## 2018-03-09 MED FILL — IPRATROPIUM-ALBUTEROL 0.5-2.5 (3) MG/3ML IN SOLN: 0.5-2.5 (3) MG/3ML | RESPIRATORY_TRACT | Qty: 3

## 2018-03-09 MED FILL — ACETAMINOPHEN EXTRA STRENGTH 500 MG PO TABS: 500 MG | ORAL | Qty: 2

## 2018-03-09 MED FILL — LORAZEPAM 2 MG/ML IJ SOLN: 2 MG/ML | INTRAMUSCULAR | Qty: 1

## 2018-03-09 NOTE — ED Provider Notes (Signed)
[facility] EMERGENCY DEPARTMENT  EMERGENCY DEPARTMENT ENCOUNTER        Pt Name: Mckenzie Bailey  MRN: [id number]  Birthdate March 02, 1951  Date of evaluation: 03/09/2018  Provider: Fabiola Backer, APRN - CNP  PCP: Lubertha South, MD    This patient was seen and evaluated by the attending physician wenzel, serge      CHIEF COMPLAINT       Chief Complaint   Patient presents with   ??? Chills     states she started feelsing "chills" about an hour ago. sttaes no CP, SOB, N/V/D. states no s/s other then "feels cold"- recent death of son this week       HISTORY OF PRESENT ILLNESS   (Location/Symptom, Timing/Onset, Context/Setting, Quality, Duration, Modifying Factors, Severity)  Note limiting factors.     Mckenzie Bailey is a 67 y.o. female presents to the emergency department today for not feeling well.  The patient does admit that she is under a great deal of stress with the recent death of her son.  She did return from [place] where she has been living with her husband for the past 7 years.  She reports onset of symptoms this morning with chills, fever, nausea.    The patient denies any vomiting, constipation, diarrhea.  States she has not had much to eat today.    She is tearful.    Denies any lightheadedness, dizziness, visual disturbances.  No chest pain or pressure.  No neck or back pain.  No shortness of breath, cough, or congestion.  No abdominal pain, vomiting, diarrhea, constipation, or dysuria.  No rash.    Nursing Notes were all reviewed and agreed with or any disagreements were addressed  in the HPI.    REVIEW OF SYSTEMS    (2-9 systems for level 4, 10 or more for level 5)     Review of Systems   Constitutional: Positive for chills, fatigue and fever. Negative for activity change.   Respiratory: Negative for chest tightness and shortness of breath.    Cardiovascular: Negative for chest pain.   Gastrointestinal: Positive for nausea. Negative for abdominal pain, diarrhea and  vomiting.   Genitourinary: Negative for dysuria.   Musculoskeletal: Positive for arthralgias and myalgias.   All other systems reviewed and are negative.      Positives and Pertinent negatives as per HPI.  Except as noted abovein the ROS, all other systems were reviewed and negative.       PAST MEDICAL HISTORY     Past Medical History:   Diagnosis Date   ??? Anxiety    ??? Apnea    ??? Arthritis    ??? Asthma    ??? CAD (coronary artery disease)     stent placed in right arm 3 years ago, unknown why   ??? Diabetes mellitus (HCC)    ??? Hyperlipidemia    ??? Hypertension          SURGICAL HISTORY   History reviewed. No pertinent surgical history.      CURRENTMEDICATIONS       Previous Medications    ALBUTEROL SULFATE HFA (PROVENTIL HFA) 108 (90 BASE) MCG/ACT INHALER    Inhale 90 mcg into the lungs every 6 hours as needed    ALLOPURINOL (ZYLOPRIM) 300 MG TABLET    Take 300 mg by mouth daily    ATORVASTATIN (LIPITOR) 40 MG TABLET    Take 40 mg by mouth nightly    COLCHICINE (MITIGARE)  0.6 MG CAPSULE    Take 0.6 mg by mouth daily    EMPAGLIFLOZIN (JARDIANCE) 10 MG TABLET    Inject 36 mg as directed Daily with supper    EZETIMIBE (ZETIA) 10 MG TABLET    Take 10 mg by mouth daily    FEXOFENADINE (ALLEGRA) 180 MG TABLET    Take 180 mg by mouth daily as needed    FUROSEMIDE (LASIX) 80 MG TABLET    Take 80 mg by mouth 2 times daily    ISOSORBIDE MONONITRATE (IMDUR) 60 MG EXTENDED RELEASE TABLET    Take 60 mg by mouth daily    LORAZEPAM (ATIVAN) 1 MG TABLET    Take 1 mg by mouth as needed.    NEBIVOLOL (BYSTOLIC) 10 MG TABLET    Take 10 mg by mouth daily    POTASSIUM CHLORIDE (KLOR-CON M20) 20 MEQ EXTENDED RELEASE TABLET    Take 20 mEq by mouth daily    VALSARTAN (DIOVAN) 40 MG TABLET    Take 40 mg by mouth daily    VALSARTAN-HYDROCHLOROTHIAZIDE (DIOVAN-HCT) 320-25 MG PER TABLET    Take 320 tablets by mouth daily         ALLERGIES     Patient has no known allergies.    FAMILYHISTORY     History reviewed. No pertinent family history.        SOCIAL HISTORY       Social History     Tobacco Use   ??? Smoking status: Former Smoker   ??? Smokeless tobacco: Never Used   Substance Use Topics   ??? Alcohol use: Never     Frequency: Never   ??? Drug use: Never       SCREENINGS             PHYSICAL EXAM    (up to 7 for level 4, 8 or more for level 5)     ED Triage Vitals [03/09/18 1655]   BP Temp Temp Source Pulse Resp SpO2 Height Weight   129/85 100.1 ??F (37.8 ??C) Oral 110 16 100 % 5\' 3"  (1.6 m) 250 lb (113.4 kg)       Physical Exam   Constitutional: She is oriented to person, place, and time. She appears well-developed and well-nourished.   HENT:   Head: Normocephalic and atraumatic.   Right Ear: External ear normal.   Left Ear: External ear normal.   Eyes: Right eye exhibits no discharge. Left eye exhibits no discharge.   Neck: Normal range of motion. Neck supple. No JVD present.   Cardiovascular: Normal rate and regular rhythm.   No murmur heard.  Pulmonary/Chest: Effort normal and breath sounds normal. No respiratory distress.   Abdominal: Soft.   Musculoskeletal: Normal range of motion.   Neurological: She is alert and oriented to person, place, and time.   Skin: Skin is warm and dry. She is not diaphoretic. No pallor.   Psychiatric: She has a normal mood and affect. Her behavior is normal.   tearful   Nursing note and vitals reviewed.      DIAGNOSTIC RESULTS   LABS:    Labs Reviewed   RAPID INFLUENZA A/B ANTIGENS - Abnormal; Notable for the following components:       Result Value    Rapid Influenza A Ag POSITIVE (*)     All other components within normal limits    Narrative:     Performed at:  [facility]  [address],  [place], [place] [address]   Phone [phone]   CBC WITH AUTO DIFFERENTIAL - Abnormal; Notable for the following components:    RBC 3.35 (*)     Hemoglobin 9.0 (*)     Hematocrit 29.5 (*)     MCHC 30.6 (*)     RDW 18.3 (*)     Lymphocytes Absolute 0.2 (*)     All other components within normal limits     Narrative:     Performed at:  [facility]  [address]  [place], [place] [address]   Phone [phone]   COMPREHENSIVE METABOLIC PANEL - Abnormal; Notable for the following components:    Sodium 146 (*)     Glucose 159 (*)     BUN 26 (*)     GFR Non-African American 55 (*)     Total Bilirubin 1.7 (*)     Alkaline Phosphatase 177 (*)     AST 73 (*)     All other components within normal limits    Narrative:     Performed at:  [facility]  [address]  [place], [place] [address]   Phone [phone]   BRAIN NATRIURETIC PEPTIDE - Abnormal; Notable for the following components:    Pro-BNP 1,957 (*)     All other components within normal limits    Narrative:     Performed at:  [facility]  [address]  [place], [place] [address]   Phone [phone]   CULTURE BLOOD #2   CULTURE BLOOD #1   LACTATE, SEPSIS    Narrative:     Performed at:  [facility]  [address],  [place], [place] [address]   Phone [phone]   TROPONIN    Narrative:     Performed at:  [facility]  [address]  [place], [place] [address]   Phone [phone]   URINE RT REFLEX TO CULTURE       All other labs were within normal range or not returned as of this dictation.    EKG: All EKG's are interpreted by the Emergency Department Physician in the absence of a cardiologist.  Please see their note for interpretation of EKG.      RADIOLOGY:   Non-plain film images such as CT, Ultrasound and MRI are read by the radiologist. Plain radiographic images are visualized andpreliminarily interpreted by the  ED Provider with the below findings:        Interpretation perthe Radiologist below, if available at the time of this note:    CT Chest Pulmonary Embolism W Contrast   Final Result   No evidence of pulmonary embolism to the segmental level.      At least moderate cardiomegaly, with prominence of the  right-sided chambers   as above.         XR CHEST STANDARD (2 VW)   Final Result   Enlargement of the cardiac silhouette may represent a pericardial effusion.   Otherwise, no acute abnormalities seen in the chest           Xr Chest Standard (2 Vw)    Result Date: 03/09/2018  EXAMINATION: TWO XRAY VIEWS OF THE CHEST 03/09/2018 5:20 pm COMPARISON: None. HISTORY: ORDERING SYSTEM PROVIDED HISTORY: Chest Discomfort TECHNOLOGIST PROVIDED HISTORY: Reason for exam:->Chest Discomfort Reason for Exam: States she started feeling "chills" about an hour ago. states no  CP, SOB, N/V/D. Acuity: Acute Type of Exam: Initial FINDINGS: Cardiac silhouette is enlarged.  No acute airspace disease.  No pleural effusion.  No pulmonary edema.     Enlargement of the cardiac silhouette may represent a pericardial effusion. Otherwise, no acute abnormalities seen in the chest           PROCEDURES   Unless otherwise noted below, none     Procedures    CRITICAL CARE TIME   N/A    CONSULTS:  IP CONSULT TO HOSPITALIST      EMERGENCY DEPARTMENT COURSE and DIFFERENTIAL DIAGNOSIS/MDM:   Vitals:    Vitals:    03/09/18 1944 03/09/18 1953 03/09/18 2000 03/09/18 2030   BP:  129/77 129/64 132/74   Pulse:    102   Resp:       Temp:    101.8 ??F (38.8 ??C)   TempSrc:    Oral   SpO2: 96% 98% 97% 100%   Weight:       Height:           Patient was given thefollowing medications:  Medications   ondansetron (ZOFRAN) injection 4 mg (4 mg Intravenous Given 03/09/18 1825)   acetaminophen (TYLENOL) tablet 1,000 mg (1,000 mg Oral Given 03/09/18 1831)   0.9 % sodium chloride bolus (1,000 mLs Intravenous New Bag 03/09/18 1809)   LORazepam (ATIVAN) injection 0.5 mg (0.5 mg Intravenous Given 03/09/18 1831)   ipratropium-albuterol (DUONEB) nebulizer solution 1 ampule (1 ampule Inhalation Given 03/09/18 1944)   iopamidol (ISOVUE-370) 76 % injection 75 mL (75 mLs Intravenous Given 03/09/18 1902)   oseltamivir (TAMIFLU) capsule 75 mg (75 mg Oral Given 03/09/18 2042)        Briefly, this is a 68 year old female that presents to the emergency department today for not feeling well.  The patient does admit that she is under a great deal of stress with the recent death of her son.  She did return from [place] where she has been living with her husband for the past 7 years.  She reports onset of symptoms this morning with chills, fever, nausea.    The patient denies any vomiting, constipation, diarrhea.  States she has not had much to eat today.    She is tearful.    Patient was given Tylenol, fluids, Zofran.  Fluids were given at 500 mL's per hour.  The patient did complain of feeling short of breath and she did become tachypneic, she appeared anxious.  She was given half milligram of Ativan and states that she is feeling much better.  She was given a DuoNeb treatment.    CBC shows no leukocytosis.  Hemoglobin 9.0.  Platelets 216.  CMP shows BUN of 26, otherwise unremarkable.  Lactate is normal.  Troponin negative.  BNP 1957.    XR CHEST STANDARD (2 VW) (Final result)   Result time 03/09/18 17:49:40   Final result by Georga Bora, MD (03/09/18 17:49:40)                Impression:    Enlargement of the cardiac silhouette may represent a pericardial effusion.  Otherwise, no acute abnormalities seen in the chest              Influenza A is positive.  Patient was given Tamiflu.    She did have a hip replacement recently, with recent surgery, tachycardia, CTA was ordered to rule out pulmonary embolism.    CT Chest Pulmonary Embolism W Contrast (Final  result)   Result time 03/09/18 19:52:05   Final result by Wynn Banker, MD (03/09/18 19:52:05)                Impression:    No evidence of pulmonary embolism to the segmental level.    At least moderate cardiomegaly, with prominence of the right-sided chambers  as above.              I did reevaluate patient multiple times.  She remains tachycardic at 115 at rest.  Oral temperature has increased to 101.8 after Tylenol.  She is  requiring supplemental oxygen with an oxygen saturation of 94% on 2 L.    She will be admitted for influenza A, febrile illness, shortness of breath with hypoxia.  The patient is agreeable regarding plan of care.  Hospitalist is consulted for admission of this patient.    FINAL IMPRESSION      1. Influenza A    2. Tachycardia    3. Hypoxia          DISPOSITION/PLAN   DISPOSITION Decision To Admit 03/09/2018 08:50:50 PM      PATIENT REFERREDTO:  No follow-up provider specified.    DISCHARGE MEDICATIONS:  New Prescriptions    No medications on file       DISCONTINUED MEDICATIONS:  Discontinued Medications    No medications on file              (Please note that portions ofthis note were completed with a voice recognition program.  Efforts were made to edit the dictations but occasionally words are mis-transcribed.)    Fabiola Backer, APRN - CNP (electronically signed)           Fabiola Backer, APRN - CNP  03/09/18 2100

## 2018-03-09 NOTE — ED Notes (Signed)
Bed: 14  Expected date:   Expected time:   Means of arrival:   Comments:  [facility] [id number] RN  03/09/18 [phone]

## 2018-03-09 NOTE — ED Notes (Signed)
Admitting MD in to see pt. Pts husband came to nurses station to speak to me about the conversation. Husband stated that the admitting MD told the pt that he was going to order her something to help her sleep and that he was going to give her lasik. Pts husband stated that pt would prefer a catheter if he is going to give her lasik. I told pt and her husband that I would inform the admitting unit about her request.      Manvir Prabhu, RN  03/09/18 2237

## 2018-03-09 NOTE — ED Notes (Signed)
Attempted to call report on pt. Unit Tech that answered the phone stated, "It has not been 10 minutes," and hung up on me. Charge RN aware.     Makaylin Carlo, RN  03/09/18 2223

## 2018-03-09 NOTE — Plan of Care (Signed)
Problem: Falls - Risk of:  Goal: Will remain free from falls  Outcome: Ongoing  Goal: Absence of physical injury  Outcome: Ongoing

## 2018-03-09 NOTE — ED Notes (Signed)
Pt sitting up in a chair bedside of her bed. Pt able to ambulate alone, no gait disturbance.      Edie Vallandingham, RN  03/09/18 2218

## 2018-03-09 NOTE — H&P (Addendum)
_    [facility]    03/09/2018 10:05 PM    Patient Information:  Mckenzie Bailey is a 67 y.o. female [id number]    PCP:  Mckenzie South, MD (Tel: None )    Date of Service:   Pt seen/examined on 03/09/2018   Placed in Observation.    Chief complaint:    Chief Complaint   Patient presents with   ??? Chills     states she started feelsing "chills" about an hour ago. sttaes no CP, SOB, N/V/D. states no s/s other then "feels cold"- recent death of son this week       History of Present Illness:  Mckenzie Bailey is a 67 y.o. female who presented with   ?? Mode of arrival : Walk in  ?? Chills @ Home   ?? Is anxious , since she had a Recent death of son this week   ?? Is travelling [facility] from [facility] for son's burial   ?? Also having leg edema   ?? Also reported SOB , on rest and exertion ,   ?? No chest pain   ?? Nausea +   ?? No vomiting   ?? No diarrhea   ?? No blood in stools noted . Is on Eliquis BID for AFIB @ Home .       History obtained from   ?? Patient      ?? Prior chart  As well       So far, ED Findings/Workup/Labs shows :   ?? Significant Findings in Vitals in ED : Hypoxemia   ?? Fever +   ?? Tachycardia +         While in ED, patient care and medications given :   ?? Imaging  CXR +  CT  , done in ED   ?? IVF   ?? Tylenol   ?? Nebs   ?? Ativan   ?? Tamilfu       ?? Imaging done in ED as below. And is/are s/o cardiomegaly and ? Pericardia effusion ?     ?? Pertinent Abnormal Labs as mentioned below.       Currently, on my evaluation, patient is :   ?? Since arrival, post above Rx, patient is AO   ?? Is on NC Now   ?? No SOB @ rest   ?? No CP @ rest   ?? No signs of acute distress s/o hemodynamic instability , reported/noted currently.      REVIEW OF SYSTEMS:       Constitutional: as above   ENT:   Negative for rhinorrhea, epistaxis, hoarseness, sore throat.  Respiratory:   As above   Cardiovascular:   Negative for  chest pain, palpitations   Gastrointestinal:   Negative for nausea, vomiting,  diarrhea  Genitourinary:   Negative for polyuria, dysuria   Hematologic/Lymphatic:   Negative for  bleeding tendency, easy bruising  Musculoskeletal:   Negative for myalgias and arthralgias  Neurologic:   Negative for  Confusion, dysarthria.  Skin:   Negative for itching,rash  Psychiatric:  Negative for depression, anxiety, agitation.  Endocrine:  Negative for polydipsia, polyuria,heat /cold intolerance.      Past Medical History:         has a past medical history of Anxiety, Apnea, Arthritis, Asthma, CAD (coronary artery disease), Diabetes mellitus (HCC), Hyperlipidemia, and Hypertension.       Past Surgical History:  has no past surgical history      Medications:            No current facility-administered medications on file prior to encounter.      Current Outpatient Medications on File Prior to Encounter   Medication Sig Dispense Refill   ??? valsartan (DIOVAN) 40 MG tablet Take 40 mg by mouth daily     ??? albuterol sulfate HFA (PROVENTIL HFA) 108 (90 Base) MCG/ACT inhaler Inhale 90 mcg into the lungs every 6 hours as needed     ??? atorvastatin (LIPITOR) 40 MG tablet Take 40 mg by mouth nightly     ??? ezetimibe (ZETIA) 10 MG tablet Take 10 mg by mouth daily     ??? fexofenadine (ALLEGRA) 180 MG tablet Take 180 mg by mouth daily as needed     ??? furosemide (LASIX) 80 MG tablet Take 80 mg by mouth 2 times daily     ??? isosorbide mononitrate (IMDUR) 60 MG extended release tablet Take 60 mg by mouth daily     ??? LORazepam (ATIVAN) 1 MG tablet Take 1 mg by mouth as needed.     ??? nebivolol (BYSTOLIC) 10 MG tablet Take 10 mg by mouth daily     ??? potassium chloride (KLOR-CON M20) 20 MEQ extended release tablet Take 20 mEq by mouth daily     ??? allopurinol (ZYLOPRIM) 300 MG tablet Take 300 mg by mouth daily     ??? colchicine (MITIGARE) 0.6 MG capsule Take 0.6 mg by mouth daily     ??? empagliflozin (JARDIANCE) 10 MG tablet Inject 36 mg as directed Daily with supper     ??? valsartan-hydrochlorothiazide (DIOVAN-HCT) 320-25 MG  per tablet Take 320 tablets by mouth daily           Allergies:  No Known Allergies       Social History:   reports that she has quit smoking. She has never used smokeless tobacco. She reports that she does not drink alcohol or use drugs.       Family History:      Mother . COPD   Father . CAD     Physical Exam:  BP 115/65    Pulse 102    Temp 101.8 ??F (38.8 ??C) (Oral)    Resp 15    Ht 5\' 3"  (1.6 m)    Wt 250 lb (113.4 kg)    LMP  (Exact Date)    SpO2 99%    BMI 44.29 kg/m??     General appearance: Appears comfortable.  Well nourished   Eyes:  Sclera clear, pupils equal  ENT:  Moist mucus membranes, Trachea midline.  Cardiovascular: Regular rhythm, normal S1, S2. No murmur, gallop, rub. B/L LE pitting edema in lower extremities   Respiratory: Noted Clear to auscultation bilaterally,  No wheeze, good inspiratory effort   Gastrointestinal:  Abdomen soft,  non-tender,  not distended,  normal bowel sounds   Musculoskeletal:  No cyanosis in digits, neck supple  Neurology:  Cranial nerves grossly intact. Alert and oriented in time, place and person. No speech or motor deficits   Psychiatry:  Appropriate affect. Not agitated  Skin:  Warm, dry, normal turgor, no rash       Labs:     Recent Labs     03/09/18  1729   WBC 6.1   HGB 9.0*   HCT 29.5*   PLT 216     Recent Labs     03/09/18  1729  NA 146*   K 3.8   CL 106   CO2 27   BUN 26*   CREATININE 1.0   CALCIUM 9.2     Recent Labs     03/09/18  1729   AST 73*   ALT 36   BILITOT 1.7*   ALKPHOS 177*     No results for input(s): INR in the last 72 hours.  Recent Labs     03/09/18  1729   TROPONINI <0.01         Urinalysis:    No results found for: NITRU, WBCUA, BACTERIA, RBCUA, BLOODU, SPECGRAV, GLUCOSEU      Radiology:     CXR:   I have reviewed the CXR  Cardiomegaly and ? Pericardial effusion     EKG/Telemonitor :    I have reviewed the EKG  ST   PVCs     IMAGING :     CT Chest Pulmonary Embolism W Contrast   Final Result   No evidence of pulmonary embolism to the segmental  level.      At least moderate cardiomegaly, with prominence of the right-sided chambers   as above.         XR CHEST STANDARD (2 VW)   Final Result   Enlargement of the cardiac silhouette may represent a pericardial effusion.   Otherwise, no acute abnormalities seen in the chest               ASSESSMENT / PLAN     Active Hospital Problems    Diagnosis Date Noted   ??? Influenza A [J10.1] 03/09/2018   ??? Essential hypertension [I10] 03/09/2018         ?? Influenza A . Started Tamiflu in Ed. Complete course X 10 doses   ?? Bronchospasm vs reactive airway disease . Nebs prn and sch .   ?? Acute Respiratory failure with Hypoxemia  d/t above [ Present on arrival/admission ] : Now on Nasal Cannula O2  => Will attempt to  Titrate O2 down as tolerated by patient. Wean per protocol to maintain Saturation > 92 . CTPA done in ED and shows no PE and no PNA   ?? Asthma ? @ baseline . Nebs prn /sch   ?? Sinus tachycardia , 2/2 above and fever  . Cardiac monitor   ?? Anxiety d/o . Medications prn   ?? Fever , 2/2 influenza as above . Will f/u trend during inpatient stay and monitor closely.   ?? Cardiomegaly and suspected effusion on CXR and high BNP and LE pitting edema .?? Received IVF X 1 L in ED. Will avoid further IVF for now . Echo planned in AM . Is on Diuretics BID @ Home . Resumed home dosing   ?? Essential Hypertension . Chronic condition. Monitor/Manage/Adjust medications, while in house prn. For now, resumed home medications of Imdur + BB + ARBs   ?? AFIB . Chronic condition. Monitor/Manage/Adjust medications, while in house prn. For now, resumed home medications of Eliquis and BB . Rate controlled and ST w/PVCs now   ?? Mixed Hyperlipidemia . Chronic condition. Monitor/Manage/Adjust medications, while in house prn. For now, resumed home medications of statins and Zetia   ?? CAD , s/p PCI in past . Chronic condition. Monitor/Manage/Adjust medications, while in house prn. For now, resumed home medications of Statins and BB and  Nitrates   ?? Hyperglycemia , ? 2/2 Borderline DM. Inpatient diet as per orders. While inpatient, will closely monitor blood sugars and  medicate with insulin per orders. Will adjust medications while IP for optimal control of symptoms/BS , as needed, depending on Blood sugars progression while in house.  Rx of Hypoglycemia prn , per order sets. A1c check in AM   ?? Anemia , ? 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 Is on Eliquis BID @ Home . Check stool occ . Will Check iron studies . Check B12/Folate levels .  => Will f/u trend during inpatient stay and monitor closely. Consider hematology f/u as OP   ?? OSA on CPAP @ HS   ?? Insomnia . Melatonin prn       ?? Home medications for Chronic medical problems reviewed.   ?? Home medications Held/Resumed, and Pertinent changes made in home medications on admission, as needed, according to current medical status, as mentioned above. Please see EPIC orders for detailed recommendations of plan and medications       ?? DVT Prophylaxis :  Eliquis PO + SCDs   ?? GI Prophylaxis : H2RB as per orders   ?? Diet: DIET CARDIAC;  ?? Code Status: Full Code        Consults @ admission  :  ??? IP CONSULT TO HOSPITALIST  ???       ?? PT/OT and ambulatory Eval Status: Ambulate with Assist    ?? Probable LOS & future Disposition planned post discharge  - Home in 1-2 days       Medical Decision Making : HIGH     Total patient Care time spent in evaluating the patient an discussing plan with appropriate staff/patient/family members is 55 min       Tonyia Marschall [facility], MD    Hospitalist, [employer].   03/09/2018 10:40 PM

## 2018-03-09 NOTE — ED Provider Notes (Signed)
I  cared for and evaluated the patient in conjunction with the ED Advanced Practice Provider    HISTORY OF PRESENT ILLNESS  Mckenzie Bailey is a 67 y.o. female presents to the emergency department chief complaint of chills that started several hours prior to arrival.  She denies any chest pain states that she does have a cough without any specific congestion states that she is been having some shortness of breath and generalized body fatigue symptoms.  She denies nausea denies vomiting denies diarrhea denies abdominal pain.  States that she has had decreased appetite.  She states that she is in town because of the recent death of her son otherwise does plan to stay in town for an extended period of time  She denies any specific pain complaints she has not taken any medications for symptom control at home.  She does admit to history of asthma and anxiety just of heart failure and recent total hip replacement October 3  Denies history of pulmonary embolism.      PHYSICAL EXAM  BP 132/74   Pulse 102   Temp 101.8 F (38.8 C) (Oral)   Resp 15   Ht 5\' 3"  (1.6 m)   Wt 250 lb (113.4 kg)   LMP  (Exact Date)   SpO2 100%   BMI 44.29 kg/m     On exam, the patient appears in no acute distress. Speech is clear. Breathing is unlabored.  Moves all extremities  Heart: Tachycardic S1-S2 heard holosystolic murmur  Lungs: Exhalation wheezes are heard in the right it will feel otherwise equal excursion no accessory muscle usage  Extremities: Pitting edema noted bilateral lower extremities 2+ in nature mild tenderness to palpation throughout the extremities no rashes or lesions    EKG is performed that shows atrial fibrillation with multiple PVCs and a rapid ventricular response rate of 104 bpm there is no acute ST elevation nonspecific T wave inversion throughout anterior lateral leads without prior EKG for comparison left left axis deviation    IV access was established blood was drawn sent for analysis.  The patient was  started on IV fluids given medications for symptom control.  Vital signs are stabilized.  Found to be positive for influenza continued to remain tachypneic and tachycardic despite treatments and will be admitted to the hospitalist service for definitive treatment.  Placed on supplemental nasal cannula oxygen 19 saturations greater than 90%.    All diagnostic, treatment, and disposition decisions were made by myself in conjunction with the advanced practice provider.    For all further details of the patient's emergency department visit, please see the advanced practice provider's documentation.    RADIOLOGY  Xr Chest Standard (2 Vw)    Result Date: 03/09/2018  EXAMINATION: TWO XRAY VIEWS OF THE CHEST 03/09/2018 5:20 pm COMPARISON: None. HISTORY: ORDERING SYSTEM PROVIDED HISTORY: Chest Discomfort TECHNOLOGIST PROVIDED HISTORY: Reason for exam:->Chest Discomfort Reason for Exam: States she started feeling "chills" about an hour ago. states no CP, SOB, N/V/D. Acuity: Acute Type of Exam: Initial FINDINGS: Cardiac silhouette is enlarged.  No acute airspace disease.  No pleural effusion.  No pulmonary edema.     Enlargement of the cardiac silhouette may represent a pericardial effusion. Otherwise, no acute abnormalities seen in the chest     Ct Chest Pulmonary Embolism W Contrast    Result Date: 03/09/2018  EXAMINATION: CTA OF THE CHEST 03/09/2018 7:04 pm TECHNIQUE: CTA of the chest was performed after the administration of intravenous contrast.  Multiplanar  reformatted images are provided for review.  MIP images are provided for review. Dose modulation, iterative reconstruction, and/or weight based adjustment of the mA/kV was utilized to reduce the radiation dose to as low as reasonably achievable. COMPARISON: Chest x-ray performed today HISTORY: ORDERING SYSTEM PROVIDED HISTORY: Shortness of breath TECHNOLOGIST PROVIDED HISTORY: Reason for exam:->shorntess of breath Reason for Exam: Chills (states she started feelsing  "chills" about an hour ago. sttaes no CP, SOB, N/V/D. states no s/s other then "feels cold"- recent death of son this week) Acuity: Acute Type of Exam: Initial Additional signs and symptoms: recent knee surg, positive for flu History of diabetes, hyperlipidemia, hypertension, coronary artery disease, asthma FINDINGS: Pulmonary Arteries: Motion artifact degrades many of the images.  Many of the distal segmental and subsegmental pulmonary arteries are obscured.  No filling defects are identified within the central, lobar or proximal segmental pulmonary arteries to suggest embolism. Mediastinum: No evidence of mediastinal lymphadenopathy.  The heart and pericardium demonstrate no acute abnormality.  There is no acute abnormality of the thoracic aorta.  There is four-chamber cardiac enlargement, particularly the right-sided chambers.  Coronary arterial calcifications are present.  Densely calcified subcarinal lymph nodes are present.  No mediastinal adenopathy.  The thoracic aorta is within normal limits in caliber and course without acute abnormality. There is reflux of contrast into the intrahepatic IVC and hepatic veins which appear dilated, finding which can be seen with right heart dysfunction. Lungs/pleura: The central airways are patent.  Minimal bibasilar atelectasis is noted.  No effusion, focal consolidation or edema. Upper Abdomen: Limited images of the upper abdomen are otherwise unremarkable. Soft Tissues/Bones: No acute bony abnormality.     No evidence of pulmonary embolism to the segmental level. At least moderate cardiomegaly, with prominence of the right-sided chambers as above.        Final diagnoses:   Influenza A   Tachycardia   Hypoxia   Sepsis due to Haemophilus influenzae without acute organ dysfunction [facility])     The total Critical Care time is 35 minutes which excludes separately billable procedures.      Comment: Please note this report has been produced using speech recognition software and  may contain errors related to that system including errors in grammar, punctuation, and spelling, as well as words and phrases that may be inappropriate. If there are any questions or concerns please feel free to contact the dictating provider for clarification.      Nori Riis, DO  03/09/18 [id number]

## 2018-03-10 LAB — EKG 12-LEAD
Atrial Rate: 92 {beats}/min
P Axis: 97 degrees
Q-T Interval: 370 ms
QRS Duration: 170 ms
QTc Calculation (Bazett): 507 ms
R Axis: 90 degrees
T Axis: -87 degrees
Ventricular Rate: 113 {beats}/min

## 2018-03-10 LAB — COMPREHENSIVE METABOLIC PANEL
ALT: 91 U/L — ABNORMAL HIGH (ref 10–40)
AST: 165 U/L — ABNORMAL HIGH (ref 15–37)
Albumin/Globulin Ratio: 1 — ABNORMAL LOW (ref 1.1–2.2)
Albumin: 3.5 g/dL (ref 3.4–5.0)
Alkaline Phosphatase: 209 U/L — ABNORMAL HIGH (ref 40–129)
Anion Gap: 15 (ref 3–16)
BUN: 26 mg/dL — ABNORMAL HIGH (ref 7–20)
CO2: 21 mmol/L (ref 21–32)
Calcium: 9.2 mg/dL (ref 8.3–10.6)
Chloride: 107 mmol/L (ref 99–110)
Creatinine: 1.2 mg/dL (ref 0.6–1.2)
GFR African American: 54 — AB (ref >60)
GFR Non-African American: 45 — AB (ref >60)
Globulin: 3.4 g/dL
Glucose: 193 mg/dL — ABNORMAL HIGH (ref 70–99)
Potassium: 3.9 mmol/L (ref 3.5–5.1)
Sodium: 143 mmol/L (ref 136–145)
Total Bilirubin: 3.1 mg/dL — ABNORMAL HIGH (ref 0.0–1.0)
Total Protein: 6.9 g/dL (ref 6.4–8.2)

## 2018-03-10 LAB — CBC WITH AUTO DIFFERENTIAL
Basophils %: 0.1 %
Basophils Absolute: 0 10*3/uL (ref 0.0–0.2)
Eosinophils %: 0 %
Eosinophils Absolute: 0 10*3/uL (ref 0.0–0.6)
Hematocrit: 31.6 % — ABNORMAL LOW (ref 36.0–48.0)
Hemoglobin: 9.4 g/dL — ABNORMAL LOW (ref 12.0–16.0)
Lymphocytes %: 3.4 %
Lymphocytes Absolute: 0.7 10*3/uL — ABNORMAL LOW (ref 1.0–5.1)
MCH: 26.6 pg (ref 26.0–34.0)
MCHC: 29.8 g/dL — ABNORMAL LOW (ref 31.0–36.0)
MCV: 89.2 fL (ref 80.0–100.0)
MPV: 10.2 fL (ref 5.0–10.5)
Monocytes %: 4.4 %
Monocytes Absolute: 0.8 10*3/uL (ref 0.0–1.3)
Neutrophils %: 92.1 %
Neutrophils Absolute: 17.7 10*3/uL — ABNORMAL HIGH (ref 1.7–7.7)
Platelets: 200 10*3/uL (ref 135–450)
RBC: 3.54 M/uL — ABNORMAL LOW (ref 4.00–5.20)
RDW: 18.7 % — ABNORMAL HIGH (ref 12.4–15.4)
WBC: 19.2 10*3/uL — ABNORMAL HIGH (ref 4.0–11.0)

## 2018-03-10 LAB — POCT GLUCOSE
POC Glucose: 213 mg/dl — ABNORMAL HIGH (ref 70–99)
POC Glucose: 143 mg/dl — ABNORMAL HIGH (ref 70–99)
POC Glucose: 137 mg/dl — ABNORMAL HIGH (ref 70–99)
POC Glucose: 151 mg/dl — ABNORMAL HIGH (ref 70–99)

## 2018-03-10 LAB — ECHOCARDIOGRAM COMPLETE 2D W DOPPLER W COLOR: Left Ventricular Ejection Fraction: 35

## 2018-03-10 LAB — LACTATE DEHYDROGENASE: LD: 466 U/L — ABNORMAL HIGH (ref 100–190)

## 2018-03-10 LAB — MICROSCOPIC URINALYSIS
Epithelial Cells, UA: 1 /HPF (ref 0–5)
Hyaline Casts, UA: 2 /LPF (ref 0–8)
RBC, UA: 0 /HPF (ref 0–4)
WBC, UA: 1 /HPF (ref 0–5)

## 2018-03-10 LAB — URINALYSIS WITH REFLEX TO CULTURE
Blood, Urine: NEGATIVE
Glucose, Ur: NEGATIVE mg/dL
Ketones, Urine: NEGATIVE mg/dL
Leukocyte Esterase, Urine: NEGATIVE
Nitrite, Urine: NEGATIVE
Specific Gravity, UA: 1.019 (ref 1.005–1.030)
Urobilinogen, Urine: 1 E.U./dL (ref <2.0)
pH, UA: 6 (ref 5.0–8.0)

## 2018-03-10 LAB — RETICULOCYTES
Hematocrit: 29.4 % — ABNORMAL LOW (ref 36.0–48.0)
Immature Retic Fract: 0.58 — ABNORMAL HIGH (ref 0.21–0.37)
Retic Ct Abs: 0.079 M/uL (ref 0.024–0.100)
Retic Ct Pct: 2.32 % — ABNORMAL HIGH (ref 0.50–2.18)

## 2018-03-10 LAB — HEMOGLOBIN A1C
Estimated Avg Glucose: 128.4 mg/dL
Hemoglobin A1C: 6.1 %

## 2018-03-10 LAB — TSH WITH REFLEX TO FT4: TSH Reflex FT4: 2.85 u[IU]/mL (ref 0.27–4.20)

## 2018-03-10 LAB — VITAMIN B12 & FOLATE
Folate: >20 ng/mL (ref 4.78–24.20)
Vitamin B-12: 1360 pg/mL — ABNORMAL HIGH (ref 211–911)

## 2018-03-10 LAB — TROPONIN: Troponin: <0.01 ng/mL (ref <0.01)

## 2018-03-10 LAB — MAGNESIUM: Magnesium: 1.8 mg/dL (ref 1.80–2.40)

## 2018-03-10 LAB — BLOOD OCCULT STOOL DIAGNOSTIC: Occult Blood Diagnostic: NEGATIVE

## 2018-03-10 LAB — BRAIN NATRIURETIC PEPTIDE: Pro-BNP: 1957 pg/mL — ABNORMAL HIGH (ref 0–124)

## 2018-03-10 LAB — CULTURE, BLOOD, PCR ID PANEL RESULTS REPORT

## 2018-03-10 MED ORDER — IPRATROPIUM-ALBUTEROL 0.5-2.5 (3) MG/3ML IN SOLN
Freq: Four times a day (QID) | RESPIRATORY_TRACT | Status: DC
Start: 2018-03-10 — End: 2018-03-14
  Administered 2018-03-10 – 2018-03-14 (×15): 1 via RESPIRATORY_TRACT

## 2018-03-10 MED ORDER — POTASSIUM CHLORIDE 10 MEQ/100ML IV SOLN
10 MEQ/0ML | INTRAVENOUS | Status: DC | PRN
Start: 2018-03-10 — End: 2018-03-14

## 2018-03-10 MED ORDER — ZOLPIDEM TARTRATE 5 MG PO TABS
5 MG | Freq: Every evening | ORAL | Status: DC | PRN
Start: 2018-03-10 — End: 2018-03-14
  Administered 2018-03-11 – 2018-03-14 (×4): 5 mg via ORAL

## 2018-03-10 MED ORDER — DEXTROSE 5 % IV SOLN
5 % | INTRAVENOUS | Status: DC | PRN
Start: 2018-03-10 — End: 2018-03-14

## 2018-03-10 MED ORDER — MELATONIN 3 MG PO TABS
3 MG | Freq: Every evening | ORAL | Status: DC | PRN
Start: 2018-03-10 — End: 2018-03-14
  Administered 2018-03-10: 04:00:00 3 mg via ORAL

## 2018-03-10 MED ORDER — POTASSIUM CHLORIDE CRYS ER 20 MEQ PO TBCR
20 MEQ | ORAL | Status: DC | PRN
Start: 2018-03-10 — End: 2018-03-14

## 2018-03-10 MED ORDER — APIXABAN 5 MG PO TABS
5 MG | Freq: Two times a day (BID) | ORAL | Status: DC
Start: 2018-03-10 — End: 2018-03-14
  Administered 2018-03-10 – 2018-03-14 (×9): 5 mg via ORAL

## 2018-03-10 MED ORDER — ALBUTEROL SULFATE HFA 108 (90 BASE) MCG/ACT IN AERS
108 (90 Base) MCG/ACT | Freq: Four times a day (QID) | RESPIRATORY_TRACT | Status: DC | PRN
Start: 2018-03-10 — End: 2018-03-14

## 2018-03-10 MED ORDER — EZETIMIBE 10 MG PO TABS
10 MG | Freq: Every day | ORAL | Status: DC
Start: 2018-03-10 — End: 2018-03-09

## 2018-03-10 MED ORDER — ISOSORBIDE MONONITRATE ER 60 MG PO TB24
60 MG | Freq: Every day | ORAL | Status: DC
Start: 2018-03-10 — End: 2018-03-14
  Administered 2018-03-10 – 2018-03-14 (×5): 60 mg via ORAL

## 2018-03-10 MED ORDER — FUROSEMIDE 40 MG PO TABS
40 MG | Freq: Two times a day (BID) | ORAL | Status: DC
Start: 2018-03-10 — End: 2018-03-13
  Administered 2018-03-10 – 2018-03-13 (×5): 80 mg via ORAL

## 2018-03-10 MED ORDER — OSELTAMIVIR PHOSPHATE 75 MG PO CAPS
75 MG | Freq: Once | ORAL | Status: AC
Start: 2018-03-10 — End: 2018-03-09
  Administered 2018-03-10: 01:00:00 75 mg via ORAL

## 2018-03-10 MED ORDER — ACETAMINOPHEN 325 MG PO TABS
325 MG | ORAL | Status: DC | PRN
Start: 2018-03-10 — End: 2018-03-11

## 2018-03-10 MED ORDER — FAMOTIDINE 20 MG PO TABS
20 MG | Freq: Two times a day (BID) | ORAL | Status: DC
Start: 2018-03-10 — End: 2018-03-14
  Administered 2018-03-10 – 2018-03-14 (×10): 20 mg via ORAL

## 2018-03-10 MED ORDER — INSULIN LISPRO (1 UNIT DIAL) 100 UNIT/ML SC SOPN
100 UNIT/ML | Freq: Three times a day (TID) | SUBCUTANEOUS | Status: DC
Start: 2018-03-10 — End: 2018-03-14
  Administered 2018-03-10 – 2018-03-11 (×3): 1 [IU] via SUBCUTANEOUS
  Administered 2018-03-12: 21:00:00 3 [IU] via SUBCUTANEOUS
  Administered 2018-03-12 – 2018-03-14 (×2): 1 [IU] via SUBCUTANEOUS

## 2018-03-10 MED ORDER — GLUCOSE 40 % PO GEL
40 % | ORAL | Status: DC | PRN
Start: 2018-03-10 — End: 2018-03-14

## 2018-03-10 MED ORDER — FUROSEMIDE 40 MG PO TABS
40 MG | Freq: Once | ORAL | Status: DC
Start: 2018-03-10 — End: 2018-03-14

## 2018-03-10 MED ORDER — MAGNESIUM HYDROXIDE 400 MG/5ML PO SUSP
400 MG/5ML | Freq: Every day | ORAL | Status: DC | PRN
Start: 2018-03-10 — End: 2018-03-14

## 2018-03-10 MED ORDER — POTASSIUM BICARB-CITRIC ACID 20 MEQ PO TBEF
20 MEQ | ORAL | Status: DC | PRN
Start: 2018-03-10 — End: 2018-03-14

## 2018-03-10 MED ORDER — INSULIN LISPRO (1 UNIT DIAL) 100 UNIT/ML SC SOPN
100 UNIT/ML | Freq: Every evening | SUBCUTANEOUS | Status: DC
Start: 2018-03-10 — End: 2018-03-14
  Administered 2018-03-10 – 2018-03-14 (×3): 1 [IU] via SUBCUTANEOUS

## 2018-03-10 MED ORDER — ONDANSETRON HCL 4 MG/2ML IJ SOLN
4 MG/2ML | Freq: Four times a day (QID) | INTRAMUSCULAR | Status: DC | PRN
Start: 2018-03-10 — End: 2018-03-14
  Administered 2018-03-10 – 2018-03-11 (×3): 4 mg via INTRAVENOUS

## 2018-03-10 MED ORDER — DEXTROSE 50 % IV SOLN
50 % | INTRAVENOUS | Status: DC | PRN
Start: 2018-03-10 — End: 2018-03-14

## 2018-03-10 MED ORDER — ATORVASTATIN CALCIUM 40 MG PO TABS
40 MG | Freq: Every evening | ORAL | Status: DC
Start: 2018-03-10 — End: 2018-03-14
  Administered 2018-03-11 – 2018-03-14 (×4): 40 mg via ORAL

## 2018-03-10 MED ORDER — NORMAL SALINE FLUSH 0.9 % IV SOLN
0.9 % | INTRAVENOUS | Status: DC | PRN
Start: 2018-03-10 — End: 2018-03-14

## 2018-03-10 MED ORDER — POTASSIUM CHLORIDE CRYS ER 20 MEQ PO TBCR
20 MEQ | Freq: Every day | ORAL | Status: DC
Start: 2018-03-10 — End: 2018-03-14
  Administered 2018-03-10 – 2018-03-14 (×5): 20 meq via ORAL

## 2018-03-10 MED ORDER — OSELTAMIVIR PHOSPHATE 75 MG PO CAPS
75 MG | Freq: Two times a day (BID) | ORAL | Status: DC
Start: 2018-03-10 — End: 2018-03-11
  Administered 2018-03-10 – 2018-03-11 (×3): 75 mg via ORAL

## 2018-03-10 MED ORDER — MAGNESIUM SULFATE IN D5W 1-5 GM/100ML-% IV SOLN
1-5 GM/100ML-% | INTRAVENOUS | Status: DC | PRN
Start: 2018-03-10 — End: 2018-03-14

## 2018-03-10 MED ORDER — PERFLUTREN LIPID MICROSPHERE IV SUSP
Freq: Once | INTRAVENOUS | Status: DC | PRN
Start: 2018-03-10 — End: 2018-03-14

## 2018-03-10 MED ORDER — NORMAL SALINE FLUSH 0.9 % IV SOLN
0.9 % | Freq: Two times a day (BID) | INTRAVENOUS | Status: DC
Start: 2018-03-10 — End: 2018-03-14
  Administered 2018-03-10 – 2018-03-14 (×10): 10 mL via INTRAVENOUS

## 2018-03-10 MED ORDER — IPRATROPIUM-ALBUTEROL 0.5-2.5 (3) MG/3ML IN SOLN
RESPIRATORY_TRACT | Status: DC | PRN
Start: 2018-03-10 — End: 2018-03-14

## 2018-03-10 MED ORDER — CEFTRIAXONE SODIUM 2 G IJ SOLR
2 g | INTRAMUSCULAR | Status: DC
Start: 2018-03-10 — End: 2018-03-14
  Administered 2018-03-10 – 2018-03-13 (×4): 2 g via INTRAVENOUS

## 2018-03-10 MED ORDER — VALSARTAN 160 MG PO TABS
160 MG | Freq: Every day | ORAL | Status: DC
Start: 2018-03-10 — End: 2018-03-14
  Administered 2018-03-10 – 2018-03-14 (×5): 320 mg via ORAL

## 2018-03-10 MED ORDER — OXYCODONE HCL 5 MG PO TABS
5 MG | ORAL | Status: DC | PRN
Start: 2018-03-10 — End: 2018-03-14
  Administered 2018-03-10 – 2018-03-14 (×8): 5 mg via ORAL

## 2018-03-10 MED ORDER — NEBIVOLOL HCL 5 MG PO TABS
5 MG | Freq: Every day | ORAL | Status: DC
Start: 2018-03-10 — End: 2018-03-14
  Administered 2018-03-10 – 2018-03-14 (×5): 10 mg via ORAL

## 2018-03-10 MED ORDER — GLUCAGON HCL RDNA (DIAGNOSTIC) 1 MG IJ SOLR
1 MG | INTRAMUSCULAR | Status: DC | PRN
Start: 2018-03-10 — End: 2018-03-14

## 2018-03-10 MED FILL — ONDANSETRON HCL 4 MG/2ML IJ SOLN: 4 MG/2ML | INTRAMUSCULAR | Qty: 2

## 2018-03-10 MED FILL — CEFTRIAXONE SODIUM 2 G IJ SOLR: 2 g | INTRAMUSCULAR | Qty: 2

## 2018-03-10 MED FILL — ZOLPIDEM TARTRATE 5 MG PO TABS: 5 mg | ORAL | Qty: 1

## 2018-03-10 MED FILL — HUMALOG KWIKPEN 100 UNIT/ML SC SOPN: 100 [IU]/mL | SUBCUTANEOUS | Qty: 3

## 2018-03-10 MED FILL — FUROSEMIDE 40 MG PO TABS: 40 mg | ORAL | Qty: 2

## 2018-03-10 MED FILL — OSELTAMIVIR PHOSPHATE 75 MG PO CAPS: 75 mg | ORAL | Qty: 1

## 2018-03-10 MED FILL — FAMOTIDINE 20 MG PO TABS: 20 mg | ORAL | Qty: 1

## 2018-03-10 MED FILL — PROAIR HFA 108 (90 BASE) MCG/ACT IN AERS: 108 (90 Base) MCG/ACT | RESPIRATORY_TRACT | Qty: 8.5

## 2018-03-10 MED FILL — OXYCODONE HCL 5 MG PO TABS: 5 mg | ORAL | Qty: 1

## 2018-03-10 MED FILL — BYSTOLIC 5 MG PO TABS: 5 mg | ORAL | Qty: 2

## 2018-03-10 MED FILL — VALSARTAN 160 MG PO TABS: 160 mg | ORAL | Qty: 2

## 2018-03-10 MED FILL — ELIQUIS 5 MG PO TABS: 5 mg | ORAL | Qty: 1

## 2018-03-10 MED FILL — ISOSORBIDE MONONITRATE ER 60 MG PO TB24: 60 mg | ORAL | Qty: 1

## 2018-03-10 MED FILL — IPRATROPIUM-ALBUTEROL 0.5-2.5 (3) MG/3ML IN SOLN: 0.5-2.5 (3) MG/3ML | RESPIRATORY_TRACT | Qty: 3

## 2018-03-10 MED FILL — POTASSIUM CHLORIDE CRYS ER 20 MEQ PO TBCR: 20 meq | ORAL | Qty: 1

## 2018-03-10 MED FILL — MELATONIN 3 MG PO TABS: 3 mg | ORAL | Qty: 1

## 2018-03-10 NOTE — Plan of Care (Signed)
Problem: Falls - Risk of:  Goal: Will remain free from falls  Description  Will remain free from falls  Note:   Bed locked in lowest position and 2/4 rails up. Nonskid socks on. Call light and belongings within reach.   Instructed pt to call out for needs. Pt verbalized understanding. Will continue to monitor.       Problem: Pain:  Goal: Pain level will decrease  Description  Pain level will decrease  Note:   Pt c/o pain.Pt assessed for breathing and BP. Pt educated on pain scale. Medication administered according to doctors orders.  Pt repositioned. Stimuli reduced. Rest promoted.heat applied. Pain reassessed. Will continue to monitor.      Problem: Cardiovascular  Goal: No DVT, peripheral vascular complications  Note:   Pt on telemetry; rate and rhythm monitor: aFib. ECHO performed. Pt denies dizziness, CP, and palpitations. Pt instructed to notify nurse if symptoms occur. Will continue to monitor.      Problem: Infection, Septic Shock:  Goal: Will show no infection signs and symptoms  Description  Will show no infection signs and symptoms  Note:   Low grade fever. Postive for influenza. Blood culture postive. Tamiflu and IV ABX administered.     Problem: Serum Glucose Level - Abnormal:  Goal: Ability to maintain appropriate glucose levels will improve  Description  Ability to maintain appropriate glucose levels will improve  Note:   Pt's blood glucose level monitored ACHS. Insulin administered per MAR parameters. Will monitor

## 2018-03-10 NOTE — Progress Notes (Signed)
returned from echo

## 2018-03-10 NOTE — Progress Notes (Signed)
Lab reported positive blood culture (ecoli). MD informed via perfectserve

## 2018-03-10 NOTE — Care Coordination-Inpatient (Signed)
Discharge Planning Assessment  RN discharge planner met with patient/(and family member) to discuss reason for admission, current living situation, and potential needs at the time of discharge    Demographics/Insurance verified Yes    Current type of dwelling: single level home with no steps to enter    Patient from ECF/SW confirmed with:n/a    Living arrangements:with husband    Level of function/Support: patient s/p recent total hip replacement, is ambulatory without assistive devices and able to perform her ADL    PCP: [facility]    Last Visit to PCP: many years ago, ha an appointment for next week    DME: not at present    Active with any community resources/agencies/skilled home care:no    Medication compliance issues:none reported    Financial issues that could impact healthcare:none    Transportation at the time of discharge: husband    Tentative discharge plan:home    Azucena Kuba, BSN, CCM, RN  [facility]  [phone]

## 2018-03-10 NOTE — Progress Notes (Signed)
Pt to echo

## 2018-03-10 NOTE — Progress Notes (Addendum)
Occupational/Physiclal Therapy  Antionette Poleseborah A Eggebrecht    Attempted to see pt for initial OT evaluation.  RN requested to hold until this afternoon as pt is nauseous and was just provided with antinausea medication.  Will attempt again later as schedule allows.   Thank you.    Karl PockMarcia Donnis Pecha, OTR/L, [id number]  Shelton SilvasAnthony Miles, PT, [phone]    2nd attempt to see pt for OT/PT eval. Pt currently OOF for echo. Will attempt again later today or tomorrow as schedule allows. Thank you. Karl PockMarcia Shermaine Brigham, OTR/L, [phone], Shelton SilvasAnthony Miles, PT, [phone]

## 2018-03-10 NOTE — Progress Notes (Signed)
Hospitalist Progress Note      PCP: Lubertha South, MD    Date of Admission: 03/09/2018    Chief Complaint: has the flu, chills for about a week    Hospital Course:     Presented with aches pains and fatigue, noted to have the flu. Tamiflu  Blood culture today came back positive for E.coli  Started on IV rocephin.       Subjective:  Feels a little better today      Medications:  Reviewed    Infusion Medications   ??? dextrose       Scheduled Medications   ??? cefTRIAXone (ROCEPHIN) IV  2 g Intravenous Q24H   ??? atorvastatin  40 mg Oral Nightly   ??? furosemide  80 mg Oral BID   ??? isosorbide mononitrate  60 mg Oral Daily   ??? nebivolol  10 mg Oral Daily   ??? potassium chloride  20 mEq Oral Daily   ??? valsartan  320 mg Oral Daily   ??? sodium chloride flush  10 mL Intravenous 2 times per day   ??? famotidine  20 mg Oral BID   ??? oseltamivir  75 mg Oral BID   ??? apixaban  5 mg Oral BID   ??? furosemide  80 mg Oral Once   ??? ipratropium-albuterol  1 ampule Inhalation Q6H   ??? insulin lispro  0-6 Units Subcutaneous TID WC   ??? insulin lispro  0-3 Units Subcutaneous Nightly     PRN Meds: zolpidem, oxyCODONE, albuterol sulfate HFA, sodium chloride flush, magnesium hydroxide, ondansetron, potassium chloride **OR** potassium alternative oral replacement **OR** potassium chloride, magnesium sulfate, acetaminophen, perflutren lipid microspheres, ipratropium-albuterol, glucose, dextrose, glucagon (rDNA), dextrose, melatonin      Intake/Output Summary (Last 24 hours) at 03/10/2018 1822  Last data filed at 03/10/2018 1315  Gross per 24 hour   Intake 1120 ml   Output --   Net 1120 ml       Physical Exam Performed:    BP 111/74    Pulse 108    Temp 99.6 ??F (37.6 ??C) (Oral)    Resp 20    Ht 5\' 3"  (1.6 m)    Wt 234 lb 9.1 oz (106.4 kg)    LMP  (Exact Date)    SpO2 96%    BMI 41.55 kg/m??     General appearance: No apparent distress, appears stated age and cooperative.  HEENT: Pupils equal, round, and reactive to light. Conjunctivae/corneas  clear.  Neck: Supple, with full range of motion. No jugular venous distention. Trachea midline.  Respiratory:  Normal respiratory effort. Clear to auscultation, bilaterally without Rales/Wheezes/Rhonchi.  Cardiovascular: Regular rate and rhythm with normal S1/S2 without murmurs, rubs or gallops.  Abdomen: Soft, non-tender, non-distended with normal bowel sounds.  Musculoskeletal: No clubbing, cyanosis or edema bilaterally.  Full range of motion without deformity.  Skin: Skin color, texture, turgor normal.  No rashes or lesions.  Neurologic:  Neurovascularly intact without any focal sensory/motor deficits. Cranial nerves: II-XII intact, grossly non-focal.  Psychiatric: Alert and oriented, thought content appropriate, normal insight  Capillary Refill: Brisk,< 3 seconds   Peripheral Pulses: +2 palpable, equal bilaterally       Labs:   Recent Labs     03/09/18  1729 03/10/18  0457   WBC 6.1 19.2*   HGB 9.0* 9.4*   HCT 29.4*   29.5* 31.6*   PLT 216 200     Recent Labs     03/09/18  1729  03/10/18  0457   NA 146* 143   K 3.8 3.9   CL 106 107   CO2 27 21   BUN 26* 26*   CREATININE 1.0 1.2   CALCIUM 9.2 9.2     Recent Labs     03/09/18  1729 03/10/18  0457   AST 73* 165*   ALT 36 91*   BILITOT 1.7* 3.1*   ALKPHOS 177* 209*     No results for input(s): INR in the last 72 hours.  Recent Labs     03/09/18  1729   TROPONINI <0.01       Urinalysis:      Lab Results   Component Value Date    NITRU Negative 03/10/2018    WBCUA 1 03/10/2018    RBCUA 0 03/10/2018    BLOODU Negative 03/10/2018    SPECGRAV 1.019 03/10/2018    GLUCOSEU Negative 03/10/2018       Radiology:  CT Chest Pulmonary Embolism W Contrast   Final Result   No evidence of pulmonary embolism to the segmental level.      At least moderate cardiomegaly, with prominence of the right-sided chambers   as above.         XR CHEST STANDARD (2 VW)   Final Result   Enlargement of the cardiac silhouette may represent a pericardial effusion.   Otherwise, no acute abnormalities  seen in the chest                 Assessment/Plan:    Active Hospital Problems    Diagnosis   ??? Influenza [J11.1]   ??? Influenza A [J10.1]   ??? Essential hypertension [I10]       Influenza A  - tamiflu, supportive care.    Bacteremia  - e.coli  - rocpehin  - UA    Leukocytosis  - likely combination of flu and bacteremia  -monitor.    Acute Respiratory failure with Hypoxemia  d/t above [ Present on arrival/admission ] : Now on Nasal Cannula O2  => Will attempt to  Titrate O2 down as tolerated by patient. Wean per protocol to maintain Saturation > 92 . CTPA done in ED and shows no PE and no PNA      Asthma ? @ baseline . Nebs prn /sch     Essential Hypertension . Chronic condition. Monitor/Manage/Adjust medications, while in house prn. For now, resumed home medications of Imdur + BB + ARBs     A. FIB . Chronic condition. Monitor/Manage/Adjust medications, while in house prn. For now, resumed home medications of Eliquis and   BB . Rate controlled and ST w/PVCs now     Mixed Hyperlipidemia . Chronic condition. Monitor/Manage/Adjust medications, while in house prn. For now, resumed home medications of statins and Zetia     CAD , s/p PCI in past . Chronic condition. Monitor/Manage/Adjust medications, while in house prn. For now, resumed home medications of Statins and BB and Nitrates     Hyperglycemia , ? 2/2 Borderline DM. Inpatient diet as per orders. While inpatient, will closely monitor blood sugars and medicate with insulin per orders. Will adjust medications while IP for optimal control of symptoms/BS , as needed, depending on Blood sugars progression while in house.  Rx of Hypoglycemia prn , per order sets. A1c check in AM     ANEMIA, ? 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 Is on Eliquis BID @ Home . Check stool occ . Will Check iron studies . Check B12/Folate levels .  =>  Will f/u trend during inpatient stay and monitor closely. Consider hematology f/u as OP   OSA on CPAP @ HS     Insomnia . Melatonin prn   ??    DVT Prophylaxis:  ELIQUIS  Diet: DIET CARDIAC;  Code Status: Full Code    PT/OT Eval Status: EVAL AND TREAT     Dispo - ccc     Adan Sis, MD

## 2018-03-10 NOTE — Progress Notes (Signed)
Patient is refusing the v60 right now but says if her SATS drop she will try a nasal mask. She is on 2L nasal cannula and is 99% on that. Will continue to monitor.

## 2018-03-11 LAB — POCT GLUCOSE
POC Glucose: 137 mg/dl — ABNORMAL HIGH (ref 70–99)
POC Glucose: 147 mg/dl — ABNORMAL HIGH (ref 70–99)
POC Glucose: 134 mg/dl — ABNORMAL HIGH (ref 70–99)
POC Glucose: 137 mg/dl — ABNORMAL HIGH (ref 70–99)

## 2018-03-11 LAB — IRON AND TIBC
Iron % Saturation: 4 % — ABNORMAL LOW (ref 15–50)
Iron: 18 ug/dL — ABNORMAL LOW (ref 37–145)
TIBC: 443 ug/dL (ref 260–445)

## 2018-03-11 LAB — CBC
Hematocrit: 26 % — ABNORMAL LOW (ref 36.0–48.0)
Hemoglobin: 8 g/dL — ABNORMAL LOW (ref 12.0–16.0)
MCH: 26.6 pg (ref 26.0–34.0)
MCHC: 30.9 g/dL — ABNORMAL LOW (ref 31.0–36.0)
MCV: 86.1 fL (ref 80.0–100.0)
MPV: 10.3 fL (ref 5.0–10.5)
Platelets: 155 10*3/uL (ref 135–450)
RBC: 3.02 M/uL — ABNORMAL LOW (ref 4.00–5.20)
RDW: 17.9 % — ABNORMAL HIGH (ref 12.4–15.4)
WBC: 12.1 10*3/uL — ABNORMAL HIGH (ref 4.0–11.0)

## 2018-03-11 LAB — BASIC METABOLIC PANEL
Anion Gap: 15 (ref 3–16)
BUN: 26 mg/dL — ABNORMAL HIGH (ref 7–20)
CO2: 23 mmol/L (ref 21–32)
Calcium: 8.7 mg/dL (ref 8.3–10.6)
Chloride: 105 mmol/L (ref 99–110)
Creatinine: 1.4 mg/dL — ABNORMAL HIGH (ref 0.6–1.2)
GFR African American: 45 — AB (ref >60)
GFR Non-African American: 37 — AB (ref >60)
Glucose: 171 mg/dL — ABNORMAL HIGH (ref 70–99)
Potassium: 3.6 mmol/L (ref 3.5–5.1)
Sodium: 143 mmol/L (ref 136–145)

## 2018-03-11 LAB — BRAIN NATRIURETIC PEPTIDE: Pro-BNP: 5033 pg/mL — ABNORMAL HIGH (ref 0–124)

## 2018-03-11 LAB — FERRITIN: Ferritin: 29.9 ng/mL (ref 15.0–150.0)

## 2018-03-11 MED ORDER — ACETAMINOPHEN 500 MG PO TABS
500 MG | Freq: Four times a day (QID) | ORAL | Status: DC | PRN
Start: 2018-03-11 — End: 2018-03-14

## 2018-03-11 MED ORDER — OSELTAMIVIR PHOSPHATE 30 MG PO CAPS
30 MG | Freq: Two times a day (BID) | ORAL | Status: AC
Start: 2018-03-11 — End: 2018-03-14
  Administered 2018-03-12 – 2018-03-14 (×6): 30 mg via ORAL

## 2018-03-11 MED FILL — ONDANSETRON HCL 4 MG/2ML IJ SOLN: 4 MG/2ML | INTRAMUSCULAR | Qty: 2

## 2018-03-11 MED FILL — FAMOTIDINE 20 MG PO TABS: 20 mg | ORAL | Qty: 1

## 2018-03-11 MED FILL — BYSTOLIC 5 MG PO TABS: 5 mg | ORAL | Qty: 2

## 2018-03-11 MED FILL — IPRATROPIUM-ALBUTEROL 0.5-2.5 (3) MG/3ML IN SOLN: 0.5-2.5 (3) MG/3ML | RESPIRATORY_TRACT | Qty: 3

## 2018-03-11 MED FILL — ELIQUIS 5 MG PO TABS: 5 mg | ORAL | Qty: 1

## 2018-03-11 MED FILL — OSELTAMIVIR PHOSPHATE 75 MG PO CAPS: 75 mg | ORAL | Qty: 1

## 2018-03-11 MED FILL — OXYCODONE HCL 5 MG PO TABS: 5 mg | ORAL | Qty: 1

## 2018-03-11 MED FILL — FUROSEMIDE 40 MG PO TABS: 40 mg | ORAL | Qty: 2

## 2018-03-11 MED FILL — ZOLPIDEM TARTRATE 5 MG PO TABS: 5 mg | ORAL | Qty: 1

## 2018-03-11 MED FILL — VALSARTAN 160 MG PO TABS: 160 mg | ORAL | Qty: 2

## 2018-03-11 MED FILL — POTASSIUM CHLORIDE CRYS ER 20 MEQ PO TBCR: 20 meq | ORAL | Qty: 1

## 2018-03-11 MED FILL — ISOSORBIDE MONONITRATE ER 60 MG PO TB24: 60 mg | ORAL | Qty: 1

## 2018-03-11 MED FILL — CEFTRIAXONE SODIUM 2 G IJ SOLR: 2 g | INTRAMUSCULAR | Qty: 2

## 2018-03-11 MED FILL — ATORVASTATIN CALCIUM 40 MG PO TABS: 40 mg | ORAL | Qty: 1

## 2018-03-11 NOTE — Progress Notes (Signed)
Shift assessment complete. VSS. Pt given oxycodone for chronic pain. New bandaid placed to healing right hip incision. Pt states breathing and cough feels better. Call light within reach, pt independent in room. No other needs at this time. Will continue to monitor.

## 2018-03-11 NOTE — Progress Notes (Addendum)
Physical/Occupational Therapy  Antionette Poleseborah A Rusk  PT/OT orders noted and appreciated. Upon arrival, pt sitting up EOB, noticeably uncomfortable. Pt currently declining therapy evals due to reported 10/10 belly/stomachache. Reports RN having given her something recently. PT encouraging pt to participate, but pt continues to decline at this time, requesting therapy attempt later if possible.  Informing RN, Mallory.  Thank you,  Shelton SilvasAnthony Miles, PT, DPT, [phone]  Cleone SlimKyra Gianno Volner OTR/L [phone]

## 2018-03-11 NOTE — Progress Notes (Signed)
Hospitalist Progress Note      PCP: Lubertha South, MD    Date of Admission: 03/09/2018    Chief Complaint: has the flu, chills for about a week    Hospital Course:     Presented with aches pains and fatigue, noted to have the flu. Tamiflu  Blood culture today came back positive for E.coli  Started on IV rocephin.       Subjective:  Feels a little better today      Medications:  Reviewed    Infusion Medications   ??? dextrose       Scheduled Medications   ??? oseltamivir  30 mg Oral BID   ??? cefTRIAXone (ROCEPHIN) IV  2 g Intravenous Q24H   ??? atorvastatin  40 mg Oral Nightly   ??? furosemide  80 mg Oral BID   ??? isosorbide mononitrate  60 mg Oral Daily   ??? nebivolol  10 mg Oral Daily   ??? potassium chloride  20 mEq Oral Daily   ??? valsartan  320 mg Oral Daily   ??? sodium chloride flush  10 mL Intravenous 2 times per day   ??? famotidine  20 mg Oral BID   ??? apixaban  5 mg Oral BID   ??? furosemide  80 mg Oral Once   ??? ipratropium-albuterol  1 ampule Inhalation Q6H   ??? insulin lispro  0-6 Units Subcutaneous TID WC   ??? insulin lispro  0-3 Units Subcutaneous Nightly     PRN Meds: acetaminophen, zolpidem, oxyCODONE, albuterol sulfate HFA, sodium chloride flush, magnesium hydroxide, ondansetron, potassium chloride **OR** potassium alternative oral replacement **OR** potassium chloride, magnesium sulfate, perflutren lipid microspheres, ipratropium-albuterol, glucose, dextrose, glucagon (rDNA), dextrose, melatonin      Intake/Output Summary (Last 24 hours) at 03/11/2018 1825  Last data filed at 03/11/2018 1542  Gross per 24 hour   Intake 1060 ml   Output 600 ml   Net 460 ml       Physical Exam Performed:    BP 113/77    Pulse 88    Temp 98 ??F (36.7 ??C) (Oral)    Resp 18    Ht 5\' 3"  (1.6 m)    Wt 237 lb 10.5 oz (107.8 kg)    LMP  (Exact Date)    SpO2 96%    BMI 42.10 kg/m??     General appearance: No apparent distress, appears stated age and cooperative.  HEENT: Pupils equal, round, and reactive to light. Conjunctivae/corneas  clear.  Neck: Supple, with full range of motion. No jugular venous distention. Trachea midline.  Respiratory:  Normal respiratory effort. Clear to auscultation, bilaterally without Rales/Wheezes/Rhonchi.  Cardiovascular: Regular rate and rhythm with normal S1/S2 without murmurs, rubs or gallops.  Abdomen: Soft, non-tender, non-distended with normal bowel sounds.  Musculoskeletal: No clubbing, cyanosis or edema bilaterally.  Full range of motion without deformity.  Skin: Skin color, texture, turgor normal.  No rashes or lesions.  Neurologic:  Neurovascularly intact without any focal sensory/motor deficits. Cranial nerves: II-XII intact, grossly non-focal.  Psychiatric: Alert and oriented, thought content appropriate, normal insight  Capillary Refill: Brisk,< 3 seconds   Peripheral Pulses: +2 palpable, equal bilaterally       Labs:   Recent Labs     03/09/18  1729 03/10/18  0457 03/11/18  0909   WBC 6.1 19.2* 12.1*   HGB 9.0* 9.4* 8.0*   HCT 29.4*   29.5* 31.6* 26.0*   PLT 216 200 155     Recent  Labs     03/09/18  1729 03/10/18  0457 03/11/18  0909   NA 146* 143 143   K 3.8 3.9 3.6   CL 106 107 105   CO2 27 21 23    BUN 26* 26* 26*   CREATININE 1.0 1.2 1.4*   CALCIUM 9.2 9.2 8.7     Recent Labs     03/09/18  1729 03/10/18  0457   AST 73* 165*   ALT 36 91*   BILITOT 1.7* 3.1*   ALKPHOS 177* 209*     No results for input(s): INR in the last 72 hours.  Recent Labs     03/09/18  1729   TROPONINI <0.01       Urinalysis:      Lab Results   Component Value Date    NITRU Negative 03/10/2018    WBCUA 1 03/10/2018    RBCUA 0 03/10/2018    BLOODU Negative 03/10/2018    SPECGRAV 1.019 03/10/2018    GLUCOSEU Negative 03/10/2018       Radiology:  CT Chest Pulmonary Embolism W Contrast   Final Result   No evidence of pulmonary embolism to the segmental level.      At least moderate cardiomegaly, with prominence of the right-sided chambers   as above.         XR CHEST STANDARD (2 VW)   Final Result   Enlargement of the cardiac  silhouette may represent a pericardial effusion.   Otherwise, no acute abnormalities seen in the chest                 Assessment/Plan:    Active Hospital Problems    Diagnosis   ??? Influenza [J11.1]   ??? Influenza A [J10.1]   ??? Essential hypertension [I10]       Influenza A  - tamiflu, supportive care.    Bacteremia  - e.coli  - rocpehin  - UA    Leukocytosis  - likely combination of flu and bacteremia  -monitor.    Acute Respiratory failure with Hypoxemia  d/t above [ Present on arrival/admission ] : Now on Nasal Cannula O2  => Will attempt to  Titrate O2 down as tolerated by patient. Wean per protocol to maintain Saturation > 92 . CTPA done in ED and shows no PE and no PNA      Asthma ? @ baseline . Nebs prn /sch     Essential Hypertension . Chronic condition. Monitor/Manage/Adjust medications, while in house prn. For now, resumed home medications of Imdur + BB + ARBs     A. FIB . Chronic condition. Monitor/Manage/Adjust medications, while in house prn. For now, resumed home medications of Eliquis and   BB . Rate controlled and ST w/PVCs now     Mixed Hyperlipidemia . Chronic condition. Monitor/Manage/Adjust medications, while in house prn. For now, resumed home medications of statins and Zetia     CAD , s/p PCI in past . Chronic condition. Monitor/Manage/Adjust medications, while in house prn. For now, resumed home medications of Statins and BB and Nitrates     Hyperglycemia , ? 2/2 Borderline DM. Inpatient diet as per orders. While inpatient, will closely monitor blood sugars and medicate with insulin per orders. Will adjust medications while IP for optimal control of symptoms/BS , as needed, depending on Blood sugars progression while in house.  Rx of Hypoglycemia prn , per order sets. A1c check in AM     ANEMIA, ? 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 Is  on Eliquis BID @ Home . Check stool occ . Will Check iron studies . Check B12/Folate levels .  => Will f/u trend during inpatient stay and monitor closely. Consider hematology  f/u as OP   OSA on CPAP @ HS     Insomnia . Melatonin prn   ??    DVT Prophylaxis: ELIQUIS  Diet: DIET CARDIAC;  Code Status: Full Code    PT/OT Eval Status: EVAL AND TREAT     Dispo - ccc     Adan Sis, MD

## 2018-03-11 NOTE — Progress Notes (Signed)
On [place] rounds pt had a temp of 100.88F- Perfect Served doc on call about giving Tylenol because LFT's are elevated. She called me back and said not to treat unless fever is 101.8F or higher. She also dropped the dose of Tylenol to 500 mg q6h PRN.

## 2018-03-11 NOTE — Plan of Care (Signed)
Problem: Falls - Risk of:  Goal: Will remain free from falls  Description  Will remain free from falls  03/11/2018 1452 by Stevphen RochesterMallory L Korbyn Vanes, RN  Outcome: Ongoing  Note:   Pt is medium fall risk.   Ambulates safely in room.   Calls appropriately for assistance. Pt will remain free of falls this shift.   Goal: Control of acute pain  Description  Control of acute pain  Outcome: Ongoing  Note:   Pt able to properly identify pain and properly notified RN when necessary.    Pt able to properly use pain scale and rate pain.  Pain being managed with PO medication and pt. States understanding of side effects of pain medications.   Continued education given about stomach pain and POC and pt. States understanding.      Problem: Cardiovascular  Goal: Hemodynamic stability  03/11/2018 1452 by Stevphen RochesterMallory L Dwon Sky, RN  Outcome: Ongoing     Problem: Respiratory  Goal: No pulmonary complications  03/11/2018 1452 by Stevphen RochesterMallory L Carmeline Kowal, RN  Outcome: Ongoing     Problem: Infection, Septic Shock:  Goal: Will show no infection signs and symptoms  Description  Will show no infection signs and symptoms  03/11/2018 1452 by Stevphen RochesterMallory L Tomeshia Pizzi, RN  Outcome: Ongoing

## 2018-03-11 NOTE — Plan of Care (Signed)
Problem: Falls - Risk of:  Goal: Will remain free from falls  Description  Will remain free from falls  03/11/2018 0310 by Kandis Ban, RN  Outcome: Ongoing  Note:   Fall precautions in place- bed in lowest position and locked, bed alarm engaged, non slip socks on. Pt's possessions within reach. Pt instructed to call when needing to get up. Husband also at bedside.      Problem: Pain:  Goal: Pain level will decrease  Description  Pain level will decrease  03/11/2018 0310 by Kandis Ban, RN  Outcome: Ongoing  Note:   Pt had hip surgery early October- having some intermittent pain-Oxy ordered PRN.      Problem: Respiratory  Goal: No pulmonary complications  Outcome: Ongoing  Note:   PT has been on 2 L O2 while in house- when I came in pt didn't have NC in and O2 Sat was 95%.   PT also has home CPAP- assisted pt with set up.      Problem: Infection, Septic Shock:  Goal: Will show no infection signs and symptoms  Description  Will show no infection signs and symptoms  03/11/2018 0310 by Kandis Ban, RN  Outcome: Ongoing  Note:   Pt admitted for Flu and E.Coli in blood- on IV abx q24h and Tamiflu. Has been running low grade fevers all day (per dayshift) MN temp 100.46F- LFT's elevated so contacted doc on call who said to only treat if fever over 101.46F and lowered dose of Tylenol.

## 2018-03-11 NOTE — Progress Notes (Signed)
Patient has home PAP unit, no help was needed with mask.

## 2018-03-11 NOTE — Other (Signed)
Shift assessment compete. VSS. Pt. Is alert and oriented resting comfortably in chair. Pt. Has complaints of stomach cramping and nausea along with increased weakness. Pt. Off oxygen at this time. And no compaints of "chills" or SOB at this time. MD at bedside to discuss POC. POC discussed with patient and husband and both state understanding and is in agreement with plan. Chair alarm engaged. Call light and bedside table within reach.      03/11/18 0856   Vital Signs   Temp 99.3 ??F (37.4 ??C)   Temp Source Oral   Pulse 91   Heart Rate Source Brachial   Resp 18   BP 126/73   BP Location Left upper arm   MAP (mmHg) 90   Patient Position Sitting   Level of Consciousness 0   MEWS Score 1   Patient Currently in Pain Denies   Pain Assessment   Pain Assessment 0-10   Pain Level 0   Non-Pharmaceutical Pain Intervention(s) Declines   Response to Pain Intervention Patient Satisfied   Oxygen Therapy   SpO2 96 %   Pulse Oximeter Device Mode Intermittent   Pulse Oximeter Device Location Finger   O2 Device None (Room air)   Will continue to monitor. Suraj Ramdass L Kadey Mihalic

## 2018-03-12 LAB — CULTURE, BLOOD 2
Culture, Blood 2: POSITIVE
Culture, Blood 2: POSITIVE

## 2018-03-12 LAB — POCT GLUCOSE
POC Glucose: 130 mg/dl — ABNORMAL HIGH (ref 70–99)
POC Glucose: 139 mg/dl — ABNORMAL HIGH (ref 70–99)
POC Glucose: 190 mg/dl — ABNORMAL HIGH (ref 70–99)
POC Glucose: 260 mg/dl — ABNORMAL HIGH (ref 70–99)

## 2018-03-12 LAB — CULTURE BLOOD #1
Blood Culture, Routine: POSITIVE
Blood Culture, Routine: POSITIVE
Organism: DETECTED — AB

## 2018-03-12 MED FILL — FAMOTIDINE 20 MG PO TABS: 20 mg | ORAL | Qty: 1

## 2018-03-12 MED FILL — OSELTAMIVIR PHOSPHATE 30 MG PO CAPS: 30 mg | ORAL | Qty: 1

## 2018-03-12 MED FILL — FUROSEMIDE 40 MG PO TABS: 40 mg | ORAL | Qty: 2

## 2018-03-12 MED FILL — ZOLPIDEM TARTRATE 5 MG PO TABS: 5 mg | ORAL | Qty: 1

## 2018-03-12 MED FILL — ISOSORBIDE MONONITRATE ER 60 MG PO TB24: 60 mg | ORAL | Qty: 1

## 2018-03-12 MED FILL — POTASSIUM CHLORIDE CRYS ER 20 MEQ PO TBCR: 20 meq | ORAL | Qty: 1

## 2018-03-12 MED FILL — ATORVASTATIN CALCIUM 40 MG PO TABS: 40 mg | ORAL | Qty: 1

## 2018-03-12 MED FILL — BYSTOLIC 5 MG PO TABS: 5 mg | ORAL | Qty: 2

## 2018-03-12 MED FILL — CEFTRIAXONE SODIUM 2 G IJ SOLR: 2 g | INTRAMUSCULAR | Qty: 2

## 2018-03-12 MED FILL — ELIQUIS 5 MG PO TABS: 5 mg | ORAL | Qty: 1

## 2018-03-12 MED FILL — IPRATROPIUM-ALBUTEROL 0.5-2.5 (3) MG/3ML IN SOLN: 0.5-2.5 (3) MG/3ML | RESPIRATORY_TRACT | Qty: 3

## 2018-03-12 MED FILL — OXYCODONE HCL 5 MG PO TABS: 5 mg | ORAL | Qty: 1

## 2018-03-12 MED FILL — VALSARTAN 160 MG PO TABS: 160 mg | ORAL | Qty: 2

## 2018-03-12 NOTE — Progress Notes (Addendum)
Occupational Therapy   Occupational Therapy Initial Assessment/discharge  Date: 03/12/2018   Patient Name: Mckenzie Bailey  MRN: [id number]     DOB: Jul 29, 1950    Date of Service: 03/12/2018    Discharge Recommendations:  Mckenzie Bailey scored a 21/24 on the AM-PAC ADL Inpatient form.  At this time, no further OT is recommended upon discharge due to secondary to pt being at baseline.  Recommend patient returns to prior setting with prior services.         OT Equipment Recommendations  Equipment Needed: Yes  Mobility Devices: ADL Assistive Devices  ADL Assistive Devices: Sock-Aid Hard;Long-handled Sponge    Assessment   Assessment: eval and d/c, pt at baseline level of fuctioning. demo's sock aid use for ease of donning socks when husband is not present.   Treatment Diagnosis: influenza A  Prognosis: Good  Decision Making: Low Complexity  History: [place] from [place] in [place] for son's funeral. recent R THA. independent with sponge bathing. staying at son's home at this time   Assistance / Modification: mod I, minA for sock, however did not have reacher for pt to use   Patient Education: eval, POC, discharge  REQUIRES OT FOLLOW UP: No  Activity Tolerance  Activity Tolerance: Patient Tolerated treatment well  Safety Devices  Safety Devices in place: Yes  Type of devices: All fall risk precautions in place;Call light within reach;Left in chair;Nurse notified(med falls, chair alarm left off)  Restraints  Initially in place: No           Patient Diagnosis(es): The primary encounter diagnosis was Influenza A. Diagnoses of Tachycardia, Hypoxia, and Sepsis due to Haemophilus influenzae without acute organ dysfunction [facility]) were also pertinent to this visit.     has a past medical history of Anxiety, Apnea, Arthritis, Asthma, CAD (coronary artery disease), Diabetes mellitus (HCC), Hyperlipidemia, and Hypertension.   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    Treatment Diagnosis: influenza A      Restrictions  Restrictions/Precautions  Restrictions/Precautions: Fall Risk, Isolation(medium, droplet precautions)  Position Activity Restriction  Hip Precautions: No hip flexion > 90 degrees, No ADduction, No hip internal rotation(posterior hip precautions)  Other position/activity restrictions: Mckenzie Bailey is a 67 y.o. female presents to the emergency department chief complaint of chills that started several hours prior to arrival.  She denies any chest pain states that she does have a cough without any specific congestion states that she is been having some shortness of breath and generalized body fatigue symptoms.  She denies nausea denies vomiting denies diarrhea denies abdominal pain.  States that she has had decreased appetite.  She states that she is in town because of the recent death of her son otherwise does plan to stay in town for an extended period of time    Subjective   General  Chart Reviewed: Yes  Patient assessed for rehabilitation services?: Yes  Additional Pertinent Hx: OCt 2nd R THA, [place]'s [place]   Family / Caregiver Present: Yes(husband present)  Referring Practitioner: Bryan Lemma, MD, for d/c planning  Diagnosis: Influenza A, blood infection   Subjective  Subjective: pt seated in chair upon arrival and agreeable to OT eval, denies pain  Patient Currently in Pain: Denies  Vital Signs  Patient Currently in Pain: Denies  Social/Functional History  Social/Functional History  Lives With: Spouse  Type of Home: Apartment  Home  Layout: One level  Home Access: Level entry  Bathroom Shower/Tub: Cabin crew: Tax adviser  ADL Assistance: (sponge bathing )  Homemaking Assistance: Needs assistance  Ambulation Assistance: Independent(uses cane)  Transfer Assistance: Independent  Active Driver: Yes  Additional Comments: posterior hip precautions, pt has sx Oct 2nd. Currently  in [place] secondary to losing son to massive heart attack. pt to be in [facility] for several weeks and plans to return home to [facility]. pt not currently getting PT services. reports she had exercises to complete. denies falls in the last 6 months       Objective   Vision: Impaired  Vision Exceptions: Wears glasses at all times  Hearing: Within functional limits    Orientation  Overall Orientation Status: Within Normal Limits  Observation/Palpation  Posture: Fair  Observation: rounded shoulder, B LE pitting edema  Balance  Sitting Balance: Independent  Standing Balance: Modified independent (with cane)  Standing Balance  Time: pt has mask on and hands washed prior to exiting room into hallway, used cane and walked 70 ft, slower speed  Functional Mobility  Functional - Mobility Device: Cane  Activity: Other  Assist Level: Modified independent   Functional Mobility Comments: SPo2 at 96% post mobility   Toilet Transfers  Toilet Transfers Comments: raised toilet seat  ADL  LE Dressing: Minimal assistance(assist with TED hose, minA for use of sock aid to don socks)  Toileting: (pt declined)  Additional Comments: pt seated in chair for donning socks. reports she takes up to 30 minutes to don socks without assist of husband.   Tone RUE  RUE Tone: Normotonic  Tone LUE  LUE Tone: Normotonic  Coordination  Movements Are Fluid And Coordinated: Yes     Bed mobility  Comment: did not assess, pt in chair at beginning and end of session  Transfers  Sit to stand: Modified independent  Stand to sit: Modified independent     Cognition  Overall Cognitive Status: WNL  Perception  Overall Perceptual Status: WFL     Sensation  Overall Sensation Status: WFL            ice for pain/swelling at end of session       Plan   Plan  Times per week: eval and d/c             AM-PAC Score        AM-PAC Inpatient Daily Activity Raw Score: 21 (03/12/18 1357)  AM-PAC Inpatient ADL T-Scale Score : 44.27 (03/12/18 1357)  ADL Inpatient CMS 0-100% Score: 32.79 (03/12/18  1357)  ADL Inpatient CMS G-Code Modifier : CJ (03/12/18 1357)    Goals  Patient Goals   Patient goals : eval and d/c, pt at baseline level of fuctioning       Therapy Time   Individual Concurrent Group Co-treatment   Time In 1015         Time Out 1053         Minutes 38              Timed Code Treatment Minutes:   23 minutes    Total Treatment Minutes:  38 minutes    Marchia Bond, OTR/L [id number]    Marchia Bond, OT

## 2018-03-12 NOTE — Progress Notes (Signed)
Physical Therapy    Facility/Department: [facility]URSING  Initial Assessment/Discharge Note    NAME: Mckenzie Bailey  DOB: 1951/03/08  MRN: [id number]    Date of Service: 03/12/2018    Discharge Recommendations: Mckenzie Bailey scored a 23/24 on the AM-PAC short mobility form.  At this time, would recommend outpatient therapy given patient s/p THA however, patient declines need and plans to continue exercises prescribed to her by physician.  Recommend patient returns to prior setting with prior services.          PT Equipment Recommendations  Equipment Needed: No    Assessment   Body structures, Functions, Activity limitations: Decreased strength;Decreased functional mobility ;Decreased balance  Assessment: Patient demonstrates RLE weakness and gait deficits typical of post op course following R THA.  She denies need for formal therapy and plans on resuming exercises provided to her by her orthopedic surgeon.  Patient would benefit from outpatient therapy services given current deficits but presently declines the need.  Treatment Diagnosis: gait deficits, balance deficits, RLE weakness  Prognosis: Good  Decision Making: Low Complexity  History: DM, HL, HTN, OA, asthma, anxiety, CAD, R THA  Clinical Presentation: Stable.  PT Education: PT Role;Plan of Care;Home Exercise Program;Goals;Gait Training;Equipment;Building services engineer;Injury Prevention  Patient Education: hip precautions  Barriers to Learning: None evident.  No Skilled PT: Safe to return home  REQUIRES PT FOLLOW UP: No  Activity Tolerance  Activity Tolerance: Patient Tolerated treatment well       Patient Diagnosis(es): The primary encounter diagnosis was Influenza A. Diagnoses of Tachycardia, Hypoxia, and Sepsis due to Haemophilus influenzae without acute organ dysfunction [facility]) were also pertinent to this visit.     has a past medical history of Anxiety, Apnea, Arthritis, Asthma, CAD (coronary artery disease), Diabetes mellitus (HCC),  Hyperlipidemia, and Hypertension.   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    Restrictions  Restrictions/Precautions  Restrictions/Precautions: Fall Risk, Isolation  Position Activity Restriction  Hip Precautions: No hip flexion > 90 degrees, No ADduction, No hip internal rotation  Other position/activity restrictions: BEAUTIFULL RIESBERG is a 67 y.o. female presents to the emergency department chief complaint of chills that started several hours prior to arrival.  She denies any chest pain states that she does have a cough without any specific congestion states that she is been having some shortness of breath and generalized body fatigue symptoms.  She denies nausea denies vomiting denies diarrhea denies abdominal pain.  States that she has had decreased appetite.  She states that she is in town because of the recent death of her son otherwise does plan to stay in town for an extended period of time  Vision/Hearing        Subjective  General  Chart Reviewed: Yes  Patient assessed for rehabilitation services?: Yes  Additional Pertinent Hx: DM, HL, HTN, OA, asthma, anxiety, CAD, R THA (02/2018), L mastectomy, coronary angioplasty  Response To Previous Treatment: Not applicable  Family / Caregiver Present: Yes(spouse)  Follows Commands: Within Functional Limits  General Comment  Comments: Patient seated in chair w/ spouse present.  Subjective  Subjective: Patient reports she had R THA recently but hasn't had formal therapy.  She was given exercises to do at home by surgeon.  She states she feels she is improving with those exercises.  She has been ambulating with a cane at home.  Pain Screening  Patient Currently in Pain:  Denies  Vital Signs  Patient Currently in Pain: Denies       Orientation  Orientation  Overall Orientation Status: Within Normal Limits  Social/Functional History  Social/Functional  History  Lives With: Spouse  Type of Home: Apartment  Home Layout: One level  Home Access: Level entry  Bathroom Shower/Tub: Cabin crew: Tax adviser  ADL Assistance: (sponge bathing )  Homemaking Assistance: Needs assistance  Ambulation Assistance: Independent(uses cane)  Transfer Assistance: Independent  Active Driver: Yes  Additional Comments: posterior hip precautions, pt has sx Oct 2nd. Currently in [place] secondary to losing son to massive heart attack. pt to be in [facility] for several weeks and plans to return home to [facility]. pt not currently getting PT services. reports she had exercises to complete. denies falls in the last 6 months  Cognition        Objective     Observation/Palpation  Posture: Fair  Edema: BLE pitting edema    AROM RLE (degrees)  RLE AROM: WNL  AROM LLE (degrees)  LLE AROM : WNL  Strength RLE  Strength RLE: WNL  Comment: Not formally tested given recent surgery but functionally WNL.  Strength LLE  Strength LLE: WFL     Sensation  Overall Sensation Status: WFL     Transfers  Sit to Stand: Modified independent  Stand to sit: Modified independent  Bed to Chair: Modified independent  Stand Pivot Transfers: Modified independent  Ambulation  Ambulation?: Yes  Ambulation 1  Surface: level tile  Device: Single point cane  Assistance: Modified Independent  Gait Deviations: Slow Cadence  Distance: 80'     Balance  Posture: Fair  Sitting - Static: Good  Sitting - Dynamic: Good  Standing - Static: Good  Standing - Dynamic: Fair;+        Plan   Safety Devices  Type of devices: Call light within reach, Left in chair, Gait belt(Spouse present.  Moderate fall risk per Lattie Corns.)    G-Code       OutComes Score                                                  AM-PAC Score  AM-PAC Inpatient Mobility Raw Score : 23 (03/12/18 1435)  AM-PAC Inpatient T-Scale Score : 56.93 (03/12/18 1435)  Mobility Inpatient CMS 0-100% Score: 11.2 (03/12/18 1435)  Mobility Inpatient CMS G-Code Modifier : CI (03/12/18  1435)          Goals          Therapy Time   Individual Concurrent Group Co-treatment   Time In 1015         Time Out 1056         Minutes 41         Timed Code Treatment Minutes: [address] Minutes       Pauline Aus, [place], DPT, ATC-R [phone]

## 2018-03-12 NOTE — Plan of Care (Signed)
Problem: Falls - Risk of:  Goal: Will remain free from falls  Description  Will remain free from falls  Outcome: Ongoing     Problem: Pain:  Goal: Pain level will decrease  Description  Pain level will decrease  Outcome: Ongoing  Goal: Control of chronic pain  Description  Control of chronic pain  Outcome: Ongoing     Problem: Respiratory  Goal: No pulmonary complications  Outcome: Ongoing     Problem: Infection, Septic Shock:  Goal: Will show no infection signs and symptoms  Description  Will show no infection signs and symptoms  Outcome: Ongoing

## 2018-03-12 NOTE — Progress Notes (Signed)
Assessment completed. Please see flowsheets. Pt resting quietly in chair. No needs stated at this time. Family at bedside. Call light within reach. Will continue to monitor

## 2018-03-12 NOTE — Plan of Care (Signed)
Problem: Falls - Risk of:  Goal: Will remain free from falls  Description  Will remain free from falls  03/12/2018 0206 by Talmadge Chad, RN  Outcome: Ongoing  03/11/2018 1452 by Stevphen Rochester, RN  Outcome: Ongoing  Note:   Pt is medium fall risk.   Ambulates safely in room.   Calls appropriately for assistance. Pt will remain free of falls this shift.        Problem: Pain:  Goal: Control of acute pain  Description  Control of acute pain  03/11/2018 1452 by Stevphen Rochester, RN  Outcome: Ongoing  Note:   Pt able to properly identify pain and properly notified RN when necessary.    Pt able to properly use pain scale and rate pain.  Pain being managed with PO medication and pt. States understanding of side effects of pain medications.   Continued education given about stomach pain and POC and pt. States understanding.   Goal: Control of chronic pain  Description  Control of chronic pain  Outcome: Ongoing     Problem: Cardiovascular  Goal: Hemodynamic stability  03/11/2018 1452 by Stevphen Rochester, RN  Outcome: Ongoing     Problem: Respiratory  Goal: No pulmonary complications  03/11/2018 1452 by Stevphen Rochester, RN  Outcome: Ongoing     Problem: GI  Goal: No bowel complications  Outcome: Ongoing     Problem: Infection, Septic Shock:  Goal: Will show no infection signs and symptoms  Description  Will show no infection signs and symptoms  03/11/2018 1452 by Stevphen Rochester, RN  Outcome: Ongoing

## 2018-03-12 NOTE — Progress Notes (Signed)
Shift assessment complete. VSS. Pt given Oxycodone for chronic pain. BLE +3 edema, pt took off ted hose for bedtime. Hat in toilet to monitor UOP. Call light within reach, pt independent. Will continue to monitor.

## 2018-03-12 NOTE — Progress Notes (Signed)
Hospitalist Progress Note      PCP: Lubertha South, MD    Date of Admission: 03/09/2018    Chief Complaint: has the flu, chills for about a week    Hospital Course:     Presented with aches pains and fatigue, noted to have the flu. Tamiflu  Blood culture today came back positive for E.coli  Started on IV rocephin.       Subjective:  Feels a little better today      Medications:  Reviewed    Infusion Medications   ??? dextrose       Scheduled Medications   ??? oseltamivir  30 mg Oral BID   ??? cefTRIAXone (ROCEPHIN) IV  2 g Intravenous Q24H   ??? atorvastatin  40 mg Oral Nightly   ??? furosemide  80 mg Oral BID   ??? isosorbide mononitrate  60 mg Oral Daily   ??? nebivolol  10 mg Oral Daily   ??? potassium chloride  20 mEq Oral Daily   ??? valsartan  320 mg Oral Daily   ??? sodium chloride flush  10 mL Intravenous 2 times per day   ??? famotidine  20 mg Oral BID   ??? apixaban  5 mg Oral BID   ??? furosemide  80 mg Oral Once   ??? ipratropium-albuterol  1 ampule Inhalation Q6H   ??? insulin lispro  0-6 Units Subcutaneous TID WC   ??? insulin lispro  0-3 Units Subcutaneous Nightly     PRN Meds: acetaminophen, zolpidem, oxyCODONE, albuterol sulfate HFA, sodium chloride flush, magnesium hydroxide, ondansetron, potassium chloride **OR** potassium alternative oral replacement **OR** potassium chloride, magnesium sulfate, perflutren lipid microspheres, ipratropium-albuterol, glucose, dextrose, glucagon (rDNA), dextrose, melatonin    No intake or output data in the 24 hours ending 03/12/18 1759    Physical Exam Performed:    BP 104/69    Pulse 69    Temp 96.8 ??F (36 ??C) (Temporal)    Resp 16    Ht 5\' 3"  (1.6 m)    Wt 239 lb 9.6 oz (108.7 kg)    LMP  (Exact Date)    SpO2 96%    BMI 42.44 kg/m??     General appearance: No apparent distress, appears stated age and cooperative.  HEENT: Pupils equal, round, and reactive to light. Conjunctivae/corneas clear.  Neck: Supple, with full range of motion. No jugular venous distention. Trachea  midline.  Respiratory:  Normal respiratory effort. Clear to auscultation, bilaterally without Rales/Wheezes/Rhonchi.  Cardiovascular: Regular rate and rhythm with normal S1/S2 without murmurs, rubs or gallops.  Abdomen: Soft, non-tender, non-distended with normal bowel sounds.  Musculoskeletal: No clubbing, cyanosis or edema bilaterally.  Full range of motion without deformity.  Skin: Skin color, texture, turgor normal.  No rashes or lesions.  Neurologic:  Neurovascularly intact without any focal sensory/motor deficits. Cranial nerves: II-XII intact, grossly non-focal.  Psychiatric: Alert and oriented, thought content appropriate, normal insight  Capillary Refill: Brisk,< 3 seconds   Peripheral Pulses: +2 palpable, equal bilaterally       Labs:   Recent Labs     03/10/18  0457 03/11/18  0909   WBC 19.2* 12.1*   HGB 9.4* 8.0*   HCT 31.6* 26.0*   PLT 200 155     Recent Labs     03/10/18  0457 03/11/18  0909   NA 143 143   K 3.9 3.6   CL 107 105   CO2 21 23   BUN 26* 26*  CREATININE 1.2 1.4*   CALCIUM 9.2 8.7     Recent Labs     03/10/18  0457   AST 165*   ALT 91*   BILITOT 3.1*   ALKPHOS 209*     No results for input(s): INR in the last 72 hours.  No results for input(s): CKTOTAL, TROPONINI in the last 72 hours.    Urinalysis:      Lab Results   Component Value Date    NITRU Negative 03/10/2018    WBCUA 1 03/10/2018    RBCUA 0 03/10/2018    BLOODU Negative 03/10/2018    SPECGRAV 1.019 03/10/2018    GLUCOSEU Negative 03/10/2018       Radiology:  CT Chest Pulmonary Embolism W Contrast   Final Result   No evidence of pulmonary embolism to the segmental level.      At least moderate cardiomegaly, with prominence of the right-sided chambers   as above.         XR CHEST STANDARD (2 VW)   Final Result   Enlargement of the cardiac silhouette may represent a pericardial effusion.   Otherwise, no acute abnormalities seen in the chest                 Assessment/Plan:    Active Hospital Problems    Diagnosis   ??? Influenza  [J11.1]   ??? Influenza A [J10.1]   ??? Essential hypertension [I10]       Influenza A  - tamiflu, supportive care.    Bacteremia  - e.coli  - rocpehin  - UA    Leukocytosis  - likely combination of flu and bacteremia  -monitor.    Acute Respiratory failure with Hypoxemia  d/t above [ Present on arrival/admission ] : Now on Nasal Cannula O2  => Will attempt to  Titrate O2 down as tolerated by patient. Wean per protocol to maintain Saturation > 92 . CTPA done in ED and shows no PE and no PNA      Asthma ? @ baseline . Nebs prn /sch     Essential Hypertension . Chronic condition. Monitor/Manage/Adjust medications, while in house prn. For now, resumed home medications of Imdur + BB + ARBs     A. FIB . Chronic condition. Monitor/Manage/Adjust medications, while in house prn. For now, resumed home medications of Eliquis and   BB . Rate controlled and ST w/PVCs now     Mixed Hyperlipidemia . Chronic condition. Monitor/Manage/Adjust medications, while in house prn. For now, resumed home medications of statins and Zetia     CAD , s/p PCI in past . Chronic condition. Monitor/Manage/Adjust medications, while in house prn. For now, resumed home medications of Statins and BB and Nitrates     Hyperglycemia , ? 2/2 Borderline DM. Inpatient diet as per orders. While inpatient, will closely monitor blood sugars and medicate with insulin per orders. Will adjust medications while IP for optimal control of symptoms/BS , as needed, depending on Blood sugars progression while in house.  Rx of Hypoglycemia prn , per order sets. A1c check in AM     ANEMIA, ? 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 Is on Eliquis BID @ Home . Check stool occ . Will Check iron studies . Check B12/Folate levels .  => Will f/u trend during inpatient stay and monitor closely. Consider hematology f/u as OP   OSA on CPAP @ HS     Insomnia . Melatonin prn   ??    DVT Prophylaxis: ELIQUIS  Diet: DIET CARDIAC;  Code Status: Full Code    PT/OT Eval Status: EVAL AND TREAT     Dispo - ccc      Adan Sis, MD

## 2018-03-13 LAB — POCT GLUCOSE
POC Glucose: 161 mg/dl — ABNORMAL HIGH (ref 70–99)
POC Glucose: 92 mg/dl (ref 70–99)
POC Glucose: 140 mg/dl — ABNORMAL HIGH (ref 70–99)
POC Glucose: 133 mg/dl — ABNORMAL HIGH (ref 70–99)

## 2018-03-13 LAB — BASIC METABOLIC PANEL
Anion Gap: 15 (ref 3–16)
BUN: 33 mg/dL — ABNORMAL HIGH (ref 7–20)
CO2: 23 mmol/L (ref 21–32)
Calcium: 9.2 mg/dL (ref 8.3–10.6)
Chloride: 102 mmol/L (ref 99–110)
Creatinine: 1.5 mg/dL — ABNORMAL HIGH (ref 0.6–1.2)
GFR African American: 42 — AB (ref >60)
GFR Non-African American: 34 — AB (ref >60)
Glucose: 96 mg/dL (ref 70–99)
Potassium: 3.5 mmol/L (ref 3.5–5.1)
Sodium: 140 mmol/L (ref 136–145)

## 2018-03-13 LAB — MAGNESIUM: Magnesium: 1.8 mg/dL (ref 1.80–2.40)

## 2018-03-13 MED ORDER — FUROSEMIDE 10 MG/ML IJ SOLN
10 MG/ML | Freq: Two times a day (BID) | INTRAMUSCULAR | Status: DC
Start: 2018-03-13 — End: 2018-03-14
  Administered 2018-03-13 – 2018-03-14 (×3): 40 mg via INTRAVENOUS

## 2018-03-13 MED FILL — IPRATROPIUM-ALBUTEROL 0.5-2.5 (3) MG/3ML IN SOLN: 0.5-2.5 (3) MG/3ML | RESPIRATORY_TRACT | Qty: 3

## 2018-03-13 MED FILL — FAMOTIDINE 20 MG PO TABS: 20 mg | ORAL | Qty: 1

## 2018-03-13 MED FILL — OSELTAMIVIR PHOSPHATE 30 MG PO CAPS: 30 mg | ORAL | Qty: 1

## 2018-03-13 MED FILL — BYSTOLIC 5 MG PO TABS: 5 mg | ORAL | Qty: 2

## 2018-03-13 MED FILL — ELIQUIS 5 MG PO TABS: 5 mg | ORAL | Qty: 1

## 2018-03-13 MED FILL — ZOLPIDEM TARTRATE 5 MG PO TABS: 5 mg | ORAL | Qty: 1

## 2018-03-13 MED FILL — OXYCODONE HCL 5 MG PO TABS: 5 mg | ORAL | Qty: 1

## 2018-03-13 MED FILL — FUROSEMIDE 40 MG PO TABS: 40 mg | ORAL | Qty: 2

## 2018-03-13 MED FILL — FUROSEMIDE 10 MG/ML IJ SOLN: 10 mg/mL | INTRAMUSCULAR | Qty: 4

## 2018-03-13 MED FILL — ATORVASTATIN CALCIUM 40 MG PO TABS: 40 mg | ORAL | Qty: 1

## 2018-03-13 MED FILL — ISOSORBIDE MONONITRATE ER 60 MG PO TB24: 60 mg | ORAL | Qty: 1

## 2018-03-13 MED FILL — VALSARTAN 160 MG PO TABS: 160 mg | ORAL | Qty: 2

## 2018-03-13 MED FILL — POTASSIUM CHLORIDE CRYS ER 20 MEQ PO TBCR: 20 meq | ORAL | Qty: 1

## 2018-03-13 MED FILL — CEFTRIAXONE SODIUM 2 G IJ SOLR: 2 g | INTRAMUSCULAR | Qty: 2

## 2018-03-13 NOTE — Progress Notes (Signed)
Hospitalist Progress Note      PCP: Lubertha South, MD    Date of Admission: 03/09/2018    Chief Complaint: has the flu, chills for about a week    Hospital Course:     Presented with aches pains and fatigue, noted to have the flu. Tamiflu  Blood culture today came back positive for E.coli  Started on IV rocephin.       Subjective:  Feels a little better today      Medications:  Reviewed    Infusion Medications   ??? dextrose       Scheduled Medications   ??? furosemide  40 mg Intravenous BID   ??? oseltamivir  30 mg Oral BID   ??? cefTRIAXone (ROCEPHIN) IV  2 g Intravenous Q24H   ??? atorvastatin  40 mg Oral Nightly   ??? isosorbide mononitrate  60 mg Oral Daily   ??? nebivolol  10 mg Oral Daily   ??? potassium chloride  20 mEq Oral Daily   ??? valsartan  320 mg Oral Daily   ??? sodium chloride flush  10 mL Intravenous 2 times per day   ??? famotidine  20 mg Oral BID   ??? apixaban  5 mg Oral BID   ??? furosemide  80 mg Oral Once   ??? ipratropium-albuterol  1 ampule Inhalation Q6H   ??? insulin lispro  0-6 Units Subcutaneous TID WC   ??? insulin lispro  0-3 Units Subcutaneous Nightly     PRN Meds: acetaminophen, zolpidem, oxyCODONE, albuterol sulfate HFA, sodium chloride flush, magnesium hydroxide, ondansetron, potassium chloride **OR** potassium alternative oral replacement **OR** potassium chloride, magnesium sulfate, perflutren lipid microspheres, ipratropium-albuterol, glucose, dextrose, glucagon (rDNA), dextrose, melatonin      Intake/Output Summary (Last 24 hours) at 03/13/2018 1805  Last data filed at 03/13/2018 1343  Gross per 24 hour   Intake 600 ml   Output 1275 ml   Net -675 ml       Physical Exam Performed:    BP 120/71    Pulse 101    Temp 99 ??F (37.2 ??C) (Temporal)    Resp 16    Ht 5\' 3"  (1.6 m)    Wt 238 lb 3.2 oz (108 kg)    LMP  (Exact Date)    SpO2 98%    BMI 42.20 kg/m??     General appearance: No apparent distress, appears stated age and cooperative.  HEENT: Pupils equal, round, and reactive to light. Conjunctivae/corneas  clear.  Neck: Supple, with full range of motion. No jugular venous distention. Trachea midline.  Respiratory:  Normal respiratory effort. Clear to auscultation, bilaterally without Rales/Wheezes/Rhonchi.  Cardiovascular: Regular rate and rhythm with normal S1/S2 without murmurs, rubs or gallops.  Abdomen: Soft, non-tender, non-distended with normal bowel sounds.  Musculoskeletal: No clubbing, cyanosis or edema bilaterally.  Full range of motion without deformity.  Skin: Skin color, texture, turgor normal.  No rashes or lesions.  Neurologic:  Neurovascularly intact without any focal sensory/motor deficits. Cranial nerves: II-XII intact, grossly non-focal.  Psychiatric: Alert and oriented, thought content appropriate, normal insight  Capillary Refill: Brisk,< 3 seconds   Peripheral Pulses: +2 palpable, equal bilaterally       Labs:   Recent Labs     03/11/18  0909   WBC 12.1*   HGB 8.0*   HCT 26.0*   PLT 155     Recent Labs     03/11/18  0909 03/13/18  0529   NA 143 140  K 3.6 3.5   CL 105 102   CO2 23 23   BUN 26* 33*   CREATININE 1.4* 1.5*   CALCIUM 8.7 9.2     No results for input(s): AST, ALT, BILIDIR, BILITOT, ALKPHOS in the last 72 hours.  No results for input(s): INR in the last 72 hours.  No results for input(s): CKTOTAL, TROPONINI in the last 72 hours.    Urinalysis:      Lab Results   Component Value Date    NITRU Negative 03/10/2018    WBCUA 1 03/10/2018    RBCUA 0 03/10/2018    BLOODU Negative 03/10/2018    SPECGRAV 1.019 03/10/2018    GLUCOSEU Negative 03/10/2018       Radiology:  CT Chest Pulmonary Embolism W Contrast   Final Result   No evidence of pulmonary embolism to the segmental level.      At least moderate cardiomegaly, with prominence of the right-sided chambers   as above.         XR CHEST STANDARD (2 VW)   Final Result   Enlargement of the cardiac silhouette may represent a pericardial effusion.   Otherwise, no acute abnormalities seen in the chest                 Assessment/Plan:    Active  Hospital Problems    Diagnosis   ??? Influenza [J11.1]   ??? Influenza A [J10.1]   ??? Essential hypertension [I10]       Influenza A  - tamiflu, supportive care.  -can stop tamiflu    Bacteremia  - e.coli  - rocpehin  - UA  - repeat blood cultures are now growth to date.  -should be able to d/c 10/28 as long as stay negative     Leukocytosis  - likely combination of flu and bacteremia  -monitor.    Acute Respiratory failure with Hypoxemia  d/t above [ Present on arrival/admission ] : Now on Nasal Cannula O2  => Will attempt to  Titrate O2 down as tolerated by patient. Wean per protocol to maintain Saturation > 92 . CTPA done in ED and shows no PE and no PNA      Asthma ? @ baseline . Nebs prn /sch     Essential Hypertension . Chronic condition. Monitor/Manage/Adjust medications, while in house prn. For now, resumed home medications of Imdur + BB + ARBs     A. FIB . Chronic condition. Monitor/Manage/Adjust medications, while in house prn. For now, resumed home medications of Eliquis and   BB . Rate controlled and ST w/PVCs now     Mixed Hyperlipidemia . Chronic condition. Monitor/Manage/Adjust medications, while in house prn. For now, resumed home medications of statins and Zetia     CAD , s/p PCI in past . Chronic condition. Monitor/Manage/Adjust medications, while in house prn. For now, resumed home medications of Statins and BB and Nitrates     Chronic systolic CHF  -patient with EF of 35 %  - on appropriate meds  - not taking them as she should recently.      Hyperglycemia , ? 2/2 Borderline DM. Inpatient diet as per orders. While inpatient, will closely monitor blood sugars and medicate with insulin per orders. Will adjust medications while IP for optimal control of symptoms/BS , as needed, depending on Blood sugars progression while in house.  Rx of Hypoglycemia prn , per order sets. A1c check in AM     ANEMIA, ? 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  Is on Eliquis BID @ Home . Check stool occ . Will Check iron studies . Check  B12/Folate levels .  => Will f/u trend during inpatient stay and monitor closely. Consider hematology f/u as OP   OSA on CPAP @ HS     Insomnia . Melatonin prn   ??    DVT Prophylaxis: ELIQUIS  Diet: DIET CARDIAC;  Code Status: Full Code    PT/OT Eval Status: EVAL AND TREAT     Dispo - anticipate d/c home ( [place]) tomorrow.     Adan Sis, MD

## 2018-03-13 NOTE — Plan of Care (Signed)
Problem: Falls - Risk of:  Goal: Will remain free from falls  Description  Will remain free from falls  Outcome: Ongoing     Problem: Pain:  Goal: Pain level will decrease  Description  Pain level will decrease  Outcome: Ongoing  Note:   Pt able to express presence/absence of pain and rate pain appropriately using numerical scale. Pain/discomfort being managed with PRN analgesics per MD orders (see MAR). Pain assessed per protocol and after interventions.    Goal: Control of chronic pain  Description  Control of chronic pain  Outcome: Ongoing     Problem: Cardiovascular  Goal: Hemodynamic stability  Outcome: Ongoing     Problem: Respiratory  Goal: No pulmonary complications  Outcome: Ongoing     Problem: Infection, Septic Shock:  Goal: Will show no infection signs and symptoms  Description  Will show no infection signs and symptoms  Outcome: Ongoing     Problem: Serum Glucose Level - Abnormal:  Goal: Ability to maintain appropriate glucose levels will improve  Description  Ability to maintain appropriate glucose levels will improve  Outcome: Ongoing

## 2018-03-13 NOTE — Progress Notes (Signed)
Shift assessment complete. VSS. Pt given oxycodone for pain. Pt and husband received education and reinforcement on CHF, husband states pt was going to follow up with cardiologist when they get back to in [place]. Pt had recent Echo there and cardiologist was going to discuss plan. Call light within reach, no needs at this time. Will continue to monitor.

## 2018-03-14 LAB — COMPREHENSIVE METABOLIC PANEL
ALT: 51 U/L — ABNORMAL HIGH (ref 10–40)
AST: 37 U/L (ref 15–37)
Albumin/Globulin Ratio: 0.9 — ABNORMAL LOW (ref 1.1–2.2)
Albumin: 3.1 g/dL — ABNORMAL LOW (ref 3.4–5.0)
Alkaline Phosphatase: 173 U/L — ABNORMAL HIGH (ref 40–129)
Anion Gap: 12 (ref 3–16)
BUN: 29 mg/dL — ABNORMAL HIGH (ref 7–20)
CO2: 24 mmol/L (ref 21–32)
Calcium: 9.2 mg/dL (ref 8.3–10.6)
Chloride: 104 mmol/L (ref 99–110)
Creatinine: 1.5 mg/dL — ABNORMAL HIGH (ref 0.6–1.2)
GFR African American: 42 — AB (ref >60)
GFR Non-African American: 34 — AB (ref >60)
Globulin: 3.4 g/dL
Glucose: 96 mg/dL (ref 70–99)
Potassium: 3.5 mmol/L (ref 3.5–5.1)
Sodium: 140 mmol/L (ref 136–145)
Total Bilirubin: 1.2 mg/dL — ABNORMAL HIGH (ref 0.0–1.0)
Total Protein: 6.5 g/dL (ref 6.4–8.2)

## 2018-03-14 LAB — CBC
Hematocrit: 26 % — ABNORMAL LOW (ref 36.0–48.0)
Hemoglobin: 8 g/dL — ABNORMAL LOW (ref 12.0–16.0)
MCH: 26.3 pg (ref 26.0–34.0)
MCHC: 30.8 g/dL — ABNORMAL LOW (ref 31.0–36.0)
MCV: 85.4 fL (ref 80.0–100.0)
MPV: 10.5 fL (ref 5.0–10.5)
Platelets: 169 10*3/uL (ref 135–450)
RBC: 3.05 M/uL — ABNORMAL LOW (ref 4.00–5.20)
RDW: 18.6 % — ABNORMAL HIGH (ref 12.4–15.4)
WBC: 8.6 10*3/uL (ref 4.0–11.0)

## 2018-03-14 LAB — POCT GLUCOSE
POC Glucose: 150 mg/dl — ABNORMAL HIGH (ref 70–99)
POC Glucose: 111 mg/dl — ABNORMAL HIGH (ref 70–99)
POC Glucose: 147 mg/dl — ABNORMAL HIGH (ref 70–99)

## 2018-03-14 MED ORDER — AMOXICILLIN-POT CLAVULANATE 875-125 MG PO TABS
875-125 MG | ORAL_TABLET | Freq: Two times a day (BID) | ORAL | 0 refills | Status: AC
Start: 2018-03-14 — End: 2018-03-21

## 2018-03-14 MED ORDER — ZOLPIDEM TARTRATE 5 MG PO TABS
5 MG | ORAL_TABLET | Freq: Every evening | ORAL | 0 refills | Status: AC | PRN
Start: 2018-03-14 — End: 2018-03-28

## 2018-03-14 MED ORDER — OSELTAMIVIR PHOSPHATE 30 MG PO CAPS
30 MG | ORAL_CAPSULE | Freq: Two times a day (BID) | ORAL | 0 refills | Status: AC
Start: 2018-03-14 — End: 2018-03-16

## 2018-03-14 MED FILL — BYSTOLIC 5 MG PO TABS: 5 mg | ORAL | Qty: 2

## 2018-03-14 MED FILL — ELIQUIS 5 MG PO TABS: 5 mg | ORAL | Qty: 1

## 2018-03-14 MED FILL — OSELTAMIVIR PHOSPHATE 30 MG PO CAPS: 30 mg | ORAL | Qty: 1

## 2018-03-14 MED FILL — ATORVASTATIN CALCIUM 40 MG PO TABS: 40 mg | ORAL | Qty: 1

## 2018-03-14 MED FILL — IPRATROPIUM-ALBUTEROL 0.5-2.5 (3) MG/3ML IN SOLN: 0.5-2.5 (3) MG/3ML | RESPIRATORY_TRACT | Qty: 3

## 2018-03-14 MED FILL — ISOSORBIDE MONONITRATE ER 60 MG PO TB24: 60 mg | ORAL | Qty: 1

## 2018-03-14 MED FILL — OXYCODONE HCL 5 MG PO TABS: 5 mg | ORAL | Qty: 1

## 2018-03-14 MED FILL — VALSARTAN 160 MG PO TABS: 160 mg | ORAL | Qty: 2

## 2018-03-14 MED FILL — FAMOTIDINE 20 MG PO TABS: 20 mg | ORAL | Qty: 1

## 2018-03-14 MED FILL — POTASSIUM CHLORIDE CRYS ER 20 MEQ PO TBCR: 20 meq | ORAL | Qty: 1

## 2018-03-14 MED FILL — ZOLPIDEM TARTRATE 5 MG PO TABS: 5 mg | ORAL | Qty: 1

## 2018-03-14 MED FILL — FUROSEMIDE 10 MG/ML IJ SOLN: 10 mg/mL | INTRAMUSCULAR | Qty: 4

## 2018-03-14 NOTE — Progress Notes (Signed)
Data- discharge order received, pt verbalized agreement to discharge, disposition to previous residence, no needs for HHC/DME.     Action- discharge instructions prepared and given to patient and husband, pt verbalized understanding. Medication information packet given r/t NEW and/or CHANGED prescriptions emphasizing name/purpose/side effects, pt verbalized understanding. Discharge instruction summary: Diet- Cardiac, Activity- Up as tolerated, Primary Care Physician as follows: Lubertha SouthWilliam R Cox, MD None f/u appointment will be made by husband per husband request due to [place], immunizations reviewed, prescription medications given to patient to be filled at pharmacy of choice per pt. request. CHF Education reviewed. Pt/ Family has had a total of 60 minutes CHF education this admission encounter.     Response- Pt belongings gathered, IV removed. Disposition is home (no HHC/DME needs), transported with husband, taken to lobby via w/c w/ belongings, no complications.     D/C instructions given to patient and reviewed with pt. At bedside including new medications and side effects and pt. States understanding for need for medication. Spoke with husband about filling medications and husband states they will be in town until next week and he will drop medications off to pharmacy of choice. Husband [place] and appointment has already been set up for patient. Husband states that he and his wife have been dealing with CHF for sometime now and understand education. All questions answered. Pt. D/C'd in stable condition. Emmalene Kattner L Severus Brodzinski 2:20 PM

## 2018-03-14 NOTE — Care Coordination-Inpatient (Signed)
Patient discharged 03/14/18 to home.  All discharge needs met per case management.  Azucena Kuba, BSN, CCM, RN  [facility]  [phone]

## 2018-03-14 NOTE — Discharge Summary (Addendum)
Hospital Medicine Discharge Summary    Patient ID: Mckenzie Bailey      Patient's PCP: Lubertha South, MD    Admit Date: 03/09/2018     Discharge Date: 03/14/2018      Admitting Physician: Bryan Lemma, MD     Discharge Physician: Holli Humbles, MD     Discharge Diagnoses:       Active Hospital Problems    Diagnosis   ??? E coli bacteremia [R78.81]   ??? Influenza A [J10.1]   ??? Essential hypertension [I10]       The patient was seen and examined on day of discharge and this discharge summary is in conjunction with any daily progress note from day of discharge.    Hospital Course:   67 year old lady [place] with history of hypertension, hyperlipidemia, type 2 diabetes, CHF, coronary artery disease, anxiety who presented with 1 week history of flu symptoms with associated chills, generalized body aches and fatigue found to have flu and started on Tamiflu.  She had blood cultures done which came back positive for E. coli 2 out of 2 bottles which was pansensitive.  She received IV ceftriaxone for 4 days with symptom improvement.  She will continue on oral Augmentin for 7 to 10 days.    Influenza A: Treated with tamiflu and supportive care.  Will receive 2 more days of tamiflu to complete 5-day therapy  ??  E. coli bacteremia: Unclear source likely UTI.  Treated with IV ceftriaxone transition to Augmentin for 7 more days.  Repeat blood cultures negative to date  ??  Leukocytosis likely combination of flu and bacteremia.  Resolved.  ??  Acute Respiratory failure with Hypoxemia due to her influenza and E. coli bacteremia.  She had negative CT pulmonary angiogram with no pneumonia or PE.  on presentation to ER, she was placed on PAP (positive airway pressure) and could maintain sats at 95% with PAP. she improved after that, but she was hypoxic enough to require PAP in ER which was reason for admission. She was weaned off oxygen and remains on room air.  Resolved.  ??  Essential Hypertension. ??Chronic  condition.  Continue on home medications.  Stable.  ??  A. FIB .??Chronic condition.  Stable will continue on home rate control medication and anticoagulation    Mixed Hyperlipidemia??.?? Stable on??statins and Zetia   ??  CAD , s/p PCI in past .??Chronic condition.  Stable on??satins and BB and Nitrates??  ??  Chronic systolic CHF with EF of 35 %.  Stable and euvolemic.  ??  Insomnia . Melatonin prn??      Physical Exam Performed:     BP 124/80    Pulse 79    Temp 98.5 ??F (36.9 ??C) (Oral)    Resp 16    Ht 5\' 3"  (1.6 m)    Wt 237 lb 9.6 oz (107.8 kg)    LMP  (Exact Date)    SpO2 97%    BMI 42.09 kg/m??       General appearance:  No apparent distress, appears stated age and cooperative.  HEENT:  Normal cephalic, atraumatic without obvious deformity. Pupils equal, round, and reactive to light.  Extra ocular muscles intact. Conjunctivae/corneas clear.  Neck: Supple, with full range of motion. No jugular venous distention. Trachea midline.  Respiratory:  Normal respiratory effort. Clear to auscultation, bilaterally without Rales/Wheezes/Rhonchi.  Cardiovascular:  Regular rate and rhythm with normal S1/S2 without murmurs, rubs or gallops.  Abdomen: Soft, non-tender,  non-distended with normal bowel sounds.  Musculoskeletal:  No clubbing, cyanosis or edema bilaterally.  Full range of motion without deformity.  Skin: Skin color, texture, turgor normal.  No rashes or lesions.  Neurologic:  Neurovascularly intact without any focal sensory/motor deficits. Cranial nerves: II-XII intact, grossly non-focal.  Psychiatric:  Alert and oriented, thought content appropriate, normal insight  Capillary Refill: Brisk,< 3 seconds   Peripheral Pulses: +2 palpable, equal bilaterally       Labs: For convenience and continuity at follow-up the following most recent labs are provided:      CBC:    Lab Results   Component Value Date    WBC 8.6 03/14/2018    HGB 8.0 03/14/2018    HCT 26.0 03/14/2018    PLT 169 03/14/2018       Renal:    Lab Results    Component Value Date    NA 140 03/14/2018    K 3.5 03/14/2018    CL 104 03/14/2018    CO2 24 03/14/2018    BUN 29 03/14/2018    CREATININE 1.5 03/14/2018    CALCIUM 9.2 03/14/2018         Significant Diagnostic Studies    Radiology:   CT Chest Pulmonary Embolism W Contrast   Final Result   No evidence of pulmonary embolism to the segmental level.      At least moderate cardiomegaly, with prominence of the right-sided chambers   as above.         XR CHEST STANDARD (2 VW)   Final Result   Enlargement of the cardiac silhouette may represent a pericardial effusion.   Otherwise, no acute abnormalities seen in the chest                Consults:     IP CONSULT TO HOSPITALIST  IP CONSULT TO HEART FAILURE NURSE/COORDINATOR  IP CONSULT TO FINANCIAL COUNSELOR    Disposition: Home    Condition at Discharge: Stable    Discharge Instructions/Follow-up:    PCP follow-up in 1 to 2 weeks  Cardiology follow-up in 2 weeks in [place]    Code Status: Full code    Activity: activity as tolerated    Diet: regular diet      Discharge Medications:     Discharge Medication List as of 03/14/2018  1:29 PM           Details   zolpidem (AMBIEN) 5 MG tablet Take 1 tablet by mouth nightly as needed for Sleep for up to 14 days., Disp-7 tablet, R-0Print      amoxicillin-clavulanate (AUGMENTIN) 875-125 MG per tablet Take 1 tablet by mouth 2 times daily for 7 days, Disp-14 tablet, R-0Print      oseltamivir (TAMIFLU) 30 MG capsule Take 1 capsule by mouth 2 times daily for 2 days, Disp-4 capsule, R-0Print              Details   albuterol sulfate HFA (PROVENTIL HFA) 108 (90 Base) MCG/ACT inhaler Inhale 90 mcg into the lungs every 6 hours as neededHistorical Med      allopurinol (ZYLOPRIM) 300 MG tablet Take 300 mg by mouth dailyHistorical Med      atorvastatin (LIPITOR) 40 MG tablet Take 40 mg by mouth nightlyHistorical Med      colchicine (MITIGARE) 0.6 MG capsule Take 0.6 mg by mouth dailyHistorical Med      empagliflozin (JARDIANCE) 10 MG  tablet Inject 36 mg as directed Daily with supperHistorical Med  ezetimibe (ZETIA) 10 MG tablet Take 10 mg by mouth dailyHistorical Med      fexofenadine (ALLEGRA) 180 MG tablet Take 180 mg by mouth daily as neededHistorical Med      furosemide (LASIX) 80 MG tablet Take 80 mg by mouth 2 times dailyHistorical Med      isosorbide mononitrate (IMDUR) 60 MG extended release tablet Take 60 mg by mouth dailyHistorical Med      LORazepam (ATIVAN) 1 MG tablet Take 1 mg by mouth as needed.Historical Med      nebivolol (BYSTOLIC) 10 MG tablet Take 10 mg by mouth dailyHistorical Med      potassium chloride (KLOR-CON M20) 20 MEQ extended release tablet Take 20 mEq by mouth dailyHistorical Med      valsartan-hydrochlorothiazide (DIOVAN-HCT) 320-25 MG per tablet Take 320 tablets by mouth dailyHistorical Med      apixaban (ELIQUIS) 5 MG TABS tablet Take 5 mg by mouth 2 times dailyHistorical Med             Time Spent on discharge is more than 30 minutes in the examination, evaluation, counseling and review of medications and discharge plan.      Signed:    Holli Humbles, MD   03/14/2018      Thank you Lubertha South, MD for the opportunity to be involved in this patient's care. If you have any questions or concerns please feel free to contact me at [phone].

## 2018-03-14 NOTE — Other (Signed)
Shift assessment compete. VSS. Pt. Is alert and oriented resting comfortably in chair. Pt. Has no complaints at this time. Pt. States "I feel much better today". Discussed CHF POC and husband states that pt. [place] cardi[place] and a plan has been established. POC discussed with patient and patient states understanding and is in agreement with plan. Call light and bedside table within reach.      03/14/18 0915   Vital Signs   Temp 98 ??F (36.7 ??C)   Temp Source Oral   Pulse 118   Heart Rate Source Brachial   Resp 18   BP 122/78   BP Location Right upper arm   MAP (mmHg) 93   Patient Position Sitting   Level of Consciousness 0   MEWS Score 3   Patient Currently in Pain Denies   Pain Assessment   Pain Assessment 0-10   Pain Level 0   Non-Pharmaceutical Pain Intervention(s) Declines   Response to Pain Intervention Patient Satisfied   Oxygen Therapy   SpO2 100 %   Pulse Oximeter Device Mode Intermittent   Pulse Oximeter Device Location Finger   O2 Device None (Room air)   Will continue to monitor. Glori Machnik L Fitzgerald Dunne

## 2018-03-15 NOTE — Care Coordination-Inpatient (Signed)
[facility] Care Transitions Initial Follow Up Call    Call within 2 business days of discharge: Yes    Patient: Mckenzie Bailey Patient DOB: 1950-07-24   MRN: [id number]  Reason for Admission: Influenza A  Discharge Date: 03/14/18 RARS: Readmission Risk Score: 17      Last Discharge [facility]       Complaint Diagnosis Description Type Department Provider    03/09/18 [place] Influenza A ... ED to Hosp-Admission (Discharged) (ADMITTED) [facility] Bryan Lemma, MD; Kennon Rounds We...           Spoke with: Antionette Poles      Facility: [facility]    Non-face-to-face services provided:  Obtained and reviewed discharge summary and/or continuity of care documents    Care Transitions 24 Hour Call    Do you have any ongoing symptoms?:  No  Do you have a copy of your discharge instructions?:  Yes  Do you have all of your prescriptions and are they filled?:  Yes  Have you been contacted by a [facility] Pharmacist?:  No  Have you scheduled your follow up appointment?:  Yes  How are you going to get to your appointment?:  Car - family or friend to transport  Were you discharged with any Home Care or Post Acute Services:  No  Do you feel like you have everything you need to keep you well at home?:  Yes  Care Transitions Interventions         Follow Up: [facility]-A resides in [place].  Patient's husband reports that patient is feeling better and is in the shower.  Discussed discharge instructions.  He reports that she is afebrile and does not have any SOB.  She has all of her new medications, did have to pay full price for ambien as prior auth was not submitted.  Husband reports that patient will see her PCP tomorrow, CTN encouraged husband to discuss new order and prior auth for Remus Loffler, he verbalized that he would do so.  Husband says that they will retunr to [facility] soon.  CTN will transition to [employer] for folow up.    No future appointments.    Adrian Prows, RN

## 2018-03-17 LAB — CULTURE BLOOD #1: Blood Culture, Routine: NO GROWTH

## 2018-03-17 LAB — CULTURE, BLOOD 2: Culture, Blood 2: NO GROWTH

## 2018-03-21 ENCOUNTER — Other Ambulatory Visit: Payer: Self-pay | Admitting: Pharmacist

## 2018-03-21 MED ORDER — ELIQUIS 5 MG PO TABS: 5.0000 mg | ORAL_TABLET | Freq: Two times a day (BID) | ORAL | 0 refills | Status: DC

## 2018-03-23 ENCOUNTER — Other Ambulatory Visit: Payer: Self-pay | Admitting: Pharmacist

## 2018-03-28 NOTE — Care Coordination-Inpatient (Signed)
[facility] Health Care Transitions Follow Up Call    03/28/2018    Patient: Mckenzie Bailey  Patient DOB: 01-17-1951   MRN: <[id number]>  Reason for Admission:   Discharge Date: 03/14/18 RARS: Readmission Risk Score: 17    Attempted to contact patient for [facility] call. Contact information left to VM requesting call back at the earliest convenience.    Danella Maiers, RN BSN   Care Transitions Nurse  [phone]       Follow Up  No future appointments.    Danella Maiers, RN

## 2018-03-28 NOTE — Care Coordination-Inpatient (Signed)
[facility] Health Care Transitions Follow Up Call    03/28/2018    Patient: Mckenzie Bailey  Patient DOB: [birth year]   MRN: <[id number]>  Reason for Admission:   Discharge Date: 03/14/18 RARS: Readmission Risk Score: 17         Spoke with: Pt's husband Mckenzie Bailey Transitions Subsequent and Final Call    Subsequent and Final Calls  Do you have any ongoing symptoms?:  No  Have your medications changed?:  No  Do you have any questions related to your medications?:  No  Do you currently have any active services?:  No  Do you have any needs or concerns that I can assist you with?:  No  Identified Barriers:  None  Care Transitions Interventions                          Other Interventions:          CTN received call back message from pt's husband. CTN called Windy Fast back and informed pt is feeling much better, has PCP appt on 03/30/18. Stated they were only in town for their eldest son's funeral and are back in [place] where they reside. CTN expressed condolences for loss, wished family well. CTN sign off.    Danella Maiers, RN BSN   Care Transitions Nurse  [phone]     Follow Up  No future appointments.    Danella Maiers, RN

## 2018-04-14 ENCOUNTER — Inpatient Hospital Stay (HOSPITAL_COMMUNITY)
Admission: EM | Admit: 2018-04-14 | Discharge: 2018-04-22 | DRG: 291 | Disposition: A | Discharge status: Home / Self Care | Payer: Medicare Other | Attending: Internal Medicine | Admitting: Internal Medicine

## 2018-04-14 DIAGNOSIS — D649 Anemia, unspecified: Secondary | ICD-10-CM

## 2018-04-14 DIAGNOSIS — F419 Anxiety disorder, unspecified: Secondary | ICD-10-CM | POA: Diagnosis present

## 2018-04-14 DIAGNOSIS — Z9012 Acquired absence of left breast and nipple: Secondary | ICD-10-CM

## 2018-04-14 DIAGNOSIS — I5043 Acute on chronic combined systolic (congestive) and diastolic (congestive) heart failure: Secondary | ICD-10-CM | POA: Diagnosis present

## 2018-04-14 DIAGNOSIS — D638 Anemia in other chronic diseases classified elsewhere: Secondary | ICD-10-CM | POA: Diagnosis present

## 2018-04-14 DIAGNOSIS — Z6841 Body Mass Index (BMI) 40.0 and over, adult: Secondary | ICD-10-CM

## 2018-04-14 DIAGNOSIS — Z794 Long term (current) use of insulin: Secondary | ICD-10-CM

## 2018-04-14 DIAGNOSIS — Z79899 Other long term (current) drug therapy: Secondary | ICD-10-CM

## 2018-04-14 DIAGNOSIS — I130 Hypertensive heart and chronic kidney disease with heart failure and stage 1 through stage 4 chronic kidney disease, or unspecified chronic kidney disease: Secondary | ICD-10-CM | POA: Diagnosis not present

## 2018-04-14 DIAGNOSIS — G4733 Obstructive sleep apnea (adult) (pediatric): Secondary | ICD-10-CM | POA: Diagnosis present

## 2018-04-14 DIAGNOSIS — E1122 Type 2 diabetes mellitus with diabetic chronic kidney disease: Secondary | ICD-10-CM | POA: Diagnosis present

## 2018-04-14 DIAGNOSIS — Z87891 Personal history of nicotine dependence: Secondary | ICD-10-CM

## 2018-04-14 DIAGNOSIS — I2729 Other secondary pulmonary hypertension: Secondary | ICD-10-CM | POA: Diagnosis present

## 2018-04-14 DIAGNOSIS — E785 Hyperlipidemia, unspecified: Secondary | ICD-10-CM | POA: Diagnosis present

## 2018-04-14 DIAGNOSIS — E876 Hypokalemia: Secondary | ICD-10-CM | POA: Diagnosis present

## 2018-04-14 DIAGNOSIS — I5082 Biventricular heart failure: Secondary | ICD-10-CM | POA: Diagnosis present

## 2018-04-14 DIAGNOSIS — M109 Gout, unspecified: Secondary | ICD-10-CM | POA: Diagnosis present

## 2018-04-14 DIAGNOSIS — Z713 Dietary counseling and surveillance: Secondary | ICD-10-CM

## 2018-04-14 DIAGNOSIS — Z7901 Long term (current) use of anticoagulants: Secondary | ICD-10-CM

## 2018-04-14 DIAGNOSIS — Z8249 Family history of ischemic heart disease and other diseases of the circulatory system: Secondary | ICD-10-CM

## 2018-04-14 DIAGNOSIS — D631 Anemia in chronic kidney disease: Secondary | ICD-10-CM | POA: Diagnosis present

## 2018-04-14 DIAGNOSIS — I700 Atherosclerosis of aorta: Secondary | ICD-10-CM | POA: Diagnosis present

## 2018-04-14 DIAGNOSIS — I447 Left bundle-branch block, unspecified: Secondary | ICD-10-CM | POA: Diagnosis present

## 2018-04-14 DIAGNOSIS — K219 Gastro-esophageal reflux disease without esophagitis: Secondary | ICD-10-CM | POA: Diagnosis present

## 2018-04-14 DIAGNOSIS — N183 Chronic kidney disease, stage 3 (moderate): Secondary | ICD-10-CM | POA: Diagnosis present

## 2018-04-14 DIAGNOSIS — I272 Pulmonary hypertension, unspecified: Secondary | ICD-10-CM | POA: Diagnosis not present

## 2018-04-14 DIAGNOSIS — Z955 Presence of coronary angioplasty implant and graft: Secondary | ICD-10-CM

## 2018-04-14 DIAGNOSIS — I10 Essential (primary) hypertension: Secondary | ICD-10-CM | POA: Diagnosis present

## 2018-04-14 DIAGNOSIS — E1169 Type 2 diabetes mellitus with other specified complication: Secondary | ICD-10-CM | POA: Diagnosis present

## 2018-04-14 DIAGNOSIS — Z853 Personal history of malignant neoplasm of breast: Secondary | ICD-10-CM

## 2018-04-14 DIAGNOSIS — E11649 Type 2 diabetes mellitus with hypoglycemia without coma: Secondary | ICD-10-CM | POA: Diagnosis not present

## 2018-04-14 DIAGNOSIS — Z7951 Long term (current) use of inhaled steroids: Secondary | ICD-10-CM

## 2018-04-14 DIAGNOSIS — Z96641 Presence of right artificial hip joint: Secondary | ICD-10-CM | POA: Diagnosis present

## 2018-04-14 DIAGNOSIS — I25119 Atherosclerotic heart disease of native coronary artery with unspecified angina pectoris: Secondary | ICD-10-CM | POA: Diagnosis present

## 2018-04-14 DIAGNOSIS — I248 Other forms of acute ischemic heart disease: Secondary | ICD-10-CM | POA: Diagnosis present

## 2018-04-14 DIAGNOSIS — I420 Dilated cardiomyopathy: Secondary | ICD-10-CM | POA: Diagnosis present

## 2018-04-14 DIAGNOSIS — N179 Acute kidney failure, unspecified: Secondary | ICD-10-CM | POA: Diagnosis present

## 2018-04-14 DIAGNOSIS — E1140 Type 2 diabetes mellitus with diabetic neuropathy, unspecified: Secondary | ICD-10-CM | POA: Diagnosis present

## 2018-04-14 DIAGNOSIS — I4821 Permanent atrial fibrillation: Secondary | ICD-10-CM | POA: Diagnosis present

## 2018-04-14 NOTE — ED Notes (Signed)
Bed: WA21 Expected date:  Expected time:  Means of arrival:  Comments: Short of breath

## 2018-04-14 NOTE — ED Triage Notes (Signed)
-  From home -C/C SOB intermittent for the past x2 weeks -Used albuterol at home, stated it hasn't improved her symptoms, last mo was in [place] (went for son's funeral) she was dx w/ pneumonia, finished abx x1 week ago -Also complaining of bilateral leg pain, legs are tight  -  -Known diabetic CBG 121 -BP 123/77 -A-fib, HR 100 bpm -O2 98% RA -RR 16-20 -A&O x 4   -22 G IV in L Hand

## 2018-04-15 ENCOUNTER — Other Ambulatory Visit: Payer: Self-pay

## 2018-04-15 ENCOUNTER — Encounter (HOSPITAL_COMMUNITY): Payer: Self-pay | Admitting: Emergency Medicine

## 2018-04-15 ENCOUNTER — Emergency Department (HOSPITAL_COMMUNITY): Payer: Medicare Other

## 2018-04-15 ENCOUNTER — Observation Stay (HOSPITAL_BASED_OUTPATIENT_CLINIC_OR_DEPARTMENT_OTHER): Payer: Medicare Other

## 2018-04-15 DIAGNOSIS — I361 Nonrheumatic tricuspid (valve) insufficiency: Secondary | ICD-10-CM

## 2018-04-15 DIAGNOSIS — K219 Gastro-esophageal reflux disease without esophagitis: Secondary | ICD-10-CM | POA: Diagnosis present

## 2018-04-15 DIAGNOSIS — M109 Gout, unspecified: Secondary | ICD-10-CM | POA: Diagnosis present

## 2018-04-15 DIAGNOSIS — I5023 Acute on chronic systolic (congestive) heart failure: Secondary | ICD-10-CM | POA: Diagnosis not present

## 2018-04-15 LAB — URINALYSIS, ROUTINE W REFLEX MICROSCOPIC
Bacteria, UA: NONE SEEN
Bilirubin Urine: NEGATIVE
Glucose, UA: NEGATIVE mg/dL
Hgb urine dipstick: NEGATIVE
Ketones, ur: NEGATIVE mg/dL
Nitrite: NEGATIVE
Protein, ur: NEGATIVE mg/dL
Specific Gravity, Urine: 1.01 (ref 1.005–1.030)
pH: 6 (ref 5.0–8.0)

## 2018-04-15 LAB — BASIC METABOLIC PANEL
Anion gap: 9 (ref 5–15)
BUN: 35 mg/dL — ABNORMAL HIGH (ref 8–23)
CALCIUM: 8.9 mg/dL (ref 8.9–10.3)
CHLORIDE: 110 mmol/L (ref 98–111)
CO2: 25 mmol/L (ref 22–32)
CREATININE: 1.76 mg/dL — AB (ref 0.44–1.00)
GFR, EST AFRICAN AMERICAN: 34 mL/min — AB (ref >60)
GFR, EST NON AFRICAN AMERICAN: 29 mL/min — AB (ref >60)
Glucose, Bld: 107 mg/dL — ABNORMAL HIGH (ref 70–99)
POTASSIUM: 3.7 mmol/L (ref 3.5–5.1)
SODIUM: 144 mmol/L (ref 135–145)

## 2018-04-15 LAB — HIV ANTIBODY (ROUTINE TESTING W REFLEX): HIV Screen 4th Generation wRfx: NONREACTIVE

## 2018-04-15 LAB — TROPONIN I
TROPONIN I: 0.04 ng/mL — AB (ref <0.03)
TROPONIN I: 0.05 ng/mL — AB (ref <0.03)
TROPONIN I: 0.04 ng/mL — AB (ref <0.03)
Troponin I: 0.05 ng/mL (ref <0.03)

## 2018-04-15 LAB — CBC
HEMATOCRIT: 28.2 % — AB (ref 36.0–46.0)
Hemoglobin: 8 g/dL — ABNORMAL LOW (ref 12.0–15.0)
MCH: 24 pg — AB (ref 26.0–34.0)
MCHC: 28.4 g/dL — ABNORMAL LOW (ref 30.0–36.0)
MCV: 84.7 fL (ref 80.0–100.0)
NRBC: 0 % (ref 0.0–0.2)
PLATELETS: 157 10*3/uL (ref 150–400)
RBC: 3.33 MIL/uL — ABNORMAL LOW (ref 3.87–5.11)
RDW: 19.3 % — AB (ref 11.5–15.5)
WBC: 13.5 10*3/uL — ABNORMAL HIGH (ref 4.0–10.5)

## 2018-04-15 LAB — APTT: aPTT: 46 seconds — ABNORMAL HIGH (ref 24–36)

## 2018-04-15 LAB — GLUCOSE, CAPILLARY
Glucose-Capillary: 124 mg/dL — ABNORMAL HIGH (ref 70–99)
Glucose-Capillary: 65 mg/dL — ABNORMAL LOW (ref 70–99)
Glucose-Capillary: 145 mg/dL — ABNORMAL HIGH (ref 70–99)
Glucose-Capillary: 96 mg/dL (ref 70–99)

## 2018-04-15 LAB — PROTIME-INR
INR: 2.39
Prothrombin Time: 25.7 seconds — ABNORMAL HIGH (ref 11.4–15.2)

## 2018-04-15 LAB — ECHOCARDIOGRAM COMPLETE
Height: 63 in
WEIGHTICAEL: 3774.28 [oz_av]

## 2018-04-15 LAB — MAGNESIUM: MAGNESIUM: 1.6 mg/dL — AB (ref 1.7–2.4)

## 2018-04-15 LAB — OCCULT BLOOD X 1 CARD TO LAB, STOOL: Fecal Occult Bld: NEGATIVE

## 2018-04-15 LAB — BRAIN NATRIURETIC PEPTIDE: B Natriuretic Peptide: 358.7 pg/mL — ABNORMAL HIGH (ref 0.0–100.0)

## 2018-04-15 MED ORDER — INSULIN GLARGINE 100 UNIT/ML ~~LOC~~ SOLN
28.0000 [IU] | Freq: Every day | SUBCUTANEOUS | Status: DC
  Administered 2018-04-15: 28 [IU] via SUBCUTANEOUS
  Filled 2018-04-15 (×2): qty 0.28

## 2018-04-15 MED ORDER — LEVALBUTEROL HCL 1.25 MG/0.5ML IN NEBU: 1.2500 mg | INHALATION_SOLUTION | Freq: Three times a day (TID) | RESPIRATORY_TRACT | Status: DC

## 2018-04-15 MED ORDER — HYDROCERIN EX CREA
TOPICAL_CREAM | Freq: Two times a day (BID) | CUTANEOUS | Status: DC
  Administered 2018-04-15 – 2018-04-17 (×5): via TOPICAL
  Administered 2018-04-18 – 2018-04-19 (×3): 1 via TOPICAL
  Administered 2018-04-20 – 2018-04-22 (×2): via TOPICAL
  Filled 2018-04-15: qty 113

## 2018-04-15 MED ORDER — PANTOPRAZOLE SODIUM 40 MG PO TBEC
40.0000 mg | DELAYED_RELEASE_TABLET | Freq: Every day | ORAL | Status: DC
  Administered 2018-04-15 – 2018-04-22 (×8): 40 mg via ORAL
  Filled 2018-04-15 (×8): qty 1

## 2018-04-15 MED ORDER — ACETAMINOPHEN 650 MG RE SUPP: 650.0000 mg | Freq: Four times a day (QID) | RECTAL | Status: DC | PRN

## 2018-04-15 MED ORDER — ALLOPURINOL 300 MG PO TABS
300.0000 mg | ORAL_TABLET | Freq: Two times a day (BID) | ORAL | Status: DC
  Administered 2018-04-15 – 2018-04-22 (×14): 300 mg via ORAL
  Filled 2018-04-15 (×15): qty 1

## 2018-04-15 MED ORDER — FUROSEMIDE 10 MG/ML IJ SOLN
40.0000 mg | Freq: Two times a day (BID) | INTRAMUSCULAR | Status: DC
  Administered 2018-04-15 – 2018-04-16 (×4): 40 mg via INTRAVENOUS
  Filled 2018-04-15 (×4): qty 4

## 2018-04-15 MED ORDER — ATORVASTATIN CALCIUM 40 MG PO TABS
40.0000 mg | ORAL_TABLET | Freq: Every day | ORAL | Status: DC
  Administered 2018-04-15 – 2018-04-22 (×8): 40 mg via ORAL
  Filled 2018-04-15 (×9): qty 1

## 2018-04-15 MED ORDER — ONDANSETRON HCL 4 MG PO TABS: 4.0000 mg | ORAL_TABLET | Freq: Four times a day (QID) | ORAL | Status: DC | PRN

## 2018-04-15 MED ORDER — HYDROXYZINE HCL 25 MG PO TABS
25.0000 mg | ORAL_TABLET | Freq: Once | ORAL | Status: AC
  Administered 2018-04-15: 25 mg via ORAL
  Filled 2018-04-15: qty 1

## 2018-04-15 MED ORDER — APIXABAN 5 MG PO TABS
5.0000 mg | ORAL_TABLET | Freq: Two times a day (BID) | ORAL | Status: DC
  Administered 2018-04-15 – 2018-04-22 (×15): 5 mg via ORAL
  Filled 2018-04-15 (×15): qty 1

## 2018-04-15 MED ORDER — HEPARIN SODIUM (PORCINE) 5000 UNIT/ML IJ SOLN: 5000.0000 [IU] | Freq: Three times a day (TID) | INTRAMUSCULAR | Status: DC

## 2018-04-15 MED ORDER — LORAZEPAM 1 MG PO TABS
1.0000 mg | ORAL_TABLET | Freq: Two times a day (BID) | ORAL | Status: DC | PRN
  Administered 2018-04-15 – 2018-04-17 (×3): 1 mg via ORAL
  Filled 2018-04-15 (×3): qty 1

## 2018-04-15 MED ORDER — HYDROCODONE-ACETAMINOPHEN 5-325 MG PO TABS
1.0000 | ORAL_TABLET | ORAL | Status: DC | PRN
  Administered 2018-04-15 – 2018-04-22 (×20): 1 via ORAL
  Filled 2018-04-15 (×20): qty 1

## 2018-04-15 MED ORDER — POTASSIUM CHLORIDE CRYS ER 20 MEQ PO TBCR
20.0000 meq | EXTENDED_RELEASE_TABLET | Freq: Two times a day (BID) | ORAL | Status: DC
  Administered 2018-04-15 – 2018-04-22 (×14): 20 meq via ORAL
  Filled 2018-04-15 (×15): qty 1

## 2018-04-15 MED ORDER — ISOSORBIDE MONONITRATE ER 60 MG PO TB24
60.0000 mg | ORAL_TABLET | Freq: Every day | ORAL | Status: DC
Start: 2018-04-15 — End: 2018-04-16
  Administered 2018-04-15 – 2018-04-16 (×2): 60 mg via ORAL
  Filled 2018-04-15 (×2): qty 1

## 2018-04-15 MED ORDER — IPRATROPIUM BROMIDE 0.02 % IN SOLN
0.5000 mg | Freq: Two times a day (BID) | RESPIRATORY_TRACT | Status: DC
  Administered 2018-04-15 – 2018-04-17 (×4): 0.5 mg via RESPIRATORY_TRACT
  Filled 2018-04-15 (×4): qty 2.5

## 2018-04-15 MED ORDER — IPRATROPIUM BROMIDE 0.02 % IN SOLN: 0.5000 mg | Freq: Three times a day (TID) | RESPIRATORY_TRACT | Status: DC

## 2018-04-15 MED ORDER — HYDROCODONE-ACETAMINOPHEN 5-325 MG PO TABS
1.0000 | ORAL_TABLET | Freq: Once | ORAL | Status: AC
  Administered 2018-04-15: 1 via ORAL
  Filled 2018-04-15: qty 1

## 2018-04-15 MED ORDER — FUROSEMIDE 10 MG/ML IJ SOLN
40.0000 mg | Freq: Once | INTRAMUSCULAR | Status: AC
  Administered 2018-04-15: 40 mg via INTRAVENOUS
  Filled 2018-04-15: qty 4

## 2018-04-15 MED ORDER — INSULIN ASPART 100 UNIT/ML ~~LOC~~ SOLN
0.0000 [IU] | Freq: Three times a day (TID) | SUBCUTANEOUS | Status: DC
  Administered 2018-04-16 – 2018-04-22 (×8): 3 [IU] via SUBCUTANEOUS

## 2018-04-15 MED ORDER — TRAZODONE HCL 50 MG PO TABS
50.0000 mg | ORAL_TABLET | Freq: Every day | ORAL | Status: DC
  Administered 2018-04-15 – 2018-04-21 (×7): 50 mg via ORAL
  Filled 2018-04-15 (×7): qty 1

## 2018-04-15 MED ORDER — COLCHICINE 0.6 MG PO TABS
0.6000 mg | ORAL_TABLET | Freq: Every evening | ORAL | Status: DC
  Administered 2018-04-15 – 2018-04-21 (×7): 0.6 mg via ORAL
  Filled 2018-04-15 (×7): qty 1

## 2018-04-15 MED ORDER — LEVALBUTEROL HCL 1.25 MG/0.5ML IN NEBU
1.2500 mg | INHALATION_SOLUTION | Freq: Three times a day (TID) | RESPIRATORY_TRACT | Status: DC
  Administered 2018-04-15: 1.25 mg via RESPIRATORY_TRACT

## 2018-04-15 MED ORDER — NEBIVOLOL HCL 10 MG PO TABS
10.0000 mg | ORAL_TABLET | Freq: Every day | ORAL | Status: DC
  Administered 2018-04-15 – 2018-04-22 (×8): 10 mg via ORAL
  Filled 2018-04-15 (×8): qty 1

## 2018-04-15 MED ORDER — FUROSEMIDE 10 MG/ML IJ SOLN: 80.0000 mg | Freq: Once | INTRAMUSCULAR | Status: DC

## 2018-04-15 MED ORDER — MAGNESIUM SULFATE 2 GM/50ML IV SOLN
2.0000 g | Freq: Once | INTRAVENOUS | Status: AC
  Administered 2018-04-15: 2 g via INTRAVENOUS
  Filled 2018-04-15: qty 50

## 2018-04-15 MED ORDER — POTASSIUM CHLORIDE CRYS ER 20 MEQ PO TBCR
20.0000 meq | EXTENDED_RELEASE_TABLET | Freq: Once | ORAL | Status: AC
  Administered 2018-04-15: 20 meq via ORAL
  Filled 2018-04-15: qty 1

## 2018-04-15 MED ORDER — LEVALBUTEROL HCL 1.25 MG/0.5ML IN NEBU
1.2500 mg | INHALATION_SOLUTION | Freq: Two times a day (BID) | RESPIRATORY_TRACT | Status: DC
  Administered 2018-04-15 – 2018-04-17 (×4): 1.25 mg via RESPIRATORY_TRACT
  Filled 2018-04-15 (×4): qty 0.5

## 2018-04-15 MED ORDER — ACETAMINOPHEN 325 MG PO TABS: 650.0000 mg | ORAL_TABLET | Freq: Four times a day (QID) | ORAL | Status: DC | PRN

## 2018-04-15 MED ORDER — IPRATROPIUM BROMIDE 0.02 % IN SOLN
0.5000 mg | Freq: Three times a day (TID) | RESPIRATORY_TRACT | Status: DC
  Administered 2018-04-15: 0.5 mg via RESPIRATORY_TRACT

## 2018-04-15 MED ORDER — EZETIMIBE 10 MG PO TABS
10.0000 mg | ORAL_TABLET | Freq: Every day | ORAL | Status: DC
  Administered 2018-04-15 – 2018-04-22 (×8): 10 mg via ORAL
  Filled 2018-04-15 (×7): qty 1

## 2018-04-15 MED ORDER — ALBUTEROL SULFATE (2.5 MG/3ML) 0.083% IN NEBU: 2.5000 mg | INHALATION_SOLUTION | Freq: Four times a day (QID) | RESPIRATORY_TRACT | Status: DC | PRN

## 2018-04-15 MED ORDER — ONDANSETRON HCL 4 MG/2ML IJ SOLN: 4.0000 mg | Freq: Four times a day (QID) | INTRAMUSCULAR | Status: DC | PRN

## 2018-04-15 NOTE — ED Notes (Signed)
When family was asked about trip to [place] for son's funeral they said the patient was treated for the flu, given abx to trt a "blood infection" but the blood cultures came back without indication of a  "blood infection"

## 2018-04-15 NOTE — H&P (Signed)
History and Physical    [name] OEV:[id number] DOB: 01-24-1951 DOA: 04/14/2018  PCP: Lin Landsman, MD   Patient coming from: Home.  I have personally briefly reviewed patient's old medical records in [place]  Chief Complaint: Shortness of breath.  HPI: Stephanie Franco is a 67 y.o. female with medical history significant of aortic atherosclerosis, arthritis, asthma, permanent atrial fibrillation, history of breast cancer with history of left mastectomy, CAD, cardiomegaly, stage III chronic kidney disease, type 2 diabetes, dilated cardiomyopathy with an EF of 40 to 45% on last echo, uterine fibroid, GERD, gout, hyperlipidemia, hypertension, morbid obesity, OSA on CPAP, pulmonary hypertension, systolic murmur who is coming to the emergency department with complaints of progressively worse dyspnea associated with lower extremity edema for the past 3 weeks.   The patient underwent hip replacement surgery in September.  Unfortunately, shortly after that her son passed away in [place].  While she was there, she had to be admitted to the hospital for 5 days due to acute influenza and bacteremia.  She has been anxious and having trouble sleeping after her son passed.  She was prescribed Ambien, but states that it is not working.  She denies chest pain, palpitations, dizziness, diaphoresis, PND orthopnea.  However, the patient's uses CPAP at bedtime.  He has gained about 5 pounds of fluid.  She denies post hospital discharge and recent fever, chills, rhinorrhea, sore throat, productive cough, wheezing or hemoptysis.She denies dietary indiscretions of sodium or increase fluid intake.  She denies abdominal pain, nausea, emesis, diarrhea, constipation, melena or hematochezia.  She denies dysuria, frequency or hematuria.  She complains of recent pruritus in the legs and arms.  She denies polyuria, polydipsia or polyphagia.  ED Course: Initial vital signs temperature 99.6 F, pulse 93, respirations  18, blood pressure 131/81 mmHg and O2 sat 99% on room air.  The patient received supplemental oxygen and IV furosemide in the emergency department.  Lab work shows a white count of 13.5, hemoglobin 8.0 and platelets 157.  PT 25.7 and PTT 46 seconds.  INR was 2.39.  Her troponin was 0.05 ng/mL.  Her BNP was 358.7 pg/mL.  Her electrolytes were within normal limits except for a magnesium level of 1.6 mg/dL.  Glucose 107, BUN 35 and creatinine 1.76 mg/dL.  EKG shows atrial fibrillation with incomplete LBBB, LVH with secondary repolarization abnormalities and anterior Q waves.  There were no changes when compared to previous tracing.  Her chest radiograph shows cardiomegaly with perihilar vascular congestion without frank pulmonary edema.  There is aortic atherosclerosis.  Please see images and full radiology report for further detail.  Review of Systems: As per HPI otherwise 10 point review of systems negative.   Past Medical History:  Diagnosis Date  . Aortic atherosclerosis ([place])   . Arthritis   . Asthma   . Atrial fibrillation, permanent    Rate control with Bystolic. CHA2DS2Vasc = 6 (HTN, DM, CHF, Age 89, Female) -> on Pradaxa  . Breast cancer ([place]) 2008   S/P mastectomy  . CAD S/P percutaneous coronary angioplasty 2006;    PCI of circumflex with Taxus DES;; relook-cath Feb 2013: 50-60% short lesion in RCA, 40% ISR Circumflex stent.  . Cardiomegaly   . CKD (chronic kidney disease), stage III ([place])   . Complication of anesthesia    prolonged sedation after colonoscopy in [place]  . Diabetes mellitus, uncontrolled 07/14/2011  . Dilated cardiomyopathy ([place])    Mostly Resolved (EF up from ~25% to ~45%  by Echo)  . DOE (dyspnea on exertion)   . Edema of both legs    Usually mild, chronic. Controlled with when necessary furosemide and diet  . 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 GERD (gastroesophageal reflux disease)   . Gout   . Hyperlipidemia   . Hypertension   . Hypokalemia   .  Morbid obesity (HCC)    BMI 41  . OSA on CPAP    On CPAP  . Pulmonary hypertension, unspecified ([place])    PAP ~90 mmHg on Echo 12/2017 -- has OSA on CPAP & obesity - but Overall Improved Sx of dyspnea / edema  . Systolic murmur     Past Surgical History:  Procedure Laterality Date  . COLONOSCOPY    . CORONARY ANGIOPLASTY WITH STENT PLACEMENT  2006   stent to circumflex  . DILATION AND CURETTAGE OF UTERUS    . LEFT HEART CATHETERIZATION WITH CORONARY ANGIOGRAM N/A 07/13/2011   Procedure: LEFT HEART CATHETERIZATION WITH CORONARY ANGIOGRAM;  Surgeon: Leonie Man, MD;  Location: [facility] CATH LAB;  Service: Cardiovascular;  normal EF; 14mm 50-60% lesion in RCA; patent stent in circumflex form 2006 with ~40% in-stent re-stenosis  . MASTECTOMY  2008   left  . NM MYOVIEW LTD  11/22/2014   Low Risk. No ischemia or infarction. Not gated 2/2  A. fib - unable to assess EF  . TOTAL HIP ARTHROPLASTY Right 01/19/2018   Procedure: RIGHT TOTAL HIP ARTHROPLASTY ANTERIOR APPROACH;  Surgeon: Rod Can, MD;  Location: [facility] ORS;  Service: Orthopedics;  Laterality: Right;  Needs RNFA  . TRANSTHORACIC ECHOCARDIOGRAM  01/2015   LV EF 40-45%, Mild Anteroseptal HK. Mod-Severe  RA dilation with elevated PA Pressures (~~peak 63 mmHg).. Mild LA dilation  . TRANSTHORACIC ECHOCARDIOGRAM  June 2015   In setting of Urosepsis: EF ~25 %, global HK (poor quality study)  . TRANSTHORACIC ECHOCARDIOGRAM  12/2017   Improved LVEF to 45 to 50%.  Pulmonary hypertension estimated PA pressures of 90 mmHg. Severe LA dilation& mild /rv dilation.     reports that she quit smoking about 25 years ago. Her smoking use included cigarettes. She has a 3.00 pack-year smoking history. She has never used smokeless tobacco. She reports that she does not drink alcohol or use drugs.  No Known Allergies  Family History  Problem Relation Age of Onset  . Coronary artery disease Mother   . Hypertension Mother   . Atrial fibrillation Son     Prior to Admission medications   Medication Sig Start Date End Date Taking? Authorizing Provider  albuterol (PROVENTIL HFA;VENTOLIN HFA) 108 (90 Base) MCG/ACT inhaler Inhale 1-2 puffs into the lungs every 6 (six) hours as needed for wheezing or shortness of breath. 05/20/15  Yes Harvel Quale, MD  albuterol (PROVENTIL) (2.5 MG/3ML) 0.083% nebulizer solution Take 3 mLs (2.5 mg total) by nebulization every 6 (six) hours as needed for wheezing or shortness of breath. 05/20/15  Yes Harvel Quale, MD  allopurinol (ZYLOPRIM) 300 MG tablet Take 300 mg by mouth 2 (two) times daily.    Yes [provider]  atorvastatin (LIPITOR) 40 MG tablet Take 1 tablet (40 mg total) by mouth daily. Patient taking differently: Take 40 mg by mouth daily at 2 PM. In the afternoon. 12/10/17  Yes Leonie Man, MD  colchicine 0.6 MG tablet Take 0.6 mg by mouth every evening.    Yes [provider]  ELIQUIS 5 MG TABS tablet Take 1  tablet (5 mg total) by mouth 2 (two) times daily. 03/21/18  Yes Leonie Man, MD  ezetimibe (ZETIA) 10 MG tablet Take 1 tablet (10 mg total) by mouth daily. Patient taking differently: Take 10 mg by mouth daily at 2 PM.  12/10/17  Yes Leonie Man, MD  fexofenadine (ALLEGRA) 180 MG tablet Take 180 mg by mouth daily as needed for allergies.  04/30/15  Yes [provider]  fluticasone (FLONASE) 50 MCG/ACT nasal spray Place 2 sprays into the nose daily as needed for allergies or rhinitis.    Yes [provider]  furosemide (LASIX) 80 MG tablet Take 1 tablet (80 mg total) by mouth 2 (two) times daily. Patient taking differently: Take 80 mg by mouth 2 (two) times daily. In the morning & in the afternoon. 12/10/17  Yes Leonie Man, MD  HYDROcodone-acetaminophen (NORCO/VICODIN) 5-325 MG tablet Take 1-2 tablets by mouth every 6 (six) hours as needed (postop hip pain). 01/20/18  Yes Swinteck, Aaron Edelman, MD  Insulin Glargine (TOUJEO SOLOSTAR) 300 UNIT/ML SOPN  Inject 28 Units into the skin daily with supper.    Yes [provider]  isosorbide mononitrate (IMDUR) 60 MG 24 hr tablet Take 1 tablet (60 mg total) by mouth daily. 12/10/17  Yes Leonie Man, MD  lisinopril (PRINIVIL,ZESTRIL) 40 MG tablet Take 1 tablet (40 mg total) by mouth daily. 02/08/18 05/09/18 Yes Leonie Man, MD  LORazepam (ATIVAN) 1 MG tablet Take 1 mg by mouth 2 (two) times daily. In the evening with supper & at bedtime 07/07/16  Yes [provider]  Multiple Vitamins-Minerals (MULTIVITAMIN WITH MINERALS) tablet Take 1 tablet by mouth daily.   Yes [provider]  nebivolol (BYSTOLIC) 10 MG tablet Take 1 tablet (10 mg total) by mouth daily. 12/10/17  Yes Leonie Man, MD  nitroGLYCERIN (NITROSTAT) 0.4 MG SL tablet DISSOLVE 1 TABLET (0.4 MG TOTAL) UNDER THE TONGUE EVERY 5 (FIVE) MINUTES AS NEEDED FOR CHEST PAIN. 07/13/17  Yes Leonie Man, MD  ondansetron (ZOFRAN) 4 MG tablet Take 1 tablet (4 mg total) by mouth every 6 (six) hours as needed for nausea. 01/20/18  Yes Swinteck, Aaron Edelman, MD  potassium chloride SA (KLOR-CON M20) 20 MEQ tablet Take 1 tablet (20 mEq total) by mouth 2 (two) times daily. Patient taking differently: Take 20 mEq by mouth 2 (two) times daily. Morning & afternoon 12/10/17  Yes Leonie Man, MD  zolpidem (AMBIEN) 10 MG tablet Take 10 mg by mouth at bedtime. 03/22/18  Yes [provider]  docusate sodium (COLACE) 100 MG capsule Take 1 capsule (100 mg total) by mouth 2 (two) times daily. Patient not taking: Reported on 04/15/2018 01/20/18   Rod Can, MD  ferrous sulfate 325 (65 FE) MG EC tablet Take 325 mg by mouth daily with breakfast.    [provider]  glucose blood (ACCU-CHEK SMARTVIEW) test strip 1 each by Other route as needed for other. Use as instructed    [provider]  pantoprazole (PROTONIX) 40 MG tablet Take 1 tablet (40 mg total) by mouth daily. Patient not taking: Reported on 04/15/2018  12/10/17   Leonie Man, MD  senna (SENOKOT) 8.6 MG TABS tablet Take 1 tablet (8.6 mg total) by mouth 2 (two) times daily. Patient not taking: Reported on 04/15/2018 01/20/18   Rod Can, MD    Physical Exam: Vitals:   04/15/18 0100 04/15/18 0145 04/15/18 0300 04/15/18 0345  BP: 138/84 118/85 (!) 138/99 137/85  Pulse: Marland Kitchen)  109 95 (!) 110 (!) 105  Resp: (!) 30 (!) 27 (!) 21 (!) 32  Temp:      TempSrc:      SpO2: 100% 97% 97% 97%  Weight:      Height:        Constitutional: NAD, calm, comfortable Eyes: PERRL, lids and conjunctivae normal ENMT: Mucous membranes are moist. Posterior pharynx clear of any exudate or lesions. Neck: Normal, supple, no masses, no thyromegaly.  Respiratory: Decreased breath sounds with basilar rales bilaterally, no wheezing. Normal respiratory effort. No accessory muscle use.  Cardiovascular: Irregularly irregular at 83 bpm at the moment, 2/6 systolic murmur, no rubs / gallops.  1-1/2 to 2+ lower extremities pitting edema. 2+ pedal pulses. No carotid bruits.  Abdomen: Obese, soft, no tenderness, no masses palpated. No hepatosplenomegaly. Bowel sounds positive.  Musculoskeletal: no clubbing / cyanosis. Good ROM, no contractures. Normal muscle tone.  Skin: Areas of erythema on extremities. Neurologic: CN 2-12 grossly intact. Sensation intact, DTR normal. Strength 5/5 in all 4.  Psychiatric: Normal judgment and insight. Alert and oriented x 3. Normal mood.   Labs on Admission: I have personally reviewed following labs and imaging studies  CBC: Recent Labs  Lab 04/15/18 0050  WBC 13.5*  HGB 8.0*  HCT 28.2*  MCV 84.7  PLT 606   Basic Metabolic Panel: Recent Labs  Lab 04/15/18 0050  NA 144  K 3.7  CL 110  CO2 25  GLUCOSE 107*  BUN 35*  CREATININE 1.76*  CALCIUM 8.9  MG 1.6*   GFR: Estimated Creatinine Clearance: 35.7 mL/min (A) (by C-G formula based on SCr of 1.76 mg/dL (H)). Liver Function Tests: No results for input(s): AST, ALT,  ALKPHOS, BILITOT, PROT, ALBUMIN in the last 168 hours. No results for input(s): LIPASE, AMYLASE in the last 168 hours. No results for input(s): AMMONIA in the last 168 hours. Coagulation Profile: Recent Labs  Lab 04/15/18 0050  INR 2.39   Cardiac Enzymes: Recent Labs  Lab 04/15/18 0050  TROPONINI 0.05*   BNP (last 3 results) No results for input(s): PROBNP in the last 8760 hours. HbA1C: No results for input(s): HGBA1C in the last 72 hours. CBG: No results for input(s): GLUCAP in the last 168 hours. Lipid Profile: No results for input(s): CHOL, HDL, LDLCALC, TRIG, CHOLHDL, LDLDIRECT in the last 72 hours. Thyroid Function Tests: No results for input(s): TSH, T4TOTAL, FREET4, T3FREE, THYROIDAB in the last 72 hours. Anemia Panel: No results for input(s): VITAMINB12, FOLATE, FERRITIN, TIBC, IRON, RETICCTPCT in the last 72 hours. Urine analysis:    Component Value Date/Time   COLORURINE YELLOW 02/20/2017 2137   APPEARANCEUR CLEAR 02/20/2017 2137   LABSPEC 1.016 02/20/2017 2137   PHURINE 5.0 02/20/2017 2137   GLUCOSEU NEGATIVE 02/20/2017 2137   HGBUR NEGATIVE 02/20/2017 2137   BILIRUBINUR NEGATIVE 02/20/2017 2137   [place] NEGATIVE 02/20/2017 2137   PROTEINUR 30 (A) 02/20/2017 2137   UROBILINOGEN 1.0 10/28/2014 1440   NITRITE NEGATIVE 02/20/2017 2137   LEUKOCYTESUR NEGATIVE 02/20/2017 2137    Radiological Exams on Admission: Dg Chest 2 View  Result Date: 04/15/2018 CLINICAL DATA:  Initial evaluation for extremity swelling, shortness of breath. EXAM: CHEST - 2 VIEW COMPARISON:  Prior radiograph from 02/20/2017. FINDINGS: Moderate to severe cardiomegaly, similar to previous. Mediastinal silhouette normal. Aortic atherosclerosis. Calcified left hilar adenopathy noted, stable. Lungs mildly hypoinflated. Perihilar vascular congestion without overt pulmonary edema. No focal infiltrates. No pleural effusion. No pneumothorax. No acute osseous abnormality. IMPRESSION: 1.  Cardiomegaly with  perihilar vascular congestion without frank pulmonary edema. 2. Aortic atherosclerosis. Electronically Signed   By: Jeannine Boga M.D.   On: 04/15/2018 01:16   12/17/2017 echocardiogram complete  ------------------------------------------------------------------- LV EF: 45% -   50%  ------------------------------------------------------------------- Indications:      I42.9 Cardiomyopathy, unspecified. H84.6 Systolic murmur. Z01.810 Preoperative cardiovascular exam.  ------------------------------------------------------------------- History:   PMH:  Edema. Asthma. Morbid obesity. Edema. Obstructive sleep apnea on CPAP. Breast cancer. Left breast mastectomy. Dyspnea.  Atrial fibrillation.  Coronary artery disease.  Risk factors:  Diabetes mellitus. Dyslipidemia.  ------------------------------------------------------------------- Study Conclusions  - Left ventricle: Basal inferior hypokinesis/akinesis The cavity   size was normal. Wall thickness was increased in a pattern of   mild LVH. Systolic function was mildly reduced. The estimated   ejection fraction was in the range of 45% to 50%. - Aortic valve: There was trivial regurgitation. - Left atrium: The atrium was moderately dilated. - Right ventricle: The cavity size was mildly dilated. Systolic   function was mildly to moderately reduced. - Right atrium: The atrium was severely dilated. - Pulmonary arteries: PA peak pressure: 90 mm Hg (S). - Pericardium, extracardiac: A trivial pericardial effusion was   identified.  Impressions:  - Compared to report from previuos echo, LVEF is improved, RV   function is down and PAP is increased. Poor acoustic windows.   Difficult to see endocardium  EKG: Independently reviewed. Vent. rate 105 BPM PR interval * ms QRS duration 113 ms QT/QTc 368/487 ms P-R-T axes * -65 106 Atrial fibrillation Incomplete left bundle branch block LVH with secondary  repolarization abnormality Anterior Q waves, possibly due to LVH No acute changes  Assessment/Plan Principal Problem:   Acute on chronic systolic heart failure (HCC) There is also some right-sided heart failure component as well. Observation/telemetry. Continue supplemental oxygen. Continue furosemide 40 mg IVP twice a day. Hold ACE inhibitor. Resume nebivolol later today. Monitor daily weights, intake and output. Monitor renal function and electrolytes. Supposed to get echo and see cardiology next week. Given her symptoms and history of severe pulmonary hypertension, will check echocardiogram while in the hospital.  Active Problems:   AKI (acute kidney injury) ([place]) This may be due to heart failure. Hold ACE inhibitor. Switch furosemide to IV. Monitor intake and output. Monitor renal function and electrolytes.    Essential hypertension Hold lisinopril. Resume nebivolol later today.    Hyperlipidemia with target LDL less than 70 Continue Zetia and atorvastatin.    Atrial fibrillation, permanent ([place]);  CHA2DS2Vasc - 6 Resume nebivolol later today since the patient is feeling better. Continue Eliquis for anticoagulation.    GERD (gastroesophageal reflux disease) Resume PPI.    Gout Continue allopurinol.    Hypomagnesemia Replaced. Follow-up level as needed    Elevated troponin Likely due to demand ischemia and renal insufficiency. Continue cardiac telemetry and troponin level trending.      DVT prophylaxis: On Eliquis. Code Status: Full code. Family Communication: Her husband and daughter were present in the ED room. Disposition Plan: Observation for acute systolic CHF exacerbation. Consults called: Admission status: Observation/telemetry.   Reubin Milan MD [facility] Pager [phone].  If 7PM-7AM, please contact night-coverage [email or web address] Password [email or web address]  04/15/2018, 4:02 AM

## 2018-04-15 NOTE — Plan of Care (Signed)
  Problem: Education: Goal: Knowledge of General Education information will improve Description: Including pain rating scale, medication(s)/side effects and non-pharmacologic comfort measures Outcome: Progressing   Problem: Clinical Measurements: Goal: Respiratory complications will improve Outcome: Progressing   Problem: Activity: Goal: Risk for activity intolerance will decrease Outcome: Progressing   

## 2018-04-15 NOTE — Progress Notes (Signed)
On call provider notified pt had 7 beats of VTach at 2250 while RN at bedside. Pt asymptomatic with VSS. Will continue to monitor closely.

## 2018-04-15 NOTE — Progress Notes (Signed)
  Echocardiogram 2D Echocardiogram has been performed.  Stephanie Franco 04/15/2018, 8:58 AM

## 2018-04-15 NOTE — Care Management Note (Addendum)
Case Management Note  Patient Details  Name: Stephanie Franco MRN: [id number] Date of Birth: [birth year]  Subjective/Objective:                    Action/Plan: Pt discharging home with [facility] for CHF program. [facility] was informed to call pt's husband 1st for follow up.    Expected Discharge Date:                  Expected Discharge Plan:  [place]  In-House Referral:     Discharge planning Services  CM Consult  Post Acute Care Choice:  Home Health Choice offered to:  Patient, Adult Children, Spouse  DME Arranged:    DME Agency:     HH Arranged:  RN, PT [place] Agency:  [facility]  Status of Service:  Completed, signed off  If discussed at [place] of Stay Meetings, dates discussed:    Additional CommentsPurcell Mouton, RN 04/15/2018, 2:26 PM

## 2018-04-15 NOTE — Progress Notes (Signed)
History of chronic A. fib, admitted with CHF exacerbation Volume overloaded on exam Diuresing  well with IV Lasix Echo Compared to a prior study in 12/2017, the LVEF is lower at 40-45% Cardiology consulted Family updated at bedside

## 2018-04-15 NOTE — ED Provider Notes (Signed)
[facility] DEPT Provider Note   CSN: [id number] Arrival date & time: 04/14/18  2354     History   Chief Complaint Chief Complaint  Patient presents with  . Shortness of Breath    HPI Stephanie Franco is a 67 y.o. female.  HPI  67 year old female comes in with chief complaint of shortness of breath. Patient has history of CAD, CKD, CHF, A. fib, pulmonary hypertension likely due to valvular disorder. Patient reports that she has had increasing shortness of breath over the past 3 weeks.  She is now unable to get up and get to the bathroom without getting short of breath.  The normal day patient is able to walk around the house without getting short of breath.  She also thinks she has gained about 5 pounds and has increasing pitting edema.  Patient wears CPAP at night therefore she is unable to tell us if she is having orthopnea-like symptoms.  There is no new cough, wheezing and patient denies any fevers or chills.  Patient also states that she has had new redness in her leg and itching.  However her symptoms started after she applied some Tiger balm to her legs because of pain.  Patient has no history of PE, DVT.  She had a recent hip replacement surgery about 3 months ago.  Patient is taking Eliquis for her A. Fib.  Past Medical History:  Diagnosis Date  . Aortic atherosclerosis ([place])   . Arthritis   . Asthma   . Atrial fibrillation, permanent    Rate control with Bystolic. CHA2DS2Vasc = 6 (HTN, DM, CHF, Age 24, Female) -> on Pradaxa  . Breast cancer ([place]) 2008   S/P mastectomy  . CAD S/P percutaneous coronary angioplasty 2006;    PCI of circumflex with Taxus DES;; relook-cath Feb 2013: 50-60% short lesion in RCA, 40% ISR Circumflex stent.  . Cardiomegaly   . CKD (chronic kidney disease), stage III ([place])   . Complication of anesthesia    prolonged sedation after colonoscopy in [place]  . Diabetes mellitus, uncontrolled 07/14/2011  . Dilated  cardiomyopathy ([place])    Mostly Resolved (EF up from ~25% to ~45% by Echo)  . DOE (dyspnea on exertion)   . Edema of both legs    Usually mild, chronic. Controlled with when necessary furosemide and diet  . 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 GERD (gastroesophageal reflux disease)   . Gout   . Hyperlipidemia   . Hypertension   . Hypokalemia   . Morbid obesity ([facility])    BMI 41  . OSA on CPAP    On CPAP  . Pulmonary hypertension, unspecified ([place])    PAP ~90 mmHg on Echo 12/2017 -- has OSA on CPAP & obesity - but Overall Improved Sx of dyspnea / edema  . Systolic murmur     Patient Active Problem List   Diagnosis Date Noted  . Primary osteoarthritis of right hip 01/19/2018  . Osteoarthritis of right hip 01/19/2018  . Pulmonary HTN ([place]) 01/06/2018  . Systolic murmur 74/82/7078  . Preoperative cardiovascular examination 12/10/2017  . Abnormal serum creatinine level 05/31/2017  . Medication management 11/16/2016  . 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 Chronic combined systolic and diastolic heart failure, NYHA class 2 ([place])   . Hypokalemia 10/31/2014  . Pyelonephritis 10/31/2014  . Abnormal liver function 10/31/2014  . Acute renal failure superimposed on stage 3 chronic kidney disease ([place])   . Fever 06/29/2013  .  Severe obesity (BMI >= 40) ([place]) 06/24/2013  . Leg cramping 06/24/2013  . DOE (dyspnea on exertion) 12/20/2012    Class: Diagnosis of  . CAD S/P percutaneous coronary angioplasty   . Atrial fibrillation, permanent ([place]); CHA2DS2Vasc - 6   . Edema of both legs   . Diabetes mellitus ([place]) 07/14/2011  . Morbid obesity ([place]) 07/12/2011  . Uterine fibroid, (bleeding issues when she was put on Pradaxa for AF) 07/12/2011  . Hyperlipidemia with target LDL less than 70 07/12/2011  . OSA on CPAP 07/12/2011  . Essential hypertension     Past Surgical History:  Procedure Laterality Date  . COLONOSCOPY    . CORONARY ANGIOPLASTY WITH STENT  PLACEMENT  2006   stent to circumflex  . DILATION AND CURETTAGE OF UTERUS    . LEFT HEART CATHETERIZATION WITH CORONARY ANGIOGRAM N/A 07/13/2011   Procedure: LEFT HEART CATHETERIZATION WITH CORONARY ANGIOGRAM;  Surgeon: Leonie Man, MD;  Location: [facility] CATH LAB;  Service: Cardiovascular;  normal EF; 72mm 50-60% lesion in RCA; patent stent in circumflex form 2006 with ~40% in-stent re-stenosis  . MASTECTOMY  2008   left  . NM MYOVIEW LTD  11/22/2014   Low Risk. No ischemia or infarction. Not gated 2/2  A. fib - unable to assess EF  . TOTAL HIP ARTHROPLASTY Right 01/19/2018   Procedure: RIGHT TOTAL HIP ARTHROPLASTY ANTERIOR APPROACH;  Surgeon: Rod Can, MD;  Location: [facility] ORS;  Service: Orthopedics;  Laterality: Right;  Needs RNFA  . TRANSTHORACIC ECHOCARDIOGRAM  01/2015   LV EF 40-45%, Mild Anteroseptal HK. Mod-Severe  RA dilation with elevated PA Pressures (~~peak 63 mmHg).. Mild LA dilation  . TRANSTHORACIC ECHOCARDIOGRAM  June 2015   In setting of Urosepsis: EF ~25 %, global HK (poor quality study)  . TRANSTHORACIC ECHOCARDIOGRAM  12/2017   Improved LVEF to 45 to 50%.  Pulmonary hypertension estimated PA pressures of 90 mmHg. Severe LA dilation& mild /rv dilation.     OB History   None      Home Medications    Prior to Admission medications   Medication Sig Start Date End Date Taking? Authorizing Provider  albuterol (PROVENTIL HFA;VENTOLIN HFA) 108 (90 Base) MCG/ACT inhaler Inhale 1-2 puffs into the lungs every 6 (six) hours as needed for wheezing or shortness of breath. 05/20/15  Yes Harvel Quale, MD  albuterol (PROVENTIL) (2.5 MG/3ML) 0.083% nebulizer solution Take 3 mLs (2.5 mg total) by nebulization every 6 (six) hours as needed for wheezing or shortness of breath. 05/20/15  Yes Harvel Quale, MD  allopurinol (ZYLOPRIM) 300 MG tablet Take 300 mg by mouth 2 (two) times daily.    Yes [provider]  atorvastatin (LIPITOR) 40 MG tablet Take 1 tablet (40 mg total)  by mouth daily. Patient taking differently: Take 40 mg by mouth daily at 2 PM. In the afternoon. 12/10/17  Yes Leonie Man, MD  colchicine 0.6 MG tablet Take 0.6 mg by mouth every evening.    Yes [provider]  ELIQUIS 5 MG TABS tablet Take 1 tablet (5 mg total) by mouth 2 (two) times daily. 03/21/18  Yes Leonie Man, MD  ezetimibe (ZETIA) 10 MG tablet Take 1 tablet (10 mg total) by mouth daily. Patient taking differently: Take 10 mg by mouth daily at 2 PM.  12/10/17  Yes Leonie Man, MD  fexofenadine (ALLEGRA) 180 MG tablet Take 180 mg by mouth daily as needed for allergies.  04/30/15  Yes [provider]  fluticasone (FLONASE) 50 MCG/ACT nasal spray Place 2 sprays into the nose daily as needed for allergies or rhinitis.    Yes [provider]  furosemide (LASIX) 80 MG tablet Take 1 tablet (80 mg total) by mouth 2 (two) times daily. Patient taking differently: Take 80 mg by mouth 2 (two) times daily. In the morning & in the afternoon. 12/10/17  Yes Leonie Man, MD  HYDROcodone-acetaminophen (NORCO/VICODIN) 5-325 MG tablet Take 1-2 tablets by mouth every 6 (six) hours as needed (postop hip pain). 01/20/18  Yes Swinteck, Aaron Edelman, MD  Insulin Glargine (TOUJEO SOLOSTAR) 300 UNIT/ML SOPN Inject 28 Units into the skin daily with supper.    Yes [provider]  isosorbide mononitrate (IMDUR) 60 MG 24 hr tablet Take 1 tablet (60 mg total) by mouth daily. 12/10/17  Yes Leonie Man, MD  lisinopril (PRINIVIL,ZESTRIL) 40 MG tablet Take 1 tablet (40 mg total) by mouth daily. 02/08/18 05/09/18 Yes Leonie Man, MD  LORazepam (ATIVAN) 1 MG tablet Take 1 mg by mouth 2 (two) times daily. In the evening with supper & at bedtime 07/07/16  Yes [provider]  Multiple Vitamins-Minerals (MULTIVITAMIN WITH MINERALS) tablet Take 1 tablet by mouth daily.   Yes [provider]  nebivolol (BYSTOLIC) 10 MG tablet Take 1 tablet (10 mg total) by mouth  daily. 12/10/17  Yes Leonie Man, MD  nitroGLYCERIN (NITROSTAT) 0.4 MG SL tablet DISSOLVE 1 TABLET (0.4 MG TOTAL) UNDER THE TONGUE EVERY 5 (FIVE) MINUTES AS NEEDED FOR CHEST PAIN. 07/13/17  Yes Leonie Man, MD  ondansetron (ZOFRAN) 4 MG tablet Take 1 tablet (4 mg total) by mouth every 6 (six) hours as needed for nausea. 01/20/18  Yes Swinteck, Aaron Edelman, MD  potassium chloride SA (KLOR-CON M20) 20 MEQ tablet Take 1 tablet (20 mEq total) by mouth 2 (two) times daily. Patient taking differently: Take 20 mEq by mouth 2 (two) times daily. Morning & afternoon 12/10/17  Yes Leonie Man, MD  zolpidem (AMBIEN) 10 MG tablet Take 10 mg by mouth at bedtime. 03/22/18  Yes [provider]  docusate sodium (COLACE) 100 MG capsule Take 1 capsule (100 mg total) by mouth 2 (two) times daily. Patient not taking: Reported on 04/15/2018 01/20/18   Rod Can, MD  ferrous sulfate 325 (65 FE) MG EC tablet Take 325 mg by mouth daily with breakfast.    [provider]  glucose blood (ACCU-CHEK SMARTVIEW) test strip 1 each by Other route as needed for other. Use as instructed    [provider]  pantoprazole (PROTONIX) 40 MG tablet Take 1 tablet (40 mg total) by mouth daily. Patient not taking: Reported on 04/15/2018 12/10/17   Leonie Man, MD  senna (SENOKOT) 8.6 MG TABS tablet Take 1 tablet (8.6 mg total) by mouth 2 (two) times daily. Patient not taking: Reported on 04/15/2018 01/20/18   Rod Can, MD    Family History Family History  Problem Relation Age of Onset  . Coronary artery disease Mother   . Hypertension Mother   . Atrial fibrillation Son     Social History Social History   Tobacco Use  . Smoking status: Former Smoker    Packs/day: 0.50    Years: 6.00    Pack years: 3.00    Types: Cigarettes    Last attempt to quit: 05/18/1992    Years since quitting: 25.9  . Smokeless tobacco: Never Used  Substance Use Topics  . Alcohol use: No  Comment: 07/13/11  "have drank occasionally; not now"  . Drug use: No     Allergies   Patient has no known allergies.   Review of Systems Review of Systems  Constitutional: Positive for activity change. Negative for fatigue.  Respiratory: Positive for shortness of breath. Negative for cough.   Gastrointestinal: Negative for abdominal pain.  Allergic/Immunologic: Positive for immunocompromised state.  All other systems reviewed and are negative.    Physical Exam Updated Vital Signs BP 118/85   Pulse 95   Temp 99.6 F (37.6 C) (Oral)   Resp (!) 27   Ht 5\' 3"  (1.6 m)   Wt 108.9 kg   SpO2 97%   BMI 42.51 kg/m   Physical Exam  Constitutional: She is oriented to person, place, and time. She appears well-developed.  HENT:  Head: Normocephalic and atraumatic.  Eyes: EOM are normal.  Neck: Normal range of motion. Neck supple.  Cardiovascular:  Tachycardia  Pulmonary/Chest: Effort normal. No accessory muscle usage. No respiratory distress. She has rales in the right lower field and the left lower field.  Abdominal: Bowel sounds are normal.  Musculoskeletal:       Right lower leg: She exhibits edema.       Left lower leg: She exhibits edema.  Neurological: She is alert and oriented to person, place, and time.  Skin: Skin is warm and dry. There is erythema.  Bilateral lower extremity 1+ pitting edema with erythema.  Nursing note and vitals reviewed.    ED Treatments / Results  Labs (all labs ordered are listed, but only abnormal results are displayed) Labs Reviewed  BASIC METABOLIC PANEL - Abnormal; Notable for the following components:      Result Value   Glucose, Bld 107 (*)    BUN 35 (*)    Creatinine, Ser 1.76 (*)    GFR calc non Af Amer 29 (*)    GFR calc Af Amer 34 (*)    All other components within normal limits  MAGNESIUM - Abnormal; Notable for the following components:   Magnesium 1.6 (*)    All other components within normal limits  BRAIN NATRIURETIC PEPTIDE - Abnormal;  Notable for the following components:   B Natriuretic Peptide 358.7 (*)    All other components within normal limits  CBC - Abnormal; Notable for the following components:   WBC 13.5 (*)    RBC 3.33 (*)    Hemoglobin 8.0 (*)    HCT 28.2 (*)    MCH 24.0 (*)    MCHC 28.4 (*)    RDW 19.3 (*)    All other components within normal limits  TROPONIN I - Abnormal; Notable for the following components:   Troponin I 0.05 (*)    All other components within normal limits  PROTIME-INR - Abnormal; Notable for the following components:   Prothrombin Time 25.7 (*)    All other components within normal limits  APTT - Abnormal; Notable for the following components:   aPTT 46 (*)    All other components within normal limits  OCCULT BLOOD X 1 CARD TO LAB, STOOL  TROPONIN I  TROPONIN I  TROPONIN I    EKG EKG Interpretation  Date/Time:  Friday April 15 2018 00:05:00 EST Ventricular Rate:  105 PR Interval:    QRS Duration: 113 QT Interval:  368 QTC Calculation: 487 R Axis:   -65 Text Interpretation:  Atrial fibrillation Incomplete left bundle branch block LVH with secondary repolarization abnormality Anterior Q waves, possibly  due to LVH No acute changes Confirmed by Varney Biles [phone]) on 04/15/2018 12:16:41 AM   Radiology Dg Chest 2 View  Result Date: 04/15/2018 CLINICAL DATA:  Initial evaluation for extremity swelling, shortness of breath. EXAM: CHEST - 2 VIEW COMPARISON:  Prior radiograph from 02/20/2017. FINDINGS: Moderate to severe cardiomegaly, similar to previous. Mediastinal silhouette normal. Aortic atherosclerosis. Calcified left hilar adenopathy noted, stable. Lungs mildly hypoinflated. Perihilar vascular congestion without overt pulmonary edema. No focal infiltrates. No pleural effusion. No pneumothorax. No acute osseous abnormality. IMPRESSION: 1. Cardiomegaly with perihilar vascular congestion without frank pulmonary edema. 2. Aortic atherosclerosis. Electronically Signed    By: Jeannine Boga M.D.   On: 04/15/2018 01:16    Procedures Procedures (including critical care time)  Medications Ordered in ED Medications  furosemide (LASIX) injection 40 mg (has no administration in time range)  magnesium sulfate IVPB 2 g 50 mL (has no administration in time range)  potassium chloride SA (K-DUR,KLOR-CON) CR tablet 20 mEq (has no administration in time range)  potassium chloride SA (K-DUR,KLOR-CON) CR tablet 20 mEq (20 mEq Oral Given 04/15/18 0048)  HYDROcodone-acetaminophen (NORCO/VICODIN) 5-325 MG per tablet 1 tablet (1 tablet Oral Given 04/15/18 0049)  hydrOXYzine (ATARAX/VISTARIL) tablet 25 mg (25 mg Oral Given 04/15/18 0049)     Initial Impression / Assessment and Plan / ED Course  I have reviewed the triage vital signs and the nursing notes.  Pertinent labs & imaging results that were available during my care of the patient were reviewed by me and considered in my medical decision making (see chart for details).  Clinical Course as of Apr 16 347  Fri Apr 15, 2018  0347 Hemoglobin today is 8.  It was slightly over 8 when she was discharged after her hip replacement surgery 3 months ago.  Anemia could be contributing to her exertional dyspnea.  Hemoglobin(!): 8.0 [AN]  0348 BNP is 360.  X-ray showing pulmonary congestion.  In the setting of worsening pitting edema, I think there is absolutely a component of CHF or pulmonary hypertension that is the main driving force behind her exertional dyspnea.  Brain natriuretic peptide (order ONLY if patient c/o SOB)(!) [AN]    Clinical Course User Index [AN] Varney Biles, MD    67 year old female with history of CAD, CHF, A. fib, CKD comes in with chief complaint of shortness of breath.  Patient has been having exertional dyspnea over the past few days that has been progressive.  She is noted to have rales at the base and pitting edema in bilateral lower extremities.  Her lower extremity is also erythematous,  however it appears to be likely stasis dermatitis versus side effect of applying topical tiger balm.  Clinical concerns are that the exertional dyspnea is due to CHF or pulmonary hypertension.  ACS is also in the differential diagnosis, therefore troponin has been ordered.  EKG has no acute findings.  She is mildly tachycardic, however there is no suggestion of underlying infection for the tachycardia.  Patient has been compliant with her medications.  Appropriate labs and imaging has been ordered.  Final Clinical Impressions(s) / ED Diagnoses   Final diagnoses:  Acute on chronic combined systolic and diastolic congestive heart failure ([place])  Pulmonary hypertension ([place])  Severe anemia    ED Discharge Orders    None       Varney Biles, MD 04/15/18 0401

## 2018-04-15 NOTE — ED Notes (Signed)
ED TO INPATIENT HANDOFF REPORT  Name/Age/Gender Stephanie Franco 67 y.o. female  Code Status    Code Status Orders  (From admission, onward)         Start     Ordered   04/15/18 0351  Full code  Continuous     04/15/18 0353        Code Status History    Date Active Date Inactive Code Status Order ID Comments User Context   01/19/2018 1429 01/20/2018 1626 Full Code [id number]  Rod Can, MD Inpatient   10/31/2014 2055 11/03/2014 1456 Full Code [id number]  Ivor Costa, MD Inpatient   06/29/2013 0525 07/01/2013 1714 Full Code [id number]  Berle Mull, MD ED   07/12/2011 0801 07/14/2011 1729 Full Code [id number]  Leanora Ivanoff, RN Inpatient      Home/SNF/Other Home  Chief Complaint *  Level of Care/Admitting Diagnosis ED Disposition    ED Disposition Condition [facility]: [facility] [[id number]]  Level of Care: Telemetry [5]  Admit to tele based on following criteria: Monitor for Ischemic changes  Diagnosis: Acute on chronic systolic heart failure ([employer]) [428.23.ICD-9-CM]  Admitting Physician: Reubin Milan [[id number]]  Attending Physician: Reubin Milan [[id number]]  PT Class (Do Not Modify): Observation [104]  PT Acc Code (Do Not Modify): Observation [10022]       Medical History Past Medical History:  Diagnosis Date  . Aortic atherosclerosis ([place])   . Arthritis   . Asthma   . Atrial fibrillation, permanent    Rate control with Bystolic. CHA2DS2Vasc = 6 (HTN, DM, CHF, Age 72, Female) -> on Pradaxa  . Breast cancer ([facility]) 2008   S/P mastectomy  . CAD S/P percutaneous coronary angioplasty 2006;    PCI of circumflex with Taxus DES;; relook-cath Feb 2013: 50-60% short lesion in RCA, 40% ISR Circumflex stent.  . Cardiomegaly   . CKD (chronic kidney disease), stage III ([place])   . Complication of anesthesia    prolonged sedation after colonoscopy in [place]  . Diabetes mellitus, uncontrolled 07/14/2011  . Dilated  cardiomyopathy ([place])    Mostly Resolved (EF up from ~25% to ~45% by Echo)  . DOE (dyspnea on exertion)   . Edema of both legs    Usually mild, chronic. Controlled with when necessary furosemide and diet  . 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 GERD (gastroesophageal reflux disease)   . Gout   . Hyperlipidemia   . Hypertension   . Hypokalemia   . Morbid obesity (HCC)    BMI 41  . OSA on CPAP    On CPAP  . Pulmonary hypertension, unspecified ([place])    PAP ~90 mmHg on Echo 12/2017 -- has OSA on CPAP & obesity - but Overall Improved Sx of dyspnea / edema  . Systolic murmur     Allergies No Known Allergies  IV Location/Drains/Wounds Patient Lines/Drains/Airways Status   Active Line/Drains/Airways    Name:   Placement date:   Placement time:   Site:   Days:   Peripheral IV 04/15/18 Left Hand   04/15/18    0030    Hand   less than 1   Negative Pressure Wound Therapy Hip Right   01/19/18    -    -   86   Incision 09/24/11 Perineum Other (Comment)   09/24/11    0838     2395   Incision (Closed) 10/31/14 Breast Left   10/31/14  2145     1262   Incision (Closed) 01/19/18 Hip   01/19/18    1112     86          Labs/Imaging Results for orders placed or performed during the hospital encounter of 04/14/18 (from the past 48 hour(s))  Basic metabolic panel     Status: Abnormal   Collection Time: 04/15/18 12:50 AM  Result Value Ref Range   Sodium 144 135 - 145 mmol/L   Potassium 3.7 3.5 - 5.1 mmol/L   Chloride 110 98 - 111 mmol/L   CO2 25 22 - 32 mmol/L   Glucose, Bld 107 (H) 70 - 99 mg/dL   BUN 35 (H) 8 - 23 mg/dL   Creatinine, Ser 1.76 (H) 0.44 - 1.00 mg/dL   Calcium 8.9 8.9 - 10.3 mg/dL   GFR calc non Af Amer 29 (L) >60 mL/min   GFR calc Af Amer 34 (L) >60 mL/min   Anion gap 9 5 - 15    Comment: Performed at [facility], [place] [address], [place], [place] [address]  Magnesium     Status: Abnormal   Collection Time: 04/15/18 12:50 AM  Result Value  Ref Range   Magnesium 1.6 (L) 1.7 - 2.4 mg/dL    Comment: Performed at [facility], [place] [address] [place], [place] [address]  Brain natriuretic peptide (order ONLY if patient c/o SOB)     Status: Abnormal   Collection Time: 04/15/18 12:50 AM  Result Value Ref Range   B Natriuretic Peptide 358.7 (H) 0.0 - 100.0 pg/mL    Comment: Performed at [facility], [place], [place] [address] [place], [place] [address]  CBC     Status: Abnormal   Collection Time: 04/15/18 12:50 AM  Result Value Ref Range   WBC 13.5 (H) 4.0 - 10.5 K/uL   RBC 3.33 (L) 3.87 - 5.11 MIL/uL   Hemoglobin 8.0 (L) 12.0 - 15.0 g/dL   HCT 28.2 (L) 36.0 - 46.0 %   MCV 84.7 80.0 - 100.0 fL   MCH 24.0 (L) 26.0 - 34.0 pg   MCHC 28.4 (L) 30.0 - 36.0 g/dL   RDW 19.3 (H) 11.5 - 15.5 %   Platelets 157 150 - 400 K/uL    Comment: REPEATED TO VERIFY SPECIMEN CHECKED FOR CLOTS    nRBC 0.0 0.0 - 0.2 %    Comment: Performed at [facility], [place] [address] [place], [place] [address]  Troponin I - ONCE - STAT     Status: Abnormal   Collection Time: 04/15/18 12:50 AM  Result Value Ref Range   Troponin I 0.05 (HH) <0.03 ng/mL    Comment: CRITICAL RESULT CALLED TO, READ BACK BY AND VERIFIED WITH: MINGIA RN AT 0310 04/15/18 BY TIBBITTS,K Performed at [facility], [place] [address], [place], [place] [address]   Protime-INR     Status: Abnormal   Collection Time: 04/15/18 12:50 AM  Result Value Ref Range   Prothrombin Time 25.7 (H) 11.4 - 15.2 seconds   INR 2.39     Comment: Performed at [facility], [place] [address], [place], [place] [address]  APTT     Status: Abnormal   Collection Time: 04/15/18 12:50 AM  Result Value Ref Range   aPTT 46 (H) 24 - 36 seconds    Comment:        IF BASELINE aPTT IS ELEVATED, SUGGEST PATIENT RISK ASSESSMENT BE USED TO DETERMINE  APPROPRIATE ANTICOAGULANT THERAPY. Performed at [facility], [place]  [address], [place], [place] [address]   Occult blood card to lab, stool RN will collect     Status: None   Collection Time: 04/15/18  3:15 AM  Result Value Ref Range   Fecal Occult Bld NEGATIVE NEGATIVE    Comment: Performed at [facility], [place] [address], [place], [place] [address]   Dg Chest 2 View  Result Date: 04/15/2018 CLINICAL DATA:  Initial evaluation for extremity swelling, shortness of breath. EXAM: CHEST - 2 VIEW COMPARISON:  Prior radiograph from 02/20/2017. FINDINGS: Moderate to severe cardiomegaly, similar to previous. Mediastinal silhouette normal. Aortic atherosclerosis. Calcified left hilar adenopathy noted, stable. Lungs mildly hypoinflated. Perihilar vascular congestion without overt pulmonary edema. No focal infiltrates. No pleural effusion. No pneumothorax. No acute osseous abnormality. IMPRESSION: 1. Cardiomegaly with perihilar vascular congestion without frank pulmonary edema. 2. Aortic atherosclerosis. Electronically Signed   By: Jeannine Boga M.D.   On: 04/15/2018 01:16    Pending Labs Unresulted Labs (From admission, onward)    Start     Ordered   04/16/18 0131  Basic metabolic panel  Daily,   R     04/15/18 0349   04/15/18 0645  Troponin I - Now Then Q6H  Now then every 6 hours,   R     04/15/18 0339   04/15/18 0500  HIV antibody (Routine Testing)  Tomorrow morning,   R     04/15/18 0353          Vitals/Pain Today's Vitals   04/15/18 0141 04/15/18 0145 04/15/18 0300 04/15/18 0345  BP:  118/85 (!) 138/99 137/85  Pulse:  95 (!) 110 (!) 105  Resp:  (!) 27 (!) 21 (!) 32  Temp:      TempSrc:      SpO2:  97% 97% 97%  Weight:      Height:      PainSc: 1        Isolation Precautions No active isolations  Medications Medications  magnesium sulfate IVPB 2 g 50 mL (has no administration in time range)  potassium chloride SA (K-DUR,KLOR-CON) CR tablet 20 mEq (has no administration in time range)  furosemide (LASIX) injection 40  mg (has no administration in time range)  heparin injection 5,000 Units (has no administration in time range)  acetaminophen (TYLENOL) tablet 650 mg (has no administration in time range)    Or  acetaminophen (TYLENOL) suppository 650 mg (has no administration in time range)  ondansetron (ZOFRAN) tablet 4 mg (has no administration in time range)    Or  ondansetron (ZOFRAN) injection 4 mg (has no administration in time range)  potassium chloride SA (K-DUR,KLOR-CON) CR tablet 20 mEq (20 mEq Oral Given 04/15/18 0048)  HYDROcodone-acetaminophen (NORCO/VICODIN) 5-325 MG per tablet 1 tablet (1 tablet Oral Given 04/15/18 0049)  hydrOXYzine (ATARAX/VISTARIL) tablet 25 mg (25 mg Oral Given 04/15/18 0049)  furosemide (LASIX) injection 40 mg (40 mg Intravenous Given 04/15/18 0349)    Mobility walks with person assist

## 2018-04-15 NOTE — Progress Notes (Signed)
Spoke to the Nurse and confirmed that IV was requested for blood draws the patient. Pt. Already has PIV and flushes well but unable to draw blood. RN will notify LAB for blood works.

## 2018-04-15 NOTE — ED Notes (Signed)
Patient transported to X-ray via stretcher 

## 2018-04-16 ENCOUNTER — Other Ambulatory Visit: Payer: Self-pay | Admitting: Cardiology

## 2018-04-16 DIAGNOSIS — F419 Anxiety disorder, unspecified: Secondary | ICD-10-CM | POA: Diagnosis present

## 2018-04-16 DIAGNOSIS — E1122 Type 2 diabetes mellitus with diabetic chronic kidney disease: Secondary | ICD-10-CM | POA: Diagnosis present

## 2018-04-16 DIAGNOSIS — Z6841 Body Mass Index (BMI) 40.0 and over, adult: Secondary | ICD-10-CM | POA: Diagnosis not present

## 2018-04-16 DIAGNOSIS — I272 Pulmonary hypertension, unspecified: Secondary | ICD-10-CM

## 2018-04-16 DIAGNOSIS — I4821 Permanent atrial fibrillation: Secondary | ICD-10-CM

## 2018-04-16 DIAGNOSIS — E1140 Type 2 diabetes mellitus with diabetic neuropathy, unspecified: Secondary | ICD-10-CM | POA: Diagnosis present

## 2018-04-16 DIAGNOSIS — I5043 Acute on chronic combined systolic (congestive) and diastolic (congestive) heart failure: Secondary | ICD-10-CM | POA: Diagnosis not present

## 2018-04-16 DIAGNOSIS — I130 Hypertensive heart and chronic kidney disease with heart failure and stage 1 through stage 4 chronic kidney disease, or unspecified chronic kidney disease: Secondary | ICD-10-CM | POA: Diagnosis present

## 2018-04-16 DIAGNOSIS — I2729 Other secondary pulmonary hypertension: Secondary | ICD-10-CM | POA: Diagnosis present

## 2018-04-16 DIAGNOSIS — I5082 Biventricular heart failure: Secondary | ICD-10-CM | POA: Diagnosis present

## 2018-04-16 DIAGNOSIS — R609 Edema, unspecified: Secondary | ICD-10-CM | POA: Diagnosis not present

## 2018-04-16 DIAGNOSIS — R7989 Other specified abnormal findings of blood chemistry: Secondary | ICD-10-CM | POA: Diagnosis not present

## 2018-04-16 DIAGNOSIS — I447 Left bundle-branch block, unspecified: Secondary | ICD-10-CM | POA: Diagnosis present

## 2018-04-16 DIAGNOSIS — I248 Other forms of acute ischemic heart disease: Secondary | ICD-10-CM | POA: Diagnosis present

## 2018-04-16 DIAGNOSIS — K219 Gastro-esophageal reflux disease without esophagitis: Secondary | ICD-10-CM | POA: Diagnosis present

## 2018-04-16 DIAGNOSIS — E11649 Type 2 diabetes mellitus with hypoglycemia without coma: Secondary | ICD-10-CM | POA: Diagnosis not present

## 2018-04-16 DIAGNOSIS — I10 Essential (primary) hypertension: Secondary | ICD-10-CM | POA: Diagnosis not present

## 2018-04-16 DIAGNOSIS — D638 Anemia in other chronic diseases classified elsewhere: Secondary | ICD-10-CM | POA: Diagnosis present

## 2018-04-16 DIAGNOSIS — I5023 Acute on chronic systolic (congestive) heart failure: Secondary | ICD-10-CM

## 2018-04-16 DIAGNOSIS — E785 Hyperlipidemia, unspecified: Secondary | ICD-10-CM | POA: Diagnosis present

## 2018-04-16 DIAGNOSIS — M109 Gout, unspecified: Secondary | ICD-10-CM | POA: Diagnosis present

## 2018-04-16 DIAGNOSIS — I25119 Atherosclerotic heart disease of native coronary artery with unspecified angina pectoris: Secondary | ICD-10-CM | POA: Diagnosis present

## 2018-04-16 DIAGNOSIS — E876 Hypokalemia: Secondary | ICD-10-CM | POA: Diagnosis present

## 2018-04-16 DIAGNOSIS — N183 Chronic kidney disease, stage 3 (moderate): Secondary | ICD-10-CM | POA: Diagnosis present

## 2018-04-16 DIAGNOSIS — I700 Atherosclerosis of aorta: Secondary | ICD-10-CM | POA: Diagnosis present

## 2018-04-16 DIAGNOSIS — N179 Acute kidney failure, unspecified: Secondary | ICD-10-CM | POA: Diagnosis not present

## 2018-04-16 DIAGNOSIS — D631 Anemia in chronic kidney disease: Secondary | ICD-10-CM | POA: Diagnosis present

## 2018-04-16 DIAGNOSIS — I420 Dilated cardiomyopathy: Secondary | ICD-10-CM | POA: Diagnosis present

## 2018-04-16 LAB — BASIC METABOLIC PANEL
Anion gap: 12 (ref 5–15)
BUN: 28 mg/dL — AB (ref 8–23)
CO2: 27 mmol/L (ref 22–32)
Calcium: 9.1 mg/dL (ref 8.9–10.3)
Chloride: 107 mmol/L (ref 98–111)
Creatinine, Ser: 1.25 mg/dL — ABNORMAL HIGH (ref 0.44–1.00)
GFR calc Af Amer: 51 mL/min — ABNORMAL LOW (ref >60)
GFR calc non Af Amer: 44 mL/min — ABNORMAL LOW (ref >60)
Glucose, Bld: 62 mg/dL — ABNORMAL LOW (ref 70–99)
POTASSIUM: 3.5 mmol/L (ref 3.5–5.1)
Sodium: 146 mmol/L — ABNORMAL HIGH (ref 135–145)

## 2018-04-16 LAB — GLUCOSE, CAPILLARY
Glucose-Capillary: 65 mg/dL — ABNORMAL LOW (ref 70–99)
Glucose-Capillary: 145 mg/dL — ABNORMAL HIGH (ref 70–99)
Glucose-Capillary: 90 mg/dL (ref 70–99)
Glucose-Capillary: 125 mg/dL — ABNORMAL HIGH (ref 70–99)

## 2018-04-16 LAB — IRON AND TIBC
Iron: 55 ug/dL (ref 28–170)
Saturation Ratios: 14 % (ref 10.4–31.8)
TIBC: 386 ug/dL (ref 250–450)
UIBC: 331 ug/dL

## 2018-04-16 LAB — CBC WITH DIFFERENTIAL/PLATELET
Abs Immature Granulocytes: 0.03 10*3/uL (ref 0.00–0.07)
Basophils Absolute: 0 10*3/uL (ref 0.0–0.1)
Basophils Relative: 0 %
Eosinophils Absolute: 0.3 10*3/uL (ref 0.0–0.5)
Eosinophils Relative: 3 %
HCT: 31.7 % — ABNORMAL LOW (ref 36.0–46.0)
Hemoglobin: 8.6 g/dL — ABNORMAL LOW (ref 12.0–15.0)
Immature Granulocytes: 0 %
Lymphocytes Relative: 19 %
Lymphs Abs: 1.5 10*3/uL (ref 0.7–4.0)
MCH: 22.7 pg — ABNORMAL LOW (ref 26.0–34.0)
MCHC: 27.1 g/dL — ABNORMAL LOW (ref 30.0–36.0)
MCV: 83.6 fL (ref 80.0–100.0)
Monocytes Absolute: 0.9 10*3/uL (ref 0.1–1.0)
Monocytes Relative: 12 %
NEUTROS ABS: 5.2 10*3/uL (ref 1.7–7.7)
Neutrophils Relative %: 66 %
Platelets: 167 10*3/uL (ref 150–400)
RBC: 3.79 MIL/uL — ABNORMAL LOW (ref 3.87–5.11)
RDW: 19.4 % — ABNORMAL HIGH (ref 11.5–15.5)
WBC: 7.9 10*3/uL (ref 4.0–10.5)
nRBC: 0 % (ref 0.0–0.2)

## 2018-04-16 LAB — MAGNESIUM: MAGNESIUM: 1.7 mg/dL (ref 1.7–2.4)

## 2018-04-16 LAB — TSH: TSH: 5.153 u[IU]/mL — ABNORMAL HIGH (ref 0.350–4.500)

## 2018-04-16 MED ORDER — SENNOSIDES-DOCUSATE SODIUM 8.6-50 MG PO TABS
1.0000 | ORAL_TABLET | Freq: Two times a day (BID) | ORAL | Status: DC
  Administered 2018-04-16 – 2018-04-22 (×7): 1 via ORAL
  Filled 2018-04-16 (×10): qty 1

## 2018-04-16 MED ORDER — ISOSORBIDE MONONITRATE ER 30 MG PO TB24
15.0000 mg | ORAL_TABLET | Freq: Every day | ORAL | Status: DC
  Administered 2018-04-17 – 2018-04-22 (×6): 15 mg via ORAL
  Filled 2018-04-16 (×6): qty 1

## 2018-04-16 MED ORDER — INSULIN GLARGINE 100 UNIT/ML ~~LOC~~ SOLN
14.0000 [IU] | Freq: Every day | SUBCUTANEOUS | Status: DC
  Administered 2018-04-16 – 2018-04-21 (×6): 14 [IU] via SUBCUTANEOUS
  Filled 2018-04-16 (×7): qty 0.14

## 2018-04-16 MED ORDER — POTASSIUM CHLORIDE CRYS ER 20 MEQ PO TBCR
20.0000 meq | EXTENDED_RELEASE_TABLET | Freq: Once | ORAL | Status: AC
  Administered 2018-04-16: 20 meq via ORAL
  Filled 2018-04-16: qty 1

## 2018-04-16 MED ORDER — MAGNESIUM SULFATE 2 GM/50ML IV SOLN
2.0000 g | Freq: Once | INTRAVENOUS | Status: AC
  Administered 2018-04-16: 2 g via INTRAVENOUS
  Filled 2018-04-16: qty 50

## 2018-04-16 NOTE — Plan of Care (Signed)
Patient continues to diurese, says her shortness of breath has improved.  Legs wrapped in ACE wraps bilaterally to provide some compression.  Encouraged patient to sit with legs elevated to assist with reducing edema as well.  Patient medicated x2 for chronic pain issues in back and legs.  Did have a BM after receiving Senokot.  Husband at bedside.

## 2018-04-16 NOTE — Progress Notes (Signed)
PROGRESS NOTE  Stephanie Franco JKK:[id number] DOB: 06-23-1950 DOA: 04/14/2018 PCP: Lin Landsman, MD  HPI/Recap of past 24 hours:  Reports feeling better, good urine output, denies chest pain, no fever, no cough She could not tolerate hospital cpap, husband states will bring in her own cpap Lower extremity remain edematous  Assessment/Plan: Principal Problem:   Acute on chronic systolic heart failure (HCC) Active Problems:   Essential hypertension   Hyperlipidemia with target LDL less than 70   Atrial fibrillation, permanent (HCC); CHA2DS2Vasc - 6   Pulmonary HTN (HCC)   GERD (gastroesophageal reflux disease)   Gout   Hypomagnesemia   Elevated troponin   AKI (acute kidney injury) ([place])   Acute on chronic systolic (congestive) heart failure (HCC)  Acute on chronic combined chf: -improving, continue iv lasix, apply ace wrap to bilateral legs, elevate legs -continue bystolic, hold lisinopril, decrease imdur -cardiology consulted, input appreciated  Hypokalemia/hypomagnesemia: Replace k and mag   Chronic afib -rate controlled on bystolic, continue eliquis   AKI on CKDIII -ua no bacteria, -aki likely due to cardiorenal -improving, renal dosing meds (hold lisinopril for now)  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    Code Status: full  Family Communication: patient and husband  Disposition Plan: home with home health once medically stable with cardiology clearance   Consultants:  Cardiology    Procedures:  none  Antibiotics:  once   Objective: BP 117/69 (BP Location: Right Arm)   Pulse 77   Temp 99 F (37.2 C) (Oral)   Resp 18   Ht 5\' 3"  (1.6 m)   Wt 107 kg   SpO2 100%   BMI 41.79 kg/m   Intake/Output Summary (Last 24 hours) at 04/16/2018 1424 Last data filed at 04/16/2018 1416 Gross per 24 hour  Intake 300 ml  Output 800 ml  Net -500 ml   Filed Weights   04/15/18 0005 04/15/18 0524  Weight: 108.9 kg 107 kg    Exam: Patient is examined daily including today on 04/16/2018, exams remain the same as of yesterday except that has changed    General:  Weak, NAD  Cardiovascular: RRR  Respiratory: diminished at basis, no wheezing  Abdomen: Soft/ND/NT, positive BS  Musculoskeletal: bilateral lower extremity 3+ pitting Edema  Neuro: alert, oriented   Data Reviewed: Basic Metabolic Panel: Recent Labs  Lab 04/15/18 0050 04/16/18 0428  NA 144 146*  K 3.7 3.5  CL 110 107  CO2 25 27  GLUCOSE 107* 62*  BUN 35* 28*  CREATININE 1.76* 1.25*  CALCIUM 8.9 9.1  MG 1.6* 1.7   Liver Function Tests: No results for input(s): AST, ALT, ALKPHOS, BILITOT, PROT, ALBUMIN in the last 168 hours. No results for input(s): LIPASE, AMYLASE in the last 168 hours. No results for input(s): AMMONIA in the last 168 hours. CBC: Recent Labs  Lab 04/15/18 0050 04/16/18 0428  WBC 13.5* 7.9  NEUTROABS  --  5.2  HGB 8.0* 8.6*  HCT 28.2* 31.7*  MCV 84.7 83.6  PLT 157 167   Cardiac Enzymes:   Recent Labs  Lab 04/15/18 0050 04/15/18 0720 04/15/18 1319  04/15/18 1838  TROPONINI 0.05* 0.04* 0.05* 0.04*   BNP (last 3 results) Recent Labs    04/15/18 0050  BNP 358.7*    ProBNP (last 3 results) No results for input(s): PROBNP in the last 8760 hours.  CBG: Recent Labs  Lab 04/15/18 0727 04/15/18 1229 04/15/18 1637 04/16/18 0813 04/16/18 1135  GLUCAP 124* 145* 96 65* 145*    No results found for this or any previous visit (from the past 240 hour(s)).    Studies: No results found.  Scheduled Meds: . allopurinol  300 mg Oral BID  . apixaban  5 mg Oral BID  . atorvastatin  40 mg Oral Q1400  . colchicine  0.6 mg Oral QPM  . ezetimibe  10 mg Oral Q1400  . furosemide  40 mg Intravenous BID  . hydrocerin   Topical BID  . insulin aspart  0-20 Units Subcutaneous TID WC  . insulin glargine  14 Units Subcutaneous Q supper  . ipratropium  0.5 mg Nebulization BID  . [START ON 04/17/2018] isosorbide mononitrate  15 mg Oral Daily  . levalbuterol  1.25 mg Nebulization BID  . nebivolol  10 mg Oral Daily  . pantoprazole  40 mg Oral Daily  . potassium chloride  20 mEq Oral BID  . senna-docusate  1 tablet Oral BID  . traZODone  50 mg Oral QHS    Continuous Infusions:   Time spent: 84mins I have personally reviewed and interpreted on  04/16/2018 daily labs, tele strips, imagings as discussed above under date review session and assessment and plans.  I reviewed all nursing notes, pharmacy notes, consultant notes,  vitals, pertinent old records  I have discussed plan of care as described above with RN , patient and family on 04/16/2018   Florencia Reasons MD, PhD  [facility] Pager [phone]. If 7PM-7AM, please contact night-coverage at [email or web address], password [facility] 04/16/2018, 2:24 PM  LOS: 0 days

## 2018-04-16 NOTE — Progress Notes (Signed)
Pt. States that she had problems with the CPAP device last night and does not want to wear this evening.  Advised patient to call if she changes her mind.

## 2018-04-16 NOTE — Consult Note (Signed)
Cardiology Consultation:   Patient ID: Stephanie Franco MRN: [id number]; DOB: 13-May-1951  Admit date: 04/14/2018 Date of Consult: 04/16/2018  Primary Care Provider: Lin Landsman, MD Primary Cardiologist: Glenetta Hew, MD  Primary Electrophysiologist:  None    Patient Profile:   Stephanie Franco is a 67 y.o. female  who is being seen today for the evaluation of acute heart failure at the request of Dr Erlinda Hong.  History of Present Illness:   Stephanie Franco is a 67 year old female with history of permanent atrial fibrillation on Eliquis, morbid obesity, obstructive sleep apnea, secondary pulmonary hypertension, ejection fraction of approximately 40 to 45% on echocardiogram, prior coronary artery disease with PCI performed in 2006, Myoview in 2016-, chronic lower extremity edema, history of breast cancer with left mastectomy, diabetes with hypertension and hyperlipidemia here with worsening dyspnea on exertion with worsening lower extremity edema over the past several weeks.  She was seen previously by Dr. Ellyn Hack in September for hip surgery preoperative risk evaluation.  She was doing fairly well at that time.  Unfortunately her son died in [place] shortly thereafter.  She was hospitalized for 5 days during that stressful incidents secondary to influenza and bacteremia.  She is had a lot of anxiety surrounding her son's death.  Continues to gain some weight, fluid weight since that time.  No recent fevers chills cough.  She did have some itching in her arms and legs.  Chest x-ray showed pulmonary vascular congestion.  Overall this morning, she is breathing a little bit better.  Spoke with her husband as well.  Has had a rough couple months.  Denies any syncope bleeding melena  Past Medical History:  Diagnosis Date  . Aortic atherosclerosis ([place])   . Arthritis   . Asthma   . Atrial fibrillation, permanent    Rate control with Bystolic. CHA2DS2Vasc = 6 (HTN, DM, CHF, Age 66, Female) -> on Pradaxa   . Breast cancer ([place]) 2008   S/P mastectomy  . CAD S/P percutaneous coronary angioplasty 2006;    PCI of circumflex with Taxus DES;; relook-cath Feb 2013: 50-60% short lesion in RCA, 40% ISR Circumflex stent.  . Cardiomegaly   . CKD (chronic kidney disease), stage III ([place])   . Complication of anesthesia    prolonged sedation after colonoscopy in [place]  . Diabetes mellitus, uncontrolled 07/14/2011  . Dilated cardiomyopathy ([place])    Mostly Resolved (EF up from ~25% to ~45% by Echo)  . DOE (dyspnea on exertion)   . Edema of both legs    Usually mild, chronic. Controlled with when necessary furosemide and diet  . 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 GERD (gastroesophageal reflux disease)   . Gout   . Hyperlipidemia   . Hypertension   . Hypokalemia   . Morbid obesity ([facility])    BMI 41  . OSA on CPAP    On CPAP  . Pulmonary hypertension, unspecified ([place])    PAP ~90 mmHg on Echo 12/2017 -- has OSA on CPAP & obesity - but Overall Improved Sx of dyspnea / edema  . Systolic murmur     Past Surgical History:  Procedure Laterality Date  . COLONOSCOPY    . CORONARY ANGIOPLASTY WITH STENT PLACEMENT  2006   stent to circumflex  . DILATION AND CURETTAGE OF UTERUS    . LEFT HEART CATHETERIZATION WITH CORONARY ANGIOGRAM N/A 07/13/2011   Procedure: LEFT HEART CATHETERIZATION WITH CORONARY ANGIOGRAM;  Surgeon: Leonie Man, MD;  Location: [facility]  CATH LAB;  Service: Cardiovascular;  normal EF; 42mm 50-60% lesion in RCA; patent stent in circumflex form 2006 with ~40% in-stent re-stenosis  . MASTECTOMY  2008   left  . NM MYOVIEW LTD  11/22/2014   Low Risk. No ischemia or infarction. Not gated 2/2  A. fib - unable to assess EF  . TOTAL HIP ARTHROPLASTY Right 01/19/2018   Procedure: RIGHT TOTAL HIP ARTHROPLASTY ANTERIOR APPROACH;  Surgeon: Rod Can, MD;  Location: [facility] ORS;  Service: Orthopedics;  Laterality: Right;  Needs RNFA  . TRANSTHORACIC ECHOCARDIOGRAM  01/2015   LV EF 40-45%, Mild  Anteroseptal HK. Mod-Severe  RA dilation with elevated PA Pressures (~~peak 63 mmHg).. Mild LA dilation  . TRANSTHORACIC ECHOCARDIOGRAM  June 2015   In setting of Urosepsis: EF ~25 %, global HK (poor quality study)  . TRANSTHORACIC ECHOCARDIOGRAM  12/2017   Improved LVEF to 45 to 50%.  Pulmonary hypertension estimated PA pressures of 90 mmHg. Severe LA dilation& mild /rv dilation.       Inpatient Medications: Scheduled Meds: . allopurinol  300 mg Oral BID  . apixaban  5 mg Oral BID  . atorvastatin  40 mg Oral Q1400  . colchicine  0.6 mg Oral QPM  . ezetimibe  10 mg Oral Q1400  . furosemide  40 mg Intravenous BID  . hydrocerin   Topical BID  . insulin aspart  0-20 Units Subcutaneous TID WC  . insulin glargine  28 Units Subcutaneous Q supper  . ipratropium  0.5 mg Nebulization BID  . isosorbide mononitrate  60 mg Oral Daily  . levalbuterol  1.25 mg Nebulization BID  . nebivolol  10 mg Oral Daily  . pantoprazole  40 mg Oral Daily  . potassium chloride  20 mEq Oral BID  . traZODone  50 mg Oral QHS   Continuous Infusions:  PRN Meds: acetaminophen **OR** acetaminophen, albuterol, HYDROcodone-acetaminophen, LORazepam, ondansetron **OR** ondansetron (ZOFRAN) IV  Allergies:   No Known Allergies  Social History:   Social History   Socioeconomic History  . Marital status: Married    Spouse name: Not on file  . Number of children: 4  . Years of education: Not on file  . Highest education level: Not on file  Occupational History  . Not on file  Social Needs  . Financial resource strain: Not on file  . Food insecurity:    Worry: Not on file    Inability: Not on file  . Transportation needs:    Medical: Not on file    Non-medical: Not on file  Tobacco Use  . Smoking status: Former Smoker    Packs/day: 0.50    Years: 6.00    Pack years: 3.00    Types: Cigarettes    Last attempt to quit: 05/18/1992    Years since quitting: 25.9  . Smokeless tobacco: Never Used  Substance  and Sexual Activity  . Alcohol use: No    Comment: 07/13/11 "have drank occasionally; not now"  . Drug use: No  . Sexual activity: Not Currently    Birth control/protection: Surgical  Lifestyle  . Physical activity:    Days per week: Not on file    Minutes per session: Not on file  . Stress: Not on file  Relationships  . Social connections:    Talks on phone: Not on file    Gets together: Not on file    Attends religious service: Not on file    Active member of club or organization: Not on  file    Attends meetings of clubs or organizations: Not on file    Relationship status: Not on file  . Intimate partner violence:    Fear of current or ex partner: Not on file    Emotionally abused: Not on file    Physically abused: Not on file    Forced sexual activity: Not on file  Other Topics Concern  . Not on file  Social History Narrative   She is a married mother of 70, grandmother 2. Usually accompanied by her husband. She does not work. She had been working on her exercise, but is no longer as active. Does not drink and does not smoke    Family History:    Family History  Problem Relation Age of Onset  . Coronary artery disease Mother   . Hypertension Mother   . Atrial fibrillation Son      ROS:  Please see the history of present illness.  All other ROS reviewed and negative.     Physical Exam/Data:   Vitals:   04/15/18 1443 04/15/18 1937 04/15/18 2136 04/16/18 0458  BP: 138/70  (!) 112/58 128/79  Pulse: 86  94 96  Resp: 18  18 20   Temp: 98.9 F (37.2 C)  99.8 F (37.7 C) 99.7 F (37.6 C)  TempSrc: Oral  Oral Oral  SpO2: 98% 97% 98% 98%  Weight:      Height:        Intake/Output Summary (Last 24 hours) at 04/16/2018 0839 Last data filed at 04/16/2018 0145 Gross per 24 hour  Intake 590 ml  Output 1600 ml  Net -1010 ml   Filed Weights   04/15/18 0005 04/15/18 0524  Weight: 108.9 kg 107 kg   Body mass index is 41.79 kg/m.  General:  Well nourished, well  developed, in no acute distress, morbidly obese HEENT: normal Lymph: no adenopathy Neck: no JVD Endocrine:  No thryomegaly Vascular: No carotid bruits; FA pulses 2+ bilaterally without bruits  Cardiac:  normal S1, S2; RRR; no murmur  Lungs:  clear to auscultation bilaterally, no wheezing, rhonchi or rales  Abd: soft, nontender, no hepatomegaly  Ext: Chronic appearing moderate lower extremity edema Musculoskeletal:  No deformities, BUE and BLE strength normal and equal Skin: warm and dry  Neuro:  CNs 2-12 intact, no focal abnormalities noted Psych:  Normal affect   EKG:  The EKG was personally reviewed and demonstrates: Atrial fibrillation 106 bpm left axis deviation, incomplete left bundle branch block.  No significant change from prior ECG.  Nonspecific ST-T wave changes.  Telemetry:  Telemetry was personally reviewed and demonstrates: Atrial fibrillation reasonable rate control  Relevant CV Studies:  Echocardiogram 04/15/2018 - Left ventricle: The cavity size was normal. Wall thickness was   increased in a pattern of moderate LVH. Systolic function was   mildly to moderately reduced. The estimated ejection fraction was   in the range of 40% to 45%. Diffuse hypokinesis. The study is not   technically sufficient to allow evaluation of LV diastolic   function. LV filling pressure is elevated. - Mitral valve: Mildly thickened leaflets . There was moderate   posterior and laterally directed regurgitation. - Left atrium: Massively dilated. - Right ventricle: The cavity size was moderately dilated. Mildly   reduced systolic function. - Right atrium: Severely dilated. - Tricuspid valve: There was moderate regurgitation. - Pulmonary arteries: PA peak pressure: 66 mm Hg (S). - Inferior vena cava: The vessel was dilated. The respirophasic   diameter changes  were blunted (< 50%), consistent with elevated   central venous pressure.  Impressions:  - Compared to a prior study in  12/2017, the LVEF is lower at 40-45%  Laboratory Data:  Chemistry Recent Labs  Lab 04/15/18 0050  NA 144  K 3.7  CL 110  CO2 25  GLUCOSE 107*  BUN 35*  CREATININE 1.76*  CALCIUM 8.9  GFRNONAA 29*  GFRAA 34*  ANIONGAP 9    No results for input(s): PROT, ALBUMIN, AST, ALT, ALKPHOS, BILITOT in the last 168 hours. Hematology Recent Labs  Lab 04/15/18 0050  WBC 13.5*  RBC 3.33*  HGB 8.0*  HCT 28.2*  MCV 84.7  MCH 24.0*  MCHC 28.4*  RDW 19.3*  PLT 157   Cardiac Enzymes Recent Labs  Lab 04/15/18 0050 04/15/18 0720 04/15/18 1319 04/15/18 1838  TROPONINI 0.05* 0.04* 0.05* 0.04*   No results for input(s): TROPIPOC in the last 168 hours.  BNP Recent Labs  Lab 04/15/18 0050  BNP 358.7*    DDimer No results for input(s): DDIMER in the last 168 hours.  Radiology/Studies:  Dg Chest 2 View  Result Date: 04/15/2018 CLINICAL DATA:  Initial evaluation for extremity swelling, shortness of breath. EXAM: CHEST - 2 VIEW COMPARISON:  Prior radiograph from 02/20/2017. FINDINGS: Moderate to severe cardiomegaly, similar to previous. Mediastinal silhouette normal. Aortic atherosclerosis. Calcified left hilar adenopathy noted, stable. Lungs mildly hypoinflated. Perihilar vascular congestion without overt pulmonary edema. No focal infiltrates. No pleural effusion. No pneumothorax. No acute osseous abnormality. IMPRESSION: 1. Cardiomegaly with perihilar vascular congestion without frank pulmonary edema. 2. Aortic atherosclerosis. Electronically Signed   By: Jeannine Boga M.D.   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  Moderate pulmonary hypertension previously noted as well.  Mildly elevated troponin - Demand ischemia in the setting of heart failure exacerbation.   Low level, not acute coronary syndrome.  0.04.  Coronary artery disease -PCI in 2006, cardiac catheterization in 2013 showed patent stent, subsequent Myoview was negative in 2016.  No recurrent angina.  Not on aspirin or Plavix because of Eliquis.  Angina -Stable on Imdur  Chronic edema both legs - Continue with Lasix.  Hyperlipidemia - Target less than 70 LDL.  Controlled on atorvastatin and Zetia as outpatient.  Morbid obesity -Continue to encourage weight loss.  BMI currently 41.7.  Increases her overall cardiovascular risk.  Contributing to pulmonary hypertension.  Obstructive sleep apnea -On CPAP.  Contributed to pulmonary hypertension.  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  Continue with blood pressure control, diuresis, CPAP.  Weight loss.  Atrial fibrillation, permanent -Chads vas score is 6 -Eliquis.  Acute kidney injury - Creatinine over the last year has ranged between 1.1 and 1.36.  On admission she was 1.76.  Anemia -Back in September 2019 hemoglobin 8.2, currently 8.0.  Back in late August her hemoglobin was 11.5. - Evaluation per primary team.  Sounds like she has been worked up for this in [place] previously.  Diabetes with hypertension -Hemoglobin A1c 6.1 in late August.  Well controlled.      For questions or updates, please contact [place] Please consult [email or web address] for contact info under     Signed, Candee Furbish, MD  04/16/2018 8:39 AM

## 2018-04-16 NOTE — Care Management Obs Status (Addendum)
[facility] NOTIFICATION   Patient Details  Name: Stephanie Franco MRN: [id number] Date of Birth: Aug 24, 1950   Medicare Observation Status Notification Given:  Yes    Erenest Rasher, RN 04/16/2018, 11:45 am

## 2018-04-17 LAB — CBC WITH DIFFERENTIAL/PLATELET
Abs Immature Granulocytes: 0.01 10*3/uL (ref 0.00–0.07)
BASOS ABS: 0 10*3/uL (ref 0.0–0.1)
Basophils Relative: 0 %
Eosinophils Absolute: 0.3 10*3/uL (ref 0.0–0.5)
Eosinophils Relative: 5 %
HCT: 28.7 % — ABNORMAL LOW (ref 36.0–46.0)
HEMOGLOBIN: 7.9 g/dL — AB (ref 12.0–15.0)
Immature Granulocytes: 0 %
Lymphocytes Relative: 24 %
Lymphs Abs: 1.6 10*3/uL (ref 0.7–4.0)
MCH: 23.5 pg — ABNORMAL LOW (ref 26.0–34.0)
MCHC: 27.5 g/dL — ABNORMAL LOW (ref 30.0–36.0)
MCV: 85.4 fL (ref 80.0–100.0)
Monocytes Absolute: 0.8 10*3/uL (ref 0.1–1.0)
Monocytes Relative: 13 %
Neutro Abs: 3.6 10*3/uL (ref 1.7–7.7)
Neutrophils Relative %: 58 %
Platelets: 160 10*3/uL (ref 150–400)
RBC: 3.36 MIL/uL — AB (ref 3.87–5.11)
RDW: 19.4 % — ABNORMAL HIGH (ref 11.5–15.5)
WBC: 6.3 10*3/uL (ref 4.0–10.5)
nRBC: 0 % (ref 0.0–0.2)

## 2018-04-17 LAB — BASIC METABOLIC PANEL
Anion gap: 7 (ref 5–15)
BUN: 26 mg/dL — ABNORMAL HIGH (ref 8–23)
CO2: 29 mmol/L (ref 22–32)
Calcium: 9 mg/dL (ref 8.9–10.3)
Chloride: 107 mmol/L (ref 98–111)
Creatinine, Ser: 1.23 mg/dL — ABNORMAL HIGH (ref 0.44–1.00)
GFR calc non Af Amer: 45 mL/min — ABNORMAL LOW (ref >60)
GFR, EST AFRICAN AMERICAN: 52 mL/min — AB (ref >60)
Glucose, Bld: 117 mg/dL — ABNORMAL HIGH (ref 70–99)
Potassium: 3.5 mmol/L (ref 3.5–5.1)
Sodium: 143 mmol/L (ref 135–145)

## 2018-04-17 LAB — FOLATE: Folate: 17.5 ng/mL (ref >5.9)

## 2018-04-17 LAB — RETICULOCYTES
Immature Retic Fract: 29.2 % — ABNORMAL HIGH (ref 2.3–15.9)
RBC.: 3.36 MIL/uL — ABNORMAL LOW (ref 3.87–5.11)
Retic Count, Absolute: 71.9 10*3/uL (ref 19.0–186.0)
Retic Ct Pct: 2.1 % (ref 0.4–3.1)

## 2018-04-17 LAB — MAGNESIUM: Magnesium: 2.1 mg/dL (ref 1.7–2.4)

## 2018-04-17 LAB — VITAMIN B12: VITAMIN B 12: 637 pg/mL (ref 180–914)

## 2018-04-17 LAB — GLUCOSE, CAPILLARY
Glucose-Capillary: 90 mg/dL (ref 70–99)
Glucose-Capillary: 128 mg/dL — ABNORMAL HIGH (ref 70–99)
Glucose-Capillary: 94 mg/dL (ref 70–99)
Glucose-Capillary: 119 mg/dL — ABNORMAL HIGH (ref 70–99)

## 2018-04-17 LAB — T4, FREE: Free T4: 1.29 ng/dL (ref 0.82–1.77)

## 2018-04-17 MED ORDER — FUROSEMIDE 10 MG/ML IJ SOLN
80.0000 mg | Freq: Two times a day (BID) | INTRAMUSCULAR | Status: DC
  Administered 2018-04-17 – 2018-04-18 (×4): 80 mg via INTRAVENOUS
  Filled 2018-04-17 (×4): qty 8

## 2018-04-17 MED ORDER — LEVALBUTEROL HCL 1.25 MG/0.5ML IN NEBU: 1.2500 mg | INHALATION_SOLUTION | Freq: Four times a day (QID) | RESPIRATORY_TRACT | Status: DC | PRN

## 2018-04-17 MED ORDER — POTASSIUM CHLORIDE CRYS ER 20 MEQ PO TBCR
40.0000 meq | EXTENDED_RELEASE_TABLET | Freq: Once | ORAL | Status: AC
  Administered 2018-04-17: 40 meq via ORAL
  Filled 2018-04-17: qty 2

## 2018-04-17 MED ORDER — IPRATROPIUM BROMIDE 0.02 % IN SOLN: 0.5000 mg | Freq: Four times a day (QID) | RESPIRATORY_TRACT | Status: DC | PRN

## 2018-04-17 NOTE — Evaluation (Signed)
Physical Therapy Evaluation Patient Details Name: Stephanie Franco MRN: [id number] DOB: [birth year] Today's Date: 04/17/2018   History of Present Illness  Stephanie Franco is a 67 year old female with history of permanent atrial fibrillation on Eliquis, morbid obesity, obstructive sleep apnea, secondary pulmonary hypertension, ejection fraction of approximately 40 to 45% on echocardiogram, prior coronary artery disease with PCI performed in 2006, Myoview in 2016-, chronic lower extremity edema, history of breast cancer with left mastectomy, diabetes with hypertension and hyperlipidemia here with worsening dyspnea on exertion with worsening lower extremity edema over the past several weeks.  Admitted for CHF exacerbation.  Clinical Impression  Patient presents with decreased mobility and pain with shoulder elevation on the R as well as lower back pain.  She will benefit from skilled PT in the acute setting and follow up HHPT at d/c to progress mobility, safety and independence as well as to improve pain, posture and decrease fall risk.     Follow Up Recommendations Home health PT;Supervision - Intermittent    Equipment Recommendations  None recommended by PT    Recommendations for Other Services       Precautions / Restrictions        Mobility  Bed Mobility               General bed mobility comments: up in chair  Transfers Overall transfer level: Needs assistance Equipment used: Rolling walker (2 wheeled) Transfers: Sit to/from Stand Sit to Stand: Supervision         General transfer comment: increased time, heavy UE support needed  Ambulation/Gait Ambulation/Gait assistance: Min guard Gait Distance (Feet): 80 Feet Assistive device: Rolling walker (2 wheeled) Gait Pattern/deviations: Step-through pattern;Decreased stride length;Trunk flexed     General Gait Details: cues for posture, proximity to walker, reinforced technique/safety with turns with walker  Stairs            Wheelchair Mobility    Modified Rankin (Stroke Patients Only)       Balance Overall balance assessment: Needs assistance   Sitting balance-Leahy Scale: Good       Standing balance-Leahy Scale: Fair Standing balance comment: static standing without walker okay, but better gait with device                             Pertinent Vitals/Pain Pain Assessment: Faces Faces Pain Scale: Hurts little more Pain Location: lower back, R shoulder/neck and lower legs Pain Descriptors / Indicators: Aching;Sore Pain Intervention(s): Monitored during session;Repositioned;Other (comment)(postural education)    Home Living Family/patient expects to be discharged to:: Private residence Living Arrangements: Spouse/significant other Available Help at Discharge: Family;Available PRN/intermittently Type of Home: Apartment Home Access: Level entry     Home Layout: One level Home Equipment: Walker - 2 wheels;Cane - single point      Prior Function Level of Independence: Independent with assistive device(s)         Comments: using cane prior to coming to hospital     Hand Dominance   Dominant Hand: Right    Extremity/Trunk Assessment   Upper Extremity Assessment Upper Extremity Assessment: RUE deficits/detail RUE Deficits / Details: points to pain over acromion and shoulder flexion to about 95, strength at least 3+/5 for shoulder elevation    Lower Extremity Assessment Lower Extremity Assessment: RLE deficits/detail;LLE deficits/detail RLE Deficits / Details: AROM WFL despite edema throughout, strength hip flexion at least 3/5, knee extension 4/5, ankle DF 4/5 LLE Deficits / Details: AROM [facility]  despite edema throughout, strength hip flexion  3+/5, knee extension 4/5, ankle DF 4/5    Cervical / Trunk Assessment Cervical / Trunk Assessment: Kyphotic;Other exceptions Cervical / Trunk Exceptions: rounded shoulders, forward head   Communication   Communication: No  difficulties  Cognition Arousal/Alertness: Awake/alert Behavior During Therapy: WFL for tasks assessed/performed Overall Cognitive Status: Within Functional Limits for tasks assessed                                        General Comments General comments (skin integrity, edema, etc.): spouse in the room and supportive; reports already seems to be getting HHPT set up, states did not have it after THA in September    Exercises Other Exercises Other Exercises: Encouraged backward shoulder rolls, scapular squeezes and chin tucks   Assessment/Plan    PT Assessment Patient needs continued PT services  PT Problem List Decreased strength;Decreased mobility;Decreased activity tolerance;Decreased balance;Decreased knowledge of use of DME       PT Treatment Interventions DME instruction;Therapeutic activities;Gait training;Therapeutic exercise;Patient/family education;Balance training;Functional mobility training    PT Goals (Current goals can be found in the Care Plan section)  Acute Rehab PT Goals Patient Stated Goal: to go home, get around easier PT Goal Formulation: With patient/family Time For Goal Achievement: 04/24/18    Frequency Min 3X/week   Barriers to discharge        Co-evaluation               AM-PAC PT "6 Clicks" Mobility  Outcome Measure Help needed turning from your back to your side while in a flat bed without using bedrails?: A Little Help needed moving from lying on your back to sitting on the side of a flat bed without using bedrails?: A Little Help needed moving to and from a bed to a chair (including a wheelchair)?: A Little Help needed standing up from a chair using your arms (e.g., wheelchair or bedside chair)?: A Little Help needed to walk in hospital room?: A Little Help needed climbing 3-5 steps with a railing? : A Little 6 Click Score: 18    End of Session Equipment Utilized During Treatment: Gait belt Activity Tolerance:  Patient tolerated treatment well Patient left: with call bell/phone within reach;in chair;with family/visitor present   PT Visit Diagnosis: Other abnormalities of gait and mobility (R26.89);Muscle weakness (generalized) (M62.81)    Time: 6712-4580 PT Time Calculation (min) (ACUTE ONLY): 25 min   Charges:   PT Evaluation $PT Eval Low Complexity: 1 Low PT Treatments $Gait Training: 8-22 mins        Magda Kiel, [place] [facility] [phone] 04/17/2018   Reginia Naas 04/17/2018, 5:21 PM

## 2018-04-17 NOTE — Plan of Care (Signed)
Patient continues to diurese, output difficult to quantify as patient starts to dribble on the way to the [facility] and refuses purewick.  Medicated for chronic pain in back and legs x 2 this shift.  Reports her dyspnea is much improved from on admission.  Maintains oxygen saturation in the 90's on room air.  Husband at bedside.

## 2018-04-17 NOTE — Progress Notes (Signed)
Progress Note  Patient Name: Stephanie Franco Date of Encounter: 04/17/2018  Primary Cardiologist: Glenetta Hew, MD   Subjective   Breathing a little bit better but not at baseline.  Lower extremity edema still quite extensive.  Legs wrapped  Inpatient Medications    Scheduled Meds: . allopurinol  300 mg Oral BID  . apixaban  5 mg Oral BID  . atorvastatin  40 mg Oral Q1400  . colchicine  0.6 mg Oral QPM  . ezetimibe  10 mg Oral Q1400  . furosemide  80 mg Intravenous BID  . hydrocerin   Topical BID  . insulin aspart  0-20 Units Subcutaneous TID WC  . insulin glargine  14 Units Subcutaneous Q supper  . ipratropium  0.5 mg Nebulization BID  . isosorbide mononitrate  15 mg Oral Daily  . levalbuterol  1.25 mg Nebulization BID  . nebivolol  10 mg Oral Daily  . pantoprazole  40 mg Oral Daily  . potassium chloride  20 mEq Oral BID  . senna-docusate  1 tablet Oral BID  . traZODone  50 mg Oral QHS   Continuous Infusions:  PRN Meds: acetaminophen **OR** acetaminophen, albuterol, HYDROcodone-acetaminophen, LORazepam, ondansetron **OR** ondansetron (ZOFRAN) IV   Vital Signs    Vitals:   04/16/18 1320 04/16/18 2037 04/16/18 2144 04/17/18 0446  BP: 117/69 109/72  114/73  Pulse: 77 69  81  Resp: 18 18  18   Temp: 99 F (37.2 C) 99.3 F (37.4 C)  97.7 F (36.5 C)  TempSrc: Oral Oral  Oral  SpO2: 100% 96% 95% 98%  Weight:      Height:        Intake/Output Summary (Last 24 hours) at 04/17/2018 6384 Last data filed at 04/17/2018 0021 Gross per 24 hour  Intake 120 ml  Output 575 ml  Net -455 ml   Filed Weights   04/15/18 0005 04/15/18 0524  Weight: 108.9 kg 107 kg    Telemetry    AFIB rate controlled- Personally Reviewed  ECG    Atrial fibrillation- Personally Reviewed  Physical Exam   GEN: No acute distress.  Obese Neck: No JVD Cardiac:  Regular irregular, no murmurs, rubs, or gallops.  Respiratory:  Crackles at bases bilaterally. GI: Soft, nontender,  non-distended  MS:  4+ Ace wrap lower extremity edema; No deformity. Neuro:  Nonfocal  Psych: Normal affect   Labs    Chemistry Recent Labs  Lab 04/15/18 0050 04/16/18 0428 04/17/18 0414  NA 144 146* 143  K 3.7 3.5 3.5  CL 110 107 107  CO2 25 27 29   GLUCOSE 107* 62* 117*  BUN 35* 28* 26*  CREATININE 1.76* 1.25* 1.23*  CALCIUM 8.9 9.1 9.0  GFRNONAA 29* 44* 45*  GFRAA 34* 51* 52*  ANIONGAP 9 12 7      Hematology Recent Labs  Lab 04/15/18 0050 04/16/18 0428 04/17/18 0414  WBC 13.5* 7.9 6.3  RBC 3.33* 3.79* 3.36*  3.36*  HGB 8.0* 8.6* 7.9*  HCT 28.2* 31.7* 28.7*  MCV 84.7 83.6 85.4  MCH 24.0* 22.7* 23.5*  MCHC 28.4* 27.1* 27.5*  RDW 19.3* 19.4* 19.4*  PLT 157 167 160    Cardiac Enzymes Recent Labs  Lab 04/15/18 0050 04/15/18 0720 04/15/18 1319 04/15/18 1838  TROPONINI 0.05* 0.04* 0.05* 0.04*   No results for input(s): TROPIPOC in the last 168 hours.   BNP Recent Labs  Lab 04/15/18 0050  BNP 358.7*     DDimer No results for input(s): DDIMER in the  last 168 hours.   Radiology    No results found.  Cardiac Studies   EF 40 to 45%, PA pressure 66, moderate mitral regurgitation  Patient Profile     67 y.o. female here with worsening lower extremity edema, shortness of breath consistent with acute systolic and diastolic heart failure with permanent atrial fibrillation on Eliquis, prior CAD stable.  Assessment & Plan    Acute on chronic diastolic/systolic heart failure exacerbation - I will go ahead and increase the Lasix to 80 mg IV twice a day - EF approximately 45%, similar to echo in September 2016.  Moderate pulmonary hypertension previously noted as well.  Mildly elevated troponin - Demand ischemia in the setting of heart failure exacerbation.  Low level, not acute coronary syndrome.  0.04.  Stable, no chest pain  Coronary artery disease -PCI in 2006, cardiac catheterization in 2013 showed patent stent, subsequent Myoview was negative in  2016.    Not on aspirin or Plavix because of Eliquis.  Stable no angina currently  Angina -Stable on Imdur, no changes  Chronic edema both legs - Continue with Lasix.  Increased to 80 mg IV twice daily on 04/17/2018  Hyperlipidemia - Target less than 70 LDL.  Controlled on atorvastatin and Zetia as outpatient.  No changes made  Morbid obesity -  BMI currently 41.7.  Increases her overall cardiovascular risk.  Contributing to pulmonary hypertension.  Continue to encourage weight loss.  Obstructive sleep apnea -On CPAP.    Encouraged use.  She did comply overnight.  Contributed to pulmonary hypertension.  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  Continue with blood pressure control, diuresis, CPAP.  Weight loss, no changes.  Atrial fibrillation, permanent -Chads vas score is 6 -Eliquis.  No bleeding issues.  Well rate controlled.  Acute kidney injury - Creatinine over the last year has ranged between 1.1 and 1.36.  On admission she was 1.76.  Currently 1.23.  Anemia -Back in September 2019 hemoglobin 8.2, currently 8.0>7.9.  Back in late August her hemoglobin was 11.5. - Evaluation per primary team.  Sounds like she has been worked up for this in [place] previously.  Diabetes with hypertension -Hemoglobin A1c 6.1 in late August.  Well controlled. No changes         For questions or updates, please contact [place] Please consult [email or web address] for contact info under        Signed, Candee Furbish, MD  04/17/2018, 8:21 AM

## 2018-04-17 NOTE — Progress Notes (Signed)
Called to room because patient states that she now wants to try and wear the CPAP device.  Placed on pt. As requested.

## 2018-04-17 NOTE — Progress Notes (Signed)
PROGRESS NOTE  Stephanie Franco TZG:[id number] DOB: 04-07-1951 DOA: 04/14/2018 PCP: Lin Landsman, MD  HPI/Recap of past 24 hours:  Sitting up in chair, reports feeling better,   denies chest pain, no fever, no cough  Lower extremity remain edematous, reports leg cramping Stephanie Franco at bedside  Assessment/Plan: Principal Problem:   Acute on chronic systolic heart failure (HCC) Active Problems:   Essential hypertension   Hyperlipidemia with target LDL less than 70   Atrial fibrillation, permanent (HCC); CHA2DS2Vasc - 6   Pulmonary HTN (HCC)   GERD (gastroesophageal reflux disease)   Gout   Hypomagnesemia   Elevated troponin   AKI (acute kidney injury) ([place])   Acute on chronic systolic (congestive) heart failure (HCC)  Acute on chronic combined chf: -improving, continue iv lasix, apply ace wrap to bilateral legs, elevate legs -continue bystolic, hold lisinopril, decrease imdur -cardiology consulted, input appreciated  Hypokalemia/hypomagnesemia: Replace k and mag   Chronic afib -rate controlled on bystolic, continue eliquis   AKI on CKDIII -ua no bacteria, -aki likely due to cardiorenal -improving, renal dosing meds (hold lisinopril for now)  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    Code Status: full  Family Communication: patient and Stephanie Franco  Disposition Plan: home with home health once medically stable with cardiology clearance   Consultants:  Cardiology    Procedures:  none  Antibiotics:  once   Objective: BP 115/68 (BP Location: Right Arm)   Pulse 91   Temp 98.5 F (36.9 C) (Oral)   Resp 20   Ht 5\' 3"  (1.6 m)   Wt 107 kg   SpO2 98%   BMI 41.79 kg/m   Intake/Output Summary (Last 24 hours) at 04/17/2018 1411 Last data filed at 04/17/2018 0930 Gross per 24 hour  Intake 360 ml  Output 275 ml  Net 85 ml   Filed Weights   04/15/18 0005 04/15/18 0524  Weight: 108.9 kg 107 kg    Exam: Patient is examined daily including today on 04/17/2018, exams remain the same as of yesterday except that has changed    General:  Weak, NAD  Cardiovascular: RRR  Respiratory: diminished at basis, no wheezing  Abdomen: Soft/ND/NT, positive BS  Musculoskeletal: bilateral lower extremity 3+ pitting Edema, now with ace wrap  Neuro: alert, oriented   Data Reviewed: Basic Metabolic Panel: Recent Labs  Lab 04/15/18 0050 04/16/18 0428 04/17/18 0414  NA 144 146* 143  K 3.7 3.5 3.5  CL 110 107 107  CO2 25 27 29   GLUCOSE 107* 62* 117*  BUN 35* 28* 26*  CREATININE 1.76* 1.25* 1.23*  CALCIUM 8.9 9.1 9.0  MG 1.6* 1.7 2.1   Liver Function Tests: No results for input(s): AST, ALT, ALKPHOS, BILITOT, PROT, ALBUMIN in the last 168 hours. No results for input(s): LIPASE, AMYLASE in the last 168 hours. No results for input(s): AMMONIA in the last 168 hours. CBC: Recent Labs  Lab 04/15/18 0050 04/16/18 0428 04/17/18 0414  WBC 13.5* 7.9 6.3  NEUTROABS  --  5.2 3.6  HGB 8.0* 8.6* 7.9*  HCT 28.2* 31.7* 28.7*  MCV 84.7 83.6 85.4  PLT 157 167 160   Cardiac Enzymes:   Recent Labs  Lab 04/15/18 0050 04/15/18 0720 04/15/18 1319 04/15/18 1838  TROPONINI 0.05* 0.04* 0.05* 0.04*   BNP (last 3 results) Recent Labs    04/15/18 0050  BNP 358.7*    ProBNP (last 3 results) No results for input(s): PROBNP in the last 8760 hours.  CBG: Recent Labs  Lab 04/16/18 1135 04/16/18 1616 04/16/18 2115 04/17/18 0724 04/17/18 1148   GLUCAP 145* 90 125* 90 128*    No results found for this or any previous visit (from the past 240 hour(s)).   Studies: No results found.  Scheduled Meds: . allopurinol  300 mg Oral BID  . apixaban  5 mg Oral BID  . atorvastatin  40 mg Oral Q1400  . colchicine  0.6 mg Oral QPM  . ezetimibe  10 mg Oral Q1400  . furosemide  80 mg Intravenous BID  . hydrocerin   Topical BID  . insulin aspart  0-20 Units Subcutaneous TID WC  . insulin glargine  14 Units Subcutaneous Q supper  . ipratropium  0.5 mg Nebulization BID  . isosorbide mononitrate  15 mg Oral Daily  . levalbuterol  1.25 mg Nebulization BID  . nebivolol  10 mg Oral Daily  . pantoprazole  40 mg Oral Daily  . potassium chloride  20 mEq Oral BID  . potassium chloride  40 mEq Oral Once  . senna-docusate  1 tablet Oral BID  . traZODone  50 mg Oral QHS    Continuous Infusions:   Time spent: 37mins I have personally reviewed and interpreted on  04/17/2018 daily labs, tele strips, imagings as discussed above under date review session and assessment and plans.  I reviewed all nursing notes, pharmacy notes, consultant notes,  vitals, pertinent old records  I have discussed plan of care as described above with RN , patient and family on 04/17/2018   Florencia Reasons MD, PhD  [facility] Pager [phone]. If 7PM-7AM, please contact night-coverage at [email or web address], password [facility] 04/17/2018, 2:11 PM  LOS: 1 day

## 2018-04-18 LAB — HEPATIC FUNCTION PANEL
ALT: 19 U/L (ref 0–44)
AST: 24 U/L (ref 15–41)
Albumin: 2.9 g/dL — ABNORMAL LOW (ref 3.5–5.0)
Alkaline Phosphatase: 74 U/L (ref 38–126)
Bilirubin, Direct: 0.4 mg/dL — ABNORMAL HIGH (ref 0.0–0.2)
Indirect Bilirubin: 0.8 mg/dL (ref 0.3–0.9)
Total Bilirubin: 1.2 mg/dL (ref 0.3–1.2)
Total Protein: 6.2 g/dL — ABNORMAL LOW (ref 6.5–8.1)

## 2018-04-18 LAB — BASIC METABOLIC PANEL
Anion gap: 10 (ref 5–15)
BUN: 23 mg/dL (ref 8–23)
CO2: 27 mmol/L (ref 22–32)
Calcium: 9 mg/dL (ref 8.9–10.3)
Chloride: 108 mmol/L (ref 98–111)
Creatinine, Ser: 1.17 mg/dL — ABNORMAL HIGH (ref 0.44–1.00)
GFR calc Af Amer: 55 mL/min — ABNORMAL LOW (ref >60)
GFR, EST NON AFRICAN AMERICAN: 48 mL/min — AB (ref >60)
Glucose, Bld: 89 mg/dL (ref 70–99)
Potassium: 3.4 mmol/L — ABNORMAL LOW (ref 3.5–5.1)
Sodium: 145 mmol/L (ref 135–145)

## 2018-04-18 LAB — GLUCOSE, CAPILLARY
Glucose-Capillary: 87 mg/dL (ref 70–99)
Glucose-Capillary: 137 mg/dL — ABNORMAL HIGH (ref 70–99)
Glucose-Capillary: 101 mg/dL — ABNORMAL HIGH (ref 70–99)
Glucose-Capillary: 128 mg/dL — ABNORMAL HIGH (ref 70–99)

## 2018-04-18 LAB — MAGNESIUM: MAGNESIUM: 1.8 mg/dL (ref 1.7–2.4)

## 2018-04-18 MED ORDER — DIPHENHYDRAMINE HCL 50 MG/ML IJ SOLN
12.5000 mg | Freq: Every evening | INTRAMUSCULAR | Status: DC | PRN
  Administered 2018-04-18: 12.5 mg via INTRAVENOUS
  Filled 2018-04-18: qty 1

## 2018-04-18 MED ORDER — MAGNESIUM SULFATE IN D5W 1-5 GM/100ML-% IV SOLN
1.0000 g | Freq: Once | INTRAVENOUS | Status: AC
  Administered 2018-04-18: 1 g via INTRAVENOUS
  Filled 2018-04-18: qty 100

## 2018-04-18 MED ORDER — POTASSIUM CHLORIDE CRYS ER 20 MEQ PO TBCR
40.0000 meq | EXTENDED_RELEASE_TABLET | Freq: Once | ORAL | Status: AC
  Administered 2018-04-18: 40 meq via ORAL
  Filled 2018-04-18: qty 2

## 2018-04-18 NOTE — Progress Notes (Signed)
Pt states she will place her CPAP on herself when she was ready for bed.  Pt instructed to have RN contact [facility] should she need any help with her CPAP.

## 2018-04-18 NOTE — Progress Notes (Signed)
PROGRESS NOTE  Stephanie Franco VEH:[id number] DOB: Sep 15, 1950 DOA: 04/14/2018 PCP: Lin Landsman, MD  HPI/Recap of past 24 hours:  Lasix dose increased by cardiology yesterday, she reports she pees a lot, urine outpatient is not accurately documented, Weight is down She is Sitting up in chair,  denies chest pain, no fever, no cough  Lower extremity remain edematous, reports legs are itching and burning at night She tolerate her home cpap better  Husband at bedside  Assessment/Plan: Principal Problem:   Acute on chronic systolic heart failure (HCC) Active Problems:   Essential hypertension   Hyperlipidemia with target LDL less than 70   Atrial fibrillation, permanent (HCC); CHA2DS2Vasc - 6   Pulmonary HTN (HCC)   GERD (gastroesophageal reflux disease)   Gout   Hypomagnesemia   Elevated troponin   AKI (acute kidney injury) ([place])   Acute on chronic systolic (congestive) heart failure (HCC)  Acute on chronic combined chf: -improving, continue iv lasix, apply ace wrap to bilateral legs, elevate legs -continue bystolic, hold lisinopril,  imdur dose decreased due to low normal bp -cardiology consulted, input appreciated, will follow recommendations  Hypokalemia/hypomagnesemia: Remain low, continue to Replace k and mag   Chronic afib -rate controlled on bystolic, continue eliquis   AKI on CKDIII -ua no bacteria, -aki likely due to cardiorenal -improving, renal dosing meds (hold lisinopril for now)  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    Code Status: full  Family Communication: patient and husband  Disposition Plan: home with home health once medically stable with cardiology clearance   Consultants:  Cardiology   Procedures:  none  Antibiotics:  once   Objective: BP 119/64 (BP Location: Right Arm)   Pulse 75   Temp 98.9 F (37.2 C) (Oral)   Resp 18   Ht 5\' 3"  (1.6 m)   Wt 105.4 kg   SpO2 99%   BMI 41.16 kg/m   Intake/Output Summary (Last 24 hours) at 04/18/2018 1157 Last data filed at 04/18/2018 0900 Gross per 24 hour  Intake 120 ml  Output 250 ml  Net -130 ml   Filed Weights   04/15/18 0005 04/15/18 0524 04/18/18 0643  Weight: 108.9 kg 107 kg 105.4 kg    Exam: Patient is examined daily including today on 04/18/2018, exams remain the same as of yesterday except that has changed    General:  Weak, NAD  Cardiovascular: RRR  Respiratory: diminished at basis, no wheezing  Abdomen: Soft/ND/NT, positive BS  Musculoskeletal: bilateral lower extremity 3+ pitting Edema,   Neuro: alert, oriented   Data Reviewed: Basic Metabolic Panel: Recent Labs  Lab 04/15/18 0050 04/16/18 0428 04/17/18 0414 04/18/18 0434  NA 144 146* 143 145  K 3.7 3.5 3.5 3.4*  CL 110 107 107 108  CO2 25 27 29 27   GLUCOSE 107* 62* 117* 89  BUN 35* 28* 26* 23  CREATININE 1.76* 1.25* 1.23* 1.17*  CALCIUM 8.9 9.1 9.0 9.0  MG 1.6* 1.7 2.1 1.8   Liver Function Tests: Recent Labs  Lab 04/18/18 0434  AST 24  ALT 19  ALKPHOS 74  BILITOT 1.2  PROT 6.2*  ALBUMIN 2.9*   No results for input(s): LIPASE, AMYLASE in the last 168 hours. No results for input(s): AMMONIA in the last 168 hours. CBC: Recent Labs  Lab 04/15/18 0050 04/16/18 0428 04/17/18 0414  WBC 13.5* 7.9 6.3  NEUTROABS  --  5.2 3.6  HGB 8.0* 8.6* 7.9*  HCT 28.2* 31.7* 28.7*  MCV 84.7 83.6 85.4  PLT 157 167 160   Cardiac Enzymes:   Recent Labs  Lab  04/15/18 0050 04/15/18 0720 04/15/18 1319 04/15/18 1838  TROPONINI 0.05* 0.04* 0.05* 0.04*   BNP (last 3 results) Recent Labs    04/15/18 0050  BNP 358.7*    ProBNP (last 3 results) No results for input(s): PROBNP in the last 8760 hours.  CBG: Recent Labs  Lab 04/17/18 0724 04/17/18 1148 04/17/18 1613 04/17/18 2017 04/18/18 0805  GLUCAP 90 128* 94 119* 87    No results found for this or any previous visit (from the past 240 hour(s)).   Studies: No results found.  Scheduled Meds: . allopurinol  300 mg Oral BID  . apixaban  5 mg Oral BID  . atorvastatin  40 mg Oral Q1400  . colchicine  0.6 mg Oral QPM  . ezetimibe  10 mg Oral Q1400  . furosemide  80 mg Intravenous BID  . hydrocerin   Topical BID  . insulin aspart  0-20 Units Subcutaneous TID WC  . insulin glargine  14 Units Subcutaneous Q supper  . isosorbide mononitrate  15 mg Oral Daily  . nebivolol  10 mg Oral Daily  . pantoprazole  40 mg Oral Daily  . potassium chloride  20 mEq Oral BID  . senna-docusate  1 tablet Oral BID  . traZODone  50 mg Oral QHS    Continuous Infusions:   Time spent: 13mins I have personally reviewed and interpreted on  04/18/2018 daily labs, tele strips, imagings as discussed above under date review session and assessment and plans.  I reviewed all nursing notes, pharmacy notes, consultant notes,  vitals, pertinent old records  I have discussed plan of care as described above with RN , patient and family on 04/18/2018   Florencia Reasons MD, PhD  [facility] Pager [phone]. If 7PM-7AM, please contact night-coverage at [email or web address], password [facility] 04/18/2018, 11:57 AM  LOS: 2 days

## 2018-04-18 NOTE — Progress Notes (Signed)
Progress Note  Patient Name: Stephanie Franco Date of Encounter: 04/18/2018  Primary Cardiologist: Glenetta Hew, MD   Subjective     Inpatient Medications    Scheduled Meds: . allopurinol  300 mg Oral BID  . apixaban  5 mg Oral BID  . atorvastatin  40 mg Oral Q1400  . colchicine  0.6 mg Oral QPM  . ezetimibe  10 mg Oral Q1400  . furosemide  80 mg Intravenous BID  . hydrocerin   Topical BID  . insulin aspart  0-20 Units Subcutaneous TID WC  . insulin glargine  14 Units Subcutaneous Q supper  . isosorbide mononitrate  15 mg Oral Daily  . nebivolol  10 mg Oral Daily  . pantoprazole  40 mg Oral Daily  . potassium chloride  20 mEq Oral BID  . senna-docusate  1 tablet Oral BID  . traZODone  50 mg Oral QHS   Continuous Infusions:  PRN Meds: acetaminophen **OR** acetaminophen, diphenhydrAMINE, HYDROcodone-acetaminophen, ipratropium, levalbuterol, LORazepam, ondansetron **OR** ondansetron (ZOFRAN) IV   Vital Signs    Vitals:   04/17/18 2015 04/18/18 0437 04/18/18 0643 04/18/18 0932  BP: 125/86 119/64    Pulse: 88 75    Resp: 18 18    Temp: 99.9 F (37.7 C) 98.9 F (37.2 C)    TempSrc: Oral Oral    SpO2: 98% 97%  99%  Weight:   105.4 kg   Height:        Intake/Output Summary (Last 24 hours) at 04/18/2018 1401 Last data filed at 04/18/2018 1231 Gross per 24 hour  Intake 120 ml  Output 950 ml  Net -830 ml    Net negatvie 2.4 L   Filed Weights   04/15/18 0005 04/15/18 0524 04/18/18 0643  Weight: 108.9 kg 107 kg 105.4 kg    Telemetry    Afib  80s  Personally Reviewed  ECG     Physical Exam   GEN: No acute distress.  Obese Neck: Neck full  JVP is difficult  Cardiac:  Regular irregular, no murmurs, rubs, or gallops.  Respiratory:  Crackles at bases bilaterally. GI: Soft, nontender, non-distended  MS: Ace wrap lower extremity  1+ edema; No deformity. Neuro:  Nonfocal  Psych: Normal affect   Labs    Chemistry Recent Labs  Lab 04/16/18 0428  04/17/18 0414 04/18/18 0434  NA 146* 143 145  K 3.5 3.5 3.4*  CL 107 107 108  CO2 27 29 27   GLUCOSE 62* 117* 89  BUN 28* 26* 23  CREATININE 1.25* 1.23* 1.17*  CALCIUM 9.1 9.0 9.0  PROT  --   --  6.2*  ALBUMIN  --   --  2.9*  AST  --   --  24  ALT  --   --  19  ALKPHOS  --   --  74  BILITOT  --   --  1.2  GFRNONAA 44* 45* 48*  GFRAA 51* 52* 55*  ANIONGAP 12 7 10      Hematology Recent Labs  Lab 04/15/18 0050 04/16/18 0428 04/17/18 0414  WBC 13.5* 7.9 6.3  RBC 3.33* 3.79* 3.36*  3.36*  HGB 8.0* 8.6* 7.9*  HCT 28.2* 31.7* 28.7*  MCV 84.7 83.6 85.4  MCH 24.0* 22.7* 23.5*  MCHC 28.4* 27.1* 27.5*  RDW 19.3* 19.4* 19.4*  PLT 157 167 160    Cardiac Enzymes Recent Labs  Lab 04/15/18 0050 04/15/18 0720 04/15/18 1319 04/15/18 1838  TROPONINI 0.05* 0.04* 0.05* 0.04*   No  results for input(s): TROPIPOC in the last 168 hours.   BNP Recent Labs  Lab 04/15/18 0050  BNP 358.7*     DDimer No results for input(s): DDIMER in the last 168 hours.   Radiology    No results found.  Cardiac Studies   EF 40 to 45%, PA pressure 66, moderate mitral regurgitation  Patient Profile     67 y.o. female here with worsening lower extremity edema, shortness of breath consistent with acute systolic and diastolic heart failure with permanent atrial fibrillation on Eliquis, prior CAD stable.  Assessment & Plan    Acute on chronic diastolic/systolic heart failure exacerbation - EF approximately 45%, similar to echo in September 2016.  Moderate pulmonary hypertension previously noted as well. Still with some volume increase on exam   Responding   Continue IV today Pt admits to not following low salt diet like sy should   Mildly elevated troponin - Demand ischemia in the setting of heart failure exacerbation.    Coronary artery disease -PCI in 2006, cardiac catheterization in 2013 showed patent stent, subsequent Myoview was negative in 2016.    Not on aspirin or Plavix  because of Eliquis.  Stable no angina currently   Hyperlipidemia - Target less than 70 LDL. Keep on atorvastatin and Zetia   Morbid obesity -  BMI currently 41.7.  Increases her overall cardiovascular risk.  Contributing to pulmonary hypertension.  Continue to encourage weight loss.  Obstructive sleep apnea -On CPAP  Uses   Tolerates well at home   Encourage wt loss  ion.Secondary pulmonary hypertension - From obstructive sleep apnea and obesity.  Continue with blood pressure control, diuresis, CPAP.  Weight loss, no changes.  Atrial fibrillation, permanent -Chads vas score is 6 -Eliquis.  No bleeding issues.  Well rate controlled.  Acute kidney injury - Creatinine over the last year has ranged between 1.1 and 1.36.  On admission she was 1.76.  Currently 1.23.  Anemia -Back in September 2019 hemoglobin 8.2, currently 8.0>7.9.  Back in late August her hemoglobin was 11.5. - Evaluation per primary team.  Sounds like she has been worked up for this in [place] previously.  Diabetes with hypertension -Hemoglobin A1c 6.1 in late August.  Well controlled. No changes         For questions or updates, please contact [place] Please consult [email or web address] for contact info under        Signed, Dorris Carnes, MD  04/18/2018, 2:01 PM

## 2018-04-19 ENCOUNTER — Ambulatory Visit (HOSPITAL_COMMUNITY): Payer: Medicare Other

## 2018-04-19 DIAGNOSIS — R609 Edema, unspecified: Secondary | ICD-10-CM

## 2018-04-19 LAB — BASIC METABOLIC PANEL
Anion gap: 12 (ref 5–15)
BUN: 23 mg/dL (ref 8–23)
CO2: 27 mmol/L (ref 22–32)
Calcium: 9.3 mg/dL (ref 8.9–10.3)
Chloride: 105 mmol/L (ref 98–111)
Creatinine, Ser: 1.39 mg/dL — ABNORMAL HIGH (ref 0.44–1.00)
GFR calc Af Amer: 45 mL/min — ABNORMAL LOW (ref >60)
GFR calc non Af Amer: 39 mL/min — ABNORMAL LOW (ref >60)
Glucose, Bld: 116 mg/dL — ABNORMAL HIGH (ref 70–99)
Potassium: 3.5 mmol/L (ref 3.5–5.1)
Sodium: 144 mmol/L (ref 135–145)

## 2018-04-19 LAB — GLUCOSE, CAPILLARY
GLUCOSE-CAPILLARY: 136 mg/dL — AB (ref 70–99)
Glucose-Capillary: 107 mg/dL — ABNORMAL HIGH (ref 70–99)
Glucose-Capillary: 106 mg/dL — ABNORMAL HIGH (ref 70–99)

## 2018-04-19 LAB — MAGNESIUM: Magnesium: 1.7 mg/dL (ref 1.7–2.4)

## 2018-04-19 MED ORDER — GABAPENTIN 100 MG PO CAPS
100.0000 mg | ORAL_CAPSULE | Freq: Every day | ORAL | Status: DC
  Administered 2018-04-19 – 2018-04-21 (×3): 100 mg via ORAL
  Filled 2018-04-19 (×3): qty 1

## 2018-04-19 NOTE — Progress Notes (Addendum)
Patient ambulated in hall approximately 300 ft on room air and O2 sats were 98-100%. Overall tolerated well. Patient remains on room air. Eulas Post, RN

## 2018-04-19 NOTE — Progress Notes (Signed)
Physical Therapy Treatment Patient Details Name: Stephanie Franco MRN: [id number] DOB: [birth year] Today's Date: 04/19/2018    History of Present Illness Ms. Redmon is a 67 year old female with history of permanent atrial fibrillation on Eliquis, morbid obesity, obstructive sleep apnea, secondary pulmonary hypertension, ejection fraction of approximately 40 to 45% on echocardiogram, prior coronary artery disease with PCI performed in 2006, Myoview in 2016-, chronic lower extremity edema, history of breast cancer with left mastectomy, diabetes with hypertension and hyperlipidemia here with worsening dyspnea on exertion with worsening lower extremity edema over the past several weeks.  Admitted for CHF exacerbation.    PT Comments    Patient able to ambulate increased distance, but still needing constant reminders for posture and walker use.  Needs continued postural strengthening and education and walker safety.  Remains appropriate for HHPT upon d/c.   Follow Up Recommendations  Home health PT;Supervision - Intermittent     Equipment Recommendations  None recommended by PT    Recommendations for Other Services       Precautions / Restrictions Precautions Precautions: Fall    Mobility  Bed Mobility               General bed mobility comments: up in chair  Transfers   Equipment used: Rolling walker (2 wheeled) Transfers: Sit to/from Stand Sit to Stand: Supervision         General transfer comment: increased time, heavy UE support needed  Ambulation/Gait Ambulation/Gait assistance: Min guard Gait Distance (Feet): 150 Feet Assistive device: Rolling walker (2 wheeled) Gait Pattern/deviations: Step-through pattern;Decreased stride length;Shuffle;Trunk flexed;Drifts right/left     General Gait Details: with walker consistently running into obstacles/wall on R side; assist to keep walker straight; cues throughout for posture, forward gaze,etc   Stairs              Wheelchair Mobility    Modified Rankin (Stroke Patients Only)       Balance Overall balance assessment: Needs assistance   Sitting balance-Leahy Scale: Good       Standing balance-Leahy Scale: Fair                              Cognition Arousal/Alertness: Awake/alert Behavior During Therapy: WFL for tasks assessed/performed Overall Cognitive Status: Within Functional Limits for tasks assessed                                        Exercises Other Exercises Other Exercises: seated scapular squeezes with chin tuck x 5 with blanket roll at spine Other Exercises: backward shoulder rolls x 5 Other Exercises: seated marching x 10    General Comments General comments (skin integrity, edema, etc.): Spouse in room      Pertinent Vitals/Pain Pain Score: 0-No pain    Home Living                      Prior Function            PT Goals (current goals can now be found in the care plan section) Progress towards PT goals: Progressing toward goals    Frequency    Min 3X/week      PT Plan Current plan remains appropriate    Co-evaluation              AM-PAC PT "6 Clicks" Mobility  Outcome Measure  Help needed turning from your back to your side while in a flat bed without using bedrails?: A Little Help needed moving from lying on your back to sitting on the side of a flat bed without using bedrails?: A Little Help needed moving to and from a bed to a chair (including a wheelchair)?: A Little Help needed standing up from a chair using your arms (e.g., wheelchair or bedside chair)?: A Little Help needed to walk in hospital room?: A Little Help needed climbing 3-5 steps with a railing? : A Little 6 Click Score: 18    End of Session Equipment Utilized During Treatment: Gait belt Activity Tolerance: Patient tolerated treatment well Patient left: with call bell/phone within reach;in chair;with family/visitor present    PT Visit Diagnosis: Other abnormalities of gait and mobility (R26.89);Muscle weakness (generalized) (M62.81)     Time: 8016-5537 PT Time Calculation (min) (ACUTE ONLY): 29 min  Charges:  $Gait Training: 8-22 mins $Therapeutic Exercise: 8-22 mins                     Magda Kiel, [place] [facility] [phone] 04/19/2018    Reginia Naas 04/19/2018, 3:30 PM

## 2018-04-19 NOTE — Progress Notes (Signed)
Bilateral lower extremity venous duplex has been completed. Negative for obvious evidence of DVT.  04/19/18 11:57 AM Stephanie Franco RVT

## 2018-04-19 NOTE — Progress Notes (Signed)
PROGRESS NOTE  Stephanie Franco:[id number] DOB: 02/08/1951 DOA: 04/14/2018 PCP: Lin Landsman, MD  HPI/Recap of past 24 hours:   Weight is down, legs remain edematous She is Sitting up in chair,  denies chest pain, no fever, no cough  Husband at bedside  Assessment/Plan: Principal Problem:   Acute on chronic systolic heart failure (HCC) Active Problems:   Essential hypertension   Hyperlipidemia with target LDL less than 70   Atrial fibrillation, permanent (HCC); CHA2DS2Vasc - 6   Pulmonary HTN (HCC)   GERD (gastroesophageal reflux disease)   Gout   Hypomagnesemia   Elevated troponin   AKI (acute kidney injury) (HCC)   Acute on chronic systolic (congestive) heart failure (HCC)  Acute on chronic combined chf/bilateral lower extremity pitting edema: -venous doppler negative for DVT -improving, continue iv lasix, apply ace wrap to bilateral legs, elevate legs -continue bystolic, hold lisinopril,  imdur dose decreased due to low normal bp -cardiology consulted, input appreciated, will follow recommendations  Hypokalemia/hypomagnesemia: Remain low, continue to Replace k and mag, repeat lab in am   Chronic afib -rate controlled on bystolic, continue eliquis   AKI on CKDIII -ua no bacteria, -aki likely due to cardiorenal -improving, renal dosing meds (hold lisinopril for now)  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/2017 Has hypoglycemia episode in the hospital Decrease lantus from 28units daily to 14units daily, continue ssi -she c/o bilateral legs burning worse at night, suspect from edema, but will try neurontin qhs, monitor effect  HTN:  Currently on bystolic/reduced dose of imdur and iv lasix  HLD: continue lipitor and zetia  Anemia of chronic disease: FOBT negative Iron/b12/folate unremarkable TSH mild elevated, free t4 unremarkable retic inappropriately low Refer to nephrology to discuss epo injection  Gout: continue allopurinol and colchicine  Morbid  obesity/OSA on cpap Body mass index is 40.26 kg/m.    Code Status: full  Family Communication: patient and husband  Disposition Plan: home with home health once medically stable with cardiology clearance   Consultants:  Cardiology   Procedures:  none  Antibiotics:  once   Objective: BP 123/77 (BP Location: Right Arm)   Pulse 90   Temp 99 F (37.2 C) (Oral)   Resp 16   Ht 5\' 3"  (1.6 m)   Wt 103.1 kg   SpO2 99%   BMI 40.26 kg/m   Intake/Output Summary (Last 24 hours) at 04/19/2018 1930 Last data filed at 04/19/2018 0930 Gross per 24 hour  Intake 600 ml  Output -  Net 600 ml   Filed Weights   04/15/18 0524 04/18/18 0643 04/19/18 0438  Weight: 107 kg 105.4 kg 103.1 kg    Exam: Patient is examined daily including today on 04/19/2018, exams remain the same as of yesterday except that has changed    General:  Weak, NAD  Cardiovascular: RRR  Respiratory: diminished at basis, no wheezing  Abdomen: Soft/ND/NT, positive BS  Musculoskeletal: bilateral lower extremity 2+ pitting Edema,   Neuro: alert, oriented   Data Reviewed: Basic Metabolic Panel: Recent Labs  Lab 04/15/18 0050 04/16/18 0428 04/17/18 0414 04/18/18 0434 04/19/18 0413  NA 144 146* 143 145 144  K 3.7 3.5 3.5 3.4* 3.5  CL 110 107 107 108 105  CO2 25 27 29 27 27   GLUCOSE 107* 62* 117* 89 116*  BUN 35* 28* 26* 23 23  CREATININE 1.76* 1.25* 1.23* 1.17* 1.39*  CALCIUM 8.9 9.1 9.0 9.0 9.3  MG 1.6* 1.7 2.1 1.8 1.7   Liver Function  Tests: Recent Labs  Lab 04/18/18 0434  AST 24  ALT 19  ALKPHOS 74  BILITOT 1.2  PROT 6.2*  ALBUMIN 2.9*   No results for input(s): LIPASE, AMYLASE in the last 168 hours. No results for input(s): AMMONIA in the last 168 hours. CBC: Recent Labs  Lab 04/15/18 0050 04/16/18 0428 04/17/18 0414  WBC 13.5* 7.9 6.3  NEUTROABS  --  5.2 3.6  HGB 8.0* 8.6* 7.9*  HCT 28.2* 31.7* 28.7*  MCV 84.7 83.6 85.4  PLT 157 167 160   Cardiac Enzymes:   Recent  Labs  Lab 04/15/18 0050 04/15/18 0720 04/15/18 1319 04/15/18 1838  TROPONINI 0.05* 0.04* 0.05* 0.04*   BNP (last 3 results) Recent Labs    04/15/18 0050  BNP 358.7*    ProBNP (last 3 results) No results for input(s): PROBNP in the last 8760 hours.  CBG: Recent Labs  Lab 04/18/18 1220 04/18/18 1649 04/18/18 2025 04/19/18 0736 04/19/18 1238  GLUCAP 137* 101* 128* 136* 107*    No results found for this or any previous visit (from the past 240 hour(s)).   Studies: Vas [place] Lower Extremity Venous (dvt)  Result Date: 04/19/2018  Lower Venous Study Indications: Edema.  Limitations: Body habitus and poor ultrasound/tissue interface. Performing Technologist: Oliver Hum RVT  Examination Guidelines: A complete evaluation includes B-mode imaging, spectral Doppler, color Doppler, and power Doppler as needed of all accessible portions of each vessel. Bilateral testing is considered an integral part of a complete examination. Limited examinations for reoccurring indications may be performed as noted.  Right Venous Findings: +---------+---------------+---------+-----------+----------+-------+          CompressibilityPhasicitySpontaneityPropertiesSummary +---------+---------------+---------+-----------+----------+-------+ CFV      Full           Yes      Yes                          +---------+---------------+---------+-----------+----------+-------+ SFJ      Full                                                 +---------+---------------+---------+-----------+----------+-------+ FV Prox  Full                                                 +---------+---------------+---------+-----------+----------+-------+ FV Mid   Full                                                 +---------+---------------+---------+-----------+----------+-------+ FV Distal               Yes      Yes                           +---------+---------------+---------+-----------+----------+-------+ PFV      Full                                                 +---------+---------------+---------+-----------+----------+-------+  POP      Full           Yes      Yes                          +---------+---------------+---------+-----------+----------+-------+ PTV      Full                                                 +---------+---------------+---------+-----------+----------+-------+ PERO     Full                                                 +---------+---------------+---------+-----------+----------+-------+  Left Venous Findings: +---------+---------------+---------+-----------+----------+-------+          CompressibilityPhasicitySpontaneityPropertiesSummary +---------+---------------+---------+-----------+----------+-------+ CFV      Full           Yes      Yes                          +---------+---------------+---------+-----------+----------+-------+ SFJ      Full                                                 +---------+---------------+---------+-----------+----------+-------+ FV Prox  Full                                                 +---------+---------------+---------+-----------+----------+-------+ FV Mid   Full                                                 +---------+---------------+---------+-----------+----------+-------+ FV Distal               Yes      Yes                          +---------+---------------+---------+-----------+----------+-------+ PFV      Full                                                 +---------+---------------+---------+-----------+----------+-------+ POP      Full           Yes      Yes                          +---------+---------------+---------+-----------+----------+-------+ PTV      Full                                                  +---------+---------------+---------+-----------+----------+-------+  PERO     Full                                                 +---------+---------------+---------+-----------+----------+-------+    Summary: Right: There is no evidence of deep vein thrombosis in the lower extremity. However, portions of this examination were limited- see technologist comments above. No cystic structure found in the popliteal fossa. Left: There is no evidence of deep vein thrombosis in the lower extremity. However, portions of this examination were limited- see technologist comments above. No cystic structure found in the popliteal fossa.  *See table(s) above for measurements and observations. Electronically signed by Servando Snare MD on 04/19/2018 at 3:29:13 PM.    Final     Scheduled Meds: . allopurinol  300 mg Oral BID  . apixaban  5 mg Oral BID  . atorvastatin  40 mg Oral Q1400  . colchicine  0.6 mg Oral QPM  . ezetimibe  10 mg Oral Q1400  . hydrocerin   Topical BID  . insulin aspart  0-20 Units Subcutaneous TID WC  . insulin glargine  14 Units Subcutaneous Q supper  . isosorbide mononitrate  15 mg Oral Daily  . nebivolol  10 mg Oral Daily  . pantoprazole  40 mg Oral Daily  . potassium chloride  20 mEq Oral BID  . senna-docusate  1 tablet Oral BID  . traZODone  50 mg Oral QHS    Continuous Infusions:   Time spent: 37mins I have personally reviewed and interpreted on  04/19/2018 daily labs, tele strips, imagings as discussed above under date review session and assessment and plans.  I reviewed all nursing notes, pharmacy notes, consultant notes,  vitals, pertinent old records  I have discussed plan of care as described above with RN , patient and family on 04/19/2018   Florencia Reasons MD, PhD  [facility] Pager [phone]. If 7PM-7AM, please contact night-coverage at [email or web address], password [facility] 04/19/2018, 7:30 PM  LOS: 3 days

## 2018-04-19 NOTE — Progress Notes (Addendum)
Progress Note  Patient Name: Stephanie Franco Date of Encounter: 04/19/2018  Primary Cardiologist: Glenetta Hew, MD   Subjective   Patient still had some shortness of breath with being up to BR this am. Still has tight edema of lower legs and her biggest complaint is pain in bilateral lower legs that has been present for about the last 2 months.   She discussed in depth having problems over the last several months starting with a hip replacement in Sept and her son dying in October, then she had the flu and sepsis. Her diuretic therapy was not very consistent over those times. In November she was back to taking her lasix 80 mg daily with an extra 80 mg in afternoon as needed.   Inpatient Medications    Scheduled Meds: . allopurinol  300 mg Oral BID  . apixaban  5 mg Oral BID  . atorvastatin  40 mg Oral Q1400  . colchicine  0.6 mg Oral QPM  . ezetimibe  10 mg Oral Q1400  . furosemide  80 mg Intravenous BID  . hydrocerin   Topical BID  . insulin aspart  0-20 Units Subcutaneous TID WC  . insulin glargine  14 Units Subcutaneous Q supper  . isosorbide mononitrate  15 mg Oral Daily  . nebivolol  10 mg Oral Daily  . pantoprazole  40 mg Oral Daily  . potassium chloride  20 mEq Oral BID  . senna-docusate  1 tablet Oral BID  . traZODone  50 mg Oral QHS   Continuous Infusions:  PRN Meds: acetaminophen **OR** acetaminophen, diphenhydrAMINE, HYDROcodone-acetaminophen, ipratropium, levalbuterol, LORazepam, ondansetron **OR** ondansetron (ZOFRAN) IV   Vital Signs    Vitals:   04/18/18 2027 04/19/18 0438 04/19/18 0700 04/19/18 1030  BP: 128/76 131/79 130/73   Pulse: 80 88 83   Resp: 18 18 20    Temp: 99.5 F (37.5 C) 98.9 F (37.2 C) 99.2 F (37.3 C)   TempSrc: Oral Oral Oral   SpO2: 100% 98% 100% 98%  Weight:  103.1 kg    Height:        Intake/Output Summary (Last 24 hours) at 04/19/2018 1158 Last data filed at 04/19/2018 0930 Gross per 24 hour  Intake 600 ml  Output 700  ml  Net -100 ml   Filed Weights   04/15/18 0524 04/18/18 0643 04/19/18 0438  Weight: 107 kg 105.4 kg 103.1 kg    Telemetry    afib in the 80's - Personally Reviewed  ECG    No new tracings - Personally Reviewed  Physical Exam   GEN: Obese female, No acute distress.   Neck: + JVD Cardiac: RRR, no murmurs, rubs, or gallops.  Respiratory: Clear to auscultation bilaterally except for few crackles in right base GI: Soft, nontender, non-distended  MS: Taught edema bil lower legs L>R; No deformity. Neuro:  Nonfocal  Psych: Normal affect   Labs    Chemistry Recent Labs  Lab 04/17/18 0414 04/18/18 0434 04/19/18 0413  NA 143 145 144  K 3.5 3.4* 3.5  CL 107 108 105  CO2 29 27 27   GLUCOSE 117* 89 116*  BUN 26* 23 23  CREATININE 1.23* 1.17* 1.39*  CALCIUM 9.0 9.0 9.3  PROT  --  6.2*  --   ALBUMIN  --  2.9*  --   AST  --  24  --   ALT  --  19  --   ALKPHOS  --  74  --   BILITOT  --  1.2  --   GFRNONAA 45* 48* 39*  GFRAA 52* 55* 45*  ANIONGAP 7 10 12      Hematology Recent Labs  Lab 04/15/18 0050 04/16/18 0428 04/17/18 0414  WBC 13.5* 7.9 6.3  RBC 3.33* 3.79* 3.36*  3.36*  HGB 8.0* 8.6* 7.9*  HCT 28.2* 31.7* 28.7*  MCV 84.7 83.6 85.4  MCH 24.0* 22.7* 23.5*  MCHC 28.4* 27.1* 27.5*  RDW 19.3* 19.4* 19.4*  PLT 157 167 160    Cardiac Enzymes Recent Labs  Lab 04/15/18 0050 04/15/18 0720 04/15/18 1319 04/15/18 1838  TROPONINI 0.05* 0.04* 0.05* 0.04*   No results for input(s): TROPIPOC in the last 168 hours.   BNP Recent Labs  Lab 04/15/18 0050  BNP 358.7*     DDimer No results for input(s): DDIMER in the last 168 hours.   Radiology    No results found.  Cardiac Studies   Echocardiogram 04/15/2018 Study Conclusions - Left ventricle: The cavity size was normal. Wall thickness was   increased in a pattern of moderate LVH. Systolic function was   mildly to moderately reduced. The estimated ejection fraction was   in the range of 40% to 45%.  Diffuse hypokinesis. The study is not   technically sufficient to allow evaluation of LV diastolic   function. LV filling pressure is elevated. - Mitral valve: Mildly thickened leaflets . There was moderate   posterior and laterally directed regurgitation. - Left atrium: Massively dilated. - Right ventricle: The cavity size was moderately dilated. Mildly   reduced systolic function. - Right atrium: Severely dilated. - Tricuspid valve: There was moderate regurgitation. - Pulmonary arteries: PA peak pressure: 66 mm Hg (S). - Inferior vena cava: The vessel was dilated. The respirophasic   diameter changes were blunted (< 50%), consistent with elevated   central venous pressure.  Impressions: - Compared to a prior study in 12/2017, the LVEF is lower at 40-45%   Patient Profile     67 y.o. female  here with worsening lower extremity edema, shortness of breath consistent with acute systolic and diastolic heart failure with permanent atrial fibrillation on Eliquis, prior CAD stable.  Assessment & Plan    Acute on chronic diastolic/systolic heart failure exacerbation -EF approximately 45%, similar to echo in 01/2015.  Moderate pulmonary hypertension previously noted as well. -Diuresing with Lasix 80 mg IV twice daily, urine output is not accurate due to "patient leaking".  -Weight is down 5 pounds from yesterday, 13 pounds from admission. -Serum creatinine bumped up today.  Creatinine had been improving from 1.76-1.17 and then bumped up today to 1.39.  Lasix now on hold.  -Pt still appears to be volume overloaded with few crackles in r base, + JVD, LE edema and DOE.  -Recheck metabolic panel in the morning and adjust Lasix dosing.  Leg pain -Hopefully will improve with decrease in edema. Lower extremity US has been done, pending results.  -Pt advised to elevated legs as much as possible. -?neuropathy r/t DM.   Mildly elevated troponin -Consistent with demand ischemia in the setting of  heart failure exacerbation  CAD -PCI in 2006, cardiac catheterization in 2013 showed patent stent, subsequent Myoview was negative in 2016.  Not on aspirin or Plavix due to need for anticoagulation with Eliquis. -Currently stable with no angina  Hyperlipidemia -On atorvastatin and Zetia LDL was 61 and 05/2017 at goal LDL <70  Permanent atrial fibrillation -CHA2DS2/VAS Stroke Risk Score 6 (CHF, vasc dz, HTN, age, DM, female) on anticoagulation with  Eliquis for stroke risk reduction.  No bleeding issues -Rate controlled  Acute kidney injury -Creatinine over the last year has ranged between 1.1 and 1.36.  Creatinine had been improving from 1.76-1.17 and then bumped up today to 1.39.  Diuretics on hold  Hypertension -Blood pressure currently well controlled -Home lisinopril is currently on hold.  Resume once renal function stable.  Morbid obesity -Body mass index is 40.26 kg/m.  Increases her overall cardiovascular risk and contributing to pulmonary hypertension. -Continue to encourage weight loss  Obstructive sleep apnea -Continue CPAP.  Encourage weight loss.  Pulmonary hypertension -OSA and obesity likely a key factor. -Continue blood pressure control, diuresis, CPAP and encourage weight loss.  Anemia -Back in September 2019 hemoglobin 8.2, currently 8.0>7.9. Back in late August her hemoglobin was 11.5. -Evaluation per primary team.Sounds like she has been worked up for this in [place] previously.  Diabetes -Well-controlled with A1c of 6.1 in 12/2017   For questions or updates, please contact [place] Please consult [email or web address] for contact info under        Signed, Daune Perch, NP  04/19/2018, 11:58 AM    Patient seen and examined   I agree with findings as noted by Maryla Morrow above Pt appears a little tired (just got pain med she says)   Breathing is OK ON exam:  JVP is increased Lungs are relatively clear  Cardiac exam:   RRR   No S3 Ext with 1_+  edema     Cr slightly higher today at 1.39     Still with volume increase   Reassess kidney function in am before dosing lasix  Reviewed again salt and fluid restrictions.     Dorris Carnes

## 2018-04-19 NOTE — Care Management Important Message (Signed)
Important Message  Patient Details  Name: ERICKA MARCELLUS MRN: [id number] Date of Birth: [birth year]   Medicare Important Message Given:  Yes    Kerin Salen 04/19/2018, 12:09 [place] Message  Patient Details  Name: ALIA PARSLEY MRN: [id number] Date of Birth: 1950/10/27   Medicare Important Message Given:  Yes    Kerin Salen 04/19/2018, 12:09 PM

## 2018-04-20 DIAGNOSIS — I10 Essential (primary) hypertension: Secondary | ICD-10-CM

## 2018-04-20 DIAGNOSIS — N179 Acute kidney failure, unspecified: Secondary | ICD-10-CM

## 2018-04-20 DIAGNOSIS — I5043 Acute on chronic combined systolic (congestive) and diastolic (congestive) heart failure: Secondary | ICD-10-CM

## 2018-04-20 DIAGNOSIS — D649 Anemia, unspecified: Secondary | ICD-10-CM

## 2018-04-20 DIAGNOSIS — R7989 Other specified abnormal findings of blood chemistry: Secondary | ICD-10-CM

## 2018-04-20 LAB — BASIC METABOLIC PANEL
Anion gap: 11 (ref 5–15)
BUN: 22 mg/dL (ref 8–23)
CHLORIDE: 106 mmol/L (ref 98–111)
CO2: 25 mmol/L (ref 22–32)
Calcium: 9.2 mg/dL (ref 8.9–10.3)
Creatinine, Ser: 1.2 mg/dL — ABNORMAL HIGH (ref 0.44–1.00)
GFR calc Af Amer: 53 mL/min — ABNORMAL LOW (ref >60)
GFR calc non Af Amer: 46 mL/min — ABNORMAL LOW (ref >60)
Glucose, Bld: 100 mg/dL — ABNORMAL HIGH (ref 70–99)
Potassium: 4 mmol/L (ref 3.5–5.1)
Sodium: 142 mmol/L (ref 135–145)

## 2018-04-20 LAB — GLUCOSE, CAPILLARY
Glucose-Capillary: 111 mg/dL — ABNORMAL HIGH (ref 70–99)
Glucose-Capillary: 99 mg/dL (ref 70–99)
Glucose-Capillary: 125 mg/dL — ABNORMAL HIGH (ref 70–99)
Glucose-Capillary: 105 mg/dL — ABNORMAL HIGH (ref 70–99)
Glucose-Capillary: 99 mg/dL (ref 70–99)

## 2018-04-20 LAB — MAGNESIUM: Magnesium: 1.8 mg/dL (ref 1.7–2.4)

## 2018-04-20 MED ORDER — MAGNESIUM SULFATE 2 GM/50ML IV SOLN
2.0000 g | Freq: Once | INTRAVENOUS | Status: AC
  Administered 2018-04-20: 2 g via INTRAVENOUS
  Filled 2018-04-20: qty 50

## 2018-04-20 MED ORDER — FERROUS SULFATE 325 (65 FE) MG PO TABS
325.0000 mg | ORAL_TABLET | Freq: Every day | ORAL | Status: DC
  Administered 2018-04-21 – 2018-04-22 (×2): 325 mg via ORAL
  Filled 2018-04-20 (×2): qty 1

## 2018-04-20 MED ORDER — FERROUS SULFATE 325 (65 FE) MG PO TBEC: 325.0000 mg | DELAYED_RELEASE_TABLET | Freq: Every day | ORAL | Status: DC

## 2018-04-20 MED ORDER — FUROSEMIDE 10 MG/ML IJ SOLN
80.0000 mg | Freq: Every day | INTRAMUSCULAR | Status: DC
  Administered 2018-04-20 – 2018-04-22 (×3): 80 mg via INTRAVENOUS
  Filled 2018-04-20 (×3): qty 8

## 2018-04-20 MED ORDER — APIXABAN 5 MG PO TABS: 5.0000 mg | ORAL_TABLET | Freq: Two times a day (BID) | ORAL | Status: DC

## 2018-04-20 NOTE — Progress Notes (Signed)
Patient had a 23 beat run of vtach. Patient up to the bathroom at that time. VSS. Patient asymptomatic. NP on call notified. No new orders placed. Will continue to monitor closely.

## 2018-04-20 NOTE — Progress Notes (Addendum)
[facility] PROGRESS NOTE    Progress Note  Stephanie Franco  AVW:[id number] DOB: Apr 18, 1951 DOA: 04/14/2018 PCP: Lin Landsman, MD     Brief Narrative:   Stephanie Franco is an 67 y.o. female medical history of permanent atrial fibrillation on Eliquis, shortly sleep apnea, secondary pulmonary hypertension, with an EF of 40%, with history of coronary artery disease, chronic lower extremity edema, hospitalized in September for influenza and bacteremia comes to the ED with worsening lower extremity edema and dyspnea  Assessment/Plan:   Acute on chronic combined systolic and diastolic CHF (congestive heart failure) (HCC)/acute cardiorenal syndrome: Estimated dry weight around 102 kg Venous lower extremity Doppler negative for DVT. She was started on IV diuresis, discontinue Ace wrap applied TED hose. Her lisinopril was held, her dose of Imdur was decreased due to low blood pressure. Cardiology was consulted, and agree with diuresis further management of diuresis per cardiology.  Electrolyte imbalance hypokalemia/hypomagnesemia: Replete orally recheck in the morning.  Chronic atrial fibrillation: Rate controlled continue by systolic and Eliquis.  Acute on chronic kidney disease stage III: Likely due to cardiorenal syndrome improving with diuresis.  Insulin-dependent diabetes mellitus type 2: A1c of 6.2. Continue Lantus plus sliding scale blood glucose well controlled no further episodes of hypoglycemia.  Essential hypertension: Continue by systolic, Imdur and Lasix.  Hyperlipidemia: Continue Lipitor and Zetia.  Anemia of chronic disease: Hemoglobin on 01/11/2018 was 11.7, since then has been drifting down. On admission it was 7.9, will need to follow-up with nephrology as an outpatient.  Gout: Continue allopurinol colchicine.   DVT prophylaxis: lovenxo Family Communication:husband Disposition Plan/Barrier to D/C: home after diured to dry weight Code Status:       Code Status Orders  (From admission, onward)         Start     Ordered   04/15/18 0351  Full code  Continuous     04/15/18 0353        Code Status History    Date Active Date Inactive Code Status Order ID Comments User Context   01/19/2018 1429 01/20/2018 1626 Full Code [id number]  Rod Can, MD Inpatient   10/31/2014 2055 11/03/2014 1456 Full Code [id number]  Ivor Costa, MD Inpatient   06/29/2013 0525 07/01/2013 1714 Full Code [id number]  Berle Mull, MD ED   07/12/2011 0801 07/14/2011 1729 Full Code [id number]  Leanora Ivanoff, RN Inpatient        IV Access:    Peripheral IV   Procedures and diagnostic studies:   Vas [place] Lower Extremity Venous (dvt)  Result Date: 04/19/2018  Lower Venous Study Indications: Edema.  Limitations: Body habitus and poor ultrasound/tissue interface. Performing Technologist: Oliver Hum RVT  Examination Guidelines: A complete evaluation includes B-mode imaging, spectral Doppler, color Doppler, and power Doppler as needed of all accessible portions of each vessel. Bilateral testing is considered an integral part of a complete examination. Limited examinations for reoccurring indications may be performed as noted.  Right Venous Findings: +---------+---------------+---------+-----------+----------+-------+          CompressibilityPhasicitySpontaneityPropertiesSummary +---------+---------------+---------+-----------+----------+-------+ CFV      Full           Yes      Yes                          +---------+---------------+---------+-----------+----------+-------+ SFJ      Full                                                 +---------+---------------+---------+-----------+----------+-------+  FV Prox  Full                                                 +---------+---------------+---------+-----------+----------+-------+ FV Mid   Full                                                  +---------+---------------+---------+-----------+----------+-------+ FV Distal               Yes      Yes                          +---------+---------------+---------+-----------+----------+-------+ PFV      Full                                                 +---------+---------------+---------+-----------+----------+-------+ POP      Full           Yes      Yes                          +---------+---------------+---------+-----------+----------+-------+ PTV      Full                                                 +---------+---------------+---------+-----------+----------+-------+ PERO     Full                                                 +---------+---------------+---------+-----------+----------+-------+  Left Venous Findings: +---------+---------------+---------+-----------+----------+-------+          CompressibilityPhasicitySpontaneityPropertiesSummary +---------+---------------+---------+-----------+----------+-------+ CFV      Full           Yes      Yes                          +---------+---------------+---------+-----------+----------+-------+ SFJ      Full                                                 +---------+---------------+---------+-----------+----------+-------+ FV Prox  Full                                                 +---------+---------------+---------+-----------+----------+-------+ FV Mid   Full                                                 +---------+---------------+---------+-----------+----------+-------+  FV Distal               Yes      Yes                          +---------+---------------+---------+-----------+----------+-------+ PFV      Full                                                 +---------+---------------+---------+-----------+----------+-------+ POP      Full           Yes      Yes                           +---------+---------------+---------+-----------+----------+-------+ PTV      Full                                                 +---------+---------------+---------+-----------+----------+-------+ PERO     Full                                                 +---------+---------------+---------+-----------+----------+-------+    Summary: Right: There is no evidence of deep vein thrombosis in the lower extremity. However, portions of this examination were limited- see technologist comments above. No cystic structure found in the popliteal fossa. Left: There is no evidence of deep vein thrombosis in the lower extremity. However, portions of this examination were limited- see technologist comments above. No cystic structure found in the popliteal fossa.  *See table(s) above for measurements and observations. Electronically signed by Servando Snare MD on 04/19/2018 at 3:29:13 PM.    Final      Medical Consultants:    None.  Anti-Infectives:   None  Subjective:    Stephanie Franco she relates her breathing is slightly better she cannot lay flat to sleep.  Objective:    Vitals:   04/19/18 2046 04/19/18 2216 04/19/18 2317 04/20/18 0646  BP:  125/65 126/66 134/85  Pulse: 93 77 86 79  Resp: 16 14 14 20   Temp:  99.2 F (37.3 C) 99.3 F (37.4 C) 99.9 F (37.7 C)  TempSrc:  Oral Oral Oral  SpO2: 96% 97% 98% 98%  Weight:    103.6 kg  Height:        Intake/Output Summary (Last 24 hours) at 04/20/2018 0836 Last data filed at 04/20/2018 0654 Gross per 24 hour  Intake 360 ml  Output -  Net 360 ml   Filed Weights   04/18/18 0643 04/19/18 0438 04/20/18 0646  Weight: 105.4 kg 103.1 kg 103.6 kg    Exam: General exam: In no acute distress. Respiratory system: Good air movement and clear to auscultation. Cardiovascular system: S1 & S2 heard, RRR. + JVD. Gastrointestinal system: Abdomen is nondistended, soft and nontender.  Central nervous system: Alert and oriented. No focal  neurological deficits. Extremities: 3+ edema Skin: No rashes, lesions or ulcers Psychiatry: Judgement and insight appear normal. Mood & affect appropriate.    Data Reviewed:    Labs: Basic Metabolic Panel:  Recent Labs  Lab 04/16/18 0428 04/17/18 0414 04/18/18 0434 04/19/18 0413 04/20/18 0425  NA 146* 143 145 144 142  K 3.5 3.5 3.4* 3.5 4.0  CL 107 107 108 105 106  CO2 27 29 27 27 25   GLUCOSE 62* 117* 89 116* 100*  BUN 28* 26* 23 23 22   CREATININE 1.25* 1.23* 1.17* 1.39* 1.20*  CALCIUM 9.1 9.0 9.0 9.3 9.2  MG 1.7 2.1 1.8 1.7 1.8   GFR Estimated Creatinine Clearance: 50.9 mL/min (A) (by C-G formula based on SCr of 1.2 mg/dL (H)). Liver Function Tests: Recent Labs  Lab 04/18/18 0434  AST 24  ALT 19  ALKPHOS 74  BILITOT 1.2  PROT 6.2*  ALBUMIN 2.9*   No results for input(s): LIPASE, AMYLASE in the last 168 hours. No results for input(s): AMMONIA in the last 168 hours. Coagulation profile Recent Labs  Lab 04/15/18 0050  INR 2.39    CBC: Recent Labs  Lab 04/15/18 0050 04/16/18 0428 04/17/18 0414  WBC 13.5* 7.9 6.3  NEUTROABS  --  5.2 3.6  HGB 8.0* 8.6* 7.9*  HCT 28.2* 31.7* 28.7*  MCV 84.7 83.6 85.4  PLT 157 167 160   Cardiac Enzymes: Recent Labs  Lab 04/15/18 0050 04/15/18 0720 04/15/18 1319 04/15/18 1838  TROPONINI 0.05* 0.04* 0.05* 0.04*   BNP (last 3 results) No results for input(s): PROBNP in the last 8760 hours. CBG: Recent Labs  Lab 04/19/18 0736 04/19/18 1238 04/19/18 1651 04/19/18 2218 04/20/18 0742  GLUCAP 136* 107* 106* 111* 99   D-Dimer: No results for input(s): DDIMER in the last 72 hours. Hgb A1c: No results for input(s): HGBA1C in the last 72 hours. Lipid Profile: No results for input(s): CHOL, HDL, LDLCALC, TRIG, CHOLHDL, LDLDIRECT in the last 72 hours. Thyroid function studies: No results for input(s): TSH, T4TOTAL, T3FREE, THYROIDAB in the last 72 hours.  Invalid input(s): FREET3 Anemia work up: No results for  input(s): VITAMINB12, FOLATE, FERRITIN, TIBC, IRON, RETICCTPCT in the last 72 hours. Sepsis Labs: Recent Labs  Lab 04/15/18 0050 04/16/18 0428 04/17/18 0414  WBC 13.5* 7.9 6.3   Microbiology No results found for this or any previous visit (from the past 240 hour(s)).   Medications:   . allopurinol  300 mg Oral BID  . apixaban  5 mg Oral BID  . atorvastatin  40 mg Oral Q1400  . colchicine  0.6 mg Oral QPM  . ezetimibe  10 mg Oral Q1400  . furosemide  80 mg Intravenous Daily  . gabapentin  100 mg Oral QHS  . hydrocerin   Topical BID  . insulin aspart  0-20 Units Subcutaneous TID WC  . insulin glargine  14 Units Subcutaneous Q supper  . isosorbide mononitrate  15 mg Oral Daily  . nebivolol  10 mg Oral Daily  . pantoprazole  40 mg Oral Daily  . potassium chloride  20 mEq Oral BID  . senna-docusate  1 tablet Oral BID  . traZODone  50 mg Oral QHS   Continuous Infusions:    LOS: 4 days   Charlynne Cousins  [facility]   *Please refer to [email or web address], password [email or web address] to get updated schedule on who will round on this patient, as hospitalists switch teams weekly. If 7PM-7AM, please contact night-coverage at [email or web address], password [email or web address] for any overnight needs.  04/20/2018, 8:36 AM

## 2018-04-20 NOTE — Progress Notes (Addendum)
Progress Note  Patient Name: Stephanie Franco Date of Encounter: 04/20/2018  Primary Cardiologist: Glenetta Hew, MD   Subjective   Stephanie Franco is sitting up in the chair and reports that she is feeling better today.  She feels that her lower leg edema has improved and her leg pain is not bothering her as much.  She says that she slept well last night for the first time in quite a while.  She did have brief shortness of breath with being up to the bathroom this morning, but she says she recovered very quickly.  Inpatient Medications    Scheduled Meds: . allopurinol  300 mg Oral BID  . apixaban  5 mg Oral BID  . atorvastatin  40 mg Oral Q1400  . colchicine  0.6 mg Oral QPM  . ezetimibe  10 mg Oral Q1400  . gabapentin  100 mg Oral QHS  . hydrocerin   Topical BID  . insulin aspart  0-20 Units Subcutaneous TID WC  . insulin glargine  14 Units Subcutaneous Q supper  . isosorbide mononitrate  15 mg Oral Daily  . nebivolol  10 mg Oral Daily  . pantoprazole  40 mg Oral Daily  . potassium chloride  20 mEq Oral BID  . senna-docusate  1 tablet Oral BID  . traZODone  50 mg Oral QHS   Continuous Infusions:  PRN Meds: acetaminophen **OR** acetaminophen, diphenhydrAMINE, HYDROcodone-acetaminophen, ipratropium, levalbuterol, LORazepam, ondansetron **OR** ondansetron (ZOFRAN) IV   Vital Signs    Vitals:   04/19/18 2046 04/19/18 2216 04/19/18 2317 04/20/18 0646  BP:  125/65 126/66 134/85  Pulse: 93 77 86 79  Resp: 16 14 14 20   Temp:  99.2 F (37.3 C) 99.3 F (37.4 C) 99.9 F (37.7 C)  TempSrc:  Oral Oral Oral  SpO2: 96% 97% 98% 98%  Weight:    103.6 kg  Height:        Intake/Output Summary (Last 24 hours) at 04/20/2018 0733 Last data filed at 04/20/2018 0654 Gross per 24 hour  Intake 360 ml  Output -  Net 360 ml   Filed Weights   04/18/18 0643 04/19/18 0438 04/20/18 0646  Weight: 105.4 kg 103.1 kg 103.6 kg    Telemetry    Atrial fibrillation in the 70s with occasional  NSVT- Personally Reviewed  ECG    No new tracings- Personally Reviewed  Physical Exam   GEN: No acute distress.   Neck: + JVD Cardiac:  Irregularly irregular rhythm, no murmurs, rubs, or gallops.  Respiratory: Clear to auscultation bilaterally. GI: Soft, nontender, non-distended  MS:  Trace, taut lower leg edema; No deformity. Neuro:  Nonfocal  Psych: Normal affect   Labs    Chemistry Recent Labs  Lab 04/18/18 0434 04/19/18 0413 04/20/18 0425  NA 145 144 142  K 3.4* 3.5 4.0  CL 108 105 106  CO2 27 27 25   GLUCOSE 89 116* 100*  BUN 23 23 22   CREATININE 1.17* 1.39* 1.20*  CALCIUM 9.0 9.3 9.2  PROT 6.2*  --   --   ALBUMIN 2.9*  --   --   AST 24  --   --   ALT 19  --   --   ALKPHOS 74  --   --   BILITOT 1.2  --   --   GFRNONAA 48* 39* 46*  GFRAA 55* 45* 53*  ANIONGAP 10 12 11      Hematology Recent Labs  Lab 04/15/18 0050 04/16/18 0428 04/17/18 0414  WBC 13.5* 7.9 6.3  RBC 3.33* 3.79* 3.36*  3.36*  HGB 8.0* 8.6* 7.9*  HCT 28.2* 31.7* 28.7*  MCV 84.7 83.6 85.4  MCH 24.0* 22.7* 23.5*  MCHC 28.4* 27.1* 27.5*  RDW 19.3* 19.4* 19.4*  PLT 157 167 160    Cardiac Enzymes Recent Labs  Lab 04/15/18 0050 04/15/18 0720 04/15/18 1319 04/15/18 1838  TROPONINI 0.05* 0.04* 0.05* 0.04*   No results for input(s): TROPIPOC in the last 168 hours.   BNP Recent Labs  Lab 04/15/18 0050  BNP 358.7*     DDimer No results for input(s): DDIMER in the last 168 hours.   Radiology    Vas [place] Lower Extremity Venous (dvt)  Result Date: 04/19/2018  Lower Venous Study Indications: Edema.  Limitations: Body habitus and poor ultrasound/tissue interface. Performing Technologist: Oliver Hum RVT  Examination Guidelines: A complete evaluation includes B-mode imaging, spectral Doppler, color Doppler, and power Doppler as needed of all accessible portions of each vessel. Bilateral testing is considered an integral part of a complete examination. Limited examinations for  reoccurring indications may be performed as noted.  Right Venous Findings: +---------+---------------+---------+-----------+----------+-------+          CompressibilityPhasicitySpontaneityPropertiesSummary +---------+---------------+---------+-----------+----------+-------+ CFV      Full           Yes      Yes                          +---------+---------------+---------+-----------+----------+-------+ SFJ      Full                                                 +---------+---------------+---------+-----------+----------+-------+ FV Prox  Full                                                 +---------+---------------+---------+-----------+----------+-------+ FV Mid   Full                                                 +---------+---------------+---------+-----------+----------+-------+ FV Distal               Yes      Yes                          +---------+---------------+---------+-----------+----------+-------+ PFV      Full                                                 +---------+---------------+---------+-----------+----------+-------+ POP      Full           Yes      Yes                          +---------+---------------+---------+-----------+----------+-------+ PTV      Full                                                 +---------+---------------+---------+-----------+----------+-------+  PERO     Full                                                 +---------+---------------+---------+-----------+----------+-------+  Left Venous Findings: +---------+---------------+---------+-----------+----------+-------+          CompressibilityPhasicitySpontaneityPropertiesSummary +---------+---------------+---------+-----------+----------+-------+ CFV      Full           Yes      Yes                          +---------+---------------+---------+-----------+----------+-------+ SFJ      Full                                                  +---------+---------------+---------+-----------+----------+-------+ FV Prox  Full                                                 +---------+---------------+---------+-----------+----------+-------+ FV Mid   Full                                                 +---------+---------------+---------+-----------+----------+-------+ FV Distal               Yes      Yes                          +---------+---------------+---------+-----------+----------+-------+ PFV      Full                                                 +---------+---------------+---------+-----------+----------+-------+ POP      Full           Yes      Yes                          +---------+---------------+---------+-----------+----------+-------+ PTV      Full                                                 +---------+---------------+---------+-----------+----------+-------+ PERO     Full                                                 +---------+---------------+---------+-----------+----------+-------+    Summary: Right: There is no evidence of deep vein thrombosis in the lower extremity. However, portions of this examination were limited- see technologist comments above. No cystic structure found in the popliteal fossa. Left: There is no evidence of deep vein thrombosis in the lower extremity.  However, portions of this examination were limited- see technologist comments above. No cystic structure found in the popliteal fossa.  *See table(s) above for measurements and observations. Electronically signed by Servando Snare MD on 04/19/2018 at 3:29:13 PM.    Final     Cardiac Studies   Echocardiogram 04/15/2018 Study Conclusions - Left ventricle: The cavity size was normal. Wall thickness was increased in a pattern of moderate LVH. Systolic function was mildly to moderately reduced. The estimated ejection fraction was in the range of 40% to 45%. Diffuse hypokinesis. The study is  not technically sufficient to allow evaluation of LV diastolic function. LV filling pressure is elevated. - Mitral valve: Mildly thickened leaflets . There was moderate posterior and laterally directed regurgitation. - Left atrium: Massively dilated. - Right ventricle: The cavity size was moderately dilated. Mildly reduced systolic function. - Right atrium: Severely dilated. - Tricuspid valve: There was moderate regurgitation. - Pulmonary arteries: PA peak pressure: 66 mm Hg (S). - Inferior vena cava: The vessel was dilated. The respirophasic diameter changes were blunted (<50%), consistent with elevated central venous pressure.  Impressions: - Compared to a prior study in 12/2017, the LVEF is lower at 40-45%   Patient Profile     67 y.o. female here with worsening lower extremity edema, shortness of breath consistent with acute systolic and diastolic heart failure with permanent atrial fibrillation on Eliquis, prior CAD stable.  Assessment & Plan    Acute on chronic diastolic/systolic heart failure exacerbation -Patient has had recent life stressors with interruption in her diuretic regimen. -EF 45%, similar to echo in 01/2015.  Moderate pulmonary hypertension also previously noted. -Patient has been diuresing with Lasix 80 mg IV twice daily until creatinine bumped yesterday and diuretic held.  Creatinine is better today and patient is still volume overloaded on exam.  Will resume Lasix at once daily dosing 80 mg IV -Weight is not much changed from yesterday. -Patient is feeling much better and slept well last night.  She will likely be ready for discharge soon, possibly tomorrow afternoon.  Acute kidney injury -Creatinine over the last year has ranged between 1.1 and 1.36.  Creatinine had been improving from 1.76-1.17 and then bumped up today to 1.39.  Diuretics held yesterday. Cr improved to 1.20. Will resume diuretic.   NSVT vs afib with abberancy -Patient is  in atrial fibrillation and had several short runs of WCT on monitor.  Potassium is normal at 4.0.  Magnesium is low normal at 1.8, will order magnesium 2 g IV.  Leg pain -No overt DVT noted on lower extremity ultrasound however exam was limited.  Pain possibly related to taut edema. -Can consider vascular evaluation if her pain continues after improvement in edema, outpatient.  Mildly elevated troponin -Consistent with demand ischemia in the setting of heart failure exacerbation.  Patient has had no chest pain or discomfort.  CAD -PCI in 2006, cardiac catheterization in 2013 showed patent stent, subsequent Myoview was negative in 2016.  Not on aspirin or Plavix due to need for anticoagulation with Eliquis. -Currently stable with no angina  Hyperlipidemia -On atorvastatin and Zetia LDL was 61 and 05/2017 at goal LDL <70  Permanent atrial fibrillation -CHA2DS2/VAS Stroke Risk Score 6 (CHF, vasc dz, HTN, age, DM, female) on anticoagulation with Eliquis for stroke risk reduction.  No bleeding issues -Rate controlled on Bystolic  Hypertension -Blood pressure currently well controlled -Home lisinopril is currently on hold.  Resume once renal function stable.  Morbid obesity -Body mass index  is 40.26 kg/m.  Increases her overall cardiovascular risk and contributing to pulmonary hypertension. -Continue to encourage weight loss  Obstructive sleep apnea -Continue CPAP.  Encourage weight loss.  Pulmonary hypertension -OSA and obesity likely a key factor. -Continue blood pressure control, diuresis, CPAP and encourage weight loss.  Diabetes - A1c of 6.1 in 12/2017.  On SSI while hospitalized   For questions or updates, please contact [facility] Please consult [email or web address] for contact info under        Signed, Daune Perch, NP  04/20/2018, 7:33 AM    Pt seen and examined   I agree with findings as noted by Maryla Morrow above Pt appears comfortable in chair   Volume still  appears increased on exam  Improved from Monday though Neck is full  JVP increase Lungs are relatively clear Cardiac exam:   Irreg irreg   No S3 Ext with 1+ edema  Due to bump in Cr diuresis has slowed    Reassess in AM  [employer]

## 2018-04-21 ENCOUNTER — Other Ambulatory Visit (HOSPITAL_COMMUNITY): Payer: Medicare Other

## 2018-04-21 LAB — GLUCOSE, CAPILLARY
Glucose-Capillary: 90 mg/dL (ref 70–99)
Glucose-Capillary: 138 mg/dL — ABNORMAL HIGH (ref 70–99)
Glucose-Capillary: 124 mg/dL — ABNORMAL HIGH (ref 70–99)
Glucose-Capillary: 137 mg/dL — ABNORMAL HIGH (ref 70–99)

## 2018-04-21 LAB — BASIC METABOLIC PANEL
Anion gap: 14 (ref 5–15)
BUN: 25 mg/dL — ABNORMAL HIGH (ref 8–23)
CO2: 24 mmol/L (ref 22–32)
Calcium: 9.4 mg/dL (ref 8.9–10.3)
Chloride: 104 mmol/L (ref 98–111)
Creatinine, Ser: 1.4 mg/dL — ABNORMAL HIGH (ref 0.44–1.00)
GFR calc Af Amer: 44 mL/min — ABNORMAL LOW (ref >60)
GFR calc non Af Amer: 38 mL/min — ABNORMAL LOW (ref >60)
GLUCOSE: 150 mg/dL — AB (ref 70–99)
Potassium: 3.9 mmol/L (ref 3.5–5.1)
Sodium: 142 mmol/L (ref 135–145)

## 2018-04-21 LAB — CBC
HCT: 32 % — ABNORMAL LOW (ref 36.0–46.0)
HEMOGLOBIN: 8.9 g/dL — AB (ref 12.0–15.0)
MCH: 23.4 pg — ABNORMAL LOW (ref 26.0–34.0)
MCHC: 27.8 g/dL — ABNORMAL LOW (ref 30.0–36.0)
MCV: 84.2 fL (ref 80.0–100.0)
Platelets: 244 10*3/uL (ref 150–400)
RBC: 3.8 MIL/uL — ABNORMAL LOW (ref 3.87–5.11)
RDW: 19.9 % — ABNORMAL HIGH (ref 11.5–15.5)
WBC: 5.7 10*3/uL (ref 4.0–10.5)
nRBC: 0 % (ref 0.0–0.2)

## 2018-04-21 NOTE — Progress Notes (Addendum)
Progress Note  Patient Name: Stephanie Franco Date of Encounter: 04/21/2018  Primary Cardiologist: Glenetta Hew, MD   Subjective   Gradually feel that she is improving. Breathing improved. Less dyspnea on exertion. Still with LEE, but pt notes significant improvement. No CP. She notes little UOP yesterday.   Inpatient Medications    Scheduled Meds: . allopurinol  300 mg Oral BID  . apixaban  5 mg Oral BID  . atorvastatin  40 mg Oral Q1400  . colchicine  0.6 mg Oral QPM  . ezetimibe  10 mg Oral Q1400  . ferrous sulfate  325 mg Oral Q breakfast  . furosemide  80 mg Intravenous Daily  . gabapentin  100 mg Oral QHS  . hydrocerin   Topical BID  . insulin aspart  0-20 Units Subcutaneous TID WC  . insulin glargine  14 Units Subcutaneous Q supper  . isosorbide mononitrate  15 mg Oral Daily  . nebivolol  10 mg Oral Daily  . pantoprazole  40 mg Oral Daily  . potassium chloride  20 mEq Oral BID  . senna-docusate  1 tablet Oral BID  . traZODone  50 mg Oral QHS   Continuous Infusions:  PRN Meds: acetaminophen **OR** acetaminophen, diphenhydrAMINE, HYDROcodone-acetaminophen, ipratropium, levalbuterol, LORazepam, ondansetron **OR** ondansetron (ZOFRAN) IV   Vital Signs    Vitals:   04/20/18 1352 04/20/18 2112 04/20/18 2132 04/21/18 0555  BP: 128/88  139/82 125/75  Pulse: 85 77 71 87  Resp: 16 18 18 18   Temp: 99.5 F (37.5 C)  99.6 F (37.6 C) 99.4 F (37.4 C)  TempSrc: Oral  Oral Oral  SpO2: 96% 96% 98% 97%  Weight:    104.3 kg  Height:        Intake/Output Summary (Last 24 hours) at 04/21/2018 0906 Last data filed at 04/21/2018 0836 Gross per 24 hour  Intake 1140 ml  Output 1350 ml  Net -210 ml   Filed Weights   04/19/18 0438 04/20/18 0646 04/21/18 0555  Weight: 103.1 kg 103.6 kg 104.3 kg    Telemetry    afib 80s-90s - Personally Reviewed  ECG    Not performed today- Personally Reviewed  Physical Exam   GEN: Moderately obese No acute distress.   Neck:  No JVD Cardiac: irregularly irregular rhythm, regular rate. Murmur present Respiratory: Clear to auscultation bilaterally. GI: Soft, nontender, non-distended  MS: 1+ bilateral LEE pitting edema Neuro:  Nonfocal  Psych: Normal affect   Labs    Chemistry Recent Labs  Lab 04/18/18 0434 04/19/18 0413 04/20/18 0425  NA 145 144 142  K 3.4* 3.5 4.0  CL 108 105 106  CO2 27 27 25   GLUCOSE 89 116* 100*  BUN 23 23 22   CREATININE 1.17* 1.39* 1.20*  CALCIUM 9.0 9.3 9.2  PROT 6.2*  --   --   ALBUMIN 2.9*  --   --   AST 24  --   --   ALT 19  --   --   ALKPHOS 74  --   --   BILITOT 1.2  --   --   GFRNONAA 48* 39* 46*  GFRAA 55* 45* 53*  ANIONGAP 10 12 11      Hematology Recent Labs  Lab 04/15/18 0050 04/16/18 0428 04/17/18 0414  WBC 13.5* 7.9 6.3  RBC 3.33* 3.79* 3.36*  3.36*  HGB 8.0* 8.6* 7.9*  HCT 28.2* 31.7* 28.7*  MCV 84.7 83.6 85.4  MCH 24.0* 22.7* 23.5*  MCHC 28.4* 27.1* 27.5*  RDW 19.3* 19.4* 19.4*  PLT 157 167 160    Cardiac Enzymes Recent Labs  Lab 04/15/18 0050 04/15/18 0720 04/15/18 1319 04/15/18 1838  TROPONINI 0.05* 0.04* 0.05* 0.04*   No results for input(s): TROPIPOC in the last 168 hours.   BNP Recent Labs  Lab 04/15/18 0050  BNP 358.7*     DDimer No results for input(s): DDIMER in the last 168 hours.   Radiology    Vas [place] Lower Extremity Venous (dvt)  Result Date: 04/19/2018  Lower Venous Study Indications: Edema.  Limitations: Body habitus and poor ultrasound/tissue interface. Performing Technologist: Oliver Hum RVT  Examination Guidelines: A complete evaluation includes B-mode imaging, spectral Doppler, color Doppler, and power Doppler as needed of all accessible portions of each vessel. Bilateral testing is considered an integral part of a complete examination. Limited examinations for reoccurring indications may be performed as noted.  Right Venous Findings: +---------+---------------+---------+-----------+----------+-------+           CompressibilityPhasicitySpontaneityPropertiesSummary +---------+---------------+---------+-----------+----------+-------+ CFV      Full           Yes      Yes                          +---------+---------------+---------+-----------+----------+-------+ SFJ      Full                                                 +---------+---------------+---------+-----------+----------+-------+ FV Prox  Full                                                 +---------+---------------+---------+-----------+----------+-------+ FV Mid   Full                                                 +---------+---------------+---------+-----------+----------+-------+ FV Distal               Yes      Yes                          +---------+---------------+---------+-----------+----------+-------+ PFV      Full                                                 +---------+---------------+---------+-----------+----------+-------+ POP      Full           Yes      Yes                          +---------+---------------+---------+-----------+----------+-------+ PTV      Full                                                 +---------+---------------+---------+-----------+----------+-------+ PERO     Full                                                 +---------+---------------+---------+-----------+----------+-------+  Left Venous Findings: +---------+---------------+---------+-----------+----------+-------+          CompressibilityPhasicitySpontaneityPropertiesSummary +---------+---------------+---------+-----------+----------+-------+ CFV      Full           Yes      Yes                          +---------+---------------+---------+-----------+----------+-------+ SFJ      Full                                                 +---------+---------------+---------+-----------+----------+-------+ FV Prox  Full                                                  +---------+---------------+---------+-----------+----------+-------+ FV Mid   Full                                                 +---------+---------------+---------+-----------+----------+-------+ FV Distal               Yes      Yes                          +---------+---------------+---------+-----------+----------+-------+ PFV      Full                                                 +---------+---------------+---------+-----------+----------+-------+ POP      Full           Yes      Yes                          +---------+---------------+---------+-----------+----------+-------+ PTV      Full                                                 +---------+---------------+---------+-----------+----------+-------+ PERO     Full                                                 +---------+---------------+---------+-----------+----------+-------+    Summary: Right: There is no evidence of deep vein thrombosis in the lower extremity. However, portions of this examination were limited- see technologist comments above. No cystic structure found in the popliteal fossa. Left: There is no evidence of deep vein thrombosis in the lower extremity. However, portions of this examination were limited- see technologist comments above. No cystic structure found in the popliteal fossa.  *See table(s) above for measurements and observations. Electronically signed by Servando Snare MD on 04/19/2018 at 3:29:13 PM.    Final     Cardiac Studies   Echocardiogram 04/15/2018 Study Conclusions - Left  ventricle: The cavity size was normal. Wall thickness was increased in a pattern of moderate LVH. Systolic function was mildly to moderately reduced. The estimated ejection fraction was in the range of 40% to 45%. Diffuse hypokinesis. The study is not technically sufficient to allow evaluation of LV diastolic function. LV filling pressure is elevated. - Mitral valve: Mildly thickened  leaflets . There was moderate posterior and laterally directed regurgitation. - Left atrium: Massively dilated. - Right ventricle: The cavity size was moderately dilated. Mildly reduced systolic function. - Right atrium: Severely dilated. - Tricuspid valve: There was moderate regurgitation. - Pulmonary arteries: PA peak pressure: 66 mm Hg (S). - Inferior vena cava: The vessel was dilated. The respirophasic diameter changes were blunted (<50%), consistent with elevated central venous pressure.  Impressions: - Compared to a prior study in 12/2017, the LVEF is lower at 40-45%  Patient Profile     67 y.o. female here with worsening lower extremity edema, shortness of breath consistent with acute systolic and diastolic heart failure with permanent atrial fibrillation on Eliquis, prior CAD stable.  Assessment & Plan   1. 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 -Echo this admit showed mild-moderately  reduced EF at 40- 45%, similar to echo in 01/2015.  Moderate pulmonary hypertension also previously noted. -Treating w/ IV Lasix. Only 800 cc of UOP recorded yesterday. Net I/Os since admit recorded at -1.7L. Pt notes little UOP yesterday. -Weight is not much changed from yesterday. Actually 1 lb higher.  - Symptoms improved but still volume overloaded on exam with 1+ bilateral LE pitting edema. Repeat BMP pending.  -She needs additional diuresis. Continue Lasix. Continue fluid restriction. Continue to monitor daily weights, strict I/Os and daily BMPs. Low salt diet.   2. Acute kidney injury -Creatinine over the last year has ranged between 1.1 and 1.36. Creatinine had been improving from 1.76-1.17 and then bumped up today to 1.39 on 12/3. Diuretics held x 1 days and Cr improved to 1.20. Lasix resumed yesterday at 80 mg IV BID. Will order repeat BMP today to reassess renal function  and electrolytes.   NSVT vs afib with abberancy -Patient is in atrial fibrillation and had several short runs of WCT on monitor.  Potassium is normal at 4.0.  Magnesium was low normal yesterday at 1.8. Supplemental magnesium 2 g IV was given.   Leg pain -No overt DVT noted on lower extremity ultrasound however exam was limited.  Pain possibly related to taut edema. -Can consider vascular evaluation if her pain continues after improvement in edema, outpatient.  Mildly elevated troponin -Consistent with demand ischemia in the setting of heart failure exacerbation.  Patient has had no chest pain or discomfort. No plans for ischemic evaluation this admission.   CAD -s/p PCI in 2006, last cardiac catheterization in 2013 showed patent stent, subsequent Myoview was negative in 2016. Not on aspirin or Plavix due to need for anticoagulation with Eliquis. -Currently stable with no angina. Continue current regimen. On  blocker and statin. ACEi on hold for AKI.   Hyperlipidemia -On atorvastatin and Zetia LDL was 61 and 05/2017 at goal LDL<70. Continue regimen.   Permanent atrial fibrillation -CHA2DS2/VAS Stroke RiskScore6 (CHF,vasc dz,HTN, age, DM, female)on anticoagulation with Eliquis for stroke risk reduction. No bleeding issues -Rate controlled on Bystolic. HR in the 80s.   Hypertension -Blood pressure currently well controlled. 125/75 this morning.  -Home lisinopril is currently on hold due to AKI.  Resume once renal function stable.  Morbid obesity -Body mass index is 40.26 kg/m.Continue to encourage weight loss  Obstructive sleep apnea -Continue CPAP.Encourage weight loss.  Pulmonary hypertension -OSA and obesity likely a key factor. -Continue blood pressure control, diuresis, CPAP and encourage weight loss.  Diabetes - A1c of 6.1 in 12/2017.  On SSI while hospitalized. Management per primary.    For questions or updates, please contact [place] Please  consult [email or web address] for contact info under        Signed, Lyda Jester, PA-C  04/21/2018, 9:06 AM    Pt seen and examined   I agree with findings as noted by B Simmons above Pt comfortable  Did better with PT she says  Neck:  JVP is increase Lungs are CTA    Cardiac Irreg irreg Ext with 1+ edema (pt says at baseline)   Pt improved from admit   Still with some volume increase  Got IV lasix today    Admits to noncompliAnce with diet this fall    Will reassess in am    Close to d/c   She is on Eliquis for atrial fibrillation   Need to repeat CBC today     Dorris Carnes

## 2018-04-21 NOTE — Progress Notes (Signed)
[facility] PROGRESS NOTE    Progress Note  Stephanie Franco  QVZ:[id number] DOB: [birth year] DOA: 04/14/2018 PCP: Lin Landsman, MD     Brief Narrative:   Stephanie Franco is an 67 y.o. female medical history of permanent atrial fibrillation on Eliquis, shortly sleep apnea, secondary pulmonary hypertension, with an EF of 40%, with history of coronary artery disease, chronic lower extremity edema, hospitalized in September for influenza and bacteremia comes to the ED with worsening lower extremity edema and dyspnea  Assessment/Plan:   Acute on chronic combined systolic and diastolic CHF (congestive heart failure) (HCC)/acute cardiorenal syndrome: Estimated dry weight around 102 kg Venous lower extremity Doppler negative for DVT. Her lisinopril was held, her dose of Imdur was decreased due to low blood pressure. She is fluid overloaded, her Lasix was decreased yesterday. Basic metabolic panel is pending this morning. Further management per cardiology.  Electrolyte imbalance hypokalemia/hypomagnesemia: Basic metabolic panel is pending this morning.  Chronic atrial fibrillation: Rate controlled continue by systolic and Eliquis.  Acute on chronic kidney disease stage III: Creatinine improved with diuresis. Basic metabolic panel is pending.  Insulin-dependent diabetes mellitus type 2: A1c of 6.2. Continue Lantus plus sliding scale blood glucose well controlled no further episodes of hypoglycemia.  Essential hypertension: Continue bisystolic, Imdur and Lasix.  Hyperlipidemia: Continue Lipitor and Zetia.  Anemia of chronic disease: On admission it was 7.9, will need to follow-up with nephrology as an outpatient.  Gout: Continue allopurinol colchicine.   DVT prophylaxis: lovenxo Family Communication:husband Disposition Plan/Barrier to D/C: home after diuresed to dry weight Code Status:     Code Status Orders  (From admission, onward)         Start     Ordered    04/15/18 0351  Full code  Continuous     04/15/18 0353        Code Status History    Date Active Date Inactive Code Status Order ID Comments User Context   01/19/2018 1429 01/20/2018 1626 Full Code [id number]  Rod Can, MD Inpatient   10/31/2014 2055 11/03/2014 1456 Full Code [id number]  Ivor Costa, MD Inpatient   06/29/2013 0525 07/01/2013 1714 Full Code [id number]  Berle Mull, MD ED   07/12/2011 0801 07/14/2011 1729 Full Code [id number]  Leanora Ivanoff, RN Inpatient        IV Access:    Peripheral IV   Procedures and diagnostic studies:   Vas [place] Lower Extremity Venous (dvt)  Result Date: 04/19/2018  Lower Venous Study Indications: Edema.  Limitations: Body habitus and poor ultrasound/tissue interface. Performing Technologist: Oliver Hum RVT  Examination Guidelines: A complete evaluation includes B-mode imaging, spectral Doppler, color Doppler, and power Doppler as needed of all accessible portions of each vessel. Bilateral testing is considered an integral part of a complete examination. Limited examinations for reoccurring indications may be performed as noted.  Right Venous Findings: +---------+---------------+---------+-----------+----------+-------+          CompressibilityPhasicitySpontaneityPropertiesSummary +---------+---------------+---------+-----------+----------+-------+ CFV      Full           Yes      Yes                          +---------+---------------+---------+-----------+----------+-------+ SFJ      Full                                                 +---------+---------------+---------+-----------+----------+-------+  FV Prox  Full                                                 +---------+---------------+---------+-----------+----------+-------+ FV Mid   Full                                                 +---------+---------------+---------+-----------+----------+-------+ FV Distal               Yes      Yes                           +---------+---------------+---------+-----------+----------+-------+ PFV      Full                                                 +---------+---------------+---------+-----------+----------+-------+ POP      Full           Yes      Yes                          +---------+---------------+---------+-----------+----------+-------+ PTV      Full                                                 +---------+---------------+---------+-----------+----------+-------+ PERO     Full                                                 +---------+---------------+---------+-----------+----------+-------+  Left Venous Findings: +---------+---------------+---------+-----------+----------+-------+          CompressibilityPhasicitySpontaneityPropertiesSummary +---------+---------------+---------+-----------+----------+-------+ CFV      Full           Yes      Yes                          +---------+---------------+---------+-----------+----------+-------+ SFJ      Full                                                 +---------+---------------+---------+-----------+----------+-------+ FV Prox  Full                                                 +---------+---------------+---------+-----------+----------+-------+ FV Mid   Full                                                 +---------+---------------+---------+-----------+----------+-------+  FV Distal               Yes      Yes                          +---------+---------------+---------+-----------+----------+-------+ PFV      Full                                                 +---------+---------------+---------+-----------+----------+-------+ POP      Full           Yes      Yes                          +---------+---------------+---------+-----------+----------+-------+ PTV      Full                                                  +---------+---------------+---------+-----------+----------+-------+ PERO     Full                                                 +---------+---------------+---------+-----------+----------+-------+    Summary: Right: There is no evidence of deep vein thrombosis in the lower extremity. However, portions of this examination were limited- see technologist comments above. No cystic structure found in the popliteal fossa. Left: There is no evidence of deep vein thrombosis in the lower extremity. However, portions of this examination were limited- see technologist comments above. No cystic structure found in the popliteal fossa.  *See table(s) above for measurements and observations. Electronically signed by Servando Snare MD on 04/19/2018 at 3:29:13 PM.    Final      Medical Consultants:    None.  Anti-Infectives:   None  Subjective:    [name] she relates her breathing is slightly better.   Objective:    Vitals:   04/20/18 1352 04/20/18 2112 04/20/18 2132 04/21/18 0555  BP: 128/88  139/82 125/75  Pulse: 85 77 71 87  Resp: 16 18 18 18   Temp: 99.5 F (37.5 C)  99.6 F (37.6 C) 99.4 F (37.4 C)  TempSrc: Oral  Oral Oral  SpO2: 96% 96% 98% 97%  Weight:    104.3 kg  Height:        Intake/Output Summary (Last 24 hours) at 04/21/2018 0940 Last data filed at 04/21/2018 0836 Gross per 24 hour  Intake 1140 ml  Output 1350 ml  Net -210 ml   Filed Weights   04/19/18 0438 04/20/18 0646 04/21/18 0555  Weight: 103.1 kg 103.6 kg 104.3 kg    Exam: General exam: In no acute distress. Respiratory system: Good air movement and clear to auscultation. Cardiovascular system: S1 & S2 heard, RRR. + JVD. Gastrointestinal system: Abdomen is nondistended, soft and nontender.  Central nervous system: Alert and oriented. No focal neurological deficits. Extremities: 3+ edema Skin: No rashes, lesions or ulcers Psychiatry: Judgement and insight appear normal. Mood & affect  appropriate.    Data Reviewed:    Labs: Basic Metabolic Panel: Recent Labs  Lab  04/16/18 0428 04/17/18 0414 04/18/18 0434 04/19/18 0413 04/20/18 0425  NA 146* 143 145 144 142  K 3.5 3.5 3.4* 3.5 4.0  CL 107 107 108 105 106  CO2 27 29 27 27 25   GLUCOSE 62* 117* 89 116* 100*  BUN 28* 26* 23 23 22   CREATININE 1.25* 1.23* 1.17* 1.39* 1.20*  CALCIUM 9.1 9.0 9.0 9.3 9.2  MG 1.7 2.1 1.8 1.7 1.8   GFR Estimated Creatinine Clearance: 51.1 mL/min (A) (by C-G formula based on SCr of 1.2 mg/dL (H)). Liver Function Tests: Recent Labs  Lab 04/18/18 0434  AST 24  ALT 19  ALKPHOS 74  BILITOT 1.2  PROT 6.2*  ALBUMIN 2.9*   No results for input(s): LIPASE, AMYLASE in the last 168 hours. No results for input(s): AMMONIA in the last 168 hours. Coagulation profile Recent Labs  Lab 04/15/18 0050  INR 2.39    CBC: Recent Labs  Lab 04/15/18 0050 04/16/18 0428 04/17/18 0414  WBC 13.5* 7.9 6.3  NEUTROABS  --  5.2 3.6  HGB 8.0* 8.6* 7.9*  HCT 28.2* 31.7* 28.7*  MCV 84.7 83.6 85.4  PLT 157 167 160   Cardiac Enzymes: Recent Labs  Lab 04/15/18 0050 04/15/18 0720 04/15/18 1319 04/15/18 1838  TROPONINI 0.05* 0.04* 0.05* 0.04*   BNP (last 3 results) No results for input(s): PROBNP in the last 8760 hours. CBG: Recent Labs  Lab 04/20/18 0742 04/20/18 1108 04/20/18 1701 04/20/18 2129 04/21/18 0816  GLUCAP 99 125* 105* 99 90   D-Dimer: No results for input(s): DDIMER in the last 72 hours. Hgb A1c: No results for input(s): HGBA1C in the last 72 hours. Lipid Profile: No results for input(s): CHOL, HDL, LDLCALC, TRIG, CHOLHDL, LDLDIRECT in the last 72 hours. Thyroid function studies: No results for input(s): TSH, T4TOTAL, T3FREE, THYROIDAB in the last 72 hours.  Invalid input(s): FREET3 Anemia work up: No results for input(s): VITAMINB12, FOLATE, FERRITIN, TIBC, IRON, RETICCTPCT in the last 72 hours. Sepsis Labs: Recent Labs  Lab 04/15/18 0050 04/16/18 0428  04/17/18 0414  WBC 13.5* 7.9 6.3   Microbiology No results found for this or any previous visit (from the past 240 hour(s)).   Medications:   . allopurinol  300 mg Oral BID  . apixaban  5 mg Oral BID  . atorvastatin  40 mg Oral Q1400  . colchicine  0.6 mg Oral QPM  . ezetimibe  10 mg Oral Q1400  . ferrous sulfate  325 mg Oral Q breakfast  . furosemide  80 mg Intravenous Daily  . gabapentin  100 mg Oral QHS  . hydrocerin   Topical BID  . insulin aspart  0-20 Units Subcutaneous TID WC  . insulin glargine  14 Units Subcutaneous Q supper  . isosorbide mononitrate  15 mg Oral Daily  . nebivolol  10 mg Oral Daily  . pantoprazole  40 mg Oral Daily  . potassium chloride  20 mEq Oral BID  . senna-docusate  1 tablet Oral BID  . traZODone  50 mg Oral QHS   Continuous Infusions:    LOS: 5 days   Charlynne Cousins  [facility]   *Please refer to [email or web address], password [email or web address] to get updated schedule on who will round on this patient, as hospitalists switch teams weekly. If 7PM-7AM, please contact night-coverage at [email or web address], password [email or web address] for any overnight needs.  04/21/2018, 9:40 AM

## 2018-04-21 NOTE — Progress Notes (Signed)
Physical Therapy Treatment Patient Details Name: Stephanie Franco MRN: [id number] DOB: Jan 05, 1951 Today's Date: 04/21/2018    History of Present Illness  67 year old female with history of permanent atrial fibrillation on Eliquis, morbid obesity, obstructive sleep apnea, secondary pulmonary hypertension, ejection fraction of approximately 40 to 45% on echocardiogram, prior coronary artery disease with PCI performed in 2006, Myoview in 2016-, chronic lower extremity edema, history of breast cancer with left mastectomy, diabetes with hypertension and hyperlipidemia here with worsening dyspnea on exertion with worsening lower extremity edema over the past several weeks.  Admitted for CHF exacerbation.    PT Comments    Pt continues to participate well.    Follow Up Recommendations  Home health PT;Supervision - Intermittent     Equipment Recommendations  None recommended by PT    Recommendations for Other Services       Precautions / Restrictions Precautions Precautions: Fall Restrictions Weight Bearing Restrictions: No    Mobility  Bed Mobility               General bed mobility comments: up in chair  Transfers Overall transfer level: Modified independent Equipment used: Rolling walker (2 wheeled) Transfers: Sit to/from Stand Sit to Stand: Modified independent (Device/Increase time)            Ambulation/Gait Ambulation/Gait assistance: Min guard Gait Distance (Feet): 225 Feet Assistive device: Rolling walker (2 wheeled) Gait Pattern/deviations: Step-through pattern;Decreased stride length     General Gait Details: close guard for safety. Pt tolerated distance well. Intermittent curing for posture.    Stairs             Wheelchair Mobility    Modified Rankin (Stroke Patients Only)       Balance                                            Cognition Arousal/Alertness: Awake/alert Behavior During Therapy: WFL for tasks  assessed/performed Overall Cognitive Status: Within Functional Limits for tasks assessed                                        Exercises General Exercises - Lower Extremity Hip ABduction/ADduction: AROM;Right;10 reps;Standing Hip Flexion/Marching: AROM;Both;10 reps;Standing Heel Raises: AROM;Both;10 reps;Standing    General Comments        Pertinent Vitals/Pain Pain Assessment: Faces Faces Pain Scale: Hurts little more Pain Location: lower back, R shoulder/neck and lower legs Pain Descriptors / Indicators: Aching;Sore Pain Intervention(s): Monitored during session;Repositioned    Home Living                      Prior Function            PT Goals (current goals can now be found in the care plan section) Progress towards PT goals: Progressing toward goals    Frequency    Min 3X/week      PT Plan Current plan remains appropriate    Co-evaluation              AM-PAC PT "6 Clicks" Mobility   Outcome Measure  Help needed turning from your back to your side while in a flat bed without using bedrails?: A Little Help needed moving from lying on your back to sitting on the side of a flat  bed without using bedrails?: A Little Help needed moving to and from a bed to a chair (including a wheelchair)?: A Little Help needed standing up from a chair using your arms (e.g., wheelchair or bedside chair)?: A Little Help needed to walk in hospital room?: A Little Help needed climbing 3-5 steps with a railing? : A Little 6 Click Score: 18    End of Session   Activity Tolerance: Patient tolerated treatment well Patient left: in chair;with call bell/phone within reach;with family/visitor present   PT Visit Diagnosis: Other abnormalities of gait and mobility (R26.89);Muscle weakness (generalized) (M62.81)     Time: 3568-6168 PT Time Calculation (min) (ACUTE ONLY): 11 min  Charges:  $Gait Training: 8-22 mins                        Weston Anna, PT Acute Rehabilitation Services Pager: [phone] Office: [phone]

## 2018-04-21 NOTE — Progress Notes (Signed)
Fluid intake 1140 cc thus far. Pain controlled. Vitals stable.

## 2018-04-22 ENCOUNTER — Ambulatory Visit: Payer: Medicare Other | Admitting: Cardiology

## 2018-04-22 DIAGNOSIS — E785 Hyperlipidemia, unspecified: Secondary | ICD-10-CM

## 2018-04-22 LAB — BASIC METABOLIC PANEL
Anion gap: 10 (ref 5–15)
BUN: 26 mg/dL — ABNORMAL HIGH (ref 8–23)
CO2: 26 mmol/L (ref 22–32)
Calcium: 9.3 mg/dL (ref 8.9–10.3)
Chloride: 105 mmol/L (ref 98–111)
Creatinine, Ser: 1.43 mg/dL — ABNORMAL HIGH (ref 0.44–1.00)
GFR calc non Af Amer: 37 mL/min — ABNORMAL LOW (ref >60)
GFR, EST AFRICAN AMERICAN: 43 mL/min — AB (ref >60)
Glucose, Bld: 86 mg/dL (ref 70–99)
Potassium: 4 mmol/L (ref 3.5–5.1)
Sodium: 141 mmol/L (ref 135–145)

## 2018-04-22 LAB — GLUCOSE, CAPILLARY
GLUCOSE-CAPILLARY: 79 mg/dL (ref 70–99)
Glucose-Capillary: 132 mg/dL — ABNORMAL HIGH (ref 70–99)

## 2018-04-22 MED ORDER — BLOOD GLUCOSE MONITOR KIT: PACK | 0 refills | Status: DC

## 2018-04-22 MED ORDER — FUROSEMIDE 80 MG PO TABS: 80.0000 mg | ORAL_TABLET | Freq: Two times a day (BID) | ORAL | 11 refills | Status: DC

## 2018-04-22 NOTE — Progress Notes (Signed)
Pt discharged to home, instruction reviewed, Heart failure booklet given and reviewed with patient. Prescriptions given to patient. Acknowledged understanding. SRP,RN

## 2018-04-22 NOTE — Progress Notes (Addendum)
Progress Note  Patient Name: Stephanie Franco Date of Encounter: 04/22/2018  Primary Cardiologist: Glenetta Hew, MD   Subjective   Feeling fine today. Out of bed sitting in chair. Legs elevated. Compression stockings on. No dyspnea.   Inpatient Medications    Scheduled Meds: . allopurinol  300 mg Oral BID  . apixaban  5 mg Oral BID  . atorvastatin  40 mg Oral Q1400  . colchicine  0.6 mg Oral QPM  . ezetimibe  10 mg Oral Q1400  . ferrous sulfate  325 mg Oral Q breakfast  . furosemide  80 mg Intravenous Daily  . gabapentin  100 mg Oral QHS  . hydrocerin   Topical BID  . insulin aspart  0-20 Units Subcutaneous TID WC  . insulin glargine  14 Units Subcutaneous Q supper  . isosorbide mononitrate  15 mg Oral Daily  . nebivolol  10 mg Oral Daily  . pantoprazole  40 mg Oral Daily  . potassium chloride  20 mEq Oral BID  . senna-docusate  1 tablet Oral BID  . traZODone  50 mg Oral QHS   Continuous Infusions:  PRN Meds: acetaminophen **OR** acetaminophen, diphenhydrAMINE, HYDROcodone-acetaminophen, ipratropium, levalbuterol, LORazepam, ondansetron **OR** ondansetron (ZOFRAN) IV   Vital Signs    Vitals:   04/21/18 1406 04/21/18 2150 04/22/18 0524 04/22/18 0628  BP: 132/88 119/73 124/79   Pulse: 79 74 83   Resp: 18 18 18    Temp: 98.6 F (37 C) 99.8 F (37.7 C) 98.8 F (37.1 C)   TempSrc: Oral Oral Oral   SpO2: 98% 94% 98%   Weight:    102 kg  Height:        Intake/Output Summary (Last 24 hours) at 04/22/2018 0755 Last data filed at 04/22/2018 0600 Gross per 24 hour  Intake 870 ml  Output 2250 ml  Net -1380 ml   Filed Weights   04/20/18 0646 04/21/18 0555 04/22/18 0628  Weight: 103.6 kg 104.3 kg 102 kg    Telemetry    Atrial fibrillation 80s-90s, 5 beat run of NSVT- Personally Reviewed  ECG    Not performed today - Personally Reviewed  Physical Exam   GEN: No acute distress.   Neck: No JVD Cardiac: irregularly irregular rhythm, regular  rate Respiratory: Clear to auscultation bilaterally. GI: Soft, nontender, non-distended  MS: trace bilateral LEE, wearing compression stockings Neuro:  Nonfocal  Psych: Normal affect   Labs    Chemistry Recent Labs  Lab 04/18/18 0434  04/20/18 0425 04/21/18 0956 04/22/18 0529  NA 145   < > 142 142 141  K 3.4*   < > 4.0 3.9 4.0  CL 108   < > 106 104 105  CO2 27   < > 25 24 26   GLUCOSE 89   < > 100* 150* 86  BUN 23   < > 22 25* 26*  CREATININE 1.17*   < > 1.20* 1.40* 1.43*  CALCIUM 9.0   < > 9.2 9.4 9.3  PROT 6.2*  --   --   --   --   ALBUMIN 2.9*  --   --   --   --   AST 24  --   --   --   --   ALT 19  --   --   --   --   ALKPHOS 74  --   --   --   --   BILITOT 1.2  --   --   --   --  GFRNONAA 48*   < > 46* 38* 37*  GFRAA 55*   < > 53* 44* 43*  ANIONGAP 10   < > 11 14 10    < > = values in this interval not displayed.     Hematology Recent Labs  Lab 04/16/18 0428 04/17/18 0414 04/21/18 1350  WBC 7.9 6.3 5.7  RBC 3.79* 3.36*  3.36* 3.80*  HGB 8.6* 7.9* 8.9*  HCT 31.7* 28.7* 32.0*  MCV 83.6 85.4 84.2  MCH 22.7* 23.5* 23.4*  MCHC 27.1* 27.5* 27.8*  RDW 19.4* 19.4* 19.9*  PLT 167 160 244    Cardiac Enzymes Recent Labs  Lab 04/15/18 1319 04/15/18 1838  TROPONINI 0.05* 0.04*   No results for input(s): TROPIPOC in the last 168 hours.   BNPNo results for input(s): BNP, PROBNP in the last 168 hours.   DDimer No results for input(s): DDIMER in the last 168 hours.   Radiology    No results found.  Cardiac Studies   Echocardiogram 04/15/2018 Study Conclusions - Left ventricle: The cavity size was normal. Wall thickness was increased in a pattern of moderate LVH. Systolic function was mildly to moderately reduced. The estimated ejection fraction was in the range of 40% to 45%. Diffuse hypokinesis. The study is not technically sufficient to allow evaluation of LV diastolic function. LV filling pressure is elevated. - Mitral valve: Mildly  thickened leaflets . There was moderate posterior and laterally directed regurgitation. - Left atrium: Massively dilated. - Right ventricle: The cavity size was moderately dilated. Mildly reduced systolic function. - Right atrium: Severely dilated. - Tricuspid valve: There was moderate regurgitation. - Pulmonary arteries: PA peak pressure: 66 mm Hg (S). - Inferior vena cava: The vessel was dilated. The respirophasic diameter changes were blunted (<50%), consistent with elevated central venous pressure.  Impressions: - Compared to a prior study in 12/2017, the LVEF is lower at 40-45%  Patient Profile     67 y.o.femalehere with worsening lower extremity edema, shortness of breath consistent with acute systolic and diastolic heart failure with permanent atrial fibrillation on Eliquis, prior CAD stable.  Assessment & Plan    1. 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 -Echo this admit showed mild-moderately  reduced EF at 40- 45%, similar to echo in 01/2015. Moderate pulmonary hypertension also previously noted. -Treating w/ IV Lasix. Good UOP yesterday w/ 2.2L out.  - Symptoms improved. Feeling better. Dyspnea resolved. Still with mild LEE, continue compression stockings. - Renal function and K stable. - Transition to PO Lasix today.  - daily weights, low sodium diet and strict med compliance encouraged at discharge  2. Acute kidney injury -Creatinine over the last year has ranged between 1.1 and 1.36. Creatinine had been improving from 1.76>>1.17 and then bumped up today to 1.39 on 12/3. Diureticsheld temporarily and Cr improved to 1.20. Lasix resumed 12/4 at 80 mg IV BID. SCr back up to 1.40 but stable over the last 48 hrs, 1.43 today. Once discharge, she will need a f/u office BMP for further monitoring.    3. NSVTvs afib with abberancy -Patient is in atrial  fibrillation and had severalshortruns ofWCTon monitor. Potassium is normal at 4.0. Magnesium was low normal yesterday at 1.8. Supplemental magnesium 2 g IV was given. she had another 5 beat run on tele. Continue  blocker  4. Leg pain -No overt DVT noted on lower extremity ultrasound however exam was limited.Pain possibly related to taut edema. -  Can consider vascular evaluation if her pain continues after improvement in edema,outpatient.  5. Mildly elevated troponin -Consistent with demand ischemia in the setting of heart failure exacerbation.Patient has had no chest pain or discomfort. No plans for ischemic evaluation this admission.   6. CAD -s/p PCI in 2006, last cardiac catheterization in 2013 showed patent stent, subsequent Myoview was negative in 2016. Not on aspirin or Plavix due to need for anticoagulation with Eliquis. -Currently stable with no angina. Continue current regimen. On ? blocker and statin. ACEi on hold for AKI.   7. Hyperlipidemia -On atorvastatin and Zetia LDL was 61 and 05/2017 at goal LDL<70. Continue regimen.   8. Permanent atrial fibrillation -CHA2DS2/VAS Stroke RiskScore6 (CHF,vasc dz,HTN, age, DM, female)on anticoagulation with Eliquis for stroke risk reduction. No bleeding issues. -Rate controlledon Bystolic. HR in the 80s.   9. Hypertension -Blood pressure currently well controlled. 124/79 this morning.  -Home lisinopril is currently on hold due to AKI. Resume once renal function stable.  10. Morbid obesity -Body mass index is 40.26 kg/m.Continue to encourage weight loss  11. Obstructive sleep apnea -Continue CPAP.Encourage weight loss.  12. Pulmonary hypertension -OSA and obesity likely a key factor. -Continue blood pressure control, diuresis, CPAP and encourage weight loss.  13. Diabetes - A1c of 6.1 in 12/2017.On SSI while hospitalized. Management per primary.  For questions or updates, please contact [facility] Please consult [email or web address] for contact info under        Signed, Lyda Jester, PA-C  04/22/2018, 7:55 AM    PT seen and examined   I agree with dingis ast noted above by B Simmons    Pt denies SOB Lungs are CTA Cardiac RRR   No S3 Ext with 1+ edema  (about baseline )  I think it is OK to dc pt today   She has been comfortable Discussed low Na diet   2L fluid Daily am wts Will make sure she has outpt f/u  Dorris Carnes

## 2018-04-22 NOTE — Discharge Summary (Addendum)
Physician Discharge Summary  Stephanie Franco:[id number] DOB: 1950-11-26 DOA: 04/14/2018  PCP: Lin Landsman, MD  Admit date: 04/14/2018 Discharge date: 04/22/2018  Admitted From: home Disposition:  Home  Recommendations for Outpatient Follow-up:  1. Follow up with Cardiology in 1-2 weeks 2. Please obtain BMP/CBC in one week  Home Health:No Equipment/Devices:none  Discharge Condition:stable CODE STATUS:full Diet recommendation: Heart Healthy   Brief/Interim Summary: 67 y.o. female medical history of permanent atrial fibrillation on Eliquis, shortly sleep apnea, secondary pulmonary hypertension, with an EF of 40%, with history of coronary artery disease, chronic lower extremity edema, hospitalized in September for influenza and bacteremia comes to the ED with worsening lower extremity edema and dyspnea  Discharge Diagnoses:  Active Problems:   Essential hypertension   Hyperlipidemia with target LDL less than 70   Atrial fibrillation, permanent (HCC); CHA2DS2Vasc - 6   Pulmonary HTN (HCC)   GERD (gastroesophageal reflux disease)   Gout   Hypomagnesemia   Elevated troponin   AKI (acute kidney injury) ([place])   Acute on chronic combined systolic and diastolic CHF (congestive heart failure) (HCC) Acute on chronic combined systolic and diastolic heart failure/acute cardiorenal syndrome: Estimated dry weight around 102 kg. Lower extremity Doppler was negative for DVT, cardiology was consulted she was started on IV fluids and she diuresed well. Lisinopril was held on admission her Imdur dose was decreased due to low blood pressure. She will go back on her lisinopril, she will also go home on Lasix 80 mg bid. She will follow-up with cardiology in 1 week check a basic metabolic panel and stressed compliance with medication.  Electrolyte imbalance hypokalemia/hypomagnesemia: Repleted orally resolved.  Chronic atrial fibrillation: Rate controlled continue Eliquis.  Acute on  chronic kidney disease stage III: Creatinine improved with diuresis she is now back to baseline.  Insulin-dependent diabetes mellitus type 2: With an A1c of 6.2 no changes were made to her medication.  Essential hypertension: Continue Imdur, Lasix and bisystolic  Hyperlipidemia: Continue Lipitor and Zetia.  Anemia of chronic disease: Hemoglobin has remained stable on admission continue follow-up with nephrology as an outpatient.  Gout: No changes were made to her medication. Discharge Instructions  Discharge Instructions    Diet - low sodium heart healthy   Complete by:  As directed    Increase activity slowly   Complete by:  As directed      Allergies as of 04/22/2018   No Known Allergies     Medication List    STOP taking these medications   zolpidem 10 MG tablet Commonly known as:  AMBIEN     TAKE these medications   ACCU-CHEK SMARTVIEW test strip Generic drug:  glucose blood 1 each by Other route as needed for other. Use as instructed   albuterol 108 (90 Base) MCG/ACT inhaler Commonly known as:  PROVENTIL HFA;VENTOLIN HFA Inhale 1-2 puffs into the lungs every 6 (six) hours as needed for wheezing or shortness of breath.   albuterol (2.5 MG/3ML) 0.083% nebulizer solution Commonly known as:  PROVENTIL Take 3 mLs (2.5 mg total) by nebulization every 6 (six) hours as needed for wheezing or shortness of breath.   allopurinol 300 MG tablet Commonly known as:  ZYLOPRIM Take 300 mg by mouth 2 (two) times daily.   atorvastatin 40 MG tablet Commonly known as:  LIPITOR Take 1 tablet (40 mg total) by mouth daily. What changed:    when to take this  additional instructions   blood glucose meter kit and supplies Kit Dispense based  on patient and insurance preference. Use up to four times daily as directed. (FOR ICD-9 250.00, 250.01).   colchicine 0.6 MG tablet Take 0.6 mg by mouth every evening.   docusate sodium 100 MG capsule Commonly known as:   COLACE Take 1 capsule (100 mg total) by mouth 2 (two) times daily.   ELIQUIS 5 MG Tabs tablet Generic drug:  apixaban TAKE 1 TABLET BY MOUTH TWICE A DAY What changed:  how much to take   ezetimibe 10 MG tablet Commonly known as:  ZETIA Take 1 tablet (10 mg total) by mouth daily. What changed:  when to take this   ferrous sulfate 325 (65 FE) MG EC tablet Take 325 mg by mouth daily with breakfast.   fexofenadine 180 MG tablet Commonly known as:  ALLEGRA Take 180 mg by mouth daily as needed for allergies.   fluticasone 50 MCG/ACT nasal spray Commonly known as:  FLONASE Place 2 sprays into the nose daily as needed for allergies or rhinitis.   furosemide 80 MG tablet Commonly known as:  LASIX Take 1 tablet (80 mg total) by mouth 2 (two) times daily. What changed:  additional instructions   HYDROcodone-acetaminophen 5-325 MG tablet Commonly known as:  NORCO/VICODIN Take 1-2 tablets by mouth every 6 (six) hours as needed (postop hip pain).   isosorbide mononitrate 60 MG 24 hr tablet Commonly known as:  IMDUR Take 1 tablet (60 mg total) by mouth daily.   lisinopril 40 MG tablet Commonly known as:  PRINIVIL,ZESTRIL Take 1 tablet (40 mg total) by mouth daily.   LORazepam 1 MG tablet Commonly known as:  ATIVAN Take 1 mg by mouth 2 (two) times daily. In the evening with supper & at bedtime   multivitamin with minerals tablet Take 1 tablet by mouth daily.   nebivolol 10 MG tablet Commonly known as:  BYSTOLIC Take 1 tablet (10 mg total) by mouth daily.   nitroGLYCERIN 0.4 MG SL tablet Commonly known as:  NITROSTAT DISSOLVE 1 TABLET (0.4 MG TOTAL) UNDER THE TONGUE EVERY 5 (FIVE) MINUTES AS NEEDED FOR CHEST PAIN.   ondansetron 4 MG tablet Commonly known as:  ZOFRAN Take 1 tablet (4 mg total) by mouth every 6 (six) hours as needed for nausea.   pantoprazole 40 MG tablet Commonly known as:  PROTONIX Take 1 tablet (40 mg total) by mouth daily.   potassium chloride SA 20  MEQ tablet Commonly known as:  K-DUR,KLOR-CON Take 1 tablet (20 mEq total) by mouth 2 (two) times daily. What changed:  additional instructions   senna 8.6 MG Tabs tablet Commonly known as:  SENOKOT Take 1 tablet (8.6 mg total) by mouth 2 (two) times daily.   TOUJEO SOLOSTAR 300 UNIT/ML Sopn Generic drug:  Insulin Glargine Inject 28 Units into the skin daily with supper.      [place] Follow up.   Specialty:  Home Health Services Why:  local number [phone] Home Health RN and Physical Therapy -agency will call to arrange initial appointment Contact information: [place] [address] [phone]        Lendon Colonel, NP Follow up.   Specialties:  Nurse Practitioner, Radiology, Cardiology Why:  Cardiology hospital follow-up on 04/28/2018 at 9:00 AM.  Please arrive 15 minutes early for check-in. Contact information: [address] [place] [address] [phone]          No Known Allergies  Consultations:  Cardiology   Procedures/Studies: Dg Chest 2 View  Result Date: 04/15/2018 CLINICAL DATA:  Initial evaluation for extremity swelling, shortness of breath. EXAM: CHEST - 2 VIEW COMPARISON:  Prior radiograph from 02/20/2017. FINDINGS: Moderate to severe cardiomegaly, similar to previous. Mediastinal silhouette normal. Aortic atherosclerosis. Calcified left hilar adenopathy noted, stable. Lungs mildly hypoinflated. Perihilar vascular congestion without overt pulmonary edema. No focal infiltrates. No pleural effusion. No pneumothorax. No acute osseous abnormality. IMPRESSION: 1. Cardiomegaly with perihilar vascular congestion without frank pulmonary edema. 2. Aortic atherosclerosis. Electronically Signed   By: Jeannine Boga M.D.   On: 04/15/2018 01:16   Vas [place] Lower Extremity Venous (dvt)  Result Date: 04/19/2018  Lower Venous Study Indications: Edema.   Limitations: Body habitus and poor ultrasound/tissue interface. Performing Technologist: Oliver Hum RVT  Examination Guidelines: A complete evaluation includes B-mode imaging, spectral Doppler, color Doppler, and power Doppler as needed of all accessible portions of each vessel. Bilateral testing is considered an integral part of a complete examination. Limited examinations for reoccurring indications may be performed as noted.  Right Venous Findings: +---------+---------------+---------+-----------+----------+-------+          CompressibilityPhasicitySpontaneityPropertiesSummary +---------+---------------+---------+-----------+----------+-------+ CFV      Full           Yes      Yes                          +---------+---------------+---------+-----------+----------+-------+ SFJ      Full                                                 +---------+---------------+---------+-----------+----------+-------+ FV Prox  Full                                                 +---------+---------------+---------+-----------+----------+-------+ FV Mid   Full                                                 +---------+---------------+---------+-----------+----------+-------+ FV Distal               Yes      Yes                          +---------+---------------+---------+-----------+----------+-------+ PFV      Full                                                 +---------+---------------+---------+-----------+----------+-------+ POP      Full           Yes      Yes                          +---------+---------------+---------+-----------+----------+-------+ PTV      Full                                                 +---------+---------------+---------+-----------+----------+-------+  PERO     Full                                                 +---------+---------------+---------+-----------+----------+-------+  Left Venous Findings:  +---------+---------------+---------+-----------+----------+-------+          CompressibilityPhasicitySpontaneityPropertiesSummary +---------+---------------+---------+-----------+----------+-------+ CFV      Full           Yes      Yes                          +---------+---------------+---------+-----------+----------+-------+ SFJ      Full                                                 +---------+---------------+---------+-----------+----------+-------+ FV Prox  Full                                                 +---------+---------------+---------+-----------+----------+-------+ FV Mid   Full                                                 +---------+---------------+---------+-----------+----------+-------+ FV Distal               Yes      Yes                          +---------+---------------+---------+-----------+----------+-------+ PFV      Full                                                 +---------+---------------+---------+-----------+----------+-------+ POP      Full           Yes      Yes                          +---------+---------------+---------+-----------+----------+-------+ PTV      Full                                                 +---------+---------------+---------+-----------+----------+-------+ PERO     Full                                                 +---------+---------------+---------+-----------+----------+-------+    Summary: Right: There is no evidence of deep vein thrombosis in the lower extremity. However, portions of this examination were limited- see technologist comments above. No cystic structure found in the popliteal fossa. Left: There is no evidence of deep vein thrombosis in the lower extremity.  However, portions of this examination were limited- see technologist comments above. No cystic structure found in the popliteal fossa.  *See table(s) above for measurements and observations.  Electronically signed by Servando Snare MD on 04/19/2018 at 3:29:13 PM.    Final     Subjective: She relates she feels better than on admission.  Discharge Exam: Vitals:   04/22/18 0524 04/22/18 1425  BP: 124/79 124/79  Pulse: 83 83  Resp: 18 16  Temp: 98.8 F (37.1 C) 99.1 F (37.3 C)  SpO2: 98% 98%   Vitals:   04/21/18 2150 04/22/18 0524 04/22/18 0628 04/22/18 1425  BP: 119/73 124/79  124/79  Pulse: 74 83  83  Resp: 18 18  16   Temp: 99.8 F (37.7 C) 98.8 F (37.1 C)  99.1 F (37.3 C)  TempSrc: Oral Oral  Oral  SpO2: 94% 98%  98%  Weight:   102 kg   Height:        General: Pt is alert, awake, not in acute distress Cardiovascular: RRR, S1/S2 +, no rubs, no gallops Respiratory: CTA bilaterally, no wheezing, no rhonchi Abdominal: Soft, NT, ND, bowel sounds + Extremities: no edema, no cyanosis    The results of significant diagnostics from this hospitalization (including imaging, microbiology, ancillary and laboratory) are listed below for reference.     Microbiology: No results found for this or any previous visit (from the past 240 hour(s)).   Labs: BNP (last 3 results) Recent Labs    04/15/18 0050  BNP 884.1*   Basic Metabolic Panel: Recent Labs  Lab 04/16/18 0428 04/17/18 0414 04/18/18 0434 04/19/18 0413 04/20/18 0425 04/21/18 0956 04/22/18 0529  NA 146* 143 145 144 142 142 141  K 3.5 3.5 3.4* 3.5 4.0 3.9 4.0  CL 107 107 108 105 106 104 105  CO2 27 29 27 27 25 24 26   GLUCOSE 62* 117* 89 116* 100* 150* 86  BUN 28* 26* 23 23 22  25* 26*  CREATININE 1.25* 1.23* 1.17* 1.39* 1.20* 1.40* 1.43*  CALCIUM 9.1 9.0 9.0 9.3 9.2 9.4 9.3  MG 1.7 2.1 1.8 1.7 1.8  --   --    Liver Function Tests: Recent Labs  Lab 04/18/18 0434  AST 24  ALT 19  ALKPHOS 74  BILITOT 1.2  PROT 6.2*  ALBUMIN 2.9*   No results for input(s): LIPASE, AMYLASE in the last 168 hours. No results for input(s): AMMONIA in the last 168 hours. CBC: Recent Labs  Lab 04/16/18 0428  04/17/18 0414 04/21/18 1350  WBC 7.9 6.3 5.7  NEUTROABS 5.2 3.6  --   HGB 8.6* 7.9* 8.9*  HCT 31.7* 28.7* 32.0*  MCV 83.6 85.4 84.2  PLT 167 160 244   Cardiac Enzymes: Recent Labs  Lab 04/15/18 1838  TROPONINI 0.04*   BNP: Invalid input(s): POCBNP CBG: Recent Labs  Lab 04/21/18 1212 04/21/18 1731 04/21/18 2146 04/22/18 0733 04/22/18 1125  GLUCAP 138* 124* 137* 79 132*   D-Dimer No results for input(s): DDIMER in the last 72 hours. Hgb A1c No results for input(s): HGBA1C in the last 72 hours. Lipid Profile No results for input(s): CHOL, HDL, LDLCALC, TRIG, CHOLHDL, LDLDIRECT in the last 72 hours. Thyroid function studies No results for input(s): TSH, T4TOTAL, T3FREE, THYROIDAB in the last 72 hours.  Invalid input(s): FREET3 Anemia work up No results for input(s): VITAMINB12, FOLATE, FERRITIN, TIBC, IRON, RETICCTPCT in the last 72 hours. Urinalysis    Component Value Date/Time   COLORURINE YELLOW 04/15/2018 1423  APPEARANCEUR CLEAR 04/15/2018 1423   LABSPEC 1.010 04/15/2018 1423   PHURINE 6.0 04/15/2018 1423   GLUCOSEU NEGATIVE 04/15/2018 1423   HGBUR NEGATIVE 04/15/2018 [place] 04/15/2018 [place] 04/15/2018 [place] 04/15/2018 1423   UROBILINOGEN 1.0 10/28/2014 1440   NITRITE NEGATIVE 04/15/2018 1423   LEUKOCYTESUR TRACE (A) 04/15/2018 1423   Sepsis Labs Invalid input(s): PROCALCITONIN,  WBC,  LACTICIDVEN Microbiology No results found for this or any previous visit (from the past 240 hour(s)).   Time coordinating discharge: 40 minutes  SIGNED:   Charlynne Cousins, MD  [facility] 04/22/2018, 5:09 PM Pager   If 7PM-7AM, please contact night-coverage [email or web address] Password [email or web address]

## 2018-04-26 ENCOUNTER — Ambulatory Visit: Payer: Medicare Other | Admitting: Cardiology

## 2018-04-26 ENCOUNTER — Telehealth: Payer: Self-pay | Admitting: Cardiology

## 2018-04-26 NOTE — Telephone Encounter (Signed)
REVIEWED WITH DR HARDING.  PER DR HARDING , VERBAL ORDER GIVEN TO HAVE PHYSICAL THERAPY  FOR GAIT, BALANCE, AND ENDURANCE.   NOTIFIED  [facility] SHE  VERBALIZED UNDERSTANDING.

## 2018-04-26 NOTE — Telephone Encounter (Signed)
Should be PCP Order.  Glenetta Hew, MD

## 2018-04-26 NOTE — Telephone Encounter (Signed)
Routed to MD/RN 

## 2018-04-26 NOTE — Telephone Encounter (Signed)
Notified Stephanie Franco of MD request to send order to PCP. Patient has NO PCP - Stephanie Landsman MD (listed) is not current PCP. She states this request is in relation to her recent hospitalization for CHF. Will defer back to MD

## 2018-04-26 NOTE — Telephone Encounter (Signed)
  Tatiana from [facility] would like to get a verbal order from Dr Ellyn Hack for the patient to receive physical therapy for gait, balance and endurance. Okay to leave voicemail

## 2018-04-28 ENCOUNTER — Ambulatory Visit: Payer: Medicare Other | Admitting: Adult Health

## 2018-05-02 ENCOUNTER — Encounter: Payer: Self-pay | Admitting: Cardiology

## 2018-05-02 ENCOUNTER — Ambulatory Visit (INDEPENDENT_AMBULATORY_CARE_PROVIDER_SITE_OTHER): Payer: Medicare Other | Admitting: Cardiology

## 2018-05-02 VITALS — BP 121/85 | Ht 63.0 in | Wt 228.6 lb

## 2018-05-02 DIAGNOSIS — Z9861 Coronary angioplasty status: Secondary | ICD-10-CM

## 2018-05-02 DIAGNOSIS — L299 Pruritus, unspecified: Secondary | ICD-10-CM | POA: Insufficient documentation

## 2018-05-02 DIAGNOSIS — E785 Hyperlipidemia, unspecified: Secondary | ICD-10-CM

## 2018-05-02 DIAGNOSIS — I5042 Chronic combined systolic (congestive) and diastolic (congestive) heart failure: Secondary | ICD-10-CM

## 2018-05-02 DIAGNOSIS — R600 Localized edema: Secondary | ICD-10-CM | POA: Diagnosis not present

## 2018-05-02 DIAGNOSIS — I2510 Atherosclerotic heart disease of native coronary artery without angina pectoris: Secondary | ICD-10-CM | POA: Diagnosis not present

## 2018-05-02 DIAGNOSIS — I272 Pulmonary hypertension, unspecified: Secondary | ICD-10-CM | POA: Diagnosis not present

## 2018-05-02 DIAGNOSIS — I10 Essential (primary) hypertension: Secondary | ICD-10-CM

## 2018-05-02 DIAGNOSIS — I4821 Permanent atrial fibrillation: Secondary | ICD-10-CM | POA: Diagnosis not present

## 2018-05-02 DIAGNOSIS — I5043 Acute on chronic combined systolic (congestive) and diastolic (congestive) heart failure: Secondary | ICD-10-CM

## 2018-05-02 MED ORDER — PREDNISONE 20 MG PO TABS: ORAL_TABLET | ORAL | 0 refills | Status: DC

## 2018-05-02 MED ORDER — TRIAMCINOLONE ACETONIDE 0.5 % EX OINT: TOPICAL_OINTMENT | CUTANEOUS | 0 refills | Status: DC

## 2018-05-02 MED ORDER — HYDROXYZINE HCL 25 MG PO TABS: ORAL_TABLET | ORAL | 0 refills | Status: DC

## 2018-05-02 NOTE — Patient Instructions (Signed)
Medication Instructions:  START prednisone-take 60 mg daily x 2 days, then 40 mg daily x 2 days, then 20 mg daily x 2 days  Take hydroxyzine (Atarax) 1-2 tablets at bedtime as needed for itching/sleep  Apply triamcinolone cream to lower legs 2 times daily for 2 weeks  If you need a refill on your cardiac medications before your next appointment, please call your pharmacy.   Follow-Up: At [facility], you and your health needs are our priority.  As part of our continuing mission to provide you with exceptional heart care, we have created designated Provider Care Teams.  These Care Teams include your primary Cardiologist (physician) and Advanced Practice Providers (APPs -  Physician Assistants and Nurse Practitioners) who all work together to provide you with the care you need, when you need it. You will need a follow up appointment in 1 months.  Please call our office 2 months in advance to schedule this appointment.  You may see Glenetta Hew, MD or one of the following Advanced Practice Providers on your designated Care Team:   Rosaria Ferries, PA-C . Jory Sims, DNP, ANP

## 2018-05-02 NOTE — Progress Notes (Signed)
PCP: Lin Landsman, MD  Clinic Note: Chief Complaint  Patient presents with  . Hospitalization Follow-up    Admitted for acute on chronic combined CHF.  IV diuresis, but still feeling poorly.  . Edema    Associated leg itching.  . Atrial Fibrillation    Persistent.  No notable changes  . Congestive Heart Failure    Weight is up and swelling is up    HPI: Stephanie Franco is a 67 y.o. female with a PMH below who presents today for hospital follow-up for acute on chronic combined heart failure (mostly complication from travel revolving around the death of her son) She has had any history of Cardiomyopathy (EF roughly 45%, but was previously down to 25 -- presumably combination of both ischemic and nonischemic ) & PAF.  Last stress test was in 2016 when she had onset of A. fib. No evidence of ischemia or infarction.  Echo September 2016: EF was 40-45% with moderate pulmonary hypertension. Wall motion abnormality not explained by her known anatomy  Stephanie Franco was last seen by me in clinic on January 06, 2018 as part of preop evaluation for her hip surgery.  At that time, she was doing well.  Was excited to having the surgery done.  She had lost a lot of weight.  Was in great spirits.  As usual, was in stable A. fib.  No heart failure or angina symptoms.  We did order an echocardiogram to ensure that nothing was stable (reviewed below).  Recent Hospitalizations:   September 4-5, 2019: Right hip arthroplasty.  Uncomplicated postop course  She was admitted to hospital in [place] earlier in November for weakness and ended up being found to have the flu resulting in bacteremia.  She spent 8 days there and IV antibiotics at least 1 day in the ICU.  Never really recovered from that leading up to the Thanksgiving day admission.  04/14/2018-04/21/2018: Admitted for worsening edema and dyspnea.  Treated with IV Lasix and diuresed.  Then decreased to p.o. 80 mg twice daily Lasix.  She  indicated noting significant lower extremity itching since the hospitalization, but nothing was done about it.  Her weight is still up from her baseline.  Was noted to have several short runs of wide-complex tachycardia on telemetry.  Not symptomatic.  She did have some acute renal failure prior to diuresis.  Studies Personally Reviewed - (if available, images/films reviewed: From Epic Chart or Care Everywhere)  Transthoracic Echo 12/17/2017: EF 45-50%. Mod LAD Dilation. Severe RA dilation.  PAP ~60 mmHg    Transthoracic Echo 04/15/2018: Mod LVH EF 40-45%.  Diffuse HK.  Unable to assess Diastolic Fxn.  Severe R& L Atrial enlargement.  Mod RV dilation.  PAP ~ 66 mmHg.   Somewhere along the line she was switched from an ARB to lisinopril.  I do not know where or how this happened.  She had been on Avapro.  Interval History: Stephanie Franco presents here today somewhat exasperated.  She just seems to be distraught, fatigued.  She is not sleeping well.  She has profound edema and dyspnea.  She has PND and orthopnea now. Basically she did well after her hip surgery, but then about a month, they got the word that their oldest son died of cardiac arrest after 10 minutes of CPR they were not able to really get resuscitation.  He lived in [place], and they had to go back and forth several times preparing for the funeral.  The travel  caused her to get off of her usual routine of Lasix and she therefore put on a significant medical weight.  She was therefore made to the hospital.  During her hospital stay she started having extensive itching in her legs but nothing really was done about it.  She is really now here only complaining of the leg itching.  She has tense edema that seems more than she usually has (and more than was described on exam on discharge).  Her weight is also back up. She is in a wheelchair to simply because she is so tired and worn out that she is not even been walking very much she is not sleeping  well.  A lot of this has to do with the social stress of having lost her son.  She is still grieving tremendously.  In fact on the night that they found out, she actually was taken to the emergency room because of profound grieving. She denies any chest pain with rest or exertion.  She does not really have that much exertional dyspnea, because she not really been exerting.  She probably what is she want to try.  Mild orthopnea but mostly edema.  No real PND. She is not had any syncope or near syncope.  She does not feel that she is in A. fib with any rapid irregular heartbeats or palpitations.  No TIA/amaurosis fugax symptoms. No melena, hematochezia, hematuria, or epstaxis. No claudication.  ROS: A comprehensive was performed. Review of Systems  Constitutional: Positive for chills and malaise/fatigue.  HENT: Negative for ear discharge and nosebleeds.   Respiratory: Positive for shortness of breath.   Cardiovascular: Positive for leg swelling.  Gastrointestinal: Positive for heartburn. Negative for blood in stool, melena, nausea and vomiting.  Genitourinary: Negative for hematuria.  Musculoskeletal: Positive for joint pain (Her hip is doing great). Negative for falls.  Skin: Positive for itching (Profound itching from the thighs down bilaterally.  Is driving her crazy and keeping her from sleeping.).  Neurological: Positive for weakness (General body ache, especially leg weakness).  Psychiatric/Behavioral: The patient has insomnia (Partly because of the itching, and partly because of her grieving).        She seems to be very distraught and upset still with the loss of her son.  All other systems reviewed and are negative.   I have reviewed and (if needed) personally updated the patient's problem list, medications, allergies, past medical and surgical history, social and family history.   Past Medical History:  Diagnosis Date  . Aortic atherosclerosis ([place])   . Arthritis   . Asthma   .  Atrial fibrillation, permanent    Rate control with Bystolic. CHA2DS2Vasc = 6 (HTN, DM, CHF, Age 43, Female) -> on Pradaxa  . Breast cancer ([place]) 2008   S/P mastectomy  . CAD S/P percutaneous coronary angioplasty 2006;    PCI of circumflex with Taxus DES;; relook-cath Feb 2013: 50-60% short lesion in RCA, 40% ISR Circumflex stent.  . Cardiomegaly   . CKD (chronic kidney disease), stage III ([place])   . Complication of anesthesia    prolonged sedation after colonoscopy in [place]  . Diabetes mellitus, uncontrolled 07/14/2011  . Dilated cardiomyopathy ([place])    Mostly Resolved (EF up from ~25% to ~45% by Echo)  . DOE (dyspnea on exertion)   . Edema of both legs    Usually mild, chronic. Controlled with when necessary furosemide and diet  . 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  GERD (gastroesophageal reflux disease)   . Gout   . Hyperlipidemia   . Hypertension   . Hypokalemia   . Morbid obesity (HCC)    BMI 41  . OSA on CPAP    On CPAP  . Pulmonary hypertension, unspecified ([place])    PAP ~90 mmHg on Echo 12/2017 -- has OSA on CPAP & obesity - but Overall Improved Sx of dyspnea / edema  . Systolic murmur     Past Surgical History:  Procedure Laterality Date  . COLONOSCOPY    . CORONARY ANGIOPLASTY WITH STENT PLACEMENT  2006   stent to circumflex  . DILATION AND CURETTAGE OF UTERUS    . LEFT HEART CATHETERIZATION WITH CORONARY ANGIOGRAM N/A 07/13/2011   Procedure: LEFT HEART CATHETERIZATION WITH CORONARY ANGIOGRAM;  Surgeon: Leonie Man, MD;  Location: [facility] CATH LAB;  Service: Cardiovascular;  normal EF; 9m 50-60% lesion in RCA; patent stent in circumflex form 2006 with ~40% in-stent re-stenosis  . MASTECTOMY  2008   left  . NM MYOVIEW LTD  11/22/2014   Low Risk. No ischemia or infarction. Not gated 2/2  A. fib - unable to assess EF  . TOTAL HIP ARTHROPLASTY Right 01/19/2018   Procedure: RIGHT TOTAL HIP ARTHROPLASTY ANTERIOR APPROACH;  Surgeon: SRod Can MD;  Location: [facility]  ORS;  Service: Orthopedics;  Laterality: Right;  Needs RNFA  . TRANSTHORACIC ECHOCARDIOGRAM  01/2015   LV EF 40-45%, Mild Anteroseptal HK. Mod-Severe  RA dilation with elevated PA Pressures (~~peak 63 mmHg).. Mild LA dilation  . TRANSTHORACIC ECHOCARDIOGRAM  June 2015   In setting of Urosepsis: EF ~25 %, global HK (poor quality study)  . TRANSTHORACIC ECHOCARDIOGRAM  12/2017   Improved LVEF to 45 to 50%.  Pulmonary hypertension estimated PA pressures of 90 mmHg. Severe LA dilation& mild /rv dilation.    Current Meds  Medication Sig  . albuterol (PROVENTIL HFA;VENTOLIN HFA) 108 (90 Base) MCG/ACT inhaler Inhale 1-2 puffs into the lungs every 6 (six) hours as needed for wheezing or shortness of breath. (Patient not taking: Reported on 05/04/2018)  . albuterol (PROVENTIL) (2.5 MG/3ML) 0.083% nebulizer solution Take 3 mLs (2.5 mg total) by nebulization every 6 (six) hours as needed for wheezing or shortness of breath.  . allopurinol (ZYLOPRIM) 300 MG tablet Take 300 mg by mouth 2 (two) times daily.   .Marland Kitchenatorvastatin (LIPITOR) 40 MG tablet Take 1 tablet (40 mg total) by mouth daily.  . blood glucose meter kit and supplies KIT Dispense based on patient and insurance preference. Use up to four times daily as directed. (FOR ICD-9 250.00, 250.01).  . colchicine 0.6 MG tablet Take 0.6 mg by mouth every evening.   . docusate sodium (COLACE) 100 MG capsule Take 1 capsule (100 mg total) by mouth 2 (two) times daily. (Patient not taking: Reported on 05/04/2018)  . ELIQUIS 5 MG TABS tablet TAKE 1 TABLET BY MOUTH TWICE A DAY (Patient taking differently: Take 5 mg by mouth 2 (two) times daily. )  . ezetimibe (ZETIA) 10 MG tablet Take 1 tablet (10 mg total) by mouth daily. (Patient taking differently: Take 10 mg by mouth daily at 2 PM. )  . ferrous sulfate 325 (65 FE) MG EC tablet Take 325 mg by mouth daily with breakfast.  . fexofenadine (ALLEGRA) 180 MG tablet Take 180 mg by mouth daily as needed for allergies.     . fluticasone (FLONASE) 50 MCG/ACT nasal spray Place 2 sprays into the nose daily as needed for allergies  or rhinitis.   . furosemide (LASIX) 80 MG tablet Take 1 tablet (80 mg total) by mouth 2 (two) times daily.  Marland Kitchen glucose blood (ACCU-CHEK SMARTVIEW) test strip 1 each by Other route as needed for other. Use as instructed  . HYDROcodone-acetaminophen (NORCO/VICODIN) 5-325 MG tablet Take 1-2 tablets by mouth every 6 (six) hours as needed (postop hip pain). (Patient not taking: Reported on 05/04/2018)  . Insulin Glargine (TOUJEO SOLOSTAR) 300 UNIT/ML SOPN Inject 28 Units into the skin daily with supper.   . isosorbide mononitrate (IMDUR) 60 MG 24 hr tablet Take 1 tablet (60 mg total) by mouth daily.  Marland Kitchen lisinopril (PRINIVIL,ZESTRIL) 40 MG tablet Take 1 tablet (40 mg total) by mouth daily.  Marland Kitchen LORazepam (ATIVAN) 1 MG tablet Take 1 mg by mouth See admin instructions. Take 1 mg by mouth two times a day- morning and evening  . Multiple Vitamins-Minerals (MULTIVITAMIN WITH MINERALS) tablet Take 1 tablet by mouth daily.  . nebivolol (BYSTOLIC) 10 MG tablet Take 1 tablet (10 mg total) by mouth daily.  . nitroGLYCERIN (NITROSTAT) 0.4 MG SL tablet DISSOLVE 1 TABLET (0.4 MG TOTAL) UNDER THE TONGUE EVERY 5 (FIVE) MINUTES AS NEEDED FOR CHEST PAIN. (Patient taking differently: Place 0.4 mg under the tongue every 5 (five) minutes as needed for chest pain. )  . ondansetron (ZOFRAN) 4 MG tablet Take 1 tablet (4 mg total) by mouth every 6 (six) hours as needed for nausea. (Patient not taking: Reported on 05/04/2018)  . pantoprazole (PROTONIX) 40 MG tablet Take 1 tablet (40 mg total) by mouth daily. (Patient not taking: Reported on 05/04/2018)  . potassium chloride SA (KLOR-CON M20) 20 MEQ tablet Take 1 tablet (20 mEq total) by mouth 2 (two) times daily. (Patient taking differently: Take 20 mEq by mouth See admin instructions. Take 20 mEq by mouth two times a day- morning and afternoon)  . senna (SENOKOT) 8.6 MG TABS  tablet Take 1 tablet (8.6 mg total) by mouth 2 (two) times daily. (Patient not taking: Reported on 05/04/2018)    No Known Allergies  Social History   Tobacco Use  . Smoking status: Former Smoker    Packs/day: 0.50    Years: 6.00    Pack years: 3.00    Types: Cigarettes    Last attempt to quit: 05/18/1992    Years since quitting: 25.9  . Smokeless tobacco: Never Used  Substance Use Topics  . Alcohol use: No    Comment: 07/13/11 "have drank occasionally; not now"  . Drug use: No   Social History   Social History Narrative   She is a married mother of 9, grandmother 2. Usually accompanied by her husband. She does not work. She had been working on her exercise, but is no longer as active. Does not drink and does not smoke    family history includes Atrial fibrillation in her son; Coronary artery disease in her mother; Hypertension in her mother.  Wt Readings from Last 3 Encounters:  05/04/18 224 lb (101.6 kg)  05/02/18 228 lb 9.6 oz (103.7 kg)  04/22/18 224 lb 14.4 oz (102 kg)    PHYSICAL EXAM BP 121/85   Ht _0  (1.6 m)   Wt 228 lb 9.6 oz (103.7 kg)   BMI 40.49 kg/m  Physical Exam  Constitutional: She is oriented to person, place, and time. She appears well-developed. No distress (Just looks like she does not feel well).  Obese.  Tired appearing  HENT:  Head: Normocephalic and atraumatic.  Neck:  Normal range of motion. Neck supple. JVD (Roughly 10-12 cm water.) present. No hepatojugular reflux present. Carotid bruit is not present.  Cardiovascular: S1 normal and S2 normal. An irregularly irregular rhythm present. Tachycardia present. PMI is not displaced (Unable to palpate). Exam reveals distant heart sounds and decreased pulses (Because of edema). Exam reveals no gallop and no friction rub.  Murmur (1/6 SM at RUSB.  Difficult to assess if holosystolic or ejection murmur.) heard. Pulmonary/Chest: Effort normal. No respiratory distress. She has no wheezes.  Mild  interstitial sounds, but no obvious rales or rhonchi.  Abdominal: Soft. Bowel sounds are normal. She exhibits no distension. There is no abdominal tenderness. There is no rebound.  Obese.  No HSM  Musculoskeletal: Normal range of motion.        General: Edema (At least 2 if not 3+ bilateral lower extremity edema it is very tense and firm.) present.  Neurological: She is alert and oriented to person, place, and time.  Skin: Skin is warm and dry.  No obvious rash, but very exquisite sensitivity to touch in the legs.  Tense lower extremity edema.  Psychiatric:  Very depressed and dejected.  Her husband did most of the talking today which is unusual.  Vitals reviewed.   Adult ECG Report  Rate: 101 ;  Rhythm: atrial fibrillation and Rapid ventricular rate.  Left axis deviation.  At least one PVC noted.;   Narrative Interpretation: Heart rate is faster than usual.   Other studies Reviewed: Additional studies/ records that were reviewed today include:  Recent Labs:  Labs on December 6 showed creatinine 1.43, potassium 4.0.   ASSESSMENT / PLAN: Problem List Items Addressed This Visit    Acute on chronic combined systolic and diastolic CHF (congestive heart failure) ([place])    Pretty significant edema today.  She has a good component of her heart failure as right-sided heart failure with pulmonary hypertension.  It is hard to tell really but I think her weight is back up to what it was when she was initially hospitalized.  I am fearful that she may actually need more IV Lasix and needs to go back to the hospital, but she really does not want to have to go.  Her EF is probably around 45% so did not really change much.  Not unexpectedly a little lower when she was sicker, and in the hospital. I think we just need to get her over the hump of having gotten off of her diuretic.       Atrial fibrillation, permanent ([place]); CHA2DS2Vasc - 6 (Chronic)   Relevant Orders   EKG 12-Lead (Completed)   CAD  S/P percutaneous coronary angioplasty (Chronic)    Distant history of CAD with PCI.  Has not had any angina, had mild troponin elevation in the setting of her CHF exacerbation, but no anginal symptoms. Is on beta-blocker and Imdur along with statin.  Not on aspirin or Plavix because of Eliquis.      Chronic combined systolic and diastolic heart failure, NYHA class 2 (HCC) (Chronic)   Relevant Orders   EKG 12-Lead (Completed)   Edema of both legs (Chronic)    Pretty significant amount of edema today.  Continue 80 mg twice daily Lasix.  May very well need to is do Zaroxolyn. I think she really would benefit from more IV Lasix, but she did not want to go back to the hospital.      Essential hypertension (Chronic)    Was on combination of Bystolic  and Avapro.  Now she is been switched to lisinopril.  I wonder if maybe the itching can come from lisinopril.  Low threshold to switch her back to an ARB.      Hyperlipidemia with target LDL less than 70 (Chronic)    On statin and Zetia.  Can follow-up later.      Itching - Primary    I am not exactly sure what is causing your itching, but it probably is related to her edema.   I will give her a try of short burst of steroids although I am worried that that may cause her volume to go up.  Hopefully we can get her itching under control and then that would allow [place] to then get the rest of symptoms under control.  If she can get some rest, we may be other improve her symptoms. She has not slept well at night so we will prescribe Atarax which will help with itching and sleep. We will also prescribe triamcinolone cream.      Pulmonary HTN (HCC) (Chronic)   Relevant Orders   EKG 12-Lead (Completed)       I spent a total of 60 minutes with the patient and chart review. >  50% of the time was spent in direct patient consultation.  I spent a long time in the room with them.  They went through the whole story about their son dying.  We talked a lot  about her's psychological issues revolving this sad situation.  We also then discussed her edema and her itching.  She really just wanted relief of relief from the itching.  My goal was to try to alleviate the symptoms and then deal with the obvious edema issue later.  The majority of this visit was reassuring and comforting her with the loss of her son, and trying to get to the underlying root of her problem which was really more of fatigue from not sleeping well.  She was doing so well just after her surgery and we just need to get her back to that level.  Current medicines are reviewed at length with the patient today.  (+/- concerns) needs something for itching and sleep The following changes have been made:  See below  Patient Instructions  Medication Instructions:  START prednisone-take 60 mg daily x 2 days, then 40 mg daily x 2 days, then 20 mg daily x 2 days  Take hydroxyzine (Atarax) 1-2 tablets at bedtime as needed for itching/sleep  Apply triamcinolone cream to lower legs 2 times daily for 2 weeks  If you need a refill on your cardiac medications before your next appointment, please call your pharmacy.   Follow-Up: At [facility], you and your health needs are our priority.  As part of our continuing mission to provide you with exceptional heart care, we have created designated Provider Care Teams.  These Care Teams include your primary Cardiologist (physician) and Advanced Practice Providers (APPs -  Physician Assistants and Nurse Practitioners) who all work together to provide you with the care you need, when you need it. You will need a follow up appointment in 1 months.  Please call our office 2 months in advance to schedule this appointment.  You may see Glenetta Hew, MD or one of the following Advanced Practice Providers on your designated Care Team:   Rosaria Ferries, PA-C . Jory Sims, DNP, ANP      Studies Ordered:   Orders Placed This Encounter  Procedures    .  EKG 12-Lead      Glenetta Hew, M.D., M.S. Interventional Cardiologist   Pager # [phone] Phone # [phone] [address] [place], [place] [address]   Thank you for choosing Heartcare at [facility]!!

## 2018-05-03 ENCOUNTER — Telehealth: Payer: Self-pay | Admitting: Cardiology

## 2018-05-03 NOTE — Telephone Encounter (Signed)
New message    Kenney Houseman was calling to check the status of orders for a Rolator ?

## 2018-05-03 NOTE — Telephone Encounter (Signed)
Spoke with Kenney Houseman from [facility] home health who was checking on the status of order for Rolator. She states a requested was faxed on 04/25/18. Informed a message will be routed to Dr. Allison Quarry Nurse.

## 2018-05-04 ENCOUNTER — Emergency Department (HOSPITAL_COMMUNITY): Payer: Medicare Other

## 2018-05-04 ENCOUNTER — Telehealth: Payer: Self-pay | Admitting: Cardiology

## 2018-05-04 ENCOUNTER — Emergency Department (HOSPITAL_COMMUNITY)
Admission: EM | Admit: 2018-05-04 | Discharge: 2018-05-04 | Disposition: A | Discharge status: Home / Self Care | Payer: Medicare Other | Attending: Emergency Medicine | Admitting: Emergency Medicine

## 2018-05-04 ENCOUNTER — Other Ambulatory Visit: Payer: Self-pay

## 2018-05-04 ENCOUNTER — Encounter (HOSPITAL_COMMUNITY): Payer: Self-pay | Admitting: Emergency Medicine

## 2018-05-04 DIAGNOSIS — Z7901 Long term (current) use of anticoagulants: Secondary | ICD-10-CM | POA: Insufficient documentation

## 2018-05-04 DIAGNOSIS — I509 Heart failure, unspecified: Secondary | ICD-10-CM | POA: Diagnosis not present

## 2018-05-04 DIAGNOSIS — N183 Chronic kidney disease, stage 3 (moderate): Secondary | ICD-10-CM | POA: Diagnosis not present

## 2018-05-04 DIAGNOSIS — R0789 Other chest pain: Secondary | ICD-10-CM | POA: Diagnosis not present

## 2018-05-04 DIAGNOSIS — I130 Hypertensive heart and chronic kidney disease with heart failure and stage 1 through stage 4 chronic kidney disease, or unspecified chronic kidney disease: Secondary | ICD-10-CM | POA: Diagnosis not present

## 2018-05-04 DIAGNOSIS — E1122 Type 2 diabetes mellitus with diabetic chronic kidney disease: Secondary | ICD-10-CM | POA: Diagnosis not present

## 2018-05-04 DIAGNOSIS — I4891 Unspecified atrial fibrillation: Secondary | ICD-10-CM | POA: Diagnosis not present

## 2018-05-04 DIAGNOSIS — Z79899 Other long term (current) drug therapy: Secondary | ICD-10-CM | POA: Diagnosis not present

## 2018-05-04 DIAGNOSIS — I2510 Atherosclerotic heart disease of native coronary artery without angina pectoris: Secondary | ICD-10-CM | POA: Insufficient documentation

## 2018-05-04 DIAGNOSIS — Z87891 Personal history of nicotine dependence: Secondary | ICD-10-CM | POA: Insufficient documentation

## 2018-05-04 DIAGNOSIS — R0602 Shortness of breath: Secondary | ICD-10-CM | POA: Diagnosis present

## 2018-05-04 DIAGNOSIS — L299 Pruritus, unspecified: Secondary | ICD-10-CM | POA: Diagnosis not present

## 2018-05-04 LAB — CBC WITH DIFFERENTIAL/PLATELET
Abs Immature Granulocytes: 0.03 10*3/uL (ref 0.00–0.07)
Basophils Absolute: 0 10*3/uL (ref 0.0–0.1)
Basophils Relative: 0 %
Eosinophils Absolute: 0 10*3/uL (ref 0.0–0.5)
Eosinophils Relative: 0 %
HCT: 32.3 % — ABNORMAL LOW (ref 36.0–46.0)
Hemoglobin: 8.9 g/dL — ABNORMAL LOW (ref 12.0–15.0)
Immature Granulocytes: 0 %
Lymphocytes Relative: 6 %
Lymphs Abs: 0.5 10*3/uL — ABNORMAL LOW (ref 0.7–4.0)
MCH: 21.7 pg — ABNORMAL LOW (ref 26.0–34.0)
MCHC: 27.6 g/dL — ABNORMAL LOW (ref 30.0–36.0)
MCV: 78.8 fL — ABNORMAL LOW (ref 80.0–100.0)
Monocytes Absolute: 0.3 10*3/uL (ref 0.1–1.0)
Monocytes Relative: 4 %
NRBC: 0.5 % — AB (ref 0.0–0.2)
Neutro Abs: 7.3 10*3/uL (ref 1.7–7.7)
Neutrophils Relative %: 90 %
Platelets: 254 10*3/uL (ref 150–400)
RBC: 4.1 MIL/uL (ref 3.87–5.11)
RDW: 21 % — ABNORMAL HIGH (ref 11.5–15.5)
WBC: 8.2 10*3/uL (ref 4.0–10.5)

## 2018-05-04 LAB — COMPREHENSIVE METABOLIC PANEL
ALBUMIN: 3.5 g/dL (ref 3.5–5.0)
ALT: 33 U/L (ref 0–44)
AST: 42 U/L — ABNORMAL HIGH (ref 15–41)
Alkaline Phosphatase: 91 U/L (ref 38–126)
Anion gap: 14 (ref 5–15)
BILIRUBIN TOTAL: 1.9 mg/dL — AB (ref 0.3–1.2)
BUN: 37 mg/dL — ABNORMAL HIGH (ref 8–23)
CO2: 26 mmol/L (ref 22–32)
Calcium: 9.8 mg/dL (ref 8.9–10.3)
Chloride: 101 mmol/L (ref 98–111)
Creatinine, Ser: 1.96 mg/dL — ABNORMAL HIGH (ref 0.44–1.00)
GFR calc Af Amer: 30 mL/min — ABNORMAL LOW (ref >60)
GFR calc non Af Amer: 25 mL/min — ABNORMAL LOW (ref >60)
GLUCOSE: 108 mg/dL — AB (ref 70–99)
Potassium: 4.3 mmol/L (ref 3.5–5.1)
SODIUM: 141 mmol/L (ref 135–145)
Total Protein: 7.3 g/dL (ref 6.5–8.1)

## 2018-05-04 LAB — TROPONIN I: Troponin I: 0.06 ng/mL (ref <0.03)

## 2018-05-04 LAB — BRAIN NATRIURETIC PEPTIDE: B Natriuretic Peptide: 616.1 pg/mL — ABNORMAL HIGH (ref 0.0–100.0)

## 2018-05-04 MED ORDER — FUROSEMIDE 10 MG/ML IJ SOLN
80.0000 mg | Freq: Once | INTRAMUSCULAR | Status: AC
  Administered 2018-05-04: 80 mg via INTRAVENOUS
  Filled 2018-05-04: qty 8

## 2018-05-04 MED ORDER — GABAPENTIN 100 MG PO CAPS
200.0000 mg | ORAL_CAPSULE | Freq: Once | ORAL | Status: AC
  Administered 2018-05-04: 200 mg via ORAL
  Filled 2018-05-04: qty 2

## 2018-05-04 MED ORDER — GABAPENTIN 100 MG PO CAPS: 100.0000 mg | ORAL_CAPSULE | Freq: Three times a day (TID) | ORAL | 0 refills | Status: DC

## 2018-05-04 NOTE — ED Notes (Signed)
ED Provider at bedside. 

## 2018-05-04 NOTE — ED Notes (Signed)
IV access attempted X1

## 2018-05-04 NOTE — ED Notes (Signed)
XR

## 2018-05-04 NOTE — ED Notes (Signed)
Patient verbalizes understanding of discharge instructions. Opportunity for questioning and answers were provided. 

## 2018-05-04 NOTE — ED Notes (Signed)
Pt requesting something for itching of legs and feet

## 2018-05-04 NOTE — Telephone Encounter (Signed)
[facility] health nurse is calling, states the pt is complaining of chest tightness, SOB, HR of 88 BP-140/90, respiration-24, glucose 157 and rated fatigue  moderate 3/10 at resting   Pt c/o of Chest Pain: STAT if CP now or developed within 24 hours  1. Are you having CP right now? no  2. Are you experiencing any other symptoms (ex. SOB, nausea, vomiting, sweating)? SOB  3. How long have you been experiencing CP? Since this morning   4. Is your CP continuous or coming and going? Coming and going  5. Have you taken Nitroglycerin?  ?

## 2018-05-04 NOTE — Telephone Encounter (Signed)
Received call from [place] with [facility] home health.She stated patient did not sleep well last night.Stated she is complaining of sob.No chest pain.1+ edema in lower legs.Stated hard for patient to walk from room to room without being sob.Advised to go to [facility] ED.

## 2018-05-04 NOTE — ED Triage Notes (Addendum)
Per EMS: Pt from home with c/o epigastric pain, sob, weakness, and urinary symptoms.  Pt was recently hospitalized at [facility] with HF and weakness.  EMS administered 324 ASA PTA.  Pt also notes a 3lb weight gain in past 2 days.    EMS vitals:  BP 155/80 HR 110 RR 22 Spo2 96% RA    CBG 250

## 2018-05-04 NOTE — Discharge Instructions (Addendum)
Increase the Lasix as discussed with your cardiologist.

## 2018-05-04 NOTE — ED Provider Notes (Signed)
**Note DeStephanieIdentified via Obfuscation** [facility] EMERGENCY DEPARTMENT Provider Note   CSN: [id number] Arrival date & time: 05/04/18  1456     History   Chief Complaint No chief complaint on file.   HPI Stephanie Franco is a 67 y.o. female.  HPI Patient presents with shortness of breath and chest pain.  Has had for the last couple days but actually been feeling bad for around 5 days to a week.  Has not really been able to lay flat.  Has some swelling in her legs.  Has had itching in her legs since her son died a couple months ago.  States she has been on many different medications including just recently Atarax and prednisone.  No fevers.  No cough.  Itching is in both of her lower legs.  Has had some chest tightness.  History of CHF.  Her weight is up a couple pounds over the last day.  States she is been having difficulty sleeping both from laying flat and from the itching. Past Medical History:  Diagnosis Date  . Aortic atherosclerosis ([place])   . Arthritis   . Asthma   . Atrial fibrillation, permanent    Rate control with Bystolic. CHA2DS2Vasc = 6 (HTN, DM, CHF, Age 34, Female) -> on Pradaxa  . Breast cancer ([place]) 2008   S/P mastectomy  . CAD S/P percutaneous coronary angioplasty 2006;    PCI of circumflex with Taxus DES;; relookStephaniecath Feb 2013: 50Stephanie60% short lesion in RCA, 40% ISR Circumflex stent.  . Cardiomegaly   . CKD (chronic kidney disease), stage III ([place])   . Complication of anesthesia    prolonged sedation after colonoscopy in [place]  . Diabetes mellitus, uncontrolled 07/14/2011  . Dilated cardiomyopathy ([place])    Mostly Resolved (EF up from ~25% to ~45% by Echo)  . DOE (dyspnea on exertion)   . Edema of both legs    Usually mild, chronic. Controlled with when necessary furosemide and diet  . 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 GERD (gastroesophageal reflux disease)   . Gout   . Hyperlipidemia   . Hypertension   . Hypokalemia   . Morbid obesity (H[facility])    BMI 41  . OSA on  CPAP    On CPAP  . Pulmonary hypertension, unspecified ([place])    PAP ~90 mmHg on Echo 12/2017 -- has OSA on CPAP & obesity - but Overall Improved Sx of dyspnea / edema  . Systolic murmur     Patient Active Problem List   Diagnosis Date Noted  . Itching 05/02/2018  . Acute on chronic combined systolic and diastolic CHF (congestive heart failure) ([place]) 04/16/2018  . GERD (gastroesophageal reflux disease) 04/15/2018  . Gout 04/15/2018  . Hypomagnesemia 04/15/2018  . Elevated troponin 04/15/2018  . AKI (acute kidney injury) ([place]) 04/15/2018  . Primary osteoarthritis of right hip 01/19/2018  . Osteoarthritis of right hip 01/19/2018  . Pulmonary HTN ([place]) 01/06/2018  . Systolic murmur 33/82/5053  . Preoperative cardiovascular examination 12/10/2017  . Abnormal serum creatinine level 05/31/2017  . Medication management 11/16/2016  . Congestive dilated cardiomyopathy ([place]) 01/29/2015  . Chronic combined systolic and diastolic heart failure, NYHA class 2 ([place])   . Hypokalemia 10/31/2014  . Pyelonephritis 10/31/2014  . Abnormal liver function 10/31/2014  . Acute renal failure superimposed on stage 3 chronic kidney disease ([place])   . Fever 06/29/2013  . Severe obesity (BMI >= 40) ([place]) 06/24/2013  . Leg cramping 06/24/2013  . DOE (dyspnea on  exertion) 12/20/2012    Class: Diagnosis of  . CAD S/P percutaneous coronary angioplasty   . Atrial fibrillation, permanent ([place]); CHA2DS2Vasc - 6   . Edema of both legs   . Diabetes mellitus ([place]) 07/14/2011  . Morbid obesity ([place]) 07/12/2011  . Uterine fibroid, (bleeding issues when she was put on Pradaxa for AF) 07/12/2011  . Hyperlipidemia with target LDL less than 70 07/12/2011  . OSA on CPAP 07/12/2011  . Essential hypertension     Past Surgical History:  Procedure Laterality Date  . COLONOSCOPY    . CORONARY ANGIOPLASTY WITH STENT PLACEMENT  2006   stent to circumflex  . DILATION AND CURETTAGE OF UTERUS    . LEFT HEART CATHETERIZATION  WITH CORONARY ANGIOGRAM N/A 07/13/2011   Procedure: LEFT HEART CATHETERIZATION WITH CORONARY ANGIOGRAM;  Surgeon: Leonie Man, MD;  Location: [facility] CATH LAB;  Service: Cardiovascular;  normal EF; 35m 50Stephanie60% lesion in RCA; patent stent in circumflex form 2006 with ~40% inStephaniestent reStephaniestenosis  . MASTECTOMY  2008   left  . NM MYOVIEW LTD  11/22/2014   Low Risk. No ischemia or infarction. Not gated 2/2  A. fib - unable to assess EF  . TOTAL HIP ARTHROPLASTY Right 01/19/2018   Procedure: RIGHT TOTAL HIP ARTHROPLASTY ANTERIOR APPROACH;  Surgeon: SRod Can MD;  Location: [facility] ORS;  Service: Orthopedics;  Laterality: Right;  Needs RNFA  . TRANSTHORACIC ECHOCARDIOGRAM  01/2015   LV EF 40Stephanie45%, Mild Anteroseptal HK. ModStephanieSevere  RA dilation with elevated PA Pressures (~~peak 63 mmHg).. Mild LA dilation  . TRANSTHORACIC ECHOCARDIOGRAM  June 2015   In setting of Urosepsis: EF ~25 %, global HK (poor quality study)  . TRANSTHORACIC ECHOCARDIOGRAM  12/2017   Improved LVEF to 45 to 50%.  Pulmonary hypertension estimated PA pressures of 90 mmHg. Severe LA dilation& mild /rv dilation.     OB History   No obstetric history on file.      Home Medications    Prior to Admission medications   Medication Sig Start Date End Date Taking? Authorizing Provider  albuterol (PROVENTIL) (2.5 MG/3ML) 0.083% nebulizer solution Take 3 mLs (2.5 mg total) by nebulization every 6 (six) hours as needed for wheezing or shortness of breath. 05/20/15  Yes NHarvel Quale MD  allopurinol (ZYLOPRIM) 300 MG tablet Take 300 mg by mouth 2 (two) times daily.    Yes [provider]  atorvastatin (LIPITOR) 40 MG tablet Take 1 tablet (40 mg total) by mouth daily. 12/10/17  Yes HLeonie Man MD  colchicine 0.6 MG tablet Take 0.6 mg by mouth every evening.    Yes [provider]  ELIQUIS 5 MG TABS tablet TAKE 1 TABLET BY MOUTH TWICE A DAY Patient taking differently: Take 5 mg by mouth 2 (two) times daily.  04/18/18  Yes  HLeonie Man MD  ezetimibe (ZETIA) 10 MG tablet Take 1 tablet (10 mg total) by mouth daily. Patient taking differently: Take 10 mg by mouth daily at 2 PM.  12/10/17  Yes HLeonie Man MD  ferrous sulfate 325 (65 FE) MG EC tablet Take 325 mg by mouth daily with breakfast.   Yes [provider]  fexofenadine (ALLEGRA) 180 MG tablet Take 180 mg by mouth daily as needed for allergies.  04/30/15  Yes [provider]  fluticasone (FLONASE) 50 MCG/ACT nasal spray Place 2 sprays into the nose daily as needed for allergies or rhinitis.    Yes [provider]  furosemide (LASIX) 80  MG tablet Take 1 tablet (80 mg total) by mouth 2 (two) times daily. 04/22/18  Yes Charlynne Cousins, MD  hydrOXYzine (ATARAX/VISTARIL) 25 MG tablet 1Stephanie2 tablets at bedtime as needed for itching/sleep Patient taking differently: Take 25Stephanie50 mg by mouth at bedtime as needed for itching.  05/02/18  Yes Leonie Man, MD  Insulin Glargine (TOUJEO SOLOSTAR) 300 UNIT/ML SOPN Inject 28 Units into the skin daily with supper.    Yes [provider]  isosorbide mononitrate (IMDUR) 60 MG 24 hr tablet Take 1 tablet (60 mg total) by mouth daily. 12/10/17  Yes Leonie Man, MD  LORazepam (ATIVAN) 1 MG tablet Take 1 mg by mouth See admin instructions. Take 1 mg by mouth two times a day- morning and evening 07/07/16  Yes [provider]  Multiple VitaminsStephanieMinerals (MULTIVITAMIN WITH MINERALS) tablet Take 1 tablet by mouth daily.   Yes [provider]  nebivolol (BYSTOLIC) 10 MG tablet Take 1 tablet (10 mg total) by mouth daily. 12/10/17  Yes Leonie Man, MD  nitroGLYCERIN (NITROSTAT) 0.4 MG SL tablet DISSOLVE 1 TABLET (0.4 MG TOTAL) UNDER THE TONGUE EVERY 5 (FIVE) MINUTES AS NEEDED FOR CHEST PAIN. Patient taking differently: Place 0.4 mg under the tongue every 5 (five) minutes as needed for chest pain.  07/13/17  Yes Leonie Man, MD  potassium chloride SA (KLORStephanieCON M20) 20  MEQ tablet Take 1 tablet (20 mEq total) by mouth 2 (two) times daily. Patient taking differently: Take 20 mEq by mouth See admin instructions. Take 20 mEq by mouth two times a day- morning and afternoon 12/10/17  Yes Leonie Man, MD  triamcinolone ointment (KENALOG) 0.5 % Apply to legs 2 times daily for 2 weeks Patient taking differently: Apply 1 application topically See admin instructions. Apply to legs 2 times daily for 2 weeks 05/02/18  Yes Leonie Man, MD  albuterol (PROVENTIL HFA;VENTOLIN HFA) 108 (90 Base) MCG/ACT inhaler Inhale 1Stephanie2 puffs into the lungs every 6 (six) hours as needed for wheezing or shortness of breath. Patient not taking: Reported on 05/04/2018 05/20/15   Harvel Quale, MD  blood glucose meter kit and supplies KIT Dispense based on patient and insurance preference. Use up to four times daily as directed. (FOR ICDStephanie9 250.00, 250.01). 04/22/18   Charlynne Cousins, MD  docusate sodium (COLACE) 100 MG capsule Take 1 capsule (100 mg total) by mouth 2 (two) times daily. Patient not taking: Reported on 05/04/2018 01/20/18   Rod Can, MD  gabapentin (NEURONTIN) 100 MG capsule Take 1 capsule (100 mg total) by mouth 3 (three) times daily. 05/04/18   Davonna Belling, MD  glucose blood (ACCUStephanieCHEK SMARTVIEW) test strip 1 each by Other route as needed for other. Use as instructed    [provider]  HYDROcodoneStephanieacetaminophen (NORCO/VICODIN) 5Stephanie325 MG tablet Take 1Stephanie2 tablets by mouth every 6 (six) hours as needed (postop hip pain). Patient not taking: Reported on 05/04/2018 01/20/18   Rod Can, MD  lisinopril (PRINIVIL,ZESTRIL) 40 MG tablet Take 1 tablet (40 mg total) by mouth daily. 02/08/18 05/09/18  Leonie Man, MD  ondansetron (ZOFRAN) 4 MG tablet Take 1 tablet (4 mg total) by mouth every 6 (six) hours as needed for nausea. Patient not taking: Reported on 05/04/2018 01/20/18   Rod Can, MD  pantoprazole (PROTONIX) 40 MG tablet Take 1 tablet (40 mg  total) by mouth daily. Patient not taking: Reported on 05/04/2018 12/10/17   Leonie Man, MD  senna (SENOKOT) 8.6 MG  TABS tablet Take 1 tablet (8.6 mg total) by mouth 2 (two) times daily. Patient not taking: Reported on 05/04/2018 01/20/18   Rod Can, MD    Family History Family History  Problem Relation Age of Onset  . Coronary artery disease Mother   . Hypertension Mother   . Atrial fibrillation Son     Social History Social History   Tobacco Use  . Smoking status: Former Smoker    Packs/day: 0.50    Years: 6.00    Pack years: 3.00    Types: Cigarettes    Last attempt to quit: 05/18/1992    Years since quitting: 25.9  . Smokeless tobacco: Never Used  Substance Use Topics  . Alcohol use: No    Comment: 07/13/11 "have drank occasionally; not now"  . Drug use: No     Allergies   Patient has no known allergies.   Review of Systems Review of Systems  Constitutional: Negative for appetite change and fever.  HENT: Negative for congestion.   Respiratory: Positive for shortness of breath.   Cardiovascular: Positive for chest pain.  Gastrointestinal: Negative for abdominal pain.  Genitourinary: Negative for flank pain.  Musculoskeletal: Negative for back pain.  Skin: Negative for color change.  Neurological: Negative for weakness.  Psychiatric/Behavioral: Negative for confusion.     Physical Exam Updated Vital Signs BP (!) 137/102   Pulse (!) 101   Temp 98 F (36.7 C) (Oral)   Resp (!) 26   Ht 5' 3"  (1.6 m)   Wt 101.6 kg   SpO2 100%   BMI 39.68 kg/m   Physical Exam HENT:     Head: Normocephalic and atraumatic.     Nose: Nose normal.  Neck:     Musculoskeletal: Neck supple.  Cardiovascular:     Rate and Rhythm: Normal rate.     Comments: Irregular rhythm Pulmonary:     Breath sounds: Rales present.     Comments: Rales bilateral bases Abdominal:     Tenderness: There is no abdominal tenderness.  Musculoskeletal: Normal range of motion.      Right lower leg: Edema present.     Left lower leg: Edema present.     Comments: Pitting edema bilateral lower legs.  Mild chronic change of the skin without erythema.  Skin:    General: Skin is warm.  Neurological:     General: No focal deficit present.     Mental Status: She is alert.  Psychiatric:        Mood and Affect: Mood normal.      ED Treatments / Results  Labs (all labs ordered are listed, but only abnormal results are displayed) Labs Reviewed  CBC WITH DIFFERENTIAL/PLATELET - Abnormal; Notable for the following components:      Result Value   Hemoglobin 8.9 (*)    HCT 32.3 (*)    MCV 78.8 (*)    MCH 21.7 (*)    MCHC 27.6 (*)    RDW 21.0 (*)    nRBC 0.5 (*)    Lymphs Abs 0.5 (*)    All other components within normal limits  COMPREHENSIVE METABOLIC PANEL - Abnormal; Notable for the following components:   Glucose, Bld 108 (*)    BUN 37 (*)    Creatinine, Ser 1.96 (*)    AST 42 (*)    Total Bilirubin 1.9 (*)    GFR calc non Af Amer 25 (*)    GFR calc Af Amer 30 (*)    All  other components within normal limits  TROPONIN I - Abnormal; Notable for the following components:   Troponin I 0.06 (*)    All other components within normal limits  BRAIN NATRIURETIC PEPTIDE - Abnormal; Notable for the following components:   B Natriuretic Peptide 616.1 (*)    All other components within normal limits    EKG EKG Interpretation  Date/Time:  Wednesday May 04 2018 15:26:44 EST Ventricular Rate:  97 PR Interval:    QRS Duration: 124 QT Interval:  369 QTC Calculation: 469 R Axis:   -66 Text Interpretation:  Atrial fibrillation Left bundle branch block Confirmed by Davonna Belling [phone]) on 05/04/2018 3:52:04 PM   Radiology Dg Chest 2 View  Result Date: 05/04/2018 CLINICAL DATA:  Shortness of breath and weakness for 3 days. EXAM: CHEST - 2 VIEW COMPARISON:  PA and lateral chest 04/15/2018 and 02/20/2017. FINDINGS: Marked enlargement of the  cardiopericardial silhouette is unchanged. There is some vascular congestion without frank edema. No consolidative process, pneumothorax or effusion. No acute or focal bony abnormality. IMPRESSION: No acute disease. Mild pulmonary vascular congestion and marked cardiomegaly. Electronically Signed   By: Inge Rise M.D.   On: 05/04/2018 16:03    Procedures Procedures (including critical care time)  Medications Ordered in ED Medications  gabapentin (NEURONTIN) capsule 200 mg (200 mg Oral Given 05/04/18 1659)  furosemide (LASIX) injection 80 mg (80 mg Intravenous Given 05/04/18 1900)     Initial Impression / Assessment and Plan / ED Course  I have reviewed the triage vital signs and the nursing notes.  Pertinent labs & imaging results that were available during my care of the patient were reviewed by me and considered in my medical decision making (see chart for details).     Patient with shortness of breath.  Acute on chronic.  Feels better after Lasix.  Not hypoxic with ambulation.  Weight has gone up.  Given IV Lasix here and will increase home.  Also has had itching since her son died.  Uncontrolled with steroids and the steroids may have actually increased her fluid level.  We will stop the steroids and patient felt better with some Neurontin.  Will give 100 mg 3 times a day.  FollowStephanieup as an outpatient.  I think safe for discharge home.  Troponin mildly elevated but this appears to be her baseline.  Final Clinical Impressions(s) / ED Diagnoses   Final diagnoses:  Acute on chronic heart failure, unspecified heart failure type [facility])    ED Discharge Orders         Ordered    gabapentin (NEURONTIN) 100 MG capsule  3 times daily,   Status:  Discontinued     05/04/18 2024    gabapentin (NEURONTIN) 100 MG capsule  3 times daily     05/04/18 2050           Davonna Belling, MD 05/05/18 0020

## 2018-05-05 ENCOUNTER — Ambulatory Visit: Payer: Medicare Other | Admitting: Cardiology

## 2018-05-05 NOTE — Telephone Encounter (Signed)
Spoke to [place] , see is out on leave- phone obtain for office-- Spoke to [employer] at [facility]- verbal order given for rollator walker per Dr  Ellyn Hack.,Per physical therapy request-   will  Refax order for signature  patient's husband aware

## 2018-05-05 NOTE — Telephone Encounter (Signed)
F/U Message         Patient's husband is calling checking status on Rolator. Pls call and advise.

## 2018-05-06 ENCOUNTER — Encounter: Payer: Self-pay | Admitting: Cardiology

## 2018-05-06 NOTE — Assessment & Plan Note (Signed)
On statin and Zetia.  Can follow-up later.

## 2018-05-06 NOTE — Assessment & Plan Note (Signed)
Pretty significant edema today.  She has a good component of her heart failure as right-sided heart failure with pulmonary hypertension.  It is hard to tell really but I think her weight is back up to what it was when she was initially hospitalized.  I am fearful that she may actually need more IV Lasix and needs to go back to the hospital, but she really does not want to have to go.  Her EF is probably around 45% so did not really change much.  Not unexpectedly a little lower when she was sicker, and in the hospital. I think we just need to get her over the hump of having gotten off of her diuretic.

## 2018-05-06 NOTE — Assessment & Plan Note (Signed)
Distant history of CAD with PCI.  Has not had any angina, had mild troponin elevation in the setting of her CHF exacerbation, but no anginal symptoms. Is on beta-blocker and Imdur along with statin.  Not on aspirin or Plavix because of Eliquis.

## 2018-05-06 NOTE — Assessment & Plan Note (Signed)
Was on combination of Bystolic and Avapro.  Now she is been switched to lisinopril.  I wonder if maybe the itching can come from lisinopril.  Low threshold to switch her back to an ARB.

## 2018-05-06 NOTE — Assessment & Plan Note (Signed)
I am not exactly sure what is causing your itching, but it probably is related to her edema.   I will give her a try of short burst of steroids although I am worried that that may cause her volume to go up.  Hopefully we can get her itching under control and then that would allow [place] to then get the rest of symptoms under control.  If she can get some rest, we may be other improve her symptoms. She has not slept well at night so we will prescribe Atarax which will help with itching and sleep. We will also prescribe triamcinolone cream.

## 2018-05-06 NOTE — Assessment & Plan Note (Addendum)
Pretty significant amount of edema today.  Continue 80 mg twice daily Lasix.  May very well need to is do Zaroxolyn. I think she really would benefit from more IV Lasix, but she did not want to go back to the hospital.

## 2018-05-12 ENCOUNTER — Telehealth: Payer: Self-pay | Admitting: Cardiology

## 2018-05-12 NOTE — Telephone Encounter (Signed)
Returned call to husband (ok per DPR)- he states patient had OV with Dr. Ellyn Hack and was prescribed Atarax, prednisone, and triamcinolone cream for leg itching.    He states this has not improved and has gotten worse.  Patient describes the pain as itching, burning, sharp, shooting pains.  She went to the ER due to this and was prescribed gapapentin which has not helped either.  She is unable to sleep due to the itching and sharp pains in her legs.   She is becoming weak due to lack of sleep.    Husband states swelling has actually improved since OV.   He is wondering what they can do in order to help patient as she is miserable.    Advised would route to Dr. Ellyn Hack to review and advise.

## 2018-05-12 NOTE — Telephone Encounter (Signed)
Per Dr. Ellyn Hack, defer to PCP vs. Dermatologist.     Spoke to husband-he contacted PCP who suggested patient take her pain medication as prescribed.  Patient will be seeing PCP on Monday to discuss and follow up as well.

## 2018-05-12 NOTE — Telephone Encounter (Signed)
New message     Pt c/o medication issue:  1. Name of Medication: hydrOXYzine (ATARAX/VISTARIL) 25 MG tablet  Predisone 10 mg  2. How are you currently taking this medication (dosage and times per day)? Predisone 10 mg take 6 tabs  then 4 tabs  then 2.   hydrOXYzine-1/2 tab at bedtime for itching and to help sleep    3. Are you having a reaction (difficulty breathing--STAT)? No   4. What is your medication issue? Pt husband calling on behalf stating that medication listed above is not working . Pt is having burning and itching on her legs

## 2018-05-23 ENCOUNTER — Telehealth: Payer: Self-pay | Admitting: Cardiology

## 2018-05-23 NOTE — Telephone Encounter (Signed)
Spoke with Samira @ [employer]. She states patient is waiting on rollator & scooter from DME. They faxed over order for MD to sign on 12/19 and then again today. Advised her that both MD and RN are out of the office.   A letter can be composed and signed/stamped stating what patient needs and faxed to [phone]  Routed to [facility]

## 2018-05-23 NOTE — Telephone Encounter (Signed)
New Message    Samira from [facility] is calling to see where the order for the DME was sent to. Please call to discuss.

## 2018-05-24 ENCOUNTER — Other Ambulatory Visit: Payer: Self-pay | Admitting: Cardiology

## 2018-05-26 ENCOUNTER — Encounter: Payer: Self-pay | Admitting: Cardiology

## 2018-05-26 ENCOUNTER — Ambulatory Visit (INDEPENDENT_AMBULATORY_CARE_PROVIDER_SITE_OTHER): Payer: Medicare Other | Admitting: Cardiology

## 2018-05-26 VITALS — BP 103/75 | HR 89 | Ht 63.0 in | Wt 238.0 lb

## 2018-05-26 DIAGNOSIS — R600 Localized edema: Secondary | ICD-10-CM

## 2018-05-26 DIAGNOSIS — Z9861 Coronary angioplasty status: Secondary | ICD-10-CM

## 2018-05-26 DIAGNOSIS — I340 Nonrheumatic mitral (valve) insufficiency: Secondary | ICD-10-CM

## 2018-05-26 DIAGNOSIS — R0609 Other forms of dyspnea: Secondary | ICD-10-CM

## 2018-05-26 DIAGNOSIS — E785 Hyperlipidemia, unspecified: Secondary | ICD-10-CM

## 2018-05-26 DIAGNOSIS — I5042 Chronic combined systolic (congestive) and diastolic (congestive) heart failure: Secondary | ICD-10-CM | POA: Diagnosis not present

## 2018-05-26 DIAGNOSIS — I272 Pulmonary hypertension, unspecified: Secondary | ICD-10-CM

## 2018-05-26 DIAGNOSIS — I2510 Atherosclerotic heart disease of native coronary artery without angina pectoris: Secondary | ICD-10-CM | POA: Diagnosis not present

## 2018-05-26 DIAGNOSIS — I5043 Acute on chronic combined systolic (congestive) and diastolic (congestive) heart failure: Secondary | ICD-10-CM

## 2018-05-26 DIAGNOSIS — I10 Essential (primary) hypertension: Secondary | ICD-10-CM

## 2018-05-26 DIAGNOSIS — I4821 Permanent atrial fibrillation: Secondary | ICD-10-CM | POA: Diagnosis not present

## 2018-05-26 MED ORDER — FUROSEMIDE 80 MG PO TABS: ORAL_TABLET | ORAL | 6 refills | Status: DC

## 2018-05-26 NOTE — Telephone Encounter (Signed)
Forms signed and faxed to [facility]

## 2018-05-26 NOTE — Progress Notes (Signed)
PCP: Lin Landsman, MD  Clinic Note: Chief Complaint  Patient presents with  . Follow-up    Feels better, but weight is back up again  . Congestive Heart Failure    Still has swelling including abdominal swelling.  . Cardiac Valve Problem    Mild regurgitation.    HPI: Stephanie Franco is a 68 y.o. female with a PMH below who presents today for hospital follow-up for acute on chronic combined heart failure (mostly complication from travel revolving around the death of her son) She has had any history of Cardiomyopathy (EF roughly 45%, but was previously down to 25 -- presumably combination of both ischemic and nonischemic ) & PAF.  Last stress test was in 2016 when she had onset of A. fib. No evidence of ischemia or infarction.  Echo September 2016: EF was 40-45% with moderate pulmonary hypertension. Wall motion abnormality not explained by her known anatomy As of January 06, 2018 was doing well preop hip surgery.  She had lost a lot of weight.  Stable A. fib but no angina or heart failure.  No signs and symptoms suggesting worsening mitral valve.  Stephanie Franco was last seen by me in clinic on May 02, 2018 as follow-up from her recent hospitalizations for heart failure and weight gain.  All this was.  Predicated on her getting out of her routine dealing with the death of her son and going to the funeral etc.  Had not been taking her medications as usual.  She had gained a lot of weight with swelling.  When I saw her she was miserable, noting edema, bilateral leg itching.  Not sleeping well.  Aching all over.  We made some adjustments with diuretics, but also like to give a trial run of steroids for her itching.  Unfortunately the steroids did lead to volume overload and she went into the emergency room for some IV Lasix.  We gave her a prescription for triamcinolone cream to use on her legs.  She was still noticing itching, and is now been getting some additional lotion to  use.  Recent Hospitalizations:   ER visit 05/04/2018 -was given IV Lasix and discharged home.  (Her PCP increase gabapentin to 300 mg nightly and that seems to have helped her itching and helped her sleep).  No longer using hydroxyzine.  Studies Personally Reviewed - (if available, images/films reviewed: From Epic Chart or Care Everywhere)  Transthoracic Echo 12/17/2017: EF 45-50%-basal inferior hypokinesis.. Mod LAD Dilation. Severe RA dilation.  PAP ~60 mmHg eccentric MR mild to moderate.  Transthoracic Echo 04/15/2018: Mod LVH EF 40-45%.  Diffuse HK.  Unable to assess Diastolic Fxn.  Severe R& L Atrial enlargement.  Moderate mitral regurgitation (posterior and lateral) mod RV dilation.  PAP ~ 66 mmHg.   Somewhere along the line she was switched from an ARB to lisinopril.  I do not know where or how this happened.  She had been on Avapro.  Interval History: Stephanie Franco presents here today actually overall feeling better.  Her itching has improved, her energy level is improved.  She is sleeping better.  Unfortunately, she continues to have lower extremity edema and also increased abdominal fullness.  Her weight is back up again with a goal weight of 226 pounds, she is 238 pounds.  She does note exertional dyspnea, but has not had any sensation of worsening A. fib either rapid irregular heartbeats.  She has some orthopnea and PND, but better than before.  She does  get short of breath bending over because of abdominal fullness.  Edema is still present and tense, but the itching is improved.  No syncope/near syncope or TIA/amaurosis fugax. No claudication. She seems to be handling the grieving over her son better.  Is on a more stable platform now.  Is ready to start getting back into doing some exercise in watching her diet.  Is hoping that she can get her weight down back to where she was prior to her hip surgery.  ROS: A comprehensive was performed. Review of Systems  Constitutional: Positive for  chills. Negative for malaise/fatigue (Energy is much better.) and weight loss.  HENT: Negative for ear discharge and nosebleeds.   Respiratory: Positive for shortness of breath.   Cardiovascular: Positive for palpitations, leg swelling (Still tense swelling) and PND. Negative for orthopnea.  Gastrointestinal: Positive for heartburn. Negative for blood in stool, melena, nausea and vomiting.       She notes increased abdominal fullness and distention, but does not feel bloated.  Genitourinary: Negative for dysuria and hematuria.  Musculoskeletal: Positive for joint pain (Her hip is doing great). Negative for falls.  Skin: Negative for itching (Notably improved.).  Neurological: Negative for dizziness, tingling, weakness (Body aches are doing much better.) and headaches.  Psychiatric/Behavioral: Negative for memory loss. The patient is not nervous/anxious and does not have insomnia (Sleeping much better.).        Seems to be dealing better now after the loss of her son.  All other systems reviewed and are negative.   I have reviewed and (if needed) personally updated the patient's problem list, medications, allergies, past medical and surgical history, social and family history.   Past Medical History:  Diagnosis Date  . Aortic atherosclerosis ([place])   . Arthritis   . Asthma   . Atrial fibrillation, permanent    Rate control with Bystolic. CHA2DS2Vasc = 6 (HTN, DM, CHF, Age 66, Female) -> on Pradaxa  . Breast cancer ([place]) 2008   S/P mastectomy  . CAD S/P percutaneous coronary angioplasty 2006;    PCI of circumflex with Taxus DES;; relook-cath Feb 2013: 50-60% short lesion in RCA, 40% ISR Circumflex stent.  . Cardiomegaly   . CKD (chronic kidney disease), stage III ([place])   . Complication of anesthesia    prolonged sedation after colonoscopy in [place]  . Diabetes mellitus, uncontrolled 07/14/2011  . Dilated cardiomyopathy ([place])    Mostly Resolved (EF up from ~25% to ~45% by Echo)  . DOE  (dyspnea on exertion)   . Edema of both legs    Usually mild, chronic. Controlled with when necessary furosemide and diet  . 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 GERD (gastroesophageal reflux disease)   . Gout   . Hyperlipidemia   . Hypertension   . Hypokalemia   . Morbid obesity (HCC)    BMI 41  . OSA on CPAP    On CPAP  . Pulmonary hypertension, unspecified ([place])    PAP ~90 mmHg on Echo 12/2017 -- has OSA on CPAP & obesity - but Overall Improved Sx of dyspnea / edema  . Systolic murmur     Past Surgical History:  Procedure Laterality Date  . COLONOSCOPY    . CORONARY ANGIOPLASTY WITH STENT PLACEMENT  2006   stent to circumflex  . DILATION AND CURETTAGE OF UTERUS    . LEFT HEART CATHETERIZATION WITH CORONARY ANGIOGRAM N/A 07/13/2011   Procedure: LEFT HEART CATHETERIZATION WITH CORONARY ANGIOGRAM;  Surgeon:  Leonie Man, MD;  Location: [facility] CATH LAB;  Service: Cardiovascular;  normal EF; 10m 50-60% lesion in RCA; patent stent in circumflex form 2006 with ~40% in-stent re-stenosis  . MASTECTOMY  2008   left  . NM MYOVIEW LTD  11/22/2014   Low Risk. No ischemia or infarction. Not gated 2/2  A. fib - unable to assess EF  . TOTAL HIP ARTHROPLASTY Right 01/19/2018   Procedure: RIGHT TOTAL HIP ARTHROPLASTY ANTERIOR APPROACH;  Surgeon: SRod Can MD;  Location: [facility] ORS;  Service: Orthopedics;  Laterality: Right;  Needs RNFA  . TRANSTHORACIC ECHOCARDIOGRAM  01/2015   LV EF 40-45%, Mild Anteroseptal HK. Mod-Severe  RA dilation with elevated PA Pressures (~~peak 63 mmHg).. Mild LA dilation  . TRANSTHORACIC ECHOCARDIOGRAM  June 2015   In setting of Urosepsis: EF ~25 %, global HK (poor quality study)  . TRANSTHORACIC ECHOCARDIOGRAM  12/2017; 04/15/2018   a) Improved LVEF to 45 to 50%.  Pulmonary HTN ~PAP ~ 60 mmHg. Severe LA dilation& mild /rv dilation.; b) Mod LVH. EF 40-45% (back to prior EF). Unable to assess DF (Afib). Severe R&L Atrial enlargement.  Moderate Post-Lateral  directed MR with Mod RV dilation.  PAP ~66 mmHg.;     Current Meds  Medication Sig  . albuterol (PROVENTIL) (2.5 MG/3ML) 0.083% nebulizer solution Take 3 mLs (2.5 mg total) by nebulization every 6 (six) hours as needed for wheezing or shortness of breath.  . allopurinol (ZYLOPRIM) 300 MG tablet Take 300 mg by mouth 2 (two) times daily.   .Marland Kitchenatorvastatin (LIPITOR) 40 MG tablet Take 1 tablet (40 mg total) by mouth daily.  . blood glucose meter kit and supplies KIT Dispense based on patient and insurance preference. Use up to four times daily as directed. (FOR ICD-9 250.00, 250.01).  . colchicine 0.6 MG tablet Take 0.6 mg by mouth every evening.   . docusate sodium (COLACE) 100 MG capsule Take 1 capsule (100 mg total) by mouth 2 (two) times daily.  .Marland KitchenELIQUIS 5 MG TABS tablet TAKE 1 TABLET BY MOUTH TWICE A DAY (Patient taking differently: Take 5 mg by mouth 2 (two) times daily. )  . ezetimibe (ZETIA) 10 MG tablet Take 1 tablet (10 mg total) by mouth daily. (Patient taking differently: Take 10 mg by mouth daily at 2 PM. )  . ferrous sulfate 325 (65 FE) MG EC tablet Take 325 mg by mouth daily with breakfast.  . fexofenadine (ALLEGRA) 180 MG tablet Take 180 mg by mouth daily as needed for allergies.   . fluticasone (FLONASE) 50 MCG/ACT nasal spray Place 2 sprays into the nose daily as needed for allergies or rhinitis.   .Marland Kitchengabapentin (NEURONTIN) 100 MG capsule Take 1 capsule (100 mg total) by mouth 3 (three) times daily.  .Marland Kitchenglucose blood (ACCU-CHEK SMARTVIEW) test strip 1 each by Other route as needed for other. Use as instructed  . HYDROcodone-acetaminophen (NORCO/VICODIN) 5-325 MG tablet Take 1-2 tablets by mouth every 6 (six) hours as needed (postop hip pain).  . hydrOXYzine (ATARAX/VISTARIL) 25 MG tablet Take 1-2 tablets (25-50 mg total) by mouth at bedtime as needed for itching.  . Insulin Glargine (TOUJEO SOLOSTAR) 300 UNIT/ML SOPN Inject 28 Units into the skin daily with supper.   . isosorbide  mononitrate (IMDUR) 60 MG 24 hr tablet Take 1 tablet (60 mg total) by mouth daily.  .Marland KitchenLORazepam (ATIVAN) 1 MG tablet Take 1 mg by mouth See admin instructions. Take 1 mg by mouth two times a day-  morning and evening  . Multiple Vitamins-Minerals (MULTIVITAMIN WITH MINERALS) tablet Take 1 tablet by mouth daily.  . nebivolol (BYSTOLIC) 10 MG tablet Take 1 tablet (10 mg total) by mouth daily.  . nitroGLYCERIN (NITROSTAT) 0.4 MG SL tablet DISSOLVE 1 TABLET (0.4 MG TOTAL) UNDER THE TONGUE EVERY 5 (FIVE) MINUTES AS NEEDED FOR CHEST PAIN. (Patient taking differently: Place 0.4 mg under the tongue every 5 (five) minutes as needed for chest pain. )  . ondansetron (ZOFRAN) 4 MG tablet Take 1 tablet (4 mg total) by mouth every 6 (six) hours as needed for nausea.  . pantoprazole (PROTONIX) 40 MG tablet Take 1 tablet (40 mg total) by mouth daily.  . potassium chloride SA (KLOR-CON M20) 20 MEQ tablet Take 1 tablet (20 mEq total) by mouth 2 (two) times daily. (Patient taking differently: Take 20 mEq by mouth See admin instructions. Take 20 mEq by mouth two times a day- morning and afternoon)  . senna (SENOKOT) 8.6 MG TABS tablet Take 1 tablet (8.6 mg total) by mouth 2 (two) times daily.  Marland Kitchen triamcinolone ointment (KENALOG) 0.5 % Apply to legs 2 times daily for 2 weeks (Patient taking differently: Apply 1 application topically See admin instructions. Apply to legs 2 times daily for 2 weeks)  . [DISCONTINUED] furosemide (LASIX) 80 MG tablet Take 1 tablet (80 mg total) by mouth 2 (two) times daily.    No Known Allergies  Social History   Tobacco Use  . Smoking status: Former Smoker    Packs/day: 0.50    Years: 6.00    Pack years: 3.00    Types: Cigarettes    Last attempt to quit: 05/18/1992    Years since quitting: 26.0  . Smokeless tobacco: Never Used  Substance Use Topics  . Alcohol use: No    Comment: 07/13/11 "have drank occasionally; not now"  . Drug use: No   Social History   Social History  Narrative   She is a married mother of 26, grandmother 2. Usually accompanied by her husband. She does not work. She had been working on her exercise, but is no longer as active. Does not drink and does not smoke    family history includes Atrial fibrillation in her son; Coronary artery disease in her mother; Hypertension in her mother.  Wt Readings from Last 3 Encounters:  05/26/18 238 lb (108 kg)  05/04/18 224 lb (101.6 kg)  05/02/18 228 lb 9.6 oz (103.7 kg)    PHYSICAL EXAM BP 103/75   Pulse 89   Ht 5' 3"  (1.6 m)   Wt 238 lb (108 kg)   BMI 42.16 kg/m  Physical Exam  Constitutional: She is oriented to person, place, and time. She appears well-developed. No distress (Just looks like she does not feel well).  Obese.  Less tired appearing.  Overall appears to look healthier than last visit.  HENT:  Head: Normocephalic and atraumatic.  Neck: Normal range of motion. Neck supple. Hepatojugular reflux and JVD (Roughly 10-12 cm water.) present. Carotid bruit is not present.  Cardiovascular: Normal rate, S1 normal and S2 normal. An irregularly irregular rhythm present. PMI is not displaced (Unable to palpate). Exam reveals distant heart sounds and decreased pulses (Because of edema). Exam reveals no gallop and no friction rub.  No murmur (1/6 HSM at RUSB.  Difficult to assess if holosystolic or ejection murmur.) heard. Pulmonary/Chest: Effort normal. No respiratory distress. She has no wheezes. She has no rales.  Decreased bibasal breath sounds with mild interstitial sounds  in the bases, but no rales or rhonchi.  Abdominal: Soft. Bowel sounds are normal. She exhibits no distension. There is no abdominal tenderness. There is no rebound.  Obese.  No HSM.  Does have appear to have increased abdominal fullness and distention from potential ascites.  Musculoskeletal: Normal range of motion.        General: Edema (At least 2 if not 3+ bilateral lower extremity edema it is very tense and firm.)  present.  Neurological: She is alert and oriented to person, place, and time.  Skin: Skin is warm and dry.  No obvious rash, but still exquisite sensitivity to touch in the legs.  Tense lower extremity edema.  Psychiatric: She has a normal mood and affect. Her behavior is normal. Judgment and thought content normal.  Notably less depressed or dejected appearing.  Vitals reviewed.   Adult ECG Report  Rate: 101 ;  Rhythm: atrial fibrillation and Rapid ventricular rate.  Left axis deviation.  At least one PVC noted.;   Narrative Interpretation: Heart rate is faster than usual.   Other studies Reviewed: Additional studies/ records that were reviewed today include:  Recent Labs:  Labs on December 6 showed creatinine 1.43, potassium 4.0.   ASSESSMENT / PLAN: Problem List Items Addressed This Visit    Acute on chronic combined systolic and diastolic CHF (congestive heart failure) ([place]) - Primary    Yet again back up to 238 pounds.  At least 12 pounds up from what she had previously been.  Despite the fact she is sleeping better and overall feeling better, she still has significant edema including abdominal swelling.  I am worried that this may be partly related to her pulmonary pretension and therefore MR. Plan for now is to titrate back up diuretic, allow her to get back into her exercise regimen and try to lose weight.  We will then reassess her in close follow-up.  At that time will determine whether or not we need to do further evaluation including right left heart catheterization and potential referral for evaluation for mitral clip.   (We will need to evaluate BMP and B MP today)  She is on high-dose ACE inhibitor and beta-blocker. Plan: Increase to 160 mg in the morning and 80 mg the evening Lasix for the next 3 days.  After that alternate every other day doing 160 mg in the morning and 80 mg in the evening versus 80 mg twice daily for the next 2 weeks. -->  We then discussed sliding scale  Lasix going forward in which she would use the 160 mg in the morning. If this does not work, would need to consider Zaroxolyn.  Her blood pressure will tolerate the use of spironolactone.      Relevant Medications   furosemide (LASIX) 80 MG tablet   Atrial fibrillation, permanent ([place]); CHA2DS2Vasc - 6 (Chronic)    Very much permanent A. fib.  Rate controlled now.  Better now that she is feeling better and sleeping better.  Anticoagulated with Eliquis.  May be making her heart failure worse.  Continue to monitor.  Unlikely to be a restore sinus rhythm.      Relevant Medications   furosemide (LASIX) 80 MG tablet   CAD S/P percutaneous coronary angioplasty (Chronic)    Distant history of MI with PCI.  Had mild troponin elevation in the setting of her CHF exacerbation and mildly reduced EF when compared to previous.  This is probably all within the realm of error based on  volume.  We will need to consider right and left heart catheterization if were not able to get her back controlled.  Also for part of evaluation for mitral valve repair by mitral clip. We will discuss her mitral valve with Dr. Burt Knack (will forward copy of this note to him for brief review)      Relevant Medications   furosemide (LASIX) 80 MG tablet   Chronic combined systolic and diastolic heart failure, NYHA class 2 (HCC) (Chronic)   Relevant Medications   furosemide (LASIX) 80 MG tablet   Other Relevant Orders   Brain natriuretic peptide (Completed)   Basic metabolic panel (Completed)   DOE (dyspnea on exertion) (Chronic)   Relevant Orders   Brain natriuretic peptide (Completed)   Basic metabolic panel (Completed)   Edema of both legs (Chronic)   Relevant Orders   Brain natriuretic peptide (Completed)   Basic metabolic panel (Completed)   Essential hypertension (Chronic)    Blood pressures look much better today, if not a little bit hypotensive on current dose of lisinopril and best tolerated.  Since she is  doing better and her heart failure seems to be stable, will simply continue current meds. Unable to tolerate spironolactone at this point.      Relevant Medications   furosemide (LASIX) 80 MG tablet   Hyperlipidemia with target LDL less than 70 (Chronic)    On statin and Zetia.  Most recent lipids from January of last year showed LDL of 61.  Would be due to follow-up now.  We can check if not available on follow-up.      Relevant Medications   furosemide (LASIX) 80 MG tablet   Moderate mitral regurgitation (Chronic)    Probably because of body habitus, I do not hear much of a murmur.  Definitely has moderate regurgitation noted on echo which may very well be ischemic in nature.  It could be related to volume overload.  She was relatively asymptomatic back in August, so we did not investigate further, however now with recurrent heart failure exacerbations, we may need to investigate further if not able to get her back under control. We will reassess in quick follow-up.  If abnormal at that time would need to consider right left heart catheterization.  Would also need to consider TEE if further evaluation is needed.      Relevant Medications   furosemide (LASIX) 80 MG tablet   Pulmonary HTN (HCC) (Chronic)   Relevant Medications   furosemide (LASIX) 80 MG tablet   Other Relevant Orders   Brain natriuretic peptide (Completed)   Basic metabolic panel (Completed)       I spent a total of 35 minutes with the patient and chart review. >  50% of the time was spent in direct patient consultation.    Current medicines are reviewed at length with the patient today.  (+/- concerns) needs something for itching and sleep The following changes have been made:  See below  Patient Instructions  Medication Instructions:    NEXT 3 DAYS - TAKE LASIX ( FUROSEMIDE) 2 TABLETS IN THE MORNING   (160 MG )  AND 80 MG IN THE EVENING , THEN ALTERNATE EVERY OTHER DAY -160 MG IN MORNING AND 80 IN EVENING,  NEXT DAY 80 MG TWICE A DAY FOR 2 WEEKS .   THE TARGET WEIGHT SHOULD BE 10 TO 12 LBS LESS THAN TODAY'S -- 238 LBS   If you need a refill on your cardiac medications before your next  appointment, please call your pharmacy.   Lab work: BNP BMP  If you have labs (blood work) drawn today and your tests are completely normal, you will receive your results only by: Marland Kitchen MyChart Message (if you have MyChart) OR . A paper copy in the mail If you have any lab test that is abnormal or we need to change your treatment, we will call you to review the results.  Testing/Procedures: NOT NEEDED  Follow-Up: At [facility], you and your health needs are our priority.  As part of our continuing mission to provide you with exceptional heart care, we have created designated Provider Care Teams.  These Care Teams include your primary Cardiologist (physician) and Advanced Practice Providers (APPs -  Physician Assistants and Nurse Practitioners) who all work together to provide you with the care you need, when you need it. . Your physician recommends that you schedule a follow-up appointment in 4 TO [place] .   Any Other Special Instructions Will Be Listed Below (If Applicable).    Studies Ordered:   Orders Placed This Encounter  Procedures  . Brain natriuretic peptide  . Basic metabolic panel   Lab Results  Component Value Date   CREATININE 1.58 (H) 05/26/2018   BUN 20 05/26/2018   NA 143 05/26/2018   K 3.9 05/26/2018   CL 104 05/26/2018   CO2 23 05/26/2018   BMP  325 today.   Stephanie Franco, M.D., M.S. Interventional Cardiologist   Pager # [phone] Phone # [phone] [address] [place], [place] [address]   Thank you for choosing Heartcare at [facility]!!

## 2018-05-26 NOTE — Patient Instructions (Addendum)
Medication Instructions:    NEXT 3 DAYS - TAKE LASIX ( FUROSEMIDE) 2 TABLETS IN THE MORNING   (160 MG )  AND 80 MG IN THE EVENING , THEN ALTERNATE EVERY OTHER DAY -160 MG IN MORNING AND 80 IN EVENING, NEXT DAY 80 MG TWICE A DAY FOR 2 WEEKS .   THE TARGET WEIGHT SHOULD BE 10 TO 12 LBS LESS THAN TODAY'S -- 238 LBS   If you need a refill on your cardiac medications before your next appointment, please call your pharmacy.   Lab work: BNP BMP  If you have labs (blood work) drawn today and your tests are completely normal, you will receive your results only by: Marland Kitchen MyChart Message (if you have MyChart) OR . A paper copy in the mail If you have any lab test that is abnormal or we need to change your treatment, we will call you to review the results.  Testing/Procedures: NOT NEEDED  Follow-Up: At [facility] you and your health needs are our priority.  As part of our continuing mission to provide you with exceptional heart care, we have created designated Provider Care Teams.  These Care Teams include your primary Cardiologist (physician) and Advanced Practice Providers (APPs -  Physician Assistants and Nurse Practitioners) who all work together to provide you with the care you need, when you need it. . Your physician recommends that you schedule a follow-up appointment in 4 TO [place] .   Any Other Special Instructions Will Be Listed Below (If Applicable).

## 2018-05-27 ENCOUNTER — Telehealth: Payer: Self-pay | Admitting: *Deleted

## 2018-05-27 ENCOUNTER — Telehealth: Payer: Self-pay | Admitting: Cardiology

## 2018-05-27 DIAGNOSIS — R7989 Other specified abnormal findings of blood chemistry: Secondary | ICD-10-CM

## 2018-05-27 DIAGNOSIS — R600 Localized edema: Secondary | ICD-10-CM

## 2018-05-27 DIAGNOSIS — R799 Abnormal finding of blood chemistry, unspecified: Secondary | ICD-10-CM

## 2018-05-27 DIAGNOSIS — I5032 Chronic diastolic (congestive) heart failure: Secondary | ICD-10-CM

## 2018-05-27 DIAGNOSIS — Z79899 Other long term (current) drug therapy: Secondary | ICD-10-CM

## 2018-05-27 LAB — BASIC METABOLIC PANEL
BUN/Creatinine Ratio: 13 (ref 12–28)
BUN: 20 mg/dL (ref 8–27)
CO2: 23 mmol/L (ref 20–29)
Calcium: 9 mg/dL (ref 8.7–10.3)
Chloride: 104 mmol/L (ref 96–106)
Creatinine, Ser: 1.58 mg/dL — ABNORMAL HIGH (ref 0.57–1.00)
GFR calc Af Amer: 38 mL/min/{1.73_m2} — ABNORMAL LOW (ref >59)
GFR calc non Af Amer: 33 mL/min/{1.73_m2} — ABNORMAL LOW (ref >59)
Glucose: 88 mg/dL (ref 65–99)
Potassium: 3.9 mmol/L (ref 3.5–5.2)
Sodium: 143 mmol/L (ref 134–144)

## 2018-05-27 LAB — BRAIN NATRIURETIC PEPTIDE: BNP: 325.4 pg/mL — ABNORMAL HIGH (ref 0.0–100.0)

## 2018-05-27 NOTE — Telephone Encounter (Signed)
Routed to [employer]

## 2018-05-27 NOTE — Telephone Encounter (Signed)
SPOKE TO HUSBAND --- AWARE  CONTINUE WITH MEDICATION LAB-- BMP NEEDED IN 4 WEEKS   ORDER PLACED

## 2018-05-27 NOTE — Telephone Encounter (Signed)
-----   Message from Leonie Man, MD sent at 05/27/2018  5:28 PM EST ----- Kidney function is better compared to last check. OK to do the Lasix as we discussed. We will want to recheck (BMP) in ~4 weeks.  Glenetta Hew, MD

## 2018-05-27 NOTE — Telephone Encounter (Signed)
Patient's spouse Jori Moll is calling about some medical equipment.

## 2018-05-27 NOTE — Telephone Encounter (Signed)
Returned call to patient's husband. Explained that [employer] faxed an order for rollator on 1/9 per documentation. He states that they will have to pay a copay for the rollator d/t upgrades and she may not be using it that often, so they now wish to pursue getting an electric scooter that she can drive around (like a hoveround) b/c this would be more useful to her in the future, he states. Advised that they should contact PCP about this, which he agrees to. He states if they wish to pursue getting the rollator again thru Dr. Ellyn Hack, they will let us know.

## 2018-05-29 ENCOUNTER — Encounter: Payer: Self-pay | Admitting: Cardiology

## 2018-05-29 NOTE — Assessment & Plan Note (Signed)
Yet again back up to 238 pounds.  At least 12 pounds up from what she had previously been.  Despite the fact she is sleeping better and overall feeling better, she still has significant edema including abdominal swelling.  I am worried that this may be partly related to her pulmonary pretension and therefore MR. Plan for now is to titrate back up diuretic, allow her to get back into her exercise regimen and try to lose weight.  We will then reassess her in close follow-up.  At that time will determine whether or not we need to do further evaluation including right left heart catheterization and potential referral for evaluation for mitral clip.   (We will need to evaluate BMP and B MP today)  She is on high-dose ACE inhibitor and beta-blocker. Plan: Increase to 160 mg in the morning and 80 mg the evening Lasix for the next 3 days.  After that alternate every other day doing 160 mg in the morning and 80 mg in the evening versus 80 mg twice daily for the next 2 weeks. -->  We then discussed sliding scale Lasix going forward in which she would use the 160 mg in the morning. If this does not work, would need to consider Zaroxolyn.  Her blood pressure will tolerate the use of spironolactone.

## 2018-05-29 NOTE — Assessment & Plan Note (Signed)
On statin and Zetia.  Most recent lipids from January of last year showed LDL of 61.  Would be due to follow-up now.  We can check if not available on follow-up.

## 2018-05-29 NOTE — Assessment & Plan Note (Signed)
Blood pressures look much better today, if not a little bit hypotensive on current dose of lisinopril and best tolerated.  Since she is doing better and her heart failure seems to be stable, will simply continue current meds. Unable to tolerate spironolactone at this point.

## 2018-05-29 NOTE — Assessment & Plan Note (Signed)
Very much permanent A. fib.  Rate controlled now.  Better now that she is feeling better and sleeping better.  Anticoagulated with Eliquis.  May be making her heart failure worse.  Continue to monitor.  Unlikely to be a restore sinus rhythm.

## 2018-05-29 NOTE — Assessment & Plan Note (Signed)
Distant history of MI with PCI.  Had mild troponin elevation in the setting of her CHF exacerbation and mildly reduced EF when compared to previous.  This is probably all within the realm of error based on volume.  We will need to consider right and left heart catheterization if were not able to get her back controlled.  Also for part of evaluation for mitral valve repair by mitral clip. We will discuss her mitral valve with Dr. Burt Knack (will forward copy of this note to him for brief review)

## 2018-05-29 NOTE — Assessment & Plan Note (Signed)
Probably because of body habitus, I do not hear much of a murmur.  Definitely has moderate regurgitation noted on echo which may very well be ischemic in nature.  It could be related to volume overload.  She was relatively asymptomatic back in August, so we did not investigate further, however now with recurrent heart failure exacerbations, we may need to investigate further if not able to get her back under control. We will reassess in quick follow-up.  If abnormal at that time would need to consider right left heart catheterization.  Would also need to consider TEE if further evaluation is needed.

## 2018-05-30 ENCOUNTER — Telehealth: Payer: Self-pay | Admitting: Cardiology

## 2018-05-30 MED ORDER — METOLAZONE 5 MG PO TABS: ORAL_TABLET | ORAL | 0 refills | Status: DC

## 2018-05-30 NOTE — Telephone Encounter (Signed)
Spoke with pt husband Jori Moll, [place] on file. Pt was seen on 05/26/18 by Dr.Harding and was instructed to increase her Lasix for 3 days 180mg  in the am and 80mg  in the pm. Today is day 3. Even with increase diuretic the pt weight has increased 4.5lb since Friday. The pt has not had any increase in her urine output. She has increased swelling in her belly.  Jori Moll sts that [place] mentioned another medication if the increased Lasix was not effective. Per Dr.Harding: Plan: Increase to 160 mg in the morning and 80 mg the evening Lasix for the next 3 days.  After that alternate every other day doing 160 mg in the morning and 80 mg in the evening versus 80 mg twice daily for the next 2 weeks. -->  We then discussed sliding scale Lasix going forward in which she would use the 160 mg in the morning. If this does not work, would need to consider Zaroxolyn.  Rickard Patience that [place] is working in the cath lab today. I will fwd an update to him for recommendation on Zaroxolyn dose and frequency and give them a call back with his instruction.

## 2018-05-30 NOTE — Telephone Encounter (Signed)
New Message        Pt c/o swelling: STAT is pt has developed SOB within 24 hours  1) How much weight have you gained and in what time span? 3 pds a day  2) If swelling, where is the swelling located? Stomach area  3) Are you currently taking a fluid pill? Yes  4) Are you currently SOB? No  5) Do you have a log of your daily weights (if so, list)? Yes  6) Have you gained 3 pounds in a day or 5 pounds in a week? Yes  7) Have you traveled recently? NO              Patient's husband states there is another pill that patient could take per Dr. Ellyn Hack, Pls advise

## 2018-05-30 NOTE — Telephone Encounter (Signed)
Agreed  [facility]

## 2018-05-30 NOTE — Telephone Encounter (Signed)
Multiple telephone encounters. See tel enc with the same date.

## 2018-05-30 NOTE — Telephone Encounter (Signed)
Follow up      Stephanie Franco from [facility]  [phone] calling in regarding swelling from increase of lasix from Dr. Ellyn Hack . Her weight has increased from 229.5 to 236.5 within a couple days . She stated that pt urine output has also decreased. She stated that she will fax over weight log to [facility] location

## 2018-05-30 NOTE — Telephone Encounter (Signed)
Duplicate telephone encounter. Please see other enc with the same date.

## 2018-05-30 NOTE — Telephone Encounter (Addendum)
Spoke with pt husband and made him aware of Dr.Harding's instruction.  Start Zaroxolyn 5mg  daily 56min before Lasix for 3 days Then take Zaroxolyn 5mg  daily 30 min before Lasix qod x1 week. Then take Zaroxolyn as needed for weight gain or increased edema. Rx sent for Zaroxolyn 5mg  #20 R-0 Continue increased Lasix 160mg  in the am and 80mg  in the pm for 2 more days. Then resume Laisx 80mg  bid.  Adv Mr.Kurowski to continue daily weights, limit sodium intake, contact the office if no increase in urine output or weight loss with the addition of Zaroxlyn. Pt husband verbalized understanding.

## 2018-05-30 NOTE — Telephone Encounter (Signed)
New Message   Pt c/o swelling: STAT is pt has developed SOB within 24 hours  1) How much weight have you gained and in what time span? 21/2lbs to 3lbs a day   2) If swelling, where is the swelling located? Yes, stomach   3) Are you currently taking a fluid pill? Yes, Furosemide 80mg  twice a day   4) Are you currently SOB? No  5) Do you have a log of your daily weights (if so, list)? Yes, 05/26/18 229 1/2, 05/27/18 232 1/2, 05/28/18 234, 05/29/18 234, 05/30/18 236 1/2  6) Have you gained 3 pounds in a day or 5 pounds in a week? Yes   7) Have you traveled recently? No

## 2018-06-01 DIAGNOSIS — Z96649 Presence of unspecified artificial hip joint: Secondary | ICD-10-CM | POA: Insufficient documentation

## 2018-06-07 ENCOUNTER — Telehealth: Payer: Self-pay | Admitting: Cardiology

## 2018-06-07 ENCOUNTER — Other Ambulatory Visit: Payer: Self-pay

## 2018-06-07 ENCOUNTER — Ambulatory Visit: Payer: Medicare Other | Admitting: Adult Health

## 2018-06-07 DIAGNOSIS — I5043 Acute on chronic combined systolic (congestive) and diastolic (congestive) heart failure: Secondary | ICD-10-CM

## 2018-06-07 DIAGNOSIS — I10 Essential (primary) hypertension: Secondary | ICD-10-CM

## 2018-06-07 NOTE — Telephone Encounter (Signed)
New Message   Pt c/o medication issue:  1. Name of Medication: furosemide (LASIX) 80 MG tablet   2. How are you currently taking this medication (dosage and times per day)?   3. Are you having a reaction (difficulty breathing--STAT)?   4. What is your medication issue? Patients spouse Jori Moll is calling on her behalf. He states that patient Lasix was increased because she was retaining fluid. But now she is not retaining fluid but has lost about 30lbs. He would like to know if the medication needs to be readjusted.

## 2018-06-07 NOTE — Telephone Encounter (Signed)
Stop Metolazone for now - unless needed PRN for wgt gain. We should get a BMP/BNP this week - may want to back down Lasix as well.  Glenetta Hew, MD

## 2018-06-07 NOTE — Telephone Encounter (Signed)
Spoke to patient's husband Dr.Harding's recommendation given.Stated he will bring wife to office Thurs 06/09/18 to have bmet and bnp.Advised after Dr.Harding reviews those results he may decrease Lasix dose.

## 2018-06-07 NOTE — Telephone Encounter (Signed)
Returned call to patient's husband.He stated wife is doing better.Stated her weight is down from 236 lbs to 200 lbs.Stated no swelling.No sob.He wanted to ask Dr.Harding if she needs to decrease one of her fluid pills.Stated she takes Metolazone 5 mg every other day and takes Lasix 80 mg twice a day.Advised I will send message to [place] for advice.

## 2018-06-07 NOTE — Telephone Encounter (Signed)
A message has already been sent to Stephanie Franco to address.  Thank you!

## 2018-06-07 NOTE — Telephone Encounter (Signed)
New Message   Stephanie Franco is calling because she says Dr Ellyn Hack started her on metolazone The pt has lost a total of 40.4 lbs in 8 days  She has no symptoms and complaints  Please call

## 2018-06-09 ENCOUNTER — Telehealth: Payer: Self-pay

## 2018-06-09 ENCOUNTER — Telehealth: Payer: Self-pay | Admitting: Cardiology

## 2018-06-09 ENCOUNTER — Other Ambulatory Visit: Payer: Self-pay | Admitting: Cardiology

## 2018-06-09 DIAGNOSIS — Z79899 Other long term (current) drug therapy: Secondary | ICD-10-CM

## 2018-06-09 DIAGNOSIS — E876 Hypokalemia: Secondary | ICD-10-CM

## 2018-06-09 DIAGNOSIS — I5032 Chronic diastolic (congestive) heart failure: Secondary | ICD-10-CM

## 2018-06-09 LAB — BASIC METABOLIC PANEL
BUN/Creatinine Ratio: 20 (ref 12–28)
BUN: 30 mg/dL — ABNORMAL HIGH (ref 8–27)
CO2: 32 mmol/L — AB (ref 20–29)
Calcium: 9.7 mg/dL (ref 8.7–10.3)
Chloride: 90 mmol/L — ABNORMAL LOW (ref 96–106)
Creatinine, Ser: 1.51 mg/dL — ABNORMAL HIGH (ref 0.57–1.00)
GFR calc Af Amer: 40 mL/min/{1.73_m2} — ABNORMAL LOW (ref >59)
GFR calc non Af Amer: 35 mL/min/{1.73_m2} — ABNORMAL LOW (ref >59)
Glucose: 104 mg/dL — ABNORMAL HIGH (ref 65–99)
Potassium: 2.6 mmol/L — CL (ref 3.5–5.2)
Sodium: 141 mmol/L (ref 134–144)

## 2018-06-09 MED ORDER — POTASSIUM CHLORIDE CRYS ER 20 MEQ PO TBCR: 20.0000 meq | EXTENDED_RELEASE_TABLET | Freq: Two times a day (BID) | ORAL | 11 refills | Status: DC

## 2018-06-09 NOTE — Telephone Encounter (Signed)
SPOKE TO DR HARDING , PER ORDER  TAKE  40 MEQ NOW AND LATER  TONIGHT. [place] on Monday- 06/13/18 CONTINUE LASIX 73 MG TWICE A DAY   NOTIFIED HUSBAND- ABOVE INFORMATION GIVEN. PATIENT'S HUSBAND VERBALIZED UNDERSTANDING .   E SENT NEW RX FOR QUANTITY OF POTASSIUM I have already ordered this as a future order in the computer. FOR BMP

## 2018-06-09 NOTE — Telephone Encounter (Signed)
[facility] calling with critical labs

## 2018-06-09 NOTE — Telephone Encounter (Signed)
See previous encounter

## 2018-06-09 NOTE — Telephone Encounter (Signed)
Received a call from [facility] with a critical potassium of 2.5. Will make MD aware.

## 2018-06-10 LAB — BRAIN NATRIURETIC PEPTIDE: BNP: 244.8 pg/mL — AB (ref 0.0–100.0)

## 2018-06-14 ENCOUNTER — Telehealth: Payer: Self-pay | Admitting: *Deleted

## 2018-06-14 LAB — BASIC METABOLIC PANEL
BUN/Creatinine Ratio: 20 (ref 12–28)
BUN: 30 mg/dL — ABNORMAL HIGH (ref 8–27)
CALCIUM: 10 mg/dL (ref 8.7–10.3)
CO2: 23 mmol/L (ref 20–29)
CREATININE: 1.49 mg/dL — AB (ref 0.57–1.00)
Chloride: 98 mmol/L (ref 96–106)
GFR calc Af Amer: 41 mL/min/{1.73_m2} — ABNORMAL LOW (ref >59)
GFR calc non Af Amer: 36 mL/min/{1.73_m2} — ABNORMAL LOW (ref >59)
Glucose: 155 mg/dL — ABNORMAL HIGH (ref 65–99)
Potassium: 4.1 mmol/L (ref 3.5–5.2)
Sodium: 139 mmol/L (ref 134–144)

## 2018-06-14 LAB — BRAIN NATRIURETIC PEPTIDE: BNP: 320.4 pg/mL — AB (ref 0.0–100.0)

## 2018-06-14 MED ORDER — METOLAZONE 5 MG PO TABS: ORAL_TABLET | ORAL | 4 refills | Status: DC

## 2018-06-14 NOTE — Telephone Encounter (Signed)
Spoke to patient's husband . Result given . Verbalized understanding Reviewed via mychart Request  rx for metolazone be refilled Routed to primary

## 2018-06-14 NOTE — Telephone Encounter (Signed)
-----   Message from Leonie Man, MD sent at 06/10/2018  4:33 PM EST ----- BNP level definitely has improved.  Goes along with wgt loss.  Glenetta Hew, MD

## 2018-06-14 NOTE — Telephone Encounter (Signed)
-----   Message from Leonie Man, MD sent at 06/13/2018  9:25 PM EST ----- Stephanie Franco news.  Potassium is now back to normal.  Kidney function is stable. We just need to make sure that whenever additional doses of diuretic are taken, that she takes additional dose of potassium.   Glenetta Hew, MD

## 2018-06-23 ENCOUNTER — Other Ambulatory Visit (HOSPITAL_COMMUNITY): Payer: Medicare Other

## 2018-07-01 ENCOUNTER — Other Ambulatory Visit: Payer: Self-pay

## 2018-07-07 ENCOUNTER — Ambulatory Visit (INDEPENDENT_AMBULATORY_CARE_PROVIDER_SITE_OTHER): Payer: Medicare Other | Admitting: Cardiology

## 2018-07-07 ENCOUNTER — Encounter: Payer: Self-pay | Admitting: Cardiology

## 2018-07-07 VITALS — BP 128/78 | HR 68 | Ht 63.0 in | Wt 199.8 lb

## 2018-07-07 DIAGNOSIS — I10 Essential (primary) hypertension: Secondary | ICD-10-CM | POA: Diagnosis not present

## 2018-07-07 DIAGNOSIS — I4821 Permanent atrial fibrillation: Secondary | ICD-10-CM

## 2018-07-07 DIAGNOSIS — I5043 Acute on chronic combined systolic (congestive) and diastolic (congestive) heart failure: Secondary | ICD-10-CM

## 2018-07-07 DIAGNOSIS — I2510 Atherosclerotic heart disease of native coronary artery without angina pectoris: Secondary | ICD-10-CM

## 2018-07-07 DIAGNOSIS — E785 Hyperlipidemia, unspecified: Secondary | ICD-10-CM

## 2018-07-07 DIAGNOSIS — Z9861 Coronary angioplasty status: Secondary | ICD-10-CM

## 2018-07-07 DIAGNOSIS — I5042 Chronic combined systolic (congestive) and diastolic (congestive) heart failure: Secondary | ICD-10-CM

## 2018-07-07 DIAGNOSIS — I420 Dilated cardiomyopathy: Secondary | ICD-10-CM

## 2018-07-07 DIAGNOSIS — E876 Hypokalemia: Secondary | ICD-10-CM

## 2018-07-07 DIAGNOSIS — E1169 Type 2 diabetes mellitus with other specified complication: Secondary | ICD-10-CM

## 2018-07-07 DIAGNOSIS — R600 Localized edema: Secondary | ICD-10-CM

## 2018-07-07 DIAGNOSIS — I340 Nonrheumatic mitral (valve) insufficiency: Secondary | ICD-10-CM

## 2018-07-07 NOTE — Patient Instructions (Signed)
Medication Instructions:  No changes If you need a refill on your cardiac medications before your next appointment, please call your pharmacy.   Lab work: Lipid cmp - do on a day before eating or drinking any food If you have labs (blood work) drawn today and your tests are completely normal, you will receive your results only by: Marland Kitchen MyChart Message (if you have MyChart) OR . A paper copy in the mail If you have any lab test that is abnormal or we need to change your treatment, we will call you to review the results.  Testing/Procedures: Not needed  Follow-Up: At [facility], you and your health needs are our priority.  As part of our continuing mission to provide you with exceptional heart care, we have created designated Provider Care Teams.  These Care Teams include your primary Cardiologist (physician) and Advanced Practice Providers (APPs -  Physician Assistants and Nurse Practitioners) who all work together to provide you with the care you need, when you need it. You will need a follow up appointment in 3 months.  Please call our office 2 months in advance to schedule this appointment.  You may see Glenetta Hew, MD or one of the following Advanced Practice Providers on your designated Care Team:   Rosaria Ferries, PA-C . Jory Sims, DNP, ANP  Any Other Special Instructions Will Be Listed Below (If Applicable).

## 2018-07-07 NOTE — Progress Notes (Signed)
PCP: Lin Landsman, MD  Clinic Note: Chief Complaint  Patient presents with  . Follow-up    Doing very well  . Congestive Heart Failure    Has diuresed over 35 pounds  . Coronary Artery Disease    No angina    HPI: Stephanie Franco is a 68 y.o. female with a PMH of Chronic Combined CHF (with recent prolonged exacerbation following the death of her sone) who presents for ~2 month f/u. She has had any history of Cardiomyopathy (EF roughly 45%, but was previously down to 25 -- presumably combination of both ischemic and nonischemic ) & PAF.  Last stress test was in 2016 when she had onset of A. fib. No evidence of ischemia or infarction.  Echo September 2016: EF was 40-45% with moderate pulmonary hypertension. Wall motion abnormality not explained by her known anatomy As of January 06, 2018 was doing well preop hip surgery.  She had lost a lot of weight.  Stable A. fib but no angina or heart failure.  No signs and symptoms suggesting worsening mitral valve.  [name] was last seen by me in clinic on May 02, 2018 as follow-up from her recent hospitalizations for heart failure and weight gain.  All this was.  Predicated on her getting out of her routine dealing with the death of her son and going to the funeral etc.  Had not been taking her medications as usual.  She had gained a lot of weight with swelling.  When I saw her she was miserable, noting edema, bilateral leg itching.  Not sleeping well.  Aching all over.  We made some adjustments with diuretics, but also like to give a trial run of steroids for her itching.  Unfortunately the steroids did lead to volume overload and she went into the emergency room for some IV Lasix.  We gave her a prescription for triamcinolone cream to use on her legs.  She was still noticing itching, and is now been getting some additional lotion to use.  Last seen May 26, 2018-- still volume up; increased diuretic dosing (with metolazone); had  HypoKalemia & mild AKI -- now stable Lab Results  Component Value Date   CREATININE 1.49 (H) 06/13/2018   BUN 30 (H) 06/13/2018   NA 139 06/13/2018   K 4.1 06/13/2018   CL 98 06/13/2018   CO2 23 06/13/2018    Recent Hospitalizations:   none  Studies Personally Reviewed - (if available, images/films reviewed: From Epic Chart or Care Everywhere)  none  Somewhere along the line she was switched from an ARB to lisinopril.  I do not know where or how this happened.  She had been on Avapro.  Interval History: Stephanie Franco presents here today doing amazingly well.  She is down 37 pounds since her last visit.  Some of this weight is probably actual weight loss, but the rest of is probably fluid.  They follow the instructions to fatigue when it came to using the metolazone and furosemide.  She is now using metolazone may be once or twice a week over the last couple weeks.  Up until then she was using it quite frequently.  Now she is feeling so much better.  Her energy level was better.  She is breathing better.  She is actually walking more now her legs do not hurt and her dyspnea has notably improved.  She is extremely happy.  She is ready to get back into her rehab for her hip.  She is no longer noticing any PND or orthopnea.  Very trivial edema. She is no longer noticing the rapid irregular heartbeat sensations that she was having when she was in the stages of grieving for her son.  Her itching is completely gone along with the edema.  No more episodes of chest tightness or pressure with rest or exertion.  No noticing the atrial fibrillation either rapid or irregular heartbeats.  No syncope or near syncope, no TIA or amaurosis fugax.  No claudication.  She says most of her energy is fully back and she is excited about getting back to her life.  She is ready to go on trips.  They have several trips planned including a long road trip over several days going up through [place] into the [facility] area.   In the next month or so they are planning got to [place] to see their grandchild.  ROS: A comprehensive was performed. Review of Systems  Constitutional: Negative for chills, malaise/fatigue (Energy is much better.) and weight loss.       Feels so much better.  Energy totally better.  HENT: Negative for ear discharge and nosebleeds.   Respiratory: Negative for cough and shortness of breath.   Cardiovascular: Negative for palpitations (Resolved), orthopnea, leg swelling (Essentially gone.  Has now gone back to wearing her support stockings.) and PND.  Gastrointestinal: Positive for heartburn. Negative for blood in stool, melena, nausea and vomiting.       She notes increased abdominal fullness and distention, but does not feel bloated.  Genitourinary: Negative for dysuria and hematuria.  Musculoskeletal: Positive for joint pain (Her hip is doing great). Negative for falls.  Skin: Negative for itching (Notably improved.).  Neurological: Negative for dizziness, tingling, weakness (Body aches are doing much better.) and headaches.  Psychiatric/Behavioral: Negative for memory loss. The patient is not nervous/anxious and does not have insomnia (Sleeping much better.).        Seems to be dealing better now after the loss of her son.  All other systems reviewed and are negative.   I have reviewed and (if needed) personally updated the patient's problem list, medications, allergies, past medical and surgical history, social and family history.   Past Medical History:  Diagnosis Date  . Aortic atherosclerosis ([place])   . Arthritis   . Asthma   . Atrial fibrillation, permanent    Rate control with Bystolic. CHA2DS2Vasc = 6 (HTN, DM, CHF, Age 30, Female) -> on Pradaxa  . Breast cancer ([place]) 2008   S/P mastectomy  . CAD S/P percutaneous coronary angioplasty 2006;    PCI of circumflex with Taxus DES;; relook-cath Feb 2013: 50-60% short lesion in RCA, 40% ISR Circumflex stent.  . Cardiomegaly   .  CKD (chronic kidney disease), stage III ([place])   . Complication of anesthesia    prolonged sedation after colonoscopy in [place]  . Diabetes mellitus, uncontrolled 07/14/2011  . Dilated cardiomyopathy ([place])    Mostly Resolved (EF up from ~25% to ~45% by Echo)  . DOE (dyspnea on exertion)   . Edema of both legs    Usually mild, chronic. Controlled with when necessary furosemide and diet  . 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 GERD (gastroesophageal reflux disease)   . Gout   . Hyperlipidemia   . Hypertension   . Hypokalemia   . Morbid obesity (HCC)    BMI 41  . OSA on CPAP    On CPAP  . Pulmonary hypertension,  unspecified ([place])    PAP ~90 mmHg on Echo 12/2017 -- has OSA on CPAP & obesity - but Overall Improved Sx of dyspnea / edema  . Systolic murmur     Past Surgical History:  Procedure Laterality Date  . COLONOSCOPY    . CORONARY ANGIOPLASTY WITH STENT PLACEMENT  2006   stent to circumflex  . DILATION AND CURETTAGE OF UTERUS    . LEFT HEART CATHETERIZATION WITH CORONARY ANGIOGRAM N/A 07/13/2011   Procedure: LEFT HEART CATHETERIZATION WITH CORONARY ANGIOGRAM;  Surgeon: Leonie Man, MD;  Location: [facility] CATH LAB;  Service: Cardiovascular;  normal EF; 20m 50-60% lesion in RCA; patent stent in circumflex form 2006 with ~40% in-stent re-stenosis  . MASTECTOMY  2008   left  . NM MYOVIEW LTD  11/22/2014   Low Risk. No ischemia or infarction. Not gated 2/2  A. fib - unable to assess EF  . TOTAL HIP ARTHROPLASTY Right 01/19/2018   Procedure: RIGHT TOTAL HIP ARTHROPLASTY ANTERIOR APPROACH;  Surgeon: SRod Can MD;  Location: [facility] ORS;  Service: Orthopedics;  Laterality: Right;  Needs RNFA  . TRANSTHORACIC ECHOCARDIOGRAM  01/2015   LV EF 40-45%, Mild Anteroseptal HK. Mod-Severe  RA dilation with elevated PA Pressures (~~peak 63 mmHg).. Mild LA dilation  . TRANSTHORACIC ECHOCARDIOGRAM  June 2015   In setting of Urosepsis: EF ~25 %, global HK (poor quality study)  . TRANSTHORACIC  ECHOCARDIOGRAM  12/2017; 04/15/2018   a) Improved LVEF to 45 to 50%.  Pulmonary HTN ~PAP ~ 60 mmHg. Severe LA dilation& mild /rv dilation.; b) Mod LVH. EF 40-45% (back to prior EF). Unable to assess DF (Afib). Severe R&L Atrial enlargement.  Moderate Post-Lateral directed MR with Mod RV dilation.  PAP ~66 mmHg.;     Current Meds  Medication Sig  . albuterol (PROVENTIL HFA;VENTOLIN HFA) 108 (90 Base) MCG/ACT inhaler Inhale 1-2 puffs into the lungs every 6 (six) hours as needed for wheezing or shortness of breath.  .Marland Kitchenalbuterol (PROVENTIL) (2.5 MG/3ML) 0.083% nebulizer solution Take 3 mLs (2.5 mg total) by nebulization every 6 (six) hours as needed for wheezing or shortness of breath.  . allopurinol (ZYLOPRIM) 300 MG tablet Take 300 mg by mouth 2 (two) times daily.   .Marland Kitchenatorvastatin (LIPITOR) 40 MG tablet Take 1 tablet (40 mg total) by mouth daily.  . blood glucose meter kit and supplies KIT Dispense based on patient and insurance preference. Use up to four times daily as directed. (FOR ICD-9 250.00, 250.01).  . colchicine 0.6 MG tablet Take 0.6 mg by mouth every evening.   . docusate sodium (COLACE) 100 MG capsule Take 1 capsule (100 mg total) by mouth 2 (two) times daily.  .Marland KitchenELIQUIS 5 MG TABS tablet TAKE 1 TABLET BY MOUTH TWICE A DAY (Patient taking differently: Take 5 mg by mouth 2 (two) times daily. )  . ezetimibe (ZETIA) 10 MG tablet Take 1 tablet (10 mg total) by mouth daily. (Patient taking differently: Take 10 mg by mouth daily at 2 PM. )  . ferrous sulfate 325 (65 FE) MG EC tablet Take 325 mg by mouth daily with breakfast.  . fexofenadine (ALLEGRA) 180 MG tablet Take 180 mg by mouth daily as needed for allergies.   . fluticasone (FLONASE) 50 MCG/ACT nasal spray Place 2 sprays into the nose daily as needed for allergies or rhinitis.   . furosemide (LASIX) 80 MG tablet TAKE 80 MG  TWICE A DAY ALSO MAY TAKE AN EXTRA 80 MG IN THE MORNING  AS NEEDED OR AS DIRECTED DAILY  . gabapentin (NEURONTIN)  100 MG capsule Take 1 capsule (100 mg total) by mouth 3 (three) times daily.  Marland Kitchen glucose blood (ACCU-CHEK SMARTVIEW) test strip 1 each by Other route as needed for other. Use as instructed  . HYDROcodone-acetaminophen (NORCO/VICODIN) 5-325 MG tablet Take 1-2 tablets by mouth every 6 (six) hours as needed (postop hip pain).  . hydrOXYzine (ATARAX/VISTARIL) 25 MG tablet Take 1-2 tablets (25-50 mg total) by mouth at bedtime as needed for itching.  . Insulin Glargine (TOUJEO SOLOSTAR) 300 UNIT/ML SOPN Inject 28 Units into the skin daily with supper.   . isosorbide mononitrate (IMDUR) 60 MG 24 hr tablet Take 1 tablet (60 mg total) by mouth daily.  Marland Kitchen lisinopril (PRINIVIL,ZESTRIL) 40 MG tablet Take 1 tablet (40 mg total) by mouth daily.  Marland Kitchen LORazepam (ATIVAN) 1 MG tablet Take 1 mg by mouth See admin instructions. Take 1 mg by mouth two times a day- morning and evening  . metolazone (ZAROXOLYN) 5 MG tablet Take 70mn before  Lasix daily as needed  . Multiple Vitamins-Minerals (MULTIVITAMIN WITH MINERALS) tablet Take 1 tablet by mouth daily.  . nebivolol (BYSTOLIC) 10 MG tablet Take 1 tablet (10 mg total) by mouth daily.  . nitroGLYCERIN (NITROSTAT) 0.4 MG SL tablet DISSOLVE 1 TABLET (0.4 MG TOTAL) UNDER THE TONGUE EVERY 5 (FIVE) MINUTES AS NEEDED FOR CHEST PAIN. (Patient taking differently: Place 0.4 mg under the tongue every 5 (five) minutes as needed for chest pain. )  . ondansetron (ZOFRAN) 4 MG tablet Take 1 tablet (4 mg total) by mouth every 6 (six) hours as needed for nausea.  . pantoprazole (PROTONIX) 40 MG tablet Take 1 tablet (40 mg total) by mouth daily.  . potassium chloride SA (KLOR-CON M20) 20 MEQ tablet Take 1 tablet (20 mEq total) by mouth 2 (two) times daily. May take an additional  40 meq as needed or as directed  . senna (SENOKOT) 8.6 MG TABS tablet Take 1 tablet (8.6 mg total) by mouth 2 (two) times daily.  .Marland Kitchentriamcinolone ointment (KENALOG) 0.5 % Apply to legs 2 times daily for 2 weeks  (Patient taking differently: Apply 1 application topically See admin instructions. Apply to legs 2 times daily for 2 weeks)    No Known Allergies  Social History   Tobacco Use  . Smoking status: Former Smoker    Packs/day: 0.50    Years: 6.00    Pack years: 3.00    Types: Cigarettes    Last attempt to quit: 05/18/1992    Years since quitting: 26.1  . Smokeless tobacco: Never Used  Substance Use Topics  . Alcohol use: No    Comment: 07/13/11 "have drank occasionally; not now"  . Drug use: No   Social History   Social History Narrative   She is a married mother of 482 grandmother 2. Usually accompanied by her husband. She does not work. She had been working on her exercise, but is no longer as active. Does not drink and does not smoke    family history includes Atrial fibrillation in her son; Coronary artery disease in her mother; Hypertension in her mother.  Wt Readings from Last 3 Encounters:  07/07/18 199 lb 12.8 oz (90.6 kg)  05/26/18 238 lb (108 kg)  05/04/18 224 lb (101.6 kg)  - At home 193 lb  PHYSICAL EXAM BP 128/78   Pulse 68   Ht 5' 3"  (1.6 m)   Wt 199 lb 12.8 oz (90.6  kg)   SpO2 96%   BMI 35.39 kg/m  Physical Exam  Constitutional: She is oriented to person, place, and time. She appears well-developed and well-nourished. No distress (Just looks like she does not feel well).  Obese, but much less so.  Looks happy and healthy.  HENT:  Head: Normocephalic and atraumatic.  Neck: Normal range of motion. Neck supple. No hepatojugular reflux and no JVD (Barely visible at roughly 6-8 cm water) present. Carotid bruit is not present.  Cardiovascular: Normal rate, S1 normal, S2 normal and intact distal pulses. An irregularly irregular rhythm present. PMI is not displaced (Unable to palpate). Exam reveals distant heart sounds. Exam reveals no gallop, no friction rub and no decreased pulses (Because of edema).  No murmur (1/6 HSM at RUSB.  Difficult to assess if holosystolic  or ejection murmur.) heard. Pulmonary/Chest: Effort normal. No respiratory distress. She has no wheezes. She has no rales.  Decreased bibasal breath sounds with mild interstitial sounds in the bases, but no rales or rhonchi.  Abdominal: Soft. Bowel sounds are normal. She exhibits no distension. There is no abdominal tenderness. There is no rebound.  Obese.  No HSM.  Does have appear to have increased abdominal fullness and distention from potential ascites.  Musculoskeletal: Normal range of motion.        General: No edema (Trivial).  Neurological: She is alert and oriented to person, place, and time.  Skin: Skin is warm and dry.  No rash.  No sensitivity to touch  Psychiatric: She has a normal mood and affect. Her behavior is normal. Judgment and thought content normal.  No more signs of depressed mood or dysthymia.  Vitals reviewed.   Adult ECG Report Not checked  Other studies Reviewed: Additional studies/ records that were reviewed today include:  Recent Labs:  Lab Results  Component Value Date   CREATININE 1.49 (H) 06/13/2018   BUN 30 (H) 06/13/2018   NA 139 06/13/2018   K 4.1 06/13/2018   CL 98 06/13/2018   CO2 23 06/13/2018   Creatinine, sodium and potassium all.  Stable now.  Lab Results  Component Value Date   CHOL 117 05/19/2017   HDL 36 (L) 05/19/2017   LDLCALC 61 05/19/2017   TRIG 113 05/19/2017   CHOLHDL 3.3 05/19/2017  Due for follow-up soon  ASSESSMENT / PLAN: Problem List Items Addressed This Visit    Acute on chronic combined systolic and diastolic CHF (congestive heart failure) ([place]) - Primary    Finally she seems to have finally resolved her heart failure symptoms.  She has lost 39 pounds, most of which is probably fluid.  Her dry weight at home is 193 pounds which is pretty accurate now and she is maintaining this weight 3 pounds using sliding scale Lasix plus/minus metolazone as previously directed. Her potassium is and creatinine levels have  stabilized out. She is high on high-dose ACE inhibitor along with Bystolic.  She also is on Imdur at stable dose.  We spent quite a bit of time discussing how well this whole outpatient diuresis worked.  She (and more importantly her husband) now understands better how the sliding scale or works with increasing Lasix dose and using Zaroxolyn off and on.  They also realize it is okay to hold a dose of Lasix if she is can be traveling etc. I did recommend that she continue to use support stockings for maintenance, especially when she is going on these road trips.      CAD S/P  percutaneous coronary angioplasty (Chronic)    Distant history of PCI back in 2005.  Had a follow-up cath in 2013 that showed patent stent and otherwise normal coronary arteries.  Myoview in 2017 was negative.  Has not had any real anginal symptoms.  Had mild troponin elevation in setting of CHF that was probably volume/heart failure related and not ACS.  For now in the absence of symptoms following diuresis, I do not think we need to consider another ischemic evaluation.  However if we are going to evaluate worsening pulmonary pretension or much regurgitation, would require right and left heart cath.      Relevant Orders   Comprehensive metabolic panel   Chronic combined systolic and diastolic heart failure (HCC)   Relevant Orders   Comprehensive metabolic panel   Dilated cardiomyopathy ([place]) (Chronic)    Her EF seems to be relatively stable on echocardiogram.  She is on a good dose of ARB and beta-blocker.  Now on high-dose furosemide with as needed Zaroxolyn/metolazone.  Tolerating sliding scale well. Requires potassium supplementation.      Edema of lower extremity (Chronic)    She usually does only have mild amount of edema, but up until this most recent evaluation had had significant edema since Thanksgiving timeframe.  Notably improved after diuresis. Controlled now with furosemide, diet and PRN Zaroxolyn plus  or minus increased dose of furosemide.  (Sliding scale)  Also recommend wearing support stockings and elevate feet.      Essential hypertension (Chronic)    Stable pressures today.  No change      Hyperlipidemia associated with type 2 diabetes mellitus (HCC) (Chronic)    She is due to have a lipid panel checked along with chemistries that were following because of her diuresis.  She remains on Zetia and atorvastatin, and has not had labs checked since last year.  Her HDL and LDL that time look pretty good.  Total cholesterol is also quite excellent. Hopefully she stays stable.      Relevant Orders   Lipid panel   Comprehensive metabolic panel   Hypokalemia    She did have some hypokalemia in response to overdiuresis.  She also had mild bump in creatinine.  She is due for having her chemistries checked along with lipids.  Most recently potassium and sodium are stable along with creatinine.      Moderate mitral regurgitation (Chronic)    I do think this is probably related to volume overload.  Now that she is stable, I want to give her a little bit time away from studies, but do think that reevaluating later on this year would be reasonable. The fact that she is gotten much better with diuresis would suggest that the MRI is not significant, and was probably functional.  Plan to recheck echocardiogram after another few months of stability.      Morbid obesity ([place]) (Chronic)    After all the time it took her to try to lose weight to get her hip done, now her weight is down below 100 which is what she had wanted to get to preoperatively. I do think that some of her weight loss is related to her being more active, but a good portion is also diuresis. The plan now is to keep this weight stable.  I have definitely congratulated her on her efforts.      Permanent atrial fibrillation: CHA2DS2-VASc Score 6 (Chronic)    Pretty much permanent A. fib now.  Rate controlled.  Asymptomatic.  On  stable dose of Bystolic.  Anticoagulated with Eliquis.         I spent a total of 40-45 minutes with the patient and chart review. >  50% of the time was spent in direct patient consultation.    Current medicines are reviewed at length with the patient today.  (+/- concerns):  The following changes have been made:  See below  Patient Instructions  Medication Instructions:  No changes If you need a refill on your cardiac medications before your next appointment, please call your pharmacy.   Lab work: Lipid cmp - do on a day before eating or drinking any food If you have labs (blood work) drawn today and your tests are completely normal, you will receive your results only by: Marland Kitchen MyChart Message (if you have MyChart) OR . A paper copy in the mail If you have any lab test that is abnormal or we need to change your treatment, we will call you to review the results.  Testing/Procedures: Not needed  Follow-Up: At [facility], you and your health needs are our priority.  As part of our continuing mission to provide you with exceptional heart care, we have created designated Provider Care Teams.  These Care Teams include your primary Cardiologist (physician) and Advanced Practice Providers (APPs -  Physician Assistants and Nurse Practitioners) who all work together to provide you with the care you need, when you need it. You will need a follow up appointment in 3 months.  Please call our office 2 months in advance to schedule this appointment.  You may see Glenetta Hew, MD or one of the following Advanced Practice Providers on your designated Care Team:   Rosaria Ferries, PA-C . Jory Sims, DNP, ANP  Any Other Special Instructions Will Be Listed Below (If Applicable).    Studies Ordered:   Orders Placed This Encounter  Procedures  . Lipid panel  . Comprehensive metabolic panel   Lab Results  Component Value Date   CREATININE 1.49 (H) 06/13/2018   BUN 30 (H) 06/13/2018    NA 139 06/13/2018   K 4.1 06/13/2018   CL 98 06/13/2018   CO2 23 06/13/2018   BMP  325 today.   Glenetta Hew, M.D., M.S. Interventional Cardiologist   Pager # [phone] Phone # [phone] [address] [place], [place] [address]   Thank you for choosing Heart[facility] at [facility]!!

## 2018-07-09 ENCOUNTER — Encounter: Payer: Self-pay | Admitting: Cardiology

## 2018-07-09 NOTE — Assessment & Plan Note (Signed)
She usually does only have mild amount of edema, but up until this most recent evaluation had had significant edema since Thanksgiving timeframe.  Notably improved after diuresis. Controlled now with furosemide, diet and PRN Zaroxolyn plus or minus increased dose of furosemide.  (Sliding scale)  Also recommend wearing support stockings and elevate feet.

## 2018-07-09 NOTE — Assessment & Plan Note (Signed)
She did have some hypokalemia in response to overdiuresis.  She also had mild bump in creatinine.  She is due for having her chemistries checked along with lipids.  Most recently potassium and sodium are stable along with creatinine.

## 2018-07-09 NOTE — Assessment & Plan Note (Signed)
She is due to have a lipid panel checked along with chemistries that were following because of her diuresis.  She remains on Zetia and atorvastatin, and has not had labs checked since last year.  Her HDL and LDL that time look pretty good.  Total cholesterol is also quite excellent. Hopefully she stays stable.

## 2018-07-09 NOTE — Assessment & Plan Note (Signed)
Distant history of PCI back in 2005.  Had a follow-up cath in 2013 that showed patent stent and otherwise normal coronary arteries.  Myoview in 2017 was negative.  Has not had any real anginal symptoms.  Had mild troponin elevation in setting of CHF that was probably volume/heart failure related and not ACS.  For now in the absence of symptoms following diuresis, I do not think we need to consider another ischemic evaluation.  However if we are going to evaluate worsening pulmonary pretension or much regurgitation, would require right and left heart cath.

## 2018-07-09 NOTE — Assessment & Plan Note (Signed)
Her EF seems to be relatively stable on echocardiogram.  She is on a good dose of ARB and beta-blocker.  Now on high-dose furosemide with as needed Zaroxolyn/metolazone.  Tolerating sliding scale well. Requires potassium supplementation.

## 2018-07-09 NOTE — Assessment & Plan Note (Signed)
I do think this is probably related to volume overload.  Now that she is stable, I want to give her a little bit time away from studies, but do think that reevaluating later on this year would be reasonable. The fact that she is gotten much better with diuresis would suggest that the MRI is not significant, and was probably functional.  Plan to recheck echocardiogram after another few months of stability.

## 2018-07-09 NOTE — Assessment & Plan Note (Signed)
After all the time it took her to try to lose weight to get her hip done, now her weight is down below 100 which is what she had wanted to get to preoperatively. I do think that some of her weight loss is related to her being more active, but a good portion is also diuresis. The plan now is to keep this weight stable.  I have definitely congratulated her on her efforts.

## 2018-07-09 NOTE — Assessment & Plan Note (Signed)
Finally she seems to have finally resolved her heart failure symptoms.  She has lost 39 pounds, most of which is probably fluid.  Her dry weight at home is 193 pounds which is pretty accurate now and she is maintaining this weight 3 pounds using sliding scale Lasix plus/minus metolazone as previously directed. Her potassium is and creatinine levels have stabilized out. She is high on high-dose ACE inhibitor along with Bystolic.  She also is on Imdur at stable dose.  We spent quite a bit of time discussing how well this whole outpatient diuresis worked.  She (and more importantly her husband) now understands better how the sliding scale or works with increasing Lasix dose and using Zaroxolyn off and on.  They also realize it is okay to hold a dose of Lasix if she is can be traveling etc. I did recommend that she continue to use support stockings for maintenance, especially when she is going on these road trips.

## 2018-07-09 NOTE — Assessment & Plan Note (Signed)
Pretty much permanent A. fib now.  Rate controlled.  Asymptomatic.  On stable dose of Bystolic.  Anticoagulated with Eliquis.

## 2018-07-09 NOTE — Assessment & Plan Note (Signed)
Stable pressures today.  No change

## 2018-07-26 ENCOUNTER — Other Ambulatory Visit: Payer: Self-pay | Admitting: Cardiology

## 2018-08-19 ENCOUNTER — Other Ambulatory Visit: Payer: Self-pay | Admitting: Cardiology

## 2018-08-19 MED ORDER — ISOSORBIDE MONONITRATE ER 60 MG PO TB24: 60.0000 mg | ORAL_TABLET | Freq: Every day | ORAL | 6 refills | Status: DC

## 2018-08-19 NOTE — Telephone Encounter (Signed)
Isosorbide sent to pharmacy.

## 2018-08-19 NOTE — Telephone Encounter (Signed)
°*  STAT* If patient is at the pharmacy, call can be transferred to refill team.   1. Which medications need to be refilled? (please list name of each medication and dose if known) isosorbide mononitrate (IMDUR) 60 MG 24 hr tablet  2. Which pharmacy/location (including street and city if local pharmacy) is medication to be sent to? [id number] [place] [address]  3. Do they need a 30 day or 90 day supply? 30 days   Patient is out of meds

## 2018-08-19 NOTE — Telephone Encounter (Signed)
Follow Up    Pt is out of Medication and Husband is calling to check on the Refill

## 2018-09-26 ENCOUNTER — Telehealth: Payer: Self-pay | Admitting: Cardiology

## 2018-09-26 ENCOUNTER — Telehealth: Payer: Self-pay | Admitting: *Deleted

## 2018-09-26 ENCOUNTER — Encounter: Payer: Self-pay | Admitting: Cardiology

## 2018-09-26 ENCOUNTER — Telehealth (INDEPENDENT_AMBULATORY_CARE_PROVIDER_SITE_OTHER): Payer: Medicare Other | Admitting: Cardiology

## 2018-09-26 VITALS — BP 116/66 | HR 62 | Ht 63.0 in | Wt 192.0 lb

## 2018-09-26 DIAGNOSIS — I2510 Atherosclerotic heart disease of native coronary artery without angina pectoris: Secondary | ICD-10-CM

## 2018-09-26 DIAGNOSIS — I420 Dilated cardiomyopathy: Secondary | ICD-10-CM | POA: Diagnosis not present

## 2018-09-26 DIAGNOSIS — I4821 Permanent atrial fibrillation: Secondary | ICD-10-CM

## 2018-09-26 DIAGNOSIS — I5042 Chronic combined systolic (congestive) and diastolic (congestive) heart failure: Secondary | ICD-10-CM

## 2018-09-26 DIAGNOSIS — I340 Nonrheumatic mitral (valve) insufficiency: Secondary | ICD-10-CM

## 2018-09-26 DIAGNOSIS — I10 Essential (primary) hypertension: Secondary | ICD-10-CM

## 2018-09-26 NOTE — Assessment & Plan Note (Signed)
Blood pressure looks great today, and she apparently is no longer on lisinopril.  At this rate, I would not restart it unless pressure trends to go up.

## 2018-09-26 NOTE — Telephone Encounter (Signed)
Spoke to husband , virtual visit  Completed today

## 2018-09-26 NOTE — Assessment & Plan Note (Addendum)
Distant PCI back in 2006 with stent noted to be patent in 2013 with otherwise normal coronary arteries.  Negative Myoview in 2017. Not having any anginal symptoms.  She is currently on statin/Zetia and beta-blocker; not on aspirin or Plavix because of Eliquis for A. Fib.  No need for further ischemic evaluation at this time, unless mitral regurgitation continues to be of concern by follow-up echo.Marland Kitchen

## 2018-09-26 NOTE — Telephone Encounter (Signed)
New message   Patient's husband states that a link has not been received for the virtual visit today. Please call.

## 2018-09-26 NOTE — Assessment & Plan Note (Addendum)
With significant weight loss, she is now down in the moderate obesity category.  BMI down to 34.  I continue to congratulate her on her weight loss efforts.  She is hoping to lose another 10 pounds within the next 6 months or so. This is been a major benefit after having her hip surgery.  She did take the backward slide because of her heart failure exacerbation.  Now that this is better, she is hoping to get back walking as the weather turns nicer.  She admittedly has not been eating as well as she should.  We will need to closely follow her diet.

## 2018-09-26 NOTE — Assessment & Plan Note (Signed)
I suspect that this may have been functional MR related to volume overload.  Seems relatively asymptomatic with it now.  Plan now that she has been adequately diuresed and is on stable volume control, we will recheck echocardiogram once safe from a COVID-19 standpoint.  Further evaluation only based on echo results. Based on symptoms, I would suspect that has improved.

## 2018-09-26 NOTE — Assessment & Plan Note (Signed)
Significant improved symptoms.  Slightly reduced EF by most recent echo.  This was in the setting of volume overload.  She was on a good dose of ARB and beta-blocker then was changed to lisinopril, now no longer on lisinopril.  Simply on beta-blocker and diuretic along with Imdur.  Plan is to reassess EF and pressures by echo.

## 2018-09-26 NOTE — Progress Notes (Signed)
Virtual Visit via Video Note   This visit type was conducted due to national recommendations for restrictions regarding the COVID-19 Pandemic (e.g. social distancing) in an effort to limit this patient's exposure and mitigate transmission in our community.  Due to her co-morbid illnesses, this patient is at least at moderate risk for complications without adequate follow up.  This format is felt to be most appropriate for this patient at this time.  All issues noted in this document were discussed and addressed.  A limited physical exam was performed with this format.  Please refer to the patient's chart for her consent to telehealth for [facility].   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 fib, CAD PCI  History of Present Illness:    Stephanie Franco a 68y.o. female with PMH notable for permanent A. fib, chronic combined CHF, CAD-PCI with most recent complication being exacerbation of CHF who presents via audio/video conferencing for a telehealth visit today.  Stephanie Franco last seen in February 2020 --this was the last of several visits beginning back in Thanksgiving timeframe of 2019 when she had some significant CHF exacerbation revolving around [place].  A lot of this was related to depression).  Symptoms of CHF seem to have resolved by February after aggressive diuresis..  She had lost 39 pounds (mostly fluid  volume).  Her dry weight was 193 pounds.  Was doing sliding scale Lasix with occasional metolazone.  Potassium and renal function had stabilized. -Echo showed stable EF.  She had been switched from ARB to lisinopril for unclear reason.  Interval History:  [name].  Keeping stable weights.  In great spirits.  Stable weights - ~191-192 - maybe takes Zaroxolyn 1-2 x /week.  Only mild pedal edema - depending upon diet (& indiscretion).  NO PND/orthopnea. Not doing as well with diet & has limited exercise. Hopes to get back to walking a few times/week. NO dyspnea with rest or even exertion. May note irregular heart beats if she gest agitated.  Cardiovascular ROS: no chest pain or dyspnea on exertion positive for - mild edema or increases in weight negative for - irregular heartbeat, orthopnea, palpitations, paroxysmal nocturnal dyspnea, rapid heart rate, shortness of breath or syncope/near syncope; TIA/amaurosis fugax; claudication; melena, hematochezia, hematuria, epistaxis, bleeding/bruising  The patient does not have symptoms concerning for COVID-19 infection (fever, chills, cough, or new shortness of breath).  The patient is practicing social distancing.  Store - ~1 x per week. Wear mask & gloves.   ROS:  Please see the history of present illness.    Review of Systems  Constitutional: Negative for malaise/fatigue and weight loss.  HENT: Negative for congestion and nosebleeds.   Respiratory: Negative for shortness of breath.   Cardiovascular: Negative for chest pain, palpitations, orthopnea, claudication and PND.       Per HPI  Gastrointestinal: Negative for blood in stool, melena, nausea and vomiting.  Genitourinary: Positive for  frequency (with diuretic). Negative for dysuria and hematuria.  Musculoskeletal: Positive for joint pain (OA pains; R hip doing well post-op). Negative for falls and myalgias.  Neurological: Negative for dizziness, focal weakness, weakness and  headaches.       Takes Neurontin for PN pain - usually with weight gain  Psychiatric/Behavioral: Negative for memory loss. The patient is not nervous/anxious and does not have insomnia.   All other systems reviewed and are negative.   Past Medical History:  Diagnosis Date  . Aortic atherosclerosis ([place])   . Arthritis   . Asthma   . Atrial fibrillation, permanent    Rate control with Bystolic. CHA2DS2Vasc = 6 (HTN, DM, CHF, Age 20, Female) -> on Pradaxa  . Breast cancer ([place]) 2008   S/P mastectomy  . CAD S/P percutaneous coronary angioplasty 2006;    PCI of circumflex with Taxus DES;; relook-cath Feb 2013: 50-60% short lesion in RCA, 40% ISR Circumflex stent.  . Cardiomegaly   . CKD (chronic kidney disease), stage III ([place])   . Complication of anesthesia    prolonged sedation after colonoscopy in [place]  . Diabetes mellitus, uncontrolled 07/14/2011  . Dilated cardiomyopathy ([place])    Mostly Resolved (EF up from ~25% to ~45% by Echo)  . DOE (dyspnea on exertion)   . Edema of both legs    Usually mild, chronic. Controlled with when necessary furosemide and diet  . 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 GERD (gastroesophageal reflux disease)   . Gout   . Hyperlipidemia   . Hypertension   . Hypokalemia   . Morbid obesity (HCC)    BMI 41  . OSA on CPAP    On CPAP  . Pulmonary hypertension, unspecified ([place])    PAP ~90 mmHg on Echo 12/2017 -- has OSA on CPAP & obesity - but Overall Improved Sx of dyspnea / edema  . Systolic murmur     Past Surgical History:  Procedure Laterality Date  . COLONOSCOPY    . CORONARY ANGIOPLASTY WITH STENT PLACEMENT  2006   stent to circumflex  . DILATION AND CURETTAGE OF UTERUS    . LEFT HEART CATHETERIZATION WITH CORONARY ANGIOGRAM N/A 07/13/2011   Procedure: LEFT HEART CATHETERIZATION WITH CORONARY ANGIOGRAM;  Surgeon: Leonie Man, MD;  Location: [facility] CATH LAB;  Service: Cardiovascular;  normal EF; 76m 50-60% lesion in RCA; patent stent in  circumflex form 2006 with ~40% in-stent re-stenosis  . MASTECTOMY  2008   left  . NM MYOVIEW LTD  11/22/2014   Low Risk. No ischemia or infarction. Not gated 2/2  A. fib - unable to assess EF  . TOTAL HIP ARTHROPLASTY Right 01/19/2018   Procedure: RIGHT TOTAL HIP ARTHROPLASTY ANTERIOR APPROACH;  Surgeon: SRod Can MD;  Location: [facility] ORS;  Service: Orthopedics;  Laterality: Right;  Needs RNFA  . TRANSTHORACIC ECHOCARDIOGRAM  01/2015   LV EF 40-45%, Mild Anteroseptal HK. Mod-Severe  RA dilation with elevated PA Pressures (~~peak 63 mmHg).. Mild LA dilation  . TRANSTHORACIC ECHOCARDIOGRAM  June 2015   In setting of Urosepsis: EF ~25 %, global HK (poor quality study)  . TRANSTHORACIC ECHOCARDIOGRAM  12/2017; 04/15/2018   a) Improved LVEF to 45 to 50%.  Pulmonary HTN ~PAP ~ 60 mmHg. Severe LA dilation& mild /rv dilation.; b) Mod LVH. EF 40-45% (back to prior EF). Unable to assess DF (Afib). Severe R&L Atrial enlargement.  Moderate Post-Lateral directed MR with Mod RV dilation.  PAP ~66 mmHg.;  Current Meds  Medication Sig  . albuterol (PROVENTIL HFA;VENTOLIN HFA) 108 (90 Base) MCG/ACT inhaler Inhale 1-2 puffs into the lungs every 6 (six) hours as needed for wheezing or shortness of breath.  Marland Kitchen albuterol (PROVENTIL) (2.5 MG/3ML) 0.083% nebulizer solution Take 3 mLs (2.5 mg total) by nebulization every 6 (six) hours as needed for wheezing or shortness of breath.  . allopurinol (ZYLOPRIM) 300 MG tablet Take 300 mg by mouth 2 (two) times daily.   Marland Kitchen atorvastatin (LIPITOR) 40 MG tablet Take 1 tablet (40 mg total) by mouth daily.  . blood glucose meter kit and supplies KIT Dispense based on patient and insurance preference. Use up to four times daily as directed. (FOR ICD-9 250.00, 250.01).  . colchicine 0.6 MG tablet Take 0.6 mg by mouth every evening.   Marland Kitchen ELIQUIS 5 MG TABS tablet TAKE 1 TABLET BY MOUTH TWICE A DAY (Patient taking differently: Take 5 mg by mouth 2 (two) times daily. )  . ezetimibe  (ZETIA) 10 MG tablet Take 1 tablet (10 mg total) by mouth daily. (Patient taking differently: Take 10 mg by mouth daily at 2 PM. )  . ferrous sulfate 325 (65 FE) MG EC tablet Take 325 mg by mouth daily with breakfast.  . fexofenadine (ALLEGRA) 180 MG tablet Take 180 mg by mouth daily as needed for allergies.   . fluticasone (FLONASE) 50 MCG/ACT nasal spray Place 2 sprays into the nose daily as needed for allergies or rhinitis.   . furosemide (LASIX) 80 MG tablet TAKE 80 MG  TWICE A DAY ALSO MAY TAKE AN EXTRA 80 MG IN THE MORNING AS NEEDED OR AS DIRECTED DAILY  . gabapentin (NEURONTIN) 100 MG capsule Take 1 capsule (100 mg total) by mouth 3 (three) times daily.  Marland Kitchen glucose blood (ACCU-CHEK SMARTVIEW) test strip 1 each by Other route as needed for other. Use as instructed  . HYDROcodone-acetaminophen (NORCO/VICODIN) 5-325 MG tablet Take 1-2 tablets by mouth every 6 (six) hours as needed (postop hip pain).  . Insulin Glargine (TOUJEO SOLOSTAR) 300 UNIT/ML SOPN Inject 28 Units into the skin daily with supper.   . isosorbide mononitrate (IMDUR) 60 MG 24 hr tablet Take 1 tablet (60 mg total) by mouth daily.  Marland Kitchen LORazepam (ATIVAN) 1 MG tablet Take 1 mg by mouth See admin instructions. Take 1 mg by mouth two times a day- morning and evening  . metolazone (ZAROXOLYN) 5 MG tablet Take 61mn before  Lasix daily as needed  . Multiple Vitamins-Minerals (MULTIVITAMIN WITH MINERALS) tablet Take 1 tablet by mouth daily.  . nebivolol (BYSTOLIC) 10 MG tablet Take 1 tablet (10 mg total) by mouth daily.  . nitroGLYCERIN (NITROSTAT) 0.4 MG SL tablet DISSOLVE 1 TABLET (0.4 MG TOTAL) UNDER THE TONGUE EVERY 5 (FIVE) MINUTES AS NEEDED FOR CHEST PAIN. (Patient taking differently: Place 0.4 mg under the tongue every 5 (five) minutes as needed for chest pain. )  . ondansetron (ZOFRAN) 4 MG tablet Take 1 tablet (4 mg total) by mouth every 6 (six) hours as needed for nausea.  . pantoprazole (PROTONIX) 40 MG tablet TAKE 1 TABLET BY  MOUTH EVERY DAY  . potassium chloride SA (KLOR-CON M20) 20 MEQ tablet Take 1 tablet (20 mEq total) by mouth 2 (two) times daily. May take an additional  40 meq as needed or as directed  . triamcinolone ointment (KENALOG) 0.5 % Apply to legs 2 times daily for 2 weeks (Patient taking differently: Apply 1 application topically See admin instructions. Apply to legs  2 times daily for 2 weeks)    -- No longer on ACE-I (Lisinopril)  Allergies:   Patient has no known allergies.   Social History   Tobacco Use  . Smoking status: Former Smoker    Packs/day: 0.50    Years: 6.00    Pack years: 3.00    Types: Cigarettes    Last attempt to quit: 05/18/1992    Years since quitting: 26.3  . Smokeless tobacco: Never Used  Substance Use Topics  . Alcohol use: No    Comment: 07/13/11 "have drank occasionally; not now"  . Drug use: No     Family Hx: The patient's family history includes Atrial fibrillation in her son; Coronary artery disease in her mother; Hypertension in her mother.   Prior CV studies:   The following studies were reviewed today: . No new studies.  Last echo from November 2019 showed EF 40 to 45% with moderate LVH and diffuse HK.  Unable to assess diastolic function due to A. fib.  Dilated IVC suggestive of elevated filling pressures--peak PA pressure 66 mmHg.  This was a slightly reduced EF compared to August (likely related to volume overload)  Labs/Other Tests and Data Reviewed:    EKG:  No ECG reviewed.  Recent Labs: 04/16/2018: TSH 5.153 04/20/2018: Magnesium 1.8 05/04/2018: ALT 33; Hemoglobin 8.9; Platelets 254 06/13/2018: BNP 320.4; BUN 30; Creatinine, Ser 1.49; Potassium 4.1; Sodium 139   Recent Lipid Panel Lab Results  Component Value Date/Time   CHOL 117 05/19/2017 08:15 AM   TRIG 113 05/19/2017 08:15 AM   HDL 36 (L) 05/19/2017 08:15 AM   CHOLHDL 3.3 05/19/2017 08:15 AM   LDLCALC 61 05/19/2017 08:15 AM    Wt Readings from Last 3 Encounters:  09/26/18 192 lb  (87.1 kg)  07/07/18 199 lb 12.8 oz (90.6 kg)  05/26/18 238 lb (108 kg)     Objective:    Vital Signs:  BP 116/66   Pulse 62   Ht 5' 3" (1.6 m)   Wt 192 lb (87.1 kg)   BMI 34.01 kg/m   VITAL SIGNS:  reviewed GEN:  Well nourished, well developed female in no acute distress. RESPIRATORY:  normal respiratory effort, symmetric expansion CARDIOVASCULAR:  no peripheral edema & no JVD. NEURO:  alert and oriented x 3, no obvious focal deficit PSYCH:  normal affect   ASSESSMENT & PLAN:    Problem List Items Addressed This Visit    CAD S/P percutaneous coronary angioplasty (Chronic)    Distant PCI back in 2006 with stent noted to be patent in 2013 with otherwise normal coronary arteries.  Negative Myoview in 2017. Not having any anginal symptoms.  She is currently on statin/Zetia and beta-blocker; not on aspirin or Plavix because of Eliquis for A. Fib.  No need for further ischemic evaluation at this time, unless mitral regurgitation continues to be of concern by follow-up echo..      Relevant Orders   Brain natriuretic peptide   Chronic combined systolic and diastolic heart failure (HCC) (Chronic)    Back down to may be class I-II symptoms.  Again mostly diastolic dysfunction related to A. fib and now with pulmonary hypertension. I wonder if her mitral regurgitation was partly functional in the setting of A. fib and volume overload.  She is now down over 45 pounds from January after aggressive diuresis. Doing very well with sliding scale using standing dose of Lasix plus PRN Zaroxolyn. She is on Bystolic and Imdur, no longer on lisinopril (  not sure when this was stopped, but for now with her current blood pressure were okay.)  Plan: Recheck 2D echocardiogram as soon as safe based on COVID-19 restrictions.  This will be to reassess EF but also the mitral valve. We will also check BNP at that time along with CMP to track her renal function and potassium level.      Relevant Orders    Comprehensive metabolic panel   Brain natriuretic peptide   ECHOCARDIOGRAM COMPLETE   Dilated cardiomyopathy (HCC) - Primary (Chronic)    Significant improved symptoms.  Slightly reduced EF by most recent echo.  This was in the setting of volume overload.  She was on a good dose of ARB and beta-blocker then was changed to lisinopril, now no longer on lisinopril.  Simply on beta-blocker and diuretic along with Imdur.  Plan is to reassess EF and pressures by echo.      Relevant Orders   Comprehensive metabolic panel   Brain natriuretic peptide   ECHOCARDIOGRAM COMPLETE   Essential hypertension (Chronic)    Blood pressure looks great today, and she apparently is no longer on lisinopril.  At this rate, I would not restart it unless pressure trends to go up.      Moderate mitral regurgitation (Chronic)    I suspect that this may have been functional MR related to volume overload.  Seems relatively asymptomatic with it now.  Plan now that she has been adequately diuresed and is on stable volume control, we will recheck echocardiogram once safe from a COVID-19 standpoint.  Further evaluation only based on echo results. Based on symptoms, I would suspect that has improved.      Relevant Orders   Brain natriuretic peptide   ECHOCARDIOGRAM COMPLETE   Morbid obesity (HCC) (Chronic)    With significant weight loss, she is now down in the moderate obesity category.  BMI down to 34.  I continue to congratulate her on her weight loss efforts.  She is hoping to lose another 10 pounds within the next 6 months or so. This is been a major benefit after having her hip surgery.  She did take the backward slide because of her heart failure exacerbation.  Now that this is better, she is hoping to get back walking as the weather turns nicer.  She admittedly has not been eating as well as she should.  We will need to closely follow her diet.      Permanent atrial fibrillation: CHA2DS2-VASc Score 6  (Chronic)    Remains asymptomatic and rate controlled in permanent A. fib.  On stable dose of Bystolic with adequate rate control. No bleeding issues on Eliquis.      Relevant Orders   Comprehensive metabolic panel   ECHOCARDIOGRAM COMPLETE      COVID-19 Education: The signs and symptoms of COVID-19 were discussed with the patient and how to seek care for testing (follow up with PCP or arrange E-visit).   The importance of social distancing was discussed today.  Time:   Today, I have spent 35 minutes with the patient with telehealth technology discussing the above problems.     Medication Adjustments/Labs and Tests Ordered: Current medicines are reviewed at length with the patient today.  Concerns regarding medicines are outlined above.  Medication Instructions: No changes  Tests Ordered: Orders Placed This Encounter  Procedures  . Comprehensive metabolic panel  . Brain natriuretic peptide  . ECHOCARDIOGRAM COMPLETE    Medication Changes: No orders of the defined types  were placed in this encounter. -- no changes  Disposition:  Follow up in 4 month(s)    Signed, Stephanie Hew, MD  09/26/2018 4:52 PM    [facility]

## 2018-09-26 NOTE — Patient Instructions (Addendum)
Medication Instructions:  NO CHANGE WITH MEDICAITONS If you need a refill on your cardiac medications before your next appointment, please call your pharmacy.   Lab work: CMP , BNP - YOU CAN DO WHEN YOU HAVE ECHO-- GO TO THE LAB ON THE FIRST FLOOR OF THE [address]  [address]  If you have labs (blood work) drawn today and your tests are completely normal, you will receive your results only by: Marland Kitchen MyChart Message (if you have MyChart) OR . A paper copy in the mail If you have any lab test that is abnormal or we need to change your treatment, we will call you to review the results.  Testing/Procedures: WILL SCHEDULE AT [place] [address]- due July OR AUG 2020 Your physician has requested that you have an echocardiogram. Echocardiography is a painless test that uses sound waves to create images of your heart. It provides your doctor with information about the size and shape of your heart and how well your heart's chambers and valves are working. This procedure takes approximately one hour. There are no restrictions for this procedure.    Follow-Up: At [facility], you and your health needs are our priority.  As part of our continuing mission to provide you with exceptional heart care, we have created designated Provider Care Teams.  These Care Teams include your primary Cardiologist (physician) and Advanced Practice Providers (APPs -  Physician Assistants and Nurse Practitioners) who all work together to provide you with the care you need, when you need it. . You will need a follow up appointment in  6 months  OCT 2020.  Please call our office 2 months in advance to schedule this appointment.  You may see Glenetta Hew, MD or one of the following Advanced Practice Providers on your designated Care Team:   . Rosaria Ferries, PA-C . Jory Sims, DNP, ANP  Any Other Special Instructions Will Be Listed Below (If Applicable).

## 2018-09-26 NOTE — Assessment & Plan Note (Signed)
Back down to may be class I-II symptoms.  Again mostly diastolic dysfunction related to A. fib and now with pulmonary hypertension. I wonder if her mitral regurgitation was partly functional in the setting of A. fib and volume overload.  She is now down over 45 pounds from January after aggressive diuresis. Doing very well with sliding scale using standing dose of Lasix plus PRN Zaroxolyn. She is on Bystolic and Imdur, no longer on lisinopril (not sure when this was stopped, but for now with her current blood pressure were okay.)  Plan: Recheck 2D echocardiogram as soon as safe based on COVID-19 restrictions.  This will be to reassess EF but also the mitral valve. We will also check BNP at that time along with CMP to track her renal function and potassium level.

## 2018-09-26 NOTE — Assessment & Plan Note (Signed)
Remains asymptomatic and rate controlled in permanent A. fib.  On stable dose of Bystolic with adequate rate control. No bleeding issues on Eliquis.

## 2018-09-26 NOTE — Telephone Encounter (Signed)
Spoke to patient and husband. Instruction of televisit  From 09/26/18- av summary will be sent via mychart - labslip will be mailed. Both voiced understanding.

## 2018-09-29 ENCOUNTER — Ambulatory Visit: Payer: Medicare Other | Admitting: Cardiology

## 2018-10-04 IMAGING — DX DG PORTABLE PELVIS
1 series · 1 of 1 positions shown · non-contrast
Comparison: Intraoperative images obtained earlier same day at [DATE]
a.m. through [DATE] a.m..

CLINICAL DATA: Postop day 0 ANTERIOR approach RIGHT total hip
arthroplasty.

EXAM:
PORTABLE PELVIS 1-2 VIEWS [DATE] p.m.:

[pelvis ap]
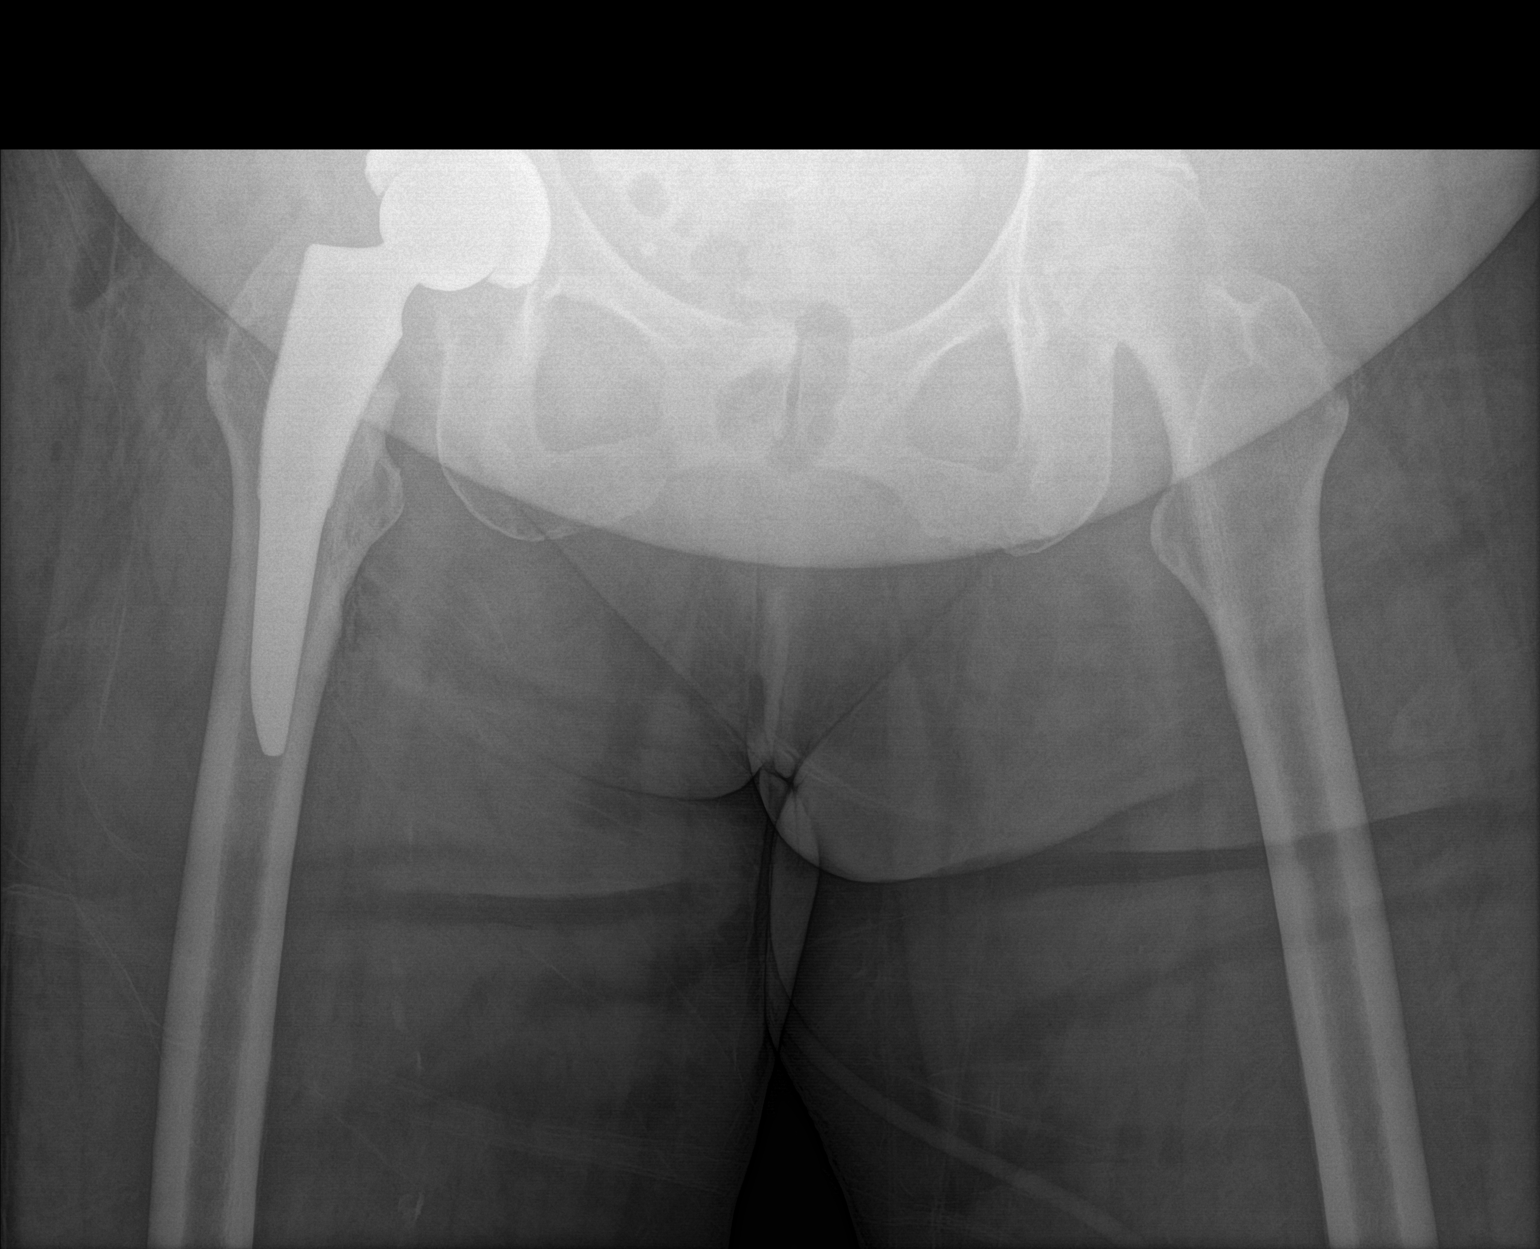

[1 of 1 positions shown; findings below may reference images not displayed]

FINDINGS: Anatomic alignment of the RIGHT hip prosthesis in the AP projection.
No acute complicating features. Note is made of moderate to severe
joint space narrowing involving the contralateral LEFT hip.
IMPRESSION: Anatomic alignment of the RIGHT hip prosthesis in the AP projection
without acute complicating features.

## 2018-11-01 ENCOUNTER — Other Ambulatory Visit: Payer: Self-pay | Admitting: Cardiology

## 2018-11-01 MED ORDER — ELIQUIS 5 MG PO TABS: 5.0000 mg | ORAL_TABLET | Freq: Two times a day (BID) | ORAL | 2 refills | Status: DC

## 2018-11-01 NOTE — Telephone Encounter (Signed)
New Message    *STAT* If patient is at the pharmacy, call can be transferred to refill team.   1. Which medications need to be refilled? (please list name of each medication and dose if known)   ELIQUIS 5 MG TABS tablet     2. Which pharmacy/location (including street and city if local pharmacy) is medication to be sent to? [id number] [place], [place] [address]. AT [address]  3. Do they need a 30 day or 90 day supply? [place]

## 2018-11-01 NOTE — Telephone Encounter (Signed)
Rx has been sent to the pharmacy electronically. ° °

## 2018-12-11 ENCOUNTER — Other Ambulatory Visit: Payer: Self-pay | Admitting: Cardiology

## 2018-12-27 ENCOUNTER — Other Ambulatory Visit: Payer: Self-pay | Admitting: Cardiology

## 2018-12-29 IMAGING — CR DG CHEST 2V
2 series · 2 of 2 positions shown · non-contrast
Comparison: Prior radiograph from 02/20/2017.

CLINICAL DATA: Initial evaluation for extremity swelling, shortness
of breath.

EXAM:
CHEST - 2 VIEW

[w chest lat]
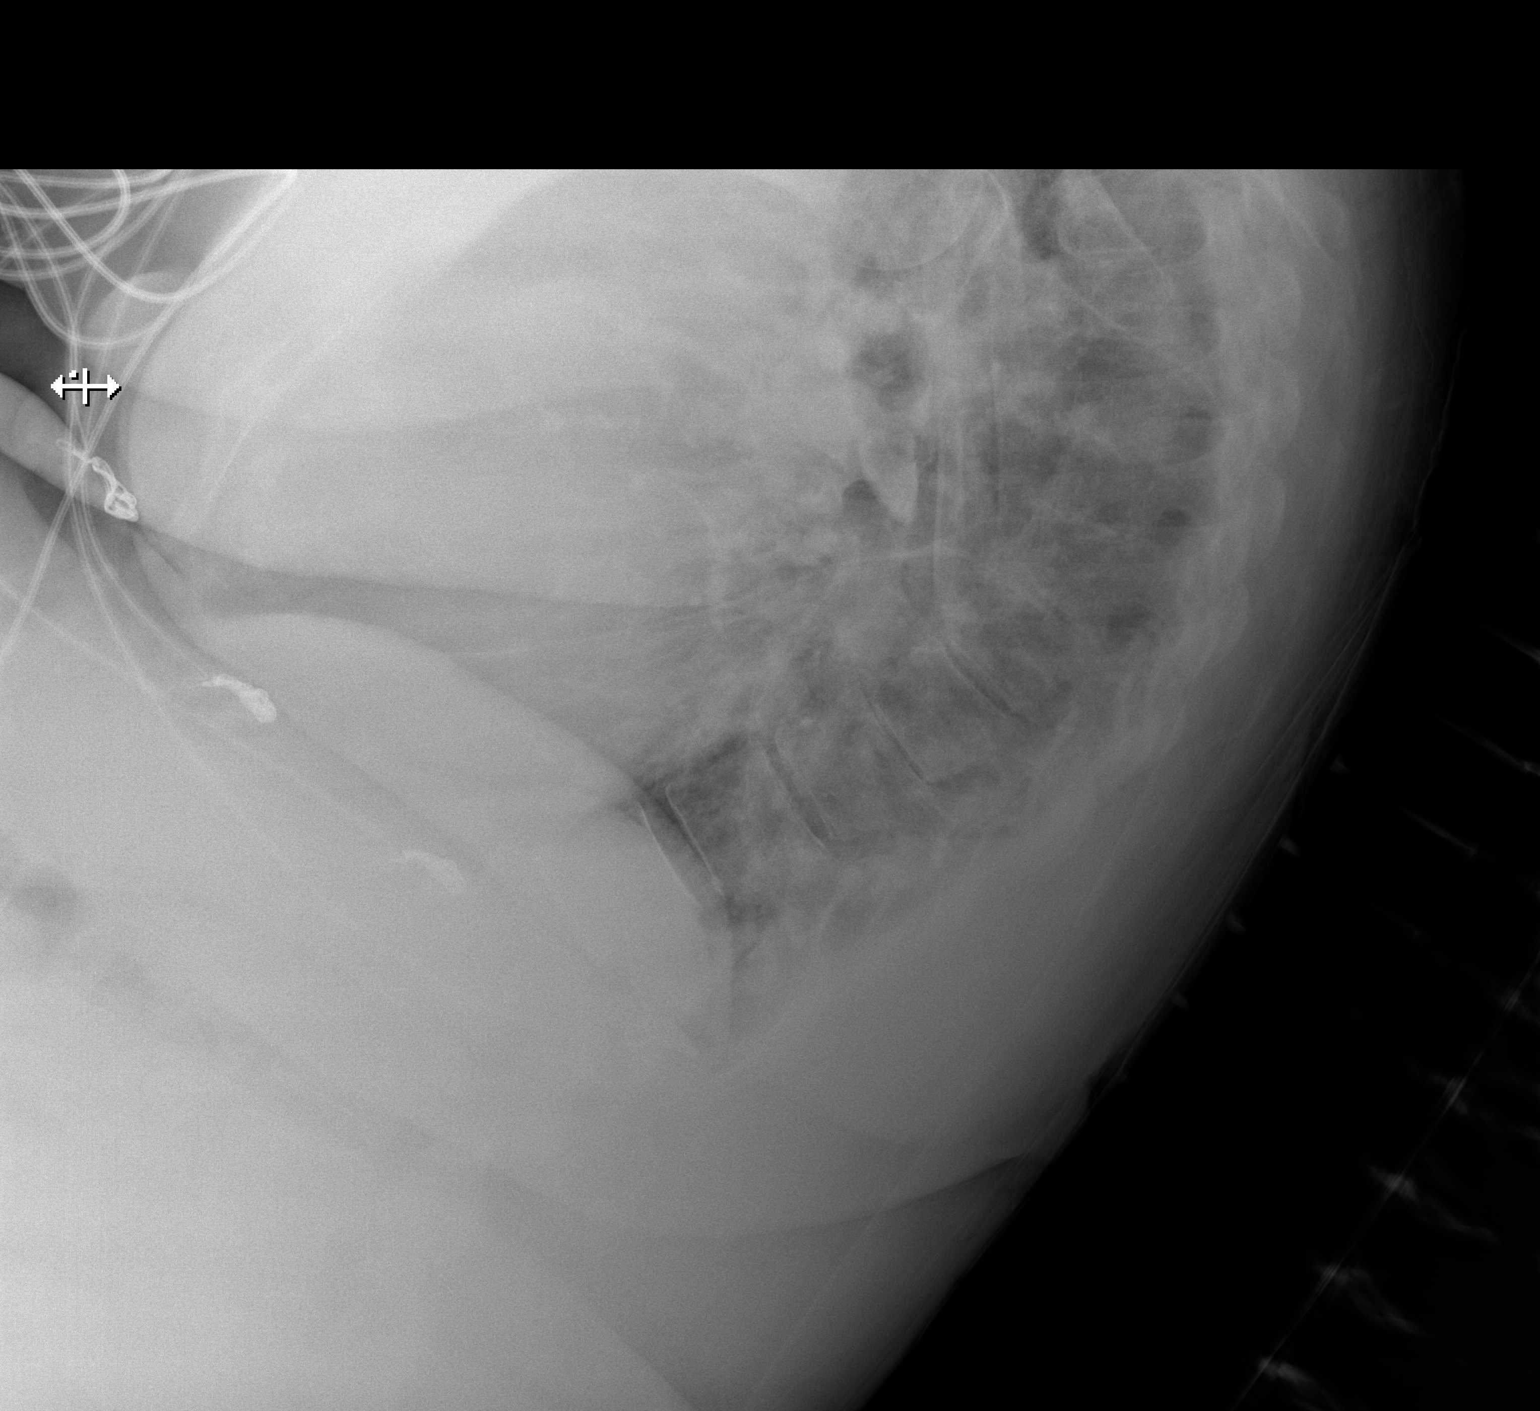

[x chest ap]
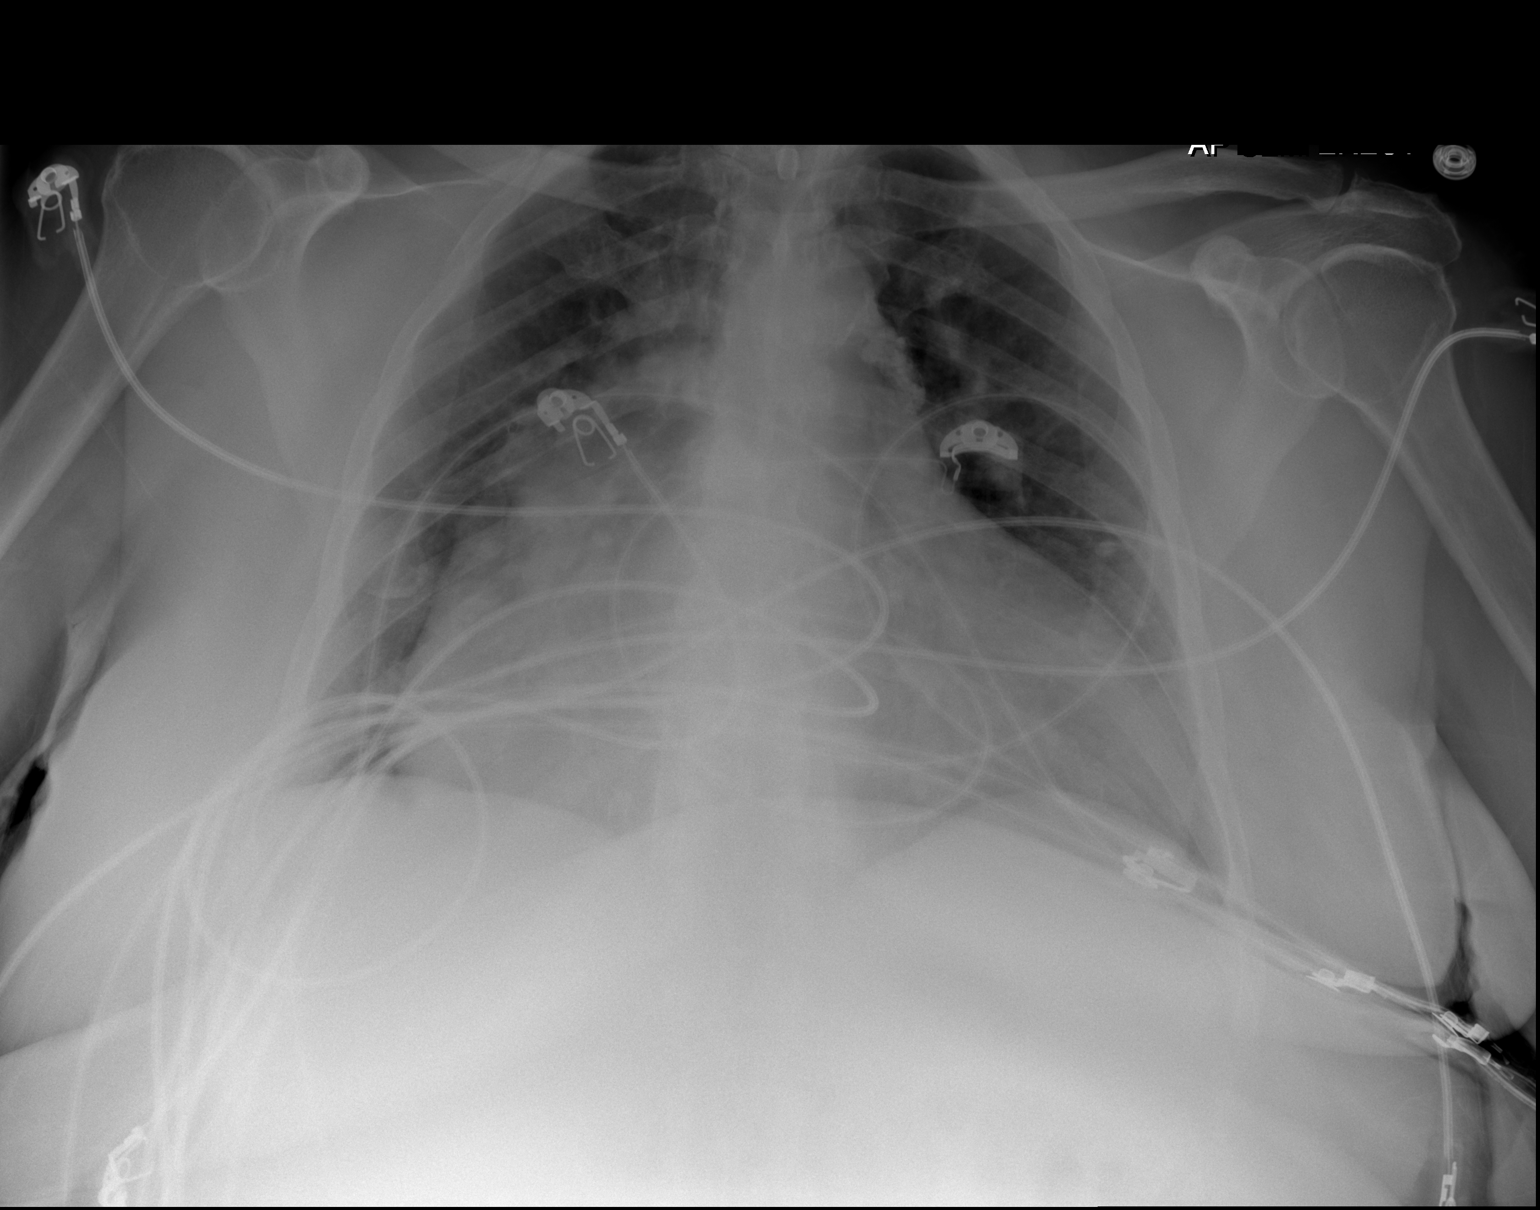

[2 of 2 positions shown; findings below may reference images not displayed]

FINDINGS: Moderate to severe cardiomegaly, similar to previous. Mediastinal
silhouette normal. Aortic atherosclerosis. Calcified left hilar
adenopathy noted, stable.

Lungs mildly hypoinflated. Perihilar vascular congestion without
overt pulmonary edema. No focal infiltrates. No pleural effusion. No
pneumothorax.

No acute osseous abnormality.
IMPRESSION: 1. Cardiomegaly with perihilar vascular congestion without frank
pulmonary edema.
2. Aortic atherosclerosis.

## 2018-12-30 ENCOUNTER — Other Ambulatory Visit: Payer: Self-pay | Admitting: Cardiology

## 2019-01-17 HISTORY — PX: TRANSTHORACIC ECHOCARDIOGRAM: SHX275

## 2019-01-25 ENCOUNTER — Telehealth: Payer: Self-pay | Admitting: Nurse Practitioner

## 2019-01-25 NOTE — Telephone Encounter (Signed)
   I received a call from [place] re: critically low K of 2.8.  Creat also mildly elevated above baseline.  Office note reviewed.  Significant diuresis and wt loss of late.  I called and left message on pts cell phone with request for call back and also advised to take an additional 15meq of KCl tonight, in addition to her regularly scheduled dose.  Awaiting call back @ this time.  Lab Results  Component Value Date   CREATININE 1.75 (H) 01/25/2019   BUN 44 (H) 01/25/2019   NA 142 01/25/2019   K 2.8 (LL) 01/25/2019   CL 97 01/25/2019   CO2 27 01/25/2019     Murray Hodgkins, NP 01/25/2019, 6:24 PM

## 2019-01-25 NOTE — Telephone Encounter (Signed)
Ivin Booty - can you help Gerald Stabs f/u with this.  Glenetta Hew, MD

## 2019-01-25 NOTE — Telephone Encounter (Signed)
OK - should probably back off to 1/2 diuretic for ~2-3 days & go back to normal dosing.  Needs recheck by Friday - BMP.  Glenetta Hew, MD  - forwarding to Trixie Dredge, RN

## 2019-01-26 ENCOUNTER — Ambulatory Visit (HOSPITAL_COMMUNITY): Payer: Medicare Other | Attending: Cardiology

## 2019-01-26 ENCOUNTER — Other Ambulatory Visit: Payer: Self-pay | Admitting: Cardiology

## 2019-01-26 ENCOUNTER — Other Ambulatory Visit: Payer: Self-pay

## 2019-01-26 ENCOUNTER — Telehealth: Payer: Self-pay | Admitting: Cardiology

## 2019-01-26 DIAGNOSIS — I340 Nonrheumatic mitral (valve) insufficiency: Secondary | ICD-10-CM | POA: Diagnosis present

## 2019-01-26 DIAGNOSIS — I420 Dilated cardiomyopathy: Secondary | ICD-10-CM

## 2019-01-26 DIAGNOSIS — I5042 Chronic combined systolic (congestive) and diastolic (congestive) heart failure: Secondary | ICD-10-CM | POA: Insufficient documentation

## 2019-01-26 DIAGNOSIS — I4821 Permanent atrial fibrillation: Secondary | ICD-10-CM | POA: Diagnosis present

## 2019-01-26 DIAGNOSIS — E876 Hypokalemia: Secondary | ICD-10-CM

## 2019-01-26 NOTE — Addendum Note (Signed)
Addended by: Annita Brod on: 01/26/2019 09:38 AM   Modules accepted: Orders

## 2019-01-26 NOTE — Telephone Encounter (Signed)
SPOKE PATIENT AND HUSBAND.  REVIEWED WITH DR HARDING DURING  CONVERSATION   PATIENT IS TO HAVE BMP  DRAWN TOMORROW ONLY  AND CONTINUE WITH THE INCREASE POTASSIUM ( 4 TABLETS TODAY)  TODAY AS PRESCRIBED  EARILER.  PATIENT HAD ECHO COMPLETED TODAY.  PATIENT AND HUSBAND AWARE TO COME TO OFFICE 01/27/19 FOR LABS  NON -FASTING

## 2019-01-26 NOTE — Telephone Encounter (Signed)
New message:     Patient calling stating that she has a ECHO and would like to know if they need labs.

## 2019-01-26 NOTE — Telephone Encounter (Signed)
Left detailed message on pt cell voicemail per DPR advising that she would not need lab work for her echo today but that Dr. Ellyn Hack did recommend the following d/t low K+:  "OK - should probably back off to 1/2 diuretic for ~2-3 days & go back to normal dosing.  Needs recheck by Friday - BMP.  Glenetta Hew, MD"  Advised to call back with any questions/concerns

## 2019-01-27 ENCOUNTER — Telehealth: Payer: Self-pay | Admitting: Cardiology

## 2019-01-27 DIAGNOSIS — Z79899 Other long term (current) drug therapy: Secondary | ICD-10-CM

## 2019-01-27 LAB — BASIC METABOLIC PANEL
BUN/Creatinine Ratio: 27 (ref 12–28)
BUN: 36 mg/dL — ABNORMAL HIGH (ref 8–27)
CO2: 26 mmol/L (ref 20–29)
Calcium: 9.6 mg/dL (ref 8.7–10.3)
Chloride: 99 mmol/L (ref 96–106)
Creatinine, Ser: 1.34 mg/dL — ABNORMAL HIGH (ref 0.57–1.00)
GFR calc Af Amer: 47 mL/min/{1.73_m2} — ABNORMAL LOW (ref >59)
GFR calc non Af Amer: 40 mL/min/{1.73_m2} — ABNORMAL LOW (ref >59)
Glucose: 141 mg/dL — ABNORMAL HIGH (ref 65–99)
Potassium: 2.8 mmol/L — CL (ref 3.5–5.2)
Sodium: 140 mmol/L (ref 134–144)

## 2019-01-27 LAB — COMPREHENSIVE METABOLIC PANEL
ALT: 30 IU/L (ref 0–32)
AST: 30 IU/L (ref 0–40)
Albumin/Globulin Ratio: 1.6 (ref 1.2–2.2)
Albumin: 4.2 g/dL (ref 3.8–4.8)
Alkaline Phosphatase: 86 IU/L (ref 39–117)
BUN/Creatinine Ratio: 25 (ref 12–28)
BUN: 44 mg/dL — ABNORMAL HIGH (ref 8–27)
Bilirubin Total: 0.7 mg/dL (ref 0.0–1.2)
CO2: 27 mmol/L (ref 20–29)
Calcium: 9.9 mg/dL (ref 8.7–10.3)
Chloride: 97 mmol/L (ref 96–106)
Creatinine, Ser: 1.75 mg/dL — ABNORMAL HIGH (ref 0.57–1.00)
GFR calc Af Amer: 34 mL/min/{1.73_m2} — ABNORMAL LOW (ref >59)
GFR calc non Af Amer: 29 mL/min/{1.73_m2} — ABNORMAL LOW (ref >59)
Globulin, Total: 2.7 g/dL (ref 1.5–4.5)
Glucose: 168 mg/dL — ABNORMAL HIGH (ref 65–99)
Potassium: 2.8 mmol/L — CL (ref 3.5–5.2)
Sodium: 142 mmol/L (ref 134–144)
Total Protein: 6.9 g/dL (ref 6.0–8.5)

## 2019-01-27 LAB — BRAIN NATRIURETIC PEPTIDE: BNP: 79.1 pg/mL (ref 0.0–100.0)

## 2019-01-27 NOTE — Telephone Encounter (Signed)
Received call from [place] with K+ 2.8 on labs today at 10:30am. In talking with patient she had increased her K+ supplement from 23meq BID to 63meq BID. Her lasix was reduced to daily. She did take 42meq this morning prior to her lab draw. Have asked that she increase this evening dose to 6meq and then resume 81meq BID. Will plan for lab recheck on 01/30/19. Patient voiced understanding and thanked me for follow up call.

## 2019-01-30 LAB — BASIC METABOLIC PANEL
BUN/Creatinine Ratio: 30 — ABNORMAL HIGH (ref 12–28)
BUN: 41 mg/dL — ABNORMAL HIGH (ref 8–27)
CO2: 24 mmol/L (ref 20–29)
Calcium: 10 mg/dL (ref 8.7–10.3)
Chloride: 101 mmol/L (ref 96–106)
Creatinine, Ser: 1.38 mg/dL — ABNORMAL HIGH (ref 0.57–1.00)
GFR calc Af Amer: 45 mL/min/{1.73_m2} — ABNORMAL LOW (ref >59)
GFR calc non Af Amer: 39 mL/min/{1.73_m2} — ABNORMAL LOW (ref >59)
Glucose: 105 mg/dL — ABNORMAL HIGH (ref 65–99)
Potassium: 3.6 mmol/L (ref 3.5–5.2)
Sodium: 142 mmol/L (ref 134–144)

## 2019-02-13 DIAGNOSIS — M1712 Unilateral primary osteoarthritis, left knee: Secondary | ICD-10-CM | POA: Insufficient documentation

## 2019-02-28 ENCOUNTER — Other Ambulatory Visit: Payer: Self-pay | Admitting: Cardiology

## 2019-03-18 ENCOUNTER — Other Ambulatory Visit: Payer: Self-pay | Admitting: Cardiology

## 2019-03-20 NOTE — Telephone Encounter (Signed)
°*  STAT* If patient is at the pharmacy, call can be transferred to refill team.   1. Which medications need to be refilled? (please list name of each medication and dose if known) isosorbide mononitrate (IMDUR) 60 MG 24 hr tablet  2. Which pharmacy/location (including street and city if local pharmacy) is medication to be sent to? [id number] [place], [place] - [address]. AT [address]  3. Do they need a 30 day or 90 day supply?  30 day  Patient is out of meds.

## 2019-03-24 ENCOUNTER — Other Ambulatory Visit: Payer: Self-pay | Admitting: Cardiology

## 2019-04-06 ENCOUNTER — Other Ambulatory Visit: Payer: Self-pay | Admitting: Cardiology

## 2019-04-06 NOTE — Telephone Encounter (Signed)
Rx(s) sent to pharmacy electronically.  

## 2019-04-06 NOTE — Telephone Encounter (Signed)
° °  Patient has no medication remaining  1. Which medications need to be refilled? (please list name of each medication and dose if known) potassium chloride SA (KLOR-CON M20) 20 MEQ tablet  2. Which pharmacy/location (including street and city if local pharmacy) [facility]  3. Do they need a 30 day or 90 day supply? [place]

## 2019-04-28 ENCOUNTER — Other Ambulatory Visit: Payer: Self-pay | Admitting: Cardiology

## 2019-05-16 ENCOUNTER — Other Ambulatory Visit: Payer: Self-pay | Admitting: Cardiology

## 2019-06-23 ENCOUNTER — Other Ambulatory Visit: Payer: Self-pay | Admitting: Cardiology

## 2019-06-23 NOTE — Telephone Encounter (Signed)
New Message   *STAT* If patient is at the pharmacy, call can be transferred to refill team.   1. Which medications need to be refilled? (please list name of each medication and dose if known) nitroGLYCERIN (NITROSTAT) 0.4 MG SL tablet  2. Which pharmacy/location (including street and city if local pharmacy) is medication to be sent to? [id number] [place], [place] [address]. AT [address]  3. Do they need a 30 day or 90 day supply? 90 day

## 2019-06-26 MED ORDER — NITROGLYCERIN 0.4 MG SL SUBL: SUBLINGUAL_TABLET | SUBLINGUAL | 4 refills | Status: DC

## 2019-07-10 ENCOUNTER — Other Ambulatory Visit: Payer: Self-pay | Admitting: Cardiology

## 2019-08-10 ENCOUNTER — Other Ambulatory Visit: Payer: Self-pay | Admitting: *Deleted

## 2019-08-10 ENCOUNTER — Other Ambulatory Visit: Payer: Self-pay | Admitting: Cardiology

## 2019-08-10 DIAGNOSIS — E1169 Type 2 diabetes mellitus with other specified complication: Secondary | ICD-10-CM

## 2019-08-10 DIAGNOSIS — E785 Hyperlipidemia, unspecified: Secondary | ICD-10-CM

## 2019-08-10 DIAGNOSIS — I420 Dilated cardiomyopathy: Secondary | ICD-10-CM

## 2019-08-10 DIAGNOSIS — I10 Essential (primary) hypertension: Secondary | ICD-10-CM

## 2019-08-10 DIAGNOSIS — I4821 Permanent atrial fibrillation: Secondary | ICD-10-CM

## 2019-08-10 DIAGNOSIS — I2510 Atherosclerotic heart disease of native coronary artery without angina pectoris: Secondary | ICD-10-CM

## 2019-09-17 ENCOUNTER — Other Ambulatory Visit: Payer: Self-pay | Admitting: Cardiology

## 2019-10-02 ENCOUNTER — Telehealth: Payer: Medicare Other | Admitting: Cardiology

## 2019-10-12 ENCOUNTER — Telehealth: Payer: Self-pay | Admitting: *Deleted

## 2019-10-12 ENCOUNTER — Encounter: Payer: Self-pay | Admitting: Cardiology

## 2019-10-12 ENCOUNTER — Telehealth (INDEPENDENT_AMBULATORY_CARE_PROVIDER_SITE_OTHER): Payer: Medicare Other | Admitting: Cardiology

## 2019-10-12 VITALS — BP 112/64 | HR 67 | Ht 64.0 in | Wt 206.6 lb

## 2019-10-12 DIAGNOSIS — I2510 Atherosclerotic heart disease of native coronary artery without angina pectoris: Secondary | ICD-10-CM | POA: Diagnosis not present

## 2019-10-12 DIAGNOSIS — I10 Essential (primary) hypertension: Secondary | ICD-10-CM | POA: Diagnosis not present

## 2019-10-12 DIAGNOSIS — Z9861 Coronary angioplasty status: Secondary | ICD-10-CM

## 2019-10-12 DIAGNOSIS — I4821 Permanent atrial fibrillation: Secondary | ICD-10-CM

## 2019-10-12 DIAGNOSIS — R079 Chest pain, unspecified: Secondary | ICD-10-CM | POA: Insufficient documentation

## 2019-10-12 DIAGNOSIS — I340 Nonrheumatic mitral (valve) insufficiency: Secondary | ICD-10-CM

## 2019-10-12 DIAGNOSIS — I5032 Chronic diastolic (congestive) heart failure: Secondary | ICD-10-CM

## 2019-10-12 DIAGNOSIS — E876 Hypokalemia: Secondary | ICD-10-CM

## 2019-10-12 LAB — HEMOGLOBIN A1C
Est. average glucose Bld gHb Est-mCnc: 148 mg/dL
Hgb A1c MFr Bld: 6.8 % — ABNORMAL HIGH (ref 4.8–5.6)

## 2019-10-12 LAB — COMPREHENSIVE METABOLIC PANEL
ALT: 33 IU/L — ABNORMAL HIGH (ref 0–32)
AST: 33 IU/L (ref 0–40)
Albumin/Globulin Ratio: 1.5 (ref 1.2–2.2)
Albumin: 4.5 g/dL (ref 3.8–4.8)
Alkaline Phosphatase: 85 IU/L (ref 48–121)
BUN/Creatinine Ratio: 31 — ABNORMAL HIGH (ref 12–28)
BUN: 44 mg/dL — ABNORMAL HIGH (ref 8–27)
Bilirubin Total: 0.8 mg/dL (ref 0.0–1.2)
CO2: 29 mmol/L (ref 20–29)
Calcium: 10.1 mg/dL (ref 8.7–10.3)
Chloride: 95 mmol/L — ABNORMAL LOW (ref 96–106)
Creatinine, Ser: 1.42 mg/dL — ABNORMAL HIGH (ref 0.57–1.00)
GFR calc Af Amer: 43 mL/min/{1.73_m2} — ABNORMAL LOW (ref >59)
GFR calc non Af Amer: 37 mL/min/{1.73_m2} — ABNORMAL LOW (ref >59)
Globulin, Total: 3 g/dL (ref 1.5–4.5)
Glucose: 126 mg/dL — ABNORMAL HIGH (ref 65–99)
Potassium: 2.6 mmol/L — CL (ref 3.5–5.2)
Sodium: 143 mmol/L (ref 134–144)
Total Protein: 7.5 g/dL (ref 6.0–8.5)

## 2019-10-12 LAB — LIPID PANEL
Chol/HDL Ratio: 3.1 ratio (ref 0.0–4.4)
Cholesterol, Total: 130 mg/dL (ref 100–199)
HDL: 42 mg/dL (ref >39)
LDL Chol Calc (NIH): 65 mg/dL (ref 0–99)
Triglycerides: 129 mg/dL (ref 0–149)
VLDL Cholesterol Cal: 23 mg/dL (ref 5–40)

## 2019-10-12 MED ORDER — ISOSORBIDE MONONITRATE ER 60 MG PO TB24: 60.0000 mg | ORAL_TABLET | Freq: Every day | ORAL | 11 refills | Status: DC

## 2019-10-12 MED ORDER — EZETIMIBE 10 MG PO TABS: 10.0000 mg | ORAL_TABLET | Freq: Every day | ORAL | 11 refills | Status: DC

## 2019-10-12 NOTE — Progress Notes (Signed)
Virtual Visit via Telephone Note   This visit type was conducted due to national recommendations for restrictions regarding the COVID-19 Pandemic (e.g. social distancing) in an effort to limit this patient's exposure and mitigate transmission in our community.  Due to her co-morbid illnesses, this patient is at least at moderate risk for complications without adequate follow up.  This format is felt to be most appropriate for this patient at this time.  The patient did not have access to video technology/had technical difficulties with video requiring transitioning to audio format only (telephone).  All issues noted in this document were discussed and addressed.  No physical exam could be performed with this format.  Please refer to the patient's chart for her  consent to telehealth for [facility].   Patient has given verbal permission to conduct this visit via virtual appointment and to bill insurance 10/16/2019 11:51 AM     Evaluation Performed:  Follow-up visit  Date:  10/16/2019   ID:  Stephanie Franco, DOB [birth year], MRN [id number]  Patient Location: Home Provider Location: Other:  Hospital Office  PCP:  Lin Landsman, MD  Cardiologist:  Stephanie Hew, MD  Electrophysiologist:  None   Chief Complaint:   Chief Complaint  Patient presents with  . Follow-up  . Chest Pain    usually at night, not with exertion & no dyspnea    History of Present Illness:    Stephanie Franco is a 69 y.o. female with PMH notable for permanent A. fib, chronic combined CHF, CAD-PCI who presents via audio/video conferencing for a telehealth visit today as an annual follow-up.  Stephanie Franco was last seen Sep 26, 2018 via telemedicine.  She was doing fairly well.  Keeping her weight stable roughly 190 192 pounds--requiring Zaroxolyn 1-2 times a week.  Mild pedal edema..  Remaining in great spirits.  No orthopnea or PND.  Getting out of the habit of diet and exercise.  No chest pain or  pressure.  Hospitalizations:  . none   Recent - Interim CV studies:   The following studies were reviewed today: . 01/26/2019-relook Echo: EF back up to 50 to 55%.  Indeterminate diastolic parameters.  Moderately dilated left atrium and right atrium.  Moderate MR.  Mild to moderate TR.  Inerval History   Stephanie Franco is being seen via telemedicine for annual follow-up.  She and her husband both acknowledge that they have not been as "faithful as they should be to their dietary modification.  Her baseline weight is gone up some just from gaining weight.  Now wgts usually in 204-206 lb range-- not eating quite as well as usually. Takes metolazone 2-3 x per week.  Also enjoys ~1/2 to 1 glass of wine every couple days.   Past 2 days - chest pains.  Taking NTG, with relief. Not exertional - one time was when lying down to sleep, but usually while watching TV. Feels like it may be indigestion (may be related to meals) also associated with indigestion feeling. Does not exercise, but has been getting out a bit now - not really associated with activity.  Not associated with dyspnea.  After NTG, does lots of belching.  Not noting DOE while shopping, no PND/orthopnea. Swelling controlled -usually in feet only - comes & goes.  May note in stomach, but not often.  Sleeps on 2 pillows - but bed has a lift.   Lots of stress still - some grieving, but overall better.   Has  not really noted Afib - rates usually fine.  Cardiovascular ROS: positive for - chest pain, edema, orthopnea and minimal orthopnea - occasionally feels Afib palpitations negative for - dyspnea on exertion, paroxysmal nocturnal dyspnea, rapid heart rate, shortness of breath or syncope, near syncope, TIA/amaurosis fugax, claudication   ROS:  Please see the history of present illness.    The patient does not have symptoms concerning for COVID-19 infection (fever, chills, cough, or new shortness of breath).  Review of Systems   Constitutional: Negative for malaise/fatigue and weight loss (noting some wgt gain.).  HENT: Negative for congestion and nosebleeds.   Respiratory: Negative for cough and shortness of breath.   Cardiovascular: Positive for chest pain (at rest, not associated with dysnpea).  Gastrointestinal: Positive for abdominal pain, constipation (related to gabapentin) and heartburn.  Genitourinary: Negative for hematuria.  Musculoskeletal: Positive for joint pain. Negative for falls.  Neurological:       Peripheral neuropathy - but doesn't like side effects of gabapentin  Psychiatric/Behavioral: Negative for depression (grieving controlled) and memory loss. The patient is not nervous/anxious and does not have insomnia.     Seeing Rheumatologist for leg discomfort -- Gabapentin helps, but makes her feel "altered" - sensorium off & glucose off.  Urinary frequency with diuretic.  Taking Neurontin for PN pain.   The patient is practicing social distancing. .  + Covid Vaccine injections x 2  Past Medical History:  Diagnosis Date  . Aortic atherosclerosis ([place])   . Arthritis   . Asthma   . Atrial fibrillation, permanent ([place])    Rate control with Bystolic. CHA2DS2Vasc = 6 (HTN, DM, CHF, Age 2, Female) -> on Pradaxa  . Breast cancer ([place]) 2008   S/P mastectomy  . CAD S/P percutaneous coronary angioplasty 2006;    PCI of circumflex with Taxus DES;; relook-cath Feb 2013: 50-60% short lesion in RCA, 40% ISR Circumflex stent.  . CKD (chronic kidney disease), stage III   . Complication of anesthesia    prolonged sedation after colonoscopy in [place]  . Diabetes mellitus, uncontrolled 07/14/2011  . Dilated cardiomyopathy ([place])    Mostly Resolved (EF up from ~25% to ~45% by Echo)  . DOE (dyspnea on exertion)   . Edema of both legs    Usually mild, chronic. Controlled with when necessary furosemide and diet  . 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 GERD (gastroesophageal reflux disease)   . Gout    . Hyperlipidemia   . Hypertension   . Hypokalemia   . Morbid obesity (HCC)    BMI 41  . OSA on CPAP    On CPAP  . Pulmonary hypertension, unspecified ([place])    PAP ~90 mmHg on Echo 12/2017 -- has OSA on CPAP & obesity - but Overall Improved Sx of dyspnea / edema  . Systolic murmur    Past Surgical History:  Procedure Laterality Date  . COLONOSCOPY    . CORONARY ANGIOPLASTY WITH STENT PLACEMENT  2006   stent to circumflex  . DILATION AND CURETTAGE OF UTERUS    . LEFT HEART CATHETERIZATION WITH CORONARY ANGIOGRAM N/A 07/13/2011   Procedure: LEFT HEART CATHETERIZATION WITH CORONARY ANGIOGRAM;  Surgeon: Leonie Man, MD;  Location: [facility] CATH LAB;  Service: Cardiovascular;  normal EF; 52m 50-60% lesion in RCA; patent stent in circumflex form 2006 with ~40% in-stent re-stenosis  . MASTECTOMY  2008   left  . NM MYOVIEW LTD  11/22/2014   Low Risk. No ischemia or infarction.  Not gated 2/2  A. fib - unable to assess EF  . TOTAL HIP ARTHROPLASTY Right 01/19/2018   Procedure: RIGHT TOTAL HIP ARTHROPLASTY ANTERIOR APPROACH;  Surgeon: Rod Can, MD;  Location: [facility] ORS;  Service: Orthopedics;  Laterality: Right;  Needs RNFA  . TRANSTHORACIC ECHOCARDIOGRAM  6/'15; 9/'16   a) In setting of Urosepsis: EF ~25 %, global HK (poor quality study);; b) LV EF 40-45%, Mild Anteroseptal HK. Mod-Severe  RA dilation with elevated PA Pressures (~~peak 63 mmHg).. Mild LA dilation  . TRANSTHORACIC ECHOCARDIOGRAM  12/2017; 04/15/2018   a) Improved LVEF to 45 to 50%.  Pulmonary HTN ~PAP ~ 60 mmHg. Severe LA dilation& mild /rv dilation.; b) Mod LVH. EF 40-45% (back to prior EF). Unable to assess DF (Afib). Severe R&L Atrial enlargement.  Moderate Post-Lateral directed MR with Mod RV dilation.  PAP ~66 mmHg.;   . TRANSTHORACIC ECHOCARDIOGRAM  01/2019   relook Echo: EF back up to 50 to 55%.  Indeterminate diastolic parameters.  Moderately dilated left atrium and right atrium.  Moderate MR.  Mild to moderate TR.      Current Meds  Medication Sig  . albuterol (PROVENTIL HFA;VENTOLIN HFA) 108 (90 Base) MCG/ACT inhaler Inhale 1-2 puffs into the lungs every 6 (six) hours as needed for wheezing or shortness of breath.  Marland Kitchen albuterol (PROVENTIL) (2.5 MG/3ML) 0.083% nebulizer solution Take 3 mLs (2.5 mg total) by nebulization every 6 (six) hours as needed for wheezing or shortness of breath.  . allopurinol (ZYLOPRIM) 300 MG tablet Take 300 mg by mouth daily.   Marland Kitchen atorvastatin (LIPITOR) 40 MG tablet TAKE 1 TABLET BY MOUTH EVERY DAY  . blood glucose meter kit and supplies KIT Dispense based on patient and insurance preference. Use up to four times daily as directed. (FOR ICD-9 250.00, 250.01).  . BYSTOLIC 10 MG tablet TAKE 1 TABLET BY MOUTH EVERY DAY  . Colchicine (MITIGARE) 0.6 MG CAPS Take by mouth.  . colchicine 0.6 MG tablet Take 0.6 mg by mouth every evening.   Marland Kitchen ELIQUIS 5 MG TABS tablet TAKE 1 TABLET BY MOUTH TWICE A DAY  . ezetimibe (ZETIA) 10 MG tablet Take 1 tablet (10 mg total) by mouth daily.  . ferrous sulfate 325 (65 FE) MG EC tablet Take 325 mg by mouth daily with breakfast.  . fexofenadine (ALLEGRA) 180 MG tablet Take 180 mg by mouth daily as needed for allergies.   . fluticasone (FLONASE) 50 MCG/ACT nasal spray Place 2 sprays into the nose daily as needed for allergies or rhinitis.   . furosemide (LASIX) 80 MG tablet TAKE 1 TABLET (80 MG TOTAL) BY MOUTH 2 (TWO) TIMES DAILY.  Marland Kitchen gabapentin (NEURONTIN) 600 MG tablet Take 600 mg by mouth daily.  Marland Kitchen glucose blood (ACCU-CHEK SMARTVIEW) test strip 1 each by Other route as needed for other. Use as instructed  . Insulin Glargine (TOUJEO SOLOSTAR) 300 UNIT/ML SOPN Inject 40 Units into the skin 2 (two) times daily before a meal.   . isosorbide mononitrate (IMDUR) 60 MG 24 hr tablet Take 1 tablet (60 mg total) by mouth daily.  Marland Kitchen KLOR-CON M20 20 MEQ tablet TAKE 1 TABLET BY MOUTH TWICE A DAY, MAY TAKE ADDITIONAL 2 TABLETS AS NEEDED/DIRECTED  . LORazepam (ATIVAN) 1  MG tablet Take 1 mg by mouth See admin instructions. Take 1 mg by mouth two times a day- morning and evening  . metolazone (ZAROXOLYN) 5 MG tablet TAKE 1 TABLET 30 MINUTES BEFORE LASIX DAILY AS NEEDED  . Multiple  Vitamins-Minerals (MULTIVITAMIN WITH MINERALS) tablet Take 1 tablet by mouth daily.  . nitroGLYCERIN (NITROSTAT) 0.4 MG SL tablet DISSOLVE 1 TABLET (0.4 MG TOTAL) UNDER THE TONGUE EVERY 5 (FIVE) MINUTES AS NEEDED FOR CHEST PAIN.  Marland Kitchen pantoprazole (PROTONIX) 40 MG tablet TAKE 1 TABLET BY MOUTH EVERY DAY  . triamcinolone ointment (KENALOG) 0.5 % Apply to legs 2 times daily for 2 weeks (Patient taking differently: Apply 1 application topically See admin instructions. Apply to legs 2 times daily for 2 weeks)  . [DISCONTINUED] ezetimibe (ZETIA) 10 MG tablet TAKE 1 TABLET BY MOUTH EVERY DAY  . [DISCONTINUED] isosorbide mononitrate (IMDUR) 60 MG 24 hr tablet TAKE 1 TABLET BY MOUTH EVERY DAY     Allergies:   Patient has no known allergies.   Social History   Tobacco Use  . Smoking status: Former Smoker    Packs/day: 0.50    Years: 6.00    Pack years: 3.00    Types: Cigarettes    Quit date: 05/18/1992    Years since quitting: 27.4  . Smokeless tobacco: Never Used  Substance Use Topics  . Alcohol use: No    Comment: 07/13/11 "have drank occasionally; not now"  . Drug use: No     Family Hx: The patient's family history includes Atrial fibrillation in her son; Coronary artery disease in her mother; Hypertension in her mother.   Labs/Other Tests and Data Reviewed:    EKG:  No ECG reviewed.  Recent Labs: 01/25/2019: BNP 79.1 10/11/2019: ALT 33; BUN 44; Creatinine, Ser 1.42; Potassium 2.6; Sodium 143   Recent Lipid Panel Lab Results  Component Value Date/Time   CHOL 130 10/11/2019 09:52 AM   TRIG 129 10/11/2019 09:52 AM   HDL 42 10/11/2019 09:52 AM   CHOLHDL 3.1 10/11/2019 09:52 AM   CHOLHDL 3.3 05/19/2017 08:15 AM   LDLCALC 65 10/11/2019 09:52 AM   LDLCALC 61 05/19/2017 08:15 AM     Wt Readings from Last 3 Encounters:  10/12/19 206 lb 9.6 oz (93.7 kg)  09/26/18 192 lb (87.1 kg)  07/07/18 199 lb 12.8 oz (90.6 kg)   - stable weights  Objective:    Vital Signs:  BP 112/64   Pulse 67   Ht 5' 4"  (1.626 m)   Wt 206 lb 9.6 oz (93.7 kg)   SpO2 96%   BMI 35.46 kg/m   VITAL SIGNS:  reviewed pleasant female in no acute distress. A&O x 3.  Normal Mood & Affect Non-labored respirations   ASSESSMENT & PLAN:    Problem List Items Addressed This Visit    Morbid obesity ([place]) (Chronic)    She did lose quite a bit of weight let us put some back on again.  BMI is now 35 where it had been 34.  Counseled her on the importance of maintaining her stable weight and avoiding dietary indiscretion.  Fluctuating weights makes it difficult to do sliding-scale diuretic.      CAD S/P percutaneous coronary angioplasty (Chronic)    Distant history of PCI back in 2006 patent in 2013.  Last evaluation was in 2017 with a Myoview was nonischemic.  Now having some chest discomfort with typical and otherwise atypical features.  Plan: Lexiscan Myoview  Continue statin/Zetia along with beta-blocker.  Not on aspirin Plavix because of Eliquis for A. fib.      Relevant Medications   ezetimibe (ZETIA) 10 MG tablet   isosorbide mononitrate (IMDUR) 60 MG 24 hr tablet   Permanent atrial fibrillation: CHA2DS2-VASc Score 6 (  Chronic)    As far as I can tell, asymptomatic with rate controlled essentially permanent A. fib.--Rate control with Bystolic, anticoagulation with Eliquis.      Relevant Medications   ezetimibe (ZETIA) 10 MG tablet   isosorbide mononitrate (IMDUR) 60 MG 24 hr tablet   Other Relevant Orders   MYOCARDIAL PERFUSION IMAGING   Basic metabolic panel   Chronic diastolic heart failure (HCC) (Chronic)    Based on recent echo, EF is back up to normal.  Class I 2 symptoms of what is more HFrEF now with EF improved.  Mitral irritation remains moderate.  She had a pretty  bad exacerbation over a year ago in the setting of her son's death.  She lost quite a bit of weight with diuresis and is now sinuous Lasix with intermittent Zaroxolyn.--Right now diuretic dosing is limited by hypokalemia.  We will hold off on Zaroxolyn until labs rechecked.  Discussed concerns about dietary indiscretion.  Plan: Continue Bystolic and Imdur.  Not on ACE inhibitor/ARB -> however with hypokalemia, low threshold to start spironolactone        Relevant Medications   ezetimibe (ZETIA) 10 MG tablet   isosorbide mononitrate (IMDUR) 60 MG 24 hr tablet   Other Relevant Orders   MYOCARDIAL PERFUSION IMAGING   Moderate mitral regurgitation (Chronic)   Relevant Medications   ezetimibe (ZETIA) 10 MG tablet   isosorbide mononitrate (IMDUR) 60 MG 24 hr tablet   Essential hypertension (Chronic)    Blood pressure still stable.  No longer on ACE inhibitor.  With current pressures, I would probably hold off on restarting since I am more standing potentially starting spironolactone to help with diuresis and avoid hypokalemia.      Relevant Medications   ezetimibe (ZETIA) 10 MG tablet   isosorbide mononitrate (IMDUR) 60 MG 24 hr tablet   Hypokalemia    Recent labs showed potassium of 2.6. -->  For the next 2 days take 2 tablets of tabs of supplement 2 times daily as well as increased foods high in potassium.  Avoid metolazone until lab recheck. Plan recheck lab on Monday Tuesday next week      Relevant Orders   Basic metabolic panel   Chest pain with moderate risk for cardiac etiology - Primary    Anjela has known CAD as well as known history of cardiomyopathy and now having some atypical sounding chest discomfort.  There are some typical and some atypical features.  My suspicion is that this is probably GERD, however with her risk factors and history we will need to exclude ischemic chest pain.  Plan: Lexiscan Myoview  Continue Imdur  Recommend OTC Pepcid for 3 to 4 days on 3 to 4  days off.      Relevant Orders   MYOCARDIAL PERFUSION IMAGING      COVID-19 Education: The signs and symptoms of COVID-19 were discussed with the patient and how to seek care for testing (follow up with PCP or arrange E-visit).   The importance of social distancing was discussed today.  Time:   Today, I have spent 42 minutes with the patient with telehealth technology discussing the above problems.  Additional 6 minutes charting.   Medication Adjustments/Labs and Tests Ordered: Current medicines are reviewed at length with the patient today.  Concerns regarding medicines are outlined above.   Patient Instructions  Medication Instructions:   Try using OTC Pepcid 20 mg BID x 3-4 days on & 3-4 days off for your chest pain (ask Pharmacist  about interaction with gabapentin)  For today & next 2 days take 2 tabs of potassium 2 x daily (also eat more bananas, tomatoes, potatoes)  Try to avoid taking metolazone until lab recheck.  *If you need a refill on your cardiac medications before your next appointment, please call your pharmacy*  Lab Work:   BMP on Mon-Tues   If you have labs (blood work) drawn today and your tests are completely normal, you will receive your results only by: Marland Kitchen MyChart Message (if you have MyChart) OR . A paper copy in the mail If you have any lab test that is abnormal or we need to change your treatment, we will call you to review the results.   Testing/Procedures: WILL BE SCHEDULE AT [place] physician has requested that you have a lexiscan myoview. Please follow instruction sheet, as given.   Follow-Up: At [facility], you and your health needs are our priority.  As part of our continuing mission to provide you with exceptional heart care, we have created designated Provider Care Teams.  These Care Teams include your primary Cardiologist (physician) and Advanced Practice Providers (APPs -   Physician Assistants and Nurse Practitioners) who all work together to provide you with the care you need, when you need it.     Your next appointment:   6 month(s) - unless stress test is abnormal  The format for your next appointment:    Either In Person or Virtual  Provider:   Glenetta Hew, MD   Other Instructions Keep staying active OK to increase water intake a little -- labs make you look a little dry.     Signed, Stephanie Hew, MD  10/16/2019 11:51 AM    [place]

## 2019-10-12 NOTE — Patient Instructions (Addendum)
Medication Instructions:   Try using OTC Pepcid 20 mg BID x 3-4 days on & 3-4 days off for your chest pain (ask Pharmacist about interaction with gabapentin)  For today & next 2 days take 2 tabs of potassium 2 x daily (also eat more bananas, tomatoes, potatoes)  Try to avoid taking metolazone until lab recheck.  *If you need a refill on your cardiac medications before your next appointment, please call your pharmacy*  Lab Work:   BMP on Mon-Tues   If you have labs (blood work) drawn today and your tests are completely normal, you will receive your results only by: Marland Kitchen MyChart Message (if you have MyChart) OR . A paper copy in the mail If you have any lab test that is abnormal or we need to change your treatment, we will call you to review the results.   Testing/Procedures: WILL BE SCHEDULE AT [place] physician has requested that you have a lexiscan myoview. Please follow instruction sheet, as given.   Follow-Up: At [facility], you and your health needs are our priority.  As part of our continuing mission to provide you with exceptional heart care, we have created designated Provider Care Teams.  These Care Teams include your primary Cardiologist (physician) and Advanced Practice Providers (APPs -  Physician Assistants and Nurse Practitioners) who all work together to provide you with the care you need, when you need it.     Your next appointment:   6 month(s) - unless stress test is abnormal  The format for your next appointment:    Either In Person or Virtual  Provider:   Glenetta Hew, MD   Other Instructions Keep staying active OK to increase water intake a little -- labs make you look a little dry.

## 2019-10-12 NOTE — Telephone Encounter (Signed)
RN spoke to patient. Instruction were given  from today's virtual visit 10/12/19 .  AVS SUMMARY has been sent by mychart . Appt schedule for 6 month   Patient verbalized understanding

## 2019-10-12 NOTE — Telephone Encounter (Signed)
  Patient Consent for Virtual Visit         Stephanie Franco has provided verbal consent on 10/12/2019 for a virtual visit (video or telephone).   CONSENT FOR VIRTUAL VISIT FOR:  Stephanie Franco  By participating in this virtual visit I agree to the following:  I hereby voluntarily request, consent and authorize [facility] and its employed or contracted physicians, physician assistants, nurse practitioners or other licensed health care professionals (the Practitioner), to provide me with telemedicine health care services (the "Services") as deemed necessary by the treating Practitioner. I acknowledge and consent to receive the Services by the Practitioner via telemedicine. I understand that the telemedicine visit will involve communicating with the Practitioner through live audiovisual communication technology and the disclosure of certain medical information by electronic transmission. I acknowledge that I have been given the opportunity to request an in-person assessment or other available alternative prior to the telemedicine visit and am voluntarily participating in the telemedicine visit.  I understand that I have the right to withhold or withdraw my consent to the use of telemedicine in the course of my care at any time, without affecting my right to future care or treatment, and that the Practitioner or I may terminate the telemedicine visit at any time. I understand that I have the right to inspect all information obtained and/or recorded in the course of the telemedicine visit and may receive copies of available information for a reasonable fee.  I understand that some of the potential risks of receiving the Services via telemedicine include:  Marland Kitchen Delay or interruption in medical evaluation due to technological equipment failure or disruption; . Information transmitted may not be sufficient (e.g. poor resolution of images) to allow for appropriate medical decision making by the  Practitioner; and/or  . In rare instances, security protocols could fail, causing a breach of personal health information.  Furthermore, I acknowledge that it is my responsibility to provide information about my medical history, conditions and care that is complete and accurate to the best of my ability. I acknowledge that Practitioner's advice, recommendations, and/or decision may be based on factors not within their control, such as incomplete or inaccurate data provided by me or distortions of diagnostic images or specimens that may result from electronic transmissions. I understand that the practice of medicine is not an exact science and that Practitioner makes no warranties or guarantees regarding treatment outcomes. I acknowledge that a copy of this consent can be made available to me via my patient portal ([facility]), or I can request a printed copy by calling the office of [facility].    I understand that my insurance will be billed for this visit.   I have read or had this consent read to me. . I understand the contents of this consent, which adequately explains the benefits and risks of the Services being provided via telemedicine.  . I have been provided ample opportunity to ask questions regarding this consent and the Services and have had my questions answered to my satisfaction. . I give my informed consent for the services to be provided through the use of telemedicine in my medical care

## 2019-10-16 ENCOUNTER — Encounter: Payer: Self-pay | Admitting: Cardiology

## 2019-10-16 NOTE — Assessment & Plan Note (Addendum)
Based on recent echo, EF is back up to normal.  Class I 2 symptoms of what is more HFrEF now with EF improved.  Mitral irritation remains moderate.  She had a pretty bad exacerbation over a year ago in the setting of her son's death.  She lost quite a bit of weight with diuresis and is now sinuous Lasix with intermittent Zaroxolyn.--Right now diuretic dosing is limited by hypokalemia.  We will hold off on Zaroxolyn until labs rechecked.  Discussed concerns about dietary indiscretion.  Plan: Continue Bystolic and Imdur.  Not on ACE inhibitor/ARB -> however with hypokalemia, low threshold to start spironolactone

## 2019-10-16 NOTE — Assessment & Plan Note (Signed)
Recent labs showed potassium of 2.6. -->  For the next 2 days take 2 tablets of tabs of supplement 2 times daily as well as increased foods high in potassium.  Avoid metolazone until lab recheck. Plan recheck lab on Monday Tuesday next week

## 2019-10-16 NOTE — Assessment & Plan Note (Signed)
She did lose quite a bit of weight let us put some back on again.  BMI is now 35 where it had been 34.  Counseled her on the importance of maintaining her stable weight and avoiding dietary indiscretion.  Fluctuating weights makes it difficult to do sliding-scale diuretic.

## 2019-10-16 NOTE — Assessment & Plan Note (Signed)
As far as I can tell, asymptomatic with rate controlled essentially permanent A. fib.--Rate control with Bystolic, anticoagulation with Eliquis.

## 2019-10-16 NOTE — Assessment & Plan Note (Signed)
Distant history of PCI back in 2006 patent in 2013.  Last evaluation was in 2017 with a Myoview was nonischemic.  Now having some chest discomfort with typical and otherwise atypical features.  Plan: Lexiscan Myoview  Continue statin/Zetia along with beta-blocker.  Not on aspirin Plavix because of Eliquis for A. fib.

## 2019-10-16 NOTE — Assessment & Plan Note (Signed)
Stephanie Franco has known CAD as well as known history of cardiomyopathy and now having some atypical sounding chest discomfort.  There are some typical and some atypical features.  My suspicion is that this is probably GERD, however with her risk factors and history we will need to exclude ischemic chest pain.  Plan: Lexiscan Myoview  Continue Imdur  Recommend OTC Pepcid for 3 to 4 days on 3 to 4 days off.

## 2019-10-16 NOTE — Assessment & Plan Note (Signed)
Blood pressure still stable.  No longer on ACE inhibitor.  With current pressures, I would probably hold off on restarting since I am more standing potentially starting spironolactone to help with diuresis and avoid hypokalemia.

## 2019-10-17 ENCOUNTER — Other Ambulatory Visit: Payer: Self-pay

## 2019-10-17 ENCOUNTER — Telehealth: Payer: Self-pay

## 2019-10-17 DIAGNOSIS — E876 Hypokalemia: Secondary | ICD-10-CM

## 2019-10-17 LAB — BASIC METABOLIC PANEL
BUN/Creatinine Ratio: 26 (ref 12–28)
BUN: 37 mg/dL — ABNORMAL HIGH (ref 8–27)
CO2: 28 mmol/L (ref 20–29)
Calcium: 9.9 mg/dL (ref 8.7–10.3)
Chloride: 100 mmol/L (ref 96–106)
Creatinine, Ser: 1.41 mg/dL — ABNORMAL HIGH (ref 0.57–1.00)
GFR calc Af Amer: 44 mL/min/{1.73_m2} — ABNORMAL LOW (ref >59)
GFR calc non Af Amer: 38 mL/min/{1.73_m2} — ABNORMAL LOW (ref >59)
Glucose: 110 mg/dL — ABNORMAL HIGH (ref 65–99)
Potassium: 2.9 mmol/L — CL (ref 3.5–5.2)
Sodium: 146 mmol/L — ABNORMAL HIGH (ref 134–144)

## 2019-10-17 NOTE — Telephone Encounter (Signed)
Received call from [place] at [employer] calling to report low potassium 2.9.Spoke to DOD Dr.Crenshaw he advised to increase Kdur to 40 meq twice a day.Patient stated she has been taking Kdur 40 meq twice a day for the past 4 days.Advised to increase to 60 meq twice a day.Repeat bmet 6/3.

## 2019-10-18 LAB — HEMOGLOBIN A1C
Est. average glucose Bld gHb Est-mCnc: 151 mg/dL
Hgb A1c MFr Bld: 6.9 % — ABNORMAL HIGH (ref 4.8–5.6)

## 2019-10-19 LAB — BASIC METABOLIC PANEL
BUN/Creatinine Ratio: 27 (ref 12–28)
BUN: 32 mg/dL — ABNORMAL HIGH (ref 8–27)
CO2: 23 mmol/L (ref 20–29)
Calcium: 9.8 mg/dL (ref 8.7–10.3)
Chloride: 106 mmol/L (ref 96–106)
Creatinine, Ser: 1.17 mg/dL — ABNORMAL HIGH (ref 0.57–1.00)
GFR calc Af Amer: 55 mL/min/{1.73_m2} — ABNORMAL LOW (ref >59)
GFR calc non Af Amer: 47 mL/min/{1.73_m2} — ABNORMAL LOW (ref >59)
Glucose: 135 mg/dL — ABNORMAL HIGH (ref 65–99)
Potassium: 4.2 mmol/L (ref 3.5–5.2)
Sodium: 145 mmol/L — ABNORMAL HIGH (ref 134–144)

## 2019-10-23 ENCOUNTER — Ambulatory Visit: Payer: Medicare Other | Admitting: Cardiology

## 2019-10-24 ENCOUNTER — Telehealth (HOSPITAL_COMMUNITY): Payer: Self-pay

## 2019-10-24 NOTE — Telephone Encounter (Signed)
Encounter complete. 

## 2019-10-26 ENCOUNTER — Other Ambulatory Visit: Payer: Self-pay

## 2019-10-26 ENCOUNTER — Ambulatory Visit (HOSPITAL_COMMUNITY)
Admission: RE | Admit: 2019-10-26 | Discharge: 2019-10-26 | Disposition: A | Discharge status: Home / Self Care | Payer: Medicare Other | Source: Ambulatory Visit | Attending: Internal Medicine | Admitting: Internal Medicine

## 2019-10-26 DIAGNOSIS — R079 Chest pain, unspecified: Secondary | ICD-10-CM | POA: Insufficient documentation

## 2019-10-26 DIAGNOSIS — I4821 Permanent atrial fibrillation: Secondary | ICD-10-CM | POA: Diagnosis present

## 2019-10-26 DIAGNOSIS — I5032 Chronic diastolic (congestive) heart failure: Secondary | ICD-10-CM | POA: Insufficient documentation

## 2019-10-26 LAB — MYOCARDIAL PERFUSION IMAGING
Peak HR: 73 {beats}/min
Rest HR: 57 {beats}/min
SDS: 0
SRS: 3
SSS: 3
TID: 1.02

## 2019-10-26 MED ORDER — REGADENOSON 0.4 MG/5ML IV SOLN
0.4000 mg | Freq: Once | INTRAVENOUS | Status: AC
Start: 2019-10-26 — End: 2019-10-26
  Administered 2019-10-26: 0.4 mg via INTRAVENOUS

## 2019-10-26 MED ORDER — TECHNETIUM TC 99M TETROFOSMIN IV KIT
10.5000 | PACK | Freq: Once | INTRAVENOUS | Status: AC | PRN
  Administered 2019-10-26: 10.5 via INTRAVENOUS
  Filled 2019-10-26: qty 11

## 2019-10-26 MED ORDER — TECHNETIUM TC 99M TETROFOSMIN IV KIT
31.0000 | PACK | Freq: Once | INTRAVENOUS | Status: AC | PRN
  Administered 2019-10-26: 31 via INTRAVENOUS
  Filled 2019-10-26: qty 31

## 2019-10-30 ENCOUNTER — Telehealth: Payer: Self-pay | Admitting: *Deleted

## 2019-10-30 NOTE — Telephone Encounter (Signed)
Follow up  Pt and spouse called. They said if there's no urgency they will just wait to see Dr. Ellyn Hack in November but if Dr. Ellyn Hack feels like it is important to see the pt earlier to call them back to discuss  Please advise

## 2019-10-30 NOTE — Telephone Encounter (Signed)
Reviewed via mychart. Called left message on voicemail. Rn called to see if patient and husband wanted to come in sooner than Nov 2021 to discuss  Test results with Dr Ellyn Hack.

## 2019-10-30 NOTE — Telephone Encounter (Signed)
-----   Message from Leonie Man, MD sent at 10/27/2019  7:11 PM EDT ----- Doristine Devoid news.  Low risk, normal study with no evidence of perfusion defect to suspect heart artery blockages causing symptoms.  Unfortunately, we did not get gated images because of irregular heartbeat.  If we are looking for pump function, probably need an echocardiogram.  Can discuss in follow-up.  Glenetta Hew c

## 2019-11-17 NOTE — Progress Notes (Signed)
Office Visit Note  Patient: Stephanie Franco             Date of Birth: 1950/12/13           MRN: [id number]             PCP: Lin Landsman, MD Referring: Lin Landsman, MD Visit Date: 11/29/2019 Occupation: @GUAROCC @  Subjective:  Pain in multiple joints.   History of Present Illness: Stephanie GLYNN is a 69 y.o. female seen in consultation per request of her PCP. According to the patient her symptoms of gout is started about 8 years ago. She states the gout was initially in her toes and then it moved to her ankles. She has been on colchicine and allopurinol for many years. She states her gout flares when she does not watch her diet and drinks alcohol. She has not had a bad flare in the last 6 months. The last blood pressure was in the right foot. She continues to have some stiffness in her multiple joints including her hands and her feet. She has been also having a lot of discomfort in her neck. She denies any radiculopathy. She states she would like to have x-rays of her cervical spine. She sees an orthopedic surgeon for arthritis. She states she has had right total hip replacement in the past. She has been told that she has arthritis in her left hip and her knee joints.  She complains of numbness in her bilateral lower extremities for the last 2 years.  She states she was given gabapentin which helps her symptoms but she has a lot of side effects from it including dizziness and drowsiness and balance issues.  She is looking for some other options for her neuropathy.  Activities of Daily Living:  Patient reports morning stiffness for several  hours.   Patient Reports nocturnal pain.  Difficulty dressing/grooming: Denies Difficulty climbing stairs: Reports Difficulty getting out of chair: Reports Difficulty using hands for taps, buttons, cutlery, and/or writing: Reports  Review of Systems  Constitutional: Negative for fatigue.  HENT: Positive for mouth dryness and nose dryness. Negative  for mouth sores.   Eyes: Negative for itching and dryness.  Respiratory: Negative for shortness of breath and difficulty breathing.   Cardiovascular: Negative for chest pain and palpitations.  Gastrointestinal: Positive for constipation. Negative for blood in stool and diarrhea.  Endocrine: Positive for increased urination.  Genitourinary: Negative for difficulty urinating.  Musculoskeletal: Positive for arthralgias, joint pain, joint swelling and morning stiffness. Negative for myalgias, muscle tenderness and myalgias.  Skin: Positive for color change. Negative for rash and redness.  Allergic/Immunologic: Negative for susceptible to infections.  Neurological: Positive for dizziness and numbness. Negative for memory loss and weakness.  Hematological: Positive for bruising/bleeding tendency.  Psychiatric/Behavioral: Negative for confusion.    PMFS History:  Patient Active Problem List   Diagnosis Date Noted  . Generalized joint pain 11/29/2019  . Chest pain with moderate risk for cardiac etiology 10/12/2019  . History of total hip arthroplasty 06/01/2018  . Itching 05/02/2018  . Acute on chronic combined systolic and diastolic CHF (congestive heart failure) ([place]) 04/16/2018  . GERD (gastroesophageal reflux disease) 04/15/2018  . Gout 04/15/2018  . Hypomagnesemia 04/15/2018  . Primary osteoarthritis of right hip 01/19/2018  . Pulmonary hypertension ([place]) 01/06/2018  . Moderate mitral regurgitation 12/10/2017  . Preoperative cardiovascular examination 12/10/2017  . Osteoarthritis of right hip 06/22/2017  . Abnormal serum creatinine level 05/31/2017  . Medication management 11/16/2016  .  Dilated cardiomyopathy ([place]) 01/29/2015  . Chronic diastolic heart failure ([place])   . Hypokalemia 10/31/2014  . Pyelonephritis 10/31/2014  . Abnormal liver function 10/31/2014  . Chronic kidney disease (CKD), stage III (moderate) (HCC)   . Severe obesity (BMI >= 40) ([place]) 06/24/2013  . Cramps of  lower extremity 06/24/2013  . CAD S/P percutaneous coronary angioplasty   . Permanent atrial fibrillation: CHA2DS2-VASc Score 6   . Edema of lower extremity   . Diabetes mellitus ([place]) 07/14/2011  . Morbid obesity ([place]) 07/12/2011  . Uterine leiomyoma 07/12/2011  . Hyperlipidemia associated with type 2 diabetes mellitus ([place]) 07/12/2011  . Obstructive sleep apnea syndrome 07/12/2011  . Essential hypertension     Past Medical History:  Diagnosis Date  . Aortic atherosclerosis ([place])   . Arthritis   . Asthma   . Atrial fibrillation, permanent ([place])    Rate control with Bystolic. CHA2DS2Vasc = 6 (HTN, DM, CHF, Age 73, Female) -> on Pradaxa  . Breast cancer ([place]) 2008   S/P mastectomy  . CAD S/P percutaneous coronary angioplasty 2006;    PCI of circumflex with Taxus DES;; relook-cath Feb 2013: 50-60% short lesion in RCA, 40% ISR Circumflex stent.  . CKD (chronic kidney disease), stage III   . Complication of anesthesia    prolonged sedation after colonoscopy in [place]  . Diabetes mellitus, uncontrolled 07/14/2011  . Dilated cardiomyopathy ([place])    Mostly Resolved (EF up from ~25% to ~45% by Echo)  . DOE (dyspnea on exertion)   . Edema of both legs    Usually mild, chronic. Controlled with when necessary furosemide and diet  . 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 GERD (gastroesophageal reflux disease)   . Gout   . Hyperlipidemia   . Hypertension   . Hypokalemia   . Morbid obesity (HCC)    BMI 41  . OSA on CPAP    On CPAP  . Pulmonary hypertension, unspecified ([place])    PAP ~90 mmHg on Echo 12/2017 -- has OSA on CPAP & obesity - but Overall Improved Sx of dyspnea / edema  . Systolic murmur     Family History  Problem Relation Age of Onset  . Coronary artery disease Mother   . Hypertension Mother   . Heart disease Father   . Atrial fibrillation Son   . Lung cancer Sister   . Heart disease Brother   . Heart disease Brother   . Healthy Son   . Thyroid disease  Daughter   . Healthy Daughter   . Healthy Daughter    Past Surgical History:  Procedure Laterality Date  . COLONOSCOPY    . CORONARY ANGIOPLASTY WITH STENT PLACEMENT  2006   stent to circumflex  . DILATION AND CURETTAGE OF UTERUS    . LEFT HEART CATHETERIZATION WITH CORONARY ANGIOGRAM N/A 07/13/2011   Procedure: LEFT HEART CATHETERIZATION WITH CORONARY ANGIOGRAM;  Surgeon: Leonie Man, MD;  Location: [facility] C[facility] LAB;  Service: Cardiovascular;  normal EF; 82mm 50-60% lesion in RCA; patent stent in circumflex form 2006 with ~40% in-stent re-stenosis  . MASTECTOMY  2008   left  . NM MYOVIEW LTD  11/22/2014   Low Risk. No ischemia or infarction. Not gated 2/2  A. fib - unable to assess EF  . TOTAL HIP ARTHROPLASTY Right 01/19/2018   Procedure: RIGHT TOTAL HIP ARTHROPLASTY ANTERIOR APPROACH;  Surgeon: Rod Can, MD;  Location: [facility] ORS;  Service: Orthopedics;  Laterality: Right;  Needs RNFA  .  TRANSTHORACIC ECHOCARDIOGRAM  6/'15; 9/'16   a) In setting of Urosepsis: EF ~25 %, global HK (poor quality study);; b) LV EF 40-45%, Mild Anteroseptal HK. Mod-Severe  RA dilation with elevated PA Pressures (~~peak 63 mmHg).. Mild LA dilation  . TRANSTHORACIC ECHOCARDIOGRAM  12/2017; 04/15/2018   a) Improved LVEF to 45 to 50%.  Pulmonary HTN ~PAP ~ 60 mmHg. Severe LA dilation& mild /rv dilation.; b) Mod LVH. EF 40-45% (back to prior EF). Unable to assess DF (Afib). Severe R&L Atrial enlargement.  Moderate Post-Lateral directed MR with Mod RV dilation.  PAP ~66 mmHg.;   . TRANSTHORACIC ECHOCARDIOGRAM  01/2019   relook Echo: EF back up to 50 to 55%.  Indeterminate diastolic parameters.  Moderately dilated left atrium and right atrium.  Moderate MR.  Mild to moderate TR.   Social History   Social History Narrative   She is a married mother of 65, grandmother 2. Usually accompanied by her husband. She does not work. She had been working on her exercise, but is no longer as active. Does not drink and does not  smoke   Immunization History  Administered Date(s) Administered  . Influenza, High Dose Seasonal PF 03/26/2016, 03/19/2017  . Influenza,inj,quad, With Preservative 03/18/2017  . Pneumococcal Polysaccharide-23 07/13/2011     Objective: Vital Signs: BP 130/77 (BP Location: Right Wrist, Patient Position: Sitting, Cuff Size: Normal)   Pulse 62   Resp 17   Ht 5\' 3"  (1.6 m)   Wt 218 lb 3.2 oz (99 kg)   BMI 38.65 kg/m    Physical Exam Vitals and nursing note reviewed.  Constitutional:      Appearance: She is well-developed.  HENT:     Head: Normocephalic and atraumatic.  Eyes:     Conjunctiva/sclera: Conjunctivae normal.  Cardiovascular:     Rate and Rhythm: Normal rate and regular rhythm.     Heart sounds: Normal heart sounds.  Pulmonary:     Effort: Pulmonary effort is normal.     Breath sounds: Normal breath sounds.  Abdominal:     General: Bowel sounds are normal.     Palpations: Abdomen is soft.  Musculoskeletal:     Cervical back: Normal range of motion.  Lymphadenopathy:     Cervical: No cervical adenopathy.  Skin:    General: Skin is warm and dry.     Capillary Refill: Capillary refill takes less than 2 seconds.  Neurological:     Mental Status: She is alert and oriented to person, place, and time.  Psychiatric:        Behavior: Behavior normal.      Musculoskeletal Exam: She had painful range of motion of her cervical spine.  Shoulder joints elbow joints wrist joints MCPs PIPs DIPs with good range of motion with no synovitis.  Right hip joint is replaced.  She had limited range of motion of her left hip joint.  She has crepitus with range of motion of any joints without any warmth swelling or effusion.  She has some osteoarthritic changes in her feet with no synovitis.  CDAI Exam: CDAI Score: -- Patient Global: --; Provider Global: -- Swollen: --; Tender: -- Joint Exam 11/29/2019   No joint exam has been documented for this visit   There is currently no  information documented on the homunculus. Go to the Rheumatology activity and complete the homunculus joint exam.  Investigation: No additional findings.  Imaging: No results found.  Recent Labs: Lab Results  Component Value Date   WBC  8.2 05/04/2018   HGB 8.9 (L) 05/04/2018   PLT 254 05/04/2018   NA 145 (H) 10/19/2019   K 4.2 10/19/2019   CL 106 10/19/2019   CO2 23 10/19/2019   GLUCOSE 135 (H) 10/19/2019   BUN 32 (H) 10/19/2019   CREATININE 1.17 (H) 10/19/2019   BILITOT 0.8 10/11/2019   ALKPHOS 85 10/11/2019   AST 33 10/11/2019   ALT 33 (H) 10/11/2019   PROT 7.5 10/11/2019   ALBUMIN 4.5 10/11/2019   CALCIUM 9.8 10/19/2019   GFRAA 55 (L) 10/19/2019    Speciality Comments: No specialty comments available.  Procedures:  No procedures performed Allergies: Patient has no known allergies.   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  She states she has been on allopurinol and colchicine on a regular basis.  She has intermittent flares.  She believes that flares are related to her diet and alcohol use.  Detailed counseling gouty arthropathy was provided.  Abstinence from red meat and alcohol was discussed.  We will check uric acid level today.  Pain in both hands -she complains of discomfort in her bilateral hands.  She has mild osteoarthritic changes in her CMC joint.  Plan: XR Hand 2 View Right, XR Hand 2 View Left.  X-ray of bilateral hands were consistent with osteoarthritis.  Primary osteoarthritis of left hip-patient is followed by her orthopedic surgeon.  Status post total hip replacement, right-doing well.  Pain in both feet -she complains of discomfort in her bilateral feet.  No synovitis was noted.  Plan: XR Foot 2 Views Right, XR Foot 2 Views Left,.  X-rays were consistent with osteoarthritis of bilateral feet.  Uric acid, Rheumatoid factor, Cyclic citrul peptide antibody, IgG  Neck pain -he complains of  discomfort in her cervical spine.  Plan: XR Cervical Spine 2 or 3 views.  X-ray shows multilevel spondylosis and facet joint arthropathy.  Other medical problems are listed as follows:  Stage 3 chronic kidney disease, unspecified whether stage 3a or 3b CKD  Acute on chronic combined systolic and diastolic CHF (congestive heart failure) (HCC)  CAD S/P percutaneous coronary angioplasty  Dilated cardiomyopathy ([place])  Essential hypertension  Moderate mitral regurgitation  Permanent atrial fibrillation: CHA2DS2-VASc Score 6  Pulmonary hypertension ([place])  Obstructive sleep apnea syndrome  Hyperlipidemia associated with type 2 diabetes mellitus ([place])  History of gastroesophageal reflux (GERD)  Neuropathy-she is bothered by neuropathy in her lower extremities.  She states gabapentin is not helping and causing a lot of side effects.  I advised her to request a referral to a neurologist.  Orders: Orders Placed This Encounter  Procedures  . XR Cervical Spine 2 or 3 views  . XR Hand 2 View Right  . XR Hand 2 View Left  . XR Foot 2 Views Right  . XR Foot 2 Views Left  . Uric acid  . Rheumatoid factor  . Cyclic citrul peptide antibody, IgG   No orders of the defined types were placed in this encounter.   .  Follow-Up Instructions: Return for Gout, Osteoarthritis.   Bo Merino, MD  Note - This record has been created using Editor, commissioning.  Chart creation errors have been sought, but may not always  have been located. Such creation errors do not reflect on  the standard of medical care.

## 2019-11-27 ENCOUNTER — Ambulatory Visit: Payer: Medicare Other | Admitting: Rheumatology

## 2019-11-29 ENCOUNTER — Ambulatory Visit (INDEPENDENT_AMBULATORY_CARE_PROVIDER_SITE_OTHER): Payer: Medicare Other | Admitting: Rheumatology

## 2019-11-29 ENCOUNTER — Ambulatory Visit: Payer: Self-pay

## 2019-11-29 ENCOUNTER — Other Ambulatory Visit: Payer: Self-pay

## 2019-11-29 ENCOUNTER — Encounter: Payer: Self-pay | Admitting: Rheumatology

## 2019-11-29 VITALS — BP 130/77 | HR 62 | Resp 17 | Ht 63.0 in | Wt 218.2 lb

## 2019-11-29 DIAGNOSIS — M79672 Pain in left foot: Secondary | ICD-10-CM | POA: Diagnosis not present

## 2019-11-29 DIAGNOSIS — Z9861 Coronary angioplasty status: Secondary | ICD-10-CM

## 2019-11-29 DIAGNOSIS — M2550 Pain in unspecified joint: Secondary | ICD-10-CM | POA: Insufficient documentation

## 2019-11-29 DIAGNOSIS — I420 Dilated cardiomyopathy: Secondary | ICD-10-CM

## 2019-11-29 DIAGNOSIS — M542 Cervicalgia: Secondary | ICD-10-CM

## 2019-11-29 DIAGNOSIS — M79671 Pain in right foot: Secondary | ICD-10-CM | POA: Diagnosis not present

## 2019-11-29 DIAGNOSIS — E1169 Type 2 diabetes mellitus with other specified complication: Secondary | ICD-10-CM

## 2019-11-29 DIAGNOSIS — I4821 Permanent atrial fibrillation: Secondary | ICD-10-CM

## 2019-11-29 DIAGNOSIS — M79641 Pain in right hand: Secondary | ICD-10-CM

## 2019-11-29 DIAGNOSIS — M109 Gout, unspecified: Secondary | ICD-10-CM | POA: Diagnosis not present

## 2019-11-29 DIAGNOSIS — I2510 Atherosclerotic heart disease of native coronary artery without angina pectoris: Secondary | ICD-10-CM

## 2019-11-29 DIAGNOSIS — Z8719 Personal history of other diseases of the digestive system: Secondary | ICD-10-CM

## 2019-11-29 DIAGNOSIS — I340 Nonrheumatic mitral (valve) insufficiency: Secondary | ICD-10-CM

## 2019-11-29 DIAGNOSIS — N183 Chronic kidney disease, stage 3 unspecified: Secondary | ICD-10-CM

## 2019-11-29 DIAGNOSIS — M79642 Pain in left hand: Secondary | ICD-10-CM | POA: Diagnosis not present

## 2019-11-29 DIAGNOSIS — G4733 Obstructive sleep apnea (adult) (pediatric): Secondary | ICD-10-CM

## 2019-11-29 DIAGNOSIS — E785 Hyperlipidemia, unspecified: Secondary | ICD-10-CM

## 2019-11-29 DIAGNOSIS — I272 Pulmonary hypertension, unspecified: Secondary | ICD-10-CM

## 2019-11-29 DIAGNOSIS — Z96641 Presence of right artificial hip joint: Secondary | ICD-10-CM

## 2019-11-29 DIAGNOSIS — I5043 Acute on chronic combined systolic (congestive) and diastolic (congestive) heart failure: Secondary | ICD-10-CM

## 2019-11-29 DIAGNOSIS — G629 Polyneuropathy, unspecified: Secondary | ICD-10-CM

## 2019-11-29 DIAGNOSIS — M1612 Unilateral primary osteoarthritis, left hip: Secondary | ICD-10-CM | POA: Diagnosis not present

## 2019-11-29 DIAGNOSIS — I10 Essential (primary) hypertension: Secondary | ICD-10-CM

## 2019-11-30 LAB — CYCLIC CITRUL PEPTIDE ANTIBODY, IGG: Cyclic Citrullin Peptide Ab: 16 UNITS

## 2019-11-30 LAB — RHEUMATOID FACTOR: Rheumatoid fact SerPl-aCnc: <14 IU/mL (ref <14)

## 2019-11-30 LAB — URIC ACID: Uric Acid, Serum: 5.1 mg/dL (ref 2.5–7.0)

## 2019-11-30 NOTE — Progress Notes (Signed)
Labs are within normal limits.  I will discuss results of the follow-up visit.

## 2019-12-04 NOTE — Telephone Encounter (Signed)
Can wait until nov appointment

## 2019-12-06 ENCOUNTER — Telehealth: Payer: Self-pay | Admitting: Rheumatology

## 2019-12-06 NOTE — Telephone Encounter (Signed)
Patient called requesting her notes from her appointment with Dr. Estanislado Pandy on 11/29/19 be sent to her PCP Dr. Lin Landsman.  Patient states she has an appointment with Dr. Ayesha Rumpf this afternoon 12/06/19.

## 2019-12-06 NOTE — Telephone Encounter (Signed)
Office note faxed.

## 2019-12-12 ENCOUNTER — Other Ambulatory Visit: Payer: Self-pay | Admitting: Cardiology

## 2019-12-19 NOTE — Progress Notes (Deleted)
Office Visit Note  Patient: Stephanie Franco             Date of Birth: September 11, 1950           MRN: [id number]             PCP: Lin Landsman, MD Referring: Lin Landsman, MD Visit Date: 01/02/2020 Occupation: @GUAROCC @  Subjective:  No chief complaint on file.   History of Present Illness: Stephanie Franco is a 69 y.o. female ***   Activities of Daily Living:  Patient reports morning stiffness for *** {minute/hour:19697}.   Patient {ACTIONS;DENIES/REPORTS:21021675::"Denies"} nocturnal pain.  Difficulty dressing/grooming: {ACTIONS;DENIES/REPORTS:21021675::"Denies"} Difficulty climbing stairs: {ACTIONS;DENIES/REPORTS:21021675::"Denies"} Difficulty getting out of chair: {ACTIONS;DENIES/REPORTS:21021675::"Denies"} Difficulty using hands for taps, buttons, cutlery, and/or writing: {ACTIONS;DENIES/REPORTS:21021675::"Denies"}  No Rheumatology ROS completed.   PMFS History:  Patient Active Problem List   Diagnosis Date Noted   Generalized joint pain 11/29/2019   Chest pain with moderate risk for cardiac etiology 10/12/2019   History of total hip arthroplasty 06/01/2018   Itching 05/02/2018   Acute on chronic combined systolic and diastolic CHF (congestive heart failure) ([place]) 04/16/2018   GERD (gastroesophageal reflux disease) 04/15/2018   Gout 04/15/2018   Hypomagnesemia 04/15/2018   Primary osteoarthritis of right hip 01/19/2018   Pulmonary hypertension ([place]) 01/06/2018   Moderate mitral regurgitation 12/10/2017   Preoperative cardiovascular examination 12/10/2017   Osteoarthritis of right hip 06/22/2017   Abnormal serum creatinine level 05/31/2017   Medication management 11/16/2016   Dilated cardiomyopathy ([place]) 01/29/2015   Chronic diastolic heart failure (HCC)    Hypokalemia 10/31/2014   Pyelonephritis 10/31/2014   Abnormal liver function 10/31/2014   Chronic kidney disease (CKD), stage III (moderate) (HCC)    Severe obesity (BMI >= 40) ([place])  06/24/2013   Cramps of lower extremity 06/24/2013   CAD S/P percutaneous coronary angioplasty    Permanent atrial fibrillation: CHA2DS2-VASc Score 6    Edema of lower extremity    Diabetes mellitus ([place]) 07/14/2011   Morbid obesity ([place]) 07/12/2011   Uterine leiomyoma 07/12/2011   Hyperlipidemia associated with type 2 diabetes mellitus ([place]) 07/12/2011   Obstructive sleep apnea syndrome 07/12/2011   Essential hypertension     Past Medical History:  Diagnosis Date   Aortic atherosclerosis (HCC)    Arthritis    Asthma    Atrial fibrillation, permanent ([place])    Rate control with Bystolic. CHA2DS2Vasc = 6 (HTN, DM, CHF, Age 48, Female) -> on Pradaxa   Breast cancer ([place]) 2008   S/P mastectomy   CAD S/P percutaneous coronary angioplasty 2006;    PCI of circumflex with Taxus DES;; relook-cath Feb 2013: 50-60% short lesion in RCA, 40% ISR Circumflex stent.   CKD (chronic kidney disease), stage III    Complication of anesthesia    prolonged sedation after colonoscopy in [place]   Diabetes mellitus, uncontrolled 07/14/2011   Dilated cardiomyopathy (HCC)    Mostly Resolved (EF up from ~25% to ~45% by Echo)   DOE (dyspnea on exertion)    Edema of both legs    Usually mild, chronic. Controlled with when necessary furosemide and diet   Fibroid, uterine 07/13/11   "have that now"   GERD (gastroesophageal reflux disease)    Gout    Hyperlipidemia    Hypertension    Hypokalemia    Morbid obesity ([place])    BMI 41   OSA on CPAP    On CPAP   Pulmonary hypertension, unspecified ([place])    PAP ~90 mmHg on Echo 12/2017 --  has OSA on CPAP & obesity - but Overall Improved Sx of dyspnea / edema   Systolic murmur     Family History  Problem Relation Age of Onset   Coronary artery disease Mother    Hypertension Mother    Heart disease Father    Atrial fibrillation Son    Lung cancer Sister    Heart disease Brother    Heart disease Brother    Healthy Son      Thyroid disease Daughter    Healthy Daughter    Healthy Daughter    Past Surgical History:  Procedure Laterality Date   COLONOSCOPY     CORONARY ANGIOPLASTY WITH STENT PLACEMENT  2006   stent to circumflex   DILATION AND CURETTAGE OF UTERUS     LEFT HEART CATHETERIZATION WITH CORONARY ANGIOGRAM N/A 07/13/2011   Procedure: LEFT HEART CATHETERIZATION WITH CORONARY ANGIOGRAM;  Surgeon: Leonie Man, MD;  Location: [facility] CATH LAB;  Service: Cardiovascular;  normal EF; 27mm 50-60% lesion in RCA; patent stent in circumflex form 2006 with ~40% in-stent re-stenosis   MASTECTOMY  2008   left   NM MYOVIEW LTD  11/22/2014   Low Risk. No ischemia or infarction. Not gated 2/2  A. fib - unable to assess EF   TOTAL HIP ARTHROPLASTY Right 01/19/2018   Procedure: RIGHT TOTAL HIP ARTHROPLASTY ANTERIOR APPROACH;  Surgeon: Rod Can, MD;  Location: [facility] ORS;  Service: Orthopedics;  Laterality: Right;  Needs RNFA   TRANSTHORACIC ECHOCARDIOGRAM  6/'15; 9/'16   a) In setting of Urosepsis: EF ~25 %, global HK (poor quality study);; b) LV EF 40-45%, Mild Anteroseptal HK. Mod-Severe  RA dilation with elevated PA Pressures (~~peak 63 mmHg).. Mild LA dilation   TRANSTHORACIC ECHOCARDIOGRAM  12/2017; 04/15/2018   a) Improved LVEF to 45 to 50%.  Pulmonary HTN ~PAP ~ 60 mmHg. Severe LA dilation& mild /rv dilation.; b) Mod LVH. EF 40-45% (back to prior EF). Unable to assess DF (Afib). Severe R&L Atrial enlargement.  Moderate Post-Lateral directed MR with Mod RV dilation.  PAP ~66 mmHg.;    TRANSTHORACIC ECHOCARDIOGRAM  01/2019   relook Echo: EF back up to 50 to 55%.  Indeterminate diastolic parameters.  Moderately dilated left atrium and right atrium.  Moderate MR.  Mild to moderate TR.   Social History   Social History Narrative   She is a married mother of 28, grandmother 2. Usually accompanied by her husband. She does not work. She had been working on her exercise, but is no longer as active. Does not  drink and does not smoke   Immunization History  Administered Date(s) Administered   Influenza, High Dose Seasonal PF 03/26/2016, 03/19/2017   Influenza,inj,quad, With Preservative 03/18/2017   Pneumococcal Polysaccharide-23 07/13/2011     Objective: Vital Signs: There were no vitals taken for this visit.   Physical Exam   Musculoskeletal Exam: ***  CDAI Exam: CDAI Score: -- Patient Global: --; Provider Global: -- Swollen: --; Tender: -- Joint Exam 01/02/2020   No joint exam has been documented for this visit   There is currently no information documented on the homunculus. Go to the Rheumatology activity and complete the homunculus joint exam.  Investigation: No additional findings.  Imaging: XR Cervical Spine 2 or 3 views  Result Date: 11/29/2019 Multilevel spondylosis was noted.  Most significant narrowing was noted between C5-C6 and C6-C7.  Facet joint arthropathy was noted. Impression: These findings are consistent with multilevel spondylosis and facet joint arthropathy.  XR Foot  2 Views Left  Result Date: 11/29/2019 Severe narrowing of first MTP joint was noted.  PIP and DIP narrowing was noted.  No intertarsal or tibiotalar joint space narrowing was noted.  Subtalar joint space narrowing was noted.  Inferior and posterior calcaneal spurs were noted. Impression: These findings are consistent with osteoarthritis of the foot.  XR Foot 2 Views Right  Result Date: 11/29/2019 Severe narrowing of first MTP joint was noted.  PIP and DIP narrowing was noted.  No intertarsal or tibiotalar joint space narrowing was noted.  Inferior and posterior calcaneal spurs were noted. Impression: These findings are consistent with osteoarthritis of the foot.  XR Hand 2 View Left  Result Date: 11/29/2019 PIP, DIP, CMC joint space narrowing was noted.  No MCP, intercarpal or radiocarpal joint space narrowing was noted.  No erosive changes were noted. Impression: These findings are  consistent with osteoarthritis of the hand.  XR Hand 2 View Right  Result Date: 11/29/2019 PIP, DIP, CMC joint space narrowing was noted.  No MCP, intercarpal or radiocarpal joint space narrowing was noted.  No erosive changes were noted. Impression: These findings are consistent with osteoarthritis of the hand.   Recent Labs: Lab Results  Component Value Date   WBC 8.2 05/04/2018   HGB 8.9 (L) 05/04/2018   PLT 254 05/04/2018   NA 145 (H) 10/19/2019   K 4.2 10/19/2019   CL 106 10/19/2019   CO2 23 10/19/2019   GLUCOSE 135 (H) 10/19/2019   BUN 32 (H) 10/19/2019   CREATININE 1.17 (H) 10/19/2019   BILITOT 0.8 10/11/2019   ALKPHOS 85 10/11/2019   AST 33 10/11/2019   ALT 33 (H) 10/11/2019   PROT 7.5 10/11/2019   ALBUMIN 4.5 10/11/2019   CALCIUM 9.8 10/19/2019   GFRAA 55 (L) 10/19/2019  November 29, 2019 uric acid 5.1, RF negative, anti-CCP negative  Speciality Comments: No specialty comments available.  Procedures:  No procedures performed Allergies: Patient has no known allergies.   Assessment / Plan:     Visit Diagnoses: Gouty arthritis  DDD (degenerative disc disease), cervical  Primary osteoarthritis of both hands  Primary osteoarthritis of both feet  Primary osteoarthritis of left hip  Status post total hip replacement, right  Stage 3 chronic kidney disease, unspecified whether stage 3a or 3b CKD  Acute on chronic combined systolic and diastolic CHF (congestive heart failure) (HCC)  CAD S/P percutaneous coronary angioplasty  Dilated cardiomyopathy (HCC)  Essential hypertension  Moderate mitral regurgitation  Permanent atrial fibrillation: CHA2DS2-VASc Score 6  Pulmonary hypertension ([place])  Obstructive sleep apnea syndrome  Hyperlipidemia associated with type 2 diabetes mellitus (HCC)  History of gastroesophageal reflux (GERD)  Neuropathy  Orders: No orders of the defined types were placed in this encounter.  No orders of the defined types were  placed in this encounter.   Face-to-face time spent with patient was *** minutes. Greater than 50% of time was spent in counseling and coordination of care.  Follow-Up Instructions: No follow-ups on file.   Bo Merino, MD  Note - This record has been created using Editor, commissioning.  Chart creation errors have been sought, but may not always  have been located. Such creation errors do not reflect on  the standard of medical care.

## 2019-12-27 ENCOUNTER — Other Ambulatory Visit: Payer: Self-pay | Admitting: Cardiology

## 2020-01-02 ENCOUNTER — Ambulatory Visit: Payer: Medicare Other | Admitting: Rheumatology

## 2020-01-02 DIAGNOSIS — Z8719 Personal history of other diseases of the digestive system: Secondary | ICD-10-CM

## 2020-01-02 DIAGNOSIS — I420 Dilated cardiomyopathy: Secondary | ICD-10-CM

## 2020-01-02 DIAGNOSIS — I10 Essential (primary) hypertension: Secondary | ICD-10-CM

## 2020-01-02 DIAGNOSIS — M19041 Primary osteoarthritis, right hand: Secondary | ICD-10-CM

## 2020-01-02 DIAGNOSIS — Z96641 Presence of right artificial hip joint: Secondary | ICD-10-CM

## 2020-01-02 DIAGNOSIS — I5043 Acute on chronic combined systolic (congestive) and diastolic (congestive) heart failure: Secondary | ICD-10-CM

## 2020-01-02 DIAGNOSIS — I340 Nonrheumatic mitral (valve) insufficiency: Secondary | ICD-10-CM

## 2020-01-02 DIAGNOSIS — E1169 Type 2 diabetes mellitus with other specified complication: Secondary | ICD-10-CM

## 2020-01-02 DIAGNOSIS — I4821 Permanent atrial fibrillation: Secondary | ICD-10-CM

## 2020-01-02 DIAGNOSIS — M1612 Unilateral primary osteoarthritis, left hip: Secondary | ICD-10-CM

## 2020-01-02 DIAGNOSIS — M109 Gout, unspecified: Secondary | ICD-10-CM

## 2020-01-02 DIAGNOSIS — M5030 Other cervical disc degeneration, unspecified cervical region: Secondary | ICD-10-CM

## 2020-01-02 DIAGNOSIS — G629 Polyneuropathy, unspecified: Secondary | ICD-10-CM

## 2020-01-02 DIAGNOSIS — M19071 Primary osteoarthritis, right ankle and foot: Secondary | ICD-10-CM

## 2020-01-02 DIAGNOSIS — I272 Pulmonary hypertension, unspecified: Secondary | ICD-10-CM

## 2020-01-02 DIAGNOSIS — G4733 Obstructive sleep apnea (adult) (pediatric): Secondary | ICD-10-CM

## 2020-01-02 DIAGNOSIS — I2510 Atherosclerotic heart disease of native coronary artery without angina pectoris: Secondary | ICD-10-CM

## 2020-02-06 ENCOUNTER — Other Ambulatory Visit: Payer: Self-pay | Admitting: Cardiology

## 2020-02-09 NOTE — Progress Notes (Deleted)
Office Visit Note  Patient: Stephanie Franco             Date of Birth: Jun 13, 1950           MRN: [id number]             PCP: Lin Landsman, MD Referring: Lin Landsman, MD Visit Date: 02/23/2020 Occupation: @GUAROCC @  Subjective:  No chief complaint on file.   History of Present Illness: Stephanie Franco is a 69 y.o. female ***   Activities of Daily Living:  Patient reports morning stiffness for *** {minute/hour:19697}.   Patient {ACTIONS;DENIES/REPORTS:21021675::"Denies"} nocturnal pain.  Difficulty dressing/grooming: {ACTIONS;DENIES/REPORTS:21021675::"Denies"} Difficulty climbing stairs: {ACTIONS;DENIES/REPORTS:21021675::"Denies"} Difficulty getting out of chair: {ACTIONS;DENIES/REPORTS:21021675::"Denies"} Difficulty using hands for taps, buttons, cutlery, and/or writing: {ACTIONS;DENIES/REPORTS:21021675::"Denies"}  No Rheumatology ROS completed.   PMFS History:  Patient Active Problem List   Diagnosis Date Noted  . Generalized joint pain 11/29/2019  . Chest pain with moderate risk for cardiac etiology 10/12/2019  . History of total hip arthroplasty 06/01/2018  . Itching 05/02/2018  . Acute on chronic combined systolic and diastolic CHF (congestive heart failure) ([place]) 04/16/2018  . GERD (gastroesophageal reflux disease) 04/15/2018  . Gout 04/15/2018  . Hypomagnesemia 04/15/2018  . Primary osteoarthritis of right hip 01/19/2018  . Pulmonary hypertension ([place]) 01/06/2018  . Moderate mitral regurgitation 12/10/2017  . Preoperative cardiovascular examination 12/10/2017  . Osteoarthritis of right hip 06/22/2017  . Abnormal serum creatinine level 05/31/2017  . Medication management 11/16/2016  . Dilated cardiomyopathy ([place]) 01/29/2015  . Chronic diastolic heart failure ([place])   . Hypokalemia 10/31/2014  . Pyelonephritis 10/31/2014  . Abnormal liver function 10/31/2014  . Chronic kidney disease (CKD), stage III (moderate) (HCC)   . Severe obesity (BMI >= 40) ([place])  06/24/2013  . Cramps of lower extremity 06/24/2013  . CAD S/P percutaneous coronary angioplasty   . Permanent atrial fibrillation: CHA2DS2-VASc Score 6   . Edema of lower extremity   . Diabetes mellitus ([place]) 07/14/2011  . Morbid obesity ([place]) 07/12/2011  . Uterine leiomyoma 07/12/2011  . Hyperlipidemia associated with type 2 diabetes mellitus ([place]) 07/12/2011  . Obstructive sleep apnea syndrome 07/12/2011  . Essential hypertension     Past Medical History:  Diagnosis Date  . Aortic atherosclerosis ([place])   . Arthritis   . Asthma   . Atrial fibrillation, permanent ([place])    Rate control with Bystolic. CHA2DS2Vasc = 6 (HTN, DM, CHF, Age 5, Female) -> on Pradaxa  . Breast cancer ([place]) 2008   S/P mastectomy  . CAD S/P percutaneous coronary angioplasty 2006;    PCI of circumflex with Taxus DES;; relook-cath Feb 2013: 50-60% short lesion in RCA, 40% ISR Circumflex stent.  . CKD (chronic kidney disease), stage III   . Complication of anesthesia    prolonged sedation after colonoscopy in [place]  . Diabetes mellitus, uncontrolled 07/14/2011  . Dilated cardiomyopathy ([place])    Mostly Resolved (EF up from ~25% to ~45% by Echo)  . DOE (dyspnea on exertion)   . Edema of both legs    Usually mild, chronic. Controlled with when necessary furosemide and diet  . 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 GERD (gastroesophageal reflux disease)   . Gout   . Hyperlipidemia   . Hypertension   . Hypokalemia   . Morbid obesity (HCC)    BMI 41  . OSA on CPAP    On CPAP  . Pulmonary hypertension, unspecified ([place])    PAP ~90 mmHg on Echo 12/2017 --  has OSA on CPAP & obesity - but Overall Improved Sx of dyspnea / edema  . Systolic murmur     Family History  Problem Relation Age of Onset  . Coronary artery disease Mother   . Hypertension Mother   . Heart disease Father   . Atrial fibrillation Son   . Lung cancer Sister   . Heart disease Brother   . Heart disease Brother   . Healthy Son     . Thyroid disease Daughter   . Healthy Daughter   . Healthy Daughter    Past Surgical History:  Procedure Laterality Date  . COLONOSCOPY    . CORONARY ANGIOPLASTY WITH STENT PLACEMENT  2006   stent to circumflex  . DILATION AND CURETTAGE OF UTERUS    . LEFT HEART CATHETERIZATION WITH CORONARY ANGIOGRAM N/A 07/13/2011   Procedure: LEFT HEART CATHETERIZATION WITH CORONARY ANGIOGRAM;  Surgeon: Leonie Man, MD;  Location: [facility] CATH LAB;  Service: Cardiovascular;  normal EF; 27mm 50-60% lesion in RCA; patent stent in circumflex form 2006 with ~40% in-stent re-stenosis  . MASTECTOMY  2008   left  . NM MYOVIEW LTD  11/22/2014   Low Risk. No ischemia or infarction. Not gated 2/2  A. fib - unable to assess EF  . TOTAL HIP ARTHROPLASTY Right 01/19/2018   Procedure: RIGHT TOTAL HIP ARTHROPLASTY ANTERIOR APPROACH;  Surgeon: Rod Can, MD;  Location: [facility] ORS;  Service: Orthopedics;  Laterality: Right;  Needs RNFA  . TRANSTHORACIC ECHOCARDIOGRAM  6/'15; 9/'16   a) In setting of Urosepsis: EF ~25 %, global HK (poor quality study);; b) LV EF 40-45%, Mild Anteroseptal HK. Mod-Severe  RA dilation with elevated PA Pressures (~~peak 63 mmHg).. Mild LA dilation  . TRANSTHORACIC ECHOCARDIOGRAM  12/2017; 04/15/2018   a) Improved LVEF to 45 to 50%.  Pulmonary HTN ~PAP ~ 60 mmHg. Severe LA dilation& mild /rv dilation.; b) Mod LVH. EF 40-45% (back to prior EF). Unable to assess DF (Afib). Severe R&L Atrial enlargement.  Moderate Post-Lateral directed MR with Mod RV dilation.  PAP ~66 mmHg.;   . TRANSTHORACIC ECHOCARDIOGRAM  01/2019   relook Echo: EF back up to 50 to 55%.  Indeterminate diastolic parameters.  Moderately dilated left atrium and right atrium.  Moderate MR.  Mild to moderate TR.   Social History   Social History Narrative   She is a married mother of 9, grandmother 2. Usually accompanied by her husband. She does not work. She had been working on her exercise, but is no longer as active. Does not  drink and does not smoke   Immunization History  Administered Date(s) Administered  . Influenza, High Dose Seasonal PF 03/26/2016, 03/19/2017  . Influenza,inj,quad, With Preservative 03/18/2017  . Pneumococcal Polysaccharide-23 07/13/2011     Objective: Vital Signs: There were no vitals taken for this visit.   Physical Exam   Musculoskeletal Exam: ***  CDAI Exam: CDAI Score: -- Patient Global: --; Provider Global: -- Swollen: --; Tender: -- Joint Exam 02/23/2020   No joint exam has been documented for this visit   There is currently no information documented on the homunculus. Go to the Rheumatology activity and complete the homunculus joint exam.  Investigation: No additional findings.  Imaging: No results found.  Recent Labs: Lab Results  Component Value Date   WBC 8.2 05/04/2018   HGB 8.9 (L) 05/04/2018   PLT 254 05/04/2018   NA 145 (H) 10/19/2019   K 4.2 10/19/2019   CL 106 10/19/2019  CO2 23 10/19/2019   GLUCOSE 135 (H) 10/19/2019   BUN 32 (H) 10/19/2019   CREATININE 1.17 (H) 10/19/2019   BILITOT 0.8 10/11/2019   ALKPHOS 85 10/11/2019   AST 33 10/11/2019   ALT 33 (H) 10/11/2019   PROT 7.5 10/11/2019   ALBUMIN 4.5 10/11/2019   CALCIUM 9.8 10/19/2019   GFRAA 55 (L) 10/19/2019  November 29, 2019 uric acid 5.1, RF negative, anti-CCP negative  Speciality Comments: No specialty comments available.  Procedures:  No procedures performed Allergies: Patient has no known allergies.   Assessment / Plan:     Visit Diagnoses: No diagnosis found.  Orders: No orders of the defined types were placed in this encounter.  No orders of the defined types were placed in this encounter.   Face-to-face time spent with patient was *** minutes. Greater than 50% of time was spent in counseling and coordination of care.  Follow-Up Instructions: No follow-ups on file.   Bo Merino, MD  Note - This record has been created using Editor, commissioning.  Chart creation  errors have been sought, but may not always  have been located. Such creation errors do not reflect on  the standard of medical care.

## 2020-02-21 ENCOUNTER — Other Ambulatory Visit: Payer: Self-pay | Admitting: Cardiology

## 2020-02-23 ENCOUNTER — Ambulatory Visit: Payer: Medicare Other | Admitting: Rheumatology

## 2020-03-10 ENCOUNTER — Other Ambulatory Visit: Payer: Self-pay | Admitting: Cardiology

## 2020-04-02 ENCOUNTER — Telehealth: Payer: Self-pay | Admitting: *Deleted

## 2020-04-02 NOTE — Telephone Encounter (Signed)
RN called and  confirmed with patient's husband for upcoming virtual visit 04/04/20 and instruction given  Verbalized understanding

## 2020-04-04 ENCOUNTER — Telehealth (INDEPENDENT_AMBULATORY_CARE_PROVIDER_SITE_OTHER): Payer: Medicare Other | Admitting: Cardiology

## 2020-04-04 ENCOUNTER — Encounter: Payer: Self-pay | Admitting: Cardiology

## 2020-04-04 ENCOUNTER — Telehealth: Payer: Self-pay | Admitting: *Deleted

## 2020-04-04 VITALS — BP 122/66 | HR 56 | Ht 63.0 in | Wt 204.6 lb

## 2020-04-04 DIAGNOSIS — I340 Nonrheumatic mitral (valve) insufficiency: Secondary | ICD-10-CM | POA: Diagnosis not present

## 2020-04-04 DIAGNOSIS — Z9861 Coronary angioplasty status: Secondary | ICD-10-CM

## 2020-04-04 DIAGNOSIS — I5032 Chronic diastolic (congestive) heart failure: Secondary | ICD-10-CM | POA: Diagnosis not present

## 2020-04-04 DIAGNOSIS — I2510 Atherosclerotic heart disease of native coronary artery without angina pectoris: Secondary | ICD-10-CM

## 2020-04-04 DIAGNOSIS — N1831 Chronic kidney disease, stage 3a: Secondary | ICD-10-CM

## 2020-04-04 DIAGNOSIS — I10 Essential (primary) hypertension: Secondary | ICD-10-CM

## 2020-04-04 DIAGNOSIS — E1169 Type 2 diabetes mellitus with other specified complication: Secondary | ICD-10-CM

## 2020-04-04 DIAGNOSIS — I4821 Permanent atrial fibrillation: Secondary | ICD-10-CM

## 2020-04-04 DIAGNOSIS — E876 Hypokalemia: Secondary | ICD-10-CM

## 2020-04-04 DIAGNOSIS — E785 Hyperlipidemia, unspecified: Secondary | ICD-10-CM

## 2020-04-04 NOTE — Assessment & Plan Note (Signed)
EF back to low-normal based on most recent echo.  Last exacerbation was in 22-Aug-2017 when her son passed away.  She had a significant diuresis and is doing much better.  Now I think a lot of her edema is related to dietary discretion.  Because of low blood pressures, we stopped ACE inhibitor and ARB in the past, but as her pressures normalized may want to either consider restarting ARB versus spironolactone, depending on potassium levels.  For now we'll continue the Bystolic and Imdur.

## 2020-04-04 NOTE — Assessment & Plan Note (Signed)
Last creatinine was relatively normal.  Carefully monitor if we come to meeting aggressive diuresis

## 2020-04-04 NOTE — Telephone Encounter (Signed)
RN spoke to patient. Instruction were given  from today's virtual visit 04/04/20.  AVS SUMMARY has been sent by mychart . Lab slip mailed  recall placed for 6 month follow up   Patient verbalized understanding

## 2020-04-04 NOTE — Assessment & Plan Note (Signed)
Per guidelines with moderate MR, follow-up every 1 to 2 years.  No real change between 2019 and 2020 despite having exacerbation of CHF.  As such, I think he probably wait till next year to recheck.

## 2020-04-04 NOTE — Assessment & Plan Note (Signed)
Request does not notice being in A. fib.  Rate is controlled on Bystolic.  She is on Eliquis no bleeding.

## 2020-04-04 NOTE — Assessment & Plan Note (Signed)
Blood pressures are quite stable.  She stable dose of Bystolic and Imdur 60 mg along with standing dose of Lasix. No longer on ACE inhibitor, and we never started spironolactone.  Monitor pressures as well as potassium levels.

## 2020-04-04 NOTE — Patient Instructions (Addendum)
Medication Instructions:   NONE  *If you need a refill on your cardiac medications before your next appointment, please call your pharmacy*   Lab Work:  [place] (within 2-3 weeks)  If you have labs (blood work) drawn today and your tests are completely normal, you will receive your results only by: Marland Kitchen MyChart Message (if you have MyChart) OR . A paper copy in the mail If you have any lab test that is abnormal or we need to change your treatment, we will call you to review the results.   Testing/Procedures:  None  Follow-Up: At [facility], you and your health needs are our priority.  As part of our continuing mission to provide you with exceptional heart care, we have created designated Provider Care Teams.  These Care Teams include your primary Cardiologist (physician) and Advanced Practice Providers (APPs -  Physician Assistants and Nurse Practitioners) who all work together to provide you with the care you need, when you need it.    Your next appointment:   6 month(s)  The format for your next appointment:   In Person  Provider:   Glenetta Hew, MD   Other Instructions REMEMBER to get out & do some exercise / walking  Continue to make that shower safe.

## 2020-04-04 NOTE — Assessment & Plan Note (Signed)
Labs checked after last clinic visit great.  LDL 65.  Has been in the 23s for the last year.  Plan: Continue Zetia and atorvastatin.

## 2020-04-04 NOTE — Progress Notes (Signed)
Virtual Visit via Telephone Note   This visit type was conducted due to national recommendations for restrictions regarding the COVID-19 Pandemic (e.g. social distancing) in an effort to limit this patient's exposure and mitigate transmission in our community.  Due to her co-morbid illnesses, this patient is at least at moderate risk for complications without adequate follow up.  This format is felt to be most appropriate for this patient at this time.  The patient did not have access to video technology/had technical difficulties with video requiring transitioning to audio format only (telephone).  All issues noted in this document were discussed and addressed.  No physical exam could be performed with this format.  Please refer to the patient's chart for her  consent to telehealth for [facility].   Patient has given verbal permission to conduct this visit via virtual appointment and to bill insurance 04/04/2020 1:25 PM     Evaluation Performed:  Follow-up visit  Date:  04/04/2020   ID:  Stephanie Franco, DOB 04-10-1951, MRN [id number]  Patient Location: Home Provider Location: Home Office  PCP:  Lin Landsman, MD  Cardiologist:  Glenetta Hew, MD  Electrophysiologist:  None   Chief Complaint:  No chief complaint on file.   History of Present Illness:    Stephanie Franco is a 69 y.o. female with PMH notable for PERMANENT ATRIAL FIBRILLATION, Chronic HFrEF, CAD and PCI who presents via audio/video conferencing for a telehealth visit today as a 62-monthfollow-up.  Problem List Items Addressed This Visit    Morbid obesity ([place] (Chronic)   CAD S/P percutaneous coronary angioplasty (Chronic)   Permanent atrial fibrillation: CHA2DS2-VASc Score 6 - Primary (Chronic)   Chronic heart failure with preserved ejection fraction (HFpEF) (HCC) (Chronic)   Moderate mitral regurgitation (Chronic)   Essential hypertension (Chronic)   Hyperlipidemia associated with type 2 diabetes mellitus  (HCC) (Chronic)   Chronic kidney disease (CKD), stage III (moderate) [place] ([place]ronic)   Hypokalemia     DGENEVRA ORNEwas last seen in telemedicine on 10/12/2019.  As usual, both her as well as her husband.  They both acknowledge that they were not doing excellent job with her diet.  Her baseline weight was therefore prolonged but usually between 4 to 6 pound range.  She states that she was taking metolazone maybe 2 or 3 days a week. -> She had been noting chest pain for the preceding couple days where she took nitroglycerin for.  This occurred while she was lying down watching TV.  She thought was very indigestion but wasn't sure.  Nitroglycerin may have helped but also belching helps.  No dyspnea on exertion with shopping.  No PND orthopnea.  Swelling well controlled.  Sleeps on 2 pillows for comfort. -> She really hasn't noticed the A. fib, rate is not fast. -> Still having some grieving over the loss of her son last year, but overall better.  Still somewhat stressful  Myoview stress test ordered to evaluate chest pain  Diuretic regimen limited by hypokalemia.  (Likely made worse with having stopped ACE inhibitor) consideration of spironolactone.  Hospitalizations:  . None  Recent - Interim CV studies:    The following studies were reviewed today:  . Lexiscan Myoview 10/26/2019: LOW RISK.  Normal rest and stress perfusion.  No evidence of ischemia or infarction.  Non-gated because of A. fib.  Inerval History   Stephanie Stephanie Franco doing well.  Swelling is doing better.  She has been drinking Pineapple juice.  Taking metolazone (the kicker) with 2 furosemide tabs  2-3 x/ week - not on days that she is going out. (May or may not take a 3rd tablet that day depending on edema). Other days takes 80 mg BID furosemide - occasionally 2 tabs in AM.    No more CP using "ant-acid tablet" - takes before she eats. Takes 2 BID Klor-Con when taking Metolazone & 1 BID when not.  Drinking water. Trying to  cut down salty foods.   Still no sensation of Afib.  No PND/orthopnea with well controlled edema.  No real DOE either - especially with trying to keep weight down. Has been able to keep wgt ~ 202-206 lb. Highest wgt 210 lb.    Not doing that well with exercising b/c not going to [employer]. Realizes that she is not doing enough - but keeps procrastinating.   They do go shopping a lot & she can get a decent amount of exercise that way.   Husband definitely notes lack of orthopnea / hypopnea.  Cardiovascular ROS: no chest pain or dyspnea on exertion positive for - well controlled edema negative for - irregular heartbeat, orthopnea, palpitations, paroxysmal nocturnal dyspnea, rapid heart rate, shortness of breath or syncope/near syncope; TIA/amaurosis fugax; claudication  ROS:  Please see the history of present illness.    The patient does not have symptoms concerning for COVID-19 infection (fever, chills, cough, or new shortness of breath).  Review of Systems  Constitutional: Negative for malaise/fatigue (just not doing as much exercise).  HENT: Negative for congestion and nosebleeds.   Eyes: Negative for blurred vision.  Respiratory: Negative for shortness of breath.   Cardiovascular: Positive for leg swelling (controlled).  Gastrointestinal: Positive for constipation (OFF & ON). Negative for blood in stool.  Genitourinary: Positive for frequency (with diuretics). Negative for hematuria.  Musculoskeletal: Positive for joint pain (Gout).  Neurological: Positive for dizziness (had 1-2 episodes of dizziness in shower - Neurologist gave suggestions, shower seat, safety bars.). Negative for focal weakness and headaches.  Psychiatric/Behavioral: Negative for memory loss. The patient is not nervous/anxious and does not have insomnia.    The patient is practicing social distancing.  Past Medical History:  Diagnosis Date  . Aortic atherosclerosis ([place])   . Arthritis   . Asthma   . Atrial  fibrillation, permanent ([place])    Rate control with Bystolic. CHA2DS2Vasc = 6 (HTN, DM, CHF, Age 36, Female) -> on Pradaxa  . Breast cancer ([place]) 2008   S/P mastectomy  . CAD S/P percutaneous coronary angioplasty 2006;    PCI of circumflex with Taxus DES;; relook-cath Feb 2013: 50-60% short lesion in RCA, 40% ISR Circumflex stent.  . CKD (chronic kidney disease), stage III ([place])   . Complication of anesthesia    prolonged sedation after colonoscopy in [place]  . Diabetes mellitus, uncontrolled 07/14/2011  . Dilated cardiomyopathy ([place])    Mostly Resolved (EF up from ~25% to ~45% by Echo)  . DOE (dyspnea on exertion)   . Edema of both legs    Usually mild, chronic. Controlled with when necessary furosemide and diet  . 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 GERD (gastroesophageal reflux disease)   . Gout   . Hyperlipidemia   . Hypertension   . Hypokalemia   . Morbid obesity (HCC)    BMI 41  . OSA on CPAP    On CPAP  . Pulmonary hypertension, unspecified ([place])    PAP ~90 mmHg on Echo 12/2017 --  has OSA on CPAP & obesity - but Overall Improved Sx of dyspnea / edema  . Systolic murmur    Past Surgical History:  Procedure Laterality Date  . COLONOSCOPY    . CORONARY ANGIOPLASTY WITH STENT PLACEMENT  2006   stent to circumflex  . DILATION AND CURETTAGE OF UTERUS    . LEFT HEART CATHETERIZATION WITH CORONARY ANGIOGRAM N/A 07/13/2011   Procedure: LEFT HEART CATHETERIZATION WITH CORONARY ANGIOGRAM;  Surgeon: Leonie Man, MD;  Location: [facility] CATH LAB;  Service: Cardiovascular;  normal EF; 69m 50-60% lesion in RCA; patent stent in circumflex form 2006 with ~40% in-stent re-stenosis  . MASTECTOMY  2008   left  . NM MYOVIEW LTD  7/'16; 6/'21   LOW RISK. No ischemia or infarction. Not gated 2/2  A. fib - unable to assess EF  . TOTAL HIP ARTHROPLASTY Right 01/19/2018   Procedure: RIGHT TOTAL HIP ARTHROPLASTY ANTERIOR APPROACH;  Surgeon: SRod Can MD;  Location: [facility] ORS;  Service:  Orthopedics;  Laterality: Right;  Needs RNFA  . TRANSTHORACIC ECHOCARDIOGRAM  6/'15; 9/'16   a) In setting of Urosepsis: EF ~25 %, global HK (poor quality study);; b) LV EF 40-45%, Mild Anteroseptal HK. Mod-Severe  RA dilation with elevated PA Pressures (~~peak 63 mmHg).. Mild LA dilation  . TRANSTHORACIC ECHOCARDIOGRAM  12/2017; 04/15/2018   a) Improved LVEF to 45 to 50%.  Pulmonary HTN ~PAP ~ 60 mmHg. Severe LA dilation& mild /rv dilation.; b) Mod LVH. EF 40-45% (back to prior EF). Unable to assess DF (Afib). Severe R&L Atrial enlargement.  Moderate Post-Lateral directed MR with Mod RV dilation.  PAP ~66 mmHg.;   . TRANSTHORACIC ECHOCARDIOGRAM  01/2019   relook Echo: EF back up to 50 to 55%.  Indeterminate diastolic parameters.  Moderately dilated left atrium and right atrium.  Moderate MR.  Mild to moderate TR.     Current Meds  Medication Sig  . albuterol (PROVENTIL HFA;VENTOLIN HFA) 108 (90 Base) MCG/ACT inhaler Inhale 1-2 puffs into the lungs every 6 (six) hours as needed for wheezing or shortness of breath.  .Marland Kitchenalbuterol (PROVENTIL) (2.5 MG/3ML) 0.083% nebulizer solution Take 3 mLs (2.5 mg total) by nebulization every 6 (six) hours as needed for wheezing or shortness of breath.  . allopurinol (ZYLOPRIM) 300 MG tablet Take 300 mg by mouth daily.   .Marland Kitchenatorvastatin (LIPITOR) 40 MG tablet TAKE 1 TABLET BY MOUTH EVERY DAY  . blood glucose meter kit and supplies KIT Dispense based on patient and insurance preference. Use up to four times daily as directed. (FOR ICD-9 250.00, 250.01).  . BYSTOLIC 10 MG tablet TAKE 1 TABLET BY MOUTH EVERY DAY  . Colchicine (MITIGARE) 0.6 MG CAPS Take 1 tablet by mouth daily.   .Marland KitchenELIQUIS 5 MG TABS tablet TAKE 1 TABLET BY MOUTH TWICE A DAY  . ezetimibe (ZETIA) 10 MG tablet Take 1 tablet (10 mg total) by mouth daily.  . ferrous sulfate 325 (65 FE) MG EC tablet Take 325 mg by mouth daily with breakfast.  . fexofenadine (ALLEGRA) 180 MG tablet Take 180 mg by mouth daily  as needed for allergies.   . fluticasone (FLONASE) 50 MCG/ACT nasal spray Place 2 sprays into the nose daily as needed for allergies or rhinitis.   . furosemide (LASIX) 80 MG tablet TAKE 1 TABLET BY MOUTH TWICE A DAY  . gabapentin (NEURONTIN) 600 MG tablet Take 600 mg by mouth daily. Take 600 mg by mouth daily  . glucose blood (ACCU-CHEK SMARTVIEW) test  strip 1 each by Other route as needed for other. Use as instructed  . Insulin Glargine (TOUJEO SOLOSTAR) 300 UNIT/ML SOPN Inject 40 Units into the skin 2 (two) times daily before a meal.   . isosorbide mononitrate (IMDUR) 60 MG 24 hr tablet Take 1 tablet (60 mg total) by mouth daily.  Marland Kitchen LORazepam (ATIVAN) 1 MG tablet Take 1 mg by mouth See admin instructions. Take 1 mg by mouth two times a day- morning and evening  . metolazone (ZAROXOLYN) 5 MG tablet TAKE 1 TABLET 30 MINUTES BEFORE LASIX DAILY AS NEEDED  . Multiple Vitamins-Minerals (MULTIVITAMIN WITH MINERALS) tablet Take 1 tablet by mouth daily.  . nitroGLYCERIN (NITROSTAT) 0.4 MG SL tablet DISSOLVE 1 TABLET (0.4 MG TOTAL) UNDER THE TONGUE EVERY 5 (FIVE) MINUTES AS NEEDED FOR CHEST PAIN.  Marland Kitchen pantoprazole (PROTONIX) 40 MG tablet TAKE 1 TABLET BY MOUTH EVERY DAY  . potassium chloride SA (KLOR-CON M20) 20 MEQ tablet TAKE 1 TABLET BY MOUTH TWICE A DAY MAY TAKE ADDITIONAL 2TABS AS NEEDED/DIRECTED  . triamcinolone ointment (KENALOG) 0.5 % Apply to legs 2 times daily for 2 weeks (Patient taking differently: Apply 1 application topically See admin instructions. Apply to legs 2 times daily for 2 weeks)     Immunization History  Administered Date(s) Administered  . Influenza, High Dose Seasonal PF 03/26/2016, 03/19/2017  . Influenza,inj,quad, With Preservative 03/18/2017  . Pneumococcal Polysaccharide-23 07/13/2011    All 3 Phizer shots - did get chills x 24 hr with Booster.   Allergies:   Patient has no known allergies.   Social History   Tobacco Use  . Smoking status: Former Smoker     Packs/day: 0.50    Years: 6.00    Pack years: 3.00    Types: Cigarettes    Quit date: 05/18/1992    Years since quitting: 27.8  . Smokeless tobacco: Never Used  Vaping Use  . Vaping Use: Never used  Substance Use Topics  . Alcohol use: No    Comment: 07/13/11 "have drank occasionally; not now"  . Drug use: No    Going to a grief support group - helps with grieving death of son.  Family Hx: The patient's family history includes Atrial fibrillation in her son; Coronary artery disease in her mother; Healthy in her daughter, daughter, and son; Heart disease in her brother, brother, and father; Hypertension in her mother; Lung cancer in her sister; Thyroid disease in her daughter.   Labs/Other Tests and Data Reviewed:    EKG:  No ECG reviewed.  Recent Labs: 10/11/2019: ALT 33 10/19/2019: BUN 32; Creatinine, Ser 1.17; Potassium 4.2; Sodium 145  Lab Results  Component Value Date   HGBA1C 6.9 (H) 10/17/2019   Recent Lipid Panel Lab Results  Component Value Date/Time   CHOL 130 10/11/2019 09:52 AM   TRIG 129 10/11/2019 09:52 AM   HDL 42 10/11/2019 09:52 AM   CHOLHDL 3.1 10/11/2019 09:52 AM   CHOLHDL 3.3 05/19/2017 08:15 AM   LDLCALC 65 10/11/2019 09:52 AM   LDLCALC 61 05/19/2017 08:15 AM    Wt Readings from Last 3 Encounters:  04/04/20 204 lb 9.6 oz (92.8 kg)  11/29/19 218 lb 3.2 oz (99 kg)  10/26/19 206 lb (93.4 kg)     Objective:    Vital Signs:  BP 122/66   Pulse (!) 56   Ht 5' 3"  (1.6 m)   Wt 204 lb 9.6 oz (92.8 kg)   BMI 36.24 kg/m   VITAL SIGNS:  reviewed  Pleasant female in no acute distress. A&O x 3.  Normal Mood & Affect Non-labored respirations   ASSESSMENT & PLAN:    Problem List Items Addressed This Visit    Morbid obesity ([place]) (Chronic)    She is getting exercise when she goes out shopping, but has not gotten back into the regimen of true exercise.  Still reluctant to go to the gym.  Thankfully, she is being able to maintain her weight and roughly  200-204 pounds.      CAD S/P percutaneous coronary angioplasty (Chronic)   Permanent atrial fibrillation: CHA2DS2-VASc Score 6 (Chronic)    Request does not notice being in A. fib.  Rate is controlled on Bystolic.  She is on Eliquis no bleeding.      Chronic heart failure with preserved ejection fraction (HFpEF) (HCC) - Primary (Chronic)    EF back to low-normal based on most recent echo.  Last exacerbation was in July 18, 2017 when her son passed away.  She had a significant diuresis and is doing much better.  Now I think a lot of her edema is related to dietary discretion.  Because of low blood pressures, we stopped ACE inhibitor and ARB in the past, but as her pressures normalized may want to either consider restarting ARB versus spironolactone, depending on potassium levels.  For now we'll continue the Bystolic and Imdur.      Moderate mitral regurgitation (Chronic)    Per guidelines with moderate MR, follow-up every 1 to 2 years.  No real change between July 18, 2017 and 07/18/18 despite having exacerbation of CHF.  As such, I think he probably wait till next year to recheck.      Essential hypertension (Chronic)    Blood pressures are quite stable.  She stable dose of Bystolic and Imdur 60 mg along with standing dose of Lasix. No longer on ACE inhibitor, and we never started spironolactone.  Monitor pressures as well as potassium levels.      Hyperlipidemia associated with type 2 diabetes mellitus ([place]) (Chronic)    Labs checked after last clinic visit great.  LDL 65.  Has been in the 28s for the last year.  Plan: Continue Zetia and atorvastatin.      Chronic kidney disease (CKD), stage III (moderate) (HCC) (Chronic)    Last creatinine was relatively normal.  Carefully monitor if we come to meeting aggressive diuresis      Hypokalemia    Follow-up labs show potassium 4.2. If she does have to use increased dose of p.o. Zaroxolyn for edema, probably need to reassess potassium.   Take extra  potassium supplementation with every dose of metolazone.         COVID-19 Education: The signs and symptoms of COVID-19 were discussed with the patient and how to seek care for testing (follow up with PCP or arrange E-visit).   The importance of social distancing was discussed today.  Time:   Today, I have spent 29 minutes with the patient with telehealth technology discussing the above problems. 8 min charting. Total 37 min   Medication Adjustments/Labs and Tests Ordered: Current medicines are reviewed at length with the patient today.  Concerns regarding medicines are outlined above.   Patient Instructions  Medication Instructions:   NONE  *If you need a refill on your cardiac medications before your next appointment, please call your pharmacy*   Lab Work:  [place] (within 2-3 weeks)  If you have labs (blood work) drawn today and your tests are completely  normal, you will receive your results only by: Marland Kitchen MyChart Message (if you have MyChart) OR . A paper copy in the mail If you have any lab test that is abnormal or we need to change your treatment, we will call you to review the results.   Testing/Procedures:  None  Follow-Up: At [facility], you and your health needs are our priority.  As part of our continuing mission to provide you with exceptional heart care, we have created designated Provider Care Teams.  These Care Teams include your primary Cardiologist (physician) and Advanced Practice Providers (APPs -  Physician Assistants and Nurse Practitioners) who all work together to provide you with the care you need, when you need it.  We recommend signing up for the patient portal called "MyChart".  Sign up information is provided on this After Visit Summary.  MyChart is used to connect with patients for Virtual Visits (Telemedicine).  Patients are able to view lab/test results, encounter notes, upcoming appointments, etc.  Non-urgent messages can be sent to your  provider as well.   To learn more about what you can do with MyChart, go to [email or web address].    Your next appointment:   6 month(s)  The format for your next appointment:   In Person  Provider:   Glenetta Hew, MD   Other Instructions REMEMBER to get out & do some exercise / walking  Continue to make that shower safe.    Signed, Glenetta Hew, MD  04/04/2020 1:25 PM    [facility]

## 2020-04-04 NOTE — Assessment & Plan Note (Signed)
Follow-up labs show potassium 4.2. If she does have to use increased dose of p.o. Zaroxolyn for edema, probably need to reassess potassium.   Take extra potassium supplementation with every dose of metolazone.

## 2020-04-04 NOTE — Assessment & Plan Note (Signed)
She is getting exercise when she goes out shopping, but has not gotten back into the regimen of true exercise.  Still reluctant to go to the gym.  Thankfully, she is being able to maintain her weight and roughly 200-204 pounds.

## 2020-04-06 ENCOUNTER — Encounter: Payer: Self-pay | Admitting: Cardiology

## 2020-04-26 NOTE — Progress Notes (Deleted)
Office Visit Note  Patient: Stephanie Franco             Date of Birth: 05-Jul-1950           MRN: [id number]             PCP: Lin Landsman, MD Referring: Lin Landsman, MD Visit Date: 05/08/2020 Occupation: @GUAROCC @  Subjective:  No chief complaint on file.   History of Present Illness: Stephanie Franco is a 69 y.o. female ***   Activities of Daily Living:  Patient reports morning stiffness for *** {minute/hour:19697}.   Patient {ACTIONS;DENIES/REPORTS:21021675::"Denies"} nocturnal pain.  Difficulty dressing/grooming: {ACTIONS;DENIES/REPORTS:21021675::"Denies"} Difficulty climbing stairs: {ACTIONS;DENIES/REPORTS:21021675::"Denies"} Difficulty getting out of chair: {ACTIONS;DENIES/REPORTS:21021675::"Denies"} Difficulty using hands for taps, buttons, cutlery, and/or writing: {ACTIONS;DENIES/REPORTS:21021675::"Denies"}  No Rheumatology ROS completed.   PMFS History:  Patient Active Problem List   Diagnosis Date Noted  . Generalized joint pain 11/29/2019  . Chest pain with moderate risk for cardiac etiology 10/12/2019  . History of total hip arthroplasty 06/01/2018  . Itching 05/02/2018  . Acute on chronic combined systolic and diastolic CHF (congestive heart failure) ([place]) 04/16/2018  . GERD (gastroesophageal reflux disease) 04/15/2018  . Gout 04/15/2018  . Hypomagnesemia 04/15/2018  . Primary osteoarthritis of right hip 01/19/2018  . Pulmonary hypertension ([place]) 01/06/2018  . Moderate mitral regurgitation 12/10/2017  . Preoperative cardiovascular examination 12/10/2017  . Osteoarthritis of right hip 06/22/2017  . Abnormal serum creatinine level 05/31/2017  . Medication management 11/16/2016  . Dilated cardiomyopathy ([place]) 01/29/2015  . Chronic heart failure with preserved ejection fraction (HFpEF) ([place])   . Hypokalemia 10/31/2014  . Pyelonephritis 10/31/2014  . Abnormal liver function 10/31/2014  . Chronic kidney disease (CKD), stage III (moderate) (HCC)   . Cramps  of lower extremity 06/24/2013  . CAD S/P percutaneous coronary angioplasty   . Permanent atrial fibrillation: CHA2DS2-VASc Score 6   . Edema of lower extremity   . Diabetes mellitus ([place]) 07/14/2011  . Morbid obesity ([place]) 07/12/2011  . Uterine leiomyoma 07/12/2011  . Hyperlipidemia associated with type 2 diabetes mellitus ([place]) 07/12/2011  . Obstructive sleep apnea syndrome 07/12/2011  . Essential hypertension     Past Medical History:  Diagnosis Date  . Aortic atherosclerosis ([place])   . Arthritis   . Asthma   . Atrial fibrillation, permanent ([place])    Rate control with Bystolic. CHA2DS2Vasc = 6 (HTN, DM, CHF, Age 21, Female) -> on Pradaxa  . Breast cancer ([place]) 2008   S/P mastectomy  . CAD S/P percutaneous coronary angioplasty 2006;    PCI of circumflex with Taxus DES;; relook-cath Feb 2013: 50-60% short lesion in RCA, 40% ISR Circumflex stent.  . CKD (chronic kidney disease), stage III ([place])   . Complication of anesthesia    prolonged sedation after colonoscopy in [place]  . Diabetes mellitus, uncontrolled 07/14/2011  . Dilated cardiomyopathy ([place])    Mostly Resolved (EF up from ~25% to ~45% by Echo)  . DOE (dyspnea on exertion)   . Edema of both legs    Usually mild, chronic. Controlled with when necessary furosemide and diet  . 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 GERD (gastroesophageal reflux disease)   . Gout   . Hyperlipidemia   . Hypertension   . Hypokalemia   . Morbid obesity (HCC)    BMI 41  . OSA on CPAP    On CPAP  . Pulmonary hypertension, unspecified ([place])    PAP ~90 mmHg on Echo 12/2017 -- has OSA on  CPAP & obesity - but Overall Improved Sx of dyspnea / edema  . Systolic murmur     Family History  Problem Relation Age of Onset  . Coronary artery disease Mother   . Hypertension Mother   . Heart disease Father   . Atrial fibrillation Son   . Lung cancer Sister   . Heart disease Brother   . Heart disease Brother   . Healthy Son   . Thyroid  disease Daughter   . Healthy Daughter   . Healthy Daughter    Past Surgical History:  Procedure Laterality Date  . COLONOSCOPY    . CORONARY ANGIOPLASTY WITH STENT PLACEMENT  2006   stent to circumflex  . DILATION AND CURETTAGE OF UTERUS    . LEFT HEART CATHETERIZATION WITH CORONARY ANGIOGRAM N/A 07/13/2011   Procedure: LEFT HEART CATHETERIZATION WITH CORONARY ANGIOGRAM;  Surgeon: Leonie Man, MD;  Location: [facility] CATH LAB;  Service: Cardiovascular;  normal EF; 11mm 50-60% lesion in RCA; patent stent in circumflex form 2006 with ~40% in-stent re-stenosis  . MASTECTOMY  2008   left  . NM MYOVIEW LTD  7/'16; 6/'21   LOW RISK. No ischemia or infarction. Not gated 2/2  A. fib - unable to assess EF  . TOTAL HIP ARTHROPLASTY Right 01/19/2018   Procedure: RIGHT TOTAL HIP ARTHROPLASTY ANTERIOR APPROACH;  Surgeon: Rod Can, MD;  Location: [facility] ORS;  Service: Orthopedics;  Laterality: Right;  Needs RNFA  . TRANSTHORACIC ECHOCARDIOGRAM  6/'15; 9/'16   a) In setting of Urosepsis: EF ~25 %, global HK (poor quality study);; b) LV EF 40-45%, Mild Anteroseptal HK. Mod-Severe  RA dilation with elevated PA Pressures (~~peak 63 mmHg).. Mild LA dilation  . TRANSTHORACIC ECHOCARDIOGRAM  12/2017; 04/15/2018   a) Improved LVEF to 45 to 50%.  Pulmonary HTN ~PAP ~ 60 mmHg. Severe LA dilation& mild /rv dilation.; b) Mod LVH. EF 40-45% (back to prior EF). Unable to assess DF (Afib). Severe R&L Atrial enlargement.  Moderate Post-Lateral directed MR with Mod RV dilation.  PAP ~66 mmHg.;   . TRANSTHORACIC ECHOCARDIOGRAM  01/2019   relook Echo: EF back up to 50 to 55%.  Indeterminate diastolic parameters.  Moderately dilated left atrium and right atrium.  Moderate MR.  Mild to moderate TR.   Social History   Social History Narrative   She is a married mother of 6, grandmother 2. Usually accompanied by her husband. She does not work. She had been working on her exercise, but is no longer as active. Does not drink and  does not smoke   Immunization History  Administered Date(s) Administered  . Influenza, High Dose Seasonal PF 03/26/2016, 03/19/2017  . Influenza,inj,quad, With Preservative 03/18/2017  . Pneumococcal Polysaccharide-23 07/13/2011     Objective: Vital Signs: There were no vitals taken for this visit.   Physical Exam   Musculoskeletal Exam: ***  CDAI Exam: CDAI Score: -- Patient Global: --; Provider Global: -- Swollen: --; Tender: -- Joint Exam 05/08/2020   No joint exam has been documented for this visit   There is currently no information documented on the homunculus. Go to the Rheumatology activity and complete the homunculus joint exam.  Investigation: No additional findings.  Imaging: No results found.  Recent Labs: Lab Results  Component Value Date   WBC 8.2 05/04/2018   HGB 8.9 (L) 05/04/2018   PLT 254 05/04/2018   NA 145 (H) 10/19/2019   K 4.2 10/19/2019   CL 106 10/19/2019   CO2 23 10/19/2019  GLUCOSE 135 (H) 10/19/2019   BUN 32 (H) 10/19/2019   CREATININE 1.17 (H) 10/19/2019   BILITOT 0.8 10/11/2019   ALKPHOS 85 10/11/2019   AST 33 10/11/2019   ALT 33 (H) 10/11/2019   PROT 7.5 10/11/2019   ALBUMIN 4.5 10/11/2019   CALCIUM 9.8 10/19/2019   GFRAA 55 (L) 10/19/2019  November 29, 2019 uric acid 5.1, RF negative, anti-CCP negative  Speciality Comments: No specialty comments available.  Procedures:  No procedures performed Allergies: Patient has no known allergies.   Assessment / Plan:     Visit Diagnoses: No diagnosis found.  Orders: No orders of the defined types were placed in this encounter.  No orders of the defined types were placed in this encounter.   Face-to-face time spent with patient was *** minutes. Greater than 50% of time was spent in counseling and coordination of care.  Follow-Up Instructions: No follow-ups on file.   Bo Merino, MD  Note - This record has been created using Editor, commissioning.  Chart creation errors have  been sought, but may not always  have been located. Such creation errors do not reflect on  the standard of medical care.

## 2020-05-01 ENCOUNTER — Other Ambulatory Visit: Payer: Self-pay | Admitting: Cardiology

## 2020-05-08 ENCOUNTER — Ambulatory Visit: Payer: Medicare Other | Admitting: Rheumatology

## 2020-05-08 DIAGNOSIS — M109 Gout, unspecified: Secondary | ICD-10-CM

## 2020-05-28 NOTE — Progress Notes (Deleted)
Office Visit Note  Patient: Stephanie Franco             Date of Birth: 1951-04-02           MRN: [id number]             PCP: Lin Landsman, MD Referring: Lin Landsman, MD Visit Date: 06/11/2020 Occupation: @GUAROCC @  Subjective:  No chief complaint on file.   History of Present Illness: Stephanie Franco is a 70 y.o. female ***   Activities of Daily Living:  Patient reports morning stiffness for *** {minute/hour:19697}.   Patient {ACTIONS;DENIES/REPORTS:21021675::"Denies"} nocturnal pain.  Difficulty dressing/grooming: {ACTIONS;DENIES/REPORTS:21021675::"Denies"} Difficulty climbing stairs: {ACTIONS;DENIES/REPORTS:21021675::"Denies"} Difficulty getting out of chair: {ACTIONS;DENIES/REPORTS:21021675::"Denies"} Difficulty using hands for taps, buttons, cutlery, and/or writing: {ACTIONS;DENIES/REPORTS:21021675::"Denies"}  No Rheumatology ROS completed.   PMFS History:  Patient Active Problem List   Diagnosis Date Noted  . Generalized joint pain 11/29/2019  . Chest pain with moderate risk for cardiac etiology 10/12/2019  . History of total hip arthroplasty 06/01/2018  . Itching 05/02/2018  . Acute on chronic combined systolic and diastolic CHF (congestive heart failure) ([place]) 04/16/2018  . GERD (gastroesophageal reflux disease) 04/15/2018  . Gout 04/15/2018  . Hypomagnesemia 04/15/2018  . Primary osteoarthritis of right hip 01/19/2018  . Pulmonary hypertension ([place]) 01/06/2018  . Moderate mitral regurgitation 12/10/2017  . Preoperative cardiovascular examination 12/10/2017  . Osteoarthritis of right hip 06/22/2017  . Abnormal serum creatinine level 05/31/2017  . Medication management 11/16/2016  . Dilated cardiomyopathy ([place]) 01/29/2015  . Chronic heart failure with preserved ejection fraction (HFpEF) ([place])   . Hypokalemia 10/31/2014  . Pyelonephritis 10/31/2014  . Abnormal liver function 10/31/2014  . Chronic kidney disease (CKD), stage III (moderate) (HCC)   . Cramps  of lower extremity 06/24/2013  . CAD S/P percutaneous coronary angioplasty   . Permanent atrial fibrillation: CHA2DS2-VASc Score 6   . Edema of lower extremity   . Diabetes mellitus ([place]) 07/14/2011  . Morbid obesity ([place]) 07/12/2011  . Uterine leiomyoma 07/12/2011  . Hyperlipidemia associated with type 2 diabetes mellitus ([place]) 07/12/2011  . Obstructive sleep apnea syndrome 07/12/2011  . Essential hypertension     Past Medical History:  Diagnosis Date  . Aortic atherosclerosis ([place])   . Arthritis   . Asthma   . Atrial fibrillation, permanent ([place])    Rate control with Bystolic. CHA2DS2Vasc = 6 (HTN, DM, CHF, Age 2, Female) -> on Pradaxa  . Breast cancer ([place]) 2008   S/P mastectomy  . CAD S/P percutaneous coronary angioplasty 2006;    PCI of circumflex with Taxus DES;; relook-cath Feb 2013: 50-60% short lesion in RCA, 40% ISR Circumflex stent.  . CKD (chronic kidney disease), stage III ([place])   . Complication of anesthesia    prolonged sedation after colonoscopy in [place]  . Diabetes mellitus, uncontrolled 07/14/2011  . Dilated cardiomyopathy ([place])    Mostly Resolved (EF up from ~25% to ~45% by Echo)  . DOE (dyspnea on exertion)   . Edema of both legs    Usually mild, chronic. Controlled with when necessary furosemide and diet  . 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 GERD (gastroesophageal reflux disease)   . Gout   . Hyperlipidemia   . Hypertension   . Hypokalemia   . Morbid obesity (HCC)    BMI 41  . OSA on CPAP    On CPAP  . Pulmonary hypertension, unspecified ([place])    PAP ~90 mmHg on Echo 12/2017 -- has OSA on  CPAP & obesity - but Overall Improved Sx of dyspnea / edema  . Systolic murmur     Family History  Problem Relation Age of Onset  . Coronary artery disease Mother   . Hypertension Mother   . Heart disease Father   . Atrial fibrillation Son   . Lung cancer Sister   . Heart disease Brother   . Heart disease Brother   . Healthy Son   . Thyroid  disease Daughter   . Healthy Daughter   . Healthy Daughter    Past Surgical History:  Procedure Laterality Date  . COLONOSCOPY    . CORONARY ANGIOPLASTY WITH STENT PLACEMENT  2006   stent to circumflex  . DILATION AND CURETTAGE OF UTERUS    . LEFT HEART CATHETERIZATION WITH CORONARY ANGIOGRAM N/A 07/13/2011   Procedure: LEFT HEART CATHETERIZATION WITH CORONARY ANGIOGRAM;  Surgeon: Leonie Man, MD;  Location[facility] CATH LAB;  Service: Cardiovascular;  normal EF; 35mm 50-60% lesion in RCA; patent stent in circumflex form 2006 with ~40% in-stent re-stenosis  . MASTECTOMY  2008   left  . NM MYOVIEW LTD  7/'16; 6/'21   LOW RISK. No ischemia or infarction. Not gated 2/2  A. fib - unable to assess EF  . TOTAL HIP ARTHROPLASTY Right 01/19/2018   Procedure: RIGHT TOTAL HIP ARTHROPLASTY ANTERIOR APPROACH;  Surgeon: Rod Can, MD;  Location: [facility] ORS;  Service: Orthopedics;  Laterality: Right;  Needs RNFA  . TRANSTHORACIC ECHOCARDIOGRAM  6/'15; 9/'16   a) In setting of Urosepsis: EF ~25 %, global HK (poor quality study);; b) LV EF 40-45%, Mild Anteroseptal HK. Mod-Severe  RA dilation with elevated PA Pressures (~~peak 63 mmHg).. Mild LA dilation  . TRANSTHORACIC ECHOCARDIOGRAM  12/2017; 04/15/2018   a) Improved LVEF to 45 to 50%.  Pulmonary HTN ~PAP ~ 60 mmHg. Severe LA dilation& mild /rv dilation.; b) Mod LVH. EF 40-45% (back to prior EF). Unable to assess DF (Afib). Severe R&L Atrial enlargement.  Moderate Post-Lateral directed MR with Mod RV dilation.  PAP ~66 mmHg.;   . TRANSTHORACIC ECHOCARDIOGRAM  01/2019   relook Echo: EF back up to 50 to 55%.  Indeterminate diastolic parameters.  Moderately dilated left atrium and right atrium.  Moderate MR.  Mild to moderate TR.   Social History   Social History Narrative   She is a married mother of 67, grandmother 2. Usually accompanied by her husband. She does not work. She had been working on her exercise, but is no longer as active. Does not drink and  does not smoke   Immunization History  Administered Date(s) Administered  . Influenza, High Dose Seasonal PF 03/26/2016, 03/19/2017  . Influenza,inj,quad, With Preservative 03/18/2017  . Pneumococcal Polysaccharide-23 07/13/2011     Objective: Vital Signs: There were no vitals taken for this visit.   Physical Exam   Musculoskeletal Exam: ***  CDAI Exam: CDAI Score: -- Patient Global: --; Provider Global: -- Swollen: --; Tender: -- Joint Exam 06/11/2020   No joint exam has been documented for this visit   There is currently no information documented on the homunculus. Go to the Rheumatology activity and complete the homunculus joint exam.  Investigation: No additional findings.  Imaging: No results found.  Recent Labs: Lab Results  Component Value Date   WBC 8.2 05/04/2018   HGB 8.9 (L) 05/04/2018   PLT 254 05/04/2018   NA 145 (H) 10/19/2019   K 4.2 10/19/2019   CL 106 10/19/2019   CO2 23 10/19/2019  GLUCOSE 135 (H) 10/19/2019   BUN 32 (H) 10/19/2019   CREATININE 1.17 (H) 10/19/2019   BILITOT 0.8 10/11/2019   ALKPHOS 85 10/11/2019   AST 33 10/11/2019   ALT 33 (H) 10/11/2019   PROT 7.5 10/11/2019   ALBUMIN 4.5 10/11/2019   CALCIUM 9.8 10/19/2019   GFRAA 55 (L) 10/19/2019   November 29, 2019 uric acid 5.1, RF negative, anti-CCP negative  Speciality Comments: No specialty comments available.  Procedures:  No procedures performed Allergies: Patient has no known allergies.   Assessment / Plan:     Visit Diagnoses: No diagnosis found.  Orders: No orders of the defined types were placed in this encounter.  No orders of the defined types were placed in this encounter.   Face-to-face time spent with patient was *** minutes. Greater than 50% of time was spent in counseling and coordination of care.  Follow-Up Instructions: No follow-ups on file.   Bo Merino, MD  Note - This record has been created using Editor, commissioning.  Chart creation errors  have been sought, but may not always  have been located. Such creation errors do not reflect on  the standard of medical care.

## 2020-05-29 ENCOUNTER — Ambulatory Visit: Payer: Medicare Other | Admitting: Rheumatology

## 2020-05-29 ENCOUNTER — Telehealth: Payer: Self-pay | Admitting: Pharmacist

## 2020-05-29 DIAGNOSIS — E876 Hypokalemia: Secondary | ICD-10-CM

## 2020-05-29 LAB — COMPREHENSIVE METABOLIC PANEL
ALT: 30 IU/L (ref 0–32)
AST: 32 IU/L (ref 0–40)
Albumin/Globulin Ratio: 1.7 (ref 1.2–2.2)
Albumin: 4.6 g/dL (ref 3.7–4.7)
Alkaline Phosphatase: 82 IU/L (ref 44–121)
BUN/Creatinine Ratio: 29 — ABNORMAL HIGH (ref 12–28)
BUN: 51 mg/dL — ABNORMAL HIGH (ref 8–27)
Bilirubin Total: 0.7 mg/dL (ref 0.0–1.2)
CO2: 29 mmol/L (ref 20–29)
Calcium: 9.8 mg/dL (ref 8.7–10.3)
Chloride: 97 mmol/L (ref 96–106)
Creatinine, Ser: 1.76 mg/dL — ABNORMAL HIGH (ref 0.57–1.00)
GFR calc Af Amer: 33 mL/min/{1.73_m2} — ABNORMAL LOW (ref >59)
GFR calc non Af Amer: 29 mL/min/{1.73_m2} — ABNORMAL LOW (ref >59)
Globulin, Total: 2.7 g/dL (ref 1.5–4.5)
Glucose: 167 mg/dL — ABNORMAL HIGH (ref 65–99)
Potassium: 2.8 mmol/L — CL (ref 3.5–5.2)
Sodium: 143 mmol/L (ref 134–144)
Total Protein: 7.3 g/dL (ref 6.0–8.5)

## 2020-05-29 LAB — MAGNESIUM: Magnesium: 1.6 mg/dL (ref 1.6–2.3)

## 2020-05-29 NOTE — Telephone Encounter (Signed)
Dr Ellyn Hack  Reviewed lab results - K+ 2.8.and last office visit.  per Dr Ellyn Hack  -- increase oral water intake. -- hold  Lasix  For 1 day   -- then  Cut dose of lasix to 1/2  For 2 days --  Increase Potassium to 4 tablets for 2 days  Recheck labs-BMP on Monday 06/03/20- order laced  do not use the metolazone until  Result of upcoming labs are reviewed     Patient and husband are aware and verbalized understanding.

## 2020-05-29 NOTE — Telephone Encounter (Signed)
[facility] is calling with critical K level of 2.8 for Stephanie Franco.  Please follow as needed.

## 2020-05-29 NOTE — Telephone Encounter (Signed)
Lab printed and taken to dr harding

## 2020-06-06 LAB — BASIC METABOLIC PANEL
BUN/Creatinine Ratio: 23 (ref 12–28)
BUN: 34 mg/dL — ABNORMAL HIGH (ref 8–27)
CO2: 28 mmol/L (ref 20–29)
Calcium: 10.1 mg/dL (ref 8.7–10.3)
Chloride: 102 mmol/L (ref 96–106)
Creatinine, Ser: 1.51 mg/dL — ABNORMAL HIGH (ref 0.57–1.00)
GFR calc Af Amer: 40 mL/min/{1.73_m2} — ABNORMAL LOW (ref >59)
GFR calc non Af Amer: 35 mL/min/{1.73_m2} — ABNORMAL LOW (ref >59)
Glucose: 121 mg/dL — ABNORMAL HIGH (ref 65–99)
Potassium: 3.5 mmol/L (ref 3.5–5.2)
Sodium: 148 mmol/L — ABNORMAL HIGH (ref 134–144)

## 2020-06-11 ENCOUNTER — Ambulatory Visit: Payer: Medicare Other | Admitting: Rheumatology

## 2020-06-11 DIAGNOSIS — E1169 Type 2 diabetes mellitus with other specified complication: Secondary | ICD-10-CM

## 2020-06-11 DIAGNOSIS — N183 Chronic kidney disease, stage 3 unspecified: Secondary | ICD-10-CM

## 2020-06-11 DIAGNOSIS — M1612 Unilateral primary osteoarthritis, left hip: Secondary | ICD-10-CM

## 2020-06-11 DIAGNOSIS — I2510 Atherosclerotic heart disease of native coronary artery without angina pectoris: Secondary | ICD-10-CM

## 2020-06-11 DIAGNOSIS — M19072 Primary osteoarthritis, left ankle and foot: Secondary | ICD-10-CM

## 2020-06-11 DIAGNOSIS — Z8719 Personal history of other diseases of the digestive system: Secondary | ICD-10-CM

## 2020-06-11 DIAGNOSIS — M5030 Other cervical disc degeneration, unspecified cervical region: Secondary | ICD-10-CM

## 2020-06-11 DIAGNOSIS — M19041 Primary osteoarthritis, right hand: Secondary | ICD-10-CM

## 2020-06-11 DIAGNOSIS — I420 Dilated cardiomyopathy: Secondary | ICD-10-CM

## 2020-06-11 DIAGNOSIS — Z7189 Other specified counseling: Secondary | ICD-10-CM

## 2020-06-11 DIAGNOSIS — M109 Gout, unspecified: Secondary | ICD-10-CM

## 2020-06-11 DIAGNOSIS — Z96641 Presence of right artificial hip joint: Secondary | ICD-10-CM

## 2020-06-11 DIAGNOSIS — G629 Polyneuropathy, unspecified: Secondary | ICD-10-CM

## 2020-06-11 DIAGNOSIS — I4821 Permanent atrial fibrillation: Secondary | ICD-10-CM

## 2020-06-11 DIAGNOSIS — I340 Nonrheumatic mitral (valve) insufficiency: Secondary | ICD-10-CM

## 2020-06-11 DIAGNOSIS — G4733 Obstructive sleep apnea (adult) (pediatric): Secondary | ICD-10-CM

## 2020-06-11 DIAGNOSIS — I272 Pulmonary hypertension, unspecified: Secondary | ICD-10-CM

## 2020-06-11 DIAGNOSIS — I5043 Acute on chronic combined systolic (congestive) and diastolic (congestive) heart failure: Secondary | ICD-10-CM

## 2020-06-11 DIAGNOSIS — I10 Essential (primary) hypertension: Secondary | ICD-10-CM

## 2020-06-28 ENCOUNTER — Other Ambulatory Visit: Payer: Self-pay | Admitting: Cardiology

## 2020-08-09 ENCOUNTER — Other Ambulatory Visit: Payer: Self-pay | Admitting: Cardiology

## 2020-08-13 ENCOUNTER — Other Ambulatory Visit: Payer: Self-pay | Admitting: Cardiology

## 2020-09-12 ENCOUNTER — Other Ambulatory Visit: Payer: Self-pay | Admitting: Cardiology

## 2020-10-03 ENCOUNTER — Telehealth: Payer: Self-pay | Admitting: Cardiology

## 2020-10-03 ENCOUNTER — Other Ambulatory Visit: Payer: Self-pay

## 2020-10-03 MED ORDER — POTASSIUM CHLORIDE CRYS ER 20 MEQ PO TBCR: EXTENDED_RELEASE_TABLET | ORAL | 1 refills | Status: DC

## 2020-10-03 MED ORDER — FUROSEMIDE 80 MG PO TABS: ORAL_TABLET | ORAL | 1 refills | Status: DC

## 2020-10-03 NOTE — Telephone Encounter (Signed)
Called patient husband. He states that the potassium tablet is when she takes lasix, but instructions states to take two extra if needed, they do this at times and run out faster- he requested a refill. I did advise that she was overdue for follow up with Dr.Harding. appointment was made for December when it opens up, I did advise I would notify his primary RN of this and they will call and get in with a PA if they have any concerns.   Thanks!

## 2020-10-03 NOTE — Telephone Encounter (Signed)
Patient's husband called in regards to patient medication furosemide (LASIX) 80 MG tablet and potassium chloride SA (KLOR-CON M20) 20 MEQ tablet. Patient's husband has some questions/concern bout the meds. Please advise

## 2020-10-14 ENCOUNTER — Other Ambulatory Visit: Payer: Self-pay | Admitting: Cardiology

## 2020-11-03 ENCOUNTER — Other Ambulatory Visit: Payer: Self-pay | Admitting: Cardiology

## 2020-11-28 ENCOUNTER — Other Ambulatory Visit: Payer: Self-pay | Admitting: Cardiology

## 2021-01-01 ENCOUNTER — Other Ambulatory Visit: Payer: Self-pay | Admitting: Cardiology

## 2021-01-01 NOTE — Telephone Encounter (Signed)
Rx(s) sent to pharmacy electronically.  

## 2021-01-10 ENCOUNTER — Other Ambulatory Visit: Payer: Self-pay | Admitting: Cardiology

## 2021-03-05 ENCOUNTER — Other Ambulatory Visit: Payer: Self-pay | Admitting: Cardiology

## 2021-03-09 ENCOUNTER — Other Ambulatory Visit: Payer: Self-pay | Admitting: Cardiology

## 2021-03-25 ENCOUNTER — Other Ambulatory Visit: Payer: Self-pay | Admitting: Cardiology

## 2021-04-02 ENCOUNTER — Other Ambulatory Visit: Payer: Self-pay | Admitting: Cardiology

## 2021-04-14 ENCOUNTER — Other Ambulatory Visit: Payer: Self-pay

## 2021-04-14 ENCOUNTER — Ambulatory Visit (INDEPENDENT_AMBULATORY_CARE_PROVIDER_SITE_OTHER): Payer: Medicare Other | Admitting: Cardiology

## 2021-04-14 ENCOUNTER — Encounter: Payer: Self-pay | Admitting: Cardiology

## 2021-04-14 VITALS — BP 138/80 | HR 88 | Ht 63.0 in | Wt 203.8 lb

## 2021-04-14 DIAGNOSIS — I10 Essential (primary) hypertension: Secondary | ICD-10-CM | POA: Diagnosis not present

## 2021-04-14 DIAGNOSIS — I2510 Atherosclerotic heart disease of native coronary artery without angina pectoris: Secondary | ICD-10-CM | POA: Diagnosis not present

## 2021-04-14 DIAGNOSIS — I420 Dilated cardiomyopathy: Secondary | ICD-10-CM

## 2021-04-14 DIAGNOSIS — G4733 Obstructive sleep apnea (adult) (pediatric): Secondary | ICD-10-CM

## 2021-04-14 DIAGNOSIS — I5032 Chronic diastolic (congestive) heart failure: Secondary | ICD-10-CM

## 2021-04-14 DIAGNOSIS — Z9861 Coronary angioplasty status: Secondary | ICD-10-CM

## 2021-04-14 DIAGNOSIS — E1169 Type 2 diabetes mellitus with other specified complication: Secondary | ICD-10-CM | POA: Diagnosis not present

## 2021-04-14 DIAGNOSIS — R400 Somnolence: Secondary | ICD-10-CM

## 2021-04-14 DIAGNOSIS — E785 Hyperlipidemia, unspecified: Secondary | ICD-10-CM

## 2021-04-14 DIAGNOSIS — I4821 Permanent atrial fibrillation: Secondary | ICD-10-CM | POA: Diagnosis not present

## 2021-04-14 DIAGNOSIS — I272 Pulmonary hypertension, unspecified: Secondary | ICD-10-CM

## 2021-04-14 NOTE — Progress Notes (Signed)
Primary Care Provider: Lin Landsman, MD Cardiologist: Glenetta Hew, MD Electrophysiologist: None  Clinic Note: Chief Complaint  Patient presents with   Follow-up    Overall stable,    Congestive Heart Failure    had to take metolazone last week, but otherwise feeling stable.   Coronary Artery Disease    No angina   Daytime sleepiness    Requesting sleep study   ===================================  ASSESSMENT/PLAN   Problem List Items Addressed This Visit       Cardiology Problems   CAD S/P percutaneous coronary angioplasty (Chronic)    Patient history of PCI in 2006 with patent stent 2016.  Last Myoview was in June 2021, nonischemic.  Plan:  Continue current dose of statin and Zetia along with beta-blocker. ->  Low threshold to switch from atorvastatin to rosuvastatin if lipids still not at goal. Not on aspirin or Plavix because of Eliquis.      Permanent atrial fibrillation: CHA2DS2-VASc Score 6 (Chronic)    Remains in A. fib.  No real sense of whether she is or is not in A. fib.  Is rate controlled with Bystolic.  On Eliquis with no bleeding.  No change.      Relevant Orders   EKG 12-Lead (Completed)   Lipid panel (Completed)   Comprehensive metabolic panel (Completed)   Hemoglobin A1c (Completed)   CBC (Completed)   Chronic heart failure with preserved ejection fraction (HFpEF) ([place]) - Primary (Chronic)    Now EF back up to baseline 50 to 55%.  HFpEF.  Not on ARB because of renal insufficiency, this would be an option if renal function stays stable, but for now would probably consider adding spironolactone pending reevaluation of labs in addition to the medical well.  On stable dose of diuretic there is stable.  No changes to that regimen.      Dilated cardiomyopathy (HCC) (Chronic)    Overall EF improved on most recent echocardiogram.  No longer appears to be volume overloaded.  Mostly still not diastolic dysfunction and pulmonary pretension related heart  failure symptoms.  Less PND or orthopnea, or edema.  On high-dose of diuretic.  Discontinue with current plan for twice daily furosemide and occasional metolazone.  Currently not on spironolactone, but will consider starting if potassium level is still low since she is still requiring metolazone occasionally.      Relevant Orders   Lipid panel (Completed)   Comprehensive metabolic panel (Completed)   Hemoglobin A1c (Completed)   CBC (Completed)   Essential hypertension (Chronic)    BP slightly elevated today, but tends to be better at home.  Was 120/70 this morning at home.  Continue to monitor.  She is on high-dose of furosemide along with Imdur and Bystolic.  Has had low potassium in the past, the plan was to consider ACE inhibitor or ARB reinitiation pending repeat labs.  Will probably start with spironolactone first if potassium level still low.      Relevant Orders   Lipid panel (Completed)   Comprehensive metabolic panel (Completed)   Hemoglobin A1c (Completed)   CBC (Completed)   Hyperlipidemia associated with type 2 diabetes mellitus ([place]) (Chronic)    Lipids were relatively well-controlled at 2020-2021 with LDL 65.  Most recently 19.  Due for labs recheck now.  She is on Zetia and atorvastatin.  If still not able to get below 70, will probably switch to rosuvastatin.  Plan: Check lipid panel and chemistry panel.  We will also check A1c She  is on insulin, potentially recommend [place] or Jardiance to help with diuresis and glycemic control.      Relevant Orders   Lipid panel (Completed)   Comprehensive metabolic panel (Completed)   Hemoglobin A1c (Completed)   CBC (Completed)   Pulmonary hypertension (HCC) (Chronic)    Previously noted to have pulmonary pretension.  Now with concern for abnormal sleep with snoring possible sleep apnea.  Plan: Sleep study evaluation.        Other   Morbid obesity ([place]) (Chronic)    Weight is better, maintaining stable weight in  the low 200s.  Would like for her to lose some weight, but for now stable.      Obstructive sleep apnea syndrome (Chronic)    Had previously been on CPAP, now not as frequent use.  Needs to have reevaluation to reestablish CPAP.  Plan: Split-night study.      Relevant Orders   Split night study   Other Visit Diagnoses     Daytime sleepiness       Relevant Orders   Split night study       ===================================  HPI:    CRYSTEN KAMAN is a 70 y.o. female with a PMH notable for permanent A. fib, chronic HFpEF, CAD-PCI, HTN, HLD-DM-2 who presents today for annual f/u.  WESTLYN GLAZA was last seen on April 04, 2020 via telemedicine/telephone visit.  She was pretty well at that time.  Swelling was doing well.  She was taking metolazone plus double furosemide 2-3 times a week.  Otherwise taking 80 mg twice daily furosemide with occasional 2 tabs in the morning.  Was taking 2 of the [place]-Con tablets twice daily when taking metolazone and once twice a day when not taking it.  Working on cutting down salty foods.  No further chest pain after using antiacid medicine.  Unable to sense A. fib.  No PND orthopnea.  No exertional dyspnea.  Weight got up to 210 pounds but for the most part is averaging 200 to 206 pounds. -> Procrastinating on initiating exercise levels.  She does enjoy going out shopping and walking.  Recent Hospitalizations: None  She apparently did have COVID shortly after that visit in November  Reviewed  CV studies:    The following studies were reviewed today: (if available, images/films reviewed: From Epic Chart or Care Everywhere) None:  Interval History:   MARLINDA MIRANDA returns today along with her husband for her annual follow-up visit.  She says that her swelling is pretty much okay.  Her weights have been stable.  She just needs her meds refilled to make sure they do not run out.  She says that just for the last couple days she has had some  orthopnea and worsening edema and had increased her diuretic dosing.  But this improved after taking her metolazone.  She been try to cut back on the metolazone.  No real PND orthopnea symptoms.  She says that occasionally she feels her heart going fast with some palpitations but nothing prolonged.  Usually has happens if she is upset or try to exercise.  Otherwise she does not really notice any A. fib symptoms.  No chest pain or pressure with rest or exertion.  No syncope or near syncope symptoms.  No TIA or amaurosis fugax.  No claudication.  He also notes daytime sleepiness and fatigue during the day.  Has been noticed very significant snoring.  Requesting sleep study evaluation.  REVIEWED OF SYSTEMS   Review  of Systems  Constitutional:  Negative for malaise/fatigue (Deconditioned.  Not exercising).  Cardiovascular:  Negative for claudication.  Gastrointestinal:  Positive for constipation (On and off).  Genitourinary:  Positive for frequency (Associate with diuretics.).  Musculoskeletal:  Positive for joint pain (None recently, but occasionally has gouty flares.).  Neurological:  Positive for dizziness (With changes of position,Also turning her head from side to side.). Negative for focal weakness and weakness.  Psychiatric/Behavioral:  Negative for depression (Seems to be pretty stable at this point.) and memory loss. The patient is not nervous/anxious and does not have insomnia.    I have reviewed and (if needed) personally updated the patient's problem list, medications, allergies, past medical and surgical history, social and family history.   PAST MEDICAL HISTORY   Past Medical History:  Diagnosis Date   Aortic atherosclerosis ([place])    Arthritis    Asthma    Atrial fibrillation, permanent ([place])    Rate control with Bystolic. CHA2DS2Vasc = 6 (HTN, DM, CHF, Age 89, Female) -> on Pradaxa   Breast cancer ([place]) 2008   S/P mastectomy   CAD S/P percutaneous coronary angioplasty 2006;     PCI of circumflex with Taxus DES;; relook-cath Feb 2013: 50-60% short lesion in RCA, 40% ISR Circumflex stent.   CKD (chronic kidney disease), stage III (HCC)    Complication of anesthesia    prolonged sedation after colonoscopy in [place]   Diabetes mellitus, uncontrolled 07/14/2011   Dilated cardiomyopathy (HCC)    Mostly Resolved (EF up from ~25% to ~45% by Echo)   DOE (dyspnea on exertion)    Edema of both legs    Usually mild, chronic. Controlled with when necessary furosemide and diet   Fibroid, uterine 07/13/11   "have that now"   GERD (gastroesophageal reflux disease)    Gout    Hyperlipidemia    Hypertension    Hypokalemia    Morbid obesity ([place])    BMI 41   OSA on CPAP    On CPAP   Pulmonary hypertension, unspecified (HCC)    PAP ~90 mmHg on Echo 12/2017 -- has OSA on CPAP & obesity - but Overall Improved Sx of dyspnea / edema   Systolic murmur     PAST SURGICAL HISTORY   Past Surgical History:  Procedure Laterality Date   COLONOSCOPY     CORONARY ANGIOPLASTY WITH STENT PLACEMENT  2006   stent to circumflex   DILATION AND CURETTAGE OF UTERUS     LEFT HEART CATHETERIZATION WITH CORONARY ANGIOGRAM N/A 07/13/2011   Procedure: LEFT HEART CATHETERIZATION WITH CORONARY ANGIOGRAM;  Surgeon: Leonie Man, MD;  Location: [facility] CATH LAB;  Service: Cardiovascular;  normal EF; 64m 50-60% lesion in RCA; patent stent in circumflex form 2006 with ~40% in-stent re-stenosis   MASTECTOMY  2008   left   NM MYOVIEW LTD  7/'16; 6/'21   LOW RISK. No ischemia or infarction. Not gated 2/2  A. fib - unable to assess EF   TOTAL HIP ARTHROPLASTY Right 01/19/2018   Procedure: RIGHT TOTAL HIP ARTHROPLASTY ANTERIOR APPROACH;  Surgeon: SRod Can MD;  Location: [facility] ORS;  Service: Orthopedics;  Laterality: Right;  Needs RNFA   TRANSTHORACIC ECHOCARDIOGRAM  6/'15; 9/'16   a) In setting of Urosepsis: EF ~25 %, global HK (poor quality study);; b) LV EF 40-45%, Mild Anteroseptal HK. Mod-Severe  RA  dilation with elevated PA Pressures (~~peak 63 mmHg).. Mild LA dilation   TRANSTHORACIC ECHOCARDIOGRAM  12/2017; 04/15/2018   a) Improved LVEF  to 45 to 50%.  Pulmonary HTN ~PAP ~ 60 mmHg. Severe LA dilation& mild /rv dilation.; b) Mod LVH. EF 40-45% (back to prior EF). Unable to assess DF (Afib). Severe R&L Atrial enlargement.  Moderate Post-Lateral directed MR with Mod RV dilation.  PAP ~66 mmHg.;    TRANSTHORACIC ECHOCARDIOGRAM  01/2019   relook Echo: EF back up to 50 to 55%.  Indeterminate diastolic parameters.  Moderately dilated left atrium and right atrium.  Moderate MR.  Mild to moderate TR.    Immunization History  Administered Date(s) Administered   Influenza, High Dose Seasonal PF 03/26/2016, 03/19/2017   Influenza,inj,quad, With Preservative 03/18/2017   Pneumococcal Polysaccharide-23 07/13/2011    MEDICATIONS/ALLERGIES   Current Meds  Medication Sig   albuterol (PROVENTIL HFA;VENTOLIN HFA) 108 (90 Base) MCG/ACT inhaler Inhale 1-2 puffs into the lungs every 6 (six) hours as needed for wheezing or shortness of breath.   albuterol (PROVENTIL) (2.5 MG/3ML) 0.083% nebulizer solution Take 3 mLs (2.5 mg total) by nebulization every 6 (six) hours as needed for wheezing or shortness of breath.   allopurinol (ZYLOPRIM) 300 MG tablet Take 300 mg by mouth daily.   blood glucose meter kit and supplies KIT Dispense based on patient and insurance preference. Use up to four times daily as directed. (FOR ICD-9 250.00, 250.01).   Colchicine 0.6 MG CAPS Take 1 tablet by mouth daily.    ELIQUIS 5 MG TABS tablet TAKE 1 TABLET BY MOUTH TWICE A DAY   ezetimibe (ZETIA) 10 MG tablet TAKE 1 TABLET BY MOUTH EVERY DAY   ferrous sulfate 325 (65 FE) MG EC tablet Take 325 mg by mouth daily with breakfast.   fexofenadine (ALLEGRA) 180 MG tablet Take 180 mg by mouth daily as needed for allergies.    fluticasone (FLONASE) 50 MCG/ACT nasal spray Place 2 sprays into the nose daily as needed for allergies or  rhinitis.    furosemide (LASIX) 80 MG tablet TAKE 1 TABLET BY MOUTH TWICE A DAY   gabapentin (NEURONTIN) 600 MG tablet Take 600 mg by mouth daily. Take 600 mg by mouth daily   glucose blood test strip 1 each by Other route as needed for other. Use as instructed   Insulin Glargine 300 UNIT/ML SOPN Inject 40 Units into the skin 2 (two) times daily before a meal.    isosorbide mononitrate (IMDUR) 60 MG 24 hr tablet TAKE 1 TABLET BY MOUTH EVERY DAY   LORazepam (ATIVAN) 1 MG tablet Take 1 mg by mouth See admin instructions. Take 1 mg by mouth two times a day- morning and evening   metolazone (ZAROXOLYN) 5 MG tablet TAKE 1 TABLET 30 MINUTES BEFORE FUROSEMIDE DAILY AS NEEDED   Multiple Vitamins-Minerals (MULTIVITAMIN WITH MINERALS) tablet Take 1 tablet by mouth daily.   nebivolol (BYSTOLIC) 10 MG tablet TAKE 1 TABLET BY MOUTH EVERY DAY   nitroGLYCERIN (NITROSTAT) 0.4 MG SL tablet DISSOLVE 1 TABLET UNDER THE TONGUE EVERY 5 MINUTES AS NEEDED FOR CHEST PAIN.   pantoprazole (PROTONIX) 40 MG tablet TAKE 1 TABLET BY MOUTH EVERY DAY   triamcinolone ointment (KENALOG) 0.5 % Apply to legs 2 times daily for 2 weeks (Patient taking differently: Apply 1 application topically See admin instructions. Apply to legs 2 times daily for 2 weeks)   [atorvastatin (LIPITOR) 40 MG tablet TAKE 1 TABLET BY MOUTH EVERY DAY   []  potassium chloride SA (KLOR-CON M20) 20 MEQ tablet TAKE 1 TABLET BY MOUTH TWICE A DAY MAY TAKE ADDITIONAL 2TABS AS NEEDED/DIRECTED  No Known Allergies  SOCIAL HISTORY/FAMILY HISTORY   Reviewed in Epic:  Pertinent findings:  Social History   Tobacco Use   Smoking status: Former    Packs/day: 0.50    Years: 6.00    Pack years: 3.00    Types: Cigarettes    Quit date: 05/18/1992    Years since quitting: 28.9   Smokeless tobacco: Never  Vaping Use   Vaping Use: Never used  Substance Use Topics   Alcohol use: No    Comment: 07/13/11 "have drank occasionally; not now"   Drug use: No   Social  History   Social History Narrative   She is a married mother of 16, grandmother 2. Usually accompanied by her husband. She does not work. She had been working on her exercise, but is no longer as active. Does not drink and does not smoke    OBJCTIVE -PE, EKG, labs   Wt Readings from Last 3 Encounters:  04/14/21 203 lb 12.8 oz (92.4 kg)  04/04/20 204 lb 9.6 oz (92.8 kg)  11/29/19 218 lb 3.2 oz (99 kg)    Physical Exam: BP 138/80   Pulse 88   Ht 5' 3"  (1.6 m)   Wt 203 lb 12.8 oz (92.4 kg)   SpO2 97%   BMI 36.10 kg/m  Physical Exam Constitutional:      General: She is not in acute distress.    Appearance: Normal appearance. She is obese. She is not toxic-appearing.     Comments: Well-nourished, well-groomed.  HENT:     Head: Normocephalic and atraumatic.  Neck:     Vascular: No carotid bruit, hepatojugular reflux or JVD.  Cardiovascular:     Rate and Rhythm: Normal rate. Rhythm irregularly irregular.     Chest Wall: PMI is not displaced.     Pulses: Intact distal pulses.     Heart sounds: S1 normal and S2 normal. Murmur heard.  High-pitched blowing holosystolic murmur is present with a grade of 1/6 at the apex radiating to the back.    No friction rub. No gallop.  Musculoskeletal:     Cervical back: Normal range of motion and neck supple.  Neurological:     Mental Status: She is alert.     Adult ECG Report  Rate: 88 ;  Rhythm: atrial fibrillation, premature ventricular contractions (PVC), and LA enlargement.  LVH with repolarization changes.  Cannot exclude lateral ischemia. ;   Narrative Interpretation: stable  Recent Labs:  reviewed - ordered.  Last labs were from January 2022: Cr 1.51.  Sodium 148. Last lipid panel:  LDL 73, TC 133  ==================================================  COVID-19 Education: The signs and symptoms of COVID-19 were discussed with the patient and how to seek care for testing (follow up with PCP or arrange E-visit).    I spent a total  of 27 minutes with the patient spent in direct patient consultation.  Additional time spent with chart review  / charting (studies, outside notes, etc): 16 min Total Time: 43 min  Current medicines are reviewed at length with the patient today.  (+/- concerns) n/a  This visit occurred during the SARS-CoV-2 public health emergency.  Safety protocols were in place, including screening questions prior to the visit, additional usage of staff PPE, and extensive cleaning of exam room while observing appropriate contact time as indicated for disinfecting solutions.  Notice: This dictation was prepared with Dragon dictation along with smart phrase technology. Any transcriptional errors that result from this process are unintentional and  may not be corrected upon review.  Studies Ordered:   Orders Placed This Encounter  Procedures   Lipid panel   Comprehensive metabolic panel   Hemoglobin A1c   CBC   EKG 12-Lead   Split night study    Patient Instructions / Medication Changes & Studies & Tests Ordered   Patient Instructions  Medication Instructions:   NO CHANGES *If you need a refill on your cardiac medications before your next appointment, please call your pharmacy*   Lab Work: CBC CMP LIPID HGBA1C If you have labs (blood work) drawn today and your tests are completely normal, you will receive your results only by: MyChart Message (if you have MyChart) OR A paper copy in the mail If you have any lab test that is abnormal or we need to change your treatment, we will call you to review the results.   Testing/Procedures: You will be contacted once insurance authorization is obtained Your physician has recommended that you have a sleep study. This test records several body functions during sleep, including: brain activity, eye movement, oxygen and carbon dioxide blood levels, heart rate and rhythm, breathing rate and rhythm, the flow of air through your mouth and nose, snoring, body  muscle movements, and chest and belly movement.    Follow-Up: At [facility], you and your health needs are our priority.  As part of our continuing mission to provide you with exceptional heart care, we have created designated Provider Care Teams.  These Care Teams include your primary Cardiologist (physician) and Advanced Practice Providers (APPs -  Physician Assistants and Nurse Practitioners) who all work together to provide you with the care you need, when you need it.     Your next appointment:   6 month(s)  The format for your next appointment:   In Person  Provider:   Glenetta Hew, MD      Glenetta Hew, M.D., M.S. Interventional Cardiologist   Pager # [phone] Phone # [phone] [address] [place], [place] [address]   Thank you for choosing Heartcare at [facility]!!

## 2021-04-14 NOTE — Patient Instructions (Addendum)
Medication Instructions:   NO CHANGES *If you need a refill on your cardiac medications before your next appointment, please call your pharmacy*   Lab Work: CBC CMP LIPID HGBA1C If you have labs (blood work) drawn today and your tests are completely normal, you will receive your results only by: [place] (if you have MyChart) OR A paper copy in the mail If you have any lab test that is abnormal or we need to change your treatment, we will call you to review the results.   Testing/Procedures: You will be contacted once insurance authorization is obtained Your physician has recommended that you have a sleep study. This test records several body functions during sleep, including: brain activity, eye movement, oxygen and carbon dioxide blood levels, heart rate and rhythm, breathing rate and rhythm, the flow of air through your mouth and nose, snoring, body muscle movements, and chest and belly movement.    Follow-Up: At [facility], you and your health needs are our priority.  As part of our continuing mission to provide you with exceptional heart care, we have created designated Provider Care Teams.  These Care Teams include your primary Cardiologist (physician) and Advanced Practice Providers (APPs -  Physician Assistants and Nurse Practitioners) who all work together to provide you with the care you need, when you need it.     Your next appointment:   6 month(s)  The format for your next appointment:   In Person  Provider:   Glenetta Hew, MD

## 2021-04-15 ENCOUNTER — Telehealth: Payer: Self-pay | Admitting: *Deleted

## 2021-04-15 NOTE — Telephone Encounter (Signed)
Patient notified of sleep study appointment details.

## 2021-04-15 NOTE — Telephone Encounter (Signed)
-----   Message from Raiford Simmonds, RN sent at 04/14/2021 10:08 AM EST ----- Regarding: schedule a split  study  Patient was seen on 04/14/21 by Dr Ellyn Hack  Ordered split night.  Patient had used a C-PAP in the past  Thanks  [employer] forces training and education officer

## 2021-04-16 ENCOUNTER — Other Ambulatory Visit: Payer: Self-pay | Admitting: Cardiology

## 2021-04-21 ENCOUNTER — Ambulatory Visit: Payer: Medicare Other | Admitting: Cardiology

## 2021-04-22 ENCOUNTER — Other Ambulatory Visit: Payer: Self-pay | Admitting: Cardiology

## 2021-04-24 LAB — COMPREHENSIVE METABOLIC PANEL
ALT: 45 IU/L — ABNORMAL HIGH (ref 0–32)
AST: 35 IU/L (ref 0–40)
Albumin/Globulin Ratio: 1.8 (ref 1.2–2.2)
Albumin: 4.4 g/dL (ref 3.7–4.7)
Alkaline Phosphatase: 84 IU/L (ref 44–121)
BUN/Creatinine Ratio: 32 — ABNORMAL HIGH (ref 12–28)
BUN: 52 mg/dL — ABNORMAL HIGH (ref 8–27)
Bilirubin Total: 0.7 mg/dL (ref 0.0–1.2)
CO2: 30 mmol/L — ABNORMAL HIGH (ref 20–29)
Calcium: 10.4 mg/dL — ABNORMAL HIGH (ref 8.7–10.3)
Chloride: 97 mmol/L (ref 96–106)
Creatinine, Ser: 1.65 mg/dL — ABNORMAL HIGH (ref 0.57–1.00)
Globulin, Total: 2.4 g/dL (ref 1.5–4.5)
Glucose: 106 mg/dL — ABNORMAL HIGH (ref 70–99)
Potassium: 3 mmol/L — ABNORMAL LOW (ref 3.5–5.2)
Sodium: 143 mmol/L (ref 134–144)
Total Protein: 6.8 g/dL (ref 6.0–8.5)
eGFR: 33 mL/min/{1.73_m2} — ABNORMAL LOW (ref >59)

## 2021-04-24 LAB — CBC
Hematocrit: 40.1 % (ref 34.0–46.6)
Hemoglobin: 13.1 g/dL (ref 11.1–15.9)
MCH: 29.6 pg (ref 26.6–33.0)
MCHC: 32.7 g/dL (ref 31.5–35.7)
MCV: 91 fL (ref 79–97)
Platelets: 203 10*3/uL (ref 150–450)
RBC: 4.42 x10E6/uL (ref 3.77–5.28)
RDW: 15.8 % — ABNORMAL HIGH (ref 11.7–15.4)
WBC: 8.6 10*3/uL (ref 3.4–10.8)

## 2021-04-24 LAB — HEMOGLOBIN A1C
Est. average glucose Bld gHb Est-mCnc: 154 mg/dL
Hgb A1c MFr Bld: 7 % — ABNORMAL HIGH (ref 4.8–5.6)

## 2021-04-24 LAB — LIPID PANEL
Chol/HDL Ratio: 3.2 ratio (ref 0.0–4.4)
Cholesterol, Total: 133 mg/dL (ref 100–199)
HDL: 41 mg/dL (ref >39)
LDL Chol Calc (NIH): 73 mg/dL (ref 0–99)
Triglycerides: 101 mg/dL (ref 0–149)
VLDL Cholesterol Cal: 19 mg/dL (ref 5–40)

## 2021-05-07 ENCOUNTER — Encounter: Payer: Self-pay | Admitting: Cardiology

## 2021-05-07 NOTE — Assessment & Plan Note (Signed)
Previously noted to have pulmonary pretension.  Now with concern for abnormal sleep with snoring possible sleep apnea.  Plan: Sleep study evaluation.

## 2021-05-07 NOTE — Assessment & Plan Note (Signed)
Patient history of PCI in 2006 with patent stent 2016.  Last Myoview was in June 2021, nonischemic.  Plan:   Continue current dose of statin and Zetia along with beta-blocker. ->  Low threshold to switch from atorvastatin to rosuvastatin if lipids still not at goal.  Not on aspirin or Plavix because of Eliquis.

## 2021-05-07 NOTE — Assessment & Plan Note (Signed)
BP slightly elevated today, but tends to be better at home.  Was 120/70 this morning at home.  Continue to monitor.  She is on high-dose of furosemide along with Imdur and Bystolic.  Has had low potassium in the past, the plan was to consider ACE inhibitor or ARB reinitiation pending repeat labs.  Will probably start with spironolactone first if potassium level still low.

## 2021-05-07 NOTE — Assessment & Plan Note (Signed)
Weight is better, maintaining stable weight in the low 200s.  Would like for her to lose some weight, but for now stable.

## 2021-05-07 NOTE — Assessment & Plan Note (Signed)
Overall EF improved on most recent echocardiogram.  No longer appears to be volume overloaded.  Mostly still not diastolic dysfunction and pulmonary pretension related heart failure symptoms.  Less PND or orthopnea, or edema.  On high-dose of diuretic.  Discontinue with current plan for twice daily furosemide and occasional metolazone.  Currently not on spironolactone, but will consider starting if potassium level is still low since she is still requiring metolazone occasionally.

## 2021-05-07 NOTE — Assessment & Plan Note (Addendum)
Lipids were relatively well-controlled at 2020-2021 with LDL 65.  Most recently 27.  Due for labs recheck now.  She is on Zetia and atorvastatin.  If still not able to get below 70, will probably switch to rosuvastatin.  Plan: Check lipid panel and chemistry panel.  We will also check A1c She is on insulin, potentially recommend Farxiga or Jardiance to help with diuresis and glycemic control.

## 2021-05-07 NOTE — Assessment & Plan Note (Addendum)
Now EF back up to baseline 50 to 55%.  HFpEF.  Not on ARB because of renal insufficiency, this would be an option if renal function stays stable, but for now would probably consider adding spironolactone pending reevaluation of labs in addition to the medical well.  On stable dose of diuretic there is stable.  No changes to that regimen.

## 2021-05-07 NOTE — Assessment & Plan Note (Signed)
Had previously been on CPAP, now not as frequent use.  Needs to have reevaluation to reestablish CPAP.  Plan: Split-night study.

## 2021-05-07 NOTE — Assessment & Plan Note (Signed)
Remains in A. fib.  No real sense of whether she is or is not in A. fib.  Is rate controlled with Bystolic.  On Eliquis with no bleeding.  No change.

## 2021-05-14 ENCOUNTER — Telehealth: Payer: Self-pay | Admitting: *Deleted

## 2021-05-14 DIAGNOSIS — Z79899 Other long term (current) drug therapy: Secondary | ICD-10-CM

## 2021-05-14 DIAGNOSIS — I272 Pulmonary hypertension, unspecified: Secondary | ICD-10-CM

## 2021-05-14 DIAGNOSIS — I5032 Chronic diastolic (congestive) heart failure: Secondary | ICD-10-CM

## 2021-05-14 DIAGNOSIS — E876 Hypokalemia: Secondary | ICD-10-CM

## 2021-05-14 MED ORDER — ROSUVASTATIN CALCIUM 40 MG PO TABS: 40.0000 mg | ORAL_TABLET | Freq: Every day | ORAL | 3 refills | Status: DC

## 2021-05-14 MED ORDER — SPIRONOLACTONE 25 MG PO TABS: 25.0000 mg | ORAL_TABLET | Freq: Every day | ORAL | 6 refills | Status: DC

## 2021-05-14 NOTE — Telephone Encounter (Signed)
Spoke to patient 's husband.  Patient and husband reviewed direction via mychart.   RN verbal gave  direction to  husband.   Informed Mr Windholz - statin medication changes and additional medication added Spironolactone ,Lab order -CMP. Mr Pomerleau verbalized understanding.

## 2021-05-14 NOTE — Telephone Encounter (Signed)
Overall, cholesterol level looks okay.  Not quite at the goal would be like to be. -> would like to convert from Atorvastatin 40 mg to Rosuvastatin 40 mg po daily (once current Rx of Atorvastatin complete - I.e. next refill - change to Rosuvastatin).   Chem panel: Overall, kidney function seems relatively stable.  However the potassium level still remains on the low side.  For the next week, take an additional dose of potassium supplement daily.  I would like to add spironolactone 25 mg p.o. daily (disp 90 tab, 3 refill) back to her regimen.  (This may keep her from having to use metolazone for edema).   Recheck CMP 1-2 weeks after starting spironolactone.        Glenetta Hew, MD

## 2021-05-27 ENCOUNTER — Telehealth: Payer: Self-pay | Admitting: Cardiology

## 2021-05-27 MED ORDER — METOLAZONE 5 MG PO TABS: ORAL_TABLET | ORAL | 0 refills | Status: DC

## 2021-05-27 NOTE — Telephone Encounter (Signed)
Spoke with husband (DPR), he states that he/pt is unsure when she should be taking the metolazone. He states that she typically takes twice weekly, this is the as needed for pt. All questions answered. Pt needs refill. Pt has upcoming f/u appt scheduled. Refill sent

## 2021-05-27 NOTE — Telephone Encounter (Signed)
Pt c/o medication issue:  1. Name of Medication: spironolactone (ALDACTONE) 25 MG tablet  metolazone (ZAROXOLYN) 5 MG tablet  2. How are you currently taking this medication (dosage and times per day)? Take 1 tablet (25 mg total) by mouth daily  TAKE 1 TABLET 30 MINUTES BEFORE FUROSEMIDE DAILY AS NEEDED  3. Are you having a reaction (difficulty breathing--STAT)? no  4. What is your medication issue? Husband calling in to if can be added back metolazone (ZAROXOLYN) 5 MG tablet to have the patient take it. They state that they were told to stop taking that medication. If not cant patient stop taking spironolactone (ALDACTONE) 25 MG tablet and just take metolazone (ZAROXOLYN) 5 MG tablet. Patient does like how she been feeling with the new medication. Patient states she Please advise

## 2021-05-28 ENCOUNTER — Other Ambulatory Visit: Payer: Self-pay

## 2021-05-28 ENCOUNTER — Ambulatory Visit (HOSPITAL_BASED_OUTPATIENT_CLINIC_OR_DEPARTMENT_OTHER): Payer: Medicare Other | Attending: Cardiology | Admitting: Cardiovascular Disease

## 2021-05-28 DIAGNOSIS — Z6835 Body mass index (BMI) 35.0-35.9, adult: Secondary | ICD-10-CM | POA: Diagnosis not present

## 2021-05-28 DIAGNOSIS — G4733 Obstructive sleep apnea (adult) (pediatric): Secondary | ICD-10-CM | POA: Insufficient documentation

## 2021-05-28 DIAGNOSIS — G478 Other sleep disorders: Secondary | ICD-10-CM | POA: Diagnosis not present

## 2021-05-28 DIAGNOSIS — R400 Somnolence: Secondary | ICD-10-CM | POA: Insufficient documentation

## 2021-05-28 DIAGNOSIS — E669 Obesity, unspecified: Secondary | ICD-10-CM | POA: Insufficient documentation

## 2021-05-28 DIAGNOSIS — R0683 Snoring: Secondary | ICD-10-CM | POA: Insufficient documentation

## 2021-06-02 ENCOUNTER — Other Ambulatory Visit: Payer: Self-pay

## 2021-06-02 DIAGNOSIS — I5032 Chronic diastolic (congestive) heart failure: Secondary | ICD-10-CM

## 2021-06-02 DIAGNOSIS — E876 Hypokalemia: Secondary | ICD-10-CM

## 2021-06-02 DIAGNOSIS — I272 Pulmonary hypertension, unspecified: Secondary | ICD-10-CM

## 2021-06-02 DIAGNOSIS — Z79899 Other long term (current) drug therapy: Secondary | ICD-10-CM

## 2021-06-03 LAB — COMPREHENSIVE METABOLIC PANEL
ALT: 30 IU/L (ref 0–32)
AST: 29 IU/L (ref 0–40)
Albumin/Globulin Ratio: 1.5 (ref 1.2–2.2)
Albumin: 4.1 g/dL (ref 3.7–4.7)
Alkaline Phosphatase: 80 IU/L (ref 44–121)
BUN/Creatinine Ratio: 26 (ref 12–28)
BUN: 47 mg/dL — ABNORMAL HIGH (ref 8–27)
Bilirubin Total: 0.6 mg/dL (ref 0.0–1.2)
CO2: 26 mmol/L (ref 20–29)
Calcium: 10.4 mg/dL — ABNORMAL HIGH (ref 8.7–10.3)
Chloride: 98 mmol/L (ref 96–106)
Creatinine, Ser: 1.83 mg/dL — ABNORMAL HIGH (ref 0.57–1.00)
Globulin, Total: 2.8 g/dL (ref 1.5–4.5)
Glucose: 105 mg/dL — ABNORMAL HIGH (ref 70–99)
Potassium: 3.9 mmol/L (ref 3.5–5.2)
Sodium: 148 mmol/L — ABNORMAL HIGH (ref 134–144)
Total Protein: 6.9 g/dL (ref 6.0–8.5)
eGFR: 29 mL/min/{1.73_m2} — ABNORMAL LOW (ref >59)

## 2021-06-04 ENCOUNTER — Telehealth: Payer: Self-pay | Admitting: *Deleted

## 2021-06-04 DIAGNOSIS — N1831 Chronic kidney disease, stage 3a: Secondary | ICD-10-CM

## 2021-06-04 DIAGNOSIS — Z79899 Other long term (current) drug therapy: Secondary | ICD-10-CM

## 2021-06-04 NOTE — Telephone Encounter (Signed)
The patient 's husband has been notified of the result and verbalized understanding.  All questions (if any) were answered. Increase fluid intake 72 oz.   Lab- BMP ordered ( @feb1 ,2023 )  Referral for nephrology placed. Aware to hold Metolazone for 2 weeks  and hold spironolactone for 3 days Raiford Simmonds, RN 06/04/2021 12:02 PM

## 2021-06-04 NOTE — Telephone Encounter (Signed)
-----   Message from Leonie Man, MD sent at 06/04/2021 12:32 AM EST ----- Labs show kidney function is little worse than a month ago.   Need to assess if she is having worsening swelling, and happy to take metolazone more frequently  It would be nice to back off on diuretic-i.e. do not take metolazone for 2 weeks unless absolutely necessary.  Also hold spironolactone for 3 days.  Increase PO fluid intake ~72 oz/day Should recheck BMP level in 2 weeks.  Would also like to refer to nephrology just to establish baseline care  Glenetta Hew, MD

## 2021-06-07 DIAGNOSIS — M5412 Radiculopathy, cervical region: Secondary | ICD-10-CM | POA: Insufficient documentation

## 2021-06-16 ENCOUNTER — Encounter (HOSPITAL_BASED_OUTPATIENT_CLINIC_OR_DEPARTMENT_OTHER): Payer: Self-pay | Admitting: Cardiovascular Disease

## 2021-06-16 ENCOUNTER — Telehealth: Payer: Self-pay | Admitting: Cardiovascular Disease

## 2021-06-16 NOTE — Telephone Encounter (Signed)
Patient's spouse is calling for the results for her sleep test.  Wants to know what the next step is.

## 2021-06-16 NOTE — Procedures (Signed)
Patient Name: Stephanie Franco, Stephanie Franco Date: 05/28/2021 Gender: Female D.O.B: 11-24-1950 Age (years): 70 Referring Provider: Glenetta Hew Height (inches): 54 Interpreting Physician: Shelva Majestic MD, ABSM Weight (lbs): 195 RPSGT: Zadie Rhine BMI: 35 MRN: [id number] Neck Size: 16.00  CLINICAL INFORMATION Sleep Study Type: NPSG  Indication for sleep study: Obesity, OSA, Snoring  Epworth Sleepiness Score: 15  Most recent polysomnogram dated 08/22/2015 revealed an AHI of 2/h and RDI of 3/h.  SLEEP STUDY TECHNIQUE As per the AASM Manual for the Scoring of Sleep and Associated Events v2.3 (April 2016) with a hypopnea requiring 4% desaturations.  The channels recorded and monitored were frontal, central and occipital EEG, electrooculogram (EOG), submentalis EMG (chin), nasal and oral airflow, thoracic and abdominal wall motion, anterior tibialis EMG, snore microphone, electrocardiogram, and pulse oximetry.  MEDICATIONS albuterol (PROVENTIL HFA;VENTOLIN HFA) 108 (90 Base) MCG/ACT inhaler albuterol (PROVENTIL) (2.5 MG/3ML) 0.083% nebulizer solution allopurinol (ZYLOPRIM) 300 MG tablet blood glucose meter kit and supplies KIT Colchicine 0.6 MG CAPS ELIQUIS 5 MG TABS tablet ezetimibe (ZETIA) 10 MG tablet ferrous sulfate 325 (65 FE) MG EC tablet fexofenadine (ALLEGRA) 180 MG tablet fluticasone (FLONASE) 50 MCG/ACT nasal spray furosemide (LASIX) 80 MG tablet gabapentin (NEURONTIN) 600 MG tablet glucose blood test strip Insulin Glargine 300 UNIT/ML SOPN isosorbide mononitrate (IMDUR) 60 MG 24 hr tablet LORazepam (ATIVAN) 1 MG tablet metolazone (ZAROXOLYN) 5 MG tablet Multiple Vitamins-Minerals (MULTIVITAMIN WITH MINERALS) tablet nebivolol (BYSTOLIC) 10 MG tablet nitroGLYCERIN (NITROSTAT) 0.4 MG SL tablet pantoprazole (PROTONIX) 40 MG tablet potassium chloride SA (KLOR-CON M20) 20 MEQ tablet rosuvastatin (CRESTOR) 40 MG tablet spironolactone (ALDACTONE) 25 MG  tablet triamcinolone ointment (KENALOG) 0.5 %  Medications self-administered by patient taken the night of the study : LORAZEPAM, ALLOPURINOL, GABAPENTIN  SLEEP ARCHITECTURE The study was initiated at 10:23:11 PM and ended at 5:01:58 AM.  Sleep onset time was 35.5 minutes and the sleep efficiency was 60.8%%. The total sleep time was 242.5 minutes.  Stage REM latency was 119.5 minutes.  The patient spent 10.1%% of the night in stage N1 sleep, 87.0%% in stage N2 sleep, 0.0%% in stage N3 and 2.9% in REM.  Alpha intrusion was absent.  Supine sleep was 100.00%.  RESPIRATORY PARAMETERS The overall apnea/hypopnea index (AHI) was 1.2 per hour. The respiratory disturbance index (RDI) was 2.7/h. There were 0 total apneas, including 0 obstructive, 0 central and 0 mixed apneas. There were 5 hypopneas and 6 RERAs.  The AHI during Stage REM sleep was 0.0 per hour.  AHI while supine was 1.2 per hour.  The mean oxygen saturation was 92.4%. The minimum SpO2 during sleep was 87.0%.  Mild to moderate snoring was noted during this study.  CARDIAC DATA The 2 lead EKG demonstrated sinus rhythm. The mean heart rate was 72.6 beats per minute. Other EKG findings include: None.  LEG MOVEMENT DATA The total PLMS were 0 with a resulting PLMS index of 0.0. Associated arousal with leg movement index was 0.0 .  IMPRESSIONS - No significant obstructive sleep apnea occurred during this study (AHI 1.2/h; RDI 2.7/h). - Mild oxygen desaturation to a nadir of 87%. - The patient snored with soft snoring volume. - No cardiac abnormalities were noted during this study. - Clinically significant periodic limb movements did not occur during sleep. No significant associated arousals.  DIAGNOSIS - Nocturnal Hypoxemia (G47.36) - Snoring - Excessive Daytime Sleepiness - Increased Upper Airway Resistance  RECOMMENDATIONS - At present there is no indication for CPAP therapy.  - Effort should be  made to optimize  nasal and oropharyngeal patency. - If patient continues to have significant daytime sleepiness consider a PSG followed by a Multiple Sleep Latency Test (MSLT) to evaluate for narcolepsy or idiopathic hypersomnolence.  - Avoid alcohol, sedatives and other CNS depressants that may worsen sleep apnea and disrupt normal sleep architecture. - Optimize sleep duration at 7 - 9 hours. - Sleep hygiene should be reviewed to assess factors that may improve sleep quality. - Weight management (BMI 35) and regular exercise should be initiated or continued if appropriate.  [Electronically signed] 06/16/2021 11:14 AM  Shelva Majestic MD, [facility], [place], American Board of Sleep Medicine   NPI: [id number] [place] PH: [phone]   FX: [phone] [place]

## 2021-06-16 NOTE — Telephone Encounter (Signed)
-----   Message from Troy Sine, MD sent at 06/16/2021 11:21 AM EST ----- Mariann Laster, please notify patient of the results.  There is no significant sleep apnea.  However, the patient has significant excessive daytime sleepiness and if she is getting adequate sleep duration may consider evaluation for idiopathic hypersomnia with a subsequent PSG followed by an multiple sleep latency test (MSLT).

## 2021-06-16 NOTE — Telephone Encounter (Signed)
Patient's husband notified of sleep study results and recommendations. He states the patient may sleep approx. 5 hours per night. He will discuss with her if she wants to proceed with further testing and call be back if she does.

## 2021-06-16 NOTE — Progress Notes (Signed)
Sleep study test did not show evidence of significant sleep apnea. There was evidence of mild dropping oxygen levels to 87% while sleeping.   DIAGNOSIS - Nocturnal Hypoxemia (G47.36) - Snoring - Excessive Daytime Sleepiness - Increased Upper Airway Resistance   RECOMMENDATIONS - At present there is no indication for CPAP therapy.  - Effort should be made to optimize nasal and oropharyngeal patency. ->  This involves things like nasal Breathe Right strips and dental dam (usually from Dentist or ENT MD).  - Avoid alcohol, sedatives and other Central Nervous System depressants that may worsen sleep apnea and disrupt normal sleep architecture. - Optimize sleep duration at 7 - 9 hours. - Sleep hygiene should be reviewed to assess factors that may improve sleep quality. - Weight management (BMI 35) and regular exercise should be initiated or continued if appropriate.   Glenetta Hew, MD

## 2021-06-16 NOTE — Telephone Encounter (Signed)
LMTCB

## 2021-06-18 DIAGNOSIS — I420 Dilated cardiomyopathy: Secondary | ICD-10-CM

## 2021-06-18 DIAGNOSIS — I5042 Chronic combined systolic (congestive) and diastolic (congestive) heart failure: Secondary | ICD-10-CM | POA: Insufficient documentation

## 2021-06-18 HISTORY — DX: Dilated cardiomyopathy: I42.0

## 2021-06-20 ENCOUNTER — Other Ambulatory Visit: Payer: Self-pay

## 2021-06-20 ENCOUNTER — Emergency Department (HOSPITAL_COMMUNITY): Payer: Medicare Other

## 2021-06-20 ENCOUNTER — Inpatient Hospital Stay (HOSPITAL_COMMUNITY)
Admission: EM | Admit: 2021-06-20 | Discharge: 2021-06-27 | DRG: 280 | Disposition: A | Discharge status: Home / Self Care | Payer: Medicare Other | Attending: Cardiology | Admitting: Cardiology

## 2021-06-20 DIAGNOSIS — I214 Non-ST elevation (NSTEMI) myocardial infarction: Secondary | ICD-10-CM | POA: Diagnosis present

## 2021-06-20 DIAGNOSIS — I5022 Chronic systolic (congestive) heart failure: Secondary | ICD-10-CM | POA: Diagnosis not present

## 2021-06-20 DIAGNOSIS — Z8249 Family history of ischemic heart disease and other diseases of the circulatory system: Secondary | ICD-10-CM | POA: Diagnosis not present

## 2021-06-20 DIAGNOSIS — I130 Hypertensive heart and chronic kidney disease with heart failure and stage 1 through stage 4 chronic kidney disease, or unspecified chronic kidney disease: Secondary | ICD-10-CM | POA: Diagnosis present

## 2021-06-20 DIAGNOSIS — I252 Old myocardial infarction: Secondary | ICD-10-CM | POA: Diagnosis not present

## 2021-06-20 DIAGNOSIS — I2510 Atherosclerotic heart disease of native coronary artery without angina pectoris: Secondary | ICD-10-CM | POA: Diagnosis not present

## 2021-06-20 DIAGNOSIS — I255 Ischemic cardiomyopathy: Secondary | ICD-10-CM | POA: Diagnosis present

## 2021-06-20 DIAGNOSIS — Z853 Personal history of malignant neoplasm of breast: Secondary | ICD-10-CM

## 2021-06-20 DIAGNOSIS — Z79899 Other long term (current) drug therapy: Secondary | ICD-10-CM | POA: Diagnosis not present

## 2021-06-20 DIAGNOSIS — E1122 Type 2 diabetes mellitus with diabetic chronic kidney disease: Secondary | ICD-10-CM | POA: Diagnosis present

## 2021-06-20 DIAGNOSIS — Z20822 Contact with and (suspected) exposure to covid-19: Secondary | ICD-10-CM | POA: Diagnosis present

## 2021-06-20 DIAGNOSIS — Z9012 Acquired absence of left breast and nipple: Secondary | ICD-10-CM

## 2021-06-20 DIAGNOSIS — I25119 Atherosclerotic heart disease of native coronary artery with unspecified angina pectoris: Principal | ICD-10-CM | POA: Diagnosis present

## 2021-06-20 DIAGNOSIS — I2721 Secondary pulmonary arterial hypertension: Secondary | ICD-10-CM | POA: Diagnosis present

## 2021-06-20 DIAGNOSIS — K5900 Constipation, unspecified: Secondary | ICD-10-CM | POA: Diagnosis not present

## 2021-06-20 DIAGNOSIS — I200 Unstable angina: Secondary | ICD-10-CM | POA: Diagnosis present

## 2021-06-20 DIAGNOSIS — K219 Gastro-esophageal reflux disease without esophagitis: Secondary | ICD-10-CM | POA: Diagnosis present

## 2021-06-20 DIAGNOSIS — N184 Chronic kidney disease, stage 4 (severe): Secondary | ICD-10-CM | POA: Diagnosis present

## 2021-06-20 DIAGNOSIS — R778 Other specified abnormalities of plasma proteins: Secondary | ICD-10-CM | POA: Diagnosis not present

## 2021-06-20 DIAGNOSIS — Z6834 Body mass index (BMI) 34.0-34.9, adult: Secondary | ICD-10-CM | POA: Diagnosis not present

## 2021-06-20 DIAGNOSIS — E782 Mixed hyperlipidemia: Secondary | ICD-10-CM | POA: Diagnosis not present

## 2021-06-20 DIAGNOSIS — I5023 Acute on chronic systolic (congestive) heart failure: Secondary | ICD-10-CM | POA: Diagnosis present

## 2021-06-20 DIAGNOSIS — Z95828 Presence of other vascular implants and grafts: Secondary | ICD-10-CM

## 2021-06-20 DIAGNOSIS — I5021 Acute systolic (congestive) heart failure: Secondary | ICD-10-CM | POA: Diagnosis not present

## 2021-06-20 DIAGNOSIS — E785 Hyperlipidemia, unspecified: Secondary | ICD-10-CM | POA: Diagnosis present

## 2021-06-20 DIAGNOSIS — E118 Type 2 diabetes mellitus with unspecified complications: Secondary | ICD-10-CM | POA: Diagnosis not present

## 2021-06-20 DIAGNOSIS — Z7901 Long term (current) use of anticoagulants: Secondary | ICD-10-CM

## 2021-06-20 DIAGNOSIS — Z87891 Personal history of nicotine dependence: Secondary | ICD-10-CM

## 2021-06-20 DIAGNOSIS — I5043 Acute on chronic combined systolic (congestive) and diastolic (congestive) heart failure: Secondary | ICD-10-CM | POA: Diagnosis not present

## 2021-06-20 DIAGNOSIS — Z96641 Presence of right artificial hip joint: Secondary | ICD-10-CM | POA: Diagnosis present

## 2021-06-20 DIAGNOSIS — I4821 Permanent atrial fibrillation: Secondary | ICD-10-CM | POA: Diagnosis present

## 2021-06-20 DIAGNOSIS — I4811 Longstanding persistent atrial fibrillation: Secondary | ICD-10-CM

## 2021-06-20 DIAGNOSIS — I340 Nonrheumatic mitral (valve) insufficiency: Secondary | ICD-10-CM | POA: Diagnosis present

## 2021-06-20 DIAGNOSIS — G4733 Obstructive sleep apnea (adult) (pediatric): Secondary | ICD-10-CM | POA: Diagnosis present

## 2021-06-20 DIAGNOSIS — E876 Hypokalemia: Secondary | ICD-10-CM | POA: Diagnosis present

## 2021-06-20 DIAGNOSIS — I10 Essential (primary) hypertension: Secondary | ICD-10-CM | POA: Diagnosis not present

## 2021-06-20 DIAGNOSIS — N1832 Chronic kidney disease, stage 3b: Secondary | ICD-10-CM | POA: Diagnosis not present

## 2021-06-20 DIAGNOSIS — N179 Acute kidney failure, unspecified: Secondary | ICD-10-CM | POA: Diagnosis present

## 2021-06-20 DIAGNOSIS — Z794 Long term (current) use of insulin: Secondary | ICD-10-CM

## 2021-06-20 LAB — BASIC METABOLIC PANEL
Anion gap: 15 (ref 5–15)
BUN: 36 mg/dL — ABNORMAL HIGH (ref 8–23)
CO2: 27 mmol/L (ref 22–32)
Calcium: 10.4 mg/dL — ABNORMAL HIGH (ref 8.9–10.3)
Chloride: 95 mmol/L — ABNORMAL LOW (ref 98–111)
Creatinine, Ser: 2.11 mg/dL — ABNORMAL HIGH (ref 0.44–1.00)
GFR, Estimated: 24 mL/min — ABNORMAL LOW (ref >60)
Glucose, Bld: 132 mg/dL — ABNORMAL HIGH (ref 70–99)
Potassium: 2.8 mmol/L — ABNORMAL LOW (ref 3.5–5.1)
Sodium: 137 mmol/L (ref 135–145)

## 2021-06-20 LAB — CBC
HCT: 39.8 % (ref 36.0–46.0)
Hemoglobin: 12.5 g/dL (ref 12.0–15.0)
MCH: 29.1 pg (ref 26.0–34.0)
MCHC: 31.4 g/dL (ref 30.0–36.0)
MCV: 92.6 fL (ref 80.0–100.0)
Platelets: 231 10*3/uL (ref 150–400)
RBC: 4.3 MIL/uL (ref 3.87–5.11)
RDW: 16.7 % — ABNORMAL HIGH (ref 11.5–15.5)
WBC: 12.8 10*3/uL — ABNORMAL HIGH (ref 4.0–10.5)
nRBC: 0 % (ref 0.0–0.2)

## 2021-06-20 LAB — TROPONIN I (HIGH SENSITIVITY)
Troponin I (High Sensitivity): 71 ng/L — ABNORMAL HIGH (ref <18)
Troponin I (High Sensitivity): 58 ng/L — ABNORMAL HIGH (ref <18)
Troponin I (High Sensitivity): 51 ng/L — ABNORMAL HIGH (ref <18)

## 2021-06-20 LAB — CBG MONITORING, ED: Glucose-Capillary: 124 mg/dL — ABNORMAL HIGH (ref 70–99)

## 2021-06-20 LAB — GLUCOSE, CAPILLARY
Glucose-Capillary: 139 mg/dL — ABNORMAL HIGH (ref 70–99)
Glucose-Capillary: 231 mg/dL — ABNORMAL HIGH (ref 70–99)

## 2021-06-20 MED ORDER — NITROGLYCERIN IN D5W 200-5 MCG/ML-% IV SOLN
0.0000 ug/min | INTRAVENOUS | Status: DC
  Filled 2021-06-20: qty 250

## 2021-06-20 MED ORDER — NEBIVOLOL HCL 10 MG PO TABS
10.0000 mg | ORAL_TABLET | Freq: Every day | ORAL | Status: DC
  Administered 2021-06-20 – 2021-06-22 (×3): 10 mg via ORAL
  Filled 2021-06-20 (×4): qty 1

## 2021-06-20 MED ORDER — NITROGLYCERIN 0.4 MG SL SUBL: 0.4000 mg | SUBLINGUAL_TABLET | SUBLINGUAL | Status: DC | PRN

## 2021-06-20 MED ORDER — INSULIN GLARGINE 100 UNIT/ML ~~LOC~~ SOLN
40.0000 [IU] | Freq: Two times a day (BID) | SUBCUTANEOUS | Status: DC
  Filled 2021-06-20: qty 0.4

## 2021-06-20 MED ORDER — INSULIN GLARGINE-YFGN 100 UNIT/ML ~~LOC~~ SOLN
40.0000 [IU] | Freq: Two times a day (BID) | SUBCUTANEOUS | Status: DC
  Administered 2021-06-20 – 2021-06-27 (×13): 40 [IU] via SUBCUTANEOUS
  Filled 2021-06-20 (×16): qty 0.4

## 2021-06-20 MED ORDER — ONDANSETRON HCL 4 MG/2ML IJ SOLN: 4.0000 mg | Freq: Four times a day (QID) | INTRAMUSCULAR | Status: DC | PRN

## 2021-06-20 MED ORDER — GABAPENTIN 600 MG PO TABS
600.0000 mg | ORAL_TABLET | Freq: Every day | ORAL | Status: DC
  Administered 2021-06-21 – 2021-06-26 (×6): 600 mg via ORAL
  Filled 2021-06-20 (×8): qty 1

## 2021-06-20 MED ORDER — HEPARIN BOLUS VIA INFUSION
4000.0000 [IU] | Freq: Once | INTRAVENOUS | Status: DC
  Filled 2021-06-20: qty 4000

## 2021-06-20 MED ORDER — ASPIRIN 81 MG PO CHEW: 324.0000 mg | CHEWABLE_TABLET | Freq: Once | ORAL | Status: DC

## 2021-06-20 MED ORDER — ROSUVASTATIN CALCIUM 20 MG PO TABS
40.0000 mg | ORAL_TABLET | Freq: Every day | ORAL | Status: DC
  Administered 2021-06-21 – 2021-06-27 (×7): 40 mg via ORAL
  Filled 2021-06-20 (×7): qty 2

## 2021-06-20 MED ORDER — ALBUTEROL SULFATE (2.5 MG/3ML) 0.083% IN NEBU: 2.5000 mg | INHALATION_SOLUTION | Freq: Four times a day (QID) | RESPIRATORY_TRACT | Status: DC | PRN

## 2021-06-20 MED ORDER — ACETAMINOPHEN 325 MG PO TABS
650.0000 mg | ORAL_TABLET | ORAL | Status: DC | PRN
  Administered 2021-06-21 – 2021-06-22 (×3): 650 mg via ORAL
  Filled 2021-06-20 (×3): qty 2

## 2021-06-20 MED ORDER — POLYETHYLENE GLYCOL 3350 17 G PO PACK
17.0000 g | PACK | Freq: Every day | ORAL | Status: DC | PRN
  Administered 2021-06-20 – 2021-06-24 (×3): 17 g via ORAL
  Filled 2021-06-20 (×3): qty 1

## 2021-06-20 MED ORDER — POTASSIUM CHLORIDE CRYS ER 20 MEQ PO TBCR
40.0000 meq | EXTENDED_RELEASE_TABLET | Freq: Two times a day (BID) | ORAL | Status: AC
  Administered 2021-06-20 – 2021-06-21 (×2): 40 meq via ORAL
  Filled 2021-06-20 (×2): qty 2

## 2021-06-20 MED ORDER — HEPARIN (PORCINE) 25000 UT/250ML-% IV SOLN
1050.0000 [IU]/h | INTRAVENOUS | Status: DC
  Administered 2021-06-20: 1100 [IU]/h via INTRAVENOUS
  Administered 2021-06-21 – 2021-06-22 (×2): 1000 [IU]/h via INTRAVENOUS
  Filled 2021-06-20 (×3): qty 250

## 2021-06-20 MED ORDER — PANTOPRAZOLE SODIUM 40 MG PO TBEC
40.0000 mg | DELAYED_RELEASE_TABLET | Freq: Every day | ORAL | Status: DC
  Administered 2021-06-20 – 2021-06-27 (×8): 40 mg via ORAL
  Filled 2021-06-20 (×8): qty 1

## 2021-06-20 MED ORDER — EZETIMIBE 10 MG PO TABS
10.0000 mg | ORAL_TABLET | Freq: Every day | ORAL | Status: DC
  Administered 2021-06-21 – 2021-06-27 (×7): 10 mg via ORAL
  Filled 2021-06-20 (×7): qty 1

## 2021-06-20 MED ORDER — POTASSIUM CHLORIDE 10 MEQ/100ML IV SOLN: 10.0000 meq | INTRAVENOUS | Status: DC

## 2021-06-20 NOTE — ED Triage Notes (Signed)
Pt BIB [facility] from home after being awakened from sleep with chest pain. EMS reports multifocal PVCs on the monitor upon their arrival. Pt took 4 nitro at home with no relief, husband called EMS. 324 aspirin with EMS. VS BP 110/74, HR 70s, 99% RA CBG 146

## 2021-06-20 NOTE — Progress Notes (Signed)
ANTICOAGULATION CONSULT NOTE - Initial Consult  Pharmacy Consult for heparin Indication: chest pain/ACS  No Known Allergies  Patient Measurements: Height: 5\' 3"  (160 cm) Weight: 84.4 kg (186 lb) IBW/kg (Calculated) : 52.4 Heparin Dosing Weight: 71 kg   Vital Signs: BP: 117/59 (02/03 1545) Pulse Rate: 65 (02/03 1545)  Labs: Recent Labs    06/20/21 1329  HGB 12.5  HCT 39.8  PLT 231  CREATININE 2.11*  TROPONINIHS 71*    Estimated Creatinine Clearance: 24.8 mL/min (A) (by C-G formula based on SCr of 2.11 mg/dL (H)).   Medical History: Past Medical History:  Diagnosis Date   Aortic atherosclerosis (HCC)    Arthritis    Asthma    Atrial fibrillation, permanent (HCC)    Rate control with Bystolic. CHA2DS2Vasc = 6 (HTN, DM, CHF, Age 71, Female) -> on Pradaxa   Breast cancer ([place]) 2008   S/P mastectomy   CAD S/P percutaneous coronary angioplasty 2006;    PCI of circumflex with Taxus DES;; relook-cath Feb 2013: 50-60% short lesion in RCA, 40% ISR Circumflex stent.   CKD (chronic kidney disease), stage III (HCC)    Complication of anesthesia    prolonged sedation after colonoscopy in [place]   Diabetes mellitus, uncontrolled 07/14/2011   Dilated cardiomyopathy (HCC)    Mostly Resolved (EF up from ~25% to ~45% by Echo)   DOE (dyspnea on exertion)    Edema of both legs    Usually mild, chronic. Controlled with when necessary furosemide and diet   Fibroid, uterine 07/13/11   "have that now"   GERD (gastroesophageal reflux disease)    Gout    Hyperlipidemia    Hypertension    Hypokalemia    Morbid obesity ([place])    BMI 41   OSA on CPAP    On CPAP   Pulmonary hypertension, unspecified (HCC)    PAP ~90 mmHg on Echo 12/2017 -- has OSA on CPAP & obesity - but Overall Improved Sx of dyspnea / edema   Systolic murmur     Medications:  (Not in a hospital admission)   Assessment: 71 YOF with chest pain concerning for ACS. Pharmacy consulted to start IV heparin. Of note,  patient is on Eliquis at home for h/o Afib. Last dose was last night per patient.    H/H and Plt wnl  Goal of Therapy:  Heparin level 0.3-0.7 units/ml Monitor platelets by anticoagulation protocol: Yes   Plan:  -Heparin 1100 units/hr. No bolus  -F/u 8 hr aPTT and HL -Monitor daily aPTT, HL, CBC, and s/s of bleeding   Albertina Parr, PharmD., BCCCP Clinical Pharmacist Please refer to [facility] for unit-specific pharmacist

## 2021-06-20 NOTE — H&P (Signed)
Cardiology Admission History and Physical:   Patient ID: Stephanie Franco MRN: [id number]; DOB: 1951-02-15   Admission date: 06/20/2021  PCP:  Lin Landsman, MD   [facility] Providers Cardiologist:  Glenetta Hew, MD   {   Chief Complaint:  Chest pain  Patient Profile:   Stephanie Franco is a 71 y.o. female with past medical history of permanent atrial fibrillation on Eliquis, CAD, history of systolic heart failure with improved EF, hypertension, hyperlipidemia, DM2 and morbid obesity who is being seen 06/20/2021 for the evaluation of chest pain.  History of Present Illness:   Stephanie Franco is a 71 yo female with with past medical history of permanent atrial fibrillation on Eliquis, CAD, history of systolic heart failure with improved EF, hypertension, hyperlipidemia, DM2 and morbid obesity.  Patient had PCI in 2005 in [place].  Last cardiac catheterization performed on 07/12/2011 demonstrated 50 to 60% tubular mid RCA lesion followed by 40% distal RCA lesion, widely patent moderate RPDA, widely patent left main and LAD, 40% in-stent restenosis in the OM 2, 30 to 40% AV groove circumflex lesion, EF 50 to 55%.  No culprit lesion was identified.  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  Since then, ejection fraction has improved on subsequent echocardiogram.  Most recent echocardiogram obtained on 01/26/2019 demonstrated EF 50 to 55%, mild to moderate TR, moderate MR.  Last Myoview obtained on 10/26/2019 was low risk with normal perfusion.  Patient was in her usual state of health until she woke up around 11 AM on 06/20/2021 with a burning sensation in the substernal area.  Symptoms lasted a total of 2 hours before resolving.  She called EMS and received 4 sublingual nitroglycerin and 4 baby aspirin.  By the time she arrived in [place], ED, her chest pain has resolved.  She denies any shortness of breath with exertion  recently nor has she had any chest discomfort prior to today.  Last dose of Eliquis was last night around 10:30 PM.  While in the emergency room, chest pain restarted around 3:30 PM and the patient has been placed on IV nitro.  On initial arrival, heart rate and blood pressure were normal.  Significant lab work include potassium 2.8, creatinine 2.11, white blood cell count 12.8.  First troponin 71.  Note, her previous creatinine has been around 1.5-1.8.  She has been referred to Dr. Corliss Parish of nephrology service however has not been seen yet.  Past Medical History:  Diagnosis Date   Aortic atherosclerosis (HCC)    Arthritis    Asthma    Atrial fibrillation, permanent ([place])    Rate control with Bystolic. CHA2DS2Vasc = 6 (HTN, DM, CHF, Age 54, Female) -> on Pradaxa   Breast cancer ([place]) 2008   S/P mastectomy   CAD S/P percutaneous coronary angioplasty 2006;    PCI of circumflex with Taxus DES;; relook-cath Feb 2013: 50-60% short lesion in RCA, 40% ISR Circumflex stent.   CKD (chronic kidney disease), stage III (HCC)    Complication of anesthesia    prolonged sedation after colonoscopy in [place]   Diabetes mellitus, uncontrolled 07/14/2011   Dilated cardiomyopathy (HCC)    Mostly Resolved (EF up from ~25% to ~45% by Echo)   DOE (dyspnea on exertion)    Edema of both legs    Usually mild, chronic. Controlled with when necessary furosemide and diet   Fibroid, uterine 07/13/11   "have that  now"   GERD (gastroesophageal reflux disease)    Gout    Hyperlipidemia    Hypertension    Hypokalemia    Morbid obesity ([place])    BMI 41   OSA on CPAP    On CPAP   Pulmonary hypertension, unspecified (HCC)    PAP ~90 mmHg on Echo 12/2017 -- has OSA on CPAP & obesity - but Overall Improved Sx of dyspnea / edema   Systolic murmur     Past Surgical History:  Procedure Laterality Date   COLONOSCOPY     CORONARY ANGIOPLASTY WITH STENT PLACEMENT  2006   stent to circumflex   DILATION AND  CURETTAGE OF UTERUS     LEFT HEART CATHETERIZATION WITH CORONARY ANGIOGRAM N/A 07/13/2011   Procedure: LEFT HEART CATHETERIZATION WITH CORONARY ANGIOGRAM;  Surgeon: Leonie Man, MD;  Location: [facility] C[facility] LAB;  Service: Cardiovascular;  normal EF; 44m 50-60% lesion in RCA; patent stent in circumflex form 2006 with ~40% in-stent re-stenosis   MASTECTOMY  2008   left   NM MYOVIEW LTD  7/'16; 6/'21   LOW RISK. No ischemia or infarction. Not gated 2/2  A. fib - unable to assess EF   TOTAL HIP ARTHROPLASTY Right 01/19/2018   Procedure: RIGHT TOTAL HIP ARTHROPLASTY ANTERIOR APPROACH;  Surgeon: SRod Can MD;  Location: [facility] ORS;  Service: Orthopedics;  Laterality: Right;  Needs RNFA   TRANSTHORACIC ECHOCARDIOGRAM  6/'15; 9/'16   a) In setting of Urosepsis: EF ~25 %, global HK (poor quality study);; b) LV EF 40-45%, Mild Anteroseptal HK. Mod-Severe  RA dilation with elevated PA Pressures (~~peak 63 mmHg).. Mild LA dilation   TRANSTHORACIC ECHOCARDIOGRAM  12/2017; 04/15/2018   a) Improved LVEF to 45 to 50%.  Pulmonary HTN ~PAP ~ 60 mmHg. Severe LA dilation& mild /rv dilation.; b) Mod LVH. EF 40-45% (back to prior EF). Unable to assess DF (Afib). Severe R&L Atrial enlargement.  Moderate Post-Lateral directed MR with Mod RV dilation.  PAP ~66 mmHg.;    TRANSTHORACIC ECHOCARDIOGRAM  01/2019   relook Echo: EF back up to 50 to 55%.  Indeterminate diastolic parameters.  Moderately dilated left atrium and right atrium.  Moderate MR.  Mild to moderate TR.     Medications Prior to Admission: Prior to Admission medications   Medication Sig Start Date End Date Taking? Authorizing Provider  albuterol (PROVENTIL HFA;VENTOLIN HFA) 108 (90 Base) MCG/ACT inhaler Inhale 1-2 puffs into the lungs every 6 (six) hours as needed for wheezing or shortness of breath. 05/20/15   NHarvel Quale MD  albuterol (PROVENTIL) (2.5 MG/3ML) 0.083% nebulizer solution Take 3 mLs (2.5 mg total) by nebulization every 6 (six) hours as  needed for wheezing or shortness of breath. 05/20/15   NHarvel Quale MD  allopurinol (ZYLOPRIM) 300 MG tablet Take 300 mg by mouth daily.    [provider]  blood glucose meter kit and supplies KIT Dispense based on patient and insurance preference. Use up to four times daily as directed. (FOR ICD-9 250.00, 250.01). 04/22/18   FCharlynne Cousins MD  Colchicine 0.6 MG CAPS Take 1 tablet by mouth daily.     [provider]  ELIQUIS 5 MG TABS tablet TAKE 1 TABLET BY MOUTH TWICE A DAY 07/11/19   HLeonie Man MD  ezetimibe (ZETIA) 10 MG tablet TAKE 1 TABLET BY MOUTH EVERY DAY 03/05/21   HLeonie Man MD  ferrous sulfate 325 (65 FE) MG EC tablet Take 325 mg by mouth daily  with breakfast.    [provider]  fexofenadine (ALLEGRA) 180 MG tablet Take 180 mg by mouth daily as needed for allergies.  04/30/15   [provider]  fluticasone (FLONASE) 50 MCG/ACT nasal spray Place 2 sprays into the nose daily as needed for allergies or rhinitis.     [provider]  furosemide (LASIX) 80 MG tablet TAKE 1 TABLET BY MOUTH TWICE A DAY 01/10/21   Leonie Man, MD  gabapentin (NEURONTIN) 600 MG tablet Take 600 mg by mouth daily. Take 600 mg by mouth daily    [provider]  glucose blood test strip 1 each by Other route as needed for other. Use as instructed    [provider]  Insulin Glargine 300 UNIT/ML SOPN Inject 40 Units into the skin 2 (two) times daily before a meal.     [provider]  isosorbide mononitrate (IMDUR) 60 MG 24 hr tablet TAKE 1 TABLET BY MOUTH EVERY DAY 04/02/21   Leonie Man, MD  LORazepam (ATIVAN) 1 MG tablet Take 1 mg by mouth See admin instructions. Take 1 mg by mouth two times a day- morning and evening 07/07/16   [provider]  metolazone (ZAROXOLYN) 5 MG tablet TAKE 1 TABLET 30 MINUTES BEFORE FUROSEMIDE DAILY AS NEEDED 05/27/21   Leonie Man, MD  Multiple Vitamins-Minerals  (MULTIVITAMIN WITH MINERALS) tablet Take 1 tablet by mouth daily.    [provider]  nebivolol (BYSTOLIC) 10 MG tablet TAKE 1 TABLET BY MOUTH EVERY DAY 03/25/21   Leonie Man, MD  nitroGLYCERIN (NITROSTAT) 0.4 MG SL tablet DISSOLVE 1 TABLET UNDER THE TONGUE EVERY 5 MINUTES AS NEEDED FOR CHEST PAIN. 07/01/20   Leonie Man, MD  pantoprazole (PROTONIX) 40 MG tablet TAKE 1 TABLET BY MOUTH EVERY DAY 04/02/21   Leonie Man, MD  potassium chloride SA (KLOR-CON M20) 20 MEQ tablet TAKE 1 TABLET BY MOUTH TWICE A DAY MAY TAKE ADDITIONAL 2 TABLETS AS NEEDED/DIRECTED 04/22/21   Leonie Man, MD  rosuvastatin (CRESTOR) 40 MG tablet Take 1 tablet (40 mg total) by mouth daily. 05/14/21 08/12/21  Leonie Man, MD  spironolactone (ALDACTONE) 25 MG tablet Take 1 tablet (25 mg total) by mouth daily. 05/14/21 08/12/21  Leonie Man, MD  triamcinolone ointment (KENALOG) 0.5 % Apply to legs 2 times daily for 2 weeks Patient taking differently: Apply 1 application topically See admin instructions. Apply to legs 2 times daily for 2 weeks 05/02/18   Leonie Man, MD     Allergies:   No Known Allergies  Social History:   Social History   Socioeconomic History   Marital status: Married    Spouse name: Not on file   Number of children: 4   Years of education: Not on file   Highest education level: Not on file  Occupational History   Not on file  Tobacco Use   Smoking status: Former    Packs/day: 0.50    Years: 6.00    Pack years: 3.00    Types: Cigarettes    Quit date: 05/18/1992    Years since quitting: 29.1   Smokeless tobacco: Never  Vaping Use   Vaping Use: Never used  Substance and Sexual Activity   Alcohol use: No    Comment: 07/13/11 "have drank occasionally; not now"   Drug use: No   Sexual activity: Not Currently    Birth control/protection: Surgical  Other Topics Concern   Not on file  Social History Narrative   She is a married mother of 30, grandmother 2.  Usually accompanied by her husband. She does not work. She had been working on her exercise, but is no longer as active. Does not drink and does not smoke   Social Determinants of Radio broadcast assistant Strain: Not on file  Food Insecurity: Not on file  Transportation Needs: Not on file  Physical Activity: Not on file  Stress: Not on file  Social Connections: Not on file  Intimate Partner Violence: Not on file    Family History:   The patient's family history includes Atrial fibrillation in her son; Coronary artery disease in her mother; Healthy in her daughter, daughter, and son; Heart disease in her brother, brother, and father; Hypertension in her mother; Lung cancer in her sister; Thyroid disease in her daughter.    ROS:  Please see the history of present illness.  All other ROS reviewed and negative.     Physical Exam/Data:   Vitals:   06/20/21 1330 06/20/21 1331 06/20/21 1445 06/20/21 1545  BP: 125/75  128/76 (!) 117/59  Pulse: 66  61 65  Resp: 14  (!) 21 (!) 22  SpO2: 100%  97% 91%  Weight:  84.4 kg    Height:  _0  (1.6 m)     No intake or output data in the 24 hours ending 06/20/21 1622 Last 3 Weights 06/20/2021 05/28/2021 04/14/2021  Weight (lbs) 186 lb 195 lb 203 lb 12.8 oz  Weight (kg) 84.369 kg 88.451 kg 92.443 kg     Body mass index is 32.95 kg/m.  General:  Well nourished, well developed, in no acute distress HEENT: normal Neck: no JVD Vascular: No carotid bruits; Distal pulses 2+ bilaterally   Cardiac:  irregular; no murmur  Lungs:  clear to auscultation bilaterally, no wheezing, rhonchi or rales  Abd: soft, nontender, no hepatomegaly  Ext: no edema Musculoskeletal:  No deformities, BUE and BLE strength normal and equal Skin: warm and dry  Neuro:  CNs 2-12 intact, no focal abnormalities noted Psych:  Normal affect    EKG:  The ECG that was done and was personally reviewed and demonstrates atrial fibrillation with left bundle branch  block  Relevant CV Studies:   Cath 07/13/2011 POST-OPERATIVE DIAGNOSIS:  RCA - dominant, early mid at RV Marginal, tubular 50-60%, then distal ~40%; small caliber RPL system, moderate rPDA - no significant disease LM - widely patent, large, bifrucates --> LAD, LCx LAD - large caliber, tortuous vessel reaches around apex, several septal perforators, 1 major Diagonal that has an early branch; no significant disease Left Circumflex: Large Caliber, tortuous (almost co-dominant); 2 OMs prior to AVGroove; stent @ OM2 patent with ~40% ISR, OM1 & 2 are moderate-large caliber with no significant disease; AVGroove Circ with 30-40% lesion before branching into terminal LPLs LV Gram: EF ~50-55%, difficult to tell due to Afib, no clear WMA Hemodynamics:  AoP:  141/76 mmHg; 102 mmHg LVP:  138/9 mmHg; 19 mmHg   Echo 01/26/2019 1. The left ventricle has low normal systolic function, with an ejection  fraction of 50-55%. The cavity size was normal. There is mildly increased  left ventricular wall thickness. Left ventricular diastolic Doppler  parameters are indeterminate.   2. Global longitudinal strain is -11.3%.   3. Left atrial size was moderately dilated.   4. Right atrial size was moderately dilated.   5. The mitral valve is abnormal. Mild thickening of the mitral valve  leaflet. Mitral  valve regurgitation is moderate by color flow Doppler. The  MR jet is posteriorly-directed.   6. Tricuspid valve regurgitation is mild-moderate.    Myoview 10/26/2019 The study is normal. This is a low risk study. There was no ST segment deviation noted during stress.   Low risk stress nuclear study with normal perfusion. The study could not be gated.  Laboratory Data:  High Sensitivity Troponin:   Recent Labs  Lab 06/20/21 1329  TROPONINIHS 71*      Chemistry Recent Labs  Lab 06/20/21 1329  NA 137  K 2.8*  CL 95*  CO2 27  GLUCOSE 132*  BUN 36*  CREATININE 2.11*  CALCIUM 10.4*  GFRNONAA 24*   ANIONGAP 15    No results for input(s): PROT, ALBUMIN, AST, ALT, ALKPHOS, BILITOT in the last 168 hours. Lipids No results for input(s): CHOL, TRIG, HDL, LABVLDL, LDLCALC, CHOLHDL in the last 168 hours. Hematology Recent Labs  Lab 06/20/21 1329  WBC 12.8*  RBC 4.30  HGB 12.5  HCT 39.8  MCV 92.6  MCH 29.1  MCHC 31.4  RDW 16.7*  PLT 231   Thyroid No results for input(s): TSH, FREET4 in the last 168 hours. BNPNo results for input(s): BNP, PROBNP in the last 168 hours.  DDimer No results for input(s): DDIMER in the last 168 hours.   Radiology/Studies:  DG Chest Portable 1 View  Result Date: 06/20/2021 CLINICAL DATA:  Right upper chest pain since this morning. EXAM: PORTABLE CHEST 1 VIEW COMPARISON:  05/04/2018 FINDINGS: Mild bilateral interstitial thickening. No focal consolidation. No pleural effusion or pneumothorax. Severe stable cardiomegaly. No acute osseous abnormality. IMPRESSION: 1. Cardiomegaly with mild pulmonary vascular congestion. Electronically Signed   By: Kathreen Devoid M.D.   On: 06/20/2021 13:45     Assessment and Plan:   Chest pain with mildly elevated troponin:  -Characteristic of the chest discomfort similar to the previous angina in 2005.  Last cardiac catheterization was in 2013 which showed nonobstructive disease.  Myoview in 2021 was reassuring.  -We will discuss with MD, continue to trend troponin.  If troponin continues to go up, may need to consider cardiac catheterization once renal function improve, this will occur on Monday. Unable to do cath today due to renal function and the fact last dose of Eliquis was last night.   -Although chest x-ray revealed possible vascular congestion, however she does not appears to be significantly volume overloaded on my physical exam.  CAD: Previous PCI of OM 2 in 2005.  Last cardiac catheterization in 2013 showed nonobstructive disease  Permanent atrial fibrillation: On Eliquis 5 mg twice a day and Bystolic  Acute on  chronic renal insufficiency: Hold spironolactone and metolazone, and furosemide. Monitor renal function. Likely will need overnight IV hydration if cath is being considered for Monday  Hypokalemia: replete with 40 meq KCl x 2 doses  history of systolic heart failure with improved EF  Hypertension  Hyperlipidemia  DM2   Risk Assessment/Risk Scores:    TIMI Risk Score for Unstable Angina or Non-ST Elevation MI:   The patient's TIMI risk score is 6, which indicates a 41% risk of all cause mortality, new or recurrent myocardial infarction or need for urgent revascularization in the next 14 days.{       Severity of Illness: The appropriate patient status for this patient is INPATIENT. Inpatient status is judged to be reasonable and necessary in order to provide the required intensity of service to ensure the patient's safety. The patient's presenting  symptoms, physical exam findings, and initial radiographic and laboratory data in the context of their chronic comorbidities is felt to place them at high risk for further clinical deterioration. Furthermore, it is not anticipated that the patient will be medically stable for discharge from the hospital within 2 midnights of admission.   * I certify that at the point of admission it is my clinical judgment that the patient will require inpatient hospital care spanning beyond 2 midnights from the point of admission due to high intensity of service, high risk for further deterioration and high frequency of surveillance required.*   For questions or updates, please contact [place] Please consult [email or web address] for contact info under     Hilbert Corrigan, [place]  06/20/2021 4:22 PM

## 2021-06-20 NOTE — ED Provider Notes (Signed)
[facility] EMERGENCY DEPARTMENT Provider Note   CSN: [id number] Arrival date & time: 06/20/21  1320     History  Chief Complaint  Patient presents with   Chest Pain    Awakened from sleep    Stephanie Franco is a 71 y.o. female.  HPI Patient presents via EMS due to chest pain.  She has a history of CAD with prior stent.  She was in her usual state of health until following after awakening, when she did awaken with chest pain.  Pain was different than usual, pressure-like, as though someone was sitting on her chest.  Pain improved with nitroglycerin x4, and has not recurred.  Currently she denies dyspnea, or pain.    Home Medications Prior to Admission medications   Medication Sig Start Date End Date Taking? Authorizing Provider  albuterol (PROVENTIL HFA;VENTOLIN HFA) 108 (90 Base) MCG/ACT inhaler Inhale 1-2 puffs into the lungs every 6 (six) hours as needed for wheezing or shortness of breath. 05/20/15   Harvel Quale, MD  albuterol (PROVENTIL) (2.5 MG/3ML) 0.083% nebulizer solution Take 3 mLs (2.5 mg total) by nebulization every 6 (six) hours as needed for wheezing or shortness of breath. 05/20/15   Harvel Quale, MD  allopurinol (ZYLOPRIM) 300 MG tablet Take 300 mg by mouth daily.    [provider]  blood glucose meter kit and supplies KIT Dispense based on patient and insurance preference. Use up to four times daily as directed. (FOR ICD-9 250.00, 250.01). 04/22/18   Charlynne Cousins, MD  Colchicine 0.6 MG CAPS Take 1 tablet by mouth daily.     [provider]  ELIQUIS 5 MG TABS tablet TAKE 1 TABLET BY MOUTH TWICE A DAY 07/11/19   Leonie Man, MD  ezetimibe (ZETIA) 10 MG tablet TAKE 1 TABLET BY MOUTH EVERY DAY 03/05/21   Leonie Man, MD  ferrous sulfate 325 (65 FE) MG EC tablet Take 325 mg by mouth daily with breakfast.    [provider]  fexofenadine (ALLEGRA) 180 MG tablet Take 180 mg by mouth daily as needed for  allergies.  04/30/15   [provider]  fluticasone (FLONASE) 50 MCG/ACT nasal spray Place 2 sprays into the nose daily as needed for allergies or rhinitis.     [provider]  furosemide (LASIX) 80 MG tablet TAKE 1 TABLET BY MOUTH TWICE A DAY 01/10/21   Leonie Man, MD  gabapentin (NEURONTIN) 600 MG tablet Take 600 mg by mouth daily. Take 600 mg by mouth daily    [provider]  glucose blood test strip 1 each by Other route as needed for other. Use as instructed    [provider]  Insulin Glargine 300 UNIT/ML SOPN Inject 40 Units into the skin 2 (two) times daily before a meal.     [provider]  isosorbide mononitrate (IMDUR) 60 MG 24 hr tablet TAKE 1 TABLET BY MOUTH EVERY DAY 04/02/21   Leonie Man, MD  LORazepam (ATIVAN) 1 MG tablet Take 1 mg by mouth See admin instructions. Take 1 mg by mouth two times a day- morning and evening 07/07/16   [provider]  metolazone (ZAROXOLYN) 5 MG tablet TAKE 1 TABLET 30 MINUTES BEFORE FUROSEMIDE DAILY AS NEEDED 05/27/21   Leonie Man, MD  Multiple Vitamins-Minerals (MULTIVITAMIN WITH MINERALS) tablet Take 1 tablet by mouth daily.    [provider]  nebivolol (BYSTOLIC) 10 MG tablet TAKE 1 TABLET BY  MOUTH EVERY DAY 03/25/21   Leonie Man, MD  nitroGLYCERIN (NITROSTAT) 0.4 MG SL tablet DISSOLVE 1 TABLET UNDER THE TONGUE EVERY 5 MINUTES AS NEEDED FOR CHEST PAIN. 07/01/20   Leonie Man, MD  pantoprazole (PROTONIX) 40 MG tablet TAKE 1 TABLET BY MOUTH EVERY DAY 04/02/21   Leonie Man, MD  potassium chloride SA (KLOR-CON M20) 20 MEQ tablet TAKE 1 TABLET BY MOUTH TWICE A DAY MAY TAKE ADDITIONAL 2 TABLETS AS NEEDED/DIRECTED 04/22/21   Leonie Man, MD  rosuvastatin (CRESTOR) 40 MG tablet Take 1 tablet (40 mg total) by mouth daily. 05/14/21 08/12/21  Leonie Man, MD  spironolactone (ALDACTONE) 25 MG tablet Take 1 tablet (25 mg total) by mouth daily. 05/14/21 08/12/21   Leonie Man, MD  triamcinolone ointment (KENALOG) 0.5 % Apply to legs 2 times daily for 2 weeks Patient taking differently: Apply 1 application topically See admin instructions. Apply to legs 2 times daily for 2 weeks 05/02/18   Leonie Man, MD      Allergies    Patient has no known allergies.    Review of Systems   Review of Systems  Constitutional:        Per HPI, otherwise negative  HENT:         Per HPI, otherwise negative  Respiratory:         Per HPI, otherwise negative  Cardiovascular:        Per HPI, otherwise negative  Gastrointestinal:  Negative for vomiting.  Endocrine:       Negative aside from HPI  Genitourinary:        Neg aside from HPI   Musculoskeletal:        Per HPI, otherwise negative  Skin: Negative.   Neurological:  Negative for syncope.   Physical Exam Updated Vital Signs BP 125/75 (BP Location: Right Arm)    Pulse 66    Resp 14    Ht 5' 3"  (1.6 m)    Wt 84.4 kg    SpO2 100%    BMI 32.95 kg/m  Physical Exam Vitals and nursing note reviewed.  Constitutional:      General: She is not in acute distress.    Appearance: She is well-developed.  HENT:     Head: Normocephalic and atraumatic.  Eyes:     Conjunctiva/sclera: Conjunctivae normal.  Cardiovascular:     Rate and Rhythm: Normal rate and regular rhythm.  Pulmonary:     Effort: Pulmonary effort is normal. No respiratory distress.     Breath sounds: No stridor. Decreased breath sounds present.  Abdominal:     General: There is no distension.  Skin:    General: Skin is warm and dry.  Neurological:     Mental Status: She is alert and oriented to person, place, and time.     Cranial Nerves: No cranial nerve deficit.    ED Results / Procedures / Treatments   Labs (all labs ordered are listed, but only abnormal results are displayed) Labs Reviewed  CBC - Abnormal; Notable for the following components:      Result Value   WBC 12.8 (*)    RDW 16.7 (*)    All other components  within normal limits  CBG MONITORING, ED - Abnormal; Notable for the following components:   Glucose-Capillary 124 (*)    All other components within normal limits  BASIC METABOLIC PANEL  TROPONIN I (HIGH SENSITIVITY)    EKG EKG Interpretation  Date/Time:  Friday June 20 2021 13:26:41 EST Ventricular Rate:  71 PR Interval:    QRS Duration: 137 QT Interval:  432 QTC Calculation: 470 R Axis:   -78 Text Interpretation: Atrial fibrillation Left bundle branch block Artifact Abnormal ECG Confirmed by Carmin Muskrat [id number]) on 06/20/2021 1:28:22 PM  Radiology DG Chest Portable 1 View  Result Date: 06/20/2021 CLINICAL DATA:  Right upper chest pain since this morning. EXAM: PORTABLE CHEST 1 VIEW COMPARISON:  05/04/2018 FINDINGS: Mild bilateral interstitial thickening. No focal consolidation. No pleural effusion or pneumothorax. Severe stable cardiomegaly. No acute osseous abnormality. IMPRESSION: 1. Cardiomegaly with mild pulmonary vascular congestion. Electronically Signed   By: Kathreen Devoid M.D.   On: 06/20/2021 13:45    Procedures Procedures    Medications Ordered in ED Medications  aspirin chewable tablet 324 mg (324 mg Oral Not Given 06/20/21 1345)    ED Course/ Medical Decision Making/ A&P  This patient presents to the ED for concern of chest pain, resolved with nitroglycerin, this involves an extensive number of treatment options, and is a complaint that carries with it a high risk of complications and morbidity.  The differential diagnosis includes ACS, esophageal spasm, pneumonia, PE   Co morbidities that complicate the patient evaluation  Prior angioplasty with stent, hypertension, hyperglycemia   Social Determinants of Health:  Age   Additional history obtained:  Additional history and/or information obtained from EMS providers, chart review External records from outside source obtained and reviewed including EMS report as above, chart review notable for  cardiology visit last week, and prior history of angioplasty with stent placement in the distant past.   After the initial evaluation, orders, including: Labs, x-ray were initiated.  Patient placed on Cardiac and Pulse-Oximetry Monitors. The patient was maintained on a cardiac monitor.  The cardiac monitored showed an rhythm of sinus, 60s, unremarkable The patient was also maintained on pulse oximetry. The readings were typically a 9% room air normal   2:31 PM Initial troponin 71.  Initial labs notable for elevated creatinine, consistent with prior On repeat evaluation of the patient stayed the same is asymptomatic  4:42 PM I discussed the patient's case with our cardiology colleagues, given concern for unstable angina patient will require admission, diuretics held, can hold Eliquis, start IV heparin  Lab Tests:  I personally interpreted labs.  The pertinent results include: Elevated troponin, hypokalemia, slight worsening of renal function  Imaging Studies ordered:  I independently visualized and interpreted imaging which showed no notable findings-due to congestion I agree with the radiologist interpretation  Consultations Obtained:  I requested consultation with the cardiology team,  and discussed lab and imaging findings as well as pertinent plan -recommendation pending on admission  Dispostion / Final MDM:  After consideration of the diagnostic results and the patient's response to treatment, elderly female with elevated cardiac risk profile given history of prior STEMI, presenting with chest pain, found of hypokalemia, worsening renal function, and evidence for pulmonary vascular congestion as well as elevated troponin she will require admission for further monitoring, management.Marland Kitchen  CRITICAL CARE Performed by: Carmin Muskrat Total critical care time: 35 minutes Critical care time was exclusive of separately billable procedures and treating other patients. Critical care  was necessary to treat or prevent imminent or life-threatening deterioration. Critical care was time spent personally by me on the following activities: development of treatment plan with patient and/or surrogate as well as nursing, discussions with consultants, evaluation of patient's response to treatment, examination of patient, obtaining history from  patient or surrogate, ordering and performing treatments and interventions, ordering and review of laboratory studies, ordering and review of radiographic studies, pulse oximetry and re-evaluation of patient's condition.  Final Clinical Impression(s) / ED Diagnoses Final diagnoses:  Hypokalemia  Unstable angina [facility])        Carmin Muskrat, MD 06/20/21 [phone]

## 2021-06-21 ENCOUNTER — Inpatient Hospital Stay (HOSPITAL_COMMUNITY): Payer: Medicare Other

## 2021-06-21 ENCOUNTER — Encounter (HOSPITAL_COMMUNITY): Payer: Self-pay | Admitting: Cardiology

## 2021-06-21 ENCOUNTER — Other Ambulatory Visit: Payer: Self-pay

## 2021-06-21 DIAGNOSIS — I214 Non-ST elevation (NSTEMI) myocardial infarction: Secondary | ICD-10-CM

## 2021-06-21 HISTORY — PX: TRANSTHORACIC ECHOCARDIOGRAM: SHX275

## 2021-06-21 LAB — ECHOCARDIOGRAM COMPLETE
AR max vel: 1.58 cm2
AV Area VTI: 1.47 cm2
AV Area mean vel: 1.42 cm2
AV Mean grad: 4 mmHg
AV Peak grad: 7.1 mmHg
Ao pk vel: 1.33 m/s
Height: 63 in
MV M vel: 4.59 m/s
MV Peak grad: 84.3 mmHg
Radius: 0.6 cm
S' Lateral: 4.4 cm
Weight: 2955.2 oz

## 2021-06-21 LAB — APTT
aPTT: 94 seconds — ABNORMAL HIGH (ref 24–36)
aPTT: 114 seconds — ABNORMAL HIGH (ref 24–36)
aPTT: 105 seconds — ABNORMAL HIGH (ref 24–36)

## 2021-06-21 LAB — CBC
HCT: 39.1 % (ref 36.0–46.0)
Hemoglobin: 12.6 g/dL (ref 12.0–15.0)
MCH: 29.3 pg (ref 26.0–34.0)
MCHC: 32.2 g/dL (ref 30.0–36.0)
MCV: 90.9 fL (ref 80.0–100.0)
Platelets: 229 10*3/uL (ref 150–400)
RBC: 4.3 MIL/uL (ref 3.87–5.11)
RDW: 16.6 % — ABNORMAL HIGH (ref 11.5–15.5)
WBC: 14.6 10*3/uL — ABNORMAL HIGH (ref 4.0–10.5)
nRBC: 0 % (ref 0.0–0.2)

## 2021-06-21 LAB — BASIC METABOLIC PANEL
Anion gap: 13 (ref 5–15)
BUN: 39 mg/dL — ABNORMAL HIGH (ref 8–23)
CO2: 26 mmol/L (ref 22–32)
Calcium: 10.2 mg/dL (ref 8.9–10.3)
Chloride: 98 mmol/L (ref 98–111)
Creatinine, Ser: 2.07 mg/dL — ABNORMAL HIGH (ref 0.44–1.00)
GFR, Estimated: 25 mL/min — ABNORMAL LOW (ref >60)
Glucose, Bld: 122 mg/dL — ABNORMAL HIGH (ref 70–99)
Potassium: 3.1 mmol/L — ABNORMAL LOW (ref 3.5–5.1)
Sodium: 137 mmol/L (ref 135–145)

## 2021-06-21 LAB — GLUCOSE, CAPILLARY
Glucose-Capillary: 157 mg/dL — ABNORMAL HIGH (ref 70–99)
Glucose-Capillary: 126 mg/dL — ABNORMAL HIGH (ref 70–99)
Glucose-Capillary: 87 mg/dL (ref 70–99)
Glucose-Capillary: 142 mg/dL — ABNORMAL HIGH (ref 70–99)

## 2021-06-21 LAB — HEPARIN LEVEL (UNFRACTIONATED): Heparin Unfractionated: >1.1 IU/mL — ABNORMAL HIGH (ref 0.30–0.70)

## 2021-06-21 MED ORDER — POTASSIUM CHLORIDE CRYS ER 10 MEQ PO TBCR
20.0000 meq | EXTENDED_RELEASE_TABLET | Freq: Once | ORAL | Status: AC
  Administered 2021-06-21: 20 meq via ORAL
  Filled 2021-06-21: qty 2

## 2021-06-21 MED ORDER — ASPIRIN EC 81 MG PO TBEC
81.0000 mg | DELAYED_RELEASE_TABLET | Freq: Every day | ORAL | Status: DC
  Administered 2021-06-22 – 2021-06-25 (×3): 81 mg via ORAL
  Filled 2021-06-21 (×3): qty 1

## 2021-06-21 MED ORDER — PERFLUTREN LIPID MICROSPHERE
1.0000 mL | INTRAVENOUS | Status: AC | PRN
  Administered 2021-06-21: 2 mL via INTRAVENOUS
  Filled 2021-06-21: qty 10

## 2021-06-21 MED ORDER — HYDROCODONE-ACETAMINOPHEN 5-325 MG PO TABS
1.0000 | ORAL_TABLET | Freq: Once | ORAL | Status: AC
  Administered 2021-06-21: 1 via ORAL
  Filled 2021-06-21: qty 1

## 2021-06-21 MED ORDER — SODIUM CHLORIDE 0.9 % IV SOLN: INTRAVENOUS | Status: DC

## 2021-06-21 NOTE — Progress Notes (Signed)
°  Echocardiogram 2D Echocardiogram has been performed.  Stephanie Franco F 06/21/2021, 2:26 PM

## 2021-06-21 NOTE — Progress Notes (Signed)
ANTICOAGULATION CONSULT NOTE - Initial Consult  Pharmacy Consult for heparin Indication: chest pain/ACS  No Known Allergies  Patient Measurements: Height: 5\' 3"  (160 cm) Weight: 83.8 kg (184 lb 11.2 oz) IBW/kg (Calculated) : 52.4 Heparin Dosing Weight: 71 kg   Vital Signs: Temp: 99.2 F (37.3 C) (02/04 2004) Temp Source: Oral (02/04 2004) BP: 109/55 (02/04 2004) Pulse Rate: 76 (02/04 2004)  Labs: Recent Labs    06/20/21 1329 06/20/21 1846 06/20/21 2107 06/21/21 0414 06/21/21 1053 06/21/21 2055  HGB 12.5  --   --  12.6  --   --   HCT 39.8  --   --  39.1  --   --   PLT 231  --   --  229  --   --   APTT  --   --   --  94* 114* 105*  HEPARINUNFRC  --   --   --  >1.10*  --   --   CREATININE 2.11*  --   --  2.07*  --   --   TROPONINIHS 71* 58* 51*  --   --   --     Estimated Creatinine Clearance: 25.2 mL/min (A) (by C-G formula based on SCr of 2.07 mg/dL (H)).   Medical History: Past Medical History:  Diagnosis Date   Aortic atherosclerosis (HCC)    Arthritis    Asthma    Atrial fibrillation, permanent (HCC)    Rate control with Bystolic. CHA2DS2Vasc = 6 (HTN, DM, CHF, Age 71, Female) -> on Pradaxa   Breast cancer ([place]) 2008   S/P mastectomy   CAD S/P percutaneous coronary angioplasty 2006;    PCI of circumflex with Taxus DES;; relook-cath Feb 2013: 50-60% short lesion in RCA, 40% ISR Circumflex stent.   CKD (chronic kidney disease), stage III (HCC)    Complication of anesthesia    prolonged sedation after colonoscopy in [place]   Diabetes mellitus, uncontrolled 07/14/2011   Dilated cardiomyopathy (HCC)    Mostly Resolved (EF up from ~25% to ~45% by Echo)   DOE (dyspnea on exertion)    Edema of both legs    Usually mild, chronic. Controlled with when necessary furosemide and diet   Fibroid, uterine 07/13/11   "have that now"   GERD (gastroesophageal reflux disease)    Gout    Hyperlipidemia    Hypertension    Hypokalemia    Morbid obesity ([place])    BMI 41    OSA on CPAP    On CPAP   Pulmonary hypertension, unspecified (HCC)    PAP ~90 mmHg on Echo 12/2017 -- has OSA on CPAP & obesity - but Overall Improved Sx of dyspnea / edema   Systolic murmur     Medications:  Medications Prior to Admission  Medication Sig Dispense Refill Last Dose   albuterol (PROVENTIL HFA;VENTOLIN HFA) 108 (90 Base) MCG/ACT inhaler Inhale 1-2 puffs into the lungs every 6 (six) hours as needed for wheezing or shortness of breath. 1 Inhaler 0 Past Month   albuterol (PROVENTIL) (2.5 MG/3ML) 0.083% nebulizer solution Take 3 mLs (2.5 mg total) by nebulization every 6 (six) hours as needed for wheezing or shortness of breath. 75 mL 12 Past Month   allopurinol (ZYLOPRIM) 300 MG tablet Take 300 mg by mouth daily.   06/19/2021   ELIQUIS 5 MG TABS tablet TAKE 1 TABLET BY MOUTH TWICE A DAY (Patient taking differently: Take 5 mg by mouth 2 (two) times daily.) 180 tablet 2 06/19/2021 at 1100  ezetimibe (ZETIA) 10 MG tablet TAKE 1 TABLET BY MOUTH EVERY DAY (Patient taking differently: Take 10 mg by mouth daily.) 90 tablet 0 06/19/2021   ferrous sulfate 325 (65 FE) MG EC tablet Take 325 mg by mouth daily with breakfast.   06/19/2021   fexofenadine (ALLEGRA) 180 MG tablet Take 180 mg by mouth daily as needed for allergies.   3 Past Week   fluticasone (FLONASE) 50 MCG/ACT nasal spray Place 2 sprays into the nose daily as needed for allergies or rhinitis.    Past Week   furosemide (LASIX) 80 MG tablet TAKE 1 TABLET BY MOUTH TWICE A DAY (Patient taking differently: Take 40 mg by mouth 2 (two) times daily.) 60 tablet 6 06/19/2021   gabapentin (NEURONTIN) 600 MG tablet Take 600 mg by mouth daily.   06/19/2021   Insulin Glargine 300 UNIT/ML SOPN Inject 40 Units into the skin 2 (two) times daily before a meal.    06/19/2021   isosorbide mononitrate (IMDUR) 60 MG 24 hr tablet TAKE 1 TABLET BY MOUTH EVERY DAY (Patient taking differently: Take 60 mg by mouth daily.) 90 tablet 1 06/19/2021   LORazepam (ATIVAN) 1 MG  tablet Take 1 mg by mouth 2 (two) times daily.   06/19/2021   Multiple Vitamins-Minerals (MULTIVITAMIN WITH MINERALS) tablet Take 1 tablet by mouth daily.   06/19/2021   nebivolol (BYSTOLIC) 10 MG tablet TAKE 1 TABLET BY MOUTH EVERY DAY (Patient taking differently: Take 10 mg by mouth daily.) 90 tablet 0 06/19/2021 at 1100   nitroGLYCERIN (NITROSTAT) 0.4 MG SL tablet DISSOLVE 1 TABLET UNDER THE TONGUE EVERY 5 MINUTES AS NEEDED FOR CHEST PAIN. (Patient taking differently: Place 0.4 mg under the tongue every 5 (five) minutes as needed for chest pain.) 25 tablet 0 06/20/2021   pantoprazole (PROTONIX) 40 MG tablet TAKE 1 TABLET BY MOUTH EVERY DAY (Patient taking differently: Take 40 mg by mouth daily.) 90 tablet 3 06/19/2021   potassium chloride SA (KLOR-CON M20) 20 MEQ tablet TAKE 1 TABLET BY MOUTH TWICE A DAY MAY TAKE ADDITIONAL 2 TABLETS AS NEEDED/DIRECTED (Patient taking differently: Take 20 mEq by mouth 2 (two) times daily.) 360 tablet 1 06/19/2021   triamcinolone ointment (KENALOG) 0.5 % Apply to legs 2 times daily for 2 weeks (Patient taking differently: Apply 1 application topically daily as needed (For rash).) 30 g 0 Past Week   blood glucose meter kit and supplies KIT Dispense based on patient and insurance preference. Use up to four times daily as directed. (FOR ICD-9 250.00, 250.01). 1 each 0    glucose blood test strip 1 each by Other route as needed for other. Use as instructed      metolazone (ZAROXOLYN) 5 MG tablet TAKE 1 TABLET 30 MINUTES BEFORE FUROSEMIDE DAILY AS NEEDED (Patient not taking: Reported on 06/20/2021) 10 tablet 0 Not Taking   rosuvastatin (CRESTOR) 40 MG tablet Take 1 tablet (40 mg total) by mouth daily. (Patient not taking: Reported on 06/20/2021) 90 tablet 3 Not Taking   spironolactone (ALDACTONE) 25 MG tablet Take 1 tablet (25 mg total) by mouth daily. (Patient not taking: Reported on 06/20/2021) 30 tablet 6 Not Taking    Assessment: 66 YOF with chest pain concerning for ACS. Pharmacy  consulted to start IV heparin. Of note, patient is on Eliquis at home for h/o Afib. Last dose was 2/2 PM per patient.   APTT 105 is just supratherapeutic on 1000 units/hr. Tentative cath on Monday.    Goal of Therapy:  Heparin  level 0.3-0.7 units/ml aPTT 66-102 seconds Monitor platelets by anticoagulation protocol: Yes   Plan:  Decrease to heparin 900 units/hr F/u aPTT until correlates with heparin level  Monitor daily aPTT, HL, CBC, and s/s of bleeding   Thank you for including pharmacy in the care of this patient.  Benetta Spar, PharmD, BCPS, BCCP Clinical Pharmacist  Please check AMION for all [facility] phone numbers After 10:00 PM, call [place] [phone]

## 2021-06-21 NOTE — Progress Notes (Signed)
° ° °  Subjective:  Denies SSCP, palpitations or Dyspnea Constipated   Objective:  Vitals:   06/20/21 1756 06/20/21 2055 06/20/21 2348 06/21/21 0449  BP: 109/71 122/63 108/81 111/69  Pulse: 70 93 (!) 38 82  Resp: 17 15 20 20   Temp: 98.2 F (36.8 C) 98.1 F (36.7 C) 98.8 F (37.1 C) 98 F (36.7 C)  TempSrc: Oral Oral Oral Oral  SpO2: 96% 95% 94% 95%  Weight: 83.8 kg     Height: 5\' 3"  (1.6 m)       Intake/Output from previous day:  Intake/Output Summary (Last 24 hours) at 06/21/2021 1053 Last data filed at 06/21/2021 0600 Gross per 24 hour  Intake 859.05 ml  Output --  Net 859.05 ml    Physical Exam: Affect appropriate Healthy:  appears stated age HEENT: normal Neck supple with no adenopathy JVP normal no bruits no thyromegaly Lungs clear with no wheezing and good diaphragmatic motion Heart:  S1/S2 no murmur, no rub, gallop or click PMI normal Abdomen: benighn, BS positve, no tenderness, no AAA no bruit.  No HSM or HJR Distal pulses intact with no bruits No edema Neuro non-focal Skin warm and dry No muscular weakness   Lab Results: Basic Metabolic Panel: Recent Labs    06/20/21 1329 06/21/21 0414  NA 137 137  K 2.8* 3.1*  CL 95* 98  CO2 27 26  GLUCOSE 132* 122*  BUN 36* 39*  CREATININE 2.11* 2.07*  CALCIUM 10.4* 10.2   Liver Function Tests: No results for input(s): AST, ALT, ALKPHOS, BILITOT, PROT, ALBUMIN in the last 72 hours. No results for input(s): LIPASE, AMYLASE in the last 72 hours. CBC: Recent Labs    06/20/21 1329 06/21/21 0414  WBC 12.8* 14.6*  HGB 12.5 12.6  HCT 39.8 39.1  MCV 92.6 90.9  PLT 231 229    Imaging: DG Chest Portable 1 View  Result Date: 06/20/2021 CLINICAL DATA:  Right upper chest pain since this morning. EXAM: PORTABLE CHEST 1 VIEW COMPARISON:  05/04/2018 FINDINGS: Mild bilateral interstitial thickening. No focal consolidation. No pleural effusion or pneumothorax. Severe stable cardiomegaly. No acute osseous  abnormality. IMPRESSION: 1. Cardiomegaly with mild pulmonary vascular congestion. Electronically Signed   By: Kathreen Devoid M.D.   On: 06/20/2021 13:45    Cardiac Studies:  ECG: afib no acute changes    Telemetry:afib rates 80s   Echo: pending  Medications:    aspirin  324 mg Oral Once   ezetimibe  10 mg Oral Daily   gabapentin  600 mg Oral Daily   insulin glargine-yfgn  40 Units Subcutaneous BID   nebivolol  10 mg Oral Daily   pantoprazole  40 mg Oral Daily   potassium chloride  40 mEq Oral BID   rosuvastatin  40 mg Oral Daily      heparin 1,100 Units/hr (06/20/21 1721)   nitroGLYCERIN      Assessment/Plan:   Chest Pain:  history of LAD stent patent by cath in 2013 moderate RCA disease [place] troponn 71->51 no acute ECG changes Dr Hillery Hunter planned cath pending being off oral eliquis and renal function Hydrate on heparin Echo pending prior EF 50-55% tentatively put on schedule for Monday afternoon   Jenkins Rouge 06/21/2021, 10:53 AM

## 2021-06-21 NOTE — Progress Notes (Signed)
ANTICOAGULATION CONSULT NOTE - Initial Consult  Pharmacy Consult for heparin Indication: chest pain/ACS  No Known Allergies  Patient Measurements: Height: _0  (160 cm) Weight: 83.8 kg (184 lb 11.2 oz) IBW/kg (Calculated) : 52.4 Heparin Dosing Weight: 71 kg   Vital Signs: Temp: 99.4 F (37.4 C) (02/04 1118) Temp Source: Oral (02/04 1118) BP: 101/83 (02/04 1118) Pulse Rate: 85 (02/04 1118)  Labs: Recent Labs    06/20/21 1329 06/20/21 1846 06/20/21 2107 06/21/21 0414 06/21/21 1053  HGB 12.5  --   --  12.6  --   HCT 39.8  --   --  39.1  --   PLT 231  --   --  229  --   APTT  --   --   --  94* 114*  HEPARINUNFRC  --   --   --  >1.10*  --   CREATININE 2.11*  --   --  2.07*  --   TROPONINIHS 71* 58* 51*  --   --      Estimated Creatinine Clearance: 25.2 mL/min (A) (by C-G formula based on SCr of 2.07 mg/dL (H)).   Medical History: Past Medical History:  Diagnosis Date   Aortic atherosclerosis (HCC)    Arthritis    Asthma    Atrial fibrillation, permanent (HCC)    Rate control with Bystolic. CHA2DS2Vasc = 6 (HTN, DM, CHF, Age 28, Female) -> on Pradaxa   Breast cancer ([place]) 2008   S/P mastectomy   CAD S/P percutaneous coronary angioplasty 2006;    PCI of circumflex with Taxus DES;; relook-cath Feb 2013: 50-60% short lesion in RCA, 40% ISR Circumflex stent.   CKD (chronic kidney disease), stage III (HCC)    Complication of anesthesia    prolonged sedation after colonoscopy in [place]   Diabetes mellitus, uncontrolled 07/14/2011   Dilated cardiomyopathy (HCC)    Mostly Resolved (EF up from ~25% to ~45% by Echo)   DOE (dyspnea on exertion)    Edema of both legs    Usually mild, chronic. Controlled with when necessary furosemide and diet   Fibroid, uterine 07/13/11   "have that now"   GERD (gastroesophageal reflux disease)    Gout    Hyperlipidemia    Hypertension    Hypokalemia    Morbid obesity ([place])    BMI 41   OSA on CPAP    On CPAP   Pulmonary  hypertension, unspecified (HCC)    PAP ~90 mmHg on Echo 12/2017 -- has OSA on CPAP & obesity - but Overall Improved Sx of dyspnea / edema   Systolic murmur     Medications:  Medications Prior to Admission  Medication Sig Dispense Refill Last Dose   albuterol (PROVENTIL HFA;VENTOLIN HFA) 108 (90 Base) MCG/ACT inhaler Inhale 1-2 puffs into the lungs every 6 (six) hours as needed for wheezing or shortness of breath. 1 Inhaler 0 Past Month   albuterol (PROVENTIL) (2.5 MG/3ML) 0.083% nebulizer solution Take 3 mLs (2.5 mg total) by nebulization every 6 (six) hours as needed for wheezing or shortness of breath. 75 mL 12 Past Month   allopurinol (ZYLOPRIM) 300 MG tablet Take 300 mg by mouth daily.   06/19/2021   ELIQUIS 5 MG TABS tablet TAKE 1 TABLET BY MOUTH TWICE A DAY (Patient taking differently: Take 5 mg by mouth 2 (two) times daily.) 180 tablet 2 06/19/2021 at 1100   ezetimibe (ZETIA) 10 MG tablet TAKE 1 TABLET BY MOUTH EVERY DAY (Patient taking differently: Take 10 mg  by mouth daily.) 90 tablet 0 06/19/2021   ferrous sulfate 325 (65 FE) MG EC tablet Take 325 mg by mouth daily with breakfast.   06/19/2021   fexofenadine (ALLEGRA) 180 MG tablet Take 180 mg by mouth daily as needed for allergies.   3 Past Week   fluticasone (FLONASE) 50 MCG/ACT nasal spray Place 2 sprays into the nose daily as needed for allergies or rhinitis.    Past Week   furosemide (LASIX) 80 MG tablet TAKE 1 TABLET BY MOUTH TWICE A DAY (Patient taking differently: Take 40 mg by mouth 2 (two) times daily.) 60 tablet 6 06/19/2021   gabapentin (NEURONTIN) 600 MG tablet Take 600 mg by mouth daily.   06/19/2021   Insulin Glargine 300 UNIT/ML SOPN Inject 40 Units into the skin 2 (two) times daily before a meal.    06/19/2021   isosorbide mononitrate (IMDUR) 60 MG 24 hr tablet TAKE 1 TABLET BY MOUTH EVERY DAY (Patient taking differently: Take 60 mg by mouth daily.) 90 tablet 1 06/19/2021   LORazepam (ATIVAN) 1 MG tablet Take 1 mg by mouth 2 (two) times  daily.   06/19/2021   Multiple Vitamins-Minerals (MULTIVITAMIN WITH MINERALS) tablet Take 1 tablet by mouth daily.   06/19/2021   nebivolol (BYSTOLIC) 10 MG tablet TAKE 1 TABLET BY MOUTH EVERY DAY (Patient taking differently: Take 10 mg by mouth daily.) 90 tablet 0 06/19/2021 at 1100   nitroGLYCERIN (NITROSTAT) 0.4 MG SL tablet DISSOLVE 1 TABLET UNDER THE TONGUE EVERY 5 MINUTES AS NEEDED FOR CHEST PAIN. (Patient taking differently: Place 0.4 mg under the tongue every 5 (five) minutes as needed for chest pain.) 25 tablet 0 06/20/2021   pantoprazole (PROTONIX) 40 MG tablet TAKE 1 TABLET BY MOUTH EVERY DAY (Patient taking differently: Take 40 mg by mouth daily.) 90 tablet 3 06/19/2021   potassium chloride SA (KLOR-CON M20) 20 MEQ tablet TAKE 1 TABLET BY MOUTH TWICE A DAY MAY TAKE ADDITIONAL 2 TABLETS AS NEEDED/DIRECTED (Patient taking differently: Take 20 mEq by mouth 2 (two) times daily.) 360 tablet 1 06/19/2021   triamcinolone ointment (KENALOG) 0.5 % Apply to legs 2 times daily for 2 weeks (Patient taking differently: Apply 1 application topically daily as needed (For rash).) 30 g 0 Past Week   blood glucose meter kit and supplies KIT Dispense based on patient and insurance preference. Use up to four times daily as directed. (FOR ICD-9 250.00, 250.01). 1 each 0    glucose blood test strip 1 each by Other route as needed for other. Use as instructed      metolazone (ZAROXOLYN) 5 MG tablet TAKE 1 TABLET 30 MINUTES BEFORE FUROSEMIDE DAILY AS NEEDED (Patient not taking: Reported on 06/20/2021) 10 tablet 0 Not Taking   rosuvastatin (CRESTOR) 40 MG tablet Take 1 tablet (40 mg total) by mouth daily. (Patient not taking: Reported on 06/20/2021) 90 tablet 3 Not Taking   spironolactone (ALDACTONE) 25 MG tablet Take 1 tablet (25 mg total) by mouth daily. (Patient not taking: Reported on 06/20/2021) 30 tablet 6 Not Taking    Assessment: 71 YOF with chest pain concerning for ACS. Pharmacy consulted to start IV heparin. Of note,  patient is on Eliquis at home for h/o Afib. Last dose was 2/2 PM per patient.   Most recent aPTT supratherapeutic at 114. CBC stable. No issues noted per chart review.  Goal of Therapy:  Heparin level 0.3-0.7 units/ml aPTT 66-102 seconds Monitor platelets by anticoagulation protocol: Yes   Plan:  -Decrease to  heparin 1000 units/hr -F/u 8 hr aPTT -Monitor daily aPTT, HL, CBC, and s/s of bleeding   Thank you for including pharmacy in the care of this patient.  Zenaida Deed, PharmD PGY1 Acute Care Pharmacy Resident  Phone: [phone] 06/21/2021  12:21 PM  Please check [email or web address] for unit-specific pharmacy phone numbers.

## 2021-06-21 NOTE — Progress Notes (Signed)
ANTICOAGULATION CONSULT NOTE - Follow Up Consult  Pharmacy Consult for heparin Indication:  Afib/ACS  Labs: Recent Labs    06/20/21 1329 06/20/21 1846 06/20/21 2107 06/21/21 0414  HGB 12.5  --   --  12.6  HCT 39.8  --   --  39.1  PLT 231  --   --  229  APTT  --   --   --  94*  HEPARINUNFRC  --   --   --  >1.10*  CREATININE 2.11*  --   --  2.07*  TROPONINIHS 71* 58* 51*  --     Assessment/Plan:  71yo female therapeutic on heparin with initial dosing while Eliquis on hold. Will continue infusion at current rate of 1100 units/hr and confirm stable with additional PTT.   Rogue Bussing 06/21/2021,5:07 AM

## 2021-06-21 NOTE — Plan of Care (Signed)

## 2021-06-22 ENCOUNTER — Other Ambulatory Visit: Payer: Self-pay | Admitting: Cardiology

## 2021-06-22 DIAGNOSIS — I255 Ischemic cardiomyopathy: Secondary | ICD-10-CM

## 2021-06-22 DIAGNOSIS — I214 Non-ST elevation (NSTEMI) myocardial infarction: Secondary | ICD-10-CM | POA: Diagnosis not present

## 2021-06-22 LAB — BASIC METABOLIC PANEL
Anion gap: 9 (ref 5–15)
BUN: 28 mg/dL — ABNORMAL HIGH (ref 8–23)
CO2: 24 mmol/L (ref 22–32)
Calcium: 9.9 mg/dL (ref 8.9–10.3)
Chloride: 105 mmol/L (ref 98–111)
Creatinine, Ser: 1.59 mg/dL — ABNORMAL HIGH (ref 0.44–1.00)
GFR, Estimated: 34 mL/min — ABNORMAL LOW (ref >60)
Glucose, Bld: 77 mg/dL (ref 70–99)
Potassium: 3.5 mmol/L (ref 3.5–5.1)
Sodium: 138 mmol/L (ref 135–145)

## 2021-06-22 LAB — CBC
HCT: 38 % (ref 36.0–46.0)
HCT: 36.9 % (ref 36.0–46.0)
Hemoglobin: 12.1 g/dL (ref 12.0–15.0)
Hemoglobin: 11.7 g/dL — ABNORMAL LOW (ref 12.0–15.0)
MCH: 29.5 pg (ref 26.0–34.0)
MCH: 29 pg (ref 26.0–34.0)
MCHC: 31.8 g/dL (ref 30.0–36.0)
MCHC: 31.7 g/dL (ref 30.0–36.0)
MCV: 92.7 fL (ref 80.0–100.0)
MCV: 91.3 fL (ref 80.0–100.0)
Platelets: 178 10*3/uL (ref 150–400)
Platelets: 199 10*3/uL (ref 150–400)
RBC: 4.1 MIL/uL (ref 3.87–5.11)
RBC: 4.04 MIL/uL (ref 3.87–5.11)
RDW: 16.8 % — ABNORMAL HIGH (ref 11.5–15.5)
RDW: 16.8 % — ABNORMAL HIGH (ref 11.5–15.5)
WBC: 10 10*3/uL (ref 4.0–10.5)
WBC: 11.2 10*3/uL — ABNORMAL HIGH (ref 4.0–10.5)
nRBC: 0 % (ref 0.0–0.2)
nRBC: 0 % (ref 0.0–0.2)

## 2021-06-22 LAB — GLUCOSE, CAPILLARY
Glucose-Capillary: 75 mg/dL (ref 70–99)
Glucose-Capillary: 92 mg/dL (ref 70–99)
Glucose-Capillary: 186 mg/dL — ABNORMAL HIGH (ref 70–99)

## 2021-06-22 LAB — APTT
aPTT: 76 seconds — ABNORMAL HIGH (ref 24–36)
aPTT: 60 seconds — ABNORMAL HIGH (ref 24–36)
aPTT: 66 seconds — ABNORMAL HIGH (ref 24–36)

## 2021-06-22 LAB — HEPARIN LEVEL (UNFRACTIONATED): Heparin Unfractionated: >1.1 IU/mL — ABNORMAL HIGH (ref 0.30–0.70)

## 2021-06-22 MED ORDER — ASPIRIN 81 MG PO CHEW
81.0000 mg | CHEWABLE_TABLET | ORAL | Status: AC
  Administered 2021-06-23: 81 mg via ORAL
  Filled 2021-06-22: qty 1

## 2021-06-22 MED ORDER — SODIUM CHLORIDE 0.9 % IV SOLN: INTRAVENOUS | Status: DC

## 2021-06-22 MED ORDER — SODIUM CHLORIDE 0.9% FLUSH
3.0000 mL | Freq: Two times a day (BID) | INTRAVENOUS | Status: DC
  Administered 2021-06-23: 3 mL via INTRAVENOUS

## 2021-06-22 MED ORDER — SODIUM CHLORIDE 0.9 % IV SOLN: 250.0000 mL | INTRAVENOUS | Status: DC | PRN

## 2021-06-22 MED ORDER — SODIUM CHLORIDE 0.9% FLUSH: 3.0000 mL | INTRAVENOUS | Status: DC | PRN

## 2021-06-22 NOTE — Progress Notes (Addendum)
Primary cardiologist:  Ellyn Hack  Subjective:  Denies SSCP, palpitations or Dyspnea   Objective:  Vitals:   06/21/21 0449 06/21/21 1118 06/21/21 2004 06/22/21 0516  BP: 111/69 101/83 (!) 109/55 112/66  Pulse: 82 85 76 76  Resp: 20 (!) 24    Temp: 98 F (36.7 C) 99.4 F (37.4 C) 99.2 F (37.3 C) (!) 97.2 F (36.2 C)  TempSrc: Oral Oral Oral Axillary  SpO2: 95% 93% 94% 98%  Weight:      Height:        Intake/Output from previous day:  Intake/Output Summary (Last 24 hours) at 06/22/2021 0272 Last data filed at 06/21/2021 1625 Gross per 24 hour  Intake 559.4 ml  Output --  Net 559.4 ml    Physical Exam: Affect appropriate Healthy:  appears stated age HEENT: normal Neck supple with no adenopathy JVP normal no bruits no thyromegaly Lungs clear with no wheezing and good diaphragmatic motion Heart:  S1/S2 no murmur, no rub, gallop or click PMI normal Abdomen: benighn, BS positve, no tenderness, no AAA no bruit.  No HSM or HJR Distal pulses intact with no bruits No edema Neuro non-focal Skin warm and dry No muscular weakness   Lab Results: Basic Metabolic Panel: Recent Labs    06/20/21 1329 06/21/21 0414  NA 137 137  K 2.8* 3.1*  CL 95* 98  CO2 27 26  GLUCOSE 132* 122*  BUN 36* 39*  CREATININE 2.11* 2.07*  CALCIUM 10.4* 10.2   Liver Function Tests: No results for input(s): AST, ALT, ALKPHOS, BILITOT, PROT, ALBUMIN in the last 72 hours. No results for input(s): LIPASE, AMYLASE in the last 72 hours. CBC: Recent Labs    06/21/21 0414 06/22/21 0259  WBC 14.6* 10.0  HGB 12.6 12.1  HCT 39.1 38.0  MCV 90.9 92.7  PLT 229 178    Imaging: DG Chest Portable 1 View  Result Date: 06/20/2021 CLINICAL DATA:  Right upper chest pain since this morning. EXAM: PORTABLE CHEST 1 VIEW COMPARISON:  05/04/2018 FINDINGS: Mild bilateral interstitial thickening. No focal consolidation. No pleural effusion or pneumothorax. Severe stable cardiomegaly. No acute osseous  abnormality. IMPRESSION: 1. Cardiomegaly with mild pulmonary vascular congestion. Electronically Signed   By: Kathreen Devoid M.D.   On: 06/20/2021 13:45   ECHOCARDIOGRAM COMPLETE  Result Date: 06/21/2021    ECHOCARDIOGRAM REPORT   Patient Name:   Stephanie Franco Date of Exam: 06/21/2021 Medical Rec #:  [id number]         Height:       63.0 in Accession #:    [id number]        Weight:       184.7 lb Date of Birth:  1951/02/19         BSA:          1.869 m Patient Age:    71 years          BP:           101/83 mmHg Patient Gender: F                 HR:           89 bpm. Exam Location:  Inpatient Procedure: 2D Echo, Cardiac Doppler and Color Doppler Indications:    NSTEMI I21.4  History:        Patient has prior history of Echocardiogram examinations. CHF,                 Pulmonary HTN, Arrythmias:Atrial Fibrillation;  Risk                 Factors:Sleep Apnea, Diabetes, Hypertension and Dyslipidemia.                 Breast cancer 20 years ago in remission not on chemotherapy.  Sonographer:    Merrie Roof RDCS Referring Phys: [id number] [place]  1. Left ventricular ejection fraction, by estimation, is 25 to 30%. The left ventricle has severely decreased function. The left ventricle demonstrates regional wall motion abnormalities (see scoring diagram/findings for description). Left ventricular diastolic parameters are indeterminate.  2. Right ventricular systolic function is moderately reduced. The right ventricular size is normal. There is mildly elevated pulmonary artery systolic pressure. The estimated right ventricular systolic pressure is 76.1 mmHg.  3. Left atrial size was severely dilated.  4. Right atrial size was moderately dilated.  5. The mitral valve is abnormal. Restricted posterior leaflet. Moderate mitral valve regurgitation, eccentric and posteriorly directed.  6. The aortic valve is tricuspid. Aortic valve regurgitation is not visualized. No aortic stenosis is present. Aortic valve mean gradient  measures 4.0 mmHg.  7. The inferior vena cava is dilated in size with >50% respiratory variability, suggesting right atrial pressure of 8 mmHg. Comparison(s): Prior images reviewed side by side. LVEF has decreased in comparison to prior study, now 25-30% range. Moderate, posteriorly directed mitral regurgitation. FINDINGS  Left Ventricle: Left ventricular ejection fraction, by estimation, is 25 to 30%. The left ventricle has severely decreased function. The left ventricle demonstrates regional wall motion abnormalities. The left ventricular internal cavity size was normal  in size. There is borderline left ventricular hypertrophy. Abnormal (paradoxical) septal motion, consistent with left bundle branch block. Left ventricular diastolic function could not be evaluated due to atrial fibrillation. Left ventricular diastolic parameters are indeterminate.  LV Wall Scoring: The basal inferior segment is akinetic. The inferior septum, mid and distal inferior wall, posterior wall, basal anterolateral segment, and apex are hypokinetic. The entire anterior wall, anterior septum, apical lateral segment, and mid anterolateral segment are normal. Right Ventricle: The right ventricular size is normal. No increase in right ventricular wall thickness. Right ventricular systolic function is moderately reduced. There is mildly elevated pulmonary artery systolic pressure. The tricuspid regurgitant velocity is 2.98 m/s, and with an assumed right atrial pressure of 8 mmHg, the estimated right ventricular systolic pressure is 60.7 mmHg. Left Atrium: Left atrial size was severely dilated. Right Atrium: Right atrial size was moderately dilated. Pericardium: There is no evidence of pericardial effusion. Mitral Valve: The mitral valve is abnormal. Moderate mitral valve regurgitation, with posteriorly-directed jet. Tricuspid Valve: The tricuspid valve is grossly normal. Tricuspid valve regurgitation is mild. Aortic Valve: The aortic valve is  tricuspid. Aortic valve regurgitation is not visualized. No aortic stenosis is present. Aortic valve mean gradient measures 4.0 mmHg. Aortic valve peak gradient measures 7.1 mmHg. Aortic valve area, by VTI measures 1.47 cm. Pulmonic Valve: The pulmonic valve was grossly normal. Pulmonic valve regurgitation is trivial. Aorta: The aortic root is normal in size and structure. Venous: The inferior vena cava is dilated in size with greater than 50% respiratory variability, suggesting right atrial pressure of 8 mmHg. IAS/Shunts: No atrial level shunt detected by color flow Doppler.  LEFT VENTRICLE PLAX 2D LVIDd:         5.00 cm LVIDs:         4.40 cm LV PW:         1.20 cm LV IVS:  1.00 cm LVOT diam:     1.90 cm LV SV:         37 LV SV Index:   20 LVOT Area:     2.84 cm  RIGHT VENTRICLE            IVC RV Basal diam:  4.60 cm    IVC diam: 2.60 cm RV Mid diam:    3.50 cm RV S prime:     9.57 cm/s TAPSE (M-mode): 1.7 cm LEFT ATRIUM              Index        RIGHT ATRIUM           Index LA diam:        5.60 cm  3.00 cm/m   RA Area:     26.40 cm LA Vol (A2C):   124.0 ml 66.33 ml/m  RA Volume:   89.70 ml  47.99 ml/m LA Vol (A4C):   100.0 ml 53.50 ml/m LA Biplane Vol: 117.0 ml 62.59 ml/m  AORTIC VALVE AV Area (Vmax):    1.58 cm AV Area (Vmean):   1.42 cm AV Area (VTI):     1.47 cm AV Vmax:           133.00 cm/s AV Vmean:          88.500 cm/s AV VTI:            0.248 m AV Peak Grad:      7.1 mmHg AV Mean Grad:      4.0 mmHg LVOT Vmax:         73.90 cm/s LVOT Vmean:        44.200 cm/s LVOT VTI:          0.129 m LVOT/AV VTI ratio: 0.52  AORTA Ao Root diam: 2.90 cm Ao Asc diam:  3.30 cm MR Peak grad:    84.3 mmHg    TRICUSPID VALVE MR Mean grad:    49.0 mmHg    TR Peak grad:   35.5 mmHg MR Vmax:         459.00 cm/s  TR Vmax:        298.00 cm/s MR Vmean:        321.0 cm/s MR PISA:         2.26 cm     SHUNTS MR PISA Eff ROA: 16 mm       Systemic VTI:  0.13 m MR PISA Radius:  0.60 cm      Systemic Diam: 1.90 cm  Rozann Lesches MD Electronically signed by Rozann Lesches MD Signature Date/Time: 06/21/2021/3:27:27 PM    Final     Cardiac Studies:  ECG: afib no acute changes    Telemetry:afib rates 80s   Echo:  EF 25-30% significant drop since TTE 01/26/19 with moderate MR   Medications:    aspirin  324 mg Oral Once   [START ON 06/23/2021] aspirin  81 mg Oral Pre-Cath   aspirin EC  81 mg Oral Daily   ezetimibe  10 mg Oral Daily   gabapentin  600 mg Oral Daily   insulin glargine-yfgn  40 Units Subcutaneous BID   nebivolol  10 mg Oral Daily   pantoprazole  40 mg Oral Daily   rosuvastatin  40 mg Oral Daily   sodium chloride flush  3 mL Intravenous Q12H      sodium chloride 100 mL/hr at 06/21/21 1137   sodium chloride     [START ON 06/23/2021]  sodium chloride     heparin 900 Units/hr (06/21/21 2222)   nitroGLYCERIN      Assessment/Plan:   Chest Pain:  history of LAD stent patent by cath in 2013 moderate RCA disease Flat troponn 71->51 no acute ECG changes Dr Hillery Hunter planned cath pending being off oral eliquis and renal function Gentle hydration with newly decreased EF unable to Rx ARB/ARNI/ACE due to Cr and need for cath would proceed with cath so long as Cr around 2 minimal contrast with right heart and no LV gram Depending on Coox and renal function post cath she may need milrinone and CHF consult Hold lasix continue beta blocker ASA and statin  Afib:  eliquis held on heparin rate control all right   Stephanie Franco 06/22/2021, 9:38 AM

## 2021-06-22 NOTE — Progress Notes (Signed)
ANTICOAGULATION CONSULT NOTE - Initial Consult  Pharmacy Consult for heparin Indication: chest pain/ACS  No Known Allergies  Patient Measurements: Height: 5' 3"  (160 cm) Weight: 83.8 kg (184 lb 11.2 oz) IBW/kg (Calculated) : 52.4 Heparin Dosing Weight: 71 kg   Vital Signs: Temp: 98.1 F (36.7 C) (02/05 1714) Temp Source: Oral (02/05 1714) BP: 118/67 (02/05 1714) Pulse Rate: 72 (02/05 1714)  Labs: Recent Labs    06/20/21 1329 06/20/21 1846 06/20/21 2107 06/21/21 0414 06/21/21 1053 06/22/21 0259 06/22/21 0941 06/22/21 2015  HGB 12.5  --   --  12.6  --  12.1 11.7*  --   HCT 39.8  --   --  39.1  --  38.0 36.9  --   PLT 231  --   --  229  --  178 199  --   APTT  --   --   --  94*   < > 76* 60* 66*  HEPARINUNFRC  --   --   --  >1.10*  --  >1.10*  --   --   CREATININE 2.11*  --   --  2.07*  --   --  1.59*  --   TROPONINIHS 71* 58* 51*  --   --   --   --   --    < > = values in this interval not displayed.    Estimated Creatinine Clearance: 32.8 mL/min (A) (by C-G formula based on SCr of 1.59 mg/dL (H)).   Medical History: Past Medical History:  Diagnosis Date   Aortic atherosclerosis (HCC)    Arthritis    Asthma    Atrial fibrillation, permanent (HCC)    Rate control with Bystolic. CHA2DS2Vasc = 6 (HTN, DM, CHF, Age 32, Female) -> on Pradaxa   Breast cancer ([place]) 2008   S/P mastectomy   CAD S/P percutaneous coronary angioplasty 2006;    PCI of circumflex with Taxus DES;; relook-cath Feb 2013: 50-60% short lesion in RCA, 40% ISR Circumflex stent.   CKD (chronic kidney disease), stage III (HCC)    Complication of anesthesia    prolonged sedation after colonoscopy in [place]   Diabetes mellitus, uncontrolled 07/14/2011   Dilated cardiomyopathy (HCC)    Mostly Resolved (EF up from ~25% to ~45% by Echo)   DOE (dyspnea on exertion)    Edema of both legs    Usually mild, chronic. Controlled with when necessary furosemide and diet   Fibroid, uterine 07/13/11   "have that  now"   GERD (gastroesophageal reflux disease)    Gout    Hyperlipidemia    Hypertension    Hypokalemia    Morbid obesity ([place])    BMI 41   OSA on CPAP    On CPAP   Pulmonary hypertension, unspecified (HCC)    PAP ~90 mmHg on Echo 12/2017 -- has OSA on CPAP & obesity - but Overall Improved Sx of dyspnea / edema   Systolic murmur     Medications:  Medications Prior to Admission  Medication Sig Dispense Refill Last Dose   albuterol (PROVENTIL HFA;VENTOLIN HFA) 108 (90 Base) MCG/ACT inhaler Inhale 1-2 puffs into the lungs every 6 (six) hours as needed for wheezing or shortness of breath. 1 Inhaler 0 Past Month   albuterol (PROVENTIL) (2.5 MG/3ML) 0.083% nebulizer solution Take 3 mLs (2.5 mg total) by nebulization every 6 (six) hours as needed for wheezing or shortness of breath. 75 mL 12 Past Month   allopurinol (ZYLOPRIM) 300 MG tablet Take 300  mg by mouth daily.   06/19/2021   ELIQUIS 5 MG TABS tablet TAKE 1 TABLET BY MOUTH TWICE A DAY (Patient taking differently: Take 5 mg by mouth 2 (two) times daily.) 180 tablet 2 06/19/2021 at 1100   ezetimibe (ZETIA) 10 MG tablet TAKE 1 TABLET BY MOUTH EVERY DAY (Patient taking differently: Take 10 mg by mouth daily.) 90 tablet 0 06/19/2021   ferrous sulfate 325 (65 FE) MG EC tablet Take 325 mg by mouth daily with breakfast.   06/19/2021   fexofenadine (ALLEGRA) 180 MG tablet Take 180 mg by mouth daily as needed for allergies.   3 Past Week   fluticasone (FLONASE) 50 MCG/ACT nasal spray Place 2 sprays into the nose daily as needed for allergies or rhinitis.    Past Week   furosemide (LASIX) 80 MG tablet TAKE 1 TABLET BY MOUTH TWICE A DAY (Patient taking differently: Take 40 mg by mouth 2 (two) times daily.) 60 tablet 6 06/19/2021   gabapentin (NEURONTIN) 600 MG tablet Take 600 mg by mouth daily.   06/19/2021   Insulin Glargine 300 UNIT/ML SOPN Inject 40 Units into the skin 2 (two) times daily before a meal.    06/19/2021   isosorbide mononitrate (IMDUR) 60 MG 24 hr  tablet TAKE 1 TABLET BY MOUTH EVERY DAY (Patient taking differently: Take 60 mg by mouth daily.) 90 tablet 1 06/19/2021   LORazepam (ATIVAN) 1 MG tablet Take 1 mg by mouth 2 (two) times daily.   06/19/2021   Multiple Vitamins-Minerals (MULTIVITAMIN WITH MINERALS) tablet Take 1 tablet by mouth daily.   06/19/2021   nebivolol (BYSTOLIC) 10 MG tablet TAKE 1 TABLET BY MOUTH EVERY DAY (Patient taking differently: Take 10 mg by mouth daily.) 90 tablet 0 06/19/2021 at 1100   nitroGLYCERIN (NITROSTAT) 0.4 MG SL tablet DISSOLVE 1 TABLET UNDER THE TONGUE EVERY 5 MINUTES AS NEEDED FOR CHEST PAIN. (Patient taking differently: Place 0.4 mg under the tongue every 5 (five) minutes as needed for chest pain.) 25 tablet 0 06/20/2021   pantoprazole (PROTONIX) 40 MG tablet TAKE 1 TABLET BY MOUTH EVERY DAY (Patient taking differently: Take 40 mg by mouth daily.) 90 tablet 3 06/19/2021   potassium chloride SA (KLOR-CON M20) 20 MEQ tablet TAKE 1 TABLET BY MOUTH TWICE A DAY MAY TAKE ADDITIONAL 2 TABLETS AS NEEDED/DIRECTED (Patient taking differently: Take 20 mEq by mouth 2 (two) times daily.) 360 tablet 1 06/19/2021   triamcinolone ointment (KENALOG) 0.5 % Apply to legs 2 times daily for 2 weeks (Patient taking differently: Apply 1 application topically daily as needed (For rash).) 30 g 0 Past Week   blood glucose meter kit and supplies KIT Dispense based on patient and insurance preference. Use up to four times daily as directed. (FOR ICD-9 250.00, 250.01). 1 each 0    glucose blood test strip 1 each by Other route as needed for other. Use as instructed      metolazone (ZAROXOLYN) 5 MG tablet TAKE 1 TABLET 30 MINUTES BEFORE FUROSEMIDE DAILY AS NEEDED (Patient not taking: Reported on 06/20/2021) 10 tablet 0 Not Taking   rosuvastatin (CRESTOR) 40 MG tablet Take 1 tablet (40 mg total) by mouth daily. (Patient not taking: Reported on 06/20/2021) 90 tablet 3 Not Taking   spironolactone (ALDACTONE) 25 MG tablet Take 1 tablet (25 mg total) by mouth  daily. (Patient not taking: Reported on 06/20/2021) 30 tablet 6 Not Taking    Assessment: 71 YOF with chest pain concerning for ACS. Pharmacy consulted to start  IV heparin. Of note, patient is on Eliquis at home for h/o Afib. Last dose was 2/2 PM per patient.   APTT 66 is at low end of therapeutic on 1000 units/hr. No issues with infusion or bleeding per RN.  Goal of Therapy:  Heparin level 0.3-0.7 units/ml aPTT 66-102 seconds Monitor platelets by anticoagulation protocol: Yes   Plan:  Increase heparin to 1050 units/hr F/u aPTT until correlates with heparin level  Monitor daily aPTT, HL, CBC, and s/s of bleeding   Thank you for including pharmacy in the care of this patient.  Benetta Spar, PharmD, BCPS, BCCP Clinical Pharmacist  Please check AMION for all [place] phone numbers After 10:00 PM, call [place] [phone]

## 2021-06-22 NOTE — TOC Progression Note (Signed)
Transition of Care [facility]) - Progression Note    Patient Details  Name: Stephanie Franco MRN: [id number] Date of Birth: [birth year]  Transition of Care [facility]) CM/SW Contact  Zenon Mayo, RN Phone Number: 06/22/2021, 8:58 AM  Clinical Narrative:     Transition of Care [facility]) Screening Note   Patient Details  Name: Stephanie Franco Date of Birth: 05/20/1950   Transition of Care [facility]) CM/SW Contact:    Zenon Mayo, RN Phone Number: 06/22/2021, 8:58 AM    Transition of Care Department [facility]) has reviewed patient and no TOC needs have been identified at this time. We will continue to monitor patient advancement through interdisciplinary progression rounds. If new patient transition needs arise, please place a TOC consult.          Expected Discharge Plan and Services                                                 Social Determinants of Health (SDOH) Interventions    Readmission Risk Interventions No flowsheet data found.

## 2021-06-22 NOTE — Progress Notes (Addendum)
ANTICOAGULATION CONSULT NOTE - Initial Consult  Pharmacy Consult for heparin Indication: chest pain/ACS  No Known Allergies  Patient Measurements: Height: 5' 3"  (160 cm) Weight: 83.8 kg (184 lb 11.2 oz) IBW/kg (Calculated) : 52.4 Heparin Dosing Weight: 71 kg   Vital Signs: Temp: 97.2 F (36.2 C) (02/05 0516) Temp Source: Axillary (02/05 0516) BP: 112/66 (02/05 0516) Pulse Rate: 76 (02/05 0516)  Labs: Recent Labs    06/20/21 1329 06/20/21 1329 06/20/21 1846 06/20/21 2107 06/21/21 0414 06/21/21 1053 06/21/21 2055 06/22/21 0259  HGB 12.5  --   --   --  12.6  --   --  12.1  HCT 39.8  --   --   --  39.1  --   --  38.0  PLT 231  --   --   --  229  --   --  178  APTT  --    < >  --   --  94* 114* 105* 76*  HEPARINUNFRC  --   --   --   --  >1.10*  --   --  >1.10*  CREATININE 2.11*  --   --   --  2.07*  --   --   --   TROPONINIHS 71*  --  58* 51*  --   --   --   --    < > = values in this interval not displayed.     Estimated Creatinine Clearance: 25.2 mL/min (A) (by C-G formula based on SCr of 2.07 mg/dL (H)).   Medical History: Past Medical History:  Diagnosis Date   Aortic atherosclerosis (HCC)    Arthritis    Asthma    Atrial fibrillation, permanent (HCC)    Rate control with Bystolic. CHA2DS2Vasc = 6 (HTN, DM, CHF, Age 71, Female) -> on Pradaxa   Breast cancer ([place]) 2008   S/P mastectomy   CAD S/P percutaneous coronary angioplasty 2006;    PCI of circumflex with Taxus DES;; relook-cath Feb 2013: 50-60% short lesion in RCA, 40% ISR Circumflex stent.   CKD (chronic kidney disease), stage III (HCC)    Complication of anesthesia    prolonged sedation after colonoscopy in [place]   Diabetes mellitus, uncontrolled 07/14/2011   Dilated cardiomyopathy (HCC)    Mostly Resolved (EF up from ~25% to ~45% by Echo)   DOE (dyspnea on exertion)    Edema of both legs    Usually mild, chronic. Controlled with when necessary furosemide and diet   Fibroid, uterine 07/13/11    "have that now"   GERD (gastroesophageal reflux disease)    Gout    Hyperlipidemia    Hypertension    Hypokalemia    Morbid obesity ([place])    BMI 41   OSA on CPAP    On CPAP   Pulmonary hypertension, unspecified (HCC)    PAP ~90 mmHg on Echo 12/2017 -- has OSA on CPAP & obesity - but Overall Improved Sx of dyspnea / edema   Systolic murmur     Medications:  Medications Prior to Admission  Medication Sig Dispense Refill Last Dose   albuterol (PROVENTIL HFA;VENTOLIN HFA) 108 (90 Base) MCG/ACT inhaler Inhale 1-2 puffs into the lungs every 6 (six) hours as needed for wheezing or shortness of breath. 1 Inhaler 0 Past Month   albuterol (PROVENTIL) (2.5 MG/3ML) 0.083% nebulizer solution Take 3 mLs (2.5 mg total) by nebulization every 6 (six) hours as needed for wheezing or shortness of breath. 75 mL 12 Past Month  allopurinol (ZYLOPRIM) 300 MG tablet Take 300 mg by mouth daily.   06/19/2021   ELIQUIS 5 MG TABS tablet TAKE 1 TABLET BY MOUTH TWICE A DAY (Patient taking differently: Take 5 mg by mouth 2 (two) times daily.) 180 tablet 2 06/19/2021 at 1100   ezetimibe (ZETIA) 10 MG tablet TAKE 1 TABLET BY MOUTH EVERY DAY (Patient taking differently: Take 10 mg by mouth daily.) 90 tablet 0 06/19/2021   ferrous sulfate 325 (65 FE) MG EC tablet Take 325 mg by mouth daily with breakfast.   06/19/2021   fexofenadine (ALLEGRA) 180 MG tablet Take 180 mg by mouth daily as needed for allergies.   3 Past Week   fluticasone (FLONASE) 50 MCG/ACT nasal spray Place 2 sprays into the nose daily as needed for allergies or rhinitis.    Past Week   furosemide (LASIX) 80 MG tablet TAKE 1 TABLET BY MOUTH TWICE A DAY (Patient taking differently: Take 40 mg by mouth 2 (two) times daily.) 60 tablet 6 06/19/2021   gabapentin (NEURONTIN) 600 MG tablet Take 600 mg by mouth daily.   06/19/2021   Insulin Glargine 300 UNIT/ML SOPN Inject 40 Units into the skin 2 (two) times daily before a meal.    06/19/2021   isosorbide mononitrate (IMDUR)  60 MG 24 hr tablet TAKE 1 TABLET BY MOUTH EVERY DAY (Patient taking differently: Take 60 mg by mouth daily.) 90 tablet 1 06/19/2021   LORazepam (ATIVAN) 1 MG tablet Take 1 mg by mouth 2 (two) times daily.   06/19/2021   Multiple Vitamins-Minerals (MULTIVITAMIN WITH MINERALS) tablet Take 1 tablet by mouth daily.   06/19/2021   nebivolol (BYSTOLIC) 10 MG tablet TAKE 1 TABLET BY MOUTH EVERY DAY (Patient taking differently: Take 10 mg by mouth daily.) 90 tablet 0 06/19/2021 at 1100   nitroGLYCERIN (NITROSTAT) 0.4 MG SL tablet DISSOLVE 1 TABLET UNDER THE TONGUE EVERY 5 MINUTES AS NEEDED FOR CHEST PAIN. (Patient taking differently: Place 0.4 mg under the tongue every 5 (five) minutes as needed for chest pain.) 25 tablet 0 06/20/2021   pantoprazole (PROTONIX) 40 MG tablet TAKE 1 TABLET BY MOUTH EVERY DAY (Patient taking differently: Take 40 mg by mouth daily.) 90 tablet 3 06/19/2021   potassium chloride SA (KLOR-CON M20) 20 MEQ tablet TAKE 1 TABLET BY MOUTH TWICE A DAY MAY TAKE ADDITIONAL 2 TABLETS AS NEEDED/DIRECTED (Patient taking differently: Take 20 mEq by mouth 2 (two) times daily.) 360 tablet 1 06/19/2021   triamcinolone ointment (KENALOG) 0.5 % Apply to legs 2 times daily for 2 weeks (Patient taking differently: Apply 1 application topically daily as needed (For rash).) 30 g 0 Past Week   blood glucose meter kit and supplies KIT Dispense based on patient and insurance preference. Use up to four times daily as directed. (FOR ICD-9 250.00, 250.01). 1 each 0    glucose blood test strip 1 each by Other route as needed for other. Use as instructed      metolazone (ZAROXOLYN) 5 MG tablet TAKE 1 TABLET 30 MINUTES BEFORE FUROSEMIDE DAILY AS NEEDED (Patient not taking: Reported on 06/20/2021) 10 tablet 0 Not Taking   rosuvastatin (CRESTOR) 40 MG tablet Take 1 tablet (40 mg total) by mouth daily. (Patient not taking: Reported on 06/20/2021) 90 tablet 3 Not Taking   spironolactone (ALDACTONE) 25 MG tablet Take 1 tablet (25 mg total)  by mouth daily. (Patient not taking: Reported on 06/20/2021) 30 tablet 6 Not Taking    Assessment: 71 YOF with chest pain  concerning for ACS. Pharmacy consulted to start IV heparin. Of note, patient is on Eliquis at home for h/o Afib. Last dose was 2/2 PM per patient.   Most recent aPTT therapeutic at 76; heparin level still high at >1.1 due to Eliquis use and not yet correlating. Hemoglobin stable; platelets down slightly at 178 from 229. No issues noted per chart review.  Goal of Therapy:  Heparin level 0.3-0.7 units/ml aPTT 66-102 seconds Monitor platelets by anticoagulation protocol: Yes   Plan:  Continue heparin 900 units/hr Confirmatory aPTT in 6h  Monitor daily aPTT, HL, CBC, and s/s of bleeding   Thank you for including pharmacy in the care of this patient.  Zenaida Deed, PharmD PGY1 Acute Care Pharmacy Resident  Phone: [phone] 06/22/2021  7:43 AM  --------------------------------------------------------------------------------------------  Addendum: Confirmatory aPTT subtherapeutic at 60.   Plan:  Increase to heparin 1000 units/hr aPTT in ~8h Monitor daily aPTT, HL, CBC, and s/s of bleeding   Zenaida Deed, PharmD PGY1 Acute Care Pharmacy Resident  Phone: [phone] 06/22/2021  10:45 AM  Please check [email or web address] for unit-specific pharmacy phone numbers.

## 2021-06-23 ENCOUNTER — Encounter (HOSPITAL_COMMUNITY): Payer: Self-pay | Admitting: Cardiovascular Disease

## 2021-06-23 ENCOUNTER — Inpatient Hospital Stay (HOSPITAL_COMMUNITY): Payer: Medicare Other

## 2021-06-23 ENCOUNTER — Inpatient Hospital Stay (HOSPITAL_COMMUNITY)
Admission: EM | Disposition: A | Discharge status: Home / Self Care | Payer: Self-pay | Source: Home / Self Care | Attending: Cardiology

## 2021-06-23 ENCOUNTER — Other Ambulatory Visit (HOSPITAL_COMMUNITY): Payer: Self-pay

## 2021-06-23 ENCOUNTER — Inpatient Hospital Stay: Payer: Self-pay

## 2021-06-23 DIAGNOSIS — I2510 Atherosclerotic heart disease of native coronary artery without angina pectoris: Secondary | ICD-10-CM

## 2021-06-23 DIAGNOSIS — I5022 Chronic systolic (congestive) heart failure: Secondary | ICD-10-CM | POA: Diagnosis not present

## 2021-06-23 DIAGNOSIS — I214 Non-ST elevation (NSTEMI) myocardial infarction: Secondary | ICD-10-CM | POA: Diagnosis not present

## 2021-06-23 DIAGNOSIS — R778 Other specified abnormalities of plasma proteins: Secondary | ICD-10-CM

## 2021-06-23 DIAGNOSIS — E118 Type 2 diabetes mellitus with unspecified complications: Secondary | ICD-10-CM

## 2021-06-23 DIAGNOSIS — E782 Mixed hyperlipidemia: Secondary | ICD-10-CM

## 2021-06-23 DIAGNOSIS — I5021 Acute systolic (congestive) heart failure: Secondary | ICD-10-CM

## 2021-06-23 DIAGNOSIS — I10 Essential (primary) hypertension: Secondary | ICD-10-CM | POA: Diagnosis not present

## 2021-06-23 HISTORY — PX: RIGHT/LEFT HEART CATH AND CORONARY ANGIOGRAPHY: CATH118266

## 2021-06-23 LAB — POCT I-STAT EG7
Acid-Base Excess: 1 mmol/L (ref 0.0–2.0)
Acid-Base Excess: 2 mmol/L (ref 0.0–2.0)
Bicarbonate: 27 mmol/L (ref 20.0–28.0)
Bicarbonate: 27.9 mmol/L (ref 20.0–28.0)
Calcium, Ion: 1.39 mmol/L (ref 1.15–1.40)
Calcium, Ion: 1.42 mmol/L — ABNORMAL HIGH (ref 1.15–1.40)
HCT: 36 % (ref 36.0–46.0)
HCT: 36 % (ref 36.0–46.0)
Hemoglobin: 12.2 g/dL (ref 12.0–15.0)
Hemoglobin: 12.2 g/dL (ref 12.0–15.0)
O2 Saturation: 62 %
O2 Saturation: 63 %
Potassium: 3.7 mmol/L (ref 3.5–5.1)
Potassium: 3.7 mmol/L (ref 3.5–5.1)
Sodium: 141 mmol/L (ref 135–145)
Sodium: 141 mmol/L (ref 135–145)
TCO2: 28 mmol/L (ref 22–32)
TCO2: 29 mmol/L (ref 22–32)
pCO2, Ven: 46.7 mmHg (ref 44.0–60.0)
pCO2, Ven: 47.4 mmHg (ref 44.0–60.0)
pH, Ven: 7.369 (ref 7.250–7.430)
pH, Ven: 7.378 (ref 7.250–7.430)
pO2, Ven: 33 mmHg (ref 32.0–45.0)
pO2, Ven: 34 mmHg (ref 32.0–45.0)

## 2021-06-23 LAB — CBC
HCT: 34.3 % — ABNORMAL LOW (ref 36.0–46.0)
Hemoglobin: 11.1 g/dL — ABNORMAL LOW (ref 12.0–15.0)
MCH: 29.7 pg (ref 26.0–34.0)
MCHC: 32.4 g/dL (ref 30.0–36.0)
MCV: 91.7 fL (ref 80.0–100.0)
Platelets: 180 10*3/uL (ref 150–400)
RBC: 3.74 MIL/uL — ABNORMAL LOW (ref 3.87–5.11)
RDW: 16.7 % — ABNORMAL HIGH (ref 11.5–15.5)
WBC: 9.5 10*3/uL (ref 4.0–10.5)
nRBC: 0 % (ref 0.0–0.2)

## 2021-06-23 LAB — COOXEMETRY PANEL
Carboxyhemoglobin: 1.1 % (ref 0.5–1.5)
Methemoglobin: 1 % (ref 0.0–1.5)
O2 Saturation: 68.1 %
Total hemoglobin: 13.1 g/dL (ref 12.0–16.0)

## 2021-06-23 LAB — BASIC METABOLIC PANEL
Anion gap: 12 (ref 5–15)
BUN: 25 mg/dL — ABNORMAL HIGH (ref 8–23)
CO2: 21 mmol/L — ABNORMAL LOW (ref 22–32)
Calcium: 9.7 mg/dL (ref 8.9–10.3)
Chloride: 104 mmol/L (ref 98–111)
Creatinine, Ser: 1.47 mg/dL — ABNORMAL HIGH (ref 0.44–1.00)
GFR, Estimated: 38 mL/min — ABNORMAL LOW (ref >60)
Glucose, Bld: 77 mg/dL (ref 70–99)
Potassium: 3.7 mmol/L (ref 3.5–5.1)
Sodium: 137 mmol/L (ref 135–145)

## 2021-06-23 LAB — GLUCOSE, CAPILLARY
Glucose-Capillary: 87 mg/dL (ref 70–99)
Glucose-Capillary: 43 mg/dL — CL (ref 70–99)
Glucose-Capillary: 152 mg/dL — ABNORMAL HIGH (ref 70–99)
Glucose-Capillary: 187 mg/dL — ABNORMAL HIGH (ref 70–99)

## 2021-06-23 LAB — POCT I-STAT 7, (LYTES, BLD GAS, ICA,H+H)
Acid-Base Excess: 1 mmol/L (ref 0.0–2.0)
Bicarbonate: 25.6 mmol/L (ref 20.0–28.0)
Calcium, Ion: 1.39 mmol/L (ref 1.15–1.40)
HCT: 35 % — ABNORMAL LOW (ref 36.0–46.0)
Hemoglobin: 11.9 g/dL — ABNORMAL LOW (ref 12.0–15.0)
O2 Saturation: 95 %
Potassium: 3.7 mmol/L (ref 3.5–5.1)
Sodium: 141 mmol/L (ref 135–145)
TCO2: 27 mmol/L (ref 22–32)
pCO2 arterial: 39.4 mmHg (ref 32.0–48.0)
pH, Arterial: 7.42 (ref 7.350–7.450)
pO2, Arterial: 75 mmHg — ABNORMAL LOW (ref 83.0–108.0)

## 2021-06-23 LAB — PROTIME-INR
INR: 1.2 (ref 0.8–1.2)
Prothrombin Time: 15.1 seconds (ref 11.4–15.2)

## 2021-06-23 LAB — HEPARIN LEVEL (UNFRACTIONATED): Heparin Unfractionated: 0.98 IU/mL — ABNORMAL HIGH (ref 0.30–0.70)

## 2021-06-23 LAB — APTT: aPTT: 94 seconds — ABNORMAL HIGH (ref 24–36)

## 2021-06-23 LAB — SARS CORONAVIRUS 2 BY RT PCR (HOSPITAL ORDER, PERFORMED IN ~~LOC~~ HOSPITAL LAB): SARS Coronavirus 2: NEGATIVE

## 2021-06-23 SURGERY — RIGHT/LEFT HEART CATH AND CORONARY ANGIOGRAPHY
Anesthesia: LOCAL

## 2021-06-23 MED ORDER — LABETALOL HCL 5 MG/ML IV SOLN: 10.0000 mg | INTRAVENOUS | Status: AC | PRN

## 2021-06-23 MED ORDER — MORPHINE SULFATE (PF) 2 MG/ML IV SOLN
2.0000 mg | INTRAVENOUS | Status: DC | PRN
  Administered 2021-06-25: 2 mg via INTRAVENOUS
  Filled 2021-06-23: qty 1

## 2021-06-23 MED ORDER — DIGOXIN 125 MCG PO TABS
0.1250 mg | ORAL_TABLET | Freq: Every day | ORAL | Status: DC
  Administered 2021-06-23 – 2021-06-27 (×5): 0.125 mg via ORAL
  Filled 2021-06-23 (×5): qty 1

## 2021-06-23 MED ORDER — FUROSEMIDE 10 MG/ML IJ SOLN
80.0000 mg | Freq: Two times a day (BID) | INTRAMUSCULAR | Status: DC
  Administered 2021-06-23 – 2021-06-24 (×3): 80 mg via INTRAVENOUS
  Filled 2021-06-23 (×4): qty 8

## 2021-06-23 MED ORDER — HEPARIN (PORCINE) IN NACL 1000-0.9 UT/500ML-% IV SOLN
INTRAVENOUS | Status: AC
  Filled 2021-06-23: qty 1000

## 2021-06-23 MED ORDER — CHLORHEXIDINE GLUCONATE CLOTH 2 % EX PADS
6.0000 | MEDICATED_PAD | Freq: Every day | CUTANEOUS | Status: DC
  Administered 2021-06-23 – 2021-06-27 (×5): 6 via TOPICAL

## 2021-06-23 MED ORDER — SODIUM CHLORIDE 0.9 % IV SOLN: INTRAVENOUS | Status: AC

## 2021-06-23 MED ORDER — POTASSIUM CHLORIDE CRYS ER 20 MEQ PO TBCR
40.0000 meq | EXTENDED_RELEASE_TABLET | Freq: Once | ORAL | Status: AC
  Administered 2021-06-23: 40 meq via ORAL
  Filled 2021-06-23: qty 2

## 2021-06-23 MED ORDER — LIDOCAINE HCL (PF) 1 % IJ SOLN
INTRAMUSCULAR | Status: AC
  Filled 2021-06-23: qty 30

## 2021-06-23 MED ORDER — FENTANYL CITRATE (PF) 100 MCG/2ML IJ SOLN
INTRAMUSCULAR | Status: DC | PRN
  Administered 2021-06-23: 25 ug via INTRAVENOUS

## 2021-06-23 MED ORDER — ONDANSETRON HCL 4 MG/2ML IJ SOLN: 4.0000 mg | Freq: Four times a day (QID) | INTRAMUSCULAR | Status: DC | PRN

## 2021-06-23 MED ORDER — HEPARIN (PORCINE) 25000 UT/250ML-% IV SOLN
1050.0000 [IU]/h | INTRAVENOUS | Status: AC
  Administered 2021-06-23: 1050 [IU]/h via INTRAVENOUS
  Filled 2021-06-23 (×2): qty 250

## 2021-06-23 MED ORDER — ASPIRIN 81 MG PO CHEW: 81.0000 mg | CHEWABLE_TABLET | Freq: Every day | ORAL | Status: DC

## 2021-06-23 MED ORDER — MILRINONE LACTATE IN DEXTROSE 20-5 MG/100ML-% IV SOLN
0.1250 ug/kg/min | INTRAVENOUS | Status: DC
  Administered 2021-06-23 – 2021-06-26 (×5): 0.25 ug/kg/min via INTRAVENOUS
  Filled 2021-06-23 (×5): qty 100

## 2021-06-23 MED ORDER — SPIRONOLACTONE 12.5 MG HALF TABLET
12.5000 mg | ORAL_TABLET | Freq: Every day | ORAL | Status: DC
  Administered 2021-06-23 – 2021-06-24 (×2): 12.5 mg via ORAL
  Filled 2021-06-23 (×2): qty 1

## 2021-06-23 MED ORDER — SODIUM CHLORIDE 0.9% FLUSH
3.0000 mL | Freq: Two times a day (BID) | INTRAVENOUS | Status: DC
  Administered 2021-06-24 – 2021-06-27 (×7): 3 mL via INTRAVENOUS

## 2021-06-23 MED ORDER — HEPARIN (PORCINE) IN NACL 1000-0.9 UT/500ML-% IV SOLN
INTRAVENOUS | Status: DC | PRN
  Administered 2021-06-23 (×2): 500 mL

## 2021-06-23 MED ORDER — FUROSEMIDE 10 MG/ML IJ SOLN
40.0000 mg | Freq: Once | INTRAMUSCULAR | Status: AC
  Administered 2021-06-23: 40 mg via INTRAVENOUS
  Filled 2021-06-23: qty 4

## 2021-06-23 MED ORDER — MIDAZOLAM HCL 2 MG/2ML IJ SOLN
INTRAMUSCULAR | Status: AC
  Filled 2021-06-23: qty 2

## 2021-06-23 MED ORDER — NITROGLYCERIN 1 MG/10 ML FOR IR/CATH LAB
INTRA_ARTERIAL | Status: AC
  Filled 2021-06-23: qty 10

## 2021-06-23 MED ORDER — HEPARIN SODIUM (PORCINE) 1000 UNIT/ML IJ SOLN
INTRAMUSCULAR | Status: DC | PRN
  Administered 2021-06-23: 4000 [IU] via INTRAVENOUS

## 2021-06-23 MED ORDER — ALBUTEROL SULFATE (2.5 MG/3ML) 0.083% IN NEBU: 2.5000 mg | INHALATION_SOLUTION | Freq: Four times a day (QID) | RESPIRATORY_TRACT | Status: DC | PRN

## 2021-06-23 MED ORDER — LIDOCAINE HCL (PF) 1 % IJ SOLN
INTRAMUSCULAR | Status: DC | PRN
  Administered 2021-06-23 (×2): 2 mL

## 2021-06-23 MED ORDER — VERAPAMIL HCL 2.5 MG/ML IV SOLN
INTRAVENOUS | Status: AC
  Filled 2021-06-23: qty 2

## 2021-06-23 MED ORDER — SODIUM CHLORIDE 0.9 % IV SOLN: 250.0000 mL | INTRAVENOUS | Status: DC | PRN

## 2021-06-23 MED ORDER — MIDAZOLAM HCL 2 MG/2ML IJ SOLN
INTRAMUSCULAR | Status: DC | PRN
  Administered 2021-06-23: 1 mg via INTRAVENOUS

## 2021-06-23 MED ORDER — ACETAMINOPHEN 325 MG PO TABS
650.0000 mg | ORAL_TABLET | ORAL | Status: DC | PRN
  Administered 2021-06-23 – 2021-06-26 (×5): 650 mg via ORAL
  Filled 2021-06-23 (×5): qty 2

## 2021-06-23 MED ORDER — HYDRALAZINE HCL 20 MG/ML IJ SOLN: 10.0000 mg | INTRAMUSCULAR | Status: AC | PRN

## 2021-06-23 MED ORDER — AMIODARONE HCL 200 MG PO TABS
200.0000 mg | ORAL_TABLET | Freq: Two times a day (BID) | ORAL | Status: DC
  Administered 2021-06-23 – 2021-06-27 (×8): 200 mg via ORAL
  Filled 2021-06-23 (×8): qty 1

## 2021-06-23 MED ORDER — SODIUM CHLORIDE 0.9% FLUSH: 3.0000 mL | INTRAVENOUS | Status: DC | PRN

## 2021-06-23 MED ORDER — APIXABAN 5 MG PO TABS
5.0000 mg | ORAL_TABLET | Freq: Two times a day (BID) | ORAL | Status: DC
  Administered 2021-06-24 – 2021-06-27 (×7): 5 mg via ORAL
  Filled 2021-06-23 (×7): qty 1

## 2021-06-23 MED ORDER — METOPROLOL SUCCINATE ER 25 MG PO TB24
25.0000 mg | ORAL_TABLET | Freq: Every day | ORAL | Status: DC
  Administered 2021-06-23: 25 mg via ORAL
  Filled 2021-06-23: qty 1

## 2021-06-23 MED ORDER — HEPARIN SODIUM (PORCINE) 1000 UNIT/ML IJ SOLN
INTRAMUSCULAR | Status: AC
  Filled 2021-06-23: qty 10

## 2021-06-23 MED ORDER — FENTANYL CITRATE (PF) 100 MCG/2ML IJ SOLN
INTRAMUSCULAR | Status: AC
  Filled 2021-06-23: qty 2

## 2021-06-23 SURGICAL SUPPLY — 14 items
CATH 5FR JL3.5 JR4 ANG PIG MP (CATHETERS) ×1 IMPLANT
CATH SWAN GANZ 7F STRAIGHT (CATHETERS) ×1 IMPLANT
DEVICE RAD COMP TR BAND LRG (VASCULAR PRODUCTS) ×1 IMPLANT
GLIDESHEATH SLEND A-KIT 6F 22G (SHEATH) ×1 IMPLANT
GLIDESHEATH SLENDER 7FR .021G (SHEATH) ×1 IMPLANT
GUIDEWIRE .025 260CM (WIRE) ×1 IMPLANT
GUIDEWIRE INQWIRE 1.5J.035X260 (WIRE) IMPLANT
INQWIRE 1.5J .035X260CM (WIRE) ×2
KIT HEART LEFT (KITS) ×2 IMPLANT
PACK CARDIAC CATHETERIZATION (CUSTOM PROCEDURE TRAY) ×2 IMPLANT
STOPCOCK MORSE 400PSI 3WAY (MISCELLANEOUS) ×1 IMPLANT
TRANSDUCER W/STOPCOCK (MISCELLANEOUS) ×2 IMPLANT
TUBING CIL FLEX 10 FLL-RA (TUBING) ×2 IMPLANT
WIRE HI TORQ VERSACORE-J 145CM (WIRE) ×1 IMPLANT

## 2021-06-23 NOTE — TOC Benefit Eligibility Note (Signed)
Patient Teacher, English as a foreign language completed.    The patient is currently admitted and upon discharge could be taking Jardiance 10 mg.  The current 30 day co-pay is, $20.00.   The patient is currently admitted and upon discharge could be taking Entresto 24-26 mg.  The current 30 day co-pay is, $20.00.   The patient is currently admitted and upon discharge could be taking Farxiga 10 mg.  The current 30 day co-pay is, $24.00.   The patient is insured through [employer] Medicare part D     Stephanie Franco, Stephanie Franco Patient Advocate Specialist [place] Patient Advocate Team Direct Number: [phone]  Fax: [phone]

## 2021-06-23 NOTE — Progress Notes (Signed)
Pt arrived to cath lab holding, [place] 6, connected to monitor and pulse ox, no c/o pain, awaiting for procedure, safety maintained, call bell given, Purewick connected

## 2021-06-23 NOTE — Progress Notes (Addendum)
ANTICOAGULATION CONSULT NOTE  Pharmacy Consult for heparin Indication: chest pain/ACS  No Known Allergies  Patient Measurements: Height: 5' 3"  (160 cm) Weight: 83.8 kg (184 lb 11.2 oz) IBW/kg (Calculated) : 52.4 Heparin Dosing Weight: 71 kg   Vital Signs: Temp: 98.5 F (36.9 C) (02/06 0525) Temp Source: Oral (02/06 0525) BP: 104/74 (02/06 0525) Pulse Rate: 73 (02/06 0525)  Labs: Recent Labs    06/20/21 1329 06/20/21 1846 06/20/21 2107 06/21/21 0414 06/21/21 1053 06/22/21 0259 06/22/21 0941 06/22/21 2015 06/23/21 0807  HGB 12.5  --   --  12.6  --  12.1 11.7*  --  11.1*  HCT 39.8  --   --  39.1  --  38.0 36.9  --  34.3*  PLT 231  --   --  229  --  178 199  --  180  APTT  --   --   --  94*   < > 76* 60* 66*  --   LABPROT  --   --   --   --   --   --   --   --  15.1  INR  --   --   --   --   --   --   --   --  1.2  HEPARINUNFRC  --   --   --  >1.10*  --  >1.10*  --   --   --   CREATININE 2.11*  --   --  2.07*  --   --  1.59*  --   --   TROPONINIHS 71* 58* 51*  --   --   --   --   --   --    < > = values in this interval not displayed.     Estimated Creatinine Clearance: 32.8 mL/min (A) (by C-G formula based on SCr of 1.59 mg/dL (H)).   Medical History: Past Medical History:  Diagnosis Date   Aortic atherosclerosis (HCC)    Arthritis    Asthma    Atrial fibrillation, permanent (HCC)    Rate control with Bystolic. CHA2DS2Vasc = 6 (HTN, DM, CHF, Age 40, Female) -> on Pradaxa   Breast cancer ([place]) 2008   S/P mastectomy   CAD S/P percutaneous coronary angioplasty 2006;    PCI of circumflex with Taxus DES;; relook-cath Feb 2013: 50-60% short lesion in RCA, 40% ISR Circumflex stent.   CKD (chronic kidney disease), stage III (HCC)    Complication of anesthesia    prolonged sedation after colonoscopy in [place]   Diabetes mellitus, uncontrolled 07/14/2011   Dilated cardiomyopathy (HCC)    Mostly Resolved (EF up from ~25% to ~45% by Echo)   DOE (dyspnea on exertion)     Edema of both legs    Usually mild, chronic. Controlled with when necessary furosemide and diet   Fibroid, uterine 07/13/11   "have that now"   GERD (gastroesophageal reflux disease)    Gout    Hyperlipidemia    Hypertension    Hypokalemia    Morbid obesity ([place])    BMI 41   OSA on CPAP    On CPAP   Pulmonary hypertension, unspecified (HCC)    PAP ~90 mmHg on Echo 12/2017 -- has OSA on CPAP & obesity - but Overall Improved Sx of dyspnea / edema   Systolic murmur     Medications:  Medications Prior to Admission  Medication Sig Dispense Refill Last Dose   albuterol (PROVENTIL HFA;VENTOLIN HFA) 108 (90  Base) MCG/ACT inhaler Inhale 1-2 puffs into the lungs every 6 (six) hours as needed for wheezing or shortness of breath. 1 Inhaler 0 Past Month   albuterol (PROVENTIL) (2.5 MG/3ML) 0.083% nebulizer solution Take 3 mLs (2.5 mg total) by nebulization every 6 (six) hours as needed for wheezing or shortness of breath. 75 mL 12 Past Month   allopurinol (ZYLOPRIM) 300 MG tablet Take 300 mg by mouth daily.   06/19/2021   ELIQUIS 5 MG TABS tablet TAKE 1 TABLET BY MOUTH TWICE A DAY (Patient taking differently: Take 5 mg by mouth 2 (two) times daily.) 180 tablet 2 06/19/2021 at 1100   ezetimibe (ZETIA) 10 MG tablet TAKE 1 TABLET BY MOUTH EVERY DAY (Patient taking differently: Take 10 mg by mouth daily.) 90 tablet 0 06/19/2021   ferrous sulfate 325 (65 FE) MG EC tablet Take 325 mg by mouth daily with breakfast.   06/19/2021   fexofenadine (ALLEGRA) 180 MG tablet Take 180 mg by mouth daily as needed for allergies.   3 Past Week   fluticasone (FLONASE) 50 MCG/ACT nasal spray Place 2 sprays into the nose daily as needed for allergies or rhinitis.    Past Week   furosemide (LASIX) 80 MG tablet TAKE 1 TABLET BY MOUTH TWICE A DAY (Patient taking differently: Take 40 mg by mouth 2 (two) times daily.) 60 tablet 6 06/19/2021   gabapentin (NEURONTIN) 600 MG tablet Take 600 mg by mouth daily.   06/19/2021   Insulin  Glargine 300 UNIT/ML SOPN Inject 40 Units into the skin 2 (two) times daily before a meal.    06/19/2021   isosorbide mononitrate (IMDUR) 60 MG 24 hr tablet TAKE 1 TABLET BY MOUTH EVERY DAY (Patient taking differently: Take 60 mg by mouth daily.) 90 tablet 1 06/19/2021   LORazepam (ATIVAN) 1 MG tablet Take 1 mg by mouth 2 (two) times daily.   06/19/2021   Multiple Vitamins-Minerals (MULTIVITAMIN WITH MINERALS) tablet Take 1 tablet by mouth daily.   06/19/2021   nebivolol (BYSTOLIC) 10 MG tablet TAKE 1 TABLET BY MOUTH EVERY DAY (Patient taking differently: Take 10 mg by mouth daily.) 90 tablet 0 06/19/2021 at 1100   nitroGLYCERIN (NITROSTAT) 0.4 MG SL tablet DISSOLVE 1 TABLET UNDER THE TONGUE EVERY 5 MINUTES AS NEEDED FOR CHEST PAIN. (Patient taking differently: Place 0.4 mg under the tongue every 5 (five) minutes as needed for chest pain.) 25 tablet 0 06/20/2021   pantoprazole (PROTONIX) 40 MG tablet TAKE 1 TABLET BY MOUTH EVERY DAY (Patient taking differently: Take 40 mg by mouth daily.) 90 tablet 3 06/19/2021   potassium chloride SA (KLOR-CON M20) 20 MEQ tablet TAKE 1 TABLET BY MOUTH TWICE A DAY MAY TAKE ADDITIONAL 2 TABLETS AS NEEDED/DIRECTED (Patient taking differently: Take 20 mEq by mouth 2 (two) times daily.) 360 tablet 1 06/19/2021   triamcinolone ointment (KENALOG) 0.5 % Apply to legs 2 times daily for 2 weeks (Patient taking differently: Apply 1 application topically daily as needed (For rash).) 30 g 0 Past Week   blood glucose meter kit and supplies KIT Dispense based on patient and insurance preference. Use up to four times daily as directed. (FOR ICD-9 250.00, 250.01). 1 each 0    glucose blood test strip 1 each by Other route as needed for other. Use as instructed      metolazone (ZAROXOLYN) 5 MG tablet TAKE 1 TABLET 30 MINUTES BEFORE FUROSEMIDE DAILY AS NEEDED (Patient not taking: Reported on 06/20/2021) 10 tablet 0 Not Taking  rosuvastatin (CRESTOR) 40 MG tablet Take 1 tablet (40 mg total) by mouth daily.  (Patient not taking: Reported on 06/20/2021) 90 tablet 3 Not Taking   spironolactone (ALDACTONE) 25 MG tablet Take 1 tablet (25 mg total) by mouth daily. (Patient not taking: Reported on 06/20/2021) 30 tablet 6 Not Taking    Assessment: 49 YOF with chest pain concerning for ACS. Pharmacy consulted to start IV heparin. Of note, patient is on Eliquis at home for h/o Afib. Last dose was 2/2 PM per patient.   Heparin level falsely elevated by DOAC use and aPTT therapeutic this am. CBC stable. Cardiac cath planned pending renal function.  Goal of Therapy:  Heparin level 0.3-0.7 units/ml aPTT 66-102 seconds Monitor platelets by anticoagulation protocol: Yes   Plan:  Continue heparin 1050 units/h Daily heparin level, aPTT, CBC F/U plans for cath  ADDENDUM: Pt s/p LHC, heparin to resume in 4h after sheath removal (~1315).  Plan: Resume heparin 1050 units/h no bolus at [place], PharmD, [place], [facility] Clinical Pharmacist [phone] Please check AMION for all [place] numbers 06/23/2021

## 2021-06-23 NOTE — Progress Notes (Signed)
Patient c/o short winded upon walking to bathroom.  Patient assisted back to bed and O2 sat 98% on room air.  NS at 100 ml/hr for ^Cr.  Inspiratory wheezes noted.  IVF reduced to [facility].  O2 at 2L per [place] applied with sats 100%.  Patient then stated short winded may be anxiety.  She states she usually takes medication for anxiety every day in the evening.  Patient has lorazepam on home med list. Dr. Rudi Rummage notified of patient events and that she is requesting home lorazepam.  Dr. Rudi Rummage returned call with no new orders for home medication due to risk of hypoxia.  Updated patient and husband at bedside.  Pt stated she felt more calm.

## 2021-06-23 NOTE — H&P (View-Only) (Signed)
Progress Note  Patient Name: Stephanie Franco Date of Encounter: 06/23/2021  [facility] Cardiologist: Glenetta Hew, MD   Subjective   Developed worsening shortness of breath overnight, had O2 placed. Sitting up in bed. Says her breathing is ok this morning.   Inpatient Medications    Scheduled Meds:  aspirin EC  81 mg Oral Daily   ezetimibe  10 mg Oral Daily   gabapentin  600 mg Oral Daily   insulin glargine-yfgn  40 Units Subcutaneous BID   nebivolol  10 mg Oral Daily   pantoprazole  40 mg Oral Daily   rosuvastatin  40 mg Oral Daily   sodium chloride flush  3 mL Intravenous Q12H   Continuous Infusions:  sodium chloride Stopped (06/23/21 0206)   sodium chloride     sodium chloride     heparin 1,050 Units/hr (06/22/21 2149)   nitroGLYCERIN     PRN Meds: sodium chloride, acetaminophen, albuterol, nitroGLYCERIN, ondansetron (ZOFRAN) IV, polyethylene glycol, sodium chloride flush   Vital Signs    Vitals:   06/22/21 0516 06/22/21 1714 06/22/21 2152 06/23/21 0525  BP: 112/66 118/67 120/65 104/74  Pulse: 76 72 72 73  Resp:  16 18 16   Temp: (!) 97.2 F (36.2 C) 98.1 F (36.7 C) 98.5 F (36.9 C) 98.5 F (36.9 C)  TempSrc: Axillary Oral Oral Oral  SpO2: 98% 98% 100% 100%  Weight:      Height:        Intake/Output Summary (Last 24 hours) at 06/23/2021 0832 Last data filed at 06/23/2021 0400 Gross per 24 hour  Intake 3305.08 ml  Output --  Net 3305.08 ml   Last 3 Weights 06/20/2021 06/20/2021 05/28/2021  Weight (lbs) 184 lb 11.2 oz 186 lb 195 lb  Weight (kg) 83.779 kg 84.369 kg 88.451 kg      Telemetry    Afib - Personally Reviewed  ECG    No new tracing  Physical Exam   GEN: No acute distress. Sitting up in bed, [place] @2L .  Neck: Difficult to assess JVD, but does appear up Cardiac: Irreg Irreg, no murmurs, rubs, or gallops.  Respiratory: Diminished in bases GI: Soft, nontender, non-distended  MS: No edema; No deformity. Neuro:  Nonfocal  Psych: Normal  affect   Labs    High Sensitivity Troponin:   Recent Labs  Lab 06/20/21 1329 06/20/21 1846 06/20/21 2107  TROPONINIHS 71* 58* 51*     Chemistry Recent Labs  Lab 06/20/21 1329 06/21/21 0414 06/22/21 0941  NA 137 137 138  K 2.8* 3.1* 3.5  CL 95* 98 105  CO2 27 26 24   GLUCOSE 132* 122* 77  BUN 36* 39* 28*  CREATININE 2.11* 2.07* 1.59*  CALCIUM 10.4* 10.2 9.9  GFRNONAA 24* 25* 34*  ANIONGAP 15 13 9     Lipids No results for input(s): CHOL, TRIG, HDL, LABVLDL, LDLCALC, CHOLHDL in the last 168 hours.  Hematology Recent Labs  Lab 06/21/21 0414 06/22/21 0259 06/22/21 0941  WBC 14.6* 10.0 11.2*  RBC 4.30 4.10 4.04  HGB 12.6 12.1 11.7*  HCT 39.1 38.0 36.9  MCV 90.9 92.7 91.3  MCH 29.3 29.5 29.0  MCHC 32.2 31.8 31.7  RDW 16.6* 16.8* 16.8*  PLT 229 178 199   Thyroid No results for input(s): TSH, FREET4 in the last 168 hours.  BNPNo results for input(s): BNP, PROBNP in the last 168 hours.  DDimer No results for input(s): DDIMER in the last 168 hours.   Radiology    ECHOCARDIOGRAM  COMPLETE  Result Date: 06/21/2021    ECHOCARDIOGRAM REPORT   Patient Name:   Stephanie Franco Date of Exam: 06/21/2021 Medical Rec #:  [id number]         Height:       63.0 in Accession #:    [id number]        Weight:       184.7 lb Date of Birth:  [birth year]         BSA:          1.869 m Patient Age:    41 years          BP:           101/83 mmHg Patient Gender: F                 HR:           89 bpm. Exam Location:  Inpatient Procedure: 2D Echo, Cardiac Doppler and Color Doppler Indications:    NSTEMI I21.4  History:        Patient has prior history of Echocardiogram examinations. CHF,                 Pulmonary HTN, Arrythmias:Atrial Fibrillation; Risk                 Factors:Sleep Apnea, Diabetes, Hypertension and Dyslipidemia.                 Breast cancer 20 years ago in remission not on chemotherapy.  Sonographer:    Merrie Roof RDCS Referring Phys: [id number] Kie[place]  1. Left ventricular  ejection fraction, by estimation, is 25 to 30%. The left ventricle has severely decreased function. The left ventricle demonstrates regional wall motion abnormalities (see scoring diagram/findings for description). Left ventricular diastolic parameters are indeterminate.  2. Right ventricular systolic function is moderately reduced. The right ventricular size is normal. There is mildly elevated pulmonary artery systolic pressure. The estimated right ventricular systolic pressure is 26.9 mmHg.  3. Left atrial size was severely dilated.  4. Right atrial size was moderately dilated.  5. The mitral valve is abnormal. Restricted posterior leaflet. Moderate mitral valve regurgitation, eccentric and posteriorly directed.  6. The aortic valve is tricuspid. Aortic valve regurgitation is not visualized. No aortic stenosis is present. Aortic valve mean gradient measures 4.0 mmHg.  7. The inferior vena cava is dilated in size with >50% respiratory variability, suggesting right atrial pressure of 8 mmHg. Comparison(s): Prior images reviewed side by side. LVEF has decreased in comparison to prior study, now 25-30% range. Moderate, posteriorly directed mitral regurgitation. FINDINGS  Left Ventricle: Left ventricular ejection fraction, by estimation, is 25 to 30%. The left ventricle has severely decreased function. The left ventricle demonstrates regional wall motion abnormalities. The left ventricular internal cavity size was normal  in size. There is borderline left ventricular hypertrophy. Abnormal (paradoxical) septal motion, consistent with left bundle branch block. Left ventricular diastolic function could not be evaluated due to atrial fibrillation. Left ventricular diastolic parameters are indeterminate.  LV Wall Scoring: The basal inferior segment is akinetic. The inferior septum, mid and distal inferior wall, posterior wall, basal anterolateral segment, and apex are hypokinetic. The entire anterior wall, anterior septum,  apical lateral segment, and mid anterolateral segment are normal. Right Ventricle: The right ventricular size is normal. No increase in right ventricular wall thickness. Right ventricular systolic function is moderately reduced. There is mildly elevated pulmonary artery systolic pressure. The tricuspid regurgitant velocity is 2.98 m/s, and  with an assumed right atrial pressure of 8 mmHg, the estimated right ventricular systolic pressure is 00.9 mmHg. Left Atrium: Left atrial size was severely dilated. Right Atrium: Right atrial size was moderately dilated. Pericardium: There is no evidence of pericardial effusion. Mitral Valve: The mitral valve is abnormal. Moderate mitral valve regurgitation, with posteriorly-directed jet. Tricuspid Valve: The tricuspid valve is grossly normal. Tricuspid valve regurgitation is mild. Aortic Valve: The aortic valve is tricuspid. Aortic valve regurgitation is not visualized. No aortic stenosis is present. Aortic valve mean gradient measures 4.0 mmHg. Aortic valve peak gradient measures 7.1 mmHg. Aortic valve area, by VTI measures 1.47 cm. Pulmonic Valve: The pulmonic valve was grossly normal. Pulmonic valve regurgitation is trivial. Aorta: The aortic root is normal in size and structure. Venous: The inferior vena cava is dilated in size with greater than 50% respiratory variability, suggesting right atrial pressure of 8 mmHg. IAS/Shunts: No atrial level shunt detected by color flow Doppler.  LEFT VENTRICLE PLAX 2D LVIDd:         5.00 cm LVIDs:         4.40 cm LV PW:         1.20 cm LV IVS:        1.00 cm LVOT diam:     1.90 cm LV SV:         37 LV SV Index:   20 LVOT Area:     2.84 cm  RIGHT VENTRICLE            IVC RV Basal diam:  4.60 cm    IVC diam: 2.60 cm RV Mid diam:    3.50 cm RV S prime:     9.57 cm/s TAPSE (M-mode): 1.7 cm LEFT ATRIUM              Index        RIGHT ATRIUM           Index LA diam:        5.60 cm  3.00 cm/m   RA Area:     26.40 cm LA Vol (A2C):   124.0 ml  66.33 ml/m  RA Volume:   89.70 ml  47.99 ml/m LA Vol (A4C):   100.0 ml 53.50 ml/m LA Biplane Vol: 117.0 ml 62.59 ml/m  AORTIC VALVE AV Area (Vmax):    1.58 cm AV Area (Vmean):   1.42 cm AV Area (VTI):     1.47 cm AV Vmax:           133.00 cm/s AV Vmean:          88.500 cm/s AV VTI:            0.248 m AV Peak Grad:      7.1 mmHg AV Mean Grad:      4.0 mmHg LVOT Vmax:         73.90 cm/s LVOT Vmean:        44.200 cm/s LVOT VTI:          0.129 m LVOT/AV VTI ratio: 0.52  AORTA Ao Root diam: 2.90 cm Ao Asc diam:  3.30 cm MR Peak grad:    84.3 mmHg    TRICUSPID VALVE MR Mean grad:    49.0 mmHg    TR Peak grad:   35.5 mmHg MR Vmax:         459.00 cm/s  TR Vmax:        298.00 cm/s MR Vmean:        321.0 cm/s  MR PISA:         2.26 cm     SHUNTS MR PISA Eff ROA: 16 mm       Systemic VTI:  0.13 m MR PISA Radius:  0.60 cm      Systemic Diam: 1.90 cm Rozann Lesches MD Electronically signed by Rozann Lesches MD Signature Date/Time: 06/21/2021/3:27:27 PM    Final     Cardiac Studies   Echo: 06/21/21  IMPRESSIONS     1. Left ventricular ejection fraction, by estimation, is 25 to 30%. The  left ventricle has severely decreased function. The left ventricle  demonstrates regional wall motion abnormalities (see scoring  diagram/findings for description). Left ventricular  diastolic parameters are indeterminate.   2. Right ventricular systolic function is moderately reduced. The right  ventricular size is normal. There is mildly elevated pulmonary artery  systolic pressure. The estimated right ventricular systolic pressure is  03.5 mmHg.   3. Left atrial size was severely dilated.   4. Right atrial size was moderately dilated.   5. The mitral valve is abnormal. Restricted posterior leaflet. Moderate  mitral valve regurgitation, eccentric and posteriorly directed.   6. The aortic valve is tricuspid. Aortic valve regurgitation is not  visualized. No aortic stenosis is present. Aortic valve mean gradient   measures 4.0 mmHg.   7. The inferior vena cava is dilated in size with >50% respiratory  variability, suggesting right atrial pressure of 8 mmHg.   Comparison(s): Prior images reviewed side by side. LVEF has decreased in  comparison to prior study, now 25-30% range. Moderate, posteriorly  directed mitral regurgitation.   FINDINGS   Left Ventricle: Left ventricular ejection fraction, by estimation, is 25  to 30%. The left ventricle has severely decreased function. The left  ventricle demonstrates regional wall motion abnormalities. The left  ventricular internal cavity size was normal   in size. There is borderline left ventricular hypertrophy. Abnormal  (paradoxical) septal motion, consistent with left bundle branch block.  Left ventricular diastolic function could not be evaluated due to atrial  fibrillation. Left ventricular diastolic  parameters are indeterminate.      LV Wall Scoring:  The basal inferior segment is akinetic. The inferior septum, mid and  distal  inferior wall, posterior wall, basal anterolateral segment, and apex are  hypokinetic. The entire anterior wall, anterior septum, apical lateral  segment, and mid anterolateral segment are normal.   Right Ventricle: The right ventricular size is normal. No increase in  right ventricular wall thickness. Right ventricular systolic function is  moderately reduced. There is mildly elevated pulmonary artery systolic  pressure. The tricuspid regurgitant  velocity is 2.98 m/s, and with an assumed right atrial pressure of 8 mmHg,  the estimated right ventricular systolic pressure is 00.9 mmHg.   Left Atrium: Left atrial size was severely dilated.   Right Atrium: Right atrial size was moderately dilated.   Pericardium: There is no evidence of pericardial effusion.   Mitral Valve: The mitral valve is abnormal. Moderate mitral valve  regurgitation, with posteriorly-directed jet.   Tricuspid Valve: The tricuspid valve is  grossly normal. Tricuspid valve  regurgitation is mild.   Aortic Valve: The aortic valve is tricuspid. Aortic valve regurgitation is  not visualized. No aortic stenosis is present. Aortic valve mean gradient  measures 4.0 mmHg. Aortic valve peak gradient measures 7.1 mmHg. Aortic  valve area, by VTI measures 1.47  cm.   Pulmonic Valve: The pulmonic valve was grossly normal. Pulmonic valve  regurgitation is  trivial.   Aorta: The aortic root is normal in size and structure.   Venous: The inferior vena cava is dilated in size with greater than 50%  respiratory variability, suggesting right atrial pressure of 8 mmHg.   IAS/Shunts: No atrial level shunt detected by color flow Doppler.   Patient Profile     71 y.o. female with past medical history of permanent atrial fibrillation on Eliquis, CAD, history of systolic heart failure with improved EF, hypertension, hyperlipidemia, DM2 and morbid obesity who was seen 06/20/2021 for the evaluation of chest pain.  Assessment & Plan    Chest pain with mildly elevated troponin/CAD prior PCI of OM '05: hsTn 71>>51. Planned for cardiac cath today. No chest pain overnight.  -- on ASA, statin, BB, IV heparin  Permanent atrial fibrillation: rates controlled. Eliquis held -- IV heparin  Acute on chronic renal insufficiency: home spiro, metolazone, and lasix held. Cr peaked at 2>>1.59 with IV hydration. Of note has been referred to nephrology as an outpatient but has not had initial visit yet. -- unable to add ACE/ARB/Spiro/Entresto at this time, may be able to add prior to discharge  -- BMET pending this morning  HFrEF/prior hx of same with improved EF: LVEF down to 25-30%, basal akinesis, hypokinesis of the inferior septum, mid/distal, anterolateral wall. Moderately reduced RV. Has received IVFs pre cath, developed shortness of breath overnight, placed on O2. She is net + 4.7L. Sitting upright in bed.  -- will give IV lasix 40mg  x1 -- currently on  bystolic 10mg  daily, will transition to Toprol 25mg  daily  -- as above unstable to add ACE/ARB/spiro with Cr   HTN: blood pressures borderline soft but tolerating meds  HLD: LDL 73 -- on Crestor 40mg  daily, Zetia   DM: Hgb A1c 7.0 (12/22) -- SSI -- plan to add SGLT2 post cath   For questions or updates, please contact [place] Please consult [email or web address] for contact info under        Signed, Reino Bellis, NP  06/23/2021, 8:32 AM     I have examined the patient and reviewed assessment and plan and discussed with patient.  Agree with above as stated.    Plan for right and left heart cath today in the setting of mildly elevated troponin and heart failure symptoms.  Creatinine improved but she did get a lot of IV fluids.  We will give a dose of Lasix.  She looks much more comfortable now lying in bed.  Her right wrist is bandaged but she says that was from an IV.  Her radial pulse above the bandage is brisk.  Hopefully, cath will be able to be done from the right wrist.  Larae Grooms

## 2021-06-23 NOTE — Interval H&P Note (Signed)
Cath Lab Visit (complete for each Cath Lab visit)  Clinical Evaluation Leading to the Procedure:   ACS: No.  Non-ACS:    Anginal Classification: CCS I  Anti-ischemic medical therapy: No Therapy  Non-Invasive Test Results: No non-invasive testing performed  Prior CABG: No previous CABG      History and Physical Interval Note:  06/23/2021 12:23 PM  Stephanie Franco  has presented today for surgery, with the diagnosis of unstable angina.  The various methods of treatment have been discussed with the patient and family. After consideration of risks, benefits and other options for treatment, the patient has consented to  Procedure(s): RIGHT/LEFT HEART CATH AND CORONARY ANGIOGRAPHY (N/A) as a surgical intervention.  The patient's history has been reviewed, patient examined, no change in status, stable for surgery.  I have reviewed the patient's chart and labs.  Questions were answered to the patient's satisfaction.     [name]

## 2021-06-23 NOTE — Progress Notes (Signed)
Inpatient Diabetes Program Recommendations  AACE/ADA: New Consensus Statement on Inpatient Glycemic Control (2015)  Target Ranges:  Prepandial:   less than 140 mg/dL      Peak postprandial:   less than 180 mg/dL (1-2 hours)      Critically ill patients:  140 - 180 mg/dL   Lab Results  Component Value Date   GLUCAP 43 (LL) 06/23/2021   HGBA1C 7.0 (H) 04/24/2021    Review of Glycemic Control  Latest Reference Range & Units 06/22/21 07:30 06/22/21 17:08 06/22/21 21:59 06/23/21 07:15 06/23/21 14:44  Glucose-Capillary 70 - 99 mg/dL 75 92 186 (H) 87 43 (LL)   Diabetes history: DM 2 Outpatient Diabetes medications:  Lantus 40 units bid Current orders for Inpatient glycemic control:  Semglee 40 units bid  Inpatient Diabetes Program Recommendations:    Please consider reducing Semglee to 20 units bid and consider adding Novolog sensitive correction with CBG's tid with meals and HS.    Thanks,  Adah Perl, RN, BC-ADM Inpatient Diabetes Coordinator Pager [phone]  (8a-5p)

## 2021-06-23 NOTE — Progress Notes (Addendum)
Progress Note  Patient Name: Stephanie Franco Date of Encounter: 06/23/2021  [facility] Cardiologist: Glenetta Hew, MD   Subjective   Developed worsening shortness of breath overnight, had O2 placed. Sitting up in bed. Says her breathing is ok this morning.   Inpatient Medications    Scheduled Meds:  aspirin EC  81 mg Oral Daily   ezetimibe  10 mg Oral Daily   gabapentin  600 mg Oral Daily   insulin glargine-yfgn  40 Units Subcutaneous BID   nebivolol  10 mg Oral Daily   pantoprazole  40 mg Oral Daily   rosuvastatin  40 mg Oral Daily   sodium chloride flush  3 mL Intravenous Q12H   Continuous Infusions:  sodium chloride Stopped (06/23/21 0206)   sodium chloride     sodium chloride     heparin 1,050 Units/hr (06/22/21 2149)   nitroGLYCERIN     PRN Meds: sodium chloride, acetaminophen, albuterol, nitroGLYCERIN, ondansetron (ZOFRAN) IV, polyethylene glycol, sodium chloride flush   Vital Signs    Vitals:   06/22/21 0516 06/22/21 1714 06/22/21 2152 06/23/21 0525  BP: 112/66 118/67 120/65 104/74  Pulse: 76 72 72 73  Resp:  16 18 16   Temp: (!) 97.2 F (36.2 C) 98.1 F (36.7 C) 98.5 F (36.9 C) 98.5 F (36.9 C)  TempSrc: Axillary Oral Oral Oral  SpO2: 98% 98% 100% 100%  Weight:      Height:        Intake/Output Summary (Last 24 hours) at 06/23/2021 0832 Last data filed at 06/23/2021 0400 Gross per 24 hour  Intake 3305.08 ml  Output --  Net 3305.08 ml   Last 3 Weights 06/20/2021 06/20/2021 05/28/2021  Weight (lbs) 184 lb 11.2 oz 186 lb 195 lb  Weight (kg) 83.779 kg 84.369 kg 88.451 kg      Telemetry    Afib - Personally Reviewed  ECG    No new tracing  Physical Exam   GEN: No acute distress. Sitting up in bed, [place] @2L .  Neck: Difficult to assess JVD, but does appear up Cardiac: Irreg Irreg, no murmurs, rubs, or gallops.  Respiratory: Diminished in bases GI: Soft, nontender, non-distended  MS: No edema; No deformity. Neuro:  Nonfocal  Psych: Normal  affect   Labs    High Sensitivity Troponin:   Recent Labs  Lab 06/20/21 1329 06/20/21 1846 06/20/21 2107  TROPONINIHS 71* 58* 51*     Chemistry Recent Labs  Lab 06/20/21 1329 06/21/21 0414 06/22/21 0941  NA 137 137 138  K 2.8* 3.1* 3.5  CL 95* 98 105  CO2 27 26 24   GLUCOSE 132* 122* 77  BUN 36* 39* 28*  CREATININE 2.11* 2.07* 1.59*  CALCIUM 10.4* 10.2 9.9  GFRNONAA 24* 25* 34*  ANIONGAP 15 13 9     Lipids No results for input(s): CHOL, TRIG, HDL, LABVLDL, LDLCALC, CHOLHDL in the last 168 hours.  Hematology Recent Labs  Lab 06/21/21 0414 06/22/21 0259 06/22/21 0941  WBC 14.6* 10.0 11.2*  RBC 4.30 4.10 4.04  HGB 12.6 12.1 11.7*  HCT 39.1 38.0 36.9  MCV 90.9 92.7 91.3  MCH 29.3 29.5 29.0  MCHC 32.2 31.8 31.7  RDW 16.6* 16.8* 16.8*  PLT 229 178 199   Thyroid No results for input(s): TSH, FREET4 in the last 168 hours.  BNPNo results for input(s): BNP, PROBNP in the last 168 hours.  DDimer No results for input(s): DDIMER in the last 168 hours.   Radiology    ECHOCARDIOGRAM  COMPLETE  Result Date: 06/21/2021    ECHOCARDIOGRAM REPORT   Patient Name:   [name] Date of Exam: 06/21/2021 Medical Rec #:  [id number]         Height:       63.0 in Accession #:    [id number]        Weight:       184.7 lb Date of Birth:  29-Jul-1950         BSA:          1.869 m Patient Age:    71 years          BP:           101/83 mmHg Patient Gender: F                 HR:           89 bpm. Exam Location:  Inpatient Procedure: 2D Echo, Cardiac Doppler and Color Doppler Indications:    NSTEMI I21.4  History:        Patient has prior history of Echocardiogram examinations. CHF,                 Pulmonary HTN, Arrythmias:Atrial Fibrillation; Risk                 Factors:Sleep Apnea, Diabetes, Hypertension and Dyslipidemia.                 Breast cancer 20 years ago in remission not on chemotherapy.  Sonographer:    Merrie Roof RDCS Referring Phys: [id number] [place]  1. Left ventricular  ejection fraction, by estimation, is 25 to 30%. The left ventricle has severely decreased function. The left ventricle demonstrates regional wall motion abnormalities (see scoring diagram/findings for description). Left ventricular diastolic parameters are indeterminate.  2. Right ventricular systolic function is moderately reduced. The right ventricular size is normal. There is mildly elevated pulmonary artery systolic pressure. The estimated right ventricular systolic pressure is 32.9 mmHg.  3. Left atrial size was severely dilated.  4. Right atrial size was moderately dilated.  5. The mitral valve is abnormal. Restricted posterior leaflet. Moderate mitral valve regurgitation, eccentric and posteriorly directed.  6. The aortic valve is tricuspid. Aortic valve regurgitation is not visualized. No aortic stenosis is present. Aortic valve mean gradient measures 4.0 mmHg.  7. The inferior vena cava is dilated in size with >50% respiratory variability, suggesting right atrial pressure of 8 mmHg. Comparison(s): Prior images reviewed side by side. LVEF has decreased in comparison to prior study, now 25-30% range. Moderate, posteriorly directed mitral regurgitation. FINDINGS  Left Ventricle: Left ventricular ejection fraction, by estimation, is 25 to 30%. The left ventricle has severely decreased function. The left ventricle demonstrates regional wall motion abnormalities. The left ventricular internal cavity size was normal  in size. There is borderline left ventricular hypertrophy. Abnormal (paradoxical) septal motion, consistent with left bundle branch block. Left ventricular diastolic function could not be evaluated due to atrial fibrillation. Left ventricular diastolic parameters are indeterminate.  LV Wall Scoring: The basal inferior segment is akinetic. The inferior septum, mid and distal inferior wall, posterior wall, basal anterolateral segment, and apex are hypokinetic. The entire anterior wall, anterior septum,  apical lateral segment, and mid anterolateral segment are normal. Right Ventricle: The right ventricular size is normal. No increase in right ventricular wall thickness. Right ventricular systolic function is moderately reduced. There is mildly elevated pulmonary artery systolic pressure. The tricuspid regurgitant velocity is 2.98 m/s, and  with an assumed right atrial pressure of 8 mmHg, the estimated right ventricular systolic pressure is 78.9 mmHg. Left Atrium: Left atrial size was severely dilated. Right Atrium: Right atrial size was moderately dilated. Pericardium: There is no evidence of pericardial effusion. Mitral Valve: The mitral valve is abnormal. Moderate mitral valve regurgitation, with posteriorly-directed jet. Tricuspid Valve: The tricuspid valve is grossly normal. Tricuspid valve regurgitation is mild. Aortic Valve: The aortic valve is tricuspid. Aortic valve regurgitation is not visualized. No aortic stenosis is present. Aortic valve mean gradient measures 4.0 mmHg. Aortic valve peak gradient measures 7.1 mmHg. Aortic valve area, by VTI measures 1.47 cm. Pulmonic Valve: The pulmonic valve was grossly normal. Pulmonic valve regurgitation is trivial. Aorta: The aortic root is normal in size and structure. Venous: The inferior vena cava is dilated in size with greater than 50% respiratory variability, suggesting right atrial pressure of 8 mmHg. IAS/Shunts: No atrial level shunt detected by color flow Doppler.  LEFT VENTRICLE PLAX 2D LVIDd:         5.00 cm LVIDs:         4.40 cm LV PW:         1.20 cm LV IVS:        1.00 cm LVOT diam:     1.90 cm LV SV:         37 LV SV Index:   20 LVOT Area:     2.84 cm  RIGHT VENTRICLE            IVC RV Basal diam:  4.60 cm    IVC diam: 2.60 cm RV Mid diam:    3.50 cm RV S prime:     9.57 cm/s TAPSE (M-mode): 1.7 cm LEFT ATRIUM              Index        RIGHT ATRIUM           Index LA diam:        5.60 cm  3.00 cm/m   RA Area:     26.40 cm LA Vol (A2C):   124.0 ml  66.33 ml/m  RA Volume:   89.70 ml  47.99 ml/m LA Vol (A4C):   100.0 ml 53.50 ml/m LA Biplane Vol: 117.0 ml 62.59 ml/m  AORTIC VALVE AV Area (Vmax):    1.58 cm AV Area (Vmean):   1.42 cm AV Area (VTI):     1.47 cm AV Vmax:           133.00 cm/s AV Vmean:          88.500 cm/s AV VTI:            0.248 m AV Peak Grad:      7.1 mmHg AV Mean Grad:      4.0 mmHg LVOT Vmax:         73.90 cm/s LVOT Vmean:        44.200 cm/s LVOT VTI:          0.129 m LVOT/AV VTI ratio: 0.52  AORTA Ao Root diam: 2.90 cm Ao Asc diam:  3.30 cm MR Peak grad:    84.3 mmHg    TRICUSPID VALVE MR Mean grad:    49.0 mmHg    TR Peak grad:   35.5 mmHg MR Vmax:         459.00 cm/s  TR Vmax:        298.00 cm/s MR Vmean:        321.0 cm/s  MR PISA:         2.26 cm     SHUNTS MR PISA Eff ROA: 16 mm       Systemic VTI:  0.13 m MR PISA Radius:  0.60 cm      Systemic Diam: 1.90 cm Rozann Lesches MD Electronically signed by Rozann Lesches MD Signature Date/Time: 06/21/2021/3:27:27 PM    Final     Cardiac Studies   Echo: 06/21/21  IMPRESSIONS     1. Left ventricular ejection fraction, by estimation, is 25 to 30%. The  left ventricle has severely decreased function. The left ventricle  demonstrates regional wall motion abnormalities (see scoring  diagram/findings for description). Left ventricular  diastolic parameters are indeterminate.   2. Right ventricular systolic function is moderately reduced. The right  ventricular size is normal. There is mildly elevated pulmonary artery  systolic pressure. The estimated right ventricular systolic pressure is  22.6 mmHg.   3. Left atrial size was severely dilated.   4. Right atrial size was moderately dilated.   5. The mitral valve is abnormal. Restricted posterior leaflet. Moderate  mitral valve regurgitation, eccentric and posteriorly directed.   6. The aortic valve is tricuspid. Aortic valve regurgitation is not  visualized. No aortic stenosis is present. Aortic valve mean gradient   measures 4.0 mmHg.   7. The inferior vena cava is dilated in size with >50% respiratory  variability, suggesting right atrial pressure of 8 mmHg.   Comparison(s): Prior images reviewed side by side. LVEF has decreased in  comparison to prior study, now 25-30% range. Moderate, posteriorly  directed mitral regurgitation.   FINDINGS   Left Ventricle: Left ventricular ejection fraction, by estimation, is 25  to 30%. The left ventricle has severely decreased function. The left  ventricle demonstrates regional wall motion abnormalities. The left  ventricular internal cavity size was normal   in size. There is borderline left ventricular hypertrophy. Abnormal  (paradoxical) septal motion, consistent with left bundle branch block.  Left ventricular diastolic function could not be evaluated due to atrial  fibrillation. Left ventricular diastolic  parameters are indeterminate.      LV Wall Scoring:  The basal inferior segment is akinetic. The inferior septum, mid and  distal  inferior wall, posterior wall, basal anterolateral segment, and apex are  hypokinetic. The entire anterior wall, anterior septum, apical lateral  segment, and mid anterolateral segment are normal.   Right Ventricle: The right ventricular size is normal. No increase in  right ventricular wall thickness. Right ventricular systolic function is  moderately reduced. There is mildly elevated pulmonary artery systolic  pressure. The tricuspid regurgitant  velocity is 2.98 m/s, and with an assumed right atrial pressure of 8 mmHg,  the estimated right ventricular systolic pressure is 33.3 mmHg.   Left Atrium: Left atrial size was severely dilated.   Right Atrium: Right atrial size was moderately dilated.   Pericardium: There is no evidence of pericardial effusion.   Mitral Valve: The mitral valve is abnormal. Moderate mitral valve  regurgitation, with posteriorly-directed jet.   Tricuspid Valve: The tricuspid valve is  grossly normal. Tricuspid valve  regurgitation is mild.   Aortic Valve: The aortic valve is tricuspid. Aortic valve regurgitation is  not visualized. No aortic stenosis is present. Aortic valve mean gradient  measures 4.0 mmHg. Aortic valve peak gradient measures 7.1 mmHg. Aortic  valve area, by VTI measures 1.47  cm.   Pulmonic Valve: The pulmonic valve was grossly normal. Pulmonic valve  regurgitation is  trivial.   Aorta: The aortic root is normal in size and structure.   Venous: The inferior vena cava is dilated in size with greater than 50%  respiratory variability, suggesting right atrial pressure of 8 mmHg.   IAS/Shunts: No atrial level shunt detected by color flow Doppler.   Patient Profile     71 y.o. female with past medical history of permanent atrial fibrillation on Eliquis, CAD, history of systolic heart failure with improved EF, hypertension, hyperlipidemia, DM2 and morbid obesity who was seen 06/20/2021 for the evaluation of chest pain.  Assessment & Plan    Chest pain with mildly elevated troponin/CAD prior PCI of OM '05: hsTn 71>>51. Planned for cardiac cath today. No chest pain overnight.  -- on ASA, statin, BB, IV heparin  Permanent atrial fibrillation: rates controlled. Eliquis held -- IV heparin  Acute on chronic renal insufficiency: home spiro, metolazone, and lasix held. Cr peaked at 2>>1.59 with IV hydration. Of note has been referred to nephrology as an outpatient but has not had initial visit yet. -- unable to add ACE/ARB/Spiro/Entresto at this time, may be able to add prior to discharge  -- BMET pending this morning  HFrEF/prior hx of same with improved EF: LVEF down to 25-30%, basal akinesis, hypokinesis of the inferior septum, mid/distal, anterolateral wall. Moderately reduced RV. Has received IVFs pre cath, developed shortness of breath overnight, placed on O2. She is net + 4.7L. Sitting upright in bed.  -- will give IV lasix 40mg  x1 -- currently on  bystolic 10mg  daily, will transition to Toprol 25mg  daily  -- as above unstable to add ACE/ARB/spiro with Cr   HTN: blood pressures borderline soft but tolerating meds  HLD: LDL 73 -- on Crestor 40mg  daily, Zetia   DM: Hgb A1c 7.0 (12/22) -- SSI -- plan to add SGLT2 post cath   For questions or updates, please contact [place] Please consult [email or web address] for contact info under        Signed, Reino Bellis, NP  06/23/2021, 8:32 AM     I have examined the patient and reviewed assessment and plan and discussed with patient.  Agree with above as stated.    Plan for right and left heart cath today in the setting of mildly elevated troponin and heart failure symptoms.  Creatinine improved but she did get a lot of IV fluids.  We will give a dose of Lasix.  She looks much more comfortable now lying in bed.  Her right wrist is bandaged but she says that was from an IV.  Her radial pulse above the bandage is brisk.  Hopefully, cath will be able to be done from the right wrist.  Larae Grooms

## 2021-06-23 NOTE — Progress Notes (Addendum)
Peripherally Inserted Central Catheter Placement  The IV Nurse has discussed with the patient and/or persons authorized to consent for the patient, the purpose of this procedure and the potential benefits and risks involved with this procedure.  The benefits include less needle sticks, lab draws from the catheter, and the patient may be discharged home with the catheter. Risks include, but not limited to, infection, bleeding, blood clot (thrombus formation), and puncture of an artery; nerve damage and irregular heartbeat and possibility to perform a PICC exchange if needed/ordered by physician.  Alternatives to this procedure were also discussed.  [employer] PICC patient education guide, fact sheet on infection prevention and patient information card has been provided to patient /or left at bedside.  Tourniquet was not used.  PICC inserted by Christella Noa, RN   PICC Placement Documentation  PICC Double Lumen 32/67/12 PICC Right Basilic 38 cm 0 cm (Active)  Indication for Insertion or Continuance of Line Vasoactive infusions;Chronic illness with exacerbations (CF, Sickle Cell, etc.) 06/23/21 1753  Exposed Catheter (cm) 0 cm 06/23/21 1753  Site Assessment Clean;Dry;Intact 06/23/21 1753  Lumen #1 Status Flushed;Saline locked;Blood return noted 06/23/21 1753  Lumen #2 Status Flushed;Saline locked;Blood return noted 06/23/21 1753  Dressing Type Transparent 06/23/21 1753  Dressing Status Clean;Dry;Intact 06/23/21 1753  Antimicrobial disc in place? Yes 06/23/21 1753  Safety Lock Not Applicable 45/80/99 8338  Line Care Connections checked and tightened 06/23/21 1753  Line Adjustment (NICU/IV Team Only) No 06/23/21 1753  Dressing Intervention New dressing 06/23/21 1753  Dressing Change Due 06/30/21 06/23/21 [place], Nicolette Bang 06/23/2021, 5:54 PM

## 2021-06-23 NOTE — Progress Notes (Signed)
Per Darrick Grinder, NP/Dr. Aundra Dubin okay to place PICC in right arm post cardiac cath with no tourniquet.

## 2021-06-23 NOTE — Consult Note (Addendum)
Advanced Heart Failure Team Consult Note   Primary Physician: Lin Landsman, MD PCP-Cardiologist:  Glenetta Hew, MD  Reason for Consultation: Heart Failure   HPI:    Stephanie Franco is seen today for evaluation of heart failure at the request of Dr Scarlette Calico.   Stephanie Franco is a 71 year old with history of permanent A fib for 20 years, DMII, HTN, Hyperlipidemia, left breast cancer (has mastectomy 20 years ago), right hip replacement, arthritis, and CAD prior PCI OM in 07-19-03.   Had sleep study 05/2021- negative for sleep apnea.   Prior to admit ambulates with a cane due to arthritis. + orthopnea. Denies virus.   Admitted with chest pain. CXR with mild vascular congestion. Pertinent labs included: HS Trop 71>58>51,  WBC 12.8, Hgb 12.5 . Echo EF 25-30%. Started on IV lasix. Weight has gone up.   RHC/LHC-   RA 21, PA 80/30, CO 3.6 , CI 1.9. Prox RCA to Mid RCA lesion is 60% stenosed.  Dist Cx lesion is 90% stenosed. Previously placed Mid LAD stent (unknown type) is  widely patent.  Cardiac Testing -Echo Jul 18, 2021 - EF 25% RV moderately reduced, LA severely dilated, RA moderately dilated, MV restricted posterior leaflet . Moderate MR.  -Echo 18-Jul-2018 EF 50-55%  LA and RA moderately dilated. -Echo 2017/07/18 Ef 40-45% RV moderately dilated.   FH: her son died 07-18-2017 massive heart attack    Review of Systems: [y] = yes, [ ]  = no   General: Weight gain [ ] ; Weight loss [ ] ; Anorexia [ ] ; Fatigue [ ] ; Fever [ ] ; Chills [ ] ; Weakness [Y ]  Cardiac: Chest pain/pressure [ ] ; Resting SOB [ ] ; Exertional SOB [ Y]; Orthopnea [ Y]; Pedal Edema [Y ]; Palpitations [ ] ; Syncope [ ] ; Presyncope [ ] ; Paroxysmal nocturnal dyspnea[ ]   Pulmonary: Cough [ ] ; Wheezing[ ] ; Hemoptysis[ ] ; Sputum [ ] ; Snoring [ ]   GI: Vomiting[ ] ; Dysphagia[ ] ; Melena[ ] ; Hematochezia [ ] ; Heartburn[ ] ; Abdominal pain [ ] ; Constipation [Y ]; Diarrhea [ ] ; BRBPR [ ]   GU: Hematuria[ ] ; Dysuria [ ] ; Nocturia[ ]   Vascular: Pain in legs with  walking [ ] ; Pain in feet with lying flat [ ] ; Non-healing sores [ ] ; Stroke [ ] ; TIA [ ] ; Slurred speech [ ] ;  Neuro: Headaches[ ] ; Vertigo[ ] ; Seizures[ ] ; Paresthesias[ ] ;Blurred vision [ ] ; Diplopia [ ] ; Vision changes [ ]   Ortho/Skin: Arthritis [ ] ; Joint pain [Y ]; Muscle pain [ ] ; Joint swelling [ ] ; Back Pain [ Y]; Rash [ ]   Psych: Depression[ ] ; Anxiety[ ]   Heme: Bleeding problems [ ] ; Clotting disorders [ ] ; Anemia [ ]   Endocrine: Diabetes [ Y]; Thyroid dysfunction[ ]   Home Medications Prior to Admission medications   Medication Sig Start Date End Date Taking? Authorizing Provider  albuterol (PROVENTIL HFA;VENTOLIN HFA) 108 (90 Base) MCG/ACT inhaler Inhale 1-2 puffs into the lungs every 6 (six) hours as needed for wheezing or shortness of breath. 05/20/15  Yes Harvel Quale, MD  albuterol (PROVENTIL) (2.5 MG/3ML) 0.083% nebulizer solution Take 3 mLs (2.5 mg total) by nebulization every 6 (six) hours as needed for wheezing or shortness of breath. 05/20/15  Yes Harvel Quale, MD  allopurinol (ZYLOPRIM) 300 MG tablet Take 300 mg by mouth daily.   Yes [provider]  ELIQUIS 5 MG TABS tablet TAKE 1 TABLET BY MOUTH TWICE A DAY Patient taking differently: Take 5 mg by mouth 2 (two) times daily.  07/11/19  Yes Leonie Man, MD  ezetimibe (ZETIA) 10 MG tablet TAKE 1 TABLET BY MOUTH EVERY DAY Patient taking differently: Take 10 mg by mouth daily. 03/05/21  Yes Leonie Man, MD  ferrous sulfate 325 (65 FE) MG EC tablet Take 325 mg by mouth daily with breakfast.   Yes [provider]  fexofenadine (ALLEGRA) 180 MG tablet Take 180 mg by mouth daily as needed for allergies.  04/30/15  Yes [provider]  fluticasone (FLONASE) 50 MCG/ACT nasal spray Place 2 sprays into the nose daily as needed for allergies or rhinitis.    Yes [provider]  furosemide (LASIX) 80 MG tablet TAKE 1 TABLET BY MOUTH TWICE A DAY Patient taking differently: Take 40 mg by  mouth 2 (two) times daily. 01/10/21  Yes Leonie Man, MD  gabapentin (NEURONTIN) 600 MG tablet Take 600 mg by mouth daily.   Yes [provider]  Insulin Glargine 300 UNIT/ML SOPN Inject 40 Units into the skin 2 (two) times daily before a meal.    Yes [provider]  isosorbide mononitrate (IMDUR) 60 MG 24 hr tablet TAKE 1 TABLET BY MOUTH EVERY DAY Patient taking differently: Take 60 mg by mouth daily. 04/02/21  Yes Leonie Man, MD  LORazepam (ATIVAN) 1 MG tablet Take 1 mg by mouth 2 (two) times daily. 07/07/16  Yes [provider]  Multiple Vitamins-Minerals (MULTIVITAMIN WITH MINERALS) tablet Take 1 tablet by mouth daily.   Yes [provider]  nitroGLYCERIN (NITROSTAT) 0.4 MG SL tablet DISSOLVE 1 TABLET UNDER THE TONGUE EVERY 5 MINUTES AS NEEDED FOR CHEST PAIN. Patient taking differently: Place 0.4 mg under the tongue every 5 (five) minutes as needed for chest pain. 07/01/20  Yes Leonie Man, MD  pantoprazole (PROTONIX) 40 MG tablet TAKE 1 TABLET BY MOUTH EVERY DAY Patient taking differently: Take 40 mg by mouth daily. 04/02/21  Yes Leonie Man, MD  potassium chloride SA (KLOR-CON M20) 20 MEQ tablet TAKE 1 TABLET BY MOUTH TWICE A DAY MAY TAKE ADDITIONAL 2 TABLETS AS NEEDED/DIRECTED Patient taking differently: Take 20 mEq by mouth 2 (two) times daily. 04/22/21  Yes Leonie Man, MD  triamcinolone ointment (KENALOG) 0.5 % Apply to legs 2 times daily for 2 weeks Patient taking differently: Apply 1 application topically daily as needed (For rash). 05/02/18  Yes Leonie Man, MD  blood glucose meter kit and supplies KIT Dispense based on patient and insurance preference. Use up to four times daily as directed. (FOR ICD-9 250.00, 250.01). 04/22/18   Charlynne Cousins, MD  glucose blood test strip 1 each by Other route as needed for other. Use as instructed    [provider]  metolazone (ZAROXOLYN) 5 MG tablet TAKE 1 TABLET 30  MINUTES BEFORE FUROSEMIDE DAILY AS NEEDED Patient not taking: Reported on 06/20/2021 05/27/21   Leonie Man, MD  nebivolol (BYSTOLIC) 10 MG tablet TAKE 1 TABLET BY MOUTH EVERY DAY 06/23/21   Leonie Man, MD  rosuvastatin (CRESTOR) 40 MG tablet Take 1 tablet (40 mg total) by mouth daily. Patient not taking: Reported on 06/20/2021 05/14/21 08/12/21  Leonie Man, MD  spironolactone (ALDACTONE) 25 MG tablet Take 1 tablet (25 mg total) by mouth daily. Patient not taking: Reported on 06/20/2021 05/14/21 08/12/21  Leonie Man, MD    Past Medical History: Past Medical History:  Diagnosis Date   Aortic atherosclerosis [facility])    Arthritis    Asthma  Atrial fibrillation, permanent ([place])    Rate control with Bystolic. CHA2DS2Vasc = 6 (HTN, DM, CHF, Age 89, Female) -> on Pradaxa   Breast cancer ([place]) 2008   S/P mastectomy   CAD S/P percutaneous coronary angioplasty 2006;    PCI of circumflex with Taxus DES;; relook-cath Feb 2013: 50-60% short lesion in RCA, 40% ISR Circumflex stent.   CKD (chronic kidney disease), stage III (HCC)    Complication of anesthesia    prolonged sedation after colonoscopy in [place]   Diabetes mellitus, uncontrolled 07/14/2011   Dilated cardiomyopathy (HCC)    Mostly Resolved (EF up from ~25% to ~45% by Echo)   DOE (dyspnea on exertion)    Edema of both legs    Usually mild, chronic. Controlled with when necessary furosemide and diet   Fibroid, uterine 07/13/11   "have that now"   GERD (gastroesophageal reflux disease)    Gout    Hyperlipidemia    Hypertension    Hypokalemia    Morbid obesity ([place])    BMI 41   OSA on CPAP    On CPAP   Pulmonary hypertension, unspecified (HCC)    PAP ~90 mmHg on Echo 12/2017 -- has OSA on CPAP & obesity - but Overall Improved Sx of dyspnea / edema   Systolic murmur     Past Surgical History: Past Surgical History:  Procedure Laterality Date   COLONOSCOPY     CORONARY ANGIOPLASTY WITH STENT PLACEMENT  2006   stent to  circumflex   DILATION AND CURETTAGE OF UTERUS     LEFT HEART CATHETERIZATION WITH CORONARY ANGIOGRAM N/A 07/13/2011   Procedure: LEFT HEART CATHETERIZATION WITH CORONARY ANGIOGRAM;  Surgeon: Leonie Man, MD;  Location: [facility] CATH LAB;  Service: Cardiovascular;  normal EF; 84m 50-60% lesion in RCA; patent stent in circumflex form 2006 with ~40% in-stent re-stenosis   MASTECTOMY  2008   left   NM MYOVIEW LTD  7/'16; 6/'21   LOW RISK. No ischemia or infarction. Not gated 2/2  A. fib - unable to assess EF   RIGHT/LEFT HEART CATH AND CORONARY ANGIOGRAPHY N/A 06/23/2021   Procedure: RIGHT/LEFT HEART CATH AND CORONARY ANGIOGRAPHY;  Surgeon: BLorretta Harp MD;  [facility];  Service: Cardiovascular;  Laterality: N/A;   TOTAL HIP ARTHROPLASTY Right 01/19/2018   Procedure: RIGHT TOTAL HIP ARTHROPLASTY ANTERIOR APPROACH;  Surgeon: SRod Can MD;  Location: [facility] ORS;  Service: Orthopedics;  Laterality: Right;  Needs RNFA   TRANSTHORACIC ECHOCARDIOGRAM  6/'15; 9/'16   a) In setting of Urosepsis: EF ~25 %, global HK (poor quality study);; b) LV EF 40-45%, Mild Anteroseptal HK. Mod-Severe  RA dilation with elevated PA Pressures (~~peak 63 mmHg).. Mild LA dilation   TRANSTHORACIC ECHOCARDIOGRAM  12/2017; 04/15/2018   a) Improved LVEF to 45 to 50%.  Pulmonary HTN ~PAP ~ 60 mmHg. Severe LA dilation& mild /rv dilation.; b) Mod LVH. EF 40-45% (back to prior EF). Unable to assess DF (Afib). Severe R&L Atrial enlargement.  Moderate Post-Lateral directed MR with Mod RV dilation.  PAP ~66 mmHg.;    TRANSTHORACIC ECHOCARDIOGRAM  01/2019   relook Echo: EF back up to 50 to 55%.  Indeterminate diastolic parameters.  Moderately dilated left atrium and right atrium.  Moderate MR.  Mild to moderate TR.    Family History: Family History  Problem Relation Age of Onset   Coronary artery disease Mother    Hypertension Mother    Heart disease Father    Atrial fibrillation  Son    Lung cancer Sister     Heart disease Brother    Heart disease Brother    Healthy Son    Thyroid disease Daughter    Healthy Daughter    Healthy Daughter     Social History: Social History   Socioeconomic History   Marital status: Married    Spouse name: Not on file   Number of children: 4   Years of education: Not on file   Highest education level: Not on file  Occupational History   Not on file  Tobacco Use   Smoking status: Former    Packs/day: 0.50    Years: 6.00    Pack years: 3.00    Types: Cigarettes    Quit date: 05/18/1992    Years since quitting: 29.1   Smokeless tobacco: Never  Vaping Use   Vaping Use: Never used  Substance and Sexual Activity   Alcohol use: No    Comment: 07/13/11 "have drank occasionally; not now"   Drug use: No   Sexual activity: Not Currently    Birth control/protection: Surgical  Other Topics Concern   Not on file  Social History Narrative   She is a married mother of 49, grandmother 2. Usually accompanied by her husband. She does not work. She had been working on her exercise, but is no longer as active. Does not drink and does not smoke   Social Determinants of Health   Financial Resource Strain: Not on file  Food Insecurity: Not on file  Transportation Needs: Not on file  Physical Activity: Not on file  Stress: Not on file  Social Connections: Not on file    Allergies:  No Known Allergies  Objective:    Vital Signs:   Temp:  [98.1 F (36.7 C)-98.5 F (36.9 C)] 98.5 F (36.9 C) (02/06 0525) Pulse Rate:  [0-108] 85 (02/06 1346) Resp:  [16-30] 23 (02/06 1346) BP: (104-142)/(58-110) 135/65 (02/06 1346) SpO2:  [94 %-100 %] 98 % (02/06 1346) Weight:  [88.1 kg] 88.1 kg (02/06 0939) Last BM Date: 06/22/21  Weight change: Filed Weights   06/20/21 1331 06/20/21 1756 06/23/21 0939  Weight: 84.4 kg 83.8 kg 88.1 kg    Intake/Output:   Intake/Output Summary (Last 24 hours) at 06/23/2021 1422 Last data filed at 06/23/2021 0400 Gross per 24 hour   Intake 3305.08 ml  Output --  Net 3305.08 ml      Physical Exam    General:  No resp difficulty HEENT: normal Neck: supple. JVP to jaw . Carotids 2+ bilat; no bruits. No lymphadenopathy or thyromegaly appreciated. Cor: PMI nondisplaced. Irregular rate & rhythm. No rubs, gallops or murmurs. Lungs: clear Abdomen: soft, nontender, nondistended. No hepatosplenomegaly. No bruits or masses. Good bowel sounds. Extremities: no cyanosis, clubbing, rash, R and LLE 1+ edema Neuro: alert & orientedx3, cranial nerves grossly intact. moves all 4 extremities w/o difficulty. Affect pleasant   Telemetry   SR with frequent PVCs   EKG    N/A   Labs   Basic Metabolic Panel: Recent Labs  Lab 06/20/21 1329 06/21/21 0414 06/22/21 0941 06/23/21 0807  NA 137 137 138 137  K 2.8* 3.1* 3.5 3.7  CL 95* 98 105 104  CO2 27 26 24  21*  GLUCOSE 132* 122* 77 77  BUN 36* 39* 28* 25*  CREATININE 2.11* 2.07* 1.59* 1.47*  CALCIUM 10.4* 10.2 9.9 9.7    Liver Function Tests: No results for input(s): AST, ALT, ALKPHOS, BILITOT, PROT, ALBUMIN in  the last 168 hours. No results for input(s): LIPASE, AMYLASE in the last 168 hours. No results for input(s): AMMONIA in the last 168 hours.  CBC: Recent Labs  Lab 06/20/21 1329 06/21/21 0414 06/22/21 0259 06/22/21 0941 06/23/21 0807  WBC 12.8* 14.6* 10.0 11.2* 9.5  HGB 12.5 12.6 12.1 11.7* 11.1*  HCT 39.8 39.1 38.0 36.9 34.3*  MCV 92.6 90.9 92.7 91.3 91.7  PLT 231 229 178 199 180    Cardiac Enzymes: No results for input(s): CKTOTAL, CKMB, CKMBINDEX, TROPONINI in the last 168 hours.  BNP: BNP (last 3 results) No results for input(s): BNP in the last 8760 hours.  ProBNP (last 3 results) No results for input(s): PROBNP in the last 8760 hours.   CBG: Recent Labs  Lab 06/21/21 2133 06/22/21 0730 06/22/21 1708 06/22/21 2159 06/23/21 0715  GLUCAP 142* 75 92 186* 87    Coagulation Studies: Recent Labs    06/23/21 0807  LABPROT 15.1   INR 1.2     Imaging   CARDIAC CATHETERIZATION  Result Date: 06/23/2021 Images from the original result were not included.   Prox RCA to Mid RCA lesion is 60% stenosed.   Dist Cx lesion is 90% stenosed.   Previously placed Mid LAD stent (unknown type) is  widely patent. Stephanie Franco is a 71 y.o. female  [id number] LOCATION:  FACILITY: [place] PHYSICIAN: [name], M.D. [birth year] DATE OF PROCEDURE:  06/23/2021 DATE OF DISCHARGE: CARDIAC CATHETERIZATION History obtained from chart review.71 y.o. female with past medical history of permanent atrial fibrillation on Eliquis, CAD, history of systolic heart failure with improved EF, hypertension, hyperlipidemia, DM2 and morbid obesity who was seen 06/20/2021 for the evaluation of chest pain.  Ejection fraction had declined over the last 2 years down to 25 to 30%.  The enzymes were flat.  Patient was referred for right left heart cath to define anatomy and physiology. HEMODYNAMICS: 1: Right atrial pressure-22/21 2: Right ventricular pressure-70/1 3: Pulmonary artery pressure-80/30 4: Cardiac output-3.55 L/min with an index of 1.89 L/min/m 5: LVEDP-14    Stephanie. Franco has a patent LAD stent, severe disease in the distal circumflex before 3 small posterolateral branches and moderate segmental calcified mid dominant RCA stenosis.  I do not think these explain her LV dysfunction.  I reviewed the angiograms with Dr. Irish Lack, her attending cardiologist, and we both feel that she would benefit from medication optimization by the heart failure service.  She does not need coronary intervention.  The radial sheath was removed and a TR band was placed on the right wrist to achieve patent hemostasis.  The patient left lab in stable condition.  IV heparin will be restarted 4 hours after sheath removal without a bolus. [name]. MD, [facility] 06/23/2021 1:23 PM      Medications:     Current Medications:  aspirin EC  81 mg Oral Daily   ezetimibe  10 mg Oral Daily    gabapentin  600 mg Oral Daily   insulin glargine-yfgn  40 Units Subcutaneous BID   metoprolol succinate  25 mg Oral Daily   pantoprazole  40 mg Oral Daily   rosuvastatin  40 mg Oral Daily   sodium chloride flush  3 mL Intravenous Q12H    Infusions:  sodium chloride Stopped (06/23/21 0206)   sodium chloride 75 mL/hr at 06/23/21 1358   sodium chloride     heparin     nitroGLYCERIN        Patient Profile   Stephanie Franco  is a 41 year old with history of permanent A fib for 20 years, DMII, HTN, Hyperlipidemia, left breast cancer (has mastectomy 20 years ago), right hip replacement, arthritis, and CAD prior PCI OM in 2005.    Assessment/Plan   CAD -. Cath today. Prox RCA to Mid RCA lesion is 60% stenosed.  Dist Cx lesion is 90% stenosed. Previously placed Mid LAD stent (unknown type) is  widely patent - On eliquis + statin.   A/C HFrEF -Echo this admit EF 25% down from previous 50-55% in 2020. Cath today does not explain. RHC with ongoing volume and index 1.9.  - Add milrinone 0.25 mcg for now.  -On exam volume overloaded. Start 80 mg IV twice a day. Supp K  - Add digoxin 0.125 mg daily  - Hold off on bb and ARNi - Set up CMRI once diuresed.  - Place PICC. CVP and CO-OX once placed.   3. PVC -High PVC burden. Start amio 200 mg twice a day  - Sleep study negative.   4. CKD Stage III Creatinne baseline 1.4-1.6  5. Chronic A fib , > 20 years  -Rate controlled - On eliquis 5 mg twice a day   6. Obesity  Body mass index is 34.41 kg/m.   Length of Stay: 3  Amy Clegg, NP  06/23/2021, 2:22 PM  Advanced Heart Failure Team Pager [phone] (M-F; 7a - 5p)  Please contact [facility] for night-coverage after hours (4p -7a ) and weekends on [email or web address]  Patient seen with NP, agree with the above note.   History as outlined above.  She was admitted with chest pain but also notes orthopnea and worsening dyspnea.  Echo was done, showing EF 25-30%, moderate RV dysfunction,  moderate biatrial enlargement, at least moderate posteriorly-directed MR (may be worse). LHC/RHC done today, stable CAD with elevated R>L heart filling pressures and low cardiac index 1.89.  Creatinine today is 1.47, was 2.07 at admission.  Lasix has been held.   General: NAD Neck: JVP 14+, no thyromegaly or thyroid nodule.  Lungs: Clear to auscultation bilaterally with normal respiratory effort. CV: Nondisplaced PMI.  Heart irregular S1/S2, no S3/S4, 1/6 HSM apex.  No peripheral edema.  No carotid bruit.  Normal pedal pulses.  Abdomen: Soft, nontender, no hepatosplenomegaly, no distention.  Skin: Intact without lesions or rashes.  Neurologic: Alert and oriented x 3.  Psych: Normal affect. Extremities: No clubbing or cyanosis.  HEENT: Normal.   Assessment/Plan: 1. Chronic systolic CHF: ?Ischemic cardiomyopathy. Echo this admission with EF 25-30%, moderate RV dysfunction, moderate biatrial enlargement, at least moderate posteriorly-directed MR (may be worse).  Most recent prior echo showed EF 50-55% in 9/20, EF was 20-25% on 6/16 echo.  It is not clear why EF has dropped since 9/20 (cath does not explain).  Atrial fibrillation has been chronic x years. Frequent PVCs are noted.  RHC showed elevated R>L heart filling pressures with pulmonary arterial hypertension and low CI 1.89. On exam, she is volume overloaded.  Initially, creatinine up to 2.09 but now down to 1.47 with holding Lasix.  - With low output and need for diuresis, will start milrinone 0.25.  - Place PICC line to follow CVP and co-ox.  - Lasix 80 mg IV bid - Start digoxin 0.125 and hold Toprol XL for now.  - Add spironolactone 12.5 daily.  - Further GDMT as creatinine and BP tolerate.  - Plan for cardiac MRI to assess for infiltrative disease.  2. Atrial fibrillation: This appears permanent,  x years.   - Continue Eliquis 5 mg bid.  3. PVCs: Frequent.  ?Contributing to cardiomyopathy.  - Add amiodarone po, especially as we will be  using milrinone.  4. Mitral regurgitation: Suspect functional.  The MR is at least moderate but is eccentric, could be worse (severe).  - Would arrange for TEE after diuresis to assess severity of MR, may benefit from Mitraclip.  5. AKI on CKD stage 3: Creatinine up to 2.09 at admission, now back to 1.47 which appears to be baseline.  - Follow closely.  6. Pulmonary hypertension: PCWP not obtained, so with using LVEDP, PVR is 11 WU suggesting severe pulmonary arterial hypertension.  Etiology unclear.  Recent sleep study negative.   - Send serologic workup with ANA, anti-centromere ab, anti-scl70, RF - For completeness, will do V/Q scan though she has been on Eliquis.   Loralie Champagne 06/23/2021 3:31 PM

## 2021-06-24 ENCOUNTER — Inpatient Hospital Stay (HOSPITAL_COMMUNITY): Payer: Medicare Other

## 2021-06-24 ENCOUNTER — Other Ambulatory Visit (HOSPITAL_COMMUNITY): Payer: Self-pay

## 2021-06-24 DIAGNOSIS — I5021 Acute systolic (congestive) heart failure: Secondary | ICD-10-CM | POA: Diagnosis not present

## 2021-06-24 HISTORY — PX: OTHER SURGICAL HISTORY: SHX169

## 2021-06-24 LAB — BASIC METABOLIC PANEL
Anion gap: 9 (ref 5–15)
BUN: 32 mg/dL — ABNORMAL HIGH (ref 8–23)
CO2: 27 mmol/L (ref 22–32)
Calcium: 9.7 mg/dL (ref 8.9–10.3)
Chloride: 102 mmol/L (ref 98–111)
Creatinine, Ser: 1.54 mg/dL — ABNORMAL HIGH (ref 0.44–1.00)
GFR, Estimated: 36 mL/min — ABNORMAL LOW (ref >60)
Glucose, Bld: 132 mg/dL — ABNORMAL HIGH (ref 70–99)
Potassium: 3.7 mmol/L (ref 3.5–5.1)
Sodium: 138 mmol/L (ref 135–145)

## 2021-06-24 LAB — MAGNESIUM: Magnesium: 1.5 mg/dL — ABNORMAL LOW (ref 1.7–2.4)

## 2021-06-24 LAB — COOXEMETRY PANEL
Carboxyhemoglobin: 1.3 % (ref 0.5–1.5)
Methemoglobin: 1 % (ref 0.0–1.5)
O2 Saturation: 73.1 %
Total hemoglobin: 11.6 g/dL — ABNORMAL LOW (ref 12.0–16.0)

## 2021-06-24 LAB — HEMOGLOBIN A1C
Hgb A1c MFr Bld: 7.5 % — ABNORMAL HIGH (ref 4.8–5.6)
Mean Plasma Glucose: 168.55 mg/dL

## 2021-06-24 LAB — CBC
HCT: 33.9 % — ABNORMAL LOW (ref 36.0–46.0)
Hemoglobin: 10.7 g/dL — ABNORMAL LOW (ref 12.0–15.0)
MCH: 29.3 pg (ref 26.0–34.0)
MCHC: 31.6 g/dL (ref 30.0–36.0)
MCV: 92.9 fL (ref 80.0–100.0)
Platelets: 183 10*3/uL (ref 150–400)
RBC: 3.65 MIL/uL — ABNORMAL LOW (ref 3.87–5.11)
RDW: 16.9 % — ABNORMAL HIGH (ref 11.5–15.5)
WBC: 9.3 10*3/uL (ref 4.0–10.5)
nRBC: 0 % (ref 0.0–0.2)

## 2021-06-24 LAB — GLUCOSE, CAPILLARY
Glucose-Capillary: 143 mg/dL — ABNORMAL HIGH (ref 70–99)
Glucose-Capillary: 285 mg/dL — ABNORMAL HIGH (ref 70–99)
Glucose-Capillary: 147 mg/dL — ABNORMAL HIGH (ref 70–99)
Glucose-Capillary: 227 mg/dL — ABNORMAL HIGH (ref 70–99)

## 2021-06-24 LAB — CENTROMERE ANTIBODIES: Centromere Ab Screen: <0.2 AI (ref 0.0–0.9)

## 2021-06-24 LAB — ANA W/REFLEX IF POSITIVE: Anti Nuclear Antibody (ANA): NEGATIVE

## 2021-06-24 LAB — APTT: aPTT: 71 seconds — ABNORMAL HIGH (ref 24–36)

## 2021-06-24 LAB — ANTI-SCLERODERMA ANTIBODY: Scleroderma (Scl-70) (ENA) Antibody, IgG: <0.2 AI (ref 0.0–0.9)

## 2021-06-24 LAB — RHEUMATOID FACTOR: Rheumatoid fact SerPl-aCnc: <10 IU/mL (ref <14.0)

## 2021-06-24 MED ORDER — INSULIN ASPART 100 UNIT/ML IJ SOLN
0.0000 [IU] | Freq: Three times a day (TID) | INTRAMUSCULAR | Status: DC
  Administered 2021-06-24: 8 [IU] via SUBCUTANEOUS
  Administered 2021-06-24: 2 [IU] via SUBCUTANEOUS

## 2021-06-24 MED ORDER — DAPAGLIFLOZIN PROPANEDIOL 10 MG PO TABS
10.0000 mg | ORAL_TABLET | Freq: Every day | ORAL | Status: DC
  Administered 2021-06-24 – 2021-06-27 (×3): 10 mg via ORAL
  Filled 2021-06-24 (×4): qty 1

## 2021-06-24 MED ORDER — POTASSIUM CHLORIDE CRYS ER 20 MEQ PO TBCR
40.0000 meq | EXTENDED_RELEASE_TABLET | Freq: Once | ORAL | Status: AC
  Administered 2021-06-24: 40 meq via ORAL
  Filled 2021-06-24: qty 2

## 2021-06-24 MED ORDER — LOSARTAN POTASSIUM 25 MG PO TABS
12.5000 mg | ORAL_TABLET | Freq: Every day | ORAL | Status: DC
  Administered 2021-06-24 – 2021-06-27 (×4): 12.5 mg via ORAL
  Filled 2021-06-24 (×4): qty 1

## 2021-06-24 MED ORDER — TECHNETIUM TO 99M ALBUMIN AGGREGATED
4.1000 | Freq: Once | INTRAVENOUS | Status: AC | PRN
  Administered 2021-06-24: 4.1 via INTRAVENOUS

## 2021-06-24 MED ORDER — LORAZEPAM 1 MG PO TABS
1.0000 mg | ORAL_TABLET | Freq: Every day | ORAL | Status: DC
  Administered 2021-06-24 – 2021-06-26 (×3): 1 mg via ORAL
  Filled 2021-06-24 (×3): qty 1

## 2021-06-24 MED ORDER — SODIUM CHLORIDE 0.9 % IV SOLN: INTRAVENOUS | Status: DC

## 2021-06-24 MED FILL — Nitroglycerin IV Soln 100 MCG/ML in D5W: INTRA_ARTERIAL | Qty: 10 | Status: AC

## 2021-06-24 NOTE — Progress Notes (Signed)
Patient returned from V/Q scan at 1400hrs.

## 2021-06-24 NOTE — Care Management Important Message (Signed)
Important Message  Patient Details  Name: Stephanie Franco MRN: [id number] Date of Birth: 1950/07/21   Medicare Important Message Given:  Yes     Shelda Altes 06/24/2021, 9:35 AM

## 2021-06-24 NOTE — TOC Initial Note (Addendum)
Transition of Care [facility]) - Initial/Assessment Note    Patient Details  Name: Stephanie Franco MRN: [id number] Date of Birth: 09-Mar-1951  Transition of Care [facility]) CM/SW Contact:    Erenest Rasher, RN Phone Number: [phone] 06/24/2021, 5:56 PM  Clinical Narrative:                  HF TOC CM spoke to pt and husband at bedside. Pt states she had [facility] in the past for home, now [facility]. Pt/husband agreeable to HF HHRN. Will need orders for HF HHRN with F2F. Has RW with seat at home. Declined 3n1 bedside commode. States they will be working with PCP to get scooter. Benefits completed for meds, see pharmacy note. Pt had concerns that she may need oxygen at night when she goes home. Explained Unit RN will monitor her tonight. If oxygen drops will check with attending to see if overnight pulse oximetry can be set up. States she had sleep study in past and CPAP was not required.    The patient is currently admitted and upon discharge could be taking Farxiga 10 mg.   The current 30 day co-pay is, $24.00.    Expected Discharge Plan: [place] Barriers to Discharge: Continued Medical Work up   Patient Goals and CMS Choice Patient states their goals for this hospitalization and ongoing recovery are:: wants to stay active CMS Medicare.gov Compare Post Acute Care list provided to:: Patient Choice offered to / list presented to : Patient  Expected Discharge Plan and Services Expected Discharge Plan: [place]   Discharge Planning Services: CM Consult Post Acute Care Choice: [place] arrangements for the past 2 months: Apartment  HH Arranged: RN [place] Agency: [place] ([place]) Date [place]: 06/24/21 Time [place]: 1755 Representative spoke with at [place]: Corene Cornea  Prior Living Arrangements/Services Living arrangements for the past 2 months: Apartment Lives with:: Spouse Patient language and need  for interpreter reviewed:: Yes Do you feel safe going back to the place where you live?: Yes      Need for Family Participation in Patient Care: No (Comment) Care giver support system in place?: No (comment) Current home services: DME (rolling walker with seat) Criminal Activity/Legal Involvement Pertinent to Current Situation/Hospitalization: No - Comment as needed  Activities of Daily Living Home Assistive Devices/Equipment: None ADL Screening (condition at time of admission) Patient's cognitive ability adequate to safely complete daily activities?: Yes Is the patient deaf or have difficulty hearing?: No Does the patient have difficulty seeing, even when wearing glasses/contacts?: No Does the patient have difficulty concentrating, remembering, or making decisions?: No Patient able to express need for assistance with ADLs?: Yes Does the patient have difficulty dressing or bathing?: No Independently performs ADLs?: Yes (appropriate for developmental age) Does the patient have difficulty walking or climbing stairs?: No Weakness of Legs: None Weakness of Arms/Hands: None  Permission Sought/Granted Permission sought to share information with : Case Manager, Family Supports, PCP Permission granted to share information with : Yes, Verbal Permission Granted  Share Information with NAME: Alfredo Collymore     Permission granted to share info w Relationship: husband  Permission granted to share info w Contact Information: [phone]  Emotional Assessment Appearance:: Appears stated age Attitude/Demeanor/Rapport: Engaged Affect (typically observed): Accepting Orientation: : Oriented to Self, Oriented to Place, Oriented to  Time, Oriented to Situation   Psych Involvement: No (comment)  Admission diagnosis:  Unstable angina (HCC) [I20.0] Hypokalemia [E87.6] NSTEMI (non-ST elevated myocardial infarction) [facility]) [I21.4] Patient Active Problem List   Diagnosis Date Noted   NSTEMI (non-ST  elevated myocardial infarction) ([place]) 06/20/2021   Generalized joint pain 11/29/2019   Chest pain with moderate risk for cardiac etiology 10/12/2019   History of total hip arthroplasty 06/01/2018   Itching 05/02/2018   Acute on chronic combined systolic and diastolic CHF (congestive heart failure) ([place]) 04/16/2018   GERD (gastroesophageal reflux disease) 04/15/2018   Gout 04/15/2018   Hypomagnesemia 04/15/2018   Primary osteoarthritis of right hip 01/19/2018   Pulmonary hypertension ([place]) 01/06/2018   Moderate mitral regurgitation 12/10/2017   Preoperative cardiovascular examination 12/10/2017   Osteoarthritis of right hip 06/22/2017   Abnormal serum creatinine level 05/31/2017   Medication management 11/16/2016   Dilated cardiomyopathy ([place]) 01/29/2015   Chronic heart failure with preserved ejection fraction (HFpEF) (HCC)    Hypokalemia 10/31/2014   Pyelonephritis 10/31/2014   Abnormal liver function 10/31/2014   Chronic kidney disease (CKD), stage III (moderate) (HCC)    Cramps of lower extremity 06/24/2013   CAD S/P percutaneous coronary angioplasty    Permanent atrial fibrillation: CHA2DS2-VASc Score 6    Edema of lower extremity    Diabetes mellitus ([place]) 07/14/2011   Morbid obesity ([place]) 07/12/2011   Uterine leiomyoma 07/12/2011   Hyperlipidemia associated with type 2 diabetes mellitus ([place]) 07/12/2011   Obstructive sleep apnea syndrome 07/12/2011   Essential hypertension    PCP:  Lin Landsman, MD Pharmacy:   [id number] - [place], [place] [address] [place] [address] Phone: [phone] Fax: [place] [address] [place] [address] Phone: [phone] Fax: [phone]     Social Determinants of Health (SDOH) Interventions    Readmission Risk Interventions No flowsheet data found.

## 2021-06-24 NOTE — Progress Notes (Signed)
Patient taken to nuclear medicine at 1300hrs via bed with SWOT RN for V/Q scan.

## 2021-06-24 NOTE — Progress Notes (Addendum)
Advanced Heart Failure Rounding Note  PCP-Cardiologist: Glenetta Hew, MD   Subjective:    Yesterday started on milrinone and diuresed with IV lasix.  Negative 2.4 liters.   Feeling better today.    Objective:   Weight Range: 86.1 kg Body mass index is 33.62 kg/m.   Vital Signs:   Temp:  [97.5 F (36.4 C)-98.3 F (36.8 C)] 98.3 F (36.8 C) (02/07 0811) Pulse Rate:  [0-108] 75 (02/07 0811) Resp:  [18-30] 18 (02/07 0811) BP: (109-147)/(58-110) 116/69 (02/07 0811) SpO2:  [94 %-98 %] 97 % (02/07 0542) Weight:  [86.1 kg-88.1 kg] 86.1 kg (02/07 0542) Last BM Date: 06/22/21  Weight change: Filed Weights   06/20/21 1756 06/23/21 0939 06/24/21 0542  Weight: 83.8 kg 88.1 kg 86.1 kg    Intake/Output:   Intake/Output Summary (Last 24 hours) at 06/24/2021 0846 Last data filed at 06/24/2021 0825 Gross per 24 hour  Intake 679.02 ml  Output 3050 ml  Net -2370.98 ml      Physical Exam   CVP 10-11  General:  Well appearing. No resp difficulty HEENT: Normal Neck: Supple. JVP 10-11 . Carotids 2+ bilat; no bruits. No lymphadenopathy or thyromegaly appreciated. Cor: PMI nondisplaced. Regular rate & rhythm. No rubs, gallops or murmurs. Lungs: Clear Abdomen: Soft, nontender, nondistended. No hepatosplenomegaly. No bruits or masses. Good bowel sounds. Extremities: No cyanosis, clubbing, rash, edema Neuro: Alert & orientedx3, cranial nerves grossly intact. moves all 4 extremities w/o difficulty. Affect pleasant   Telemetry   A fib.   EKG    N/A  Labs    CBC Recent Labs    06/23/21 0807 06/23/21 1248 06/23/21 1255 06/24/21 0550  WBC 9.5  --   --  9.3  HGB 11.1*   < > 12.2   12.2 10.7*  HCT 34.3*   < > 36.0   36.0 33.9*  MCV 91.7  --   --  92.9  PLT 180  --   --  183   < > = values in this interval not displayed.   Basic Metabolic Panel Recent Labs    06/23/21 0807 06/23/21 1248 06/23/21 1255 06/24/21 0550  NA 137   < > 141   141 138  K 3.7   < > 3.7    3.7 3.7  CL 104  --   --  102  CO2 21*  --   --  27  GLUCOSE 77  --   --  132*  BUN 25*  --   --  32*  CREATININE 1.47*  --   --  1.54*  CALCIUM 9.7  --   --  9.7  MG  --   --   --  1.5*   < > = values in this interval not displayed.   Liver Function Tests No results for input(s): AST, ALT, ALKPHOS, BILITOT, PROT, ALBUMIN in the last 72 hours. No results for input(s): LIPASE, AMYLASE in the last 72 hours. Cardiac Enzymes No results for input(s): CKTOTAL, CKMB, CKMBINDEX, TROPONINI in the last 72 hours.  BNP: BNP (last 3 results) No results for input(s): BNP in the last 8760 hours.  ProBNP (last 3 results) No results for input(s): PROBNP in the last 8760 hours.   D-Dimer No results for input(s): DDIMER in the last 72 hours. Hemoglobin A1C No results for input(s): HGBA1C in the last 72 hours. Fasting Lipid Panel No results for input(s): CHOL, HDL, LDLCALC, TRIG, CHOLHDL, LDLDIRECT in the last 72 hours. Thyroid  Function Tests No results for input(s): TSH, T4TOTAL, T3FREE, THYROIDAB in the last 72 hours.  Invalid input(s): FREET3  Other results:   Imaging    CARDIAC CATHETERIZATION  Result Date: 06/23/2021 Images from the original result were not included.   Prox RCA to Mid RCA lesion is 60% stenosed.   Dist Cx lesion is 90% stenosed.   Previously placed Mid LAD stent (unknown type) is  widely patent. Stephanie Franco is a 71 y.o. female  [id number] LOCATION:  FACILITY: [facility] PHYSICIAN: [name], M.D. 03-20-1951 DATE OF PROCEDURE:  06/23/2021 DATE OF DISCHARGE: CARDIAC CATHETERIZATION History obtained from chart review.71 y.o. female with past medical history of permanent atrial fibrillation on Eliquis, CAD, history of systolic heart failure with improved EF, hypertension, hyperlipidemia, DM2 and morbid obesity who was seen 06/20/2021 for the evaluation of chest pain.  Ejection fraction had declined over the last 2 years down to 25 to 30%.  The enzymes were flat.  Patient was  referred for right left heart cath to define anatomy and physiology. HEMODYNAMICS: 1: Right atrial pressure-22/21 2: Right ventricular pressure-70/1 3: Pulmonary artery pressure-80/30 4: Cardiac output-3.55 L/min with an index of 1.89 L/min/m 5: LVEDP-14    Stephanie. Franco has a patent LAD stent, severe disease in the distal circumflex before 3 small posterolateral branches and moderate segmental calcified mid dominant RCA stenosis.  I do not think these explain her LV dysfunction.  I reviewed the angiograms with Dr. Irish Lack, her attending cardiologist, and we both feel that she would benefit from medication optimization by the heart failure service.  She does not need coronary intervention.  The radial sheath was removed and a TR band was placed on the right wrist to achieve patent hemostasis.  The patient left lab in stable condition.  IV heparin will be restarted 4 hours after sheath removal without a bolus. [name]. MD, [facility] 06/23/2021 1:23 PM    DG CHEST PORT 1 VIEW  Result Date: 06/23/2021 CLINICAL DATA:  New right PICC line. EXAM: PORTABLE CHEST 1 VIEW COMPARISON:  Chest x-ray 06/20/2021. FINDINGS: Right upper extremity PICC line terminates over the mid SVC. There is no pneumothorax. Cardiac silhouette is markedly enlarged and unchanged. There is no focal lung infiltrate, pleural effusion or pulmonary vascular congestion. There are calcified left mediastinal lymph nodes. No acute fractures are seen. IMPRESSION: 1. Right upper extremity PICC terminates over the mid SVC. 2. Stable marked cardiomegaly. Electronically Signed   By: Ronney Asters M.D.   On: 06/23/2021 18:36   [place] EKG SITE RITE  Result Date: 06/23/2021 If Site Rite image not attached, placement could not be confirmed due to current cardiac rhythm.    Medications:     Scheduled Medications:  amiodarone  200 mg Oral BID   apixaban  5 mg Oral BID   aspirin EC  81 mg Oral Daily   Chlorhexidine Gluconate Cloth  6 each Topical Daily    dapagliflozin propanediol  10 mg Oral Daily   digoxin  0.125 mg Oral Daily   ezetimibe  10 mg Oral Daily   furosemide  80 mg Intravenous BID   gabapentin  600 mg Oral Daily   insulin glargine-yfgn  40 Units Subcutaneous BID   losartan  12.5 mg Oral Daily   pantoprazole  40 mg Oral Daily   rosuvastatin  40 mg Oral Daily   sodium chloride flush  3 mL Intravenous Q12H   spironolactone  12.5 mg Oral Daily    Infusions:  sodium  chloride Stopped (06/23/21 0206)   sodium chloride     milrinone 0.25 mcg/kg/min (06/24/21 0825)   nitroGLYCERIN      PRN Medications: sodium chloride, acetaminophen, albuterol, albuterol, morphine injection, nitroGLYCERIN, ondansetron (ZOFRAN) IV, polyethylene glycol, sodium chloride flush    Patient Profile   Stephanie Franco is a 8 year old with history of permanent A fib for 20 years, DMII, HTN, Hyperlipidemia, left breast cancer (has mastectomy 20 years ago), right hip replacement, arthritis, and CAD prior PCI OM in 2005.     Assessment/Plan   1. Chronic systolic CHF: ?Ischemic cardiomyopathy. Echo this admission with EF 25-30%, moderate RV dysfunction, moderate biatrial enlargement, at least moderate posteriorly-directed MR (may be worse).  Most recent prior echo showed EF 50-55% in 9/20, EF was 20-25% on 6/16 echo.  It is not clear why EF has dropped since 9/20 (cath does not explain).  Atrial fibrillation has been chronic x years. Frequent PVCs are noted.  RHC showed elevated R>L heart filling pressures with pulmonary arterial hypertension and low CI 1.89.  - CO-OX improved on Milrinone.  - Volume status improved. CVP 10-11. One more day of IV lasix.  - Continue  digoxin 0.125 and hold Toprol XL for now.  - Continue spironolactone 12.5 daily.  - Start farxiga 10 mg daily  - Further GDMT as creatinine and BP tolerate.  - Plan for cardiac MRI to assess for infiltrative disease.  2. Atrial fibrillation: This appears permanent, x years.   - Continue Eliquis 5  mg bid.  3. PVCs: Frequent.  ?Contributing to cardiomyopathy.  - Added amiodarone po, especially as we will be using milrinone.  4. Mitral regurgitation: Suspect functional.  The MR is at least moderate but is eccentric, could be worse (severe).  - Will need  TEE after diuresis to assess severity of MR, may benefit from Mitraclip.  5. AKI on CKD stage 3: Creatinine up to 2.09 at admission, now back to 1.5 which appears to be baseline.  - Follow closely.  6. Pulmonary hypertension: PCWP not obtained, so with using LVEDP, PVR is 11 WU suggesting severe pulmonary arterial hypertension.  Etiology unclear.  Recent sleep study negative.   - Send serologic workup with ANA, anti-centromere ab, anti-scl70, RF (RF negative) - For completeness, will do V/Q scan though she has been on Eliquis.  7. CAD: Cath this admission with prox RCA to Mid RCA lesion 60% stenosis, distal LCx lesion 90% stenosis (similar to the past), previously placed mid LAD stent (unknown type) is widely patent.  Medical management.  - Continue statin.  - No ASA given Eliquis use.   MRI today.  Set up TEE    Length of Stay: 4  Amy Clegg, NP  06/24/2021, 8:46 AM  Advanced Heart Failure Team Pager [phone] (M-F; 7a - 5p)  Please contact [facility] for night-coverage after hours (5p -7a ) and weekends on [email or web address]  Patient seen with NP, agree with the above note.   She diuresed well, I/Os net negative -2411.  Weight trending down.  Creatinine relatively stable today at 1.54.  CVP 11-12, co-ox 73%. Breathing better.   General: NAD Neck: JVP 12 cm, no thyromegaly or thyroid nodule.  Lungs: Clear to auscultation bilaterally with normal respiratory effort. CV: Nondisplaced PMI.  Heart irregular S1/S2, no S3/S4, 1/6 HSM apex.  No peripheral edema.   Abdomen: Soft, nontender, no hepatosplenomegaly, no distention.  Skin: Intact without lesions or rashes.  Neurologic: Alert and oriented x 3.  Psych: Normal  affect. Extremities:  No clubbing or cyanosis.  HEENT: Normal.   Volume status improving, CVP 10-11 today.  Good co-ox on milrinone 0.25.  - Continue IV Lasix today, then likely to po.  - Will start weaning milrinone when off IV Lasix.  - Continue digoxin and current Toprol XL.  - Continue spironolactone 12.5 daily.  - Add losartan 12.5 daily.  - Add Farxiga 10 daily.  - Cardiac MRI for infiltrative disease as CAD does not explain fall in EF.  Frequent PVCs may contribute.   Now on amiodarone po for PVC suppression.   At least moderate, ?severe MR on echo this admission.   - Will arrange for TEE tomorrow, ?Mitraclip candidate.   Pulmonary hypertension, unclear etiology.  Appears to be pulmonary arterial hypertension.  Sent serologic workup and ordered V/Q scan for chronic PE.   Loralie Champagne 06/24/2021 11:41 AM

## 2021-06-25 ENCOUNTER — Inpatient Hospital Stay (HOSPITAL_COMMUNITY): Payer: Medicare Other | Admitting: Certified Registered Nurse Anesthetist

## 2021-06-25 ENCOUNTER — Encounter (HOSPITAL_COMMUNITY): Payer: Self-pay | Admitting: Cardiology

## 2021-06-25 ENCOUNTER — Inpatient Hospital Stay (HOSPITAL_COMMUNITY): Payer: Medicare Other

## 2021-06-25 ENCOUNTER — Encounter (HOSPITAL_COMMUNITY)
Admission: EM | Disposition: A | Discharge status: Home / Self Care | Payer: Self-pay | Source: Home / Self Care | Attending: Cardiology

## 2021-06-25 DIAGNOSIS — I5043 Acute on chronic combined systolic (congestive) and diastolic (congestive) heart failure: Secondary | ICD-10-CM

## 2021-06-25 DIAGNOSIS — I340 Nonrheumatic mitral (valve) insufficiency: Secondary | ICD-10-CM

## 2021-06-25 DIAGNOSIS — I214 Non-ST elevation (NSTEMI) myocardial infarction: Secondary | ICD-10-CM | POA: Diagnosis not present

## 2021-06-25 HISTORY — PX: TEE WITHOUT CARDIOVERSION: SHX5443

## 2021-06-25 LAB — COOXEMETRY PANEL
Carboxyhemoglobin: 1.3 % (ref 0.5–1.5)
Methemoglobin: 1.1 % (ref 0.0–1.5)
O2 Saturation: 67.8 %
Total hemoglobin: 12.1 g/dL (ref 12.0–16.0)

## 2021-06-25 LAB — CBC
HCT: 33.7 % — ABNORMAL LOW (ref 36.0–46.0)
Hemoglobin: 11.2 g/dL — ABNORMAL LOW (ref 12.0–15.0)
MCH: 29.9 pg (ref 26.0–34.0)
MCHC: 33.2 g/dL (ref 30.0–36.0)
MCV: 90.1 fL (ref 80.0–100.0)
Platelets: 186 10*3/uL (ref 150–400)
RBC: 3.74 MIL/uL — ABNORMAL LOW (ref 3.87–5.11)
RDW: 16.3 % — ABNORMAL HIGH (ref 11.5–15.5)
WBC: 9.2 10*3/uL (ref 4.0–10.5)
nRBC: 0 % (ref 0.0–0.2)

## 2021-06-25 LAB — GLUCOSE, CAPILLARY
Glucose-Capillary: 109 mg/dL — ABNORMAL HIGH (ref 70–99)
Glucose-Capillary: 86 mg/dL (ref 70–99)
Glucose-Capillary: 82 mg/dL (ref 70–99)
Glucose-Capillary: 240 mg/dL — ABNORMAL HIGH (ref 70–99)
Glucose-Capillary: 150 mg/dL — ABNORMAL HIGH (ref 70–99)

## 2021-06-25 LAB — BASIC METABOLIC PANEL
Anion gap: 11 (ref 5–15)
BUN: 30 mg/dL — ABNORMAL HIGH (ref 8–23)
CO2: 27 mmol/L (ref 22–32)
Calcium: 9.5 mg/dL (ref 8.9–10.3)
Chloride: 98 mmol/L (ref 98–111)
Creatinine, Ser: 1.89 mg/dL — ABNORMAL HIGH (ref 0.44–1.00)
GFR, Estimated: 28 mL/min — ABNORMAL LOW (ref >60)
Glucose, Bld: 97 mg/dL (ref 70–99)
Potassium: 3.4 mmol/L — ABNORMAL LOW (ref 3.5–5.1)
Sodium: 136 mmol/L (ref 135–145)

## 2021-06-25 SURGERY — ECHOCARDIOGRAM, TRANSESOPHAGEAL
Anesthesia: Monitor Anesthesia Care

## 2021-06-25 MED ORDER — INSULIN ASPART 100 UNIT/ML IJ SOLN
0.0000 [IU] | Freq: Three times a day (TID) | INTRAMUSCULAR | Status: DC
  Administered 2021-06-25: 5 [IU] via SUBCUTANEOUS
  Administered 2021-06-26: 3 [IU] via SUBCUTANEOUS
  Administered 2021-06-27: 2 [IU] via SUBCUTANEOUS

## 2021-06-25 MED ORDER — PHENYLEPHRINE HCL-NACL 20-0.9 MG/250ML-% IV SOLN
INTRAVENOUS | Status: DC | PRN
  Administered 2021-06-25: 25 ug/min via INTRAVENOUS

## 2021-06-25 MED ORDER — POTASSIUM CHLORIDE CRYS ER 20 MEQ PO TBCR
40.0000 meq | EXTENDED_RELEASE_TABLET | ORAL | Status: AC
  Administered 2021-06-25: 40 meq via ORAL
  Filled 2021-06-25: qty 2

## 2021-06-25 MED ORDER — INSULIN ASPART 100 UNIT/ML IJ SOLN
2.0000 [IU] | Freq: Three times a day (TID) | INTRAMUSCULAR | Status: DC
  Administered 2021-06-25 – 2021-06-27 (×5): 2 [IU] via SUBCUTANEOUS

## 2021-06-25 MED ORDER — PROPOFOL 500 MG/50ML IV EMUL
INTRAVENOUS | Status: DC | PRN
  Administered 2021-06-25: 75 ug/kg/min via INTRAVENOUS

## 2021-06-25 MED ORDER — SPIRONOLACTONE 25 MG PO TABS
25.0000 mg | ORAL_TABLET | Freq: Every day | ORAL | Status: DC
  Administered 2021-06-25 – 2021-06-27 (×3): 25 mg via ORAL
  Filled 2021-06-25 (×3): qty 1

## 2021-06-25 MED ORDER — PHENYLEPHRINE 40 MCG/ML (10ML) SYRINGE FOR IV PUSH (FOR BLOOD PRESSURE SUPPORT)
PREFILLED_SYRINGE | INTRAVENOUS | Status: DC | PRN
  Administered 2021-06-25: 80 ug via INTRAVENOUS
  Administered 2021-06-25 (×2): 120 ug via INTRAVENOUS
  Administered 2021-06-25: 80 ug via INTRAVENOUS

## 2021-06-25 MED ORDER — PROPOFOL 10 MG/ML IV BOLUS
INTRAVENOUS | Status: DC | PRN
  Administered 2021-06-25: 20 mg via INTRAVENOUS
  Administered 2021-06-25: 30 mg via INTRAVENOUS

## 2021-06-25 NOTE — Interval H&P Note (Signed)
History and Physical Interval Note:  06/25/2021 12:23 PM  Stephanie Franco  has presented today for surgery, with the diagnosis of mitral regurg.  The various methods of treatment have been discussed with the patient and family. After consideration of risks, benefits and other options for treatment, the patient has consented to  Procedure(s): TRANSESOPHAGEAL ECHOCARDIOGRAM (TEE) (N/A) as a surgical intervention.  The patient's history has been reviewed, patient examined, no change in status, stable for surgery.  I have reviewed the patient's chart and labs.  Questions were answered to the patient's satisfaction.     Jasmain Ahlberg [employer]

## 2021-06-25 NOTE — Progress Notes (Signed)
Patient ID: Stephanie Franco, female   DOB: 01/21/1951, 70 y.o.   MRN: [id number]     Advanced Heart Failure Rounding Note  PCP-Cardiologist: Glenetta Hew, MD   Subjective:    Diuresed again yesterday with IV Lasix. CVP 8-9 today, co-ox 68% on milrinone 0.25.  Creatinine higher at 1.89.   V/Q scan: No evidence for chronic PE.    Objective:   Weight Range: 85.4 kg Body mass index is 33.36 kg/m.   Vital Signs:   Temp:  [98 F (36.7 C)-98.5 F (36.9 C)] 98.5 F (36.9 C) (02/08 0800) Pulse Rate:  [75-82] 76 (02/08 0902) Resp:  [18-20] 18 (02/08 0800) BP: (109-124)/(47-82) 115/82 (02/08 0800) SpO2:  [94 %-100 %] 98 % (02/08 0800) Weight:  [85.4 kg] 85.4 kg (02/08 0537) Last BM Date: 06/24/21  Weight change: Filed Weights   06/23/21 0939 06/24/21 0542 06/25/21 0537  Weight: 88.1 kg 86.1 kg 85.4 kg    Intake/Output:   Intake/Output Summary (Last 24 hours) at 06/25/2021 1027 Last data filed at 06/25/2021 0931 Gross per 24 hour  Intake 511.18 ml  Output 1750 ml  Net -1238.82 ml      Physical Exam   CVP 8-9  General: NAD Neck: JVP 8 cm, no thyromegaly or thyroid nodule.  Lungs: Clear to auscultation bilaterally with normal respiratory effort. CV: Nondisplaced PMI.  Heart regular S1/S2, no S3/S4, 1/6 HSM apex.  No peripheral edema.   Abdomen: Soft, nontender, no hepatosplenomegaly, no distention.  Skin: Intact without lesions or rashes.  Neurologic: Alert and oriented x 3.  Psych: Normal affect. Extremities: No clubbing or cyanosis.  HEENT: Normal.    Telemetry   Atrial fibrillation, rate 80s (personally reviewed)  EKG    N/A  Labs    CBC Recent Labs    06/24/21 0550 06/25/21 0535  WBC 9.3 9.2  HGB 10.7* 11.2*  HCT 33.9* 33.7*  MCV 92.9 90.1  PLT 183 211   Basic Metabolic Panel Recent Labs    06/24/21 0550 06/25/21 0535  NA 138 136  K 3.7 3.4*  CL 102 98  CO2 27 27  GLUCOSE 132* 97  BUN 32* 30*  CREATININE 1.54* 1.89*  CALCIUM 9.7 9.5   MG 1.5*  --    Liver Function Tests No results for input(s): AST, ALT, ALKPHOS, BILITOT, PROT, ALBUMIN in the last 72 hours. No results for input(s): LIPASE, AMYLASE in the last 72 hours. Cardiac Enzymes No results for input(s): CKTOTAL, CKMB, CKMBINDEX, TROPONINI in the last 72 hours.  BNP: BNP (last 3 results) No results for input(s): BNP in the last 8760 hours.  ProBNP (last 3 results) No results for input(s): PROBNP in the last 8760 hours.   D-Dimer No results for input(s): DDIMER in the last 72 hours. Hemoglobin A1C Recent Labs    06/24/21 1156  HGBA1C 7.5*   Fasting Lipid Panel No results for input(s): CHOL, HDL, LDLCALC, TRIG, CHOLHDL, LDLDIRECT in the last 72 hours. Thyroid Function Tests No results for input(s): TSH, T4TOTAL, T3FREE, THYROIDAB in the last 72 hours.  Invalid input(s): FREET3  Other results:   Imaging    NM Pulmonary Perf and Vent  Result Date: 06/24/2021 CLINICAL DATA:  Elevated D-dimer levels. History of pulmonary hypertension. Pulmonary embolism suspected. EXAM: NUCLEAR MEDICINE PERFUSION LUNG SCAN TECHNIQUE: Perfusion images were obtained in multiple projections after intravenous injection of radiopharmaceutical. Ventilation scans intentionally deferred if perfusion scan and chest x-ray adequate for interpretation during COVID 19 epidemic. RADIOPHARMACEUTICALS:  4.1 mCi  Tc-21m MAA IV COMPARISON:  Chest radiographs 06/23/2021 and 06/20/2021. FINDINGS: No wedge-shaped perfusion defects are identified to suggest pulmonary embolism. The pulmonary perfusion is mildly heterogeneous. Oblique views demonstrate perihilar defects attributed to the pulmonary hila. The heart is enlarged. IMPRESSION: Perfusion-only study is low probability for acute pulmonary embolism. Consider correlation with lower extremity venous doppler ultrasound. Electronically Signed   By: Richardean Sale M.D.   On: 06/24/2021 14:17     Medications:     Scheduled Medications:   amiodarone  200 mg Oral BID   apixaban  5 mg Oral BID   aspirin EC  81 mg Oral Daily   Chlorhexidine Gluconate Cloth  6 each Topical Daily   dapagliflozin propanediol  10 mg Oral Daily   digoxin  0.125 mg Oral Daily   ezetimibe  10 mg Oral Daily   gabapentin  600 mg Oral Daily   insulin aspart  0-15 Units Subcutaneous TID WC   insulin glargine-yfgn  40 Units Subcutaneous BID   LORazepam  1 mg Oral QHS   losartan  12.5 mg Oral Daily   pantoprazole  40 mg Oral Daily   potassium chloride  40 mEq Oral Q4H   rosuvastatin  40 mg Oral Daily   sodium chloride flush  3 mL Intravenous Q12H   spironolactone  25 mg Oral Daily    Infusions:  sodium chloride Stopped (06/23/21 0206)   sodium chloride     sodium chloride 20 mL/hr at 06/25/21 0931   milrinone 0.25 mcg/kg/min (06/24/21 2326)    PRN Medications: sodium chloride, acetaminophen, albuterol, albuterol, morphine injection, nitroGLYCERIN, ondansetron (ZOFRAN) IV, polyethylene glycol, sodium chloride flush    Patient Profile   Stephanie Franco is a 52 year old with history of permanent A fib for 20 years, DMII, HTN, Hyperlipidemia, left breast cancer (has mastectomy 20 years ago), right hip replacement, arthritis, and CAD prior PCI OM in 2005.     Assessment/Plan   1. Chronic systolic CHF: ?Ischemic cardiomyopathy. Echo this admission with EF 25-30%, moderate RV dysfunction, moderate biatrial enlargement, at least moderate posteriorly-directed MR (may be worse).  Most recent prior echo showed EF 50-55% in 9/20, EF was 20-25% on 6/16 echo.  It is not clear why EF has dropped since 9/20 (cath does not explain).  Atrial fibrillation has been chronic x years. Frequent PVCs are noted.  RHC showed elevated R>L heart filling pressures with pulmonary arterial hypertension and low CI 1.89.  She is on milrinone 0.25 now with co-ox 68%.  CVP 8-9 today, creatinine up to 1.89.  - Stop IV Lasix, no diuretic today with rise in creatinine.  - Continue  milrinone 0.25, if creatinine stabilizes will start wean tomorrow.  - Continue  digoxin 0.125 and hold Toprol XL for now.  - Increase spironolactone to 25 mg daily.  - Continue Farxiga 10 mg daily  - Continue losartan 12.5 daily, no increase or switch to ARNI yet with rise in creatinine.   - Plan for cardiac MRI to assess for infiltrative disease.  2. Atrial fibrillation: This appears permanent, x years.   - Continue Eliquis 5 mg bid.  3. PVCs: Frequent.  ?Contributing to cardiomyopathy.  - Added amiodarone po, especially as we will be using milrinone.  4. Mitral regurgitation: Suspect functional.  The MR is at least moderate but is eccentric, could be worse (severe).  - Will need  TEE after diuresis to assess severity of MR, may benefit from Mitraclip => TEE today, discussed risks/benefits with patient  and she agreed to procedure.  5. AKI on CKD stage 3: Creatinine up to 2.09 at admission, now 1.89. - Hold Lasix today.  6. Pulmonary hypertension: PCWP not obtained, so with using LVEDP, PVR is 11 WU suggesting severe pulmonary arterial hypertension.  Etiology unclear.  Recent sleep study negative.  V/Q scan not suggestive of chronic PE.  Serologic workup negative (ANA, anti-centromere ab, anti-scl70, RF) - May be worthwhile repeating RHC down the road after diuresis and with plan to get a true PCWP.  7. CAD: Cath this admission with prox RCA to Mid RCA lesion 60% stenosis, distal LCx lesion 90% stenosis (similar to the past), previously placed mid LAD stent (unknown type) is widely patent.  Medical management. Not clear that CAD can explain her cardiomyopathy.  - Continue statin.  - No ASA given Eliquis use.   Loralie Champagne 06/25/2021 10:27 AM

## 2021-06-25 NOTE — Progress Notes (Addendum)
Inpatient Diabetes Program Recommendations  AACE/ADA: New Consensus Statement on Inpatient Glycemic Control (2015)  Target Ranges:  Prepandial:   less than 140 mg/dL      Peak postprandial:   less than 180 mg/dL (1-2 hours)      Critically ill patients:  140 - 180 mg/dL   Lab Results  Component Value Date   GLUCAP 109 (H) 06/25/2021   HGBA1C 7.5 (H) 06/24/2021    Review of Glycemic Control  Latest Reference Range & Units 06/24/21 07:35 06/24/21 11:29 06/24/21 16:12 06/24/21 20:45 06/25/21 07:21  Glucose-Capillary 70 - 99 mg/dL 143 (H) 285 (H) 147 (H) 227 (H) 109 (H)    Diabetes history: DM 2 Outpatient Diabetes medications:  Lantus 40 units bid Current orders for Inpatient glycemic control:  Semglee 40 units bid Novolog 0-15 units tid Farxiga 10 mg Daily  Inpatient Diabetes Program Recommendations:    -  Consider Novolog 2 units tid meal coverage if eating >50% of meals to prevent spikes after meal intake.  Tama Headings RN, MSN, BC-ADM Inpatient Diabetes Coordinator Team Pager [phone] (8a-5p)

## 2021-06-25 NOTE — Transfer of Care (Signed)
Immediate Anesthesia Transfer of Care Note  Patient: Stephanie Franco  Procedure(s) Performed: TRANSESOPHAGEAL ECHOCARDIOGRAM (TEE)  Patient Location: PACU  Anesthesia Type:MAC  Level of Consciousness: drowsy  Airway & Oxygen Therapy: Patient Spontanous Breathing  Post-op Assessment: Report given to RN and Post -op Vital signs reviewed and stable  Post vital signs: Reviewed and stable  Last Vitals:  Vitals Value Taken Time  BP 102/46 06/25/21 1259  Temp    Pulse 69 06/25/21 1300  Resp 22 06/25/21 1300  SpO2 97 % 06/25/21 1300  Vitals shown include unvalidated device data.  Last Pain:  Vitals:   06/25/21 1133  TempSrc: Oral  PainSc: 0-No pain      Patients Stated Pain Goal: 0 (67/70/34 0352)  Complications: No notable events documented.

## 2021-06-25 NOTE — Anesthesia Postprocedure Evaluation (Signed)
Anesthesia Post Note  Patient: Stephanie Franco  Procedure(s) Performed: TRANSESOPHAGEAL ECHOCARDIOGRAM (TEE)     Patient location during evaluation: PACU Anesthesia Type: MAC Level of consciousness: awake and alert Pain management: pain level controlled Vital Signs Assessment: post-procedure vital signs reviewed and stable Respiratory status: spontaneous breathing, nonlabored ventilation and respiratory function stable Cardiovascular status: blood pressure returned to baseline and stable Postop Assessment: no apparent nausea or vomiting Anesthetic complications: no   No notable events documented.  Last Vitals:  Vitals:   06/25/21 1133 06/25/21 1300  BP: (!) 132/57 (!) 102/46  Pulse: 78 69  Resp: (!) 22 (!) 22  Temp: 37.3 C 36.7 C  SpO2: 95% 97%    Last Pain:  Vitals:   06/25/21 1300  TempSrc:   PainSc: 0-No pain                 Pervis Hocking

## 2021-06-25 NOTE — CV Procedure (Signed)
Procedure: TEE  Sedation: Per anesthesiology  Indication: mitral regurgitation.  Findings: Please see echo section for full report.  Normal left ventricular size with global hypokinesis, EF 35-40%.  Normal right ventricular size with moderate systolic dysfunction.  Severe left atrial enlargement, no LA appendage thrombus.  Mild right atrial enlargement.  No PFO or ASD by color doppler.  Trileaflet aortic valve with trivial aortic insufficiency, no AS.  Mild TR.  There was restriction of the posterior mitral leaflet and moderate eccentric mitral regurgitation (posteriorly-directed).  Mean gradient 1 mmHg, PISA ERO 0.3 cm^2, 3-D vena contracta area 0.3 cm^2.  No systolic flow reversal in the pulmonary vein doppler pattern. Suspect functional MR, possibly atrial functional MR.  4.0 cm ascending aorta.   Impression: Moderate functional MR.   Stephanie Franco 06/25/2021 12:46 PM

## 2021-06-25 NOTE — Anesthesia Preprocedure Evaluation (Addendum)
Anesthesia Evaluation  Patient identified by MRN, date of birth, ID band Patient awake    Reviewed: Allergy & Precautions, NPO status , Patient's Chart, lab work & pertinent test results, reviewed documented beta blocker date and time   History of Anesthesia Complications (+) PROLONGED EMERGENCE and history of anesthetic complications  Airway Mallampati: III  TM Distance: >3 FB Neck ROM: Full    Dental  (+) Dental Advisory Given, Poor Dentition   Pulmonary asthma , sleep apnea (denies) , former smoker,  Quit smoking 1994, 3 pack year history    Pulmonary exam normal breath sounds clear to auscultation       Cardiovascular hypertension, Pt. on medications and Pt. on home beta blockers pulmonary hypertension (mild pHTN)+ CAD, + Past MI, +CHF (LVEF 25-30%, moderate RV failure) and + DOE  Normal cardiovascular exam+ dysrhythmias Atrial Fibrillation + Valvular Problems/Murmurs  Rhythm:Regular Rate:Normal  Echo 2023: 1. Left ventricular ejection fraction, by estimation, is 25 to 30%. The  left ventricle has severely decreased function. The left ventricle  demonstrates regional wall motion abnormalities (see scoring  diagram/findings for description). Left ventricular  diastolic parameters are indeterminate.  2. Right ventricular systolic function is moderately reduced. The right  ventricular size is normal. There is mildly elevated pulmonary artery  systolic pressure. The estimated right ventricular systolic pressure is  16.0 mmHg.  3. Left atrial size was severely dilated.  4. Right atrial size was moderately dilated.  5. The mitral valve is abnormal. Restricted posterior leaflet. Moderate  mitral valve regurgitation, eccentric and posteriorly directed.  6. The aortic valve is tricuspid. Aortic valve regurgitation is not  visualized. No aortic stenosis is present. Aortic valve mean gradient  measures 4.0 mmHg.  7. The  inferior vena cava is dilated in size with >50% respiratory  variability, suggesting right atrial pressure of 8 mmHg.    Neuro/Psych negative neurological ROS  negative psych ROS   GI/Hepatic Neg liver ROS, GERD  Controlled,  Endo/Other  diabetes, Type 2, Insulin DependentBMI 33  Renal/GU Renal InsufficiencyRenal disease1.89  negative genitourinary   Musculoskeletal  (+) Arthritis , Osteoarthritis,    Abdominal (+) + obese,   Peds negative pediatric ROS (+)  Hematology  (+) Blood dyscrasia, anemia , Hb 11.2   Anesthesia Other Findings   Reproductive/Obstetrics negative OB ROS                            Anesthesia Physical Anesthesia Plan  ASA: 4  Anesthesia Plan: MAC   Post-op Pain Management:    Induction:   PONV Risk Score and Plan: 2 and Propofol infusion and TIVA  Airway Management Planned: Natural Airway and Simple Face Mask  Additional Equipment: None  Intra-op Plan:   Post-operative Plan:   Informed Consent: I have reviewed the patients History and Physical, chart, labs and discussed the procedure including the risks, benefits and alternatives for the proposed anesthesia with the patient or authorized representative who has indicated his/her understanding and acceptance.       Plan Discussed with: CRNA  Anesthesia Plan Comments:         Anesthesia Quick Evaluation

## 2021-06-25 NOTE — H&P (View-Only) (Signed)
Patient ID: Stephanie Franco, female   DOB: 14-Sep-1950, 71 y.o.   MRN: [id number]     Advanced Heart Failure Rounding Note  PCP-Cardiologist: Glenetta Hew, MD   Subjective:    Diuresed again yesterday with IV Lasix. CVP 8-9 today, co-ox 68% on milrinone 0.25.  Creatinine higher at 1.89.   V/Q scan: No evidence for chronic PE.    Objective:   Weight Range: 85.4 kg Body mass index is 33.36 kg/m.   Vital Signs:   Temp:  [98 F (36.7 C)-98.5 F (36.9 C)] 98.5 F (36.9 C) (02/08 0800) Pulse Rate:  [75-82] 76 (02/08 0902) Resp:  [18-20] 18 (02/08 0800) BP: (109-124)/(47-82) 115/82 (02/08 0800) SpO2:  [94 %-100 %] 98 % (02/08 0800) Weight:  [85.4 kg] 85.4 kg (02/08 0537) Last BM Date: 06/24/21  Weight change: Filed Weights   06/23/21 0939 06/24/21 0542 06/25/21 0537  Weight: 88.1 kg 86.1 kg 85.4 kg    Intake/Output:   Intake/Output Summary (Last 24 hours) at 06/25/2021 1027 Last data filed at 06/25/2021 0931 Gross per 24 hour  Intake 511.18 ml  Output 1750 ml  Net -1238.82 ml      Physical Exam   CVP 8-9  General: NAD Neck: JVP 8 cm, no thyromegaly or thyroid nodule.  Lungs: Clear to auscultation bilaterally with normal respiratory effort. CV: Nondisplaced PMI.  Heart regular S1/S2, no S3/S4, 1/6 HSM apex.  No peripheral edema.   Abdomen: Soft, nontender, no hepatosplenomegaly, no distention.  Skin: Intact without lesions or rashes.  Neurologic: Alert and oriented x 3.  Psych: Normal affect. Extremities: No clubbing or cyanosis.  HEENT: Normal.    Telemetry   Atrial fibrillation, rate 80s (personally reviewed)  EKG    N/A  Labs    CBC Recent Labs    06/24/21 0550 06/25/21 0535  WBC 9.3 9.2  HGB 10.7* 11.2*  HCT 33.9* 33.7*  MCV 92.9 90.1  PLT 183 295   Basic Metabolic Panel Recent Labs    06/24/21 0550 06/25/21 0535  NA 138 136  K 3.7 3.4*  CL 102 98  CO2 27 27  GLUCOSE 132* 97  BUN 32* 30*  CREATININE 1.54* 1.89*  CALCIUM 9.7 9.5   MG 1.5*  --    Liver Function Tests No results for input(s): AST, ALT, ALKPHOS, BILITOT, PROT, ALBUMIN in the last 72 hours. No results for input(s): LIPASE, AMYLASE in the last 72 hours. Cardiac Enzymes No results for input(s): CKTOTAL, CKMB, CKMBINDEX, TROPONINI in the last 72 hours.  BNP: BNP (last 3 results) No results for input(s): BNP in the last 8760 hours.  ProBNP (last 3 results) No results for input(s): PROBNP in the last 8760 hours.   D-Dimer No results for input(s): DDIMER in the last 72 hours. Hemoglobin A1C Recent Labs    06/24/21 1156  HGBA1C 7.5*   Fasting Lipid Panel No results for input(s): CHOL, HDL, LDLCALC, TRIG, CHOLHDL, LDLDIRECT in the last 72 hours. Thyroid Function Tests No results for input(s): TSH, T4TOTAL, T3FREE, THYROIDAB in the last 72 hours.  Invalid input(s): FREET3  Other results:   Imaging    NM Pulmonary Perf and Vent  Result Date: 06/24/2021 CLINICAL DATA:  Elevated D-dimer levels. History of pulmonary hypertension. Pulmonary embolism suspected. EXAM: NUCLEAR MEDICINE PERFUSION LUNG SCAN TECHNIQUE: Perfusion images were obtained in multiple projections after intravenous injection of radiopharmaceutical. Ventilation scans intentionally deferred if perfusion scan and chest x-ray adequate for interpretation during COVID 19 epidemic. RADIOPHARMACEUTICALS:  4.1 mCi  Tc-106m MAA IV COMPARISON:  Chest radiographs 06/23/2021 and 06/20/2021. FINDINGS: No wedge-shaped perfusion defects are identified to suggest pulmonary embolism. The pulmonary perfusion is mildly heterogeneous. Oblique views demonstrate perihilar defects attributed to the pulmonary hila. The heart is enlarged. IMPRESSION: Perfusion-only study is low probability for acute pulmonary embolism. Consider correlation with lower extremity venous doppler ultrasound. Electronically Signed   By: Richardean Sale M.D.   On: 06/24/2021 14:17     Medications:     Scheduled Medications:   amiodarone  200 mg Oral BID   apixaban  5 mg Oral BID   aspirin EC  81 mg Oral Daily   Chlorhexidine Gluconate Cloth  6 each Topical Daily   dapagliflozin propanediol  10 mg Oral Daily   digoxin  0.125 mg Oral Daily   ezetimibe  10 mg Oral Daily   gabapentin  600 mg Oral Daily   insulin aspart  0-15 Units Subcutaneous TID WC   insulin glargine-yfgn  40 Units Subcutaneous BID   LORazepam  1 mg Oral QHS   losartan  12.5 mg Oral Daily   pantoprazole  40 mg Oral Daily   potassium chloride  40 mEq Oral Q4H   rosuvastatin  40 mg Oral Daily   sodium chloride flush  3 mL Intravenous Q12H   spironolactone  25 mg Oral Daily    Infusions:  sodium chloride Stopped (06/23/21 0206)   sodium chloride     sodium chloride 20 mL/hr at 06/25/21 0931   milrinone 0.25 mcg/kg/min (06/24/21 2326)    PRN Medications: sodium chloride, acetaminophen, albuterol, albuterol, morphine injection, nitroGLYCERIN, ondansetron (ZOFRAN) IV, polyethylene glycol, sodium chloride flush    Patient Profile   Stephanie Franco is a 71 year old with history of permanent A fib for 20 years, DMII, HTN, Hyperlipidemia, left breast cancer (has mastectomy 20 years ago), right hip replacement, arthritis, and CAD prior PCI OM in 2005.     Assessment/Plan   1. Chronic systolic CHF: ?Ischemic cardiomyopathy. Echo this admission with EF 25-30%, moderate RV dysfunction, moderate biatrial enlargement, at least moderate posteriorly-directed MR (may be worse).  Most recent prior echo showed EF 50-55% in 9/20, EF was 20-25% on 6/16 echo.  It is not clear why EF has dropped since 9/20 (cath does not explain).  Atrial fibrillation has been chronic x years. Frequent PVCs are noted.  RHC showed elevated R>L heart filling pressures with pulmonary arterial hypertension and low CI 1.89.  She is on milrinone 0.25 now with co-ox 68%.  CVP 8-9 today, creatinine up to 1.89.  - Stop IV Lasix, no diuretic today with rise in creatinine.  - Continue  milrinone 0.25, if creatinine stabilizes will start wean tomorrow.  - Continue  digoxin 0.125 and hold Toprol XL for now.  - Increase spironolactone to 25 mg daily.  - Continue Farxiga 10 mg daily  - Continue losartan 12.5 daily, no increase or switch to ARNI yet with rise in creatinine.   - Plan for cardiac MRI to assess for infiltrative disease.  2. Atrial fibrillation: This appears permanent, x years.   - Continue Eliquis 5 mg bid.  3. PVCs: Frequent.  ?Contributing to cardiomyopathy.  - Added amiodarone po, especially as we will be using milrinone.  4. Mitral regurgitation: Suspect functional.  The MR is at least moderate but is eccentric, could be worse (severe).  - Will need  TEE after diuresis to assess severity of MR, may benefit from Mitraclip => TEE today, discussed risks/benefits with patient  and she agreed to procedure.  5. AKI on CKD stage 3: Creatinine up to 2.09 at admission, now 1.89. - Hold Lasix today.  6. Pulmonary hypertension: PCWP not obtained, so with using LVEDP, PVR is 11 WU suggesting severe pulmonary arterial hypertension.  Etiology unclear.  Recent sleep study negative.  V/Q scan not suggestive of chronic PE.  Serologic workup negative (ANA, anti-centromere ab, anti-scl70, RF) - May be worthwhile repeating RHC down the road after diuresis and with plan to get a true PCWP.  7. CAD: Cath this admission with prox RCA to Mid RCA lesion 60% stenosis, distal LCx lesion 90% stenosis (similar to the past), previously placed mid LAD stent (unknown type) is widely patent.  Medical management. Not clear that CAD can explain her cardiomyopathy.  - Continue statin.  - No ASA given Eliquis use.   Loralie Champagne 06/25/2021 10:27 AM

## 2021-06-25 NOTE — Anesthesia Procedure Notes (Signed)
Procedure Name: MAC Date/Time: 06/25/2021 12:07 PM Performed by: Carolan Clines, CRNA Pre-anesthesia Checklist: Patient identified, Emergency Drugs available, Suction available and Patient being monitored Patient Re-evaluated:Patient Re-evaluated prior to induction Oxygen Delivery Method: Nasal cannula Dental Injury: Teeth and Oropharynx as per pre-operative assessment

## 2021-06-25 NOTE — Progress Notes (Signed)
°  Echocardiogram Echocardiogram Transesophageal has been performed.  Bobbye Charleston 06/25/2021, 12:52 PM

## 2021-06-26 ENCOUNTER — Inpatient Hospital Stay (HOSPITAL_COMMUNITY): Payer: Medicare Other

## 2021-06-26 ENCOUNTER — Encounter (HOSPITAL_COMMUNITY): Payer: Self-pay | Admitting: Cardiology

## 2021-06-26 DIAGNOSIS — I5043 Acute on chronic combined systolic (congestive) and diastolic (congestive) heart failure: Secondary | ICD-10-CM | POA: Diagnosis not present

## 2021-06-26 DIAGNOSIS — I5022 Chronic systolic (congestive) heart failure: Secondary | ICD-10-CM

## 2021-06-26 LAB — COOXEMETRY PANEL
Carboxyhemoglobin: 1.4 % (ref 0.5–1.5)
Carboxyhemoglobin: 1.6 % — ABNORMAL HIGH (ref 0.5–1.5)
Methemoglobin: 1.2 % (ref 0.0–1.5)
Methemoglobin: 0.7 % (ref 0.0–1.5)
O2 Saturation: 74.9 %
O2 Saturation: 68.2 %
Total hemoglobin: 10.7 g/dL — ABNORMAL LOW (ref 12.0–16.0)
Total hemoglobin: 10.9 g/dL — ABNORMAL LOW (ref 12.0–16.0)

## 2021-06-26 LAB — BASIC METABOLIC PANEL
Anion gap: 8 (ref 5–15)
BUN: 28 mg/dL — ABNORMAL HIGH (ref 8–23)
CO2: 25 mmol/L (ref 22–32)
Calcium: 9.2 mg/dL (ref 8.9–10.3)
Chloride: 102 mmol/L (ref 98–111)
Creatinine, Ser: 1.87 mg/dL — ABNORMAL HIGH (ref 0.44–1.00)
GFR, Estimated: 28 mL/min — ABNORMAL LOW (ref >60)
Glucose, Bld: 189 mg/dL — ABNORMAL HIGH (ref 70–99)
Potassium: 4 mmol/L (ref 3.5–5.1)
Sodium: 135 mmol/L (ref 135–145)

## 2021-06-26 LAB — GLUCOSE, CAPILLARY
Glucose-Capillary: 153 mg/dL — ABNORMAL HIGH (ref 70–99)
Glucose-Capillary: 98 mg/dL (ref 70–99)
Glucose-Capillary: 138 mg/dL — ABNORMAL HIGH (ref 70–99)
Glucose-Capillary: 182 mg/dL — ABNORMAL HIGH (ref 70–99)

## 2021-06-26 LAB — MAGNESIUM: Magnesium: 1.2 mg/dL — ABNORMAL LOW (ref 1.7–2.4)

## 2021-06-26 MED ORDER — MAGNESIUM SULFATE 2 GM/50ML IV SOLN
2.0000 g | Freq: Once | INTRAVENOUS | Status: AC
  Administered 2021-06-26: 2 g via INTRAVENOUS
  Filled 2021-06-26: qty 50

## 2021-06-26 MED ORDER — TORSEMIDE 20 MG PO TABS
40.0000 mg | ORAL_TABLET | Freq: Every day | ORAL | Status: DC
  Administered 2021-06-26 – 2021-06-27 (×2): 40 mg via ORAL
  Filled 2021-06-26 (×2): qty 2

## 2021-06-26 MED ORDER — MAGNESIUM SULFATE 4 GM/100ML IV SOLN
4.0000 g | Freq: Once | INTRAVENOUS | Status: AC
  Administered 2021-06-26: 4 g via INTRAVENOUS
  Filled 2021-06-26: qty 100

## 2021-06-26 MED ORDER — GADOBUTROL 1 MMOL/ML IV SOLN
10.0000 mL | Freq: Once | INTRAVENOUS | Status: AC | PRN
  Administered 2021-06-26: 10 mL via INTRAVENOUS

## 2021-06-26 NOTE — Progress Notes (Addendum)
Patient ID: Stephanie Franco, female   DOB: April 18, 1951, 71 y.o.   MRN: [id number]     Advanced Heart Failure Rounding Note  PCP-Cardiologist: Glenetta Hew, MD   Subjective:    Remains on milrinone 0.25. Co-ox 75%.   Diuretics held yesterday w/ bump in Scr. Wt not charted yet (RN to check), CVP 7-8. Scr unchanged, 1.54>>1.89>>1.87 today.   cMRI ordered.   TEE 2/8 showed restriction of the posterior mitral leaflet and moderate eccentric mitral regurgitation (posteriorly-directed). Normal LV size w/ global HK EF 35-40%, RV size normal w/ moderate systolic dysfunction, severe LAE. Suspect functional MR, possibly atrial functional MR  V/Q scan: No evidence for chronic PE.   Feels fine today. No current resting dyspnea.    Objective:   Weight Range: 85.4 kg Body mass index is 33.35 kg/m.   Vital Signs:   Temp:  [97 F (36.1 C)-99.1 F (37.3 C)] 98.5 F (36.9 C) (02/09 0654) Pulse Rate:  [69-104] 83 (02/09 0654) Resp:  [18-24] 20 (02/09 0654) BP: (97-132)/(46-82) 97/60 (02/09 0654) SpO2:  [79 %-100 %] 100 % (02/09 0654) Weight:  [85.4 kg] 85.4 kg (02/08 1133) Last BM Date: 06/25/21  Weight change: Filed Weights   06/24/21 0542 06/25/21 0537 06/25/21 1133  Weight: 86.1 kg 85.4 kg 85.4 kg    Intake/Output:   Intake/Output Summary (Last 24 hours) at 06/26/2021 0720 Last data filed at 06/26/2021 0601 Gross per 24 hour  Intake 625.39 ml  Output 252 ml  Net 373.39 ml      Physical Exam   CVP 7-8  General:  Well appearing. No respiratory difficulty HEENT: normal Neck: supple. Thick neck, JVD not well visualized. Carotids 2+ bilat; no bruits. No lymphadenopathy or thyromegaly appreciated. Cor: PMI nondisplaced. Irregularly irregular rhythm and rate. 1/6 HSM at apex  Lungs: clear Abdomen: soft, nontender, nondistended. No hepatosplenomegaly. No bruits or masses. Good bowel sounds. Extremities: no cyanosis, clubbing, rash, edema Neuro: alert & oriented x 3, cranial nerves  grossly intact. moves all 4 extremities w/o difficulty. Affect pleasant.   Telemetry   Atrial fibrillation, rate 80s (personally reviewed)  EKG    N/A  Labs    CBC Recent Labs    06/24/21 0550 06/25/21 0535  WBC 9.3 9.2  HGB 10.7* 11.2*  HCT 33.9* 33.7*  MCV 92.9 90.1  PLT 183 001   Basic Metabolic Panel Recent Labs    06/24/21 0550 06/25/21 0535 06/26/21 0500  NA 138 136 135  K 3.7 3.4* 4.0  CL 102 98 102  CO2 27 27 25   GLUCOSE 132* 97 189*  BUN 32* 30* 28*  CREATININE 1.54* 1.89* 1.87*  CALCIUM 9.7 9.5 9.2  MG 1.5*  --  1.2*   Liver Function Tests No results for input(s): AST, ALT, ALKPHOS, BILITOT, PROT, ALBUMIN in the last 72 hours. No results for input(s): LIPASE, AMYLASE in the last 72 hours. Cardiac Enzymes No results for input(s): CKTOTAL, CKMB, CKMBINDEX, TROPONINI in the last 72 hours.  BNP: BNP (last 3 results) No results for input(s): BNP in the last 8760 hours.  ProBNP (last 3 results) No results for input(s): PROBNP in the last 8760 hours.   D-Dimer No results for input(s): DDIMER in the last 72 hours. Hemoglobin A1C Recent Labs    06/24/21 1156  HGBA1C 7.5*   Fasting Lipid Panel No results for input(s): CHOL, HDL, LDLCALC, TRIG, CHOLHDL, LDLDIRECT in the last 72 hours. Thyroid Function Tests No results for input(s): TSH, T4TOTAL, T3FREE, THYROIDAB in  the last 72 hours.  Invalid input(s): FREET3  Other results:   Imaging    No results found.   Medications:     Scheduled Medications:  amiodarone  200 mg Oral BID   apixaban  5 mg Oral BID   Chlorhexidine Gluconate Cloth  6 each Topical Daily   dapagliflozin propanediol  10 mg Oral Daily   digoxin  0.125 mg Oral Daily   ezetimibe  10 mg Oral Daily   gabapentin  600 mg Oral Daily   insulin aspart  0-15 Units Subcutaneous TID WC   insulin aspart  2 Units Subcutaneous TID WC   insulin glargine-yfgn  40 Units Subcutaneous BID   LORazepam  1 mg Oral QHS   losartan   12.5 mg Oral Daily   pantoprazole  40 mg Oral Daily   rosuvastatin  40 mg Oral Daily   sodium chloride flush  3 mL Intravenous Q12H   spironolactone  25 mg Oral Daily    Infusions:  sodium chloride Stopped (06/23/21 0206)   sodium chloride     milrinone 0.25 mcg/kg/min (06/26/21 0601)    PRN Medications: sodium chloride, acetaminophen, albuterol, albuterol, morphine injection, nitroGLYCERIN, ondansetron (ZOFRAN) IV, polyethylene glycol, sodium chloride flush    Patient Profile   Stephanie Franco is a 8 year old with history of permanent A fib for 20 years, DMII, HTN, Hyperlipidemia, left breast cancer (has mastectomy 20 years ago), right hip replacement, arthritis, and CAD prior PCI OM in 2005.     Assessment/Plan   1. Chronic systolic CHF: ?Ischemic cardiomyopathy. Echo this admission with EF 25-30%, moderate RV dysfunction, moderate biatrial enlargement, at least moderate posteriorly-directed MR (may be worse).  Most recent prior echo showed EF 50-55% in 9/20, EF was 20-25% on 6/16 echo.  It is not clear why EF has dropped since 9/20 (cath does not explain).  Atrial fibrillation has been chronic x years. Frequent PVCs are noted.  RHC showed elevated R>L heart filling pressures with pulmonary arterial hypertension and low CI 1.89.  She is on milrinone 0.25 now with co-ox 75%.  CVP 7-8 today, creatinine up to 1.87 (stable past 24 hr).  - Start milrinone wean, reduce to 0.125 mcg/kg/min - Continue digoxin 0.125 and hold Toprol XL for now.  - Continue spironolactone 25 mg daily.  - Continue Farxiga 10 mg daily  - Continue losartan 12.5 daily, no increase or switch to ARNI yet with rise in creatinine.   - Transition to PO diuretic today - Plan for cardiac MRI to assess for infiltrative disease.  2. Atrial fibrillation: This appears permanent, x years.   - Continue Eliquis 5 mg bid.  3. PVCs: Frequent.  ?Contributing to cardiomyopathy.  - Added amiodarone po, especially as we will be using  milrinone.  4. Mitral regurgitation: TEE 2/8 showed restriction of the posterior mitral leaflet and moderate eccentric mitral regurgitation (posteriorly-directed). Normal LV size w/ global HK EF 35-40%, RV size normal w/ moderate systolic dysfunction, severe LAE. Suspect functional MR, possibly atrial functional MR - Will need monitoring, in progression to severe may need  MitraClip  5. AKI on CKD stage 3: Creatinine up to 2.09 at admission, now 1.87. - follow w/ milrinone wean  6. Pulmonary hypertension: PCWP not obtained, so with using LVEDP, PVR is 11 WU suggesting severe pulmonary arterial hypertension.  Etiology unclear.  Recent sleep study negative.  V/Q scan not suggestive of chronic PE.  Serologic workup negative (ANA, anti-centromere ab, anti-scl70, RF) - May be worthwhile  repeating RHC down the road after diuresis and with plan to get a true PCWP.  7. CAD: Cath this admission with prox RCA to Mid RCA lesion 60% stenosis, distal LCx lesion 90% stenosis (similar to the past), previously placed mid LAD stent (unknown type) is widely patent.  Medical management. Not clear that CAD can explain her cardiomyopathy.  - Continue statin.  - No ASA given Eliquis use.   Lyda Jester, PA-C  06/26/2021 7:20 AM  Patient seen with PA, agree with the above note.   No complaints, feels good. CVP around 8, co-ox 75% on milrinone 0.25.  Creatinine stable 1.87.   General: NAD Neck: JVP 7-8 cm, no thyromegaly or thyroid nodule.  Lungs: Clear to auscultation bilaterally with normal respiratory effort. CV: Nondisplaced PMI.  Heart regular S1/S2, no S3/S4, no murmur.  No peripheral edema.   Abdomen: Soft, nontender, no hepatosplenomegaly, no distention.  Skin: Intact without lesions or rashes.  Neurologic: Alert and oriented x 3.  Psych: Normal affect. Extremities: No clubbing or cyanosis.  HEENT: Normal.   Patient is near-euvolemic, creatinine has stabilized. TEE yesterday with EF 35-40% range.   - Decrease milrinone to 0.125, recheck co-ox this afternoon and stop milrinone if stable.  - Continue current Farxiga, losartan 12.5, and spironolactone 25.  Will not increase losartan/transition to [facility] with elevated creatinine.  - Start torsemide 40 mg daily after cardiac MRI.  - Cardiac MRI today.   TEE yesterday showed moderate functional mitral regurgitation.   Walk in halls.   Loralie Champagne 06/26/2021 8:41 AM

## 2021-06-26 NOTE — Progress Notes (Signed)
Left with transport to take pt to cardiac MRI.

## 2021-06-27 ENCOUNTER — Other Ambulatory Visit (HOSPITAL_COMMUNITY): Payer: Self-pay

## 2021-06-27 ENCOUNTER — Encounter (HOSPITAL_COMMUNITY): Payer: Medicare Other

## 2021-06-27 DIAGNOSIS — I5043 Acute on chronic combined systolic (congestive) and diastolic (congestive) heart failure: Secondary | ICD-10-CM | POA: Diagnosis not present

## 2021-06-27 DIAGNOSIS — I214 Non-ST elevation (NSTEMI) myocardial infarction: Secondary | ICD-10-CM | POA: Diagnosis not present

## 2021-06-27 LAB — GLUCOSE, CAPILLARY
Glucose-Capillary: 129 mg/dL — ABNORMAL HIGH (ref 70–99)
Glucose-Capillary: 111 mg/dL — ABNORMAL HIGH (ref 70–99)

## 2021-06-27 LAB — BASIC METABOLIC PANEL
Anion gap: 10 (ref 5–15)
BUN: 28 mg/dL — ABNORMAL HIGH (ref 8–23)
CO2: 27 mmol/L (ref 22–32)
Calcium: 9.8 mg/dL (ref 8.9–10.3)
Chloride: 101 mmol/L (ref 98–111)
Creatinine, Ser: 1.97 mg/dL — ABNORMAL HIGH (ref 0.44–1.00)
GFR, Estimated: 27 mL/min — ABNORMAL LOW (ref >60)
Glucose, Bld: 136 mg/dL — ABNORMAL HIGH (ref 70–99)
Potassium: 4 mmol/L (ref 3.5–5.1)
Sodium: 138 mmol/L (ref 135–145)

## 2021-06-27 LAB — MAGNESIUM: Magnesium: 2.6 mg/dL — ABNORMAL HIGH (ref 1.7–2.4)

## 2021-06-27 LAB — COOXEMETRY PANEL
Carboxyhemoglobin: 1.1 % (ref 0.5–1.5)
Methemoglobin: 1.1 % (ref 0.0–1.5)
O2 Saturation: 58.4 %
Total hemoglobin: 12 g/dL (ref 12.0–16.0)

## 2021-06-27 MED ORDER — AMIODARONE HCL 200 MG PO TABS
ORAL_TABLET | ORAL | 3 refills | Status: DC
  Filled 2021-06-27: qty 45, 35d supply, fill #0

## 2021-06-27 MED ORDER — ISOSORB DINITRATE-HYDRALAZINE 20-37.5 MG PO TABS
0.5000 | ORAL_TABLET | Freq: Three times a day (TID) | ORAL | 5 refills | Status: DC
  Filled 2021-06-27: qty 45, 30d supply, fill #0

## 2021-06-27 MED ORDER — ISOSORBIDE MONONITRATE ER 60 MG PO TB24: 30.0000 mg | ORAL_TABLET | Freq: Every day | ORAL | 1 refills | Status: DC

## 2021-06-27 MED ORDER — EZETIMIBE 10 MG PO TABS
10.0000 mg | ORAL_TABLET | Freq: Every day | ORAL | 5 refills | Status: DC
  Filled 2021-06-27: qty 30, 30d supply, fill #0

## 2021-06-27 MED ORDER — ISOSORB DINITRATE-HYDRALAZINE 20-37.5 MG PO TABS: 0.5000 | ORAL_TABLET | Freq: Three times a day (TID) | ORAL | Status: DC

## 2021-06-27 MED ORDER — ISOSORB DINITRATE-HYDRALAZINE 20-37.5 MG PO TABS: 1.0000 | ORAL_TABLET | Freq: Three times a day (TID) | ORAL | Status: DC

## 2021-06-27 MED ORDER — DIGOXIN 125 MCG PO TABS
0.1250 mg | ORAL_TABLET | Freq: Every day | ORAL | 5 refills | Status: DC
  Filled 2021-06-27: qty 30, 30d supply, fill #0

## 2021-06-27 MED ORDER — DAPAGLIFLOZIN PROPANEDIOL 10 MG PO TABS
10.0000 mg | ORAL_TABLET | Freq: Every day | ORAL | 5 refills | Status: DC
  Filled 2021-06-27: qty 30, 30d supply, fill #0

## 2021-06-27 MED ORDER — LOSARTAN POTASSIUM 25 MG PO TABS
12.5000 mg | ORAL_TABLET | Freq: Every day | ORAL | 5 refills | Status: DC
  Filled 2021-06-27: qty 30, 60d supply, fill #0

## 2021-06-27 MED ORDER — TORSEMIDE 20 MG PO TABS
40.0000 mg | ORAL_TABLET | Freq: Every day | ORAL | 5 refills | Status: DC
  Filled 2021-06-27: qty 60, 30d supply, fill #0

## 2021-06-27 MED ORDER — SPIRONOLACTONE 25 MG PO TABS
25.0000 mg | ORAL_TABLET | Freq: Every day | ORAL | 5 refills | Status: DC
  Filled 2021-06-27: qty 30, 30d supply, fill #0

## 2021-06-27 MED ORDER — HYDRALAZINE HCL 25 MG PO TABS
25.0000 mg | ORAL_TABLET | Freq: Three times a day (TID) | ORAL | 6 refills | Status: DC
  Filled 2021-06-27: qty 90, 30d supply, fill #0

## 2021-06-27 MED ORDER — ISOSORBIDE MONONITRATE ER 30 MG PO TB24
30.0000 mg | ORAL_TABLET | Freq: Every day | ORAL | 6 refills | Status: DC
Start: 2021-06-27 — End: 2021-07-04
  Filled 2021-06-27: qty 30, 30d supply, fill #0

## 2021-06-27 NOTE — TOC Benefit Eligibility Note (Signed)
Patient Advocate Encounter   Received notification hat prior authorization for  isosorbid-hydralazine (Bidil) 20-37.5 mg is required.   PA submitted on 06/27/2021 Key [id number] Status is pending       Lyndel Safe, [place] Patient Advocate Specialist [place] Patient Advocate Team Direct Number: [phone]  Fax: [phone]

## 2021-06-27 NOTE — Care Management Important Message (Signed)
Important Message  Patient Details  Name: Stephanie Franco MRN: [id number] Date of Birth: Sep 21, 1950   Medicare Important Message Given:  Yes     Shelda Altes 06/27/2021, 10:56 AM

## 2021-06-27 NOTE — Progress Notes (Addendum)
Patient ID: Stephanie Franco, female   DOB: 1950/10/12, 71 y.o.   MRN: [id number]     Advanced Heart Failure Rounding Note  PCP-Cardiologist: Glenetta Hew, MD   Subjective:    Milrinone discontinued 2/9.  Co-ox 68>>58% today   Back on PO diuretics, torsemide resumed yesterday, SCr 1.89>>1.87>>1.97  Wt down 1 lb. CVP 10-11  cMRI: LVEF 36%, RVEF 21%. Moderate MR. Myocardial LGE in septum is in a noncoronary pattern, ?myocarditis. T1 indices and ECV elevated in the septum as well though T2 not significantly elevated. Myocardial LGE in the anterior wall is subendocardial, may be due to prior MI.  TEE 2/8 showed restriction of the posterior mitral leaflet and moderate eccentric mitral regurgitation (posteriorly-directed). Normal LV size w/ global HK EF 35-40%, RV size normal w/ moderate systolic dysfunction, severe LAE. Suspect functional MR, possibly atrial functional MR  V/Q scan: No evidence for chronic PE.   She reports feeling ok. Denies dyspnea. Asking if she can go home.   Objective:   Weight Range: 85.8 kg Body mass index is 33.52 kg/m.   Vital Signs:   Temp:  [97.5 F (36.4 C)-98 F (36.7 C)] 97.5 F (36.4 C) (02/10 0405) Pulse Rate:  [59-84] 59 (02/10 0405) Resp:  [18-24] 20 (02/10 0405) BP: (129-153)/(63-95) 129/63 (02/10 0405) SpO2:  [98 %-99 %] 99 % (02/10 0405) Weight:  [85.8 kg] 85.8 kg (02/10 0416) Last BM Date: 06/26/21  Weight change: Filed Weights   06/25/21 1133 06/26/21 0800 06/27/21 0416  Weight: 85.4 kg 86.6 kg 85.8 kg    Intake/Output:   Intake/Output Summary (Last 24 hours) at 06/27/2021 0849 Last data filed at 06/27/2021 0720 Gross per 24 hour  Intake 250 ml  Output 600 ml  Net -350 ml      Physical Exam   CVP 10-11  General:  Well appearing. No respiratory difficulty HEENT: normal Neck: supple. Thick neck, JVD not well visualized. Carotids 2+ bilat; no bruits. No lymphadenopathy or thyromegaly appreciated. Cor: PMI nondisplaced.  Regular rate & rhythm. No rubs, gallops or murmurs. Lungs: clear Abdomen: soft, nontender, nondistended. No hepatosplenomegaly. No bruits or masses. Good bowel sounds. Extremities: no cyanosis, clubbing, rash, edema Neuro: alert & oriented x 3, cranial nerves grossly intact. moves all 4 extremities w/o difficulty. Affect pleasant.   Telemetry   Atrial fibrillation, rate 70ss (personally reviewed)  EKG    N/A  Labs    CBC Recent Labs    06/25/21 0535  WBC 9.2  HGB 11.2*  HCT 33.7*  MCV 90.1  PLT 660   Basic Metabolic Panel Recent Labs    06/26/21 0500 06/27/21 0446  NA 135 138  K 4.0 4.0  CL 102 101  CO2 25 27  GLUCOSE 189* 136*  BUN 28* 28*  CREATININE 1.87* 1.97*  CALCIUM 9.2 9.8  MG 1.2* 2.6*   Liver Function Tests No results for input(s): AST, ALT, ALKPHOS, BILITOT, PROT, ALBUMIN in the last 72 hours. No results for input(s): LIPASE, AMYLASE in the last 72 hours. Cardiac Enzymes No results for input(s): CKTOTAL, CKMB, CKMBINDEX, TROPONINI in the last 72 hours.  BNP: BNP (last 3 results) No results for input(s): BNP in the last 8760 hours.  ProBNP (last 3 results) No results for input(s): PROBNP in the last 8760 hours.   D-Dimer No results for input(s): DDIMER in the last 72 hours. Hemoglobin A1C Recent Labs    06/24/21 1156  HGBA1C 7.5*   Fasting Lipid Panel No results for input(s): CHOL,  HDL, LDLCALC, TRIG, CHOLHDL, LDLDIRECT in the last 72 hours. Thyroid Function Tests No results for input(s): TSH, T4TOTAL, T3FREE, THYROIDAB in the last 72 hours.  Invalid input(s): FREET3  Other results:   Imaging    MR CARDIAC MORPHOLOGY W WO CONTRAST  Result Date: 06/26/2021 CLINICAL DATA:  Cardiomyopathy of uncertain etiology EXAM: CARDIAC MRI TECHNIQUE: The patient was scanned on a 1.5 Tesla GE magnet. A dedicated cardiac coil was used. Functional imaging was done using Fiesta sequences. 2,3, and 4 chamber views were done to assess for RWMA's.  Modified Simpson's rule using a short axis stack was used to calculate an ejection fraction on a dedicated work Conservation officer, nature. The patient received 8.5 cc of Gadavist. After 10 minutes inversion recovery sequences were used to assess for infiltration and scar tissue. CONTRAST:  Gadavist 8.5 cc FINDINGS: Limited images of the lung fields showed no gross abnormalities. Normal left ventricular size and wall thickness. Moderate diffuse hypokinesis, EF 36%. Normal right ventricular size with EF 32%. Severe left and right atrial enlargement. Trileaflet aortic valve, no significant regurgitation or stenosis noted. At least mild tricuspid regurgitation. Moderate mitral regurgitation, regurgitant fraction 21%. Delayed enhancement imaging: Mid-wall late gadolinium enhancement (LGE) in the basal to mid septal wall, including the anteroseptal and inferoseptal RV insertion sites. <50% wall thickness subendocardial LGE in the basal to mid anterior wall. MEASUREMENTS: MEASUREMENTS LVEDV 187 mL LVSV 67 mL LVEF 36% RVEDV 147 mL RVSV 47 mL RVEF 32% Aortic forward volume 53 mL T1 1106, ECV 40% septum T2 51 in septum IMPRESSION: 1.  Normal LV size with diffuse hypokinesis, EF 36%. 2.  Normal RV size with moderately decreased RV function, EF 32%. 3.  Moderate mitral regurgitation, regurgitant fraction 21%. 4. Myocardial LGE in septum is in a noncoronary pattern, ?myocarditis. T1 indices and ECV elevated in the septum as well though T2 not significantly elevated. Myocardial LGE in the anterior wall is subendocardial, may be due to prior MI. Izayah Miner Electronically Signed   By: Loralie Champagne M.D.   On: 06/26/2021 14:50     Medications:     Scheduled Medications:  amiodarone  200 mg Oral BID   apixaban  5 mg Oral BID   Chlorhexidine Gluconate Cloth  6 each Topical Daily   dapagliflozin propanediol  10 mg Oral Daily   digoxin  0.125 mg Oral Daily   ezetimibe  10 mg Oral Daily   gabapentin  600 mg Oral  Daily   insulin aspart  0-15 Units Subcutaneous TID WC   insulin aspart  2 Units Subcutaneous TID WC   insulin glargine-yfgn  40 Units Subcutaneous BID   LORazepam  1 mg Oral QHS   losartan  12.5 mg Oral Daily   pantoprazole  40 mg Oral Daily   rosuvastatin  40 mg Oral Daily   sodium chloride flush  3 mL Intravenous Q12H   spironolactone  25 mg Oral Daily   torsemide  40 mg Oral Daily    Infusions:  sodium chloride Stopped (06/23/21 0206)   sodium chloride      PRN Medications: sodium chloride, acetaminophen, albuterol, albuterol, morphine injection, nitroGLYCERIN, ondansetron (ZOFRAN) IV, polyethylene glycol, sodium chloride flush    Patient Profile   Ms Mcmahen is a 71 year old with history of permanent A fib for 20 years, DMII, HTN, Hyperlipidemia, left breast cancer (has mastectomy 20 years ago), right hip replacement, arthritis, and CAD prior PCI OM in 2005.     Assessment/Plan  1. Chronic systolic CHF: ?Ischemic cardiomyopathy. Echo this admission with EF 25-30%, moderate RV dysfunction, moderate biatrial enlargement, at least moderate posteriorly-directed MR (may be worse).  Most recent prior echo showed EF 50-55% in 9/20, EF was 20-25% on 6/16 echo.  It is not clear why EF has dropped since 9/20 (cath does not explain).  Atrial fibrillation has been chronic x years. Frequent PVCs are noted.  RHC showed elevated R>L heart filling pressures with pulmonary arterial hypertension and low CI 1.89, started on milrinone. Milrinone stopped 2/9. Co-ox 68>>58% today. SCr trending back up 1.87>>1.97. CVP 10-11. Cardiac MRI w/  Myocardial LGE in septum is in a noncoronary pattern, ?myocarditis, anterior wall LGE is subendocardial and may be due to prior MI => ?mixed ischemic/nonischemic cardiomyopathy.  - Continue to follow co-ox off milrinone - Continue digoxin 0.125 and hold Toprol XL for now.  - Continue spironolactone 25 mg daily.  - Continue Farxiga 10 mg daily  - Continue losartan  12.5 daily, no increase or switch to ARNI yet with rise in creatinine.   - Continue torsemide 40 mg daily  2. Atrial fibrillation: This appears permanent, x years.   - Continue Eliquis 5 mg bid.  3. PVCs: Frequent.  ?Contributing to cardiomyopathy.  - Added amiodarone po => she can go home on amiodarone 200 mg bid x 10 more days then 200 mg daily.  4. Mitral regurgitation: TEE 2/8 showed restriction of the posterior mitral leaflet and moderate eccentric mitral regurgitation (posteriorly-directed). Normal LV size w/ global HK EF 35-40%, RV size normal w/ moderate systolic dysfunction, severe LAE. Suspect functional MR, possibly atrial functional MR.  Cardiac MRI with moderate MR.  - Will need monitoring, if progression to severe may need  MitraClip  5. AKI on CKD stage 3: Creatinine up to 2.09 at admission, now 1.97. - follow co-ox off milrinone  6. Pulmonary hypertension: PCWP not obtained, so with using LVEDP, PVR is 11 WU suggesting severe pulmonary arterial hypertension.  Etiology unclear.  Recent sleep study negative.  V/Q scan not suggestive of chronic PE.  Serologic workup negative (ANA, anti-centromere ab, anti-scl70, RF) - May be worthwhile repeating RHC down the road after diuresis and with plan to get a true PCWP.  7. CAD: Cath this admission with prox RCA to Mid RCA lesion 60% stenosis, distal LCx lesion 90% stenosis (similar to the past), previously placed mid LAD stent (unknown type) is widely patent.  Medical management. Not clear that CAD can explain her cardiomyopathy.  - Continue statin.  - No ASA given Eliquis use.   Lyda Jester, PA-C  06/27/2021 8:49 AM  Patient seen with PA, agree with the above note.   Co-ox 58%, CVP 10-11 off milrinone.  Creatinine 1.87 => 1.97.  Few PVCs on telemetry.    She feels good and would like to go home.   General: NAD Neck: JVP 8 cm, no thyromegaly or thyroid nodule.  Lungs: Clear to auscultation bilaterally with normal respiratory  effort. CV: Nondisplaced PMI.  Heart regular S1/S2, no S3/S4, 1/6 HSM apex.  No peripheral edema.   Abdomen: Soft, nontender, no hepatosplenomegaly, no distention.  Skin: Intact without lesions or rashes.  Neurologic: Alert and oriented x 3.  Psych: Normal affect. Extremities: No clubbing or cyanosis.  HEENT: Normal.   Co-ox relatively stable off milrinone, will leave off.  Mild volume overload, but with rise in creatinine, I am going to leave her on torsemide 40 mg daily and aim for gentle diuresis over time.  Will not titrate current meds up today with rise in creatinine but if BP remains high after she takes her morning meds, could add Bidil.   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    Pulmonary hypertension on RHC earlier this admission will need further workup down the road.  Would favor repeating RHC as outpatient to see if she truly has pulmonary arterial hypertension (did not obtain true PCWP on RHC this admission).   I think she can go home today.  However, she will need BMET and followup next week.  Meds for discharge: torsemide 40 mg daily, losartan 12.5 daily, spironolactone 25 daily, amiodarone 200 mg bid x 10 days then 200 mg daily, apixaban 5 mg bid, dapagliflozin 10 mg daily, digoxin 0.125 daily, Zetia 10 daily, Crestor 40 daily.   Loralie Champagne 06/27/2021 11:05 AM

## 2021-06-27 NOTE — Discharge Summary (Addendum)
Advanced Heart Failure Team  Discharge Summary   Patient ID: Stephanie Franco MRN: [id number], DOB/AGE: 73-21-1952 73 y.o. Admit date: 06/20/2021 D/C date:     06/27/2021   Primary Discharge Diagnoses:  Acute on Chronic Systolic Heart Failure Pulmonary Hypertension  Moderate Mitral Regurgitation  CAD  AKI on Stage III CKD Frequent PVCs Permanent Atrial Fibrillation  Hypertension  Obesity     Hospital Course:   [name] is a 28 year old with history of permanent A fib for 20 years, DMII, HTN, Hyperlipidemia, left breast cancer (has mastectomy 20 years ago), right hip replacement, arthritis, and CAD prior PCI OM in 2005. Had sleep study 05/2021- negative for sleep apnea.  She was admitted 06/20/21 with chest pain but also noted orthopnea and worsening dyspnea. HS Trop 71>58>51. Echo was done, showing EF 25-30%, moderate RV dysfunction, moderate biatrial enlargement, at least moderate posteriorly-directed MR. LHC/RHC showed stable CAD with elevated R>L heart filling pressures and low cardiac index 1.89.  AHF team consulted.   She was started on milrinone and PICC line was placed to follow Co-ox and CVP. Started on IV Lasix. Also placed on amiodarone given frequent PVCs. She diuresed well w/ IV Lasix w/ improvement in volume status. Underwent further w/u of PH and HF. V/Q scan was not suggestive of chronic PE.  Serologic workup negative (ANA, anti-centromere ab, anti-scl70, RF).  TEE was done to better assess severity of her MR. This showed restriction of the posterior mitral leaflet and moderate eccentric mitral regurgitation (posteriorly-directed). Normal LV size w/ global HK EF 35-40%, RV size normal w/ moderate systolic dysfunction, severe LAE. Suspect functional MR, possibly atrial functional MR.  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  LVEF on MRI 36%. MR moderate.   She was weaned off of milrinone  w/ stable co-ox and transition to PO diuretics, torsemide 40 mg daily. GDMT added. SCr stabilized ~1.9.   On 2/10, she was seen and examined by Dr. Aundra Dubin and felt stable for discharge home. She will f/u in the [facility] in 1 week. Will need BMP at post hospital f/u. Home Health PT and RN arranged.   Note: Pulmonary hypertension on RHC earlier this admission will need further workup down the road.  Would favor repeating RHC as outpatient to see if she truly has pulmonary arterial hypertension (did not obtain true PCWP on RHC this admission).  Meds for discharge: torsemide 40 mg daily, losartan 12.5 daily, spironolactone 25 daily, amiodarone 200 mg bid x 10 days then 200 mg daily, apixaban 5 mg bid, dapagliflozin 10 mg daily, digoxin 0.125 daily, Zetia 10 daily, Crestor 40 daily.     Discharge Weight Range: 189 lb  Discharge Vitals: Blood pressure (!) 152/95, pulse 63, temperature 98 F (36.7 C), temperature source Oral, resp. rate (!) 24, height $RemoveBe'5\' 3"'zHdFOGVbd$  (1.6 m), weight 85.8 kg, SpO2 99 %.  Labs: Lab Results  Component Value Date   WBC 9.2 06/25/2021   HGB 11.2 (L) 06/25/2021   HCT 33.7 (L) 06/25/2021   MCV 90.1 06/25/2021   PLT 186 06/25/2021    Recent Labs  Lab 06/27/21 0446  NA 138  K 4.0  CL 101  CO2 27  BUN 28*  CREATININE 1.97*  CALCIUM 9.8  GLUCOSE 136*   Lab Results  Component Value Date   CHOL 133 04/24/2021   HDL 41 04/24/2021   LDLCALC 73 04/24/2021   TRIG 101 04/24/2021   BNP (last 3  results) No results for input(s): BNP in the last 8760 hours.  ProBNP (last 3 results) No results for input(s): PROBNP in the last 8760 hours.   Diagnostic Studies/Procedures   MR CARDIAC MORPHOLOGY W WO CONTRAST  Result Date: 06/26/2021 CLINICAL DATA:  Cardiomyopathy of uncertain etiology EXAM: CARDIAC MRI TECHNIQUE: The patient was scanned on a 1.5 Tesla GE magnet. A dedicated cardiac coil was used. Functional imaging was done using Fiesta sequences. 2,3, and 4 chamber views were  done to assess for RWMA's. Modified Simpson's rule using a short axis stack was used to calculate an ejection fraction on a dedicated work Conservation officer, nature. The patient received 8.5 cc of Gadavist. After 10 minutes inversion recovery sequences were used to assess for infiltration and scar tissue. CONTRAST:  Gadavist 8.5 cc FINDINGS: Limited images of the lung fields showed no gross abnormalities. Normal left ventricular size and wall thickness. Moderate diffuse hypokinesis, EF 36%. Normal right ventricular size with EF 32%. Severe left and right atrial enlargement. Trileaflet aortic valve, no significant regurgitation or stenosis noted. At least mild tricuspid regurgitation. Moderate mitral regurgitation, regurgitant fraction 21%. Delayed enhancement imaging: Mid-wall late gadolinium enhancement (LGE) in the basal to mid septal wall, including the anteroseptal and inferoseptal RV insertion sites. <50% wall thickness subendocardial LGE in the basal to mid anterior wall. MEASUREMENTS: MEASUREMENTS LVEDV 187 mL LVSV 67 mL LVEF 36% RVEDV 147 mL RVSV 47 mL RVEF 32% Aortic forward volume 53 mL T1 1106, ECV 40% septum T2 51 in septum IMPRESSION: 1.  Normal LV size with diffuse hypokinesis, EF 36%. 2.  Normal RV size with moderately decreased RV function, EF 32%. 3.  Moderate mitral regurgitation, regurgitant fraction 21%. 4. Myocardial LGE in septum is in a noncoronary pattern, ?myocarditis. T1 indices and ECV elevated in the septum as well though T2 not significantly elevated. Myocardial LGE in the anterior wall is subendocardial, may be due to prior MI. Gola Bribiesca Electronically Signed   By: Loralie Champagne M.D.   On: 06/26/2021 14:50    Discharge Medications   Allergies as of 06/27/2021   No Known Allergies      Medication List     STOP taking these medications    furosemide 80 MG tablet Commonly known as: LASIX   metolazone 5 MG tablet Commonly known as: ZAROXOLYN   nebivolol 10 MG  tablet Commonly known as: BYSTOLIC   potassium chloride SA 20 MEQ tablet Commonly known as: Klor-Con M20       TAKE these medications    albuterol 108 (90 Base) MCG/ACT inhaler Commonly known as: VENTOLIN HFA Inhale 1-2 puffs into the lungs every 6 (six) hours as needed for wheezing or shortness of breath.   albuterol (2.5 MG/3ML) 0.083% nebulizer solution Commonly known as: PROVENTIL Take 3 mLs (2.5 mg total) by nebulization every 6 (six) hours as needed for wheezing or shortness of breath.   allopurinol 300 MG tablet Commonly known as: ZYLOPRIM Take 300 mg by mouth daily.   amiodarone 200 MG tablet Commonly known as: PACERONE Take 1 tablet (200 mg total) by mouth 2 (two) times daily for 10 days, THEN 1 tablet (200 mg total) daily. Start taking on: July 08, 2021   blood glucose meter kit and supplies Kit Dispense based on patient and insurance preference. Use up to four times daily as directed. (FOR ICD-9 250.00, 250.01).   digoxin 0.125 MG tablet Commonly known as: LANOXIN Take 1 tablet (0.125 mg total) by mouth daily. Start taking  on: June 28, 2021   Eliquis 5 MG Tabs tablet Generic drug: apixaban TAKE 1 TABLET BY MOUTH TWICE A DAY What changed: how much to take   ezetimibe 10 MG tablet Commonly known as: ZETIA Take 1 tablet (10 mg total) by mouth daily.   Farxiga 10 MG Tabs tablet Generic drug: dapagliflozin propanediol Take 1 tablet (10 mg total) by mouth daily. Start taking on: June 28, 2021   ferrous sulfate 325 (65 FE) MG EC tablet Take 325 mg by mouth daily with breakfast.   fexofenadine 180 MG tablet Commonly known as: ALLEGRA Take 180 mg by mouth daily as needed for allergies.   fluticasone 50 MCG/ACT nasal spray Commonly known as: FLONASE Place 2 sprays into the nose daily as needed for allergies or rhinitis.   gabapentin 600 MG tablet Commonly known as: NEURONTIN Take 600 mg by mouth daily.   glucose blood test strip 1 each by  Other route as needed for other. Use as instructed   hydrALAZINE 25 MG tablet Commonly known as: APRESOLINE Take 1 tablet (25 mg total) by mouth 3 (three) times daily.   Insulin Glargine 300 UNIT/ML Sopn Inject 40 Units into the skin 2 (two) times daily before a meal.   isosorbide mononitrate 30 MG 24 hr tablet Commonly known as: IMDUR Take 1 tablet (30 mg total) by mouth daily. What changed:  medication strength how much to take   LORazepam 1 MG tablet Commonly known as: ATIVAN Take 1 mg by mouth 2 (two) times daily.   losartan 25 MG tablet Commonly known as: COZAAR Take 1/2 tablet (12.5 mg total) by mouth daily. Start taking on: June 28, 2021   multivitamin with minerals tablet Take 1 tablet by mouth daily.   nitroGLYCERIN 0.4 MG SL tablet Commonly known as: NITROSTAT DISSOLVE 1 TABLET UNDER THE TONGUE EVERY 5 MINUTES AS NEEDED FOR CHEST PAIN. What changed:  how much to take how to take this when to take this reasons to take this additional instructions   pantoprazole 40 MG tablet Commonly known as: PROTONIX TAKE 1 TABLET BY MOUTH EVERY DAY   rosuvastatin 40 MG tablet Commonly known as: CRESTOR Take 1 tablet (40 mg total) by mouth daily.   spironolactone 25 MG tablet Commonly known as: ALDACTONE Take 1 tablet (25 mg total) by mouth daily.   torsemide 20 MG tablet Commonly known as: DEMADEX Take 2 tablets (40 mg total) by mouth daily. Start taking on: June 28, 2021   triamcinolone ointment 0.5 % Commonly known as: KENALOG Apply to legs 2 times daily for 2 weeks What changed:  how much to take how to take this when to take this reasons to take this additional instructions        Disposition   The patient will be discharged in stable condition to home.   Follow-up Information     [facility] Follow up.   Specialty: Cardiology Why: 2/17 at 1:30 PM at the [facility] at [facility], [place] [address] information: [address] [id number] [place] [facility] Follow up.   Why: Home Health RN and Physical Therapy-agency will call to arrange appts Contact information: [phone]                  Duration of Discharge Encounter: Greater than 35 minutes   Signed,  Brittainy Simmons PA-C  06/27/2021, 2:39 PM   Patient seen with PA, agree with the above note.  Creatinine mildly higher with co-ox 58%.  Will not make additional changes and will continue torsemide 40 daily.  Can go home today with close followup in CHF clinic.   Loralie Champagne 06/27/2021

## 2021-06-27 NOTE — Plan of Care (Signed)

## 2021-06-27 NOTE — TOC Benefit Eligibility Note (Signed)
Patient Advocate Encounter  Received notification that the request for prior authorization for isosorbide-hydralazine (Bidil) 20-37.5 mg has been denied due to has not taken Isorsorbide and hydralyzine at the same time before.         Lyndel Safe, [place] Patient Advocate Specialist [place] Patient Advocate Team Direct Number: [phone]  Fax: [phone]

## 2021-06-27 NOTE — TOC Benefit Eligibility Note (Signed)
Patient Teacher, English as a foreign language completed.    The patient is currently admitted and upon discharge could be taking isosorbid-hydralazine (Bidil) 20-37.5 mg.  Prior Authorization Required  The patient is insured through [place], [place] Patient Advocate Specialist [place] Patient Advocate Team Direct Number: [phone]  Fax: [phone]

## 2021-06-27 NOTE — TOC CM/SW Note (Addendum)
[facility] rep, Corene Cornea spoke to pt/husband and they are requesting a start of care for 06/30/2021. Jonnie Finner RN3 CCM, Heart Failure TOC CM [phone]   HF TOC CM notified [facility] of scheduled dc home today with HH. [place], Heart Failure TOC CM [phone]

## 2021-06-30 ENCOUNTER — Other Ambulatory Visit: Payer: Self-pay | Admitting: Cardiology

## 2021-07-04 ENCOUNTER — Ambulatory Visit (HOSPITAL_COMMUNITY)
Admission: RE | Admit: 2021-07-04 | Discharge: 2021-07-04 | Disposition: A | Discharge status: Home / Self Care | Payer: Medicare Other | Source: Ambulatory Visit | Attending: Family Medicine | Admitting: Family Medicine

## 2021-07-04 ENCOUNTER — Encounter (HOSPITAL_COMMUNITY): Payer: Self-pay

## 2021-07-04 ENCOUNTER — Other Ambulatory Visit: Payer: Self-pay

## 2021-07-04 ENCOUNTER — Telehealth (HOSPITAL_COMMUNITY): Payer: Self-pay

## 2021-07-04 VITALS — BP 131/72 | HR 87 | Wt 193.4 lb

## 2021-07-04 DIAGNOSIS — Z87891 Personal history of nicotine dependence: Secondary | ICD-10-CM | POA: Diagnosis not present

## 2021-07-04 DIAGNOSIS — I255 Ischemic cardiomyopathy: Secondary | ICD-10-CM | POA: Diagnosis not present

## 2021-07-04 DIAGNOSIS — Z853 Personal history of malignant neoplasm of breast: Secondary | ICD-10-CM | POA: Insufficient documentation

## 2021-07-04 DIAGNOSIS — I5022 Chronic systolic (congestive) heart failure: Secondary | ICD-10-CM | POA: Insufficient documentation

## 2021-07-04 DIAGNOSIS — I340 Nonrheumatic mitral (valve) insufficiency: Secondary | ICD-10-CM | POA: Diagnosis not present

## 2021-07-04 DIAGNOSIS — Z955 Presence of coronary angioplasty implant and graft: Secondary | ICD-10-CM | POA: Insufficient documentation

## 2021-07-04 DIAGNOSIS — Z7984 Long term (current) use of oral hypoglycemic drugs: Secondary | ICD-10-CM | POA: Diagnosis not present

## 2021-07-04 DIAGNOSIS — Z9861 Coronary angioplasty status: Secondary | ICD-10-CM

## 2021-07-04 DIAGNOSIS — Z794 Long term (current) use of insulin: Secondary | ICD-10-CM | POA: Insufficient documentation

## 2021-07-04 DIAGNOSIS — I4821 Permanent atrial fibrillation: Secondary | ICD-10-CM | POA: Insufficient documentation

## 2021-07-04 DIAGNOSIS — Z96641 Presence of right artificial hip joint: Secondary | ICD-10-CM | POA: Diagnosis not present

## 2021-07-04 DIAGNOSIS — I272 Pulmonary hypertension, unspecified: Secondary | ICD-10-CM

## 2021-07-04 DIAGNOSIS — I2721 Secondary pulmonary arterial hypertension: Secondary | ICD-10-CM | POA: Insufficient documentation

## 2021-07-04 DIAGNOSIS — I130 Hypertensive heart and chronic kidney disease with heart failure and stage 1 through stage 4 chronic kidney disease, or unspecified chronic kidney disease: Secondary | ICD-10-CM | POA: Diagnosis not present

## 2021-07-04 DIAGNOSIS — N183 Chronic kidney disease, stage 3 unspecified: Secondary | ICD-10-CM | POA: Insufficient documentation

## 2021-07-04 DIAGNOSIS — Z79899 Other long term (current) drug therapy: Secondary | ICD-10-CM | POA: Insufficient documentation

## 2021-07-04 DIAGNOSIS — I493 Ventricular premature depolarization: Secondary | ICD-10-CM | POA: Insufficient documentation

## 2021-07-04 DIAGNOSIS — E1122 Type 2 diabetes mellitus with diabetic chronic kidney disease: Secondary | ICD-10-CM | POA: Insufficient documentation

## 2021-07-04 DIAGNOSIS — I5032 Chronic diastolic (congestive) heart failure: Secondary | ICD-10-CM | POA: Diagnosis not present

## 2021-07-04 DIAGNOSIS — I2510 Atherosclerotic heart disease of native coronary artery without angina pectoris: Secondary | ICD-10-CM | POA: Diagnosis not present

## 2021-07-04 DIAGNOSIS — E785 Hyperlipidemia, unspecified: Secondary | ICD-10-CM | POA: Insufficient documentation

## 2021-07-04 DIAGNOSIS — N1831 Chronic kidney disease, stage 3a: Secondary | ICD-10-CM

## 2021-07-04 DIAGNOSIS — Z7901 Long term (current) use of anticoagulants: Secondary | ICD-10-CM | POA: Insufficient documentation

## 2021-07-04 LAB — DIGOXIN LEVEL: Digoxin Level: 1.4 ng/mL (ref 0.8–2.0)

## 2021-07-04 LAB — CBC
HCT: 35.6 % — ABNORMAL LOW (ref 36.0–46.0)
Hemoglobin: 11.1 g/dL — ABNORMAL LOW (ref 12.0–15.0)
MCH: 29.4 pg (ref 26.0–34.0)
MCHC: 31.2 g/dL (ref 30.0–36.0)
MCV: 94.2 fL (ref 80.0–100.0)
Platelets: 224 10*3/uL (ref 150–400)
RBC: 3.78 MIL/uL — ABNORMAL LOW (ref 3.87–5.11)
RDW: 16.8 % — ABNORMAL HIGH (ref 11.5–15.5)
WBC: 7.9 10*3/uL (ref 4.0–10.5)
nRBC: 0 % (ref 0.0–0.2)

## 2021-07-04 LAB — BASIC METABOLIC PANEL
Anion gap: 11 (ref 5–15)
BUN: 21 mg/dL (ref 8–23)
CO2: 30 mmol/L (ref 22–32)
Calcium: 10.5 mg/dL — ABNORMAL HIGH (ref 8.9–10.3)
Chloride: 101 mmol/L (ref 98–111)
Creatinine, Ser: 1.58 mg/dL — ABNORMAL HIGH (ref 0.44–1.00)
GFR, Estimated: 35 mL/min — ABNORMAL LOW (ref >60)
Glucose, Bld: 103 mg/dL — ABNORMAL HIGH (ref 70–99)
Potassium: 4.4 mmol/L (ref 3.5–5.1)
Sodium: 142 mmol/L (ref 135–145)

## 2021-07-04 LAB — BRAIN NATRIURETIC PEPTIDE: B Natriuretic Peptide: 117.6 pg/mL — ABNORMAL HIGH (ref 0.0–100.0)

## 2021-07-04 MED ORDER — ISOSORBIDE MONONITRATE ER 60 MG PO TB24: 60.0000 mg | ORAL_TABLET | Freq: Every day | ORAL | 6 refills | Status: DC

## 2021-07-04 MED ORDER — DIGOXIN 62.5 MCG PO TABS: 0.0625 ug | ORAL_TABLET | Freq: Every day | ORAL | 6 refills | Status: DC

## 2021-07-04 NOTE — Progress Notes (Signed)
ReDS Vest / Clip - 07/04/21 1340       ReDS Vest / Clip   Station Marker B    Ruler Value 33    ReDS Value Range Low volume    ReDS Actual Value 27    Anatomical Comments sitting

## 2021-07-04 NOTE — Progress Notes (Signed)
ADVANCED HF CLINIC CONSULT NOTE   Primary Care: Lin Landsman, MD Primary Cardiologist: Dr. Ellyn Hack HF Cardiologist: Dr. Aundra Dubin  HPI: Stephanie Franco is a 71 y.o. with history of permanent A fib for 20 years, DMII, HTN, Hyperlipidemia, left breast cancer (has mastectomy 20 years ago), right hip replacement, arthritis, and CAD prior PCI OM in 2992 and systolic heart failure.    Had sleep study 05/2021 - negative for sleep apnea.   She was admitted 06/20/21 with chest pain, but also noted to have orthopnea and worsening dyspnea. HS Trop 71>58>51. Echo showed EF 25-30%, moderate RV dysfunction, moderate biatrial enlargement, at least moderate posteriorly-directed MR. LHC/RHC showed stable CAD with elevated R>L heart filling pressures and low cardiac index 1.89.  AHF team consulted. She was started on milrinone, PICC placed. Started on IV lasix and added amiodarone given frequent PVCs. Underwent further w/u of PH and HF. V/Q scan was not suggestive of chronic PE.  Serologic workup negative (ANA, anti-centromere ab, anti-scl70, RF). TEE was done to better assess severity of her MR. This showed restriction of the posterior mitral leaflet and moderate eccentric mitral regurgitation (posteriorly-directed). Normal LV size w/ global HK EF 35-40%, RV size normal w/ moderate systolic dysfunction, severe LAE. Suspect functional MR, possibly atrial functional MR. 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  LVEF on MRI 36%. MR moderate. She was weaned off of milrinone w/ stable co-ox and transition to PO diuretics, torsemide 40 mg daily. GDMT added. SCr stabilized ~1.9. She was discharged home, weight 188 lbs.  Today she returns for HF follow up with her husband. Overall feeling fine since discharge but started to have some SOB yesterday, also with atypical CP and took 3 SL nitro tablets. She is unsure if symptoms are related to  anxiety, but took prn lorazepam, but did not help. Also feeling palpitations since last night. + PND. Remains fatigued. Denies dizziness, edema, or abnormal bleeding. Appetite fair. No fever or chills. Weight at home 188 pounds. Taking all medications.   ECG (personally reviewed): atrial fibrillation 68 bpm  Labs (2/23): K 4.0, creatinine 1.97  ReDs: 27%   Cardiac Testing - VQ scan (2/23): no evidence for chronic PE  - R/LHC (2/23): RA 21, PA 80/30, CO 3.6 , CI 1.9. Prox RCA to Mid RCA lesion is 60% stenosed.  Dist Cx lesion is 90% stenosed. Previously placed Mid LAD stent (unknown type) is  widely patent.  - cMRI (2/23): findings suggest mixed ischemic/nonischemic cardiomyopathy (?myocarditis with prior coronary disease).  LVEF on MRI 36%. MR moderate.   - Echo (2/23): EF 25% RV moderately reduced, LA severely dilated, RA moderately dilated, MV restricted posterior leaflet . Moderate MR.   - Echo (2020): EF 50-55%  LA and RA moderately dilated.  - Echo (2019): EF 40-45% RV moderately dilated.   Review of Systems: [y] = yes, [ ]  = no   General: Weight gain [ ] ; Weight loss [ ] ; Anorexia [ ] ; Fatigue Blue.Reese ]; Fever [ ] ; Chills [ ] ; Weakness [ ]   Cardiac: Chest pain/pressure [ ] ; Resting SOB [ ] ; Exertional SOB [ y]; Orthopnea [ ] ; Pedal Edema [ ] ; Palpitations Blue.Reese ]; Syncope [ ] ; Presyncope [ ] ; Paroxysmal nocturnal dyspnea[y ]  Pulmonary: Cough [ ] ; Wheezing[ ] ; Hemoptysis[ ] ; Sputum [ ] ; Snoring [ ]   GI: Vomiting[ ] ; Dysphagia[ ] ; Melena[ ] ; Hematochezia [ ] ; Heartburn[ ] ; Abdominal pain [ ] ; Constipation [ ] ; Diarrhea [ ] ;  BRBPR [ ]   GU: Hematuria[ ] ; Dysuria [ ] ; Nocturia[ ]   Vascular: Pain in legs with walking [ ] ; Pain in feet with lying flat [ ] ; Non-healing sores [ ] ; Stroke [ ] ; TIA [ ] ; Slurred speech [ ] ;  Neuro: Headaches[ ] ; Vertigo[ ] ; Seizures[ ] ; Paresthesias[ ] ;Blurred vision [ ] ; Diplopia [ ] ; Vision changes [ ]   Ortho/Skin: Arthritis [ ] ; Joint pain [ ] ; Muscle pain [ ] ;  Joint swelling [ ] ; Back Pain [ ] ; Rash [ ]   Psych: Depression[ ] ; Anxiety[ ]   Heme: Bleeding problems [ ] ; Clotting disorders [ ] ; Anemia [ ]   Endocrine: Diabetes Blue.Reese ]; Thyroid dysfunction[ ]    Past Medical History:  Diagnosis Date   Aortic atherosclerosis (HCC)    Arthritis    Asthma    Atrial fibrillation, permanent ([place])    Rate control with Bystolic. CHA2DS2Vasc = 6 (HTN, DM, CHF, Age 9, Female) -> on Pradaxa   Breast cancer ([place]) 2008   S/P mastectomy   CAD S/P percutaneous coronary angioplasty 2006;    PCI of circumflex with Taxus DES;; relook-cath Feb 2013: 50-60% short lesion in RCA, 40% ISR Circumflex stent.   CKD (chronic kidney disease), stage III (HCC)    Complication of anesthesia    prolonged sedation after colonoscopy in [place]   Diabetes mellitus, uncontrolled 07/14/2011   Dilated cardiomyopathy (HCC)    Mostly Resolved (EF up from ~25% to ~45% by Echo)   DOE (dyspnea on exertion)    Edema of both legs    Usually mild, chronic. Controlled with when necessary furosemide and diet   Fibroid, uterine 07/13/11   "have that now"   GERD (gastroesophageal reflux disease)    Gout    Hyperlipidemia    Hypertension    Hypokalemia    Morbid obesity ([place])    BMI 41   OSA on CPAP    On CPAP   Pulmonary hypertension, unspecified (HCC)    PAP ~90 mmHg on Echo 12/2017 -- has OSA on CPAP & obesity - but Overall Improved Sx of dyspnea / edema   Systolic murmur    Current Outpatient Medications  Medication Sig Dispense Refill   albuterol (PROVENTIL HFA;VENTOLIN HFA) 108 (90 Base) MCG/ACT inhaler Inhale 1-2 puffs into the lungs every 6 (six) hours as needed for wheezing or shortness of breath. 1 Inhaler 0   albuterol (PROVENTIL) (2.5 MG/3ML) 0.083% nebulizer solution Take 3 mLs (2.5 mg total) by nebulization every 6 (six) hours as needed for wheezing or shortness of breath. 75 mL 12   allopurinol (ZYLOPRIM) 300 MG tablet Take 300 mg by mouth daily.     amiodarone (PACERONE) 200  MG tablet Take 1 tablet (200 mg total) by mouth 2 (two) times daily for 10 days, THEN 1 tablet (200 mg total) daily. 45 tablet 3   blood glucose meter kit and supplies KIT Dispense based on patient and insurance preference. Use up to four times daily as directed. (FOR ICD-9 250.00, 250.01). 1 each 0   dapagliflozin propanediol (FARXIGA) 10 MG TABS tablet Take 1 tablet (10 mg total) by mouth daily. 30 tablet 5   digoxin (LANOXIN) 0.125 MG tablet Take 1 tablet (0.125 mg total) by mouth daily. 30 tablet 5   ELIQUIS 5 MG TABS tablet TAKE 1 TABLET BY MOUTH TWICE A DAY 180 tablet 2   ezetimibe (ZETIA) 10 MG tablet Take 1 tablet (10 mg total) by mouth daily. 30 tablet 5   ferrous sulfate 325 (65  FE) MG EC tablet Take 325 mg by mouth daily with breakfast.     fexofenadine (ALLEGRA) 180 MG tablet Take 180 mg by mouth daily as needed for allergies.   3   fluticasone (FLONASE) 50 MCG/ACT nasal spray Place 2 sprays into the nose daily as needed for allergies or rhinitis.      gabapentin (NEURONTIN) 600 MG tablet Take 600 mg by mouth daily.     glucose blood test strip 1 each by Other route as needed for other. Use as instructed     hydrALAZINE (APRESOLINE) 25 MG tablet Take 1 tablet (25 mg total) by mouth 3 (three) times daily. 90 tablet 6   Insulin Glargine 300 UNIT/ML SOPN Inject 40 Units into the skin 2 (two) times daily before a meal.      isosorbide mononitrate (IMDUR) 30 MG 24 hr tablet Take 1 tablet (30 mg total) by mouth daily. 30 tablet 6   LORazepam (ATIVAN) 1 MG tablet Take 1 mg by mouth 2 (two) times daily.     losartan (COZAAR) 25 MG tablet Take 1/2 tablet (12.5 mg total) by mouth daily. 30 tablet 5   Multiple Vitamins-Minerals (MULTIVITAMIN WITH MINERALS) tablet Take 1 tablet by mouth daily.     nitroGLYCERIN (NITROSTAT) 0.4 MG SL tablet DISSOLVE 1 TABLET UNDER THE TONGUE EVERY 5 MINUTES AS NEEDED FOR CHEST PAIN. 25 tablet 0   pantoprazole (PROTONIX) 40 MG tablet TAKE 1 TABLET BY MOUTH EVERY DAY  90 tablet 3   rosuvastatin (CRESTOR) 40 MG tablet Take 1 tablet (40 mg total) by mouth daily. 90 tablet 3   spironolactone (ALDACTONE) 25 MG tablet Take 1 tablet (25 mg total) by mouth daily. 30 tablet 5   torsemide (DEMADEX) 20 MG tablet Take 2 tablets (40 mg total) by mouth daily. 60 tablet 5   triamcinolone ointment (KENALOG) 0.5 % Apply to legs 2 times daily for 2 weeks 30 g 0   No current facility-administered medications for this encounter.   No Known Allergies  Social History   Socioeconomic History   Marital status: Married    Spouse name: Not on file   Number of children: 4   Years of education: Not on file   Highest education level: Not on file  Occupational History   Not on file  Tobacco Use   Smoking status: Former    Packs/day: 0.50    Years: 6.00    Pack years: 3.00    Types: Cigarettes    Quit date: 05/18/1992    Years since quitting: 29.1   Smokeless tobacco: Never  Vaping Use   Vaping Use: Never used  Substance and Sexual Activity   Alcohol use: No    Comment: 07/13/11 "have drank occasionally; not now"   Drug use: No   Sexual activity: Not Currently    Birth control/protection: Surgical  Other Topics Concern   Not on file  Social History Narrative   She is a married mother of 39, grandmother 2. Usually accompanied by her husband. She does not work. She had been working on her exercise, but is no longer as active. Does not drink and does not smoke   Social Determinants of Radio broadcast assistant Strain: Not on file  Food Insecurity: Not on file  Transportation Needs: Not on file  Physical Activity: Not on file  Stress: Not on file  Social Connections: Not on file  Intimate Partner Violence: Not on file   Family History  Problem Relation Age  of Onset   Coronary artery disease Mother    Hypertension Mother    Heart disease Father    Atrial fibrillation Son    Lung cancer Sister    Heart disease Brother    Heart disease Brother    Healthy Son     Thyroid disease Daughter    Healthy Daughter    Healthy Daughter    FH: her son died Jul 20, 2017 massive heart attack   BP 131/72    Pulse 87    Wt 87.7 kg (193 lb 6.4 oz)    SpO2 96%    BMI 34.26 kg/m   Wt Readings from Last 3 Encounters:  07/04/21 87.7 kg (193 lb 6.4 oz)  06/27/21 85.8 kg (189 lb 3.2 oz)  05/28/21 88.5 kg (195 lb)   PHYSICAL EXAM: General:  NAD. No resp difficulty, arrived in [facility] HEENT: Normal Neck: Supple. JVP 7-8. Carotids 2+ bilat; no bruits. No lymphadenopathy or thryomegaly appreciated. Cor: PMI nondisplaced. Irregular rate & rhythm. No rubs, gallops or murmurs. Lungs: Clear Abdomen: Obese, nontender, nondistended. No hepatosplenomegaly. No bruits or masses. Good bowel sounds. Extremities: No cyanosis, clubbing, rash, edema Neuro: Alert & oriented x 3, cranial nerves grossly intact. Moves all 4 extremities w/o difficulty. Affect pleasant.  ASSESSMENT & PLAN: 1. Chronic systolic CHF: ? Ischemic cardiomyopathy. Echo 1/23 with EF 25-30%, moderate RV dysfunction, moderate biatrial enlargement, at least moderate posteriorly-directed MR (may be worse).  Most recent prior echo showed EF 50-55% in 9/20, EF was 20-25% on 6/16 echo.  It is not clear why EF has dropped since 9/20 (cath does not explain).  Atrial fibrillation has been chronic x years. Frequent PVCs are noted.  RHC showed elevated R>L heart filling pressures with pulmonary arterial hypertension and low CI 1.89. Cardiac MRI w/ myocardial LGE in septum is in a noncoronary pattern, ? myocarditis, anterior wall LGE is subendocardial and may be due to prior MI => ?mixed ischemic/nonischemic cardiomyopathy. NYHA III-IIIb, appears mildly volume overloaded on exam, weight up 4 lbs. However ReDs 27%. - Continue torsemide 40 mg daily. Can take extra 20 mg daily x 2 days to see if this helps with dyspnea. - Continue digoxin 0.125 mg daily. Check level today. - hold Toprol XL for now with fatigue.  - Continue spironolactone  25 mg daily. BMET/BNP today. - Continue Farxiga 10 mg daily.  - Continue losartan 12.5 daily, no increase or switch to ARNI yet with rise in creatinine.   - Continue hydralazine 25 mg tid.  - Increase Imdur to 60 mg daily. 2. Atrial fibrillation: This appears permanent, x years.   - Continue Eliquis 5 mg bid. No bleeding issues. CBC today. 3. PVCs: Frequent during recent admission. ? Contributing to cardiomyopathy. None on ECG today.  - Continue amiodarone taper, decrease to 200 mg daily on 07/08/21. 4. Mitral regurgitation: TEE 2/8 showed restriction of the posterior mitral leaflet and moderate eccentric mitral regurgitation (posteriorly-directed). Normal LV size w/ global HK EF 35-40%, RV size normal w/ moderate systolic dysfunction, severe LAE. Suspect functional MR, possibly atrial functional MR.  Cardiac MRI with moderate MR.  - Will need monitoring, if progression to severe may need MitraClip.  5. CKD stage 3: BMET today. 6. Pulmonary hypertension: PCWP not obtained, so with using LVEDP, PVR is 11 WU suggesting severe pulmonary arterial hypertension.  Etiology unclear.  Recent sleep study negative.  V/Q scan not suggestive of chronic PE.  Serologic workup negative (ANA, anti-centromere ab, anti-scl70, RF) - Consider repeating RHC  down the road after diuresis and with plan to get a true PCWP.  7. CAD: Cath this admission (2/23) with prox RCA to Mid RCA lesion 60% stenosis, distal LCx lesion 90% stenosis (similar to the past), previously placed mid LAD stent (unknown type) is widely patent.  Medical management. Not clear that CAD can explain her cardiomyopathy. With recent episode of atypical chest pain requiring nitro use, increase long acting nitrate as above. ECG today with no acute ST-T changes. - Continue statin.  - No ASA given Eliquis use.    Follow up with PharmD in 3 weeks (increase losartan or BiDil), APP in 6 weeks and Dr. Aundra Dubin in 12 weeks + echo.  Allena Katz,  FNP-BC 07/04/21

## 2021-07-04 NOTE — Telephone Encounter (Addendum)
Pt aware, agreeable, and verbalized understanding  Lab appointment scheduled and script sent to pharmacy  ----- Message from Rafael Bihari, [place] sent at 07/04/2021  4:11 PM EST ----- Dig level too high.   Hold dig x 3 days then resume at 0.0625 daily. Repeat dig level in 1 week.

## 2021-07-04 NOTE — Patient Instructions (Addendum)
INCREASE Imdur to 60 mg, one tab daily INCREASE Torsemide to 60 mg for 2 days, then resume normal dose of 40 mg daily there after  Labs today We will only contact you if something comes back abnormal or we need to make some changes. Otherwise no news is good news!  Your physician recommends that you schedule a follow-up appointment in: 3 weeks with the HF pharmacy team, in 6 weeks  in the Advanced Practitioners (PA/NP) Clinic, and in 12 weeks with Dr Domingo Dimes  Your physician has requested that you have an echocardiogram. Echocardiography is a painless test that uses sound waves to create images of your heart. It provides your doctor with information about the size and shape of your heart and how well your hearts chambers and valves are working. This procedure takes approximately one hour. There are no restrictions for this procedure.  Do the following things EVERYDAY: Weigh yourself in the morning before breakfast. Write it down and keep it in a log. Take your medicines as prescribed Eat low salt foods--Limit salt (sodium) to 2000 mg per day.  Stay as active as you can everyday Limit all fluids for the day to less than 2 liters  At the [facility], you and your health needs are our priority. As part of our continuing mission to provide you with exceptional heart care, we have created designated Provider Care Teams. These Care Teams include your primary Cardiologist (physician) and Advanced Practice Providers (APPs- Physician Assistants and Nurse Practitioners) who all work together to provide you with the care you need, when you need it.   You may see any of the following providers on your designated Care Team at your next follow up: Dr Glori Bickers Dr Haynes Kerns, NP Lyda Jester, [facility] [place], [place] Audry Riles, PharmD   Please be sure to bring in all your medications bottles to every appointment.   If you have any  questions or concerns before your next appointment please send [place] a message through Alpha or call our office at [phone].    TO LEAVE A MESSAGE FOR THE NURSE SELECT OPTION 2, PLEASE LEAVE A MESSAGE INCLUDING: YOUR NAME DATE OF BIRTH CALL BACK NUMBER REASON FOR CALL**this is important as we prioritize the call backs  YOU WILL RECEIVE A CALL BACK THE SAME DAY AS LONG AS YOU CALL BEFORE 4:00 PM

## 2021-07-07 ENCOUNTER — Other Ambulatory Visit (HOSPITAL_COMMUNITY): Payer: Self-pay

## 2021-07-07 MED ORDER — DIGOXIN 125 MCG PO TABS: 0.0625 mg | ORAL_TABLET | Freq: Every day | ORAL | 3 refills | Status: DC

## 2021-07-08 LAB — ECHO TEE
MV M vel: 4.93 m/s
MV Peak grad: 97.2 mmHg
Radius: 0.67 cm

## 2021-07-10 ENCOUNTER — Ambulatory Visit (HOSPITAL_COMMUNITY)
Admission: RE | Admit: 2021-07-10 | Discharge: 2021-07-10 | Disposition: A | Discharge status: Home / Self Care | Payer: Medicare Other | Source: Ambulatory Visit | Attending: Internal Medicine | Admitting: Internal Medicine

## 2021-07-10 ENCOUNTER — Other Ambulatory Visit: Payer: Self-pay

## 2021-07-10 DIAGNOSIS — I5022 Chronic systolic (congestive) heart failure: Secondary | ICD-10-CM

## 2021-07-10 LAB — DIGOXIN LEVEL: Digoxin Level: 0.5 ng/mL — ABNORMAL LOW (ref 0.8–2.0)

## 2021-07-15 ENCOUNTER — Other Ambulatory Visit (HOSPITAL_COMMUNITY): Payer: Self-pay

## 2021-07-15 DIAGNOSIS — M5136 Other intervertebral disc degeneration, lumbar region: Secondary | ICD-10-CM | POA: Insufficient documentation

## 2021-07-17 ENCOUNTER — Other Ambulatory Visit: Payer: Self-pay | Admitting: Cardiology

## 2021-07-18 ENCOUNTER — Other Ambulatory Visit (HOSPITAL_COMMUNITY): Payer: Self-pay

## 2021-07-25 ENCOUNTER — Other Ambulatory Visit: Payer: Self-pay | Admitting: Cardiology

## 2021-07-27 ENCOUNTER — Telehealth: Payer: Self-pay | Admitting: Internal Medicine

## 2021-07-27 NOTE — Telephone Encounter (Signed)
Home health nurse called stating she is having slightly more SOB but overall okay. I suggested take 1 extra dose of torsemide today and then assess how she does. If that does not improve, she should come to ED.  ?

## 2021-07-27 NOTE — Progress Notes (Incomplete)
***In Progress***    Advanced Heart Failure Clinic Note  Primary Care: Lin Landsman, MD Primary Cardiologist: Dr. Ellyn Hack HF Cardiologist: Dr. Aundra Dubin  HPI:  Stephanie Franco is a 71 y.o. with history of permanent A fib for 20 years, T2DM, HTN, Hyperlipidemia, left breast cancer (has mastectomy 20 years ago), right hip replacement, arthritis, and CAD with prior PCI to OM in 7353 and systolic heart failure.    She had sleep study 05/2021 that was negative for sleep apnea.    She was admitted 06/20/2021 with chest pain, but also noted to have orthopnea and worsening dyspnea. HS Trop 71>58>51. Echo showed EF 25-30%, moderate RV dysfunction, moderate biatrial enlargement, at least moderate posteriorly-directed MR. LHC/RHC showed stable CAD with elevated R>L heart filling pressures and low cardiac index 1.89.  AHF team consulted. She was started on milrinone, PICC placed. Started on IV lasix and added amiodarone given frequent PVCs. Underwent further w/u of PH and HF. V/Q scan was not suggestive of chronic PE.  Serologic workup negative (ANA, anti-centromere ab, anti-scl70, RF). TEE was done to better assess severity of her MR. This showed restriction of the posterior mitral leaflet and moderate eccentric mitral regurgitation (posteriorly-directed). Normal LV size w/ global HK EF 35-40%, RV size normal w/ moderate systolic dysfunction, severe LAE. Suspected functional MR, possibly atrial functional MR. 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  LVEF on cMRI 36%. MR moderate. She was weaned off of milrinone w/ stable co-ox and transition to PO diuretics, torsemide 40 mg daily. GDMT added. SCr stabilized ~1.9. She was discharged home, weight 188 lbs.   She returned for follow-up with her husband on 07/04/2021. Overall she was feeling fine since discharge but started to have some SOB the day prior, also with atypical CP and took 3 SL  nitro tablets. She questioned if the symptoms were related to anxiety, but took prn lorazepam, which did not help. She was also feeling palpitations since last the night prior. She was positive for PND and remained fatigued, but denied dizziness, edema, or abnormal bleeding. Her appetite was fair and had no fever or chills. She had been weighing at home and was 188 pounds and taking all her medications. REDS in clinic was 27%.   Today she returns to HF clinic for pharmacist medication titration. At last visit with NP  Imdur was increased from 30 to 60 mg daily and patient was instructed to increase torsemide to 60 mg daily x 2 days then return to her usual 40 mg daily to see if this helped with dyspnea as she appeared mildly volume overloaded on exam and weight was up 4lbs, despite normal REDS reading. Labs were also checked and digoxin level was elevated at 1.4. Digoxin was held for 3 days and then decreased from 0.125 mg to 0.0625 mg daily. Digoxin level 1 week later was improved to 0.5.   Overall feeling ***. Dizziness, lightheadedness, fatigue:  Chest pain or palpitations:  How is your breathing?: *** SOB: Able to complete all ADLs. Activity level ***  Weight at home pounds. Takes furosemide/torsemide/bumex *** mg *** daily.  LEE PND/Orthopnea  Appetite *** Low-salt diet:   Physical Exam Cost/affordability of meds   HF Medications: Losartan 12.5 mg daily Spironolactone 25 mg daily Dapagliflozin 10 mg daily Imdur 60 mg daily Hydralazine 25 mg TID Digoxin 0.0625 mg daily  Torsemide 40 mg daily Kcl 20 mEq BID  Has the patient been experiencing any side effects  to the medications prescribed?  {YES NO:22349}  Does the patient have any problems obtaining medications due to transportation or finances?   {YES NO:22349}  Understanding of regimen: {excellent/good/fair/poor:19665} Understanding of indications: {excellent/good/fair/poor:19665} Potential of compliance:  {excellent/good/fair/poor:19665} Patient understands to avoid NSAIDs. Patient understands to avoid decongestants.    Pertinent Lab Values: Las 07/04/2021: Serum creatinine 1.58, BUN 21, Potassium 4.4, Sodium 142, BNP 117.6, Digoxin 1.4  Labs 07/10/2021: Digoxin 0.5  Vital Signs: Weight: *** (last clinic weight: 193.4 lbs)  Blood pressure: *** 131/72 some lower 105/66 Heart rate: *** 87 (60-80s)  Plan Labs BNP to assess volume, bmet for Scr, consider dig level A losartan to 25- bmet 1 week B Hydral to 50 tid, imdur to 90  C resume metoprolol succinate 12.5 mg daily- also has AF  Assessment/Plan: 1. Chronic systolic CHF (EF ***), due to ***. NYHA class *** symptoms. 1. Chronic systolic CHF: ? Ischemic cardiomyopathy. Echo 05/2021 with EF 25-30%, moderate RV dysfunction, moderate biatrial enlargement, at least moderate posteriorly-directed MR (may be worse).  Most recent prior echo showed EF 50-55% in 01/2019, EF was 20-25% on 6/16 echo.  Unclear why EF dropped since 01/2019 (cath does not explain).  Atrial fibrillation has been chronic x years. Frequent PVCs are noted.  RHC showed elevated R>L heart filling pressures with pulmonary arterial hypertension and low CI 1.89. Cardiac MRI w/ myocardial LGE in septum is in a noncoronary pattern, ? myocarditis, anterior wall LGE is subendocardial and may be due to prior MI => ?mixed ischemic/nonischemic cardiomyopathy.  NYHA III-IIIb, appears mildly volume *** - Continue torsemide 40 mg daily. - hold Toprol XL for now with fatigue.  - Continue losartan 12.5 daily - Continue spironolactone 25 mg daily. - Continue Farxiga 10 mg daily.  - Continue hydralazine 25 mg tid.  - Continue Imdur to 60 mg daily. - Continue digoxin 0.0625 mg daily 2. Atrial fibrillation: This appears permanent, x years.   - Continue Eliquis 5 mg bid. No bleeding issues. CBC today. 3. PVCs: Frequent during recent admission. ? Contributing to cardiomyopathy. None on ECG today.   - Continue amiodarone 200 mg daily 4. Mitral regurgitation: TEE 2/8 showed restriction of the posterior mitral leaflet and moderate eccentric mitral regurgitation (posteriorly-directed). Normal LV size w/ global HK EF 35-40%, RV size normal w/ moderate systolic dysfunction, severe LAE. Suspect functional MR, possibly atrial functional MR.  Cardiac MRI with moderate MR.  - Will need monitoring, if progression to severe may need MitraClip.  5. CKD stage 3: losartan as above 6. Pulmonary hypertension: PCWP not obtained, so with using LVEDP, PVR is 11 WU suggesting severe pulmonary arterial hypertension.  Etiology unclear.  Recent sleep study negative.  V/Q scan not suggestive of chronic PE.  Serologic workup negative (ANA, anti-centromere ab, anti-scl70, RF) - Consider repeating RHC down the road after diuresis and with plan to get a true PCWP.  7. CAD: Cath 06/2021 with prox RCA to Mid RCA lesion 60% stenosis, distal LCx lesion 90% stenosis (similar to previous), previously placed mid LAD stent (unknown type) is widely patent.  Medical management. Not clear that CAD can explain her cardiomyopathy. With recent episode of atypical chest pain requiring nitro use, continue *** long acting nitrate as above.  - Continue statin.  - No ASA given Eliquis use.   Follow up APP in 3 weeks and Dr. Aundra Dubin in 9 weeks + ECHO.  Audry Riles, PharmD, BCPS, BCCP, CPP Heart Failure Clinic Pharmacist [phone]

## 2021-07-28 ENCOUNTER — Inpatient Hospital Stay (HOSPITAL_COMMUNITY): Admission: RE | Admit: 2021-07-28 | Payer: Medicare Other | Source: Ambulatory Visit

## 2021-08-05 ENCOUNTER — Telehealth: Payer: Self-pay | Admitting: Physician Assistant

## 2021-08-05 NOTE — Telephone Encounter (Signed)
Paged by answering service that patient is tired and fatigued.  Low appetite and occasional nausea.  Blood pressure 119/58 with heart rate of 55.  Patient denies chest pain, shortness of breath, orthopnea, PND, syncope or lower extremity edema.  Intermittent dizziness.  I have recommended to keep log of blood pressure after taking medication.  Patient wants to hold hydralazine.  I have recommended that she can hold tonight and see response.  Reach out to heart failure clinic tomorrow for further direction. ?

## 2021-08-08 ENCOUNTER — Emergency Department (HOSPITAL_COMMUNITY)
Admission: EM | Admit: 2021-08-08 | Discharge: 2021-08-08 | Disposition: A | Discharge status: Home / Self Care | Payer: Medicare Other | Attending: Emergency Medicine | Admitting: Emergency Medicine

## 2021-08-08 ENCOUNTER — Other Ambulatory Visit: Payer: Self-pay | Admitting: Student

## 2021-08-08 ENCOUNTER — Emergency Department (HOSPITAL_COMMUNITY): Payer: Medicare Other

## 2021-08-08 ENCOUNTER — Other Ambulatory Visit: Payer: Self-pay

## 2021-08-08 ENCOUNTER — Encounter (HOSPITAL_COMMUNITY): Payer: Self-pay

## 2021-08-08 DIAGNOSIS — Z79899 Other long term (current) drug therapy: Secondary | ICD-10-CM

## 2021-08-08 DIAGNOSIS — N189 Chronic kidney disease, unspecified: Secondary | ICD-10-CM | POA: Insufficient documentation

## 2021-08-08 DIAGNOSIS — I2510 Atherosclerotic heart disease of native coronary artery without angina pectoris: Secondary | ICD-10-CM | POA: Diagnosis not present

## 2021-08-08 DIAGNOSIS — I4891 Unspecified atrial fibrillation: Secondary | ICD-10-CM | POA: Diagnosis not present

## 2021-08-08 DIAGNOSIS — R110 Nausea: Secondary | ICD-10-CM

## 2021-08-08 DIAGNOSIS — I509 Heart failure, unspecified: Secondary | ICD-10-CM | POA: Diagnosis not present

## 2021-08-08 DIAGNOSIS — N179 Acute kidney failure, unspecified: Secondary | ICD-10-CM

## 2021-08-08 DIAGNOSIS — R072 Precordial pain: Secondary | ICD-10-CM | POA: Diagnosis present

## 2021-08-08 DIAGNOSIS — E1122 Type 2 diabetes mellitus with diabetic chronic kidney disease: Secondary | ICD-10-CM | POA: Insufficient documentation

## 2021-08-08 DIAGNOSIS — Z794 Long term (current) use of insulin: Secondary | ICD-10-CM | POA: Diagnosis not present

## 2021-08-08 DIAGNOSIS — R5383 Other fatigue: Secondary | ICD-10-CM

## 2021-08-08 DIAGNOSIS — I130 Hypertensive heart and chronic kidney disease with heart failure and stage 1 through stage 4 chronic kidney disease, or unspecified chronic kidney disease: Secondary | ICD-10-CM | POA: Diagnosis not present

## 2021-08-08 DIAGNOSIS — I5042 Chronic combined systolic (congestive) and diastolic (congestive) heart failure: Secondary | ICD-10-CM

## 2021-08-08 DIAGNOSIS — R0602 Shortness of breath: Secondary | ICD-10-CM | POA: Insufficient documentation

## 2021-08-08 DIAGNOSIS — R630 Anorexia: Secondary | ICD-10-CM

## 2021-08-08 DIAGNOSIS — I4819 Other persistent atrial fibrillation: Secondary | ICD-10-CM

## 2021-08-08 DIAGNOSIS — I498 Other specified cardiac arrhythmias: Secondary | ICD-10-CM

## 2021-08-08 DIAGNOSIS — I5022 Chronic systolic (congestive) heart failure: Secondary | ICD-10-CM

## 2021-08-08 DIAGNOSIS — R079 Chest pain, unspecified: Secondary | ICD-10-CM

## 2021-08-08 HISTORY — DX: Heart failure, unspecified: I50.9

## 2021-08-08 LAB — COMPREHENSIVE METABOLIC PANEL
ALT: 19 U/L (ref 0–44)
AST: 24 U/L (ref 15–41)
Albumin: 3.5 g/dL (ref 3.5–5.0)
Alkaline Phosphatase: 71 U/L (ref 38–126)
Anion gap: 16 — ABNORMAL HIGH (ref 5–15)
BUN: 18 mg/dL (ref 8–23)
CO2: 27 mmol/L (ref 22–32)
Calcium: 10.7 mg/dL — ABNORMAL HIGH (ref 8.9–10.3)
Chloride: 99 mmol/L (ref 98–111)
Creatinine, Ser: 2.17 mg/dL — ABNORMAL HIGH (ref 0.44–1.00)
GFR, Estimated: 24 mL/min — ABNORMAL LOW (ref >60)
Glucose, Bld: 105 mg/dL — ABNORMAL HIGH (ref 70–99)
Potassium: 3.2 mmol/L — ABNORMAL LOW (ref 3.5–5.1)
Sodium: 142 mmol/L (ref 135–145)
Total Bilirubin: 0.9 mg/dL (ref 0.3–1.2)
Total Protein: 7.4 g/dL (ref 6.5–8.1)

## 2021-08-08 LAB — CBC WITH DIFFERENTIAL/PLATELET
Abs Immature Granulocytes: 0.03 10*3/uL (ref 0.00–0.07)
Basophils Absolute: 0 10*3/uL (ref 0.0–0.1)
Basophils Relative: 1 %
Eosinophils Absolute: 0.1 10*3/uL (ref 0.0–0.5)
Eosinophils Relative: 1 %
HCT: 34.8 % — ABNORMAL LOW (ref 36.0–46.0)
Hemoglobin: 10.7 g/dL — ABNORMAL LOW (ref 12.0–15.0)
Immature Granulocytes: 0 %
Lymphocytes Relative: 22 %
Lymphs Abs: 1.8 10*3/uL (ref 0.7–4.0)
MCH: 29.7 pg (ref 26.0–34.0)
MCHC: 30.7 g/dL (ref 30.0–36.0)
MCV: 96.7 fL (ref 80.0–100.0)
Monocytes Absolute: 0.8 10*3/uL (ref 0.1–1.0)
Monocytes Relative: 9 %
Neutro Abs: 5.6 10*3/uL (ref 1.7–7.7)
Neutrophils Relative %: 67 %
Platelets: 276 10*3/uL (ref 150–400)
RBC: 3.6 MIL/uL — ABNORMAL LOW (ref 3.87–5.11)
RDW: 16.8 % — ABNORMAL HIGH (ref 11.5–15.5)
WBC: 8.3 10*3/uL (ref 4.0–10.5)
nRBC: 0 % (ref 0.0–0.2)

## 2021-08-08 LAB — TROPONIN I (HIGH SENSITIVITY)
Troponin I (High Sensitivity): 88 ng/L — ABNORMAL HIGH (ref <18)
Troponin I (High Sensitivity): 81 ng/L — ABNORMAL HIGH (ref <18)

## 2021-08-08 LAB — BRAIN NATRIURETIC PEPTIDE: B Natriuretic Peptide: 146.5 pg/mL — ABNORMAL HIGH (ref 0.0–100.0)

## 2021-08-08 LAB — MAGNESIUM: Magnesium: 1.9 mg/dL (ref 1.7–2.4)

## 2021-08-08 MED ORDER — POTASSIUM CHLORIDE CRYS ER 20 MEQ PO TBCR
40.0000 meq | EXTENDED_RELEASE_TABLET | Freq: Once | ORAL | Status: AC
Start: 2021-08-08 — End: 2021-08-08
  Administered 2021-08-08: 40 meq via ORAL
  Filled 2021-08-08: qty 2

## 2021-08-08 MED ORDER — SODIUM CHLORIDE 0.9 % IV BOLUS
250.0000 mL | Freq: Once | INTRAVENOUS | Status: AC
Start: 2021-08-08 — End: 2021-08-08
  Administered 2021-08-08: 250 mL via INTRAVENOUS

## 2021-08-08 NOTE — Discharge Instructions (Addendum)
Medication Changes and Recommendations: ?- STOP Amiodarone ?- STOP Digoxin ?- STOP Hydralazine  ?- STOP Spironolactone and Torsemide for now until we can make sure your kidney function has improved. Please come by our office on Monday so we can recheck labs (BMET, Digoxin level, TSH). However, if you start to have worsening shortness of breath, lower extremity extremity edema, or your weight starts to go up, you can take 1/2 of your usual dose of Torsemide (20 mg daily).  ? ?

## 2021-08-08 NOTE — Addendum Note (Signed)
Addended by: Sande Rives on: 08/08/2021 04:32 PM ? ? Modules accepted: Orders ? ?

## 2021-08-08 NOTE — ED Triage Notes (Signed)
Pt had an episode of chest pain onset 1800 yesterday. Reports she took 4 home nitro tabs and had pain relief. Reports SHOB this am, denies chest pain today. Hx of afib, DM, breast cancer (restricted L arm), and was recently diagnosed with CHF.  ?

## 2021-08-08 NOTE — H&P (Deleted)
?Cardiology Consultation:  ? ?Stephanie Franco ID: Stephanie Franco ?MRN: [id number]; DOB: [birth year] ? ?Admit date: 08/08/2021 ?Date of Consult: 08/08/2021 ? ?PCP:  Stephanie Landsman, MD ?  ?[facility] Providers ?Cardiologist:  Stephanie Hew, MD / Stephanie Champagne, MD ? ?Stephanie Franco Profile:  ? ?Stephanie Franco is a 71 y.o. female with a history of CAD s/p prior stenting to LAD, chronic systolic CHF with EF 35-36%, permanent atrial fibrillation on Eliquis, hypertension, hyperlipidemia, type 2 diabetes mellitus, left breast cancer s/p remote mastectomy, and arthritis who is being seen 08/08/2021 for the evaluation of chest pain at the request of Dr. Sherry Franco. ? ?History of Present Illness:  ? ?Stephanie Franco is a 71 year old female with the above history who is followed by Stephanie Franco and Stephanie Franco. Stephanie Franco was recently admitted in 06/2021 after presenting with chest pain, worsening dyspnea, and orthopnea. High-sensitivity troponin peaked at 71.  First in the nightly similar results to you will you will get someone. R/LHC showed stable CAD with widely patent LAD stent, 90% stenosis of distal LCX, and 60% stenosis of the proximal to mid RCA. This was not felt to explain her degree of LV dysfunction. Also showed right > left filling pressures with low cardiac index of 1.89. Advanced CHF team was consulted and she wa at s started on Milrinone as well as Amiodarone for frequent PVC. She underwent further work-up of pulmonary hypertension and CHF. V/Q scan was not suggestive of chronic PE. Serologic workup negative (ANA, anti-centromere ab, anti-scl70, RF). TEE was done to better assess severity of her MR and showed restriction of the posterior mitral leaflet and moderate eccentric MR (posteriorly directed). 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 Cardiac MRI was ordered and was suggestive of mixed ischemic/ non-ischemic cardiomyopathy (questionable myocarditis with prior CAD) with LVEF of 36% a and I think  I have a person it varies frequently but I have a nd moderate MR. She was weaned off of milrinone w/ stable co-ox and transition to Torsemide. GDMT added. She was seen for follow-up in the CHF Clinic on 07/04/2021 at which time she reported some shortness of breath the day prior to visit as well as some atypical chest pain. She appeared volume overloaded on exam and weight was up 4 lbs. However, ReDs clinic was 27%. Toprol-XL was held due to fatigue and other medications were continued. ? ?Stephanie Franco presented to the ED today for a variety of complaints including chest pain.  Stephanie Franco states she has been feeling bad since being started on multiple medications last admission.  She states she initially felt okay after going home but then a few weeks ago started to have nausea, intermittent shortness of breath, lightheadedness, and dizziness.  She states it all started with nausea and multiple episodes of dry heaving (but no vomiting) and then she developed the other symptoms.  She has had a loss of appetite and poor PO intake.  Her husband also does not think she is staying well-hydrated.  She describes intermittent palpitations as well as some lightheadedness/dizziness not always associated with position changes but denies any syncope.  She also reports intermittent shortness of breath.  She initially said this is mostly at rest but then described dyspnea on exertion and states the other day she walked from her kitchen to her living room and felt like she had just walked 2 miles.  She has stable orthopnea and occasional PND since leaving the hospital.  No lower extremity edema.  Her weights have been stable at home.  She also reports having chest pain last night. Around 5 PM last night, she started having "nagging" chest pain on the left side of her chest.  She took 2 doses of sublingual Nitro and the pain resolved but then returned a couple hours later.  She again took 2 doses of sublingual Nitro and the pain resolved.   She states this pain felt different than her prior chest pain which worried her some and is why she decided to come to the ED.  Other than this episode of chest pain, she reports rare episodes of chest pain.  Husband is not convinced that her chest pain is always cardiac related as sometimes it improves with antacids.  Stephanie Franco has been recommended that the symptoms are due to medication changes. They think the Amiodarone is causing her loss of appetite and the Hydralazine is causing a lot of fatigue.  She denies any recent fevers or illnesses.  No nasal congestion, cough, vomiting, diarrhea.  No abnormal bleeding in urine or stools. ? ?Upon arrival to the ED, Stephanie Franco mildly bradycardic but vitals stable. EKG showed junctional rhythm, rate 54 bpm, with LVH, IVCD, and no acute ST/T changes. High-sensitivity troponin minimally elevated and flat at 88 >> 81. BNP mildly elevated at 146 (117 during admission last month). Chest x-ray showed cardiomegaly with pulmonary venous congestion but no frank pulmonary edema and old granulomatous disease. WBC 8.3, Hgb 10.7, Plts 276. Na 142, K 3.2, Glucose 105, BUN 18, Cr 2.17, LFTs normal. Anion gap of 16. Cardiology asked to see. ? ?At the time of this evaluation, Stephanie Franco is resting comfortably in no acute distress.  She denies any chest pain at this time and would prefer to go home. ? ?Past Medical History:  ?Diagnosis Date  ? Aortic atherosclerosis ([place])   ? Arthritis   ? Asthma   ? Atrial fibrillation, permanent ([place])   ? Rate control with Bystolic. CHA2DS2Vasc = 6 (HTN, DM, CHF, Age 65, Female) -> on Pradaxa  ? Breast cancer ([place]) 2008  ? S/P mastectomy  ? CAD S/P percutaneous coronary angioplasty 2006  ? PCI of circumflex with Taxus DES;; relook-cath Feb 2013: 50-60% short lesion in RCA, 40% ISR Circumflex stent.  ? CHF (congestive heart failure) ([place])   ? CKD (chronic kidney disease), stage III ([place])   ? Complication of anesthesia   ? prolonged sedation after colonoscopy in  [place]  ? Diabetes mellitus, uncontrolled 07/14/2011  ? Dilated cardiomyopathy ([place])   ? Mostly Resolved (EF up from ~25% to ~45% by Echo)  ? DOE (dyspnea on exertion)   ? Edema of both legs   ? Usually mild, chronic. Controlled with when necessary furosemide and diet  ? 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 "have that now"  ? GERD (gastroesophageal reflux disease)   ? Gout   ? Hyperlipidemia   ? Hypertension   ? Hypokalemia   ? Morbid obesity ([place])   ? BMI 41  ? OSA on CPAP   ? On CPAP  ? Pulmonary hypertension, unspecified ([place])   ? PAP ~90 mmHg on Echo 12/2017 -- has OSA on CPAP & obesity - but Overall Improved Sx of dyspnea / edema  ? Systolic murmur   ? ? ?Past Surgical History:  ?Procedure Laterality Date  ? COLONOSCOPY    ? CORONARY ANGIOPLASTY WITH STENT PLACEMENT  2006  ? stent to circumflex  ? DILATION AND CURETTAGE OF UTERUS    ? LEFT HEART  CATHETERIZATION WITH CORONARY ANGIOGRAM N/A 07/13/2011  ? Procedure: LEFT HEART CATHETERIZATION WITH CORONARY ANGIOGRAM;  Surgeon: Leonie Man, MD;  Location: [facility] CATH LAB;  Service: Cardiovascular;  normal EF; 34m 50-60% lesion in RCA; patent stent in circumflex form 2006 with ~40% in-stent re-stenosis  ? MASTECTOMY  2008  ? left  ? NM MYOVIEW LTD  7/'16; 6/'21  ? LOW RISK. No ischemia or infarction. Not gated 2/2  A. fib - unable to assess EF  ? RIGHT/LEFT HEART CATH AND CORONARY ANGIOGRAPHY N/A 06/23/2021  ? Procedure: RIGHT/LEFT HEART CATH AND CORONARY ANGIOGRAPHY;  Surgeon: BLorretta Harp MD;  [facility];  Service: Cardiovascular;  Laterality: N/A;  ? TEE WITHOUT CARDIOVERSION N/A 06/25/2021  ? Procedure: TRANSESOPHAGEAL ECHOCARDIOGRAM (TEE);  Surgeon: MLarey Dresser MD;  [facility];  Service: Cardiovascular;  Laterality: N/A;  ? TOTAL HIP ARTHROPLASTY Right 01/19/2018  ? Procedure: RIGHT TOTAL HIP ARTHROPLASTY ANTERIOR APPROACH;  Surgeon: SRod Can MD;  Location: [facility] ORS;  Service: Orthopedics;  Laterality: Right;  Needs RNFA  ?  TRANSTHORACIC ECHOCARDIOGRAM  6/'15; 9/'16  ? a) In setting of Urosepsis: EF ~25 %, global HK (poor quality study);; b) LV EF 40-45%, Mild Anteroseptal HK. Mod-Severe  RA dilation with elevated PA Pressure

## 2021-08-08 NOTE — ED Notes (Signed)
Pt verbalized understanding of d/c instructions, meds, and followup care. Denies questions. VSS, no distress noted. Steady gait to exit with all belongings.  ?

## 2021-08-08 NOTE — Progress Notes (Addendum)
Ordered BMET, Digoxin level, TSH to be checked in our office on Monday 08/11/2021. Please see consult note from today for more information. ? ?Darreld Mclean, PA-C ?08/08/2021 4:27 PM ? ?  ?

## 2021-08-08 NOTE — ED Provider Notes (Signed)
?[facility] ?Provider Note ? ? ?CSN: [id number] ?Arrival date & time: 08/08/21  7035 ? ?  ? ?History ? ?Chief Complaint  ?Patient presents with  ? Chest Pain  ? Shortness of Breath  ? ? ?Stephanie Franco is a 71 y.o. female. ? ?The history is provided by the patient and medical records. No language interpreter was used.  ?Chest Pain ?Pain location:  Substernal area and L chest ?Pain quality: aching, crushing and pressure   ?Pain radiates to:  Upper back ?Pain severity:  Severe ?Onset quality:  Sudden ?Timing:  Constant ?Progression:  Resolved ?Chronicity:  New ?Relieved by:  Nothing ?Worsened by:  Nothing ?Ineffective treatments:  None tried ?Associated symptoms: back pain, diaphoresis, fatigue, palpitations and shortness of breath   ?Associated symptoms: no abdominal pain, no altered mental status, no cough, no fever, no headache, no lower extremity edema, no nausea and no vomiting   ?Shortness of Breath ?Severity:  Severe ?Onset quality:  Gradual ?Duration:  1 day ?Timing:  Intermittent ?Progression:  Waxing and waning ?Relieved by:  Inhaler (mild help) ?Worsened by:  Nothing ?Ineffective treatments:  None tried ?Associated symptoms: chest pain and diaphoresis   ?Associated symptoms: no abdominal pain, no cough, no fever, no headaches, no neck pain, no rash, no vomiting and no wheezing   ?Risk factors: hx of cancer   ?Risk factors: no hx of PE/DVT   ? ?  ? ?Home Medications ?Prior to Admission medications   ?Medication Sig Start Date End Date Taking? Authorizing Provider  ?albuterol (PROVENTIL HFA;VENTOLIN HFA) 108 (90 Base) MCG/ACT inhaler Inhale 1-2 puffs into the lungs every 6 (six) hours as needed for wheezing or shortness of breath. 05/20/15   Harvel Quale, MD  ?albuterol (PROVENTIL) (2.5 MG/3ML) 0.083% nebulizer solution Take 3 mLs (2.5 mg total) by nebulization every 6 (six) hours as needed for wheezing or shortness of breath. 05/20/15   Harvel Quale, MD   ?allopurinol (ZYLOPRIM) 300 MG tablet Take 300 mg by mouth daily.    [provider]  ?amiodarone (PACERONE) 200 MG tablet Take 1 tablet (200 mg total) by mouth 2 (two) times daily for 10 days, THEN 1 tablet (200 mg total) daily. 06/27/21 07/07/22  Consuelo Pandy, PA-C  ?blood glucose meter kit and supplies KIT Dispense based on patient and insurance preference. Use up to four times daily as directed. (FOR ICD-9 250.00, 250.01). 04/22/18   Charlynne Cousins, MD  ?dapagliflozin propanediol (FARXIGA) 10 MG TABS tablet Take 1 tablet (10 mg total) by mouth daily. 06/28/21   Consuelo Pandy, PA-C  ?digoxin (LANOXIN) 0.125 MG tablet Take 0.5 tablets (0.0625 mg total) by mouth daily. Please cut the tablet in half 07/07/21   Rafael Bihari, FNP  ?ELIQUIS 5 MG TABS tablet TAKE 1 TABLET BY MOUTH TWICE A DAY 07/11/19   Leonie Man, MD  ?ezetimibe (ZETIA) 10 MG tablet Take 1 tablet (10 mg total) by mouth daily. 06/27/21   Consuelo Pandy, PA-C  ?ferrous sulfate 325 (65 FE) MG EC tablet Take 325 mg by mouth daily with breakfast.    [provider]  ?fexofenadine (ALLEGRA) 180 MG tablet Take 180 mg by mouth daily as needed for allergies.  04/30/15   [provider]  ?fluticasone (FLONASE) 50 MCG/ACT nasal spray Place 2 sprays into the nose daily as needed for allergies or rhinitis.     [provider]  ?gabapentin (NEURONTIN) 600 MG tablet Take 600 mg  by mouth daily.    [provider]  ?glucose blood test strip 1 each by Other route as needed for other. Use as instructed    [provider]  ?hydrALAZINE (APRESOLINE) 25 MG tablet Take 1 tablet (25 mg total) by mouth 3 (three) times daily. 06/27/21 06/27/22  Larey Dresser, MD  ?Insulin Glargine 300 UNIT/ML SOPN Inject 40 Units into the skin 2 (two) times daily before a meal.     [provider]  ?isosorbide mononitrate (IMDUR) 60 MG 24 hr tablet Take 1 tablet (60 mg total) by mouth daily. 07/04/21  08/03/21  Rafael Bihari, FNP  ?LORazepam (ATIVAN) 1 MG tablet Take 1 mg by mouth 2 (two) times daily. 07/07/16   [provider]  ?losartan (COZAAR) 25 MG tablet Take 1/2 tablet (12.5 mg total) by mouth daily. 06/28/21   Consuelo Pandy, PA-C  ?Multiple Vitamins-Minerals (MULTIVITAMIN WITH MINERALS) tablet Take 1 tablet by mouth daily.    [provider]  ?nitroGLYCERIN (NITROSTAT) 0.4 MG SL tablet DISSOLVE 1 TABLET UNDER THE TONGUE EVERY 5 MINUTES AS NEEDED FOR CHEST PAIN. 07/25/21   Leonie Man, MD  ?pantoprazole (PROTONIX) 40 MG tablet TAKE 1 TABLET BY MOUTH EVERY DAY 04/02/21   Leonie Man, MD  ?potassium chloride SA (KLOR-CON M20) 20 MEQ tablet TAKE 1 TABLET BY MOUTH TWICE A DAY, MAY TAKE ADDITIONAL 2 TABLETS AS NEEDED/DIRECTED 07/17/21   Leonie Man, MD  ?rosuvastatin (CRESTOR) 40 MG tablet Take 1 tablet (40 mg total) by mouth daily. 05/14/21 08/12/21  Leonie Man, MD  ?spironolactone (ALDACTONE) 25 MG tablet Take 1 tablet (25 mg total) by mouth daily. 06/27/21 09/25/21  Lyda Jester M, PA-C  ?torsemide (DEMADEX) 20 MG tablet Take 2 tablets (40 mg total) by mouth daily. 06/28/21   Consuelo Pandy, PA-C  ?triamcinolone ointment (KENALOG) 0.5 % Apply to legs 2 times daily for 2 weeks 05/02/18   Leonie Man, MD  ?   ? ?Allergies    ?Patient has no known allergies.   ? ?Review of Systems   ?Review of Systems  ?Constitutional:  Positive for appetite change (decraesed), diaphoresis and fatigue. Negative for chills and fever.  ?HENT:  Negative for congestion.   ?Eyes:  Negative for visual disturbance.  ?Respiratory:  Positive for chest tightness and shortness of breath. Negative for cough and wheezing.   ?Cardiovascular:  Positive for chest pain and palpitations. Negative for leg swelling.  ?Gastrointestinal:  Negative for abdominal pain, constipation, diarrhea, nausea and vomiting.  ?Genitourinary:  Negative for dysuria and flank pain.  ?Musculoskeletal:   Positive for back pain. Negative for neck pain and neck stiffness.  ?Skin:  Negative for rash and wound.  ?Neurological:  Positive for light-headedness. Negative for syncope and headaches.  ?Psychiatric/Behavioral:  Negative for agitation and confusion.   ?All other systems reviewed and are negative. ? ?Physical Exam ?Updated Vital Signs ?BP (!) 137/51 (BP Location: Right Arm)   Pulse (!) 54   Temp 98.3 ?F (36.8 ?C) (Oral)   Resp 18   Ht _0  (1.6 m)   Wt 83.9 kg   SpO2 97%   BMI 32.77 kg/m?  ?Physical Exam ?Vitals and nursing note reviewed.  ?Constitutional:   ?   General: She is not in acute distress. ?   Appearance: She is well-developed. She is not ill-appearing, toxic-appearing or diaphoretic.  ?HENT:  ?   Head: Normocephalic and atraumatic.  ?Eyes:  ?   Conjunctiva/sclera: Conjunctivae  normal.  ?   Pupils: Pupils are equal, round, and reactive to light.  ?Cardiovascular:  ?   Rate and Rhythm: Regular rhythm. Bradycardia present.  ?   Heart sounds: Murmur heard.  ?Pulmonary:  ?   Effort: Pulmonary effort is normal. No respiratory distress.  ?   Breath sounds: Rales present. No decreased breath sounds, wheezing or rhonchi.  ?Chest:  ?   Chest wall: No tenderness.  ?Abdominal:  ?   Palpations: Abdomen is soft.  ?   Tenderness: There is no abdominal tenderness.  ?Musculoskeletal:     ?   General: No swelling.  ?   Cervical back: Neck supple.  ?   Right lower leg: No edema.  ?   Left lower leg: No edema.  ?Skin: ?   General: Skin is warm and dry.  ?   Capillary Refill: Capillary refill takes less than 2 seconds.  ?   Findings: No erythema.  ?Neurological:  ?   General: No focal deficit present.  ?   Mental Status: She is alert.  ?Psychiatric:     ?   Mood and Affect: Mood normal.  ? ? ?ED Results / Procedures / Treatments   ?Labs ?(all labs ordered are listed, but only abnormal results are displayed) ?Labs Reviewed  ?CBC WITH DIFFERENTIAL/PLATELET - Abnormal; Notable for the following components:  ?     Result Value  ? RBC 3.60 (*)   ? Hemoglobin 10.7 (*)   ? HCT 34.8 (*)   ? RDW 16.8 (*)   ? All other components within normal limits  ?COMPREHENSIVE METABOLIC PANEL - Abnormal; Notable for the following components:  ? [place]

## 2021-08-08 NOTE — ED Notes (Signed)
Fluids finished. Crackers and water given. Tolerates well. ?

## 2021-08-08 NOTE — ED Provider Notes (Signed)
?  4:40 PM ?Patient signed out to me by previous ED physician. Pt is a 71 yo female presenting with chest pain that improved with sublingual nitro. Stable workup. Seen by cards.  ? ?Plan: Fluids and DC ?Physical Exam  ?BP (!) 125/42   Pulse (!) 51   Temp 98.3 ?F (36.8 ?C) (Oral)   Resp (!) 23   Ht '5\' 3"'$  (1.6 m)   Wt 83.9 kg   SpO2 97%   BMI 32.77 kg/m?  ? ?Physical Exam ?Vitals and nursing note reviewed.  ?Constitutional:   ?   General: She is not in acute distress. ?   Appearance: She is well-developed.  ?HENT:  ?   Head: Normocephalic and atraumatic.  ?Eyes:  ?   Conjunctiva/sclera: Conjunctivae normal.  ?Cardiovascular:  ?   Rate and Rhythm: Normal rate and regular rhythm.  ?   Heart sounds: No murmur heard. ?Pulmonary:  ?   Effort: Pulmonary effort is normal. No respiratory distress.  ?   Breath sounds: Normal breath sounds.  ?Abdominal:  ?   Palpations: Abdomen is soft.  ?   Tenderness: There is no abdominal tenderness.  ?Musculoskeletal:     ?   General: No swelling.  ?   Cervical back: Neck supple.  ?Skin: ?   General: Skin is warm and dry.  ?   Capillary Refill: Capillary refill takes less than 2 seconds.  ?Neurological:  ?   Mental Status: She is alert.  ?Psychiatric:     ?   Mood and Affect: Mood normal.  ? ? ?Procedures  ?Procedures ? ?ED Course / MDM  ?  ?Medical Decision Making ?Amount and/or Complexity of Data Reviewed ?Labs: ordered. ?Radiology: ordered. ? ? ?8:56 PM ?Seen by cardiology.  See their note for recommendations. ? ?Medication adjustments made to discharge summary. ? ?Patient given IV fluids and tolerating p.o. ? ?Patient in no distress and overall condition improved here in the ED. Detailed discussions were had with the patient regarding current findings, and need for close f/u with cardiologist. The patient has been instructed to return immediately if the symptoms worsen in any way for re-evaluation. Patient verbalized understanding and is in agreement with current care plan. All  questions answered prior to discharge. ? ? ? ? ? ?  ?Lianne Cure, DO ?98/92/11 2057 ? ?

## 2021-08-08 NOTE — ED Notes (Signed)
Pt ambulatory to and from restroom with steady gait and standby assistance. Pt denies SHOB. Assisted back into bed and placed back on cardiac monitor. ?

## 2021-08-08 NOTE — Consult Note (Addendum)
?Cardiology Consultation:  ?  ?Patient ID: Stephanie Franco ?MRN: [id number]; DOB: 31-Dec-1950 ?  ?Admit date: 08/08/2021 ?Date of Consult: 08/08/2021 ?  ?PCP:  Lin Landsman, MD ?             ?[facility] Providers ?Cardiologist:  Glenetta Hew, MD / Loralie Champagne, MD ?  ?Patient Profile:  ?  ?Stephanie Franco is a 71 y.o. female with a history of CAD s/p prior stenting to LAD, chronic systolic CHF with EF 40-34%, permanent atrial fibrillation on Eliquis, hypertension, hyperlipidemia, type 2 diabetes mellitus, left breast cancer s/p remote mastectomy, and arthritis who is being seen 08/08/2021 for the evaluation of chest pain at the request of Dr. Sherry Ruffing. ?  ?History of Present Illness:  ?  ?[name] is a 71 year old female with the above history who is followed by Dr. Ellyn Hack and Dr. Aundra Dubin. Patient was recently admitted in 06/2021 after presenting with chest pain, worsening dyspnea, and orthopnea. High-sensitivity troponin peaked at 71.  First in the nightly similar results to you will you will get someone. R/LHC showed stable CAD with widely patent LAD stent, 90% stenosis of distal LCX, and 60% stenosis of the proximal to mid RCA. This was not felt to explain her degree of LV dysfunction. Also showed right > left filling pressures with low cardiac index of 1.89. Advanced CHF team was consulted and she wa at s started on Milrinone as well as Amiodarone for frequent PVC. She underwent further work-up of pulmonary hypertension and CHF. V/Q scan was not suggestive of chronic PE. Serologic workup negative (ANA, anti-centromere ab, anti-scl70, RF). TEE was done to better assess severity of her MR and showed restriction of the posterior mitral leaflet and moderate eccentric MR (posteriorly directed). 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 Cardiac MRI was ordered and was suggestive of mixed ischemic/ non-ischemic cardiomyopathy (questionable myocarditis with prior CAD) with LVEF of  36% a and I think I have a person it varies frequently but I have a nd moderate MR. She was weaned off of milrinone w/ stable co-ox and transition to Torsemide. GDMT added. She was seen for follow-up in the CHF Clinic on 07/04/2021 at which time she reported some shortness of breath the day prior to visit as well as some atypical chest pain. She appeared volume overloaded on exam and weight was up 4 lbs. However, ReDs clinic was 27%. Toprol-XL was held due to fatigue and other medications were continued. ?  ?Patient presented to the ED today for a variety of complaints including chest pain.  Patient states she has been feeling bad since being started on multiple medications last admission.  She states she initially felt okay after going home but then a few weeks ago started to have nausea, intermittent shortness of breath, lightheadedness, and dizziness.  She states it all started with nausea and multiple episodes of dry heaving (but no vomiting) and then she developed the other symptoms.  She has had a loss of appetite and poor PO intake.  Her husband also does not think she is staying well-hydrated.  She describes intermittent palpitations as well as some lightheadedness/dizziness not always associated with position changes but denies any syncope.  She also reports intermittent shortness of breath.  She initially said this is mostly at rest but then described dyspnea on exertion and states the other day she walked from her kitchen to her living room and felt like she had just walked  2 miles.  She has stable orthopnea and occasional PND since leaving the hospital.  No lower extremity edema.  Her weights have been stable at home.  She also reports having chest pain last night. Around 5 PM last night, she started having "nagging" chest pain on the left side of her chest.  She took 2 doses of sublingual Nitro and the pain resolved but then returned a couple hours later.  She again took 2 doses of sublingual Nitro and  the pain resolved.  She states this pain felt different than her prior chest pain which worried her some and is why she decided to come to the ED.  Other than this episode of chest pain, she reports rare episodes of chest pain.  Husband is not convinced that her chest pain is always cardiac related as sometimes it improves with antacids.  Patient has been recommended that the symptoms are due to medication changes. They think the Amiodarone is causing her loss of appetite and the Hydralazine is causing a lot of fatigue.  She denies any recent fevers or illnesses.  No nasal congestion, cough, vomiting, diarrhea.  No abnormal bleeding in urine or stools. ?  ?Upon arrival to the ED, patient mildly bradycardic but vitals stable. EKG showed junctional rhythm, rate 54 bpm, with LVH, IVCD, and no acute ST/T changes. High-sensitivity troponin minimally elevated and flat at 88 >> 81. BNP mildly elevated at 146 (117 during admission last month). Chest x-ray showed cardiomegaly with pulmonary venous congestion but no frank pulmonary edema and old granulomatous disease. WBC 8.3, Hgb 10.7, Plts 276. Na 142, K 3.2, Glucose 105, BUN 18, Cr 2.17, LFTs normal. Anion gap of 16. Cardiology asked to see. ?  ?At the time of this evaluation, patient is resting comfortably in no acute distress.  She denies any chest pain at this time and would prefer to go home. ?  ?    ?Past Medical History:  ?Diagnosis Date  ? Aortic atherosclerosis ([place])    ? Arthritis    ? Asthma    ? Atrial fibrillation, permanent ([place])    ?  Rate control with Bystolic. CHA2DS2Vasc = 6 (HTN, DM, CHF, Age 43, Female) -> on Pradaxa  ? Breast cancer ([place]) 2008  ?  S/P mastectomy  ? CAD S/P percutaneous coronary angioplasty 2006  ?  PCI of circumflex with Taxus DES;; relook-cath Feb 2013: 50-60% short lesion in RCA, 40% ISR Circumflex stent.  ? CHF (congestive heart failure) ([place])    ? CKD (chronic kidney disease), stage III ([place])    ? Complication of anesthesia    ?   prolonged sedation after colonoscopy in [place]  ? Diabetes mellitus, uncontrolled 07/14/2011  ? Dilated cardiomyopathy ([place])    ?  Mostly Resolved (EF up from ~25% to ~45% by Echo)  ? DOE (dyspnea on exertion)    ? Edema of both legs    ?  Usually mild, chronic. Controlled with when necessary furosemide and diet  ? 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  "have that now"  ? GERD (gastroesophageal reflux disease)    ? Gout    ? Hyperlipidemia    ? Hypertension    ? Hypokalemia    ? Morbid obesity ([place])    ?  BMI 41  ? OSA on CPAP    ?  On CPAP  ? Pulmonary hypertension, unspecified ([place])    ?  PAP ~90 mmHg on Echo 12/2017 -- has OSA on CPAP & obesity -  but Overall Improved Sx of dyspnea / edema  ? Systolic murmur    ?  ?  ?     ?Past Surgical History:  ?Procedure Laterality Date  ? COLONOSCOPY      ? CORONARY ANGIOPLASTY WITH STENT PLACEMENT   2006  ?  stent to circumflex  ? DILATION AND CURETTAGE OF UTERUS      ? LEFT HEART CATHETERIZATION WITH CORONARY ANGIOGRAM N/A 07/13/2011  ?  Procedure: LEFT HEART CATHETERIZATION WITH CORONARY ANGIOGRAM;  Surgeon: Leonie Man, MD;  Location: [facility] CATH LAB;  Service: Cardiovascular;  normal EF; 50m 50-60% lesion in RCA; patent stent in circumflex form 2006 with ~40% in-stent re-stenosis  ? MASTECTOMY   2008  ?  left  ? NM MYOVIEW LTD   7/'16; 6/'21  ?  LOW RISK. No ischemia or infarction. Not gated 2/2  A. fib - unable to assess EF  ? RIGHT/LEFT HEART CATH AND CORONARY ANGIOGRAPHY N/A 06/23/2021  ?  Procedure: RIGHT/LEFT HEART CATH AND CORONARY ANGIOGRAPHY;  Surgeon: BLorretta Harp MD;  [facility];  Service: Cardiovascular;  Laterality: N/A;  ? TEE WITHOUT CARDIOVERSION N/A 06/25/2021  ?  Procedure: TRANSESOPHAGEAL ECHOCARDIOGRAM (TEE);  Surgeon: MLarey Dresser MD;  [facility];  Service: Cardiovascular;  Laterality: N/A;  ? TOTAL HIP ARTHROPLASTY Right 01/19/2018  ?  Procedure: RIGHT TOTAL HIP ARTHROPLASTY ANTERIOR APPROACH;  Surgeon: SRod Can MD;   Location: [facility] ORS;  Service: Orthopedics;  Laterality: Right;  Needs RNFA  ? TRANSTHORACIC ECHOCARDIOGRAM   6/'15; 9/'16  ?  a) In setting of Urosepsis: EF ~25 %, global HK (poor quality study);; b) LV EF

## 2021-08-11 ENCOUNTER — Other Ambulatory Visit: Payer: Self-pay

## 2021-08-11 DIAGNOSIS — I5022 Chronic systolic (congestive) heart failure: Secondary | ICD-10-CM

## 2021-08-11 DIAGNOSIS — N179 Acute kidney failure, unspecified: Secondary | ICD-10-CM

## 2021-08-11 DIAGNOSIS — I498 Other specified cardiac arrhythmias: Secondary | ICD-10-CM

## 2021-08-11 DIAGNOSIS — R5383 Other fatigue: Secondary | ICD-10-CM

## 2021-08-12 ENCOUNTER — Telehealth: Payer: Self-pay | Admitting: Student

## 2021-08-12 LAB — DIGOXIN LEVEL: Digoxin, Serum: 0.5 ng/mL (ref 0.5–0.9)

## 2021-08-12 LAB — TSH: TSH: 8.65 u[IU]/mL — ABNORMAL HIGH (ref 0.450–4.500)

## 2021-08-12 LAB — BASIC METABOLIC PANEL
BUN/Creatinine Ratio: 8 — ABNORMAL LOW (ref 12–28)
BUN: 11 mg/dL (ref 8–27)
CO2: 26 mmol/L (ref 20–29)
Calcium: 10.7 mg/dL — ABNORMAL HIGH (ref 8.7–10.3)
Chloride: 100 mmol/L (ref 96–106)
Creatinine, Ser: 1.45 mg/dL — ABNORMAL HIGH (ref 0.57–1.00)
Glucose: 80 mg/dL (ref 70–99)
Potassium: 3.9 mmol/L (ref 3.5–5.2)
Sodium: 142 mmol/L (ref 134–144)
eGFR: 38 mL/min/{1.73_m2} — ABNORMAL LOW (ref >59)

## 2021-08-12 NOTE — Telephone Encounter (Signed)
See result note.  

## 2021-08-12 NOTE — Telephone Encounter (Signed)
? ?  Pt and his pt returning call to get lab result ?

## 2021-08-15 ENCOUNTER — Ambulatory Visit (HOSPITAL_COMMUNITY)
Admission: RE | Admit: 2021-08-15 | Discharge: 2021-08-15 | Disposition: A | Discharge status: Home / Self Care | Payer: Medicare Other | Source: Ambulatory Visit | Attending: Family Medicine | Admitting: Family Medicine

## 2021-08-15 ENCOUNTER — Encounter (HOSPITAL_COMMUNITY): Payer: Self-pay

## 2021-08-15 VITALS — BP 116/60 | HR 54 | Ht 63.0 in | Wt 188.6 lb

## 2021-08-15 DIAGNOSIS — I272 Pulmonary hypertension, unspecified: Secondary | ICD-10-CM

## 2021-08-15 DIAGNOSIS — E785 Hyperlipidemia, unspecified: Secondary | ICD-10-CM | POA: Insufficient documentation

## 2021-08-15 DIAGNOSIS — I252 Old myocardial infarction: Secondary | ICD-10-CM | POA: Insufficient documentation

## 2021-08-15 DIAGNOSIS — R0600 Dyspnea, unspecified: Secondary | ICD-10-CM | POA: Diagnosis present

## 2021-08-15 DIAGNOSIS — I2721 Secondary pulmonary arterial hypertension: Secondary | ICD-10-CM | POA: Insufficient documentation

## 2021-08-15 DIAGNOSIS — Z7984 Long term (current) use of oral hypoglycemic drugs: Secondary | ICD-10-CM | POA: Diagnosis not present

## 2021-08-15 DIAGNOSIS — Z955 Presence of coronary angioplasty implant and graft: Secondary | ICD-10-CM | POA: Insufficient documentation

## 2021-08-15 DIAGNOSIS — I493 Ventricular premature depolarization: Secondary | ICD-10-CM | POA: Insufficient documentation

## 2021-08-15 DIAGNOSIS — Z79899 Other long term (current) drug therapy: Secondary | ICD-10-CM | POA: Insufficient documentation

## 2021-08-15 DIAGNOSIS — E1122 Type 2 diabetes mellitus with diabetic chronic kidney disease: Secondary | ICD-10-CM | POA: Insufficient documentation

## 2021-08-15 DIAGNOSIS — I4821 Permanent atrial fibrillation: Secondary | ICD-10-CM | POA: Insufficient documentation

## 2021-08-15 DIAGNOSIS — I255 Ischemic cardiomyopathy: Secondary | ICD-10-CM | POA: Diagnosis not present

## 2021-08-15 DIAGNOSIS — I5022 Chronic systolic (congestive) heart failure: Secondary | ICD-10-CM | POA: Insufficient documentation

## 2021-08-15 DIAGNOSIS — I340 Nonrheumatic mitral (valve) insufficiency: Secondary | ICD-10-CM | POA: Diagnosis not present

## 2021-08-15 DIAGNOSIS — Z9861 Coronary angioplasty status: Secondary | ICD-10-CM

## 2021-08-15 DIAGNOSIS — Z7901 Long term (current) use of anticoagulants: Secondary | ICD-10-CM | POA: Insufficient documentation

## 2021-08-15 DIAGNOSIS — N1831 Chronic kidney disease, stage 3a: Secondary | ICD-10-CM

## 2021-08-15 DIAGNOSIS — N183 Chronic kidney disease, stage 3 unspecified: Secondary | ICD-10-CM | POA: Insufficient documentation

## 2021-08-15 DIAGNOSIS — I420 Dilated cardiomyopathy: Secondary | ICD-10-CM | POA: Insufficient documentation

## 2021-08-15 DIAGNOSIS — M1990 Unspecified osteoarthritis, unspecified site: Secondary | ICD-10-CM | POA: Insufficient documentation

## 2021-08-15 DIAGNOSIS — I2510 Atherosclerotic heart disease of native coronary artery without angina pectoris: Secondary | ICD-10-CM | POA: Diagnosis not present

## 2021-08-15 DIAGNOSIS — I130 Hypertensive heart and chronic kidney disease with heart failure and stage 1 through stage 4 chronic kidney disease, or unspecified chronic kidney disease: Secondary | ICD-10-CM | POA: Diagnosis not present

## 2021-08-15 NOTE — Progress Notes (Addendum)
? ?ADVANCED HF CLINIC NOTE ? ? ?Primary Care: Lin Landsman, MD ?Primary Cardiologist: Dr. Ellyn Hack ?HF Cardiologist: Dr. Aundra Dubin ? ?HPI: ?Ms Mcwhirt is a 71 y.o. with history of permanent A fib for 20 years, DMII, HTN, Hyperlipidemia, left breast cancer (has mastectomy 20 years ago), right hip replacement, arthritis, and CAD prior PCI OM in 5102 and systolic heart failure.  ?  ?Had sleep study 05/2021 - negative for sleep apnea.  ? ?She was admitted 06/20/21 with chest pain, but also noted to have orthopnea and worsening dyspnea. HS Trop 71>58>51. Echo showed EF 25-30%, moderate RV dysfunction, moderate biatrial enlargement, at least moderate posteriorly-directed MR. LHC/RHC showed stable CAD with elevated R>L heart filling pressures and low cardiac index 1.89.  AHF team consulted. She was started on milrinone, PICC placed. Started on IV lasix and added amiodarone given frequent PVCs. Underwent further w/u of PH and HF. V/Q scan was not suggestive of chronic PE.  Serologic workup negative (ANA, anti-centromere ab, anti-scl70, RF). TEE was done to better assess severity of her MR. This showed restriction of the posterior mitral leaflet and moderate eccentric mitral regurgitation (posteriorly-directed). Normal LV size w/ global HK EF 35-40%, RV size normal w/ moderate systolic dysfunction, severe LAE. Suspect functional MR, possibly atrial functional MR. 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  LVEF on MRI 36%. MR moderate. She was weaned off of milrinone w/ stable co-ox and transition to PO diuretics, torsemide 40 mg daily. GDMT added. SCr stabilized ~1.9. She was discharged home, weight 188 lbs. ? ?Post hospital follow up 2/23, mildly volume up and instructed to take extra 20 mg of torsemide x 2 day and Imdur increased. SCr stable at 1.58 ? ?Seen in ED 3/23 with CP, SOB and elevated SCr. Cards saw, felt she was on the dry side.  Given gentle IVF, dig and amio stopped due to bradycardia/junctional rhythm, and hydral stopped due to fatigue.  ? ?Today she returns for HF follow up with her husband. Overall feeling fine. She has dyspnea walking further distances on flat ground. Has restarted torsemide x 1 day, appetite is improving and no more nausea. Denies palpitations, abnormal bleeding, CP, dizziness, edema, or PND/Orthopnea. No fever or chills. Weight at home 185-188 pounds. Taking all medications.  ? ?ECG (personally reviewed): SB 54 bpm ? ?Labs (2/23): K 4.0, creatinine 1.97 ?Labs (3/23): K 3.9, creatinine 1.45 ? ?Cardiac Testing ?- VQ scan (2/23): no evidence for chronic PE ? ?- R/LHC (2/23): RA 21, PA 80/30, CO 3.6 , CI 1.9. Prox RCA to Mid RCA lesion is 60% stenosed.  Dist Cx lesion is 90% stenosed. Previously placed Mid LAD stent (unknown type) is  widely patent. ? ?- cMRI (2/23): findings suggest mixed ischemic/nonischemic cardiomyopathy (?myocarditis with prior coronary disease).  LVEF on MRI 36%. MR moderate.  ? ?- Echo (2/23): EF 25% RV moderately reduced, LA severely dilated, RA moderately dilated, MV restricted posterior leaflet . Moderate MR.  ? ?- Echo (2020): EF 50-55%  LA and RA moderately dilated. ? ?- Echo (2019): EF 40-45% RV moderately dilated.  ? ?Past Medical History:  ?Diagnosis Date  ? Aortic atherosclerosis ([place])   ? Arthritis   ? Asthma   ? Atrial fibrillation, permanent ([place])   ? Rate control with Bystolic. CHA2DS2Vasc = 6 (HTN, DM, CHF, Age 45, Female) -> on Pradaxa  ? Breast cancer ([place]) 2008  ? S/P mastectomy  ? CAD S/P percutaneous coronary angioplasty 2006  ?  PCI of circumflex with Taxus DES;; relook-cath Feb 2013: 50-60% short lesion in RCA, 40% ISR Circumflex stent.  ? CHF (congestive heart failure) ([place])   ? CKD (chronic kidney disease), stage III ([place])   ? Complication of anesthesia   ? prolonged sedation after colonoscopy in [place]  ? Diabetes mellitus, uncontrolled 07/14/2011  ? Dilated cardiomyopathy  ([place])   ? Mostly Resolved (EF up from ~25% to ~45% by Echo)  ? DOE (dyspnea on exertion)   ? Edema of both legs   ? Usually mild, chronic. Controlled with when necessary furosemide and diet  ? 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 "have that now"  ? GERD (gastroesophageal reflux disease)   ? Gout   ? Hyperlipidemia   ? Hypertension   ? Hypokalemia   ? Morbid obesity ([place])   ? BMI 41  ? OSA on CPAP   ? On CPAP  ? Pulmonary hypertension, unspecified ([place])   ? PAP ~90 mmHg on Echo 12/2017 -- has OSA on CPAP & obesity - but Overall Improved Sx of dyspnea / edema  ? Systolic murmur   ? ?Current Outpatient Medications  ?Medication Sig Dispense Refill  ? acetaminophen (TYLENOL) 500 MG tablet Take 1,000 mg by mouth every 6 (six) hours as needed for mild pain.    ? albuterol (PROVENTIL HFA;VENTOLIN HFA) 108 (90 Base) MCG/ACT inhaler Inhale 1-2 puffs into the lungs every 6 (six) hours as needed for wheezing or shortness of breath. 1 Inhaler 0  ? albuterol (PROVENTIL) (2.5 MG/3ML) 0.083% nebulizer solution Take 3 mLs (2.5 mg total) by nebulization every 6 (six) hours as needed for wheezing or shortness of breath. 75 mL 12  ? allopurinol (ZYLOPRIM) 300 MG tablet Take 300 mg by mouth daily.    ? blood glucose meter kit and supplies KIT Dispense based on patient and insurance preference. Use up to four times daily as directed. (FOR ICD-9 250.00, 250.01). 1 each 0  ? dapagliflozin propanediol (FARXIGA) 10 MG TABS tablet Take 1 tablet (10 mg total) by mouth daily. 30 tablet 5  ? ELIQUIS 5 MG TABS tablet TAKE 1 TABLET BY MOUTH TWICE A DAY 180 tablet 2  ? ezetimibe (ZETIA) 10 MG tablet Take 1 tablet (10 mg total) by mouth daily. 30 tablet 5  ? fexofenadine (ALLEGRA) 180 MG tablet Take 180 mg by mouth daily as needed for allergies.   3  ? fluticasone (FLONASE) 50 MCG/ACT nasal spray Place 2 sprays into the nose daily as needed for allergies or rhinitis.     ? gabapentin (NEURONTIN) 600 MG tablet Take 600 mg by mouth daily.    ? glucose blood  test strip 1 each by Other route as needed for other. Use as instructed    ? isosorbide mononitrate (IMDUR) 60 MG 24 hr tablet Take 1 tablet (60 mg total) by mouth daily. 30 tablet 6  ? LORazepam (ATIVAN) 1 MG tablet Take 1 mg by mouth 2 (two) times daily.    ? losartan (COZAAR) 25 MG tablet Take 1/2 tablet (12.5 mg total) by mouth daily. 30 tablet 5  ? Multiple Vitamins-Minerals (MULTIVITAMIN WITH MINERALS) tablet Take 1 tablet by mouth daily.    ? nitroGLYCERIN (NITROSTAT) 0.4 MG SL tablet DISSOLVE 1 TABLET UNDER THE TONGUE EVERY 5 MINUTES AS NEEDED FOR CHEST PAIN. 25 tablet 6  ? Oxycodone HCl 10 MG TABS Take 5 mg by mouth 3 (three) times daily as needed for pain.    ? pantoprazole (PROTONIX) 40 MG tablet  TAKE 1 TABLET BY MOUTH EVERY DAY 90 tablet 3  ? rosuvastatin (CRESTOR) 40 MG tablet Take 1 tablet (40 mg total) by mouth daily. 90 tablet 3  ? spironolactone (ALDACTONE) 25 MG tablet Take by mouth.    ? TOUJEO SOLOSTAR 300 UNIT/ML Solostar Pen Inject 40 Units into the skin 2 (two) times daily.    ? triamcinolone ointment (KENALOG) 0.5 % Apply to legs 2 times daily for 2 weeks 30 g 0  ? ?No current facility-administered medications for this encounter.  ? ?No Known Allergies ? ?Social History  ? ?Socioeconomic History  ? Marital status: Married  ?  Spouse name: Not on file  ? Number of children: 4  ? Years of education: Not on file  ? Highest education level: Not on file  ?Occupational History  ? Not on file  ?Tobacco Use  ? Smoking status: Former  ?  Packs/day: 0.50  ?  Years: 6.00  ?  Pack years: 3.00  ?  Types: Cigarettes  ?  Quit date: 05/18/1992  ?  Years since quitting: 29.2  ? Smokeless tobacco: Never  ? Tobacco comments:  ?  Former smoker 08/15/21  ?Vaping Use  ? Vaping Use: Never used  ?Substance and Sexual Activity  ? Alcohol use: No  ?  Comment: 07/13/11 "have drank occasionally; not now"  ? Drug use: No  ? Sexual activity: Not Currently  ?  Birth control/protection: Surgical  ?Other Topics Concern  ? Not  on file  ?Social History Narrative  ? She is a married mother of 31, grandmother 2. Usually accompanied by her husband. She does not work. She had been working on her exercise, but is no longer as active. [place]

## 2021-08-18 ENCOUNTER — Telehealth (HOSPITAL_COMMUNITY): Payer: Self-pay | Admitting: *Deleted

## 2021-08-18 NOTE — Addendum Note (Signed)
Addended by: Harvie Junior on: 08/18/2021 03:25 PM ? ? Modules accepted: Orders ? ?

## 2021-08-18 NOTE — Telephone Encounter (Signed)
Received a vm from [facility] that patient wanted a call from our office about some dizziness/lightheadedness she was experiencing. Pt told HHRN symptoms occurred after change in diuretic. I called pt back to get more information. No answer/left vm for return call.  ?

## 2021-08-18 NOTE — Telephone Encounter (Signed)
Spoke with pts husband. Pts bp 110/54 and she is very dizzy since starting spiro '25mg'$  decreasing to 12.'5mg'$  helped a little but still dizzy. Per Allena Katz, FNP stop spiro monitor weight and BP call with any symptoms. Husband aware and agreeable.  ?

## 2021-08-19 ENCOUNTER — Telehealth: Payer: Self-pay | Admitting: Cardiology

## 2021-08-19 NOTE — Telephone Encounter (Signed)
Spoke to patient's husband advised wife can take Meclizine if needed she does not need a prescription.Advised to hold Spironolactone as directed.She should take Losartan as prescribed.Advised to continue to monitor B/P and call back if B/P low.Keep appointment with Dr.Harding  4/18 at 9:40 am.Bring B/P readings and all medications to appointment. ?

## 2021-08-19 NOTE — Telephone Encounter (Signed)
He can actually pick up meclizine OTC at any pharmacy ?

## 2021-08-19 NOTE — Telephone Encounter (Signed)
Spoke to patient's husband he stated he rechecked B/P 10 mins ago 115/63 pulse 64.Stated wife feels like she did when she had vertigo.Feels like objects spinning around at times.Stated she does not have a PCP at this time.She is trying to find a new one.He would like a prescription for Meclizine until she gets a new PCP.Advised I will send message back to pharmacist for advice. ?

## 2021-08-19 NOTE — Telephone Encounter (Signed)
Patient held both losartan and spironolactone? She is not on many other meds for HTN.  How is her pressure now? ?

## 2021-08-19 NOTE — Telephone Encounter (Signed)
Received a call from patient's husband calling to report wife has been dizzy and light headed today.B/P 94/87 pulse 56.Stated she was told yesterday to hold Spironolactone.Advised to hold Losartan.I will send message to pharmacist for advice.Appointment scheduled with Dr.Harding 4/18 at 9:40 am.  ?

## 2021-08-19 NOTE — Telephone Encounter (Signed)
Pt c/o BP issue: STAT if pt c/o blurred vision, one-sided weakness or slurred speech ? ?1. What are your last 5 BP readings?  ?94/87 around 11:30 am this morning  ? ? ?2. Are you having any other symptoms (ex. Dizziness, headache, blurred vision, passed out)? She is very Dizziness and weak  ? ?3. What is your BP issue? PT bp is was low this morning.  Husband stated they took her off on of her med yesterday and they are wonder if she need to take her bp med with her bp so low ? ?Best number [phone] ?

## 2021-08-21 NOTE — Telephone Encounter (Signed)
Recommended meclizine dosing for vertigo is as follows: 12.5 to 25 mg every 6 to 12 hours as needed; may increase to 50 mg per dose as needed based on response and tolerability; maximum dose: 100 mg/day ?

## 2021-08-21 NOTE — Telephone Encounter (Signed)
? ?  Pt's spouse calling back, he said pt's vertigo improved when she takes Meclizine, however, later in the day pt still feels dizzy and wanted to ask if pt can take Meclizine 2 times a day? ?

## 2021-08-21 NOTE — Telephone Encounter (Signed)
Attempted to contacted patient husband. LVM with message from [facility].  ?Left call back number.  ? ?

## 2021-08-21 NOTE — Telephone Encounter (Signed)
Contacted patient husband, he states that the meclizine is working, but later in the day it seems to wear off and he was reading the instructions that say some patients with vertigo have to take it twice daily. I advised normally its only once, but I would check with PHARMD to advise further. Patient has appointment with [place] on 09/02/2021.  ?Patient husband aware I will call back with update, advised to check with PCP on this as well. ? ?They states BP/HR are doing well, the vertigo is causing the dizziness. I did not see Meclizine on her med list, she is taking 25 mg.  ? ? ?

## 2021-08-25 NOTE — Telephone Encounter (Signed)
Spoke with pt husband, aware she can take 100 mg/day. He reports today she is having double vision with the dizziness. She has a follow up appointment 09/02/21. He is asking for antibiotics which had helped with her vertigo in the past. Aware they would need to check with the medical doctor or go to the urgent care.  ?

## 2021-08-25 NOTE — Telephone Encounter (Signed)
? ?  Pt's spouse is returning call  ?

## 2021-09-02 ENCOUNTER — Ambulatory Visit (INDEPENDENT_AMBULATORY_CARE_PROVIDER_SITE_OTHER): Payer: Medicare Other | Admitting: Cardiology

## 2021-09-02 ENCOUNTER — Encounter: Payer: Self-pay | Admitting: Cardiology

## 2021-09-02 VITALS — BP 130/74 | HR 61 | Ht 63.0 in | Wt 184.2 lb

## 2021-09-02 DIAGNOSIS — E1169 Type 2 diabetes mellitus with other specified complication: Secondary | ICD-10-CM | POA: Diagnosis not present

## 2021-09-02 DIAGNOSIS — Z9861 Coronary angioplasty status: Secondary | ICD-10-CM

## 2021-09-02 DIAGNOSIS — I340 Nonrheumatic mitral (valve) insufficiency: Secondary | ICD-10-CM

## 2021-09-02 DIAGNOSIS — I5042 Chronic combined systolic (congestive) and diastolic (congestive) heart failure: Secondary | ICD-10-CM

## 2021-09-02 DIAGNOSIS — I4821 Permanent atrial fibrillation: Secondary | ICD-10-CM

## 2021-09-02 DIAGNOSIS — N1831 Chronic kidney disease, stage 3a: Secondary | ICD-10-CM

## 2021-09-02 DIAGNOSIS — I420 Dilated cardiomyopathy: Secondary | ICD-10-CM | POA: Diagnosis not present

## 2021-09-02 DIAGNOSIS — I5022 Chronic systolic (congestive) heart failure: Secondary | ICD-10-CM

## 2021-09-02 DIAGNOSIS — E785 Hyperlipidemia, unspecified: Secondary | ICD-10-CM

## 2021-09-02 DIAGNOSIS — I214 Non-ST elevation (NSTEMI) myocardial infarction: Secondary | ICD-10-CM

## 2021-09-02 DIAGNOSIS — I2510 Atherosclerotic heart disease of native coronary artery without angina pectoris: Secondary | ICD-10-CM

## 2021-09-02 DIAGNOSIS — I200 Unstable angina: Secondary | ICD-10-CM

## 2021-09-02 DIAGNOSIS — I272 Pulmonary hypertension, unspecified: Secondary | ICD-10-CM

## 2021-09-02 DIAGNOSIS — G4733 Obstructive sleep apnea (adult) (pediatric): Secondary | ICD-10-CM

## 2021-09-02 NOTE — Progress Notes (Signed)
? ? ?Primary Care Provider: Lin Landsman, MD ?Cardiologist: Glenetta Hew, MD ?Electrophysiologist: None ?[facility] -> Dr. Mickey Farber ? ?Clinic Note: ?Chief Complaint  ?Patient presents with  ? Hospitalization Follow-up  ?  32-monthfollow-up after 1 hospitalization and 1 ER visit; now being followed by advanced heart failure team  ? Cardiomyopathy  ?  Likely mixed based on cardiac MRI results-EF 36%  ? Congestive Heart Failure  ?  Monitoring weights closely.  Has actually lost 7 pounds from her baseline.  Still has exertional dyspnea, but improved  ? ? ?=================================== ? ?ASSESSMENT/PLAN  ? ?Problem List Items Addressed This Visit   ? ?  ? Cardiology Problems  ? CAD S/P percutaneous coronary angioplasty (Chronic)  ?  DES PCI to the LAD back in 2005.  Stent still patent as of 2023.  There is moderate diffuse disease in the RCA as well as distal LCx being treated medically. ? ?Plan:  ?Continue rosuvastatin and Zetia along with low-dose ARB. ?Not on beta-blocker because of fatigue and bradycardia issues.  Is on Imdur alone for potential antianginal benefit. ?Not on aspirin or Plavix because of long-term Eliquis use for A-fib. ?  ?  ? Permanent atrial fibrillation: CHA2DS2-VASc Score 6 (Chronic)  ?  Essentially permanent A-fib.  Had previously been on Bystolic for, now no longer on any medication for rate control.  Beta-blocker stopped because of fatigue and bradycardia. ?For now we will hold off, but I suspect that we can probably get her back on potentially carvedilol if her pressures and heart rate remained stable. ? ?Remains on Eliquis with no bleeding issues. ? ?  ?  ? Chronic combined systolic and diastolic CHF, NYHA class 2 and [place]  ?  Unfortunately, after working her EF back up to 50-55% as of 2020, her EF is now back down to roughly 35% by echo, TEE and cardiac MRI.  Biventricular failure noted along with elevated PAP. ? ?Being followed by advanced  heart failure team.  Seems relatively euvolemic on exam with weight loss.  Probably back to NYHA class II symptoms at this point.  Somewhat limited by side effects of medications. ?Digoxin was discontinued due to bradycardia as well was beta-blocker.  I think we may build to restart beta-blocker and follow-up if pressure tolerates-consider low-dose carvedilol. ? ?She is only on minimal dose of losartan which she is taking in the evening.  As renal function stabilizes, I suspect that this can be titrated up since she is no longer on hydralazine for additional afterload reduction.  Is only on Imdur. ?No longer on spironolactone because of dizziness.  She is only on Farxiga and standing dose torsemide with sliding scale dosing.  Thankfully, her weights are stable. ? ?Plan: Continue with current medications, deferring titration of ARB versus recent initiation of beta-blocker to the advanced heart failure team-she is due to see Dr. MAundra Dubinnext month. ? ?She will need to have CMP BMP and lipids checked-hopefully that can be done at their visit ? ?  ?  ? Relevant Orders  ? Lipid panel  ? Comprehensive metabolic panel  ? Dilated cardiomyopathy (HCC) (Chronic)  ? Relevant Orders  ? Lipid panel  ? Comprehensive metabolic panel  ? Moderate mitral regurgitation (Chronic)  ?  Has had intermittent episodes of moderate-moderate to severe MR over the years depending on her EF and CHF standing.  In 2019 when her EF dropped she had more significant MR that improved after resolution  of her CHF and cardiomyopathy.  She is now back with cardiomyopathy and again has moderate posteriorly directed MR jet.  Question the significance of the 90% distal circumflex lesion with posterior leaflet tethering.  Also thought to be functional with atrial fibrillation. ? ?Continue to monitor, if it progresses would potentially consider MitraClip. ? ?  ?  ? Hyperlipidemia associated with type 2 diabetes mellitus (HCC) (Chronic)  ?  Lipids as of December  were borderline control with an LDL of 73.  She is now also on Zetia along with her rosuvastatin.  With weight loss, dietary changes and trying to increase exercise, hopefully we can get back down below 70 if not closer to 55 LDL.  Would be reluctant to add additional medication beyond her current dosage-she was converted from atorvastatin to rosuvastatin already. ? ?Continuing on Farxiga plus Toujeo insulin for diabetes. ? ?  ?  ? Relevant Orders  ? Lipid panel  ? Comprehensive metabolic panel  ? Pulmonary hypertension (HCC) (Chronic)  ?  This is previously been noted.  Her sleep study was relatively normal this time around.  Especially with her weight loss, possible that she requires CPAP. ?Right heart cath numbers were somewhat concerning, however in the setting of acute CHF exacerbation with new recurrent cardiomyopathy, it is difficult to know what to make of it in that setting. ? ?Plan is to continue aggressive CHF management and to reassess right heart cath in the future. ? ?  ?  ? NSTEMI (non-ST elevated myocardial infarction) ([place]) (Chronic)  ?  Distant history of non-STEMI.  Had PCI years ago to LAD.Marland Kitchen  Cardiac MRI suggests prior infarct in the anterior wall that is subendocardial which goes along with non-STEMI. ? ?This would make her more susceptible to further insults. ? ?Follow-up In February 2023 showed moderate RCA and distal LCx disease with patent LAD stent.. ? ?  ?  ?  ? Other  ? Obstructive sleep apnea syndrome (Chronic)  ?  Had previously been on CPAP, no longer on it currently.  In January her sleep study showed only mild OSA with recommendation for CPAP. ? ?  ?  ? Chronic kidney disease (CKD), stage III (moderate) (HCC) (Chronic)  ?  Continue to follow-up with nephrology.  As renal function stabilizes, we can probably titrate back up ARB dose and potassium consider Entresto. ? ?She is tolerating [place] well. ? ?  ?  ? ? ?=================================== ? ?HPI:   ? ?Stephanie Franco is a 71  y.o. female with a PMH notable for PERMANENT AFib, chronic HFpEF now HFrEF with new onset cardiomyopathy, CAD-PCI, HTN, HLD, DM-2, mild OSA (without recommendation for CPAP) who presents today for 4-5 month follow-up with me, but 1 month cardiology follow-up. ? ?I last saw Stephanie Franco in November 2022 as an annual follow-up because she was doing well.  She indicated that her swelling was well controlled.  Weights were stable.  Did not want to run on medications because when she does her edema gets worse.  No PND or orthopnea.  She had had some worsening edema and increased her diuretic dosing plus metolazone with resolution.  Noted occasional tachycardic symptoms but nothing prolonged.  Usually with exertion.  No chest pain or pressure.  No PND, orthopnea or edema.  Intermittent gouty flares. ?-> At that time the most recent echocardiogram had been September 2020 with a EF of 50 to 55% up from 40 to 45%.  Moderate biatrial enlargement.  Moderate MR and  mild to moderate TR. ? ?Recent Hospitalizations/Clinic Visits:  ?2/3-02/2022: Admitted with chest pain, along with orthopnea and dyspnea.  Minimal troponin elevation.  Echo showed reduction in EF down to 25 from 30% => R&LHC with stable CAD, elevated right greater than left-sided heart filling pressures with low cardiac index is 1.89. => Dr. Aundra Dubin of the Advanced Heart Failure Team consulted and took over management. ?Started on milrinone along with IV Lasix and amiodarone for frequent PVCs.  => Weaned off milrinone transferred to p.o. torsemide 40 mg daily.  Creatinine stabilized at 1.9. ?Evaluated for etiology of pulmonary hypertension => plan was to repeat right heart cath in the outpatient setting to fully evaluate for pulmonary arterial versus pulmonary venous hypertension. 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 ? ?Advanced heart failure follow-up  07/04/2021: Feeling well since discharge some dyspnea and atypical chest pain for which she took nitroglycerin x3.  Thought to be related to anxiety.  Also intermittent palpitations.  PND noted.  Fatigu

## 2021-09-02 NOTE — Patient Instructions (Addendum)
Medication Instructions:  ? ?NO CHANGES ? ?*If you need a refill on your cardiac medications before your next appointment, please call your pharmacy* ? ? ?Lab Work: ?CMP ?LIPID ?If you have labs (blood work) drawn today and your tests are completely normal, you will receive your results only by: ?MyChart Message (if you have MyChart) OR ?A paper copy in the mail ?If you have any lab test that is abnormal or we need to change your treatment, we will call you to review the results. ? ? ?Testing/Procedures: ? ?NOT NEEDED ? ?Follow-Up: ?At [facility], you and your health needs are our priority.  As part of our continuing mission to provide you with exceptional heart care, we have created designated Provider Care Teams.  These Care Teams include your primary Cardiologist (physician) and Advanced Practice Providers (APPs -  Physician Assistants and Nurse Practitioners) who all work together to provide you with the care you need, when you need it. ? ?  ? ?Your next appointment:   ?5 month(s) ? ?The format for your next appointment:   ?In Person ? ?Provider:   ?Glenetta Hew, MD  ? ? ?Other Instructions  ?

## 2021-09-12 ENCOUNTER — Telehealth: Payer: Self-pay | Admitting: Cardiology

## 2021-09-12 NOTE — Telephone Encounter (Signed)
Spoke with pt husband, those blood pressures he gave were from yesterday and he reports this morning her blood pressure was better, at 105/60. When her blood pressure got low she did have nausea. She had not drank much yesterday either and she did drink a little more and felt better. He is wanting to know if her blood pressure is low can they hold the losartan. She is taking 12.5 mg once daily. Okay given for him to hold the losartan if her blood pressure is low. He wants to let dr harding know she is doing good. Aware will forward to dr harding to review. ?

## 2021-09-12 NOTE — Telephone Encounter (Signed)
Patient is currently taking 12.5 mg or 1/2 losartan daily ?

## 2021-09-12 NOTE — Telephone Encounter (Signed)
Agree - ok to take 1/2 tab.  ?Would just have her reduce to 1/2 tab daily as standing. ? ?DH ?

## 2021-09-12 NOTE — Telephone Encounter (Signed)
Pt c/o BP issue: STAT if pt c/o blurred vision, one-sided weakness or slurred speech ? ?1. What are your last 5 BP readings?  ?91/59 ?88/51 ?97/58 ? ?2. Are you having any other symptoms (ex. Dizziness, headache, blurred vision, passed out)? She feels tired.  ? ?3. What is your BP issue? Patient's husband called wanting to know if her blood pressure is low should he withhold giving her losartan (COZAAR) 25 MG tablet.  ?

## 2021-09-12 NOTE — Telephone Encounter (Signed)
Left message for pt to call.

## 2021-09-14 ENCOUNTER — Encounter: Payer: Self-pay | Admitting: Cardiology

## 2021-09-14 NOTE — Assessment & Plan Note (Signed)
DES PCI to the LAD back in 2005.  Stent still patent as of 2023.  There is moderate diffuse disease in the RCA as well as distal LCx being treated medically. ? ?Plan:  ?? Continue rosuvastatin and Zetia along with low-dose ARB. ?? Not on beta-blocker because of fatigue and bradycardia issues.  Is on Imdur alone for potential antianginal benefit. ?? Not on aspirin or Plavix because of long-term Eliquis use for A-fib. ?

## 2021-09-14 NOTE — Assessment & Plan Note (Signed)
Distant history of non-STEMI.  Had PCI years ago to LAD.Stephanie Franco  Cardiac MRI suggests prior infarct in the anterior wall that is subendocardial which goes along with non-STEMI. ? ?This would make her more susceptible to further insults. ? ?Follow-up In February 2023 showed moderate RCA and distal LCx disease with patent LAD stent.Stephanie Franco ?

## 2021-09-14 NOTE — Assessment & Plan Note (Signed)
Unfortunately, after working her EF back up to 50-55% as of 2020, her EF is now back down to roughly 35% by echo, TEE and cardiac MRI.  Biventricular failure noted along with elevated PAP. ? ?Being followed by advanced heart failure team.  Seems relatively euvolemic on exam with weight loss.  Probably back to NYHA class II symptoms at this point.  Somewhat limited by side effects of medications. ?Digoxin was discontinued due to bradycardia as well was beta-blocker.  I think we may build to restart beta-blocker and follow-up if pressure tolerates-consider low-dose carvedilol. ? ?She is only on minimal dose of losartan which she is taking in the evening.  As renal function stabilizes, I suspect that this can be titrated up since she is no longer on hydralazine for additional afterload reduction.  Is only on Imdur. ?No longer on spironolactone because of dizziness.  She is only on Farxiga and standing dose torsemide with sliding scale dosing.  Thankfully, her weights are stable. ? ?Plan: Continue with current medications, deferring titration of ARB versus recent initiation of beta-blocker to the advanced heart failure team-she is due to see Dr. Aundra Dubin next month. ? ?She will need to have CMP BMP and lipids checked-hopefully that can be done at their visit ?

## 2021-09-14 NOTE — Assessment & Plan Note (Signed)
Had previously been on CPAP, no longer on it currently.  In January her sleep study showed only mild OSA with recommendation for CPAP. ?

## 2021-09-14 NOTE — Assessment & Plan Note (Signed)
Essentially permanent A-fib.  Had previously been on Bystolic for, now no longer on any medication for rate control.  Beta-blocker stopped because of fatigue and bradycardia. ?For now we will hold off, but I suspect that we can probably get her back on potentially carvedilol if her pressures and heart rate remained stable. ? ?Remains on Eliquis with no bleeding issues. ?

## 2021-09-14 NOTE — Assessment & Plan Note (Addendum)
Continue to follow-up with nephrology.  As renal function stabilizes, we can probably titrate back up ARB dose and potassium consider Entresto. ? ?She is tolerating [place] well. ?

## 2021-09-14 NOTE — Assessment & Plan Note (Signed)
This is previously been noted.  Her sleep study was relatively normal this time around.  Especially with her weight loss, possible that she requires CPAP. ?Right heart cath numbers were somewhat concerning, however in the setting of acute CHF exacerbation with new recurrent cardiomyopathy, it is difficult to know what to make of it in that setting. ? ?Plan is to continue aggressive CHF management and to reassess right heart cath in the future. ?

## 2021-09-14 NOTE — Assessment & Plan Note (Signed)
Has had intermittent episodes of moderate-moderate to severe MR over the years depending on her EF and CHF standing.  In 2019 when her EF dropped she had more significant MR that improved after resolution of her CHF and cardiomyopathy.  She is now back with cardiomyopathy and again has moderate posteriorly directed MR jet.  Question the significance of the 90% distal circumflex lesion with posterior leaflet tethering.  Also thought to be functional with atrial fibrillation. ? ?Continue to monitor, if it progresses would potentially consider MitraClip. ?

## 2021-09-14 NOTE — Assessment & Plan Note (Signed)
Lipids as of December were borderline control with an LDL of 73.  She is now also on Zetia along with her rosuvastatin.  With weight loss, dietary changes and trying to increase exercise, hopefully we can get back down below 70 if not closer to 55 LDL.  Would be reluctant to add additional medication beyond her current dosage-she was converted from atorvastatin to rosuvastatin already. ? ?Continuing on Farxiga plus Toujeo insulin for diabetes. ?

## 2021-09-15 NOTE — Telephone Encounter (Signed)
Spoke to patient's husband.  Instruction given per dr Ellyn Hack  ? - hold Losartan 12.5 mg  for 2 days  then restart taking medication. ?  Husband states blood pressure  has come up since last conversation this morning blood pressure was 109/83 . ? RN informed husband to follow Dr Ellyn Hack instruction. ? He verbalized understanding. ? ?Husband wanted to know when it would be the best time for patient to have labs drawn. ? Rn informed husband  before she sees Dr Aundra Dubin and has her echo . They cann also time lab work for when they need labs for her kidney doctor. ? Husband states he will tomorrow to see if any blood work is needed prior to appointment with kidney doctor which is before Dr 07/10/21. ?

## 2021-09-15 NOTE — Telephone Encounter (Signed)
Just have her hold it for couple days until her blood sugar is stabilized. ?

## 2021-09-18 ENCOUNTER — Encounter (HOSPITAL_COMMUNITY): Payer: Self-pay | Admitting: Emergency Medicine

## 2021-09-18 ENCOUNTER — Observation Stay (HOSPITAL_COMMUNITY): Payer: Medicare Other

## 2021-09-18 ENCOUNTER — Other Ambulatory Visit: Payer: Self-pay

## 2021-09-18 ENCOUNTER — Inpatient Hospital Stay (HOSPITAL_COMMUNITY)
Admission: EM | Admit: 2021-09-18 | Discharge: 2021-09-26 | DRG: 629 | Disposition: A | Discharge status: Home Health | Payer: Medicare Other | Attending: Internal Medicine | Admitting: Internal Medicine

## 2021-09-18 DIAGNOSIS — G4733 Obstructive sleep apnea (adult) (pediatric): Secondary | ICD-10-CM | POA: Diagnosis present

## 2021-09-18 DIAGNOSIS — E269 Hyperaldosteronism, unspecified: Secondary | ICD-10-CM | POA: Diagnosis present

## 2021-09-18 DIAGNOSIS — Z9013 Acquired absence of bilateral breasts and nipples: Secondary | ICD-10-CM

## 2021-09-18 DIAGNOSIS — D259 Leiomyoma of uterus, unspecified: Secondary | ICD-10-CM | POA: Diagnosis present

## 2021-09-18 DIAGNOSIS — M542 Cervicalgia: Secondary | ICD-10-CM | POA: Diagnosis present

## 2021-09-18 DIAGNOSIS — I420 Dilated cardiomyopathy: Secondary | ICD-10-CM | POA: Diagnosis present

## 2021-09-18 DIAGNOSIS — Z853 Personal history of malignant neoplasm of breast: Secondary | ICD-10-CM

## 2021-09-18 DIAGNOSIS — K648 Other hemorrhoids: Secondary | ICD-10-CM | POA: Diagnosis present

## 2021-09-18 DIAGNOSIS — Z923 Personal history of irradiation: Secondary | ICD-10-CM

## 2021-09-18 DIAGNOSIS — R509 Fever, unspecified: Secondary | ICD-10-CM | POA: Diagnosis not present

## 2021-09-18 DIAGNOSIS — K219 Gastro-esophageal reflux disease without esophagitis: Secondary | ICD-10-CM | POA: Diagnosis present

## 2021-09-18 DIAGNOSIS — N179 Acute kidney failure, unspecified: Secondary | ICD-10-CM | POA: Diagnosis present

## 2021-09-18 DIAGNOSIS — I700 Atherosclerosis of aorta: Secondary | ICD-10-CM | POA: Diagnosis present

## 2021-09-18 DIAGNOSIS — I5043 Acute on chronic combined systolic (congestive) and diastolic (congestive) heart failure: Secondary | ICD-10-CM

## 2021-09-18 DIAGNOSIS — I5022 Chronic systolic (congestive) heart failure: Secondary | ICD-10-CM | POA: Diagnosis present

## 2021-09-18 DIAGNOSIS — M6282 Rhabdomyolysis: Secondary | ICD-10-CM | POA: Diagnosis present

## 2021-09-18 DIAGNOSIS — N1832 Chronic kidney disease, stage 3b: Secondary | ICD-10-CM | POA: Diagnosis present

## 2021-09-18 DIAGNOSIS — I2510 Atherosclerotic heart disease of native coronary artery without angina pectoris: Secondary | ICD-10-CM | POA: Diagnosis present

## 2021-09-18 DIAGNOSIS — E876 Hypokalemia: Principal | ICD-10-CM | POA: Diagnosis present

## 2021-09-18 DIAGNOSIS — J45909 Unspecified asthma, uncomplicated: Secondary | ICD-10-CM | POA: Diagnosis present

## 2021-09-18 DIAGNOSIS — C7951 Secondary malignant neoplasm of bone: Secondary | ICD-10-CM | POA: Diagnosis present

## 2021-09-18 DIAGNOSIS — D638 Anemia in other chronic diseases classified elsewhere: Secondary | ICD-10-CM | POA: Diagnosis present

## 2021-09-18 DIAGNOSIS — K5900 Constipation, unspecified: Secondary | ICD-10-CM | POA: Diagnosis present

## 2021-09-18 DIAGNOSIS — C7952 Secondary malignant neoplasm of bone marrow: Secondary | ICD-10-CM | POA: Diagnosis present

## 2021-09-18 DIAGNOSIS — Z96641 Presence of right artificial hip joint: Secondary | ICD-10-CM | POA: Diagnosis present

## 2021-09-18 DIAGNOSIS — Z955 Presence of coronary angioplasty implant and graft: Secondary | ICD-10-CM

## 2021-09-18 DIAGNOSIS — R821 Myoglobinuria: Secondary | ICD-10-CM | POA: Diagnosis present

## 2021-09-18 DIAGNOSIS — I4821 Permanent atrial fibrillation: Secondary | ICD-10-CM | POA: Diagnosis present

## 2021-09-18 DIAGNOSIS — G4700 Insomnia, unspecified: Secondary | ICD-10-CM | POA: Diagnosis present

## 2021-09-18 DIAGNOSIS — Z87891 Personal history of nicotine dependence: Secondary | ICD-10-CM

## 2021-09-18 DIAGNOSIS — E785 Hyperlipidemia, unspecified: Secondary | ICD-10-CM | POA: Diagnosis present

## 2021-09-18 DIAGNOSIS — I130 Hypertensive heart and chronic kidney disease with heart failure and stage 1 through stage 4 chronic kidney disease, or unspecified chronic kidney disease: Secondary | ICD-10-CM | POA: Diagnosis present

## 2021-09-18 DIAGNOSIS — Z79899 Other long term (current) drug therapy: Secondary | ICD-10-CM

## 2021-09-18 DIAGNOSIS — M1A9XX0 Chronic gout, unspecified, without tophus (tophi): Secondary | ICD-10-CM | POA: Diagnosis present

## 2021-09-18 DIAGNOSIS — E1122 Type 2 diabetes mellitus with diabetic chronic kidney disease: Secondary | ICD-10-CM | POA: Diagnosis present

## 2021-09-18 DIAGNOSIS — F419 Anxiety disorder, unspecified: Secondary | ICD-10-CM | POA: Diagnosis present

## 2021-09-18 DIAGNOSIS — Z8249 Family history of ischemic heart disease and other diseases of the circulatory system: Secondary | ICD-10-CM

## 2021-09-18 DIAGNOSIS — Z7901 Long term (current) use of anticoagulants: Secondary | ICD-10-CM

## 2021-09-18 LAB — BASIC METABOLIC PANEL
Anion gap: 13 (ref 5–15)
BUN: 34 mg/dL — ABNORMAL HIGH (ref 8–23)
CO2: 34 mmol/L — ABNORMAL HIGH (ref 22–32)
Calcium: 10.4 mg/dL — ABNORMAL HIGH (ref 8.9–10.3)
Chloride: 91 mmol/L — ABNORMAL LOW (ref 98–111)
Creatinine, Ser: 2.87 mg/dL — ABNORMAL HIGH (ref 0.44–1.00)
GFR, Estimated: 17 mL/min — ABNORMAL LOW (ref >60)
Glucose, Bld: 94 mg/dL (ref 70–99)
Potassium: 2.4 mmol/L — CL (ref 3.5–5.1)
Sodium: 138 mmol/L (ref 135–145)

## 2021-09-18 LAB — URINALYSIS, ROUTINE W REFLEX MICROSCOPIC
Bilirubin Urine: NEGATIVE
Glucose, UA: >=500 mg/dL — AB
Ketones, ur: NEGATIVE mg/dL
Leukocytes,Ua: NEGATIVE
Nitrite: NEGATIVE
Protein, ur: 100 mg/dL — AB
Specific Gravity, Urine: 1.01 (ref 1.005–1.030)
pH: 5 (ref 5.0–8.0)

## 2021-09-18 LAB — CBC
HCT: 33.2 % — ABNORMAL LOW (ref 36.0–46.0)
Hemoglobin: 10.4 g/dL — ABNORMAL LOW (ref 12.0–15.0)
MCH: 28.4 pg (ref 26.0–34.0)
MCHC: 31.3 g/dL (ref 30.0–36.0)
MCV: 90.7 fL (ref 80.0–100.0)
Platelets: 309 10*3/uL (ref 150–400)
RBC: 3.66 MIL/uL — ABNORMAL LOW (ref 3.87–5.11)
RDW: 16 % — ABNORMAL HIGH (ref 11.5–15.5)
WBC: 10.4 10*3/uL (ref 4.0–10.5)
nRBC: 0 % (ref 0.0–0.2)

## 2021-09-18 LAB — COMPREHENSIVE METABOLIC PANEL
ALT: 125 U/L — ABNORMAL HIGH (ref 0–44)
AST: 221 U/L — ABNORMAL HIGH (ref 15–41)
Albumin: 2.9 g/dL — ABNORMAL LOW (ref 3.5–5.0)
Alkaline Phosphatase: 83 U/L (ref 38–126)
Anion gap: 16 — ABNORMAL HIGH (ref 5–15)
BUN: 34 mg/dL — ABNORMAL HIGH (ref 8–23)
CO2: 32 mmol/L (ref 22–32)
Calcium: 10.2 mg/dL (ref 8.9–10.3)
Chloride: 88 mmol/L — ABNORMAL LOW (ref 98–111)
Creatinine, Ser: 2.94 mg/dL — ABNORMAL HIGH (ref 0.44–1.00)
GFR, Estimated: 16 mL/min — ABNORMAL LOW (ref >60)
Glucose, Bld: 149 mg/dL — ABNORMAL HIGH (ref 70–99)
Potassium: 2 mmol/L — CL (ref 3.5–5.1)
Sodium: 136 mmol/L (ref 135–145)
Total Bilirubin: 0.9 mg/dL (ref 0.3–1.2)
Total Protein: 7.5 g/dL (ref 6.5–8.1)

## 2021-09-18 LAB — PROTIME-INR
INR: 1.9 — ABNORMAL HIGH (ref 0.8–1.2)
Prothrombin Time: 21.3 seconds — ABNORMAL HIGH (ref 11.4–15.2)

## 2021-09-18 LAB — ACETAMINOPHEN LEVEL: Acetaminophen (Tylenol), Serum: <10 ug/mL — ABNORMAL LOW (ref 10–30)

## 2021-09-18 LAB — HEPATITIS PANEL, ACUTE
HCV Ab: NONREACTIVE
Hep A IgM: NONREACTIVE
Hep B C IgM: NONREACTIVE
Hepatitis B Surface Ag: NONREACTIVE

## 2021-09-18 LAB — MAGNESIUM: Magnesium: 1.9 mg/dL (ref 1.7–2.4)

## 2021-09-18 LAB — CBG MONITORING, ED: Glucose-Capillary: 87 mg/dL (ref 70–99)

## 2021-09-18 LAB — ETHANOL: Alcohol, Ethyl (B): <10 mg/dL (ref <10)

## 2021-09-18 LAB — CK: Total CK: 4412 U/L — ABNORMAL HIGH (ref 38–234)

## 2021-09-18 LAB — POTASSIUM: Potassium: <2 mmol/L — CL (ref 3.5–5.1)

## 2021-09-18 LAB — TSH: TSH: 6.166 u[IU]/mL — ABNORMAL HIGH (ref 0.350–4.500)

## 2021-09-18 LAB — LIPASE, BLOOD: Lipase: 37 U/L (ref 11–51)

## 2021-09-18 MED ORDER — POTASSIUM CHLORIDE CRYS ER 20 MEQ PO TBCR
40.0000 meq | EXTENDED_RELEASE_TABLET | Freq: Once | ORAL | Status: AC
Start: 2021-09-18 — End: 2021-09-18
  Administered 2021-09-18: 40 meq via ORAL
  Filled 2021-09-18: qty 2

## 2021-09-18 MED ORDER — LACTATED RINGERS IV BOLUS
500.0000 mL | Freq: Once | INTRAVENOUS | Status: AC
  Administered 2021-09-18: 500 mL via INTRAVENOUS

## 2021-09-18 MED ORDER — ONDANSETRON HCL 4 MG PO TABS: 4.0000 mg | ORAL_TABLET | Freq: Four times a day (QID) | ORAL | Status: DC | PRN

## 2021-09-18 MED ORDER — APIXABAN 5 MG PO TABS
5.0000 mg | ORAL_TABLET | Freq: Two times a day (BID) | ORAL | Status: DC
  Administered 2021-09-18 – 2021-09-22 (×9): 5 mg via ORAL
  Filled 2021-09-18 (×10): qty 1

## 2021-09-18 MED ORDER — PANTOPRAZOLE SODIUM 40 MG PO TBEC
40.0000 mg | DELAYED_RELEASE_TABLET | Freq: Every day | ORAL | Status: DC
  Administered 2021-09-18 – 2021-09-26 (×8): 40 mg via ORAL
  Filled 2021-09-18 (×8): qty 1

## 2021-09-18 MED ORDER — EZETIMIBE 10 MG PO TABS
10.0000 mg | ORAL_TABLET | Freq: Every day | ORAL | Status: DC
Start: 2021-09-19 — End: 2021-09-26
  Administered 2021-09-19 – 2021-09-26 (×7): 10 mg via ORAL
  Filled 2021-09-18 (×7): qty 1

## 2021-09-18 MED ORDER — POTASSIUM CHLORIDE 10 MEQ/100ML IV SOLN
10.0000 meq | INTRAVENOUS | Status: AC
  Administered 2021-09-18 (×2): 10 meq via INTRAVENOUS
  Filled 2021-09-18 (×2): qty 100

## 2021-09-18 MED ORDER — ACETAMINOPHEN 650 MG RE SUPP: 650.0000 mg | Freq: Four times a day (QID) | RECTAL | Status: DC | PRN

## 2021-09-18 MED ORDER — INSULIN GLARGINE-YFGN 100 UNIT/ML ~~LOC~~ SOLN
30.0000 [IU] | Freq: Two times a day (BID) | SUBCUTANEOUS | Status: DC
  Administered 2021-09-19 – 2021-09-26 (×15): 30 [IU] via SUBCUTANEOUS
  Filled 2021-09-18 (×17): qty 0.3

## 2021-09-18 MED ORDER — ALUM & MAG HYDROXIDE-SIMETH 200-200-20 MG/5ML PO SUSP: 15.0000 mL | ORAL | Status: DC | PRN

## 2021-09-18 MED ORDER — POTASSIUM CHLORIDE CRYS ER 20 MEQ PO TBCR
60.0000 meq | EXTENDED_RELEASE_TABLET | Freq: Once | ORAL | Status: AC
  Administered 2021-09-18: 60 meq via ORAL
  Filled 2021-09-18: qty 3

## 2021-09-18 MED ORDER — ALBUTEROL SULFATE HFA 108 (90 BASE) MCG/ACT IN AERS: 1.0000 | INHALATION_SPRAY | Freq: Four times a day (QID) | RESPIRATORY_TRACT | Status: DC | PRN

## 2021-09-18 MED ORDER — ACETAMINOPHEN 325 MG PO TABS
650.0000 mg | ORAL_TABLET | Freq: Four times a day (QID) | ORAL | Status: DC | PRN
  Administered 2021-09-19 – 2021-09-24 (×7): 650 mg via ORAL
  Filled 2021-09-18 (×8): qty 2

## 2021-09-18 MED ORDER — INSULIN GLARGINE (1 UNIT DIAL) 300 UNIT/ML ~~LOC~~ SOPN: 30.0000 [IU] | PEN_INJECTOR | Freq: Two times a day (BID) | SUBCUTANEOUS | Status: DC

## 2021-09-18 MED ORDER — ALBUTEROL SULFATE (2.5 MG/3ML) 0.083% IN NEBU: 2.5000 mg | INHALATION_SOLUTION | RESPIRATORY_TRACT | Status: DC | PRN

## 2021-09-18 MED ORDER — PANTOPRAZOLE SODIUM 40 MG PO TBEC: 40.0000 mg | DELAYED_RELEASE_TABLET | Freq: Every day | ORAL | Status: DC

## 2021-09-18 MED ORDER — SENNOSIDES-DOCUSATE SODIUM 8.6-50 MG PO TABS: 1.0000 | ORAL_TABLET | Freq: Every evening | ORAL | Status: DC | PRN

## 2021-09-18 MED ORDER — ISOSORBIDE MONONITRATE ER 60 MG PO TB24
60.0000 mg | ORAL_TABLET | Freq: Every day | ORAL | Status: DC
Start: 2021-09-19 — End: 2021-09-26
  Administered 2021-09-19 – 2021-09-26 (×7): 60 mg via ORAL
  Filled 2021-09-18 (×7): qty 1
  Filled 2021-09-18: qty 2

## 2021-09-18 MED ORDER — INSULIN ASPART 100 UNIT/ML IJ SOLN
0.0000 [IU] | Freq: Three times a day (TID) | INTRAMUSCULAR | Status: DC
  Administered 2021-09-19 – 2021-09-22 (×5): 2 [IU] via SUBCUTANEOUS
  Administered 2021-09-22: 5 [IU] via SUBCUTANEOUS
  Administered 2021-09-22: 2 [IU] via SUBCUTANEOUS
  Administered 2021-09-23: 3 [IU] via SUBCUTANEOUS
  Administered 2021-09-24: 2 [IU] via SUBCUTANEOUS
  Administered 2021-09-24: 3 [IU] via SUBCUTANEOUS
  Administered 2021-09-25: 2 [IU] via SUBCUTANEOUS
  Administered 2021-09-25: 3 [IU] via SUBCUTANEOUS
  Administered 2021-09-26: 2 [IU] via SUBCUTANEOUS

## 2021-09-18 MED ORDER — POTASSIUM CHLORIDE CRYS ER 20 MEQ PO TBCR: 40.0000 meq | EXTENDED_RELEASE_TABLET | Freq: Once | ORAL | Status: DC

## 2021-09-18 MED ORDER — ONDANSETRON HCL 4 MG/2ML IJ SOLN: 4.0000 mg | Freq: Four times a day (QID) | INTRAMUSCULAR | Status: DC | PRN

## 2021-09-18 MED ORDER — POTASSIUM CHLORIDE 10 MEQ/100ML IV SOLN
10.0000 meq | INTRAVENOUS | Status: AC
  Administered 2021-09-18 – 2021-09-19 (×2): 10 meq via INTRAVENOUS
  Filled 2021-09-18 (×2): qty 100

## 2021-09-18 MED ORDER — MAGNESIUM SULFATE 2 GM/50ML IV SOLN
2.0000 g | Freq: Once | INTRAVENOUS | Status: AC
  Administered 2021-09-18: 2 g via INTRAVENOUS
  Filled 2021-09-18: qty 50

## 2021-09-18 NOTE — ED Provider Notes (Signed)
?[place] ?Provider Note ? ? ?CSN: [id number] ?Arrival date & time: 09/18/21  1324 ? ?  ? ?History ? ?Chief Complaint  ?Patient presents with  ? Abdominal Pain  ? ? ?Stephanie Franco is a 71 y.o. female. ? ? ?Abdominal Pain ? ?Patient is 71 year old female with a history of A-fib, type 2 diabetes, hypertension, history of breast cancer status post mastectomy, CAD with prior PCI who presents to the emergency department due to concern for UTI.  She reports she woke up today and her urine was strong smelling urine very concentrated.  She reported her urine has been decreasing in frequency however when she goes she does have a lot that she produces.  She reports she is on torsemide and her medication was doubled a month ago by her cardiology.  Denies any recent emesis or diarrhea.  Denies any fever.  Denies any increased vaginal discharge.  She denied abdominal pain for me.  Denies chest pain or shortness of breath.  She reports being compliant with her medication.  Otherwise no other complaints. ? ? ?Home Medications ?Prior to Admission medications   ?Medication Sig Start Date End Date Taking? Authorizing Provider  ?acetaminophen (TYLENOL) 500 MG tablet Take 1,000 mg by mouth every 6 (six) hours as needed for mild pain.   Yes [provider]  ?albuterol (PROVENTIL HFA;VENTOLIN HFA) 108 (90 Base) MCG/ACT inhaler Inhale 1-2 puffs into the lungs every 6 (six) hours as needed for wheezing or shortness of breath. 05/20/15  Yes Harvel Quale, MD  ?albuterol (PROVENTIL) (2.5 MG/3ML) 0.083% nebulizer solution Take 3 mLs (2.5 mg total) by nebulization every 6 (six) hours as needed for wheezing or shortness of breath. 05/20/15  Yes Harvel Quale, MD  ?allopurinol (ZYLOPRIM) 300 MG tablet Take 300 mg by mouth daily.   Yes [provider]  ?dapagliflozin propanediol (FARXIGA) 10 MG TABS tablet Take 1 tablet (10 mg total) by mouth daily. 06/28/21  Yes Simmons, Brittainy M,  PA-C  ?ELIQUIS 5 MG TABS tablet TAKE 1 TABLET BY MOUTH TWICE A DAY ?Patient taking differently: Take 5 mg by mouth 2 (two) times daily. 07/11/19  Yes Leonie Man, MD  ?ezetimibe (ZETIA) 10 MG tablet Take 1 tablet (10 mg total) by mouth daily. 06/27/21  Yes Lyda Jester M, PA-C  ?fexofenadine (ALLEGRA) 180 MG tablet Take 180 mg by mouth daily as needed for allergies.  04/30/15  Yes [provider]  ?fluticasone (FLONASE) 50 MCG/ACT nasal spray Place 2 sprays into the nose daily as needed for allergies or rhinitis.    Yes [provider]  ?gabapentin (NEURONTIN) 600 MG tablet Take 300 mg by mouth daily.   Yes [provider]  ?isosorbide mononitrate (IMDUR) 60 MG 24 hr tablet Take 1 tablet (60 mg total) by mouth daily. 07/04/21 09/18/21 Yes Milford, Maricela Bo, FNP  ?LORazepam (ATIVAN) 1 MG tablet Take 1 mg by mouth 2 (two) times daily. 07/07/16  Yes [provider]  ?losartan (COZAAR) 25 MG tablet Take 1/2 tablet (12.5 mg total) by mouth daily. 06/28/21  Yes Consuelo Pandy, PA-C  ?Multiple Vitamins-Minerals (MULTIVITAMIN WITH MINERALS) tablet Take 1 tablet by mouth daily.   Yes [provider]  ?Naphazoline-Glycerin (CLEAR EYES MAX REDNESS RELIEF OP) Place 2 drops into both eyes daily as needed (redness).   Yes [provider]  ?nitroGLYCERIN (NITROSTAT) 0.4 MG SL tablet DISSOLVE 1 TABLET UNDER THE TONGUE EVERY 5 MINUTES AS NEEDED FOR CHEST PAIN. ?  Patient taking differently: Place 0.4 mg under the tongue every 5 (five) minutes as needed for chest pain. 07/25/21  Yes Leonie Man, MD  ?OVER THE COUNTER MEDICATION Apply 1 application. topically daily as needed (pain). Apply to legs and feet   Yes [provider]  ?Oxycodone HCl 10 MG TABS Take 5 mg by mouth 3 (three) times daily as needed for pain. 07/26/21  Yes [provider]  ?pantoprazole (PROTONIX) 40 MG tablet TAKE 1 TABLET BY MOUTH EVERY DAY ?Patient taking differently: Take 40 mg  by mouth daily. 04/02/21  Yes Leonie Man, MD  ?rosuvastatin (CRESTOR) 40 MG tablet Take 1 tablet (40 mg total) by mouth daily. 05/14/21 09/18/21 Yes Leonie Man, MD  ?torsemide (DEMADEX) 20 MG tablet Take 20 mg by mouth 2 (two) times daily.   Yes [provider]  ?TOUJEO SOLOSTAR 300 UNIT/ML Solostar Pen Inject 30 Units into the skin 2 (two) times daily. 07/28/21  Yes [provider]  ?triamcinolone ointment (KENALOG) 0.5 % Apply to legs 2 times daily for 2 weeks ?Patient taking differently: Apply 1 application. topically 2 (two) times daily as needed (Irritation). 05/02/18  Yes Leonie Man, MD  ?glucose blood test strip 1 each by Other route as needed for other. Use as instructed    [provider]  ?   ? ?Allergies    ?Patient has no known allergies.   ? ?Review of Systems   ?Review of Systems  ?Gastrointestinal:  Positive for abdominal pain.  ? ?Physical Exam ?Updated Vital Signs ?BP 137/73   Pulse 80   Temp 99.3 ?F (37.4 ?C) (Oral)   Resp 12   SpO2 98%  ?Physical Exam ?Vitals and nursing note reviewed.  ?Constitutional:   ?   General: She is not in acute distress. ?   Appearance: She is ill-appearing.  ?HENT:  ?   Head: Normocephalic and atraumatic.  ?Eyes:  ?   Conjunctiva/sclera: Conjunctivae normal.  ?Cardiovascular:  ?   Rate and Rhythm: Normal rate and regular rhythm.  ?   Heart sounds: No murmur heard. ?Pulmonary:  ?   Effort: Pulmonary effort is normal. No respiratory distress.  ?   Breath sounds: Normal breath sounds.  ?Abdominal:  ?   Palpations: Abdomen is soft.  ?   Tenderness: There is no abdominal tenderness. There is no guarding or rebound.  ?Musculoskeletal:     ?   General: No swelling.  ?   Cervical back: Neck supple.  ?Skin: ?   General: Skin is warm and dry.  ?   Capillary Refill: Capillary refill takes less than 2 seconds.  ?Neurological:  ?   Mental Status: She is alert.  ?Psychiatric:     ?   Mood and Affect: Mood normal.  ? ? ?ED Results /  Procedures / Treatments   ?Labs ?(all labs ordered are listed, but only abnormal results are displayed) ?Labs Reviewed  ?COMPREHENSIVE METABOLIC PANEL - Abnormal; Notable for the following components:  ?    Result Value  ? Potassium 2.0 (*)   ? Chloride 88 (*)   ? Glucose, Bld 149 (*)   ? BUN 34 (*)   ? Creatinine, Ser 2.94 (*)   ? Albumin 2.9 (*)   ? AST 221 (*)   ? ALT 125 (*)   ? GFR, Estimated 16 (*)   ? Anion gap 16 (*)   ? All other components within normal limits  ?CBC - Abnormal; Notable for  the following components:  ? RBC 3.66 (*)   ? Hemoglobin 10.4 (*)   ? HCT 33.2 (*)   ? RDW 16.0 (*)   ? All other components within normal limits  ?POTASSIUM - Abnormal; Notable for the following components:  ? Potassium <2.0 (*)   ? All other components within normal limits  ?LIPASE, BLOOD  ?MAGNESIUM  ?URINALYSIS, ROUTINE W REFLEX MICROSCOPIC  ?TSH  ?CK  ?HEPATITIS PANEL, ACUTE  ?ACETAMINOPHEN LEVEL  ?PROTIME-INR  ?BASIC METABOLIC PANEL  ?BASIC METABOLIC PANEL  ?CBC  ?MAGNESIUM  ?PHOSPHORUS  ? ? ?EKG ?None ? ?Radiology ?No results found. ? ?Procedures ?Procedures  ? ?Medications Ordered in ED ?Medications  ?potassium chloride 10 mEq in 100 mL IVPB (10 mEq Intravenous New Bag/Given 09/18/21 1808)  ?acetaminophen (TYLENOL) tablet 650 mg (has no administration in time range)  ?  Or  ?acetaminophen (TYLENOL) suppository 650 mg (has no administration in time range)  ?senna-docusate (Senokot-S) tablet 1 tablet (has no administration in time range)  ?ondansetron (ZOFRAN) tablet 4 mg (has no administration in time range)  ?  Or  ?ondansetron (ZOFRAN) injection 4 mg (has no administration in time range)  ?lactated ringers bolus 500 mL (has no administration in time range)  ?insulin aspart (novoLOG) injection 0-15 Units (has no administration in time range)  ?albuterol (VENTOLIN HFA) 108 (90 Base) MCG/ACT inhaler 1-2 puff (has no administration in time range)  ?apixaban (ELIQUIS) tablet 5 mg (has no administration in time range)   ?ezetimibe (ZETIA) tablet 10 mg (has no administration in time range)  ?isosorbide mononitrate (IMDUR) 24 hr tablet 60 mg (has no administration in time range)  ?pantoprazole (PROTONIX) EC tablet 40 mg (has n

## 2021-09-18 NOTE — ED Triage Notes (Signed)
Patient complains of thinking she may have a urinary tract infection. Patient denies urinary frequency, complains of dark urine and groin pain. Denies pain with urination. ?

## 2021-09-18 NOTE — ED Provider Triage Note (Signed)
Emergency Medicine Provider Triage Evaluation Note ? ?Stephanie Franco , a 71 y.o. female  was evaluated in triage.  Pt complains of dark urine.  Patient noticed that her urine was a little bit dark yellow for 2 urination cycles and then she had clear urine this morning.  She is concerned that she might have an infection has a history of kidney issues.  She has no abdominal pain, nausea, vomiting diarrhea or fevers.  She denies dysuria or hematuria ? ?Review of Systems  ?Positive: Dark urine ?Negative: Abdominal pain ? ?Physical Exam  ?BP (!) 172/78 (BP Location: Left Arm)   Pulse (!) 55   Temp 99.3 ?F (37.4 ?C) (Oral)   Resp 18   SpO2 98%  ?Gen:   Awake, no distress   ?Resp:  Normal effort  ?MSK:   Moves extremities without difficulty  ?Other:  No abdominal tenderness ? ?Medical Decision Making  ?Medically screening exam initiated at 2:03 PM.  Appropriate orders placed.  Stephanie Franco was informed that the remainder of the evaluation will be completed by another provider, this initial triage assessment does not replace that evaluation, and the importance of remaining in the ED until their evaluation is complete. ? ?Work-up initiated ?  ?Margarita Mail, PA-C ?09/18/21 1420 ? ?

## 2021-09-18 NOTE — Hospital Course (Addendum)
Severe hypokalemia induced rhabdomyolysis ?AKI on CKD 3b ?Patient initially presented for dark urine and generalized weakness and was found to have hypokalemia, down to 2. CK levels were also elevated at over 4000. She was given gentle fluids in the setting of her heart failure and her potassium was repleted during her hospital admission. Etiology of her potassium was unclear but thought to be related to her torsemide. Held nephrotoxic agents as well due to her AKI. Concern for hyperaldosteronism with renin/aldosterone levels still pending at time of discharge; started 25 mg daily of spironolactone. Potassium levels improved throughout hospital course. Renal function returned to baseline. She was slowly restarted on her home dose of Torsemide and given potassium supplement. Potassium levels were stable at time of discharge.  ? 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 Incidental finding of osseous metastases. She has history of breast cancer s/p left breast mastectomy in 2008. Oncology consulted and bone scan revealed scattered osseous metastases including distal femoral diaphyses bilaterally. CT guided biopsy was performed as well during this admission which she tolerated well. She did develop episodes of cyclic fever during this admission, occurring at night. No leukocytosis and CXR did not show acute pulmonary process. Initial blood cx were also negative. 5/10 blood cultures with NGTD, please follow up. Episodes of fever likely from patient's metastatic disease. S/p IR guided bony met biopsy 09/25/21 -- follow up with oncology on 10/02/21 at 1:15 am. Corrected calcium of 13; suspect this is from bony mets from underlying metastatic malignancy, and plan for bisphosphonate therapy as an OP.  ?Patient  to see oncology outpatient at the [facility] for further treatment on 10/02/21 at 1:15 PM; please ensure follow up.  ? ?Chronic HFrEF  ?She has history of HFrEF with EF down to 35% in 06/2021.  Her Wilder Glade were held during her admission due to her severe hypokalemia and AKI and was given gentle fluids. Torsemide was initially held, but then started back on home dose after developing some SHOB. She did develop some shortness of breath with ambulation requiring O2 nasal cannula. She was restarted on her Torsemide 20 mg BID for diuresis as well as potassium 40 mEq BID. She was ambulated at time of discharge with O2 sat 95% on RA. Does not meet criteria for home oxygen. She as discharged with all of her home medication restarted and we recommend close follow-up with her cardiologist to optimize her medication regimen for her heart failure.  ? ?Anemia of chronic disease ?Hemoglobin slowly trended downward during her admission from 10.4 down to 7.9 despite providing with ferrous sulfate supplement. She does have history of anemia due to menorrhagia. Iron study showed elevated ferritin with low saturation. She was provided with IV iron to replenishment with improvements in her hemoglobin levels. She did not have any hematochezia, hemoptysis, or hematemesis during this admission. Suspect metastatic bone disease contributing to this. Follow up with a CBC at follow up.  ? ?Elevated thyroid function test ?Patient's thyroid function test was abnormal with elevated TSH of 6.1 and T4 level 4.2. She has history of elevated TSH levels however she was never diagnosed or treated for this. MRI was negative for pituitary adenoma. Thyroid ultrasound was unremarkable. Recommend repeat TSH at time of hospital follow up.  ? ?Transaminitis ?Hepatic function panel showed AST elevated  at 221 and ALT 125. Levels continued to improve throughout the hospital course. Transaminitis thought to be secondary to rhabdomyolysis induced by severe  hypokalemia. We held her Rosuvastatin during her admission. LFT's improved during hospitalization, and statin was resumed at time of DC. ? ?Permanent atrial fibrillation  ?Chronic and stable and remained anticoagulated on Eliquis. Eliquis was held for her bone biopsy and was restarted after 09/26/21.   ? ?HTN ?Blood pressure remained stable on Imdur during her hospital course.  ? ?T2DM ?Patient's Wilder Glade was held during her admission due to her hypokalemia and AKI. Blood glucose was under good control on Semglee. Wilder Glade was restarted at time of discharge.  ? ?HLD ?Patient's Rosuvastatin was held due to her transaminitis. She remained on her Zetia during this admission. Transaminitis improved throughout hospitalization. Rosuvastatin was restarted at time of discharge.  ? ?Gout ?Her allopurinol was held due to her AKI. No acute gout flare-ups during her hospital stay. Restarted allopurinol at time of discharge.  ? ?

## 2021-09-18 NOTE — Progress Notes (Incomplete)
? ?  Subjective: ?No acute overnight events.  ? ?Patient was seen at bedside during rounds today. Pt reports feeling ***. Pt complains of ***. Pt denies ***.  ? ?Pt is updated on the plan for today, and all questions and concerns are addressed.  ? ?Objective: ? ?Vital signs in last 24 hours: ?Vitals:  ? 09/18/21 1718 09/18/21 1815 09/18/21 2000 09/18/21 2200  ?BP: 128/65 137/73 129/75 (!) 118/46  ?Pulse: 76 80 88 87  ?Resp: '18 12 19 '$ (!) 23  ?Temp:      ?TempSrc:      ?SpO2: 100% 98% 96% 98%  ? ?*** ?Constitutional: alert, well-appearing, in NAD ?HENT: normocephalic, atraumatic, mucous membranes moist ?Eyes: conjunctiva non-erythematous, EOMI ?Neck: supple ?Cardiovascular: RRR, no m/r/g, non-edematous bilateral LE ?Pulmonary/Chest: normal work of breathing on room air, LCTAB ?Abdominal: soft, non-tender to palpation, non-distended ?MSK: normal bulk and tone ?Neurological: A&O x 3 ?Skin: warm and dry ?Psych: normal behavior, normal affect ? ? ?Assessment/Plan: ? ?Principal Problem: ?  AKI (acute kidney injury) ([place]) ? ?AKI on CKD 3b in the s/o severe hypokalemia induced rhabdomyolysis ?Initial labs with Cr 2.94 (baseline from 1.6-2.0), K 2.0, and Mag. UA with hemoglobinuria (RBC 0-5), proteinuria, glucosuria, and non-squamous epithelial cells. Renal US reassuring. Urine P/C ratio ***. CK ***. Given dark urine and weakness in the s/o of UA findings, suspect this is rhabdomyolysis induced by severe hypokalemia. K improved from 2 -> *** s/p *** mEq K. She has low K at baseline, however not this severe-- etiology remains unclear; the only K wasting medication she is on is torsemide 20 mg BID. Primary hyperaldosteronism should be considered in this case ***  ?-LR 500 cc bolus given  ?-*** mEq K ?-Avoid nephrotoxins  ?-Holding home Farxiga, Allopurinol, Losartan 12.5 QD, Torsemide 20 BID  ?-Follow up BMP  ?-Follow up CBC  ?  ?Transaminitis   ?Given significant rise in AST ALT to 221 and 125 (today) from 24 and 19 (1 mo ago)  with negative workup, including RUQ [place], acetaminophen level, and ethanol level, suspect this is likely transient in the s/o possible severe hypokalemia induced rhabdomyolysis. PT/INR *** ?-Follow up hepatitis panel  ?-Holding Rosuvastatin  ? ?Hypothyroidism ?TSH 6 ***  ?  ?HFrEF 35% (06/2021) ?Chronic and stable. Slightly dry on exam. Good med compliance.  ?-Holding home torsemide and Farxiga in setting of AKI  ?-Strict I/O's ?-Daily weights  ?  ?Permanent atrial fibrillation  ?Chronic and stable. Rates in 80's.  ?-Continue home Eliquid 5 mg BID  ?  ?HTN  ?Chronic and stable. BP 137/73 ?-Continue home Imdur  ?  ?T2DM ?06/24/21 A1c 7.5 ?-Home insulin glargine 30U BID resumed   ?-CBG monitoring and SSI ?  ?HLD ?LDL 73 in 04/2021. Good compliance to Rosuvastatin and Zetia.  ?-Holding Rosuvastatin as above  ?-Continue Zetia  ?  ?Gout ?Chronic and stable. Holding allopurinol in the setting of AKI on CKD 3b ? ? ?Best Practice: ?Diet: Carb-Modified ?IVF: LR,Bolus ?VTE: Eliquis  ?Code: Full ? ? ?Lajean Manes, MD  ?Internal Medicine Resident, PGY-1 ?Pager: [phone] ?After 5pm on weekdays and 1pm on weekends: On Call pager [phone] ?

## 2021-09-18 NOTE — ED Notes (Signed)
Hospitalist at bedside at this time 

## 2021-09-18 NOTE — ED Notes (Signed)
Return from US

## 2021-09-18 NOTE — ED Notes (Signed)
Patient moved into chair from stretcher at this time; call bell in reach  ?

## 2021-09-18 NOTE — Progress Notes (Signed)
Patient evaluated at bedside after CK resulted >4000. She had complaints of being hungry and uncomfortable in the bed, as well as heartburn. She denies difficulty breathing or muscle pains. She endorsed feeling agitated at the time. ? ?Physical exam showed warm and dry skin without extremity edema, lung fields clear. ? ?Will order protonix 40 mg and maalox 15 mL for heartburn. I also spoke with her nurse and requested that she get help up to the bedside recliner and ok'ed her to have some food. Will order additional 500 cc bolus IVF at this time and recheck CK with 0200 BMP. If no/minimal improvement of renal function and CK at that time, will add additional 500 cc bolus. ? ?Farrel Gordon, DO ?

## 2021-09-18 NOTE — H&P (Addendum)
? ?Date: 09/18/2021     ?     ?     ?Patient Name:  Stephanie Franco MRN: [id number]  ?DOB: 1951-04-12 Age / Sex: 71 y.o., female   ?PCP: Lin Landsman, MD    ?     ?Medical Service: Internal Medicine Teaching Service    ?     ?Attending Physician: Dr. Daryll Drown     ?First Contact: Lajean Manes, MD Pager: MP [phone]  ?Second Contact: Linwood Dibbles, MD Pager: PA [phone]  ?     ?After Hours (After 5p/  First Contact Pager: [phone]  ?weekends / holidays): Second Contact Pager: [phone]  ? ? ?Chief Complaint: "dark urine and generalized weakness" ? ?History of Present Illness:  ?Stephanie Franco is a 71 yo female with with past medical history of permanent atrial fibrillation on Eliquis, CAD with prior PCI, HFrEF 35% (06/2021), HTN, HLD, T2DM, breast cancer s/p mastectomy (2008), and class 3 obesity presenting to the ED for above.  ? ?She reports noticing dark urine, loss of appetite, and generalized weakness x 2 days. She came to the ED for concerns for UTI given dark urine. Denies dysuria. Denies fevers, chills, N/V/D, chest pain, SHOB, dark stools, bloody stools, or urinary symptoms. Denies any falls. She reports her cardiologist, Dr Ellyn Hack, recently told her to drink more water as her kidneys are dry based on a OV ~2 weeks ago. She reports drinking 3-4 bottles of water a day, but reports poor PO intake x several days, complaining of poor appetite and generalized weakness. No sick contacts. Recent medication change includes holding her losartan for the last 3 days at the instruction of her cardiologist for soft pressures. She reports good medication compliance.  ? ?Initial ED workup with Cr of 2.94 (baseline 1.6-2), K of 2.0, AST 221, ALT 125, alk phos 83, lipase 37, and anion gap 16. Mag 1.9. 80 mg of K and Mag repleted. EKG unremarkable. CBC stable and unremarkable. UA pending. No imaging was ordered in the ED.    ? ?Meds:  ?No current facility-administered medications on file prior to encounter.  ? ?Current Outpatient  Medications on File Prior to Encounter  ?Medication Sig Dispense Refill  ? acetaminophen (TYLENOL) 500 MG tablet Take 1,000 mg by mouth every 6 (six) hours as needed for mild pain.    ? albuterol (PROVENTIL HFA;VENTOLIN HFA) 108 (90 Base) MCG/ACT inhaler Inhale 1-2 puffs into the lungs every 6 (six) hours as needed for wheezing or shortness of breath. 1 Inhaler 0  ? albuterol (PROVENTIL) (2.5 MG/3ML) 0.083% nebulizer solution Take 3 mLs (2.5 mg total) by nebulization every 6 (six) hours as needed for wheezing or shortness of breath. 75 mL 12  ? allopurinol (ZYLOPRIM) 300 MG tablet Take 300 mg by mouth daily.    ? dapagliflozin propanediol (FARXIGA) 10 MG TABS tablet Take 1 tablet (10 mg total) by mouth daily. 30 tablet 5  ? ELIQUIS 5 MG TABS tablet TAKE 1 TABLET BY MOUTH TWICE A DAY (Patient taking differently: Take 5 mg by mouth 2 (two) times daily.) 180 tablet 2  ? ezetimibe (ZETIA) 10 MG tablet Take 1 tablet (10 mg total) by mouth daily. 30 tablet 5  ? fexofenadine (ALLEGRA) 180 MG tablet Take 180 mg by mouth daily as needed for allergies.   3  ? fluticasone (FLONASE) 50 MCG/ACT nasal spray Place 2 sprays into the nose daily as needed for allergies or rhinitis.     ? gabapentin (NEURONTIN) 600 MG tablet Take  300 mg by mouth daily.    ? isosorbide mononitrate (IMDUR) 60 MG 24 hr tablet Take 1 tablet (60 mg total) by mouth daily. 30 tablet 6  ? LORazepam (ATIVAN) 1 MG tablet Take 1 mg by mouth 2 (two) times daily.    ? losartan (COZAAR) 25 MG tablet Take 1/2 tablet (12.5 mg total) by mouth daily. 30 tablet 5  ? Multiple Vitamins-Minerals (MULTIVITAMIN WITH MINERALS) tablet Take 1 tablet by mouth daily.    ? Naphazoline-Glycerin (CLEAR EYES MAX REDNESS RELIEF OP) Place 2 drops into both eyes daily as needed (redness).    ? nitroGLYCERIN (NITROSTAT) 0.4 MG SL tablet DISSOLVE 1 TABLET UNDER THE TONGUE EVERY 5 MINUTES AS NEEDED FOR CHEST PAIN. (Patient taking differently: Place 0.4 mg under the tongue every 5 (five)  minutes as needed for chest pain.) 25 tablet 6  ? OVER THE COUNTER MEDICATION Apply 1 application. topically daily as needed (pain). Apply to legs and feet    ? Oxycodone HCl 10 MG TABS Take 5 mg by mouth 3 (three) times daily as needed for pain.    ? pantoprazole (PROTONIX) 40 MG tablet TAKE 1 TABLET BY MOUTH EVERY DAY (Patient taking differently: Take 40 mg by mouth daily.) 90 tablet 3  ? rosuvastatin (CRESTOR) 40 MG tablet Take 1 tablet (40 mg total) by mouth daily. 90 tablet 3  ? torsemide (DEMADEX) 20 MG tablet Take 20 mg by mouth 2 (two) times daily.    ? TOUJEO SOLOSTAR 300 UNIT/ML Solostar Pen Inject 30 Units into the skin 2 (two) times daily.    ? triamcinolone ointment (KENALOG) 0.5 % Apply to legs 2 times daily for 2 weeks (Patient taking differently: Apply 1 application. topically 2 (two) times daily as needed (Irritation).) 30 g 0  ? glucose blood test strip 1 each by Other route as needed for other. Use as instructed    ? ?Allergies: ?Allergies as of 09/18/2021  ? (No Known Allergies)  ? ?Past Medical History:  ?Diagnosis Date  ? Aortic atherosclerosis ([place])   ? Arthritis   ? Asthma   ? Atrial fibrillation, permanent ([place])   ? Rate control with Bystolic. CHA2DS2Vasc = 6 (HTN, DM, CHF, Age 20, Female) -> on Pradaxa  ? Breast cancer ([place]) 2008  ? S/P mastectomy  ? CAD S/P percutaneous coronary angioplasty 2006  ? PCI of circumflex with Taxus DES;; relook-cath Feb 2013: 50-60% short lesion in RCA, 40% ISR Circumflex stent.;  06/2021: Patent LCx stent.  Distal LCx 90% stenosis, proximal-mid RCA 60%.  ? CHF (congestive heart failure), NYHA class II, chronic, combined ([place])   ? CKD (chronic kidney disease), stage III ([place])   ? Complication of anesthesia   ? prolonged sedation after colonoscopy in [place]  ? Diabetes mellitus, uncontrolled 07/14/2011  ? 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 2019: EF down to 25% improved to 50 to 55% x 2020. => Recurrent cardiomyopathy 06/2021 => EF 25 to 30% on Echo, CMR showed  severe BiV failure (LVEF 36%, diffuse HK; RVEF 32%).  Severe biatrial enlargement.  Moderate MR posterior directed.  Suspect functional (suggested by TEE); noncoronary pattern for delayed gadolinium-suggest myocarditis, & prior Ant MI-subendocardial => Mixed cardiomyopathy  ? Edema of both legs   ? Usually mild, chronic. Controlled with when necessary furosemide and diet  ? 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 "have that now"  ? GERD (gastroesophageal reflux disease)   ? Gout   ? Hyperlipidemia   ? Hypertension   ?  Hypokalemia   ? Morbid obesity ([place])   ? BMI 41  ? OSA on CPAP   ? On CPAP  ? Pulmonary hypertension, unspecified ([place])   ? PAP ~90 mmHg on Echo 12/2017 -- has OSA on CPAP & obesity -> follow-up sleep study showed mild OSA.;  09/22/2021: RHC showed PAP of 86/30 mmHg with LVEDP of 14 mmHg.  ? Systolic murmur   ? ?Family History:  ?Family History  ?Problem Relation Age of Onset  ? Coronary artery disease Mother   ? Hypertension Mother   ? Heart disease Father   ? Atrial fibrillation Son   ? Lung cancer Sister   ? Heart disease Brother   ? Heart disease Brother   ? Healthy Son   ? Thyroid disease Daughter   ? Healthy Daughter   ? Healthy Daughter   ? ? ?Social History:  ?Lives with her husband here in [place] ?Tobacco- Denies use; previously smoked 5 cig/day about 40 years ago ?EtOH- Denies use, but did previously drink "cocktails" ?Illicit drug use- denies use  ?IADLs/ADLs- can person independently at baseline  ? ?Review of Systems: ?A complete ROS was negative except as per HPI.   ? ?Physical Exam: ?Blood pressure 137/73, pulse 80, temperature 99.3 ?F (37.4 ?C), temperature source Oral, resp. rate 12, SpO2 98 %. ?Constitutional: alert, well-appearing, in NAD  ?HENT: normocephalic, atraumatic, mucous membranes moist ?Eyes: conjunctiva non-erythematous, EOMI ?Neck: supple, no JVD ?Cardiovascular: irregularly irregular, no m/r/g, non-edematous bilateral LE ?Pulmonary/Chest: normal work of breathing on room air,  LCTAB ?Abdominal: soft, non-tender to palpation, non-distended ?MSK: normal bulk and tone ?Neurological: A&O x 3 ?Skin: warm and dry, slightly decreased skin turgor  ?Psych: normal behavior, normal affect ? ?EKG: at

## 2021-09-18 NOTE — ED Notes (Signed)
Patient transported to Ultrasound 

## 2021-09-19 ENCOUNTER — Inpatient Hospital Stay (HOSPITAL_COMMUNITY): Payer: Medicare Other

## 2021-09-19 DIAGNOSIS — J45909 Unspecified asthma, uncomplicated: Secondary | ICD-10-CM | POA: Diagnosis present

## 2021-09-19 DIAGNOSIS — E269 Hyperaldosteronism, unspecified: Secondary | ICD-10-CM | POA: Diagnosis present

## 2021-09-19 DIAGNOSIS — K5900 Constipation, unspecified: Secondary | ICD-10-CM | POA: Diagnosis present

## 2021-09-19 DIAGNOSIS — D638 Anemia in other chronic diseases classified elsewhere: Secondary | ICD-10-CM | POA: Diagnosis present

## 2021-09-19 DIAGNOSIS — E1122 Type 2 diabetes mellitus with diabetic chronic kidney disease: Secondary | ICD-10-CM | POA: Diagnosis present

## 2021-09-19 DIAGNOSIS — C7951 Secondary malignant neoplasm of bone: Secondary | ICD-10-CM | POA: Diagnosis present

## 2021-09-19 DIAGNOSIS — K219 Gastro-esophageal reflux disease without esophagitis: Secondary | ICD-10-CM | POA: Diagnosis present

## 2021-09-19 DIAGNOSIS — I2510 Atherosclerotic heart disease of native coronary artery without angina pectoris: Secondary | ICD-10-CM | POA: Diagnosis present

## 2021-09-19 DIAGNOSIS — C7952 Secondary malignant neoplasm of bone marrow: Secondary | ICD-10-CM | POA: Diagnosis present

## 2021-09-19 DIAGNOSIS — N179 Acute kidney failure, unspecified: Secondary | ICD-10-CM | POA: Diagnosis present

## 2021-09-19 DIAGNOSIS — I4821 Permanent atrial fibrillation: Secondary | ICD-10-CM | POA: Diagnosis present

## 2021-09-19 DIAGNOSIS — E785 Hyperlipidemia, unspecified: Secondary | ICD-10-CM | POA: Diagnosis present

## 2021-09-19 DIAGNOSIS — D649 Anemia, unspecified: Secondary | ICD-10-CM | POA: Diagnosis not present

## 2021-09-19 DIAGNOSIS — I700 Atherosclerosis of aorta: Secondary | ICD-10-CM | POA: Diagnosis present

## 2021-09-19 DIAGNOSIS — G4733 Obstructive sleep apnea (adult) (pediatric): Secondary | ICD-10-CM | POA: Diagnosis present

## 2021-09-19 DIAGNOSIS — N1832 Chronic kidney disease, stage 3b: Secondary | ICD-10-CM | POA: Diagnosis present

## 2021-09-19 DIAGNOSIS — E876 Hypokalemia: Secondary | ICD-10-CM | POA: Diagnosis present

## 2021-09-19 DIAGNOSIS — M6282 Rhabdomyolysis: Secondary | ICD-10-CM | POA: Diagnosis present

## 2021-09-19 DIAGNOSIS — I5022 Chronic systolic (congestive) heart failure: Secondary | ICD-10-CM | POA: Diagnosis present

## 2021-09-19 DIAGNOSIS — I420 Dilated cardiomyopathy: Secondary | ICD-10-CM | POA: Diagnosis present

## 2021-09-19 DIAGNOSIS — R509 Fever, unspecified: Secondary | ICD-10-CM | POA: Diagnosis not present

## 2021-09-19 DIAGNOSIS — C493 Malignant neoplasm of connective and soft tissue of thorax: Secondary | ICD-10-CM | POA: Diagnosis not present

## 2021-09-19 DIAGNOSIS — M542 Cervicalgia: Secondary | ICD-10-CM | POA: Diagnosis present

## 2021-09-19 DIAGNOSIS — M1A9XX0 Chronic gout, unspecified, without tophus (tophi): Secondary | ICD-10-CM | POA: Diagnosis present

## 2021-09-19 DIAGNOSIS — I130 Hypertensive heart and chronic kidney disease with heart failure and stage 1 through stage 4 chronic kidney disease, or unspecified chronic kidney disease: Secondary | ICD-10-CM | POA: Diagnosis present

## 2021-09-19 DIAGNOSIS — R821 Myoglobinuria: Secondary | ICD-10-CM | POA: Diagnosis present

## 2021-09-19 DIAGNOSIS — Z853 Personal history of malignant neoplasm of breast: Secondary | ICD-10-CM | POA: Diagnosis not present

## 2021-09-19 LAB — HEPATIC FUNCTION PANEL
ALT: 106 U/L — ABNORMAL HIGH (ref 0–44)
AST: 182 U/L — ABNORMAL HIGH (ref 15–41)
Albumin: 2.4 g/dL — ABNORMAL LOW (ref 3.5–5.0)
Alkaline Phosphatase: 77 U/L (ref 38–126)
Bilirubin, Direct: 0.3 mg/dL — ABNORMAL HIGH (ref 0.0–0.2)
Indirect Bilirubin: 0.6 mg/dL (ref 0.3–0.9)
Total Bilirubin: 0.9 mg/dL (ref 0.3–1.2)
Total Protein: 6.7 g/dL (ref 6.5–8.1)

## 2021-09-19 LAB — BASIC METABOLIC PANEL
Anion gap: 11 (ref 5–15)
Anion gap: 13 (ref 5–15)
Anion gap: 14 (ref 5–15)
BUN: 32 mg/dL — ABNORMAL HIGH (ref 8–23)
BUN: 30 mg/dL — ABNORMAL HIGH (ref 8–23)
BUN: 28 mg/dL — ABNORMAL HIGH (ref 8–23)
CO2: 31 mmol/L (ref 22–32)
CO2: 29 mmol/L (ref 22–32)
CO2: 29 mmol/L (ref 22–32)
Calcium: 9.7 mg/dL (ref 8.9–10.3)
Calcium: 10.3 mg/dL (ref 8.9–10.3)
Calcium: 10.5 mg/dL — ABNORMAL HIGH (ref 8.9–10.3)
Chloride: 97 mmol/L — ABNORMAL LOW (ref 98–111)
Chloride: 98 mmol/L (ref 98–111)
Chloride: 98 mmol/L (ref 98–111)
Creatinine, Ser: 2.6 mg/dL — ABNORMAL HIGH (ref 0.44–1.00)
Creatinine, Ser: 2.5 mg/dL — ABNORMAL HIGH (ref 0.44–1.00)
Creatinine, Ser: 2.47 mg/dL — ABNORMAL HIGH (ref 0.44–1.00)
GFR, Estimated: 19 mL/min — ABNORMAL LOW (ref >60)
GFR, Estimated: 20 mL/min — ABNORMAL LOW (ref >60)
GFR, Estimated: 20 mL/min — ABNORMAL LOW (ref >60)
Glucose, Bld: 99 mg/dL (ref 70–99)
Glucose, Bld: 94 mg/dL (ref 70–99)
Glucose, Bld: 115 mg/dL — ABNORMAL HIGH (ref 70–99)
Potassium: 3.5 mmol/L (ref 3.5–5.1)
Potassium: 2.7 mmol/L — CL (ref 3.5–5.1)
Potassium: 3.3 mmol/L — ABNORMAL LOW (ref 3.5–5.1)
Sodium: 139 mmol/L (ref 135–145)
Sodium: 140 mmol/L (ref 135–145)
Sodium: 141 mmol/L (ref 135–145)

## 2021-09-19 LAB — CBC
HCT: 29.7 % — ABNORMAL LOW (ref 36.0–46.0)
Hemoglobin: 9.1 g/dL — ABNORMAL LOW (ref 12.0–15.0)
MCH: 28.4 pg (ref 26.0–34.0)
MCHC: 30.6 g/dL (ref 30.0–36.0)
MCV: 92.8 fL (ref 80.0–100.0)
Platelets: 271 10*3/uL (ref 150–400)
RBC: 3.2 MIL/uL — ABNORMAL LOW (ref 3.87–5.11)
RDW: 16.3 % — ABNORMAL HIGH (ref 11.5–15.5)
WBC: 10 10*3/uL (ref 4.0–10.5)
nRBC: 0 % (ref 0.0–0.2)

## 2021-09-19 LAB — CORTISOL: Cortisol, Plasma: 15.3 ug/dL

## 2021-09-19 LAB — PROTEIN / CREATININE RATIO, URINE
Creatinine, Urine: 79.36 mg/dL
Protein Creatinine Ratio: 2.99 mg/mg{Cre} — ABNORMAL HIGH (ref 0.00–0.15)
Total Protein, Urine: 237 mg/dL

## 2021-09-19 LAB — CK: Total CK: 3845 U/L — ABNORMAL HIGH (ref 38–234)

## 2021-09-19 LAB — GLUCOSE, CAPILLARY
Glucose-Capillary: 85 mg/dL (ref 70–99)
Glucose-Capillary: 167 mg/dL — ABNORMAL HIGH (ref 70–99)

## 2021-09-19 LAB — T4, FREE: Free T4: 4.28 ng/dL — ABNORMAL HIGH (ref 0.61–1.12)

## 2021-09-19 LAB — CBG MONITORING, ED
Glucose-Capillary: 131 mg/dL — ABNORMAL HIGH (ref 70–99)
Glucose-Capillary: 100 mg/dL — ABNORMAL HIGH (ref 70–99)

## 2021-09-19 LAB — PHOSPHORUS: Phosphorus: 3.3 mg/dL (ref 2.5–4.6)

## 2021-09-19 LAB — MAGNESIUM: Magnesium: 2.2 mg/dL (ref 1.7–2.4)

## 2021-09-19 MED ORDER — LACTATED RINGERS IV SOLN: INTRAVENOUS | Status: AC

## 2021-09-19 MED ORDER — GABAPENTIN 300 MG PO CAPS
300.0000 mg | ORAL_CAPSULE | Freq: Every day | ORAL | Status: DC
  Administered 2021-09-19 – 2021-09-26 (×7): 300 mg via ORAL
  Filled 2021-09-19 (×7): qty 1

## 2021-09-19 MED ORDER — LACTATED RINGERS IV BOLUS
1000.0000 mL | Freq: Once | INTRAVENOUS | Status: AC
  Administered 2021-09-19: 1000 mL via INTRAVENOUS

## 2021-09-19 MED ORDER — NAPHAZOLINE-GLYCERIN 0.012-0.25 % OP SOLN
1.0000 [drp] | Freq: Every day | OPHTHALMIC | Status: DC | PRN
  Filled 2021-09-19: qty 15

## 2021-09-19 MED ORDER — LORAZEPAM 2 MG/ML IJ SOLN
1.0000 mg | INTRAMUSCULAR | Status: AC
  Administered 2021-09-19: 1 mg via INTRAVENOUS
  Filled 2021-09-19: qty 1

## 2021-09-19 MED ORDER — SPIRONOLACTONE 25 MG PO TABS
25.0000 mg | ORAL_TABLET | Freq: Every day | ORAL | Status: DC
  Administered 2021-09-19 – 2021-09-26 (×7): 25 mg via ORAL
  Filled 2021-09-19 (×8): qty 1

## 2021-09-19 MED ORDER — POTASSIUM CHLORIDE CRYS ER 20 MEQ PO TBCR
60.0000 meq | EXTENDED_RELEASE_TABLET | Freq: Two times a day (BID) | ORAL | Status: AC
  Administered 2021-09-19 (×2): 60 meq via ORAL
  Filled 2021-09-19 (×2): qty 3

## 2021-09-19 MED ORDER — NAPHAZOLINE-GLYCERIN 0.03-0.5 % OP SOLN: Freq: Every day | OPHTHALMIC | Status: DC | PRN

## 2021-09-19 NOTE — Progress Notes (Addendum)
? ?Subjective: ?Stephanie Franco is a 71 y.o. female with PMH significant for HFrEF with recent EF down to 35%, CKD stage 3, T2DM, permanent atrial fibrillation, CAD s/p angioplasty, HLD,  who was admitted after being noted to have severe hypokalemia induced rhabdomyolysis. No acute events overnight. During morning rounds, patient reports persistent weakness that has not worsened since yesterday. She denies any shortness of breath or difficulty breathing. We also discussed her elevated TSH and T4 levels noted on labs yesterday. She states she noted some weight loss over the past few months, approximately 2-5 pounds however no significant weight loss. She denies diarrhea however endorses some constipation over the past few days. ? ?Objective: ? ?Vital signs in last 24 hours: ?Vitals:  ? 09/19/21 0915 09/19/21 0930 09/19/21 1015 09/19/21 1115  ?BP:    130/75  ?Pulse: 84 75 85 89  ?Resp: '17 19 20 19  '$ ?Temp:    98.5 ?F (36.9 ?C)  ?TempSrc:    Oral  ?SpO2: 100% 100% 100% 100%  ? ?Weight change:  ? ?Intake/Output Summary (Last 24 hours) at 09/19/2021 1213 ?Last data filed at 09/19/2021 0127 ?Gross per 24 hour  ?Intake 1335.11 ml  ?Output --  ?Net 1335.11 ml  ? ?CBC ?   ?Component Value Date/Time  ? WBC 10.0 09/19/2021 0427  ? RBC 3.20 (L) 09/19/2021 0427  ? HGB 9.1 (L) 09/19/2021 0427  ? HGB 13.1 04/24/2021 0853  ? HCT 29.7 (L) 09/19/2021 0427  ? HCT 40.1 04/24/2021 0853  ? PLT 271 09/19/2021 0427  ? PLT 203 04/24/2021 0853  ? MCV 92.8 09/19/2021 0427  ? MCV 91 04/24/2021 0853  ? MCH 28.4 09/19/2021 0427  ? MCHC 30.6 09/19/2021 0427  ? RDW 16.3 (H) 09/19/2021 0427  ? RDW 15.8 (H) 04/24/2021 0853  ? LYMPHSABS 1.8 08/08/2021 0830  ? MONOABS 0.8 08/08/2021 0830  ? EOSABS 0.1 08/08/2021 0830  ? BASOSABS 0.0 08/08/2021 0830  ?  ? ?Physical exam:  ?Consitutional: alert and oriented, well-developed, in no acute distress ?HENT: normocephalic ?Cardiovascular: irregularly irregular, normal rate, normal heart sounds ?Pulmonary: normal  respiratory effort, normal breath sounds, no wheezes, rales, or rhonchi ?Abdomen: no TTP or distention  ?Skin: warm and dry. Mildly decreased skin turgor. Mild pitting edema in the lower extremities.  ?Psych: normal mood ? ?Assessment/Plan: ? ?Principal Problem: ?  AKI (acute kidney injury) ([place]) ? ?Patient is a 71 y.o. female with PMH significant for HFrEF, CKD stage 3b, permanent atrial fibrillation on Eliquis, and CAD with prior PCI who was admitted for hypokalemia induced rhabdomyolysis.  ? ?Severe hypokalemia induced rhabdomyolysis   ?AKI on CKD 3b, prerenal vs. Intrarenal  ?Patient's hypokalemia initially resolved however morning labs show her potassium is down to 2.7. She continues to have some weakness and decrease in appetite. Physical exam is overall reassuring however does have some mild bilateral lower extremity edema. CK is down-trending at 3845, down from 4412. She continues to receive gentle fluids to avoid volume overload in the setting of her heart failure with reduced EF. Magnesium 2.2. Kidney function is slowly improving with creatinine at 2.5, down from 2.9. Currently holding her Farxiga, allopurinol, losartan, and torsemide for. Pending protein/creatinine ratio pending. Suspect chronic torsemide use contributing to hypokalemia, however, cannot rule out hyperaldosteronism given persistent hypokalemia despite repletion.  ?-Additional  28mq BID K ordered  ?-LR 50 mL/hr x 10 hours   ?-Follow up renin/aldo ratio; lab collected, spironolactone 25 mg QD started ?-Trend CK until levels to 1000's ?-Continue holding  home Farxiga, allopurinol, losartan, and torsemide and avoid nephrotoxins.  ?-Trend BMP q8h  ? ?Elevated TFT's  ?TSH elevated 6.1, chronically elevated between 5-8. Free T4 also elevated 4.3. She is not on synthroid, stating her PCP did not address TSH in past. She denies any hyperthyroid symptoms, including feeling diarrhea, but she does have a hx of afib. She does complain of weight loss,  but only ~2 pounds over the last week or so. She does complain of constipation. HDS-- do not suspect thyroid storm. Labs concerning for possible pituitary adenoma. We will complete workup while she is admitted.  ?-T3 ?-ACTH ?-Prolactin  ?-Insulin-like growth factor  ?-LH ?-FSH  ?-Brain MRI  ?-Cortisol  ?-Telemetry monitoring   ? ?Transaminitis ?Patient denies any abdominal pain but continues to have poor appetite. Transaminitis likely transient secondary to rhabdomyolysis induced by severe hypokalemia. Hepatic function panel down trending. AST 182 down from 221. ALT 106 down from 125. RUQ [place], ethanol, acetaminophen were reassuring.  ?-Continue holding Rosuvastatin ?-Continue with tx above  ?-Continue to monitor hepatic function carefully ? ?HFrEF 35% (06/2021) ?Chronic and stable. She has some mild pitting edema in the lower extremities. Currently holding her Toresemide and [place] in the setting of her AKI and severe hypokalemia.  ?-Strict I/O's ?-Daily weights ? ?Permanent atrial fibrillation ?Chronic and stable. Anticoagulated on Eliquis.  ?-Continue Eliquis 5 mg BID ? ?HTN ?Blood pressure stable on Imdur.  ?-Continue home Imdur.  ? ?T2DM ?Currently holding Faxiga due to her hypokalemia and AKI.  ?-Continue home insulin glargine.  ?-CBG monitoring and SSI ? ?HLD ?Currently holding Rosuvastatin due to her transaminitis.  ?-Continue Zetia ? ?Gout ?Chronic and stable. Currently holding allopurinol in the setting of AKI ? ? LOS: 0 days  ? ?Clydell Hakim, Medical Student ?09/19/2021, 12:13 PM ? ?Pager number: [phone] ? ?Attestation for Student Documentation: ? ?I personally was present and performed or re-performed the history, physical exam and medical decision-making activities of this service and have verified that the service and findings are accurately documented in the student?s note. ? ?Lajean Manes, MD ?09/19/2021, 1:45 PM ?

## 2021-09-19 NOTE — Progress Notes (Signed)
NEW ADMISSION NOTE ?New Admission Note:   ? ?Arrival Method:  Stretcher ?Mental Orientation:  A&O x4 ?Telemetry: box09 ?Assessment: Completed ?Skin:  intact ?IV: Right AC ?Pain: Denies ?Tubes: none present ?Safety Measures: Safety Fall Prevention Plan has been given, discussed and signed ?Admission: Completed ?[facility] Orientation: Patient has been orientated to the room, unit and staff.  ?Family: daughter and husband ? ?Orders have been reviewed and implemented. Will continue to monitor the patient. Call light has been placed within reach and bed alarm has been activated.  ? ?Berneta Levins, RN   ?

## 2021-09-19 NOTE — ED Notes (Signed)
Patient assisted back into bed at this time; patient reports no complaints ?

## 2021-09-19 NOTE — ED Notes (Signed)
Patient wanted to sit up assisted patient to recliner.  ?

## 2021-09-19 NOTE — Progress Notes (Signed)
?  Date: 09/19/2021 ? ?Patient name: Stephanie Franco  ?Medical record number: [id number]  ?Date of birth: 1951/01/18  ? ?I have seen and evaluated [name] and discussed their care with the Residency Team. Briefly, Ms. Gerbino came in with dark urine, was found to have rhabdomyolysis, severely low K, aki on CKD and also with elevated TFTs.  She is clinically stable, has frequent PVCs on telemetry.  No acute complaints beyond discomfort and having to wait a long time for medications.  She notes an episode of 3 days of dizziness about 1 week ago which was difficult as she had trouble walking at that time.  She further notes occasional palpitations and constipation.  ? ? ?Vitals:  ? 09/19/21 0334 09/19/21 0700  ?BP: 134/65 125/72  ?Pulse: 91 90  ?Resp: 16 17  ?Temp: 98.4 ?F (36.9 ?C)   ?SpO2: 99% 100%  ? ?Elderly woman, sitting in chair, no distress.  Eyes anicteric.  Dry MM, skin turgor improved.  Regular rate with frequent extra beats, no murmur.  Mild LE edema to the mid shins.  ? ?Assessment and Plan: ?I have seen and evaluated the patient as outlined above. I agree with the formulated Assessment and Plan as detailed in the residents' note, with the following changes:  ? ?Rhabdomyolysis, AKI, myoglobinuria, low potassium, h/o CKD 3b ?- Check renin/aldo ?- IVF with NS or LR for 1 L today ?- Trend CK ?- Trend renal function ?- Continue to hold renally dose medications ? ?Elevated TFTs ?- Would check T3 ?- Check Brain MRI ?- Telemetry ?- Not currently in thyroid storm, possible central hyperthyroidism? ? ?Other issues per resident team daily note ? ?Sid Falcon, MD ?5/5/202310:32 AM ? ?

## 2021-09-19 NOTE — Plan of Care (Signed)
  Problem: Activity: Goal: Risk for activity intolerance will decrease Outcome: Progressing   Problem: Elimination: Goal: Will not experience complications related to urinary retention Outcome: Progressing   

## 2021-09-19 NOTE — ED Notes (Signed)
Transported to MRI

## 2021-09-19 NOTE — ED Notes (Signed)
Patient repositioned at this time

## 2021-09-19 NOTE — ED Notes (Signed)
Husband at bedside.  

## 2021-09-19 NOTE — ED Notes (Signed)
RN assumed care from previous RN at 1530. Per previous RN, report had been given verbally at 1515. When pt went to the floor this RN received call from floor RN that report had not been given. This RN gave verbal report.  ?

## 2021-09-20 ENCOUNTER — Inpatient Hospital Stay (HOSPITAL_COMMUNITY): Payer: Medicare Other

## 2021-09-20 LAB — BASIC METABOLIC PANEL
Anion gap: 11 (ref 5–15)
Anion gap: 11 (ref 5–15)
Anion gap: 10 (ref 5–15)
BUN: 31 mg/dL — ABNORMAL HIGH (ref 8–23)
BUN: 27 mg/dL — ABNORMAL HIGH (ref 8–23)
BUN: 27 mg/dL — ABNORMAL HIGH (ref 8–23)
CO2: 27 mmol/L (ref 22–32)
CO2: 28 mmol/L (ref 22–32)
CO2: 27 mmol/L (ref 22–32)
Calcium: 10.3 mg/dL (ref 8.9–10.3)
Calcium: 10.5 mg/dL — ABNORMAL HIGH (ref 8.9–10.3)
Calcium: 10.6 mg/dL — ABNORMAL HIGH (ref 8.9–10.3)
Chloride: 100 mmol/L (ref 98–111)
Chloride: 100 mmol/L (ref 98–111)
Chloride: 101 mmol/L (ref 98–111)
Creatinine, Ser: 2.36 mg/dL — ABNORMAL HIGH (ref 0.44–1.00)
Creatinine, Ser: 2.24 mg/dL — ABNORMAL HIGH (ref 0.44–1.00)
Creatinine, Ser: 2.12 mg/dL — ABNORMAL HIGH (ref 0.44–1.00)
GFR, Estimated: 21 mL/min — ABNORMAL LOW (ref >60)
GFR, Estimated: 23 mL/min — ABNORMAL LOW (ref >60)
GFR, Estimated: 24 mL/min — ABNORMAL LOW (ref >60)
Glucose, Bld: 129 mg/dL — ABNORMAL HIGH (ref 70–99)
Glucose, Bld: 109 mg/dL — ABNORMAL HIGH (ref 70–99)
Glucose, Bld: 100 mg/dL — ABNORMAL HIGH (ref 70–99)
Potassium: 3.5 mmol/L (ref 3.5–5.1)
Potassium: 3.3 mmol/L — ABNORMAL LOW (ref 3.5–5.1)
Potassium: 4.1 mmol/L (ref 3.5–5.1)
Sodium: 138 mmol/L (ref 135–145)
Sodium: 139 mmol/L (ref 135–145)
Sodium: 138 mmol/L (ref 135–145)

## 2021-09-20 LAB — GLUCOSE, CAPILLARY
Glucose-Capillary: 98 mg/dL (ref 70–99)
Glucose-Capillary: 132 mg/dL — ABNORMAL HIGH (ref 70–99)
Glucose-Capillary: 93 mg/dL (ref 70–99)
Glucose-Capillary: 117 mg/dL — ABNORMAL HIGH (ref 70–99)

## 2021-09-20 LAB — FOLLICLE STIMULATING HORMONE: FSH: 51.7 m[IU]/mL

## 2021-09-20 LAB — PROLACTIN: Prolactin: 16.1 ng/mL (ref 4.8–23.3)

## 2021-09-20 LAB — MAGNESIUM: Magnesium: 2.2 mg/dL (ref 1.7–2.4)

## 2021-09-20 LAB — CK: Total CK: 2988 U/L — ABNORMAL HIGH (ref 38–234)

## 2021-09-20 LAB — ACTH: C206 ACTH: 9 pg/mL (ref 7.2–63.3)

## 2021-09-20 LAB — LUTEINIZING HORMONE: LH: 27.7 m[IU]/mL

## 2021-09-20 MED ORDER — OXYCODONE HCL 5 MG PO TABS
10.0000 mg | ORAL_TABLET | Freq: Three times a day (TID) | ORAL | Status: DC | PRN
  Administered 2021-09-20 – 2021-09-26 (×9): 10 mg via ORAL
  Filled 2021-09-20 (×10): qty 2

## 2021-09-20 MED ORDER — POTASSIUM CHLORIDE CRYS ER 20 MEQ PO TBCR
40.0000 meq | EXTENDED_RELEASE_TABLET | Freq: Once | ORAL | Status: AC
Start: 2021-09-20 — End: 2021-09-20
  Administered 2021-09-20: 40 meq via ORAL
  Filled 2021-09-20: qty 2

## 2021-09-20 MED ORDER — LORAZEPAM 0.5 MG PO TABS
0.5000 mg | ORAL_TABLET | Freq: Once | ORAL | Status: AC
  Administered 2021-09-20: 0.5 mg via ORAL
  Filled 2021-09-20: qty 1

## 2021-09-20 NOTE — Evaluation (Signed)
Physical Therapy Evaluation & Discharge ?Patient Details ?Name: Stephanie Franco ?MRN: [id number] ?DOB: 05-10-1951 ?Today's Date: 09/20/2021 ? ?History of Present Illness ? 71 y/o female presented to ED on 09/18/21 for concern for UTI. Admitted for AKI and severe hypokalemia. PMH: Afib on Eliquis, CAD w/ prior PCI, HFrEF, HTN, T2DM, breast cancer s/p mastectomy (2008), obesity.  ?Clinical Impression ? Patient admitted with the above. Patient presents with decreased activity tolerance. Patient ambulating modI in room with SPC. Patient politely declines hallway ambulation this date due to fatigue. Encouraged hallway ambulation with husband, nursing staff, or mobility specialist while admitted. No further skilled PT needs identified acutely. Recommend HHPT at discharge to assist with return to PLOF.    ?   ? ?Recommendations for follow up therapy are one component of a multi-disciplinary discharge planning process, led by the attending physician.  Recommendations may be updated based on patient status, additional functional criteria and insurance authorization. ? ?Follow Up Recommendations Home health PT ? ?  ?Assistance Recommended at Discharge Intermittent Supervision/Assistance  ?Patient can return home with the following ?   ? ?  ?Equipment Recommendations None recommended by PT  ?Recommendations for Other Services ?    ?  ?Functional Status Assessment Patient has had a recent decline in their functional status and demonstrates the ability to make significant improvements in function in a reasonable and predictable amount of time.  ? ?  ?Precautions / Restrictions Precautions ?Precautions: Fall ?Restrictions ?Weight Bearing Restrictions: No  ? ?  ? ?Mobility ? Bed Mobility ?  ?  ?  ?  ?  ?  ?  ?General bed mobility comments: in bathroom on arrival ?  ? ?Transfers ?Overall transfer level: Modified independent ?Equipment used: Straight cane ?  ?  ?  ?  ?  ?  ?  ?  ?  ? ?Ambulation/Gait ?Ambulation/Gait assistance: Modified  independent (Device/Increase time) ?Gait Distance (Feet): 20 Feet ?Assistive device: Straight cane ?Gait Pattern/deviations: Step-through pattern, Decreased stride length ?Gait velocity: decreased ?  ?  ?General Gait Details: very slow gait but modI with cane. Patient expressing fatigue and poor activity tolerance today. Politely declined hallway ambulation ? ?Stairs ?  ?  ?  ?  ?  ? ?Wheelchair Mobility ?  ? ?Modified Rankin (Stroke Patients Only) ?  ? ?  ? ?Balance Overall balance assessment: Mild deficits observed, not formally tested ?  ?  ?  ?  ?  ?  ?  ?  ?  ?  ?  ?  ?  ?  ?  ?  ?  ?  ?   ? ? ? ?Pertinent Vitals/Pain Pain Assessment ?Pain Assessment: No/denies pain  ? ? ?Home Living Family/patient expects to be discharged to:: Private residence ?Living Arrangements: Spouse/significant other ?Available Help at Discharge: Family;Available 24 hours/day ?Type of Home: Apartment ?Home Access: Level entry ?  ?  ?  ?Home Layout: One level ?Home Equipment: Grab bars - tub/shower;Rollator (4 wheels);Cane - single point;Other (comment) ?Additional Comments: has home health care aide that comes on saturdays to "check in and do whatever we need"  ?  ?Prior Function Prior Level of Function : Needs assist ?  ?  ?  ?  ?  ?  ?Mobility Comments: Around the house does not use AD, brings walking stick outside ?ADLs Comments: Patient reports she can manage herself with self care, however asks spouse to help her don shoes/socks/ADL tasks as needed ?  ? ? ?Hand Dominance  ? Dominant  Hand: Right ? ?  ?Extremity/Trunk Assessment  ? Upper Extremity Assessment ?Upper Extremity Assessment: Defer to OT evaluation ?  ? ?Lower Extremity Assessment ?Lower Extremity Assessment: Overall WFL for tasks assessed ?  ? ?Cervical / Trunk Assessment ?Cervical / Trunk Assessment: Kyphotic  ?Communication  ? Communication: No difficulties  ?Cognition Arousal/Alertness: Awake/alert ?Behavior During Therapy: [facility] for tasks assessed/performed ?Overall  Cognitive Status: Within Functional Limits for tasks assessed ?  ?  ?  ?  ?  ?  ?  ?  ?  ?  ?  ?  ?  ?  ?  ?  ?  ?  ?  ? ?  ?General Comments   ? ?  ?Exercises    ? ?Assessment/Plan  ?  ?PT Assessment Patient does not need any further PT services  ?PT Problem List   ? ?   ?  ?PT Treatment Interventions     ? ?PT Goals (Current goals can be found in the Care Plan section)  ?Acute Rehab PT Goals ?Patient Stated Goal: to get my strength back ?PT Goal Formulation: All assessment and education complete, DC therapy ? ?  ?Frequency   ?  ? ? ?Co-evaluation   ?  ?  ?  ?  ? ? ?  ?AM-PAC PT "6 Clicks" Mobility  ?Outcome Measure Help needed turning from your back to your side while in a flat bed without using bedrails?: None ?Help needed moving from lying on your back to sitting on the side of a flat bed without using bedrails?: None ?Help needed moving to and from a bed to a chair (including a wheelchair)?: None ?Help needed standing up from a chair using your arms (e.g., wheelchair or bedside chair)?: None ?Help needed to walk in hospital room?: None ?Help needed climbing 3-5 steps with a railing? : None ?6 Click Score: 24 ? ?  ?End of Session   ?Activity Tolerance: Patient limited by fatigue ?Patient left: in chair;with call bell/phone within reach;with family/visitor present ?Nurse Communication: Mobility status ?PT Visit Diagnosis: Muscle weakness (generalized) (M62.81) ?  ? ?Time: 2010-0712 ?PT Time Calculation (min) (ACUTE ONLY): 10 min ? ? ?Charges:   PT Evaluation ?$PT Eval Low Complexity: 1 Low ?  ?  ?   ? ? ?Alexzandra Bilton A. Gilford Rile, PT, DPT ?Acute Rehabilitation Services ?Pager [phone] ?Office [phone] ? ? ?Kyro Joswick A Derra Shartzer ?09/20/2021, 12:35 PM ? ?

## 2021-09-20 NOTE — Progress Notes (Signed)
? ?Subjective: ?Ms. Seltzer is a 71 y.o. female with PMH significant for CKD stage 3b, HFrEF 35%, persistent atrial fibrillation, T2DM, HLD, CAD who was admitted for rhabdomyolysis induced by severe hypokalemia and was also noted to have abnormal thyroid function panel. No acute events overnight. On morning rounds patient states she is in better spirits compared to yesterday with the support of her husband and daughter. She continues to have some generalized weakness however feels that it has improved from yesterday. Her appetite is slowly returning as well and was able to eat most of her breakfast. She endorses some shortness of breath upon waking up this morning however none at present. She denies any abdominal pain, nausea, vomiting.  ? ? ?Objective: ? ?Vital signs in last 24 hours: ?Vitals:  ? 09/19/21 2357 09/20/21 0359 09/20/21 5366 09/20/21 0946  ?BP: 129/80 123/73  111/70  ?Pulse: 91 100  100  ?Resp: '18 18  18  '$ ?Temp: 98.6 ?F (37 ?C) 98.3 ?F (36.8 ?C)  99.2 ?F (37.3 ?C)  ?TempSrc: Oral Oral  Oral  ?SpO2: 98% 100%  98%  ?Weight:   82.9 kg   ?Height:      ? ?Weight change:  ? ?Intake/Output Summary (Last 24 hours) at 09/20/2021 0955 ?Last data filed at 09/20/2021 0200 ?Gross per 24 hour  ?Intake 236.61 ml  ?Output 0 ml  ?Net 236.61 ml  ? ?BMP 09/20/2021 ?Sodium 139, Potassium 3.3, Chloride 100, glucose 109, BUN 27, creatinine 2.24, calcium 10.5, GFR 23.  ?  ?Magnesium 2.2 ? ?CK 2988 ? ?MRI brain without contrast 09/19/2021 ?IMPRESSION: ?1. No obvious intracranial metastatic disease or pituitary mass; however, malignancy is not excluded on this noncontrast study. ?Recommend postcontrast whole brain imaging and pituitary protocol. ?2. Moderate chronic microvascular ischemic disease. ?3. Multiple areas of abnormal signal within the visualized cervical spine, skull base, and calvarium, suspicious for osseous metastatic disease. The recommended postcontrast imaging can better assess. ? ?Physical Exam   ? ?Constitutional: alert and oriented x3. Sitting comfortably and in no acute distress.  ?Cardiovascular: irregularly irregular rhythm. Normal rate. No murmurs.  ?Respiratory: Bibasilar rales. No increased work of breathing.  ?Abdominal: Normal bowel sounds. No abdominal tenderness to palpation ?Skin: warm and dry. Mild bilateral edema of the lower extremities.  ?Psych: Normal mood ? ?Assessment/Plan: ? ?Principal Problem: ?  AKI (acute kidney injury) ([place]) ? ?Severe hypokalemia induced rhabdomyolysis, improved ?AKI on CKD 3b ?Patient is overall feeling improved today with slow improvements in her generalized weakness as well as her appetites. Patient's potassium level continues to fluctuate with most recent value at 3.3. Kidney function continues to slowly improve with creatinine 2.36, down from 2.47 yesterday. CK levels continue to downtrend as well. Currently holding her Farxiga, allopurinol, losartan, and torsemide. Suspect chronic torsemide use contributing to hypokalemia. Pending aldosterone + renin levels to rule out hyperaldosteronism. Currently receiving for spironolactone 25 mg QD for diuresis in the setting of her heart failure with hypokalemia.  ?-Additional 40 mEq K ordered ?-We will hold fluids due to evidence of bibasilar rales noted on exam this morning. Encouraging PO intake and will closely monitor kidney function.  ?-Continue close monitoring with BMP q6h ?-Continue CK trend until levels to 1000's ? 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  FSH, LH, cortisol, and prolactin levels are all within  normal limits. Patient not on any thyroxine replacement therapy, amiodarone or heparin. MRI without contrast did not show obvious pituitary mass however radiologist recommended  postcontrast whole brain imaging and pituitary protocol for further evaluation.  ?-Continue monitoring while awaiting further lab results as well as imaging.  ?-We will wait to proceed with MRI with contrast for further evaluation due to patient's AKI.  ? ?Transaminitis ?Patient denies any abdominal pain and does have improvements in her appetites.  ?-Continue to hold Rosuvastatin ?-Continue with tx for rhabdomyolysis induced by severe hypokalemia ? ?HFrEF 35% (06/2021) ?Patient noted some increased shortness of breath this morning. On exam patient has bibasilar rales as well as mild lower extremity edema.  ?-We will hold on fluids for now and encourage PO intake ?-Continue holding Torsemide and [place].  ?-Gentle diuresis with spironolactone 25 mg QD.  ? ?Concern for metastatic disease ?History of breast cancer s/p bilateral mastectomy  ?Patient has history of breast cancer s/p bilateral mastectomy in 2008. MRI brain without contrast noted abnormalities in the cervical spine, skull base and calvarium suspicious for osseous metastatic disease. We will further assess with MRI C-spine once patient is kidney function improves.  ?--MRI with contrast C-spine tomorrow ? ?Permanent atrial fibrillation ?Chronic and stable. Anticoagulated on Eliquis.  ?-Continue Eliquis 5 mg BID.  ? ?HTN ?Blood pressure stable on Imdur ?-Continue blood pressure control ? ?T2DM ?Holding [place] due to hypokalemia and AKI. Blood glucose under good control here.  ?-Continue Semglee and SSI.  ? ?HLD ?Holding rosuvastatin due to transaminitis. ?-Continue Zetia ? ? LOS: 1 day  ? ?Clydell Hakim, Medical Student ?09/20/2021, 9:55 AM ? ?Pager number: [phone] ? ?I have reviewed the note by Clydell Hakim, MS3 and was present during the interview and physical exam.  I agree with the findings, assessment, and plan. ? ?Lacinda Axon, MD ?09/20/2021, 11:56 AM ?IM Resident, PGY-2 ?Pager: [phone] ?Isaiah 41:10 ? ?

## 2021-09-20 NOTE — Evaluation (Signed)
Occupational Therapy Evaluation ?Patient Details ?Name: Stephanie Franco ?MRN: [id number] ?DOB: February 04, 1951 ?Today's Date: 09/20/2021 ? ? ?History of Present Illness Patient is a 71 year old female presenting with dark urine, loss of appetite, and generalized weakness x 2 days. Dx with Severe hypokalemia induced rhabdomyolysis. MRI reads: Multiple areas of abnormal signal within the visualized cervical spine, skull base, and calvarium, suspicious for osseous metastatic disease and recommend f/u MRI with contrast. PMH: permanent atrial fibrillation on Eliquis, CAD with prior PCI, HFrEF 35% (06/2021), HTN, HLD, T2DM, breast cancer s/p mastectomy (2008),  ? ?Clinical Impression ?  ?Patient lives in an apartment with spouse and is independent with ambulation at baseline, uses cane or walker as needed if feeling "under the weather." Generally patient is independent with self care tasks, states spouse assists with donning socks/shoes as needed. Patient able to don L sock, needing assist with R due to hx of hip surgery. Educate patient on sock aids states "I've seen them on TV" and not interested at this time. Patient able to ambulate to/from bathroom, perform peri care/clothing management and wash hands at sink without physical assistance. Patient reports she feels 80% her normal, encourage continued mobility while in hospital. No further acute OT needs at this time, will sign off.  ?   ? ?Recommendations for follow up therapy are one component of a multi-disciplinary discharge planning process, led by the attending physician.  Recommendations may be updated based on patient status, additional functional criteria and insurance authorization.  ? ?Follow Up Recommendations ? No OT follow up  ?  ?Assistance Recommended at Discharge PRN  ?Patient can return home with the following A little help with bathing/dressing/bathroom ? ?  ?Functional Status Assessment ? Patient has not had a recent decline in their functional status   ?Equipment Recommendations ? None recommended by OT  ?  ?   ?Precautions / Restrictions Restrictions ?Weight Bearing Restrictions: No  ? ?  ? ?Mobility Bed Mobility ?  ?  ?  ?  ?  ?  ?  ?General bed mobility comments: sitting at edge of bed upon arrival ?  ? ?Transfers ?Overall transfer level: Modified independent ?Equipment used: Straight cane ?  ?  ?  ?  ?  ?  ?  ?  ?  ? ?  ?Balance Overall balance assessment: Modified Independent ?  ?  ?  ?  ?  ?  ?  ?  ?  ?  ?  ?  ?  ?  ?  ?  ?  ?  ?   ? ?ADL either performed or assessed with clinical judgement  ? ?ADL Overall ADL's : At baseline ?  ?  ?  ?  ?  ?  ?  ?  ?  ?  ?  ?  ?  ?  ?  ?  ?  ?  ?  ?General ADL Comments: Patient demonstrates ability to ambulate in her room, use toilet, perform perianal care/clothing management and wash her hands at the sink without physical assistance. Patient needing assist to don R sock d/t history of hip surgery and states spouse helps with this as needed at home. Able to don L sock.  ? ? ? ? ?Pertinent Vitals/Pain Pain Assessment ?Pain Assessment: No/denies pain  ? ? ? ?Hand Dominance Right ?  ?Extremity/Trunk Assessment Upper Extremity Assessment ?Upper Extremity Assessment: Overall WFL for tasks assessed ?  ?Lower Extremity Assessment ?Lower Extremity Assessment: Defer to PT evaluation ?  ?  ?  ?  Communication Communication ?Communication: No difficulties ?  ?Cognition Arousal/Alertness: Awake/alert ?Behavior During Therapy: [facility] for tasks assessed/performed ?Overall Cognitive Status: Within Functional Limits for tasks assessed ?  ?  ?  ?  ?  ?  ?  ?  ?  ?  ?  ?  ?  ?  ?  ?  ?  ?  ?  ?   ?   ?   ? ? ?Home Living Family/patient expects to be discharged to:: Private residence ?Living Arrangements: Spouse/significant other ?Available Help at Discharge: Family;Available 24 hours/day ?Type of Home: Apartment ?Home Access: Level entry ?  ?  ?Home Layout: One level ?  ?  ?Bathroom Shower/Tub: Tub/shower unit ?  ?Bathroom Toilet: Handicapped  height ?  ?  ?Home Equipment: Grab bars - tub/shower;Rollator (4 wheels);Cane - single point;Other (comment) (walking stick) ?  ?Additional Comments: has home health care aide that comes on saturdays to "check in and do whatever we need" ?  ? ?  ?Prior Functioning/Environment Prior Level of Function : Needs assist ?  ?  ?  ?  ?  ?  ?Mobility Comments: Around the house does not use AD, brings walking stick outside ?ADLs Comments: Patient reports she can manage herself with self care, however asks spouse to help her don shoes/socks/ADL tasks as needed ?  ? ?  ?  ?OT Problem List: Decreased activity tolerance ?  ?   ?   ?OT Goals(Current goals can be found in the care plan section) Acute Rehab OT Goals ?Patient Stated Goal: Home with spouse ?OT Goal Formulation: All assessment and education complete, DC therapy  ? ?AM-PAC OT "6 Clicks" Daily Activity     ?Outcome Measure Help from another person eating meals?: None ?Help from another person taking care of personal grooming?: None ?Help from another person toileting, which includes using toliet, bedpan, or urinal?: None ?Help from another person bathing (including washing, rinsing, drying)?: A Little ?Help from another person to put on and taking off regular upper body clothing?: None ?Help from another person to put on and taking off regular lower body clothing?: A Little ?6 Click Score: 22 ?  ?End of Session Equipment Utilized During Treatment: Other (comment) Kasandra Knudsen) ?Nurse Communication: Mobility status ? ?Activity Tolerance: Patient tolerated treatment well ?Patient left: in chair;with call bell/phone within reach;with family/visitor present ? ?OT Visit Diagnosis: Other abnormalities of gait and mobility (R26.89)  ?              ?Time: 1660-6301 ?OT Time Calculation (min): 16 min ?Charges:  OT General Charges ?$OT Visit: 1 Visit ?OT Evaluation ?$OT Eval Low Complexity: 1 Low ? ?Delbert Phenix OT ?OT pager: [phone] ? ?Rosemary Holms ?09/20/2021, 11:13 AM ?

## 2021-09-21 ENCOUNTER — Inpatient Hospital Stay (HOSPITAL_COMMUNITY): Payer: Medicare Other

## 2021-09-21 DIAGNOSIS — N179 Acute kidney failure, unspecified: Secondary | ICD-10-CM | POA: Diagnosis not present

## 2021-09-21 LAB — CBC WITH DIFFERENTIAL/PLATELET
Abs Immature Granulocytes: 0.03 10*3/uL (ref 0.00–0.07)
Basophils Absolute: 0 10*3/uL (ref 0.0–0.1)
Basophils Relative: 0 %
Eosinophils Absolute: 0.1 10*3/uL (ref 0.0–0.5)
Eosinophils Relative: 1 %
HCT: 29.8 % — ABNORMAL LOW (ref 36.0–46.0)
Hemoglobin: 9 g/dL — ABNORMAL LOW (ref 12.0–15.0)
Immature Granulocytes: 0 %
Lymphocytes Relative: 12 %
Lymphs Abs: 1.2 10*3/uL (ref 0.7–4.0)
MCH: 28 pg (ref 26.0–34.0)
MCHC: 30.2 g/dL (ref 30.0–36.0)
MCV: 92.8 fL (ref 80.0–100.0)
Monocytes Absolute: 0.9 10*3/uL (ref 0.1–1.0)
Monocytes Relative: 9 %
Neutro Abs: 8.1 10*3/uL — ABNORMAL HIGH (ref 1.7–7.7)
Neutrophils Relative %: 78 %
Platelets: 269 10*3/uL (ref 150–400)
RBC: 3.21 MIL/uL — ABNORMAL LOW (ref 3.87–5.11)
RDW: 16.1 % — ABNORMAL HIGH (ref 11.5–15.5)
WBC: 10.4 10*3/uL (ref 4.0–10.5)
nRBC: 0 % (ref 0.0–0.2)

## 2021-09-21 LAB — BASIC METABOLIC PANEL
Anion gap: 11 (ref 5–15)
Anion gap: 10 (ref 5–15)
BUN: 27 mg/dL — ABNORMAL HIGH (ref 8–23)
BUN: 26 mg/dL — ABNORMAL HIGH (ref 8–23)
CO2: 26 mmol/L (ref 22–32)
CO2: 27 mmol/L (ref 22–32)
Calcium: 10.4 mg/dL — ABNORMAL HIGH (ref 8.9–10.3)
Calcium: 10.7 mg/dL — ABNORMAL HIGH (ref 8.9–10.3)
Chloride: 102 mmol/L (ref 98–111)
Chloride: 103 mmol/L (ref 98–111)
Creatinine, Ser: 2.1 mg/dL — ABNORMAL HIGH (ref 0.44–1.00)
Creatinine, Ser: 2.03 mg/dL — ABNORMAL HIGH (ref 0.44–1.00)
GFR, Estimated: 25 mL/min — ABNORMAL LOW (ref >60)
GFR, Estimated: 26 mL/min — ABNORMAL LOW (ref >60)
Glucose, Bld: 119 mg/dL — ABNORMAL HIGH (ref 70–99)
Glucose, Bld: 140 mg/dL — ABNORMAL HIGH (ref 70–99)
Potassium: 3.9 mmol/L (ref 3.5–5.1)
Potassium: 3.8 mmol/L (ref 3.5–5.1)
Sodium: 139 mmol/L (ref 135–145)
Sodium: 140 mmol/L (ref 135–145)

## 2021-09-21 LAB — HEPATIC FUNCTION PANEL
ALT: 113 U/L — ABNORMAL HIGH (ref 0–44)
AST: 163 U/L — ABNORMAL HIGH (ref 15–41)
Albumin: 2.5 g/dL — ABNORMAL LOW (ref 3.5–5.0)
Alkaline Phosphatase: 78 U/L (ref 38–126)
Bilirubin, Direct: 0.3 mg/dL — ABNORMAL HIGH (ref 0.0–0.2)
Indirect Bilirubin: 0.4 mg/dL (ref 0.3–0.9)
Total Bilirubin: 0.7 mg/dL (ref 0.3–1.2)
Total Protein: 6.6 g/dL (ref 6.5–8.1)

## 2021-09-21 LAB — URINALYSIS, ROUTINE W REFLEX MICROSCOPIC
Bacteria, UA: NONE SEEN
Bilirubin Urine: NEGATIVE
Glucose, UA: >=500 mg/dL — AB
Ketones, ur: NEGATIVE mg/dL
Leukocytes,Ua: NEGATIVE
Nitrite: NEGATIVE
Protein, ur: 100 mg/dL — AB
Specific Gravity, Urine: 1.016 (ref 1.005–1.030)
pH: 5 (ref 5.0–8.0)

## 2021-09-21 LAB — GLUCOSE, CAPILLARY
Glucose-Capillary: 129 mg/dL — ABNORMAL HIGH (ref 70–99)
Glucose-Capillary: 135 mg/dL — ABNORMAL HIGH (ref 70–99)
Glucose-Capillary: 92 mg/dL (ref 70–99)
Glucose-Capillary: 129 mg/dL — ABNORMAL HIGH (ref 70–99)

## 2021-09-21 MED ORDER — ENSURE ENLIVE PO LIQD
237.0000 mL | Freq: Three times a day (TID) | ORAL | Status: DC
  Administered 2021-09-21 – 2021-09-26 (×12): 237 mL via ORAL

## 2021-09-21 MED ORDER — GADOBUTROL 1 MMOL/ML IV SOLN
8.5000 mL | Freq: Once | INTRAVENOUS | Status: AC | PRN
  Administered 2021-09-21: 8.5 mL via INTRAVENOUS

## 2021-09-21 MED ORDER — LORAZEPAM 2 MG/ML IJ SOLN
1.0000 mg | Freq: Once | INTRAMUSCULAR | Status: AC | PRN
  Administered 2021-09-21: 1 mg via INTRAVENOUS
  Filled 2021-09-21: qty 1

## 2021-09-21 NOTE — Progress Notes (Signed)
? ?Subjective:  ? ?Hospital day:3 ? ?Overnight event: No acute events overnight ? ?Interim History: Patient was evaluated at the bedside sitting on the side of her bed. Patient states she feels fine but reported to have a fever of 101 overnight. She denies any respiratory symptoms, cold and cough symptoms, abdominal pain, urinary symptoms or back pain.  Patient called her spouse on the phone and all questions were answered appropriately. They were informed of improvement in kidney function and plan for MRI with contrast today to further assess for possible malignancy. ? ?Objective: ? ?Vital signs in last 24 hours: ?Vitals:  ? 09/20/21 2141 09/20/21 2227 09/21/21 0503 09/21/21 0750  ?BP: 129/84  (!) 135/97 120/87  ?Pulse: 70  (!) 110 93  ?Resp: 16  18   ?Temp: 100.2 ?F (37.9 ?C) (!) 101 ?F (38.3 ?C) 99.2 ?F (37.3 ?C) 98.3 ?F (36.8 ?C)  ?TempSrc:  Oral  Oral  ?SpO2: 100%  100% 97%  ?Weight:      ?Height:      ? ? ?Filed Weights  ? 09/19/21 1752 09/20/21 2197  ?Weight: 81.1 kg 82.9 kg  ? ? ? ?Intake/Output Summary (Last 24 hours) at 09/21/2021 1223 ?Last data filed at 09/21/2021 5883 ?Gross per 24 hour  ?Intake 660 ml  ?Output 0 ml  ?Net 660 ml  ? ?Net IO Since Admission: 2,231.72 mL [09/21/21 1223] ? ?Recent Labs  ?  09/20/21 ?2142 09/21/21 ?0745 09/21/21 ?1121  ?GLUCAP 117* 129* 135*  ?  ? ?Pertinent Labs: ? ?  Latest Ref Rng & Units 09/19/2021  ?  4:27 AM 09/18/2021  ?  1:41 PM 08/08/2021  ?  8:30 AM  ?CBC  ?WBC 4.0 - 10.5 K/uL 10.0   10.4   8.3    ?Hemoglobin 12.0 - 15.0 g/dL 9.1   10.4   10.7    ?Hematocrit 36.0 - 46.0 % 29.7   33.2   34.8    ?Platelets 150 - 400 K/uL 271   309   276    ? ? ? ?  Latest Ref Rng & Units 09/21/2021  ?  7:21 AM 09/21/2021  ? 12:45 AM 09/20/2021  ?  4:10 PM  ?CMP  ?Glucose 70 - 99 mg/dL 140   119   100    ?BUN 8 - 23 mg/dL '26   27   27    '$ ?Creatinine 0.44 - 1.00 mg/dL 2.03   2.10   2.12    ?Sodium 135 - 145 mmol/L 140   139   138    ?Potassium 3.5 - 5.1 mmol/L 3.8   3.9   4.1    ?Chloride 98 - 111  mmol/L 103   102   101    ?CO2 22 - 32 mmol/L '27   26   27    '$ ?Calcium 8.9 - 10.3 mg/dL 10.7   10.4   10.6    ? ? ?Imaging: ?DG CHEST PORT 1 VIEW ? ?Result Date: 09/21/2021 ?CLINICAL DATA:  Dyspnea EXAM: PORTABLE CHEST 1 VIEW COMPARISON:  08/08/2021 FINDINGS: Stable cardiomegaly. Aortic atherosclerosis. Unchanged calcified intrathoracic lymph nodes and scattered granulomas. No focal airspace consolidation, pleural effusion, or pneumothorax. Lytic/permeative appearance of the distal right clavicle. IMPRESSION: 1. No acute cardiopulmonary abnormality. 2. Lytic/permeative appearance of the distal right clavicle, suspicious for osseous metastatic disease. Electronically Signed   By: Davina Poke D.O.   On: 09/21/2021 12:14   ? ?Physical Exam ? ?General: Pleasant, well-appearing elderly woman sitting on side  of bed. No acute distress. ?CV: Irregularly irregular rhythm.  Normal rate. No m/r/g. Trace BLE edema. ?Pulmonary: Lungs CTAB. Normal effort. No wheezing. Distant rales at the bases. ?Abdominal: Soft, nontender, nondistended. Normal bowel sounds. ?Extremities: 2+ distal pulses. Normal ROM. ?Skin: Warm and dry. No obvious rash or lesions. ?Neuro: A&Ox3. Moves all extremities. Normal sensation to gross touch.  ?Psych: Normal mood and affect ? ?Assessment/Plan: ?Stephanie Franco is a 71 y.o. female with hx of CKD stage 3b, HFrEF (EF 35%), persistent atrial fibrillation, T2DM, HLD, CAD who was admitted for rhabdomyolysis induced by severe hypokalemia and was also noted to have abnormal thyroid labs concerning for possible pituitary adenoma.  ? ?Principal Problem: ?  AKI (acute kidney injury) ([place]) ? ? 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 Patient endorse some neck and shoulder pain but states this is chronic. CXR today shows lytic appearance of the distal right  clavicle suspicious for osseous metastatic disease. Plan to get MRI today with improvement in kidney function. ?--F/u MRI brain, MRI C-spine ?--T3 pending ?--PT recommending home health PT ?--Continue home Oxy 10 mg TID PRN ? ?Fever ?Patient found to have fever of 101 overnight.  This is likely due to possible metastatic disease. Infectious work-up ongoing to rule out new infection. Patient denies any shortness of breath, abdominal pain, dysuria, N/V, diarrhea, back pain, cold or cough symptoms. States she did not feel feverish overnight. Skin exam normal. UA and chest x-ray did not show any evidence of infection. ?--Follow-up blood cultures, CBC with differential ?--Trend fever curve, WBC ? ?Severe hypokalemia induced rhabdomyolysis, resolved ?AKI on CKD 3b, improved ?Baseline creatinine ~2.0. K+ improved to 3.8. Creatinine improved to 2.03. CK trending down.  No urine output documented, however per nurse, patient has been urinating. ?--Continue to monitor with daily BMP ?--K+ supplementation as needed.  ?  ?Transaminitis ?Trending down. She continues to deny any abdominal pain. ?--Follow-up hepatic function panel ?--Change to her rosuvastatin ?  ?HFrEF 35% (06/2021) ?Patient remains euvolemic on exam. CXR today does not show any pleural effusion or pulmonary edema. ?--Continue holding Torsemide and [place].  ?--Continue spironolactone 25 mg QD.  ?  ?Permanent atrial fibrillation ?Chronic and stable. Anticoagulated on Eliquis. Patient remains in A-fib on telemetry. ?--Continue Eliquis 5 mg BID.  ?  ?HTN ?BP remained stable with SBP in the 120s to 130s. ?--Continue blood pressure control ?  ?T2DM ?Holding [place] due to hypokalemia and AKI.  CBGs in the 110s to 130s in the past 24 hours. ?--Continue Semglee and SSI.  ?  ?HLD ?Holding rosuvastatin due to transaminitis. ?--Continue Zetia ?  ? ?Diet: Carb modified ?IVF: None ?VTE: Eliquis ?CODE: Full ? ?Prior to Admission Living Arrangement: Home ?Anticipated Discharge  Location: Home with home health PT ?Barriers to Discharge: Medical work-up ?Dispo: Anticipated discharge in approximately 1-2 day(s).  ? ?Signed: ?Lacinda Axon, MD ?09/21/2021, 12:23 PM  ?Pager: [phone] ?Internal Medicine Teaching Service ?After 5pm on weekdays and 1pm on weekends: On Call pager: [phone] ? ?

## 2021-09-21 NOTE — Progress Notes (Signed)
Mobility Specialist: Progress Note ? ? 09/21/21 1327  ?Mobility  ?Activity Refused mobility  ? ?Pt refused mobility stating she would like to rest and is waiting to be taken down for imaging. Will f/u as able.  ? ?Harrell Gave Whitten Andreoni ?Mobility Specialist ?Mobility Specialist [place]: [phone] ?Mobility Specialist [place]: [phone] ? ?

## 2021-09-21 NOTE — Progress Notes (Signed)
Initial Nutrition Assessment ? ?DOCUMENTATION CODES:  ? ?Obesity unspecified ? ?INTERVENTION:  ?Provide Ensure Enlive po TID, each supplement provides 350 kcal and 20 grams of protein. ? ?Encourage adequate PO intake.  ? ?NUTRITION DIAGNOSIS:  ? ?Increased nutrient needs related to chronic illness (HF) as evidenced by estimated needs. ? ?GOAL:  ? ?Patient will meet greater than or equal to 90% of their needs ? ?MONITOR:  ? ?PO intake, Supplement acceptance, Labs, Weight trends, Skin, I & O's ? ?REASON FOR ASSESSMENT:  ? ?Malnutrition Screening Tool ?  ? ?ASSESSMENT:  ? ?71 y.o. female with hx of CKD stage 3b, HFrEF (EF 35%), persistent atrial fibrillation, T2DM, HLD, CAD who was admitted for rhabdomyolysis induced by severe hypokalemia and was also noted to have abnormal thyroid labs concerning for possible pituitary adenoma. ? ?Plan for MRI today to further assess for possible malignancy. Pt unavailable at time of contact. Meal completion poor at 25%. RD to order nutritional supplements to aid in caloric and protein needs. Unable to complete Nutrition-Focused physical exam at this time.  ? ?Labs and medications reviewed.  ? ?Diet Order:   ?Diet Order   ? ?       ?  Diet Carb Modified Fluid consistency: Thin; Room service appropriate? Yes  Diet effective now       ?  ? ?  ?  ? ?  ? ? ?EDUCATION NEEDS:  ? ?Not appropriate for education at this time ? ?Skin:  Skin Assessment: Reviewed RN Assessment ? ?Last BM:  5/5 ? ?Height:  ? ?Ht Readings from Last 1 Encounters:  ?09/19/21 '5\' 3"'$  (1.6 m)  ? ? ?Weight:  ? ?Wt Readings from Last 1 Encounters:  ?09/20/21 82.9 kg  ? ?BMI:  Body mass index is 32.37 kg/m?. ? ?Estimated Nutritional Needs:  ? ?Kcal:  1850-2050 ? ?Protein:  90-105 grams ? ?Fluid:  >/= 1.8 L/day ? ?Corrin Parker, MS, RD, LDN ?RD pager number/after hours weekend pager number on Amion. ? ?

## 2021-09-22 ENCOUNTER — Inpatient Hospital Stay (HOSPITAL_COMMUNITY): Payer: Medicare Other

## 2021-09-22 DIAGNOSIS — D649 Anemia, unspecified: Secondary | ICD-10-CM

## 2021-09-22 DIAGNOSIS — E119 Type 2 diabetes mellitus without complications: Secondary | ICD-10-CM

## 2021-09-22 DIAGNOSIS — C493 Malignant neoplasm of connective and soft tissue of thorax: Secondary | ICD-10-CM | POA: Diagnosis not present

## 2021-09-22 DIAGNOSIS — Z853 Personal history of malignant neoplasm of breast: Secondary | ICD-10-CM | POA: Diagnosis not present

## 2021-09-22 DIAGNOSIS — I10 Essential (primary) hypertension: Secondary | ICD-10-CM

## 2021-09-22 DIAGNOSIS — N179 Acute kidney failure, unspecified: Secondary | ICD-10-CM

## 2021-09-22 DIAGNOSIS — I4891 Unspecified atrial fibrillation: Secondary | ICD-10-CM

## 2021-09-22 DIAGNOSIS — E785 Hyperlipidemia, unspecified: Secondary | ICD-10-CM

## 2021-09-22 DIAGNOSIS — R946 Abnormal results of thyroid function studies: Secondary | ICD-10-CM

## 2021-09-22 DIAGNOSIS — N189 Chronic kidney disease, unspecified: Secondary | ICD-10-CM

## 2021-09-22 DIAGNOSIS — I2510 Atherosclerotic heart disease of native coronary artery without angina pectoris: Secondary | ICD-10-CM

## 2021-09-22 DIAGNOSIS — R7401 Elevation of levels of liver transaminase levels: Secondary | ICD-10-CM

## 2021-09-22 DIAGNOSIS — C7951 Secondary malignant neoplasm of bone: Secondary | ICD-10-CM | POA: Diagnosis not present

## 2021-09-22 LAB — COMPREHENSIVE METABOLIC PANEL
ALT: 106 U/L — ABNORMAL HIGH (ref 0–44)
AST: 123 U/L — ABNORMAL HIGH (ref 15–41)
Albumin: 2.5 g/dL — ABNORMAL LOW (ref 3.5–5.0)
Alkaline Phosphatase: 90 U/L (ref 38–126)
Anion gap: 12 (ref 5–15)
BUN: 29 mg/dL — ABNORMAL HIGH (ref 8–23)
CO2: 25 mmol/L (ref 22–32)
Calcium: 10.4 mg/dL — ABNORMAL HIGH (ref 8.9–10.3)
Chloride: 101 mmol/L (ref 98–111)
Creatinine, Ser: 2.13 mg/dL — ABNORMAL HIGH (ref 0.44–1.00)
GFR, Estimated: 24 mL/min — ABNORMAL LOW (ref >60)
Glucose, Bld: 142 mg/dL — ABNORMAL HIGH (ref 70–99)
Potassium: 3.9 mmol/L (ref 3.5–5.1)
Sodium: 138 mmol/L (ref 135–145)
Total Bilirubin: 0.9 mg/dL (ref 0.3–1.2)
Total Protein: 6.7 g/dL (ref 6.5–8.1)

## 2021-09-22 LAB — CBC
HCT: 29.1 % — ABNORMAL LOW (ref 36.0–46.0)
Hemoglobin: 8.9 g/dL — ABNORMAL LOW (ref 12.0–15.0)
MCH: 28.1 pg (ref 26.0–34.0)
MCHC: 30.6 g/dL (ref 30.0–36.0)
MCV: 91.8 fL (ref 80.0–100.0)
Platelets: 259 10*3/uL (ref 150–400)
RBC: 3.17 MIL/uL — ABNORMAL LOW (ref 3.87–5.11)
RDW: 16.4 % — ABNORMAL HIGH (ref 11.5–15.5)
WBC: 9.2 10*3/uL (ref 4.0–10.5)
nRBC: 0 % (ref 0.0–0.2)

## 2021-09-22 LAB — GLUCOSE, CAPILLARY
Glucose-Capillary: 126 mg/dL — ABNORMAL HIGH (ref 70–99)
Glucose-Capillary: 225 mg/dL — ABNORMAL HIGH (ref 70–99)
Glucose-Capillary: 140 mg/dL — ABNORMAL HIGH (ref 70–99)
Glucose-Capillary: 96 mg/dL (ref 70–99)

## 2021-09-22 MED ORDER — SENNOSIDES-DOCUSATE SODIUM 8.6-50 MG PO TABS
1.0000 | ORAL_TABLET | Freq: Two times a day (BID) | ORAL | Status: DC
  Administered 2021-09-22 – 2021-09-25 (×5): 1 via ORAL
  Filled 2021-09-22 (×6): qty 1

## 2021-09-22 MED ORDER — POLYETHYLENE GLYCOL 3350 17 G PO PACK
17.0000 g | PACK | Freq: Every day | ORAL | Status: DC
Start: 2021-09-22 — End: 2021-09-26
  Administered 2021-09-22 – 2021-09-23 (×2): 17 g via ORAL
  Filled 2021-09-22 (×2): qty 1

## 2021-09-22 MED ORDER — TORSEMIDE 20 MG PO TABS
10.0000 mg | ORAL_TABLET | Freq: Two times a day (BID) | ORAL | Status: DC
  Administered 2021-09-22 – 2021-09-25 (×7): 10 mg via ORAL
  Filled 2021-09-22 (×7): qty 1

## 2021-09-22 MED ORDER — FERROUS SULFATE 325 (65 FE) MG PO TABS
325.0000 mg | ORAL_TABLET | Freq: Two times a day (BID) | ORAL | Status: DC
  Administered 2021-09-22 – 2021-09-25 (×6): 325 mg via ORAL
  Filled 2021-09-22 (×6): qty 1

## 2021-09-22 MED ORDER — IOHEXOL 9 MG/ML PO SOLN
ORAL | Status: AC
  Administered 2021-09-22: 500 mL
  Filled 2021-09-22: qty 1000

## 2021-09-22 NOTE — Care Management Important Message (Signed)
Important Message ? ?Patient Details  ?Name: Stephanie Franco ?MRN: [id number] ?Date of Birth: 1950-10-11 ? ? ?Medicare Important Message Given:  Yes ? ? ? ? ?[name] ?09/22/2021, 3:12 PM ?

## 2021-09-22 NOTE — TOC Initial Note (Signed)
Transition of Care (TOC) - Initial/Assessment Note  ? ? ?Patient Details  ?Name: Stephanie Franco ?MRN: [id number] ?Date of Birth: November 20, 1950 ? ?Transition of Care (TOC) CM/SW Contact:    ?Tom-Johnson, Renea Ee, RN ?Phone Number: ?09/22/2021, 4:35 PM ? ?Clinical Narrative:                 ? ?CM spoke with patient and spouse at bedside about needs for post hospital transition.  ?Admitted for AKI. 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  ?From home with husband,has three adult children. Has a cane and walker.  ?PCP is Lin Landsman, MD [facility] [address]  ?PT recommended home health PT and patient declined, stating she does not want people coming to her home at this time.  ?CM will continue to follow with needs. ? ?Expected Discharge Plan: [facility] ?Barriers to Discharge: Continued Medical Work up ? ? ?Patient Goals and CMS Choice ?Patient states their goals for this hospitalization and ongoing recovery are:: To return home ?CMS Medicare.gov Compare Post Acute Care list provided to:: Patient ?Choice offered to / list presented to : Patient, Spouse ? ?Expected Discharge Plan and Services ?Expected Discharge Plan: [place] ?  ?Discharge Planning Services: CM Consult ?Post Acute Care Choice: Home Health (Declined.) ?Living arrangements for the past 2 months: Apartment ?                ?DME Arranged: N/A ?DME Agency: NA ?  ?  ?  ?HH Arranged: Patient Refused HH ?Van Agency: NA ?  ?  ?  ? ?Prior Living Arrangements/Services ?Living arrangements for the past 2 months: Apartment ?Lives with:: Spouse ?Patient language and need for interpreter reviewed:: Yes ?Do you feel safe going back to the place where you live?: Yes      ?Need for Family Participation in Patient Care: Yes (Comment) ?Care giver support system in place?: Yes (comment) ?Current home services: DME ?Criminal Activity/Legal Involvement Pertinent to Current Situation/Hospitalization: No -  Comment as needed ? ?Activities of Daily Living ?Home Assistive Devices/Equipment: Gilford Rile (specify type), Grab bars in shower ?ADL Screening (condition at time of admission) ?Patient's cognitive ability adequate to safely complete daily activities?: Yes ?Is the patient deaf or have difficulty hearing?: No ?Does the patient have difficulty seeing, even when wearing glasses/contacts?: Yes ?Does the patient have difficulty concentrating, remembering, or making decisions?: No ?Patient able to express need for assistance with ADLs?: Yes ?Does the patient have difficulty dressing or bathing?: No ?Independently performs ADLs?: Yes (appropriate for developmental age) ?Does the patient have difficulty walking or climbing stairs?: Yes ?Weakness of Legs: Both ?Weakness of Arms/Hands: None ? ?Permission Sought/Granted ?Permission sought to share information with : Case Manager, Family Supports ?Permission granted to share information with : Yes, Verbal Permission Granted ?   ?   ?   ?   ? ?Emotional Assessment ?Appearance:: Appears stated age ?Attitude/Demeanor/Rapport: Engaged, Gracious ?Affect (typically observed): Accepting, Appropriate, Calm, Hopeful ?Orientation: : Oriented to Self, Oriented to Place, Oriented to  Time, Oriented to Situation ?Alcohol / Substance Use: Not Applicable ?Psych Involvement: No (comment) ? ?Admission diagnosis:  Hypokalemia [E87.6] ?AKI (acute kidney injury) ([place]) [N17.9] ?Patient Active Problem List  ? Diagnosis Date Noted  ? AKI (acute kidney injury) ([place]) 09/18/2021  ? NSTEMI (non-ST elevated myocardial infarction) ([place]) 06/20/2021  ? Chronic combined systolic and diastolic CHF, NYHA class 2 and ACC/AHA stage C ([place]) 06/2021  ?  Generalized joint pain 11/29/2019  ? Chest pain with moderate risk for cardiac etiology 10/12/2019  ? History of total hip arthroplasty 06/01/2018  ? Itching 05/02/2018  ? Acute on chronic combined systolic and diastolic CHF (congestive heart failure) ([place]) 04/16/2018   ? GERD (gastroesophageal reflux disease) 04/15/2018  ? Gout 04/15/2018  ? Hypomagnesemia 04/15/2018  ? Primary osteoarthritis of right hip 01/19/2018  ? Pulmonary hypertension ([place]) 01/06/2018  ? Moderate mitral regurgitation 12/10/2017  ? Preoperative cardiovascular examination 12/10/2017  ? Osteoarthritis of right hip 06/22/2017  ? Abnormal serum creatinine level 05/31/2017  ? Medication management 11/16/2016  ? Dilated cardiomyopathy ([place]) 01/29/2015  ? Hypokalemia 10/31/2014  ? Pyelonephritis 10/31/2014  ? Abnormal liver function 10/31/2014  ? Chronic kidney disease (CKD), stage III (moderate) (HCC)   ? Cramps of lower extremity 06/24/2013  ? CAD S/P percutaneous coronary angioplasty   ? Permanent atrial fibrillation: CHA2DS2-VASc Score 6   ? Edema of lower extremity   ? Diabetes mellitus ([place]) 07/14/2011  ? Morbid obesity ([place]) 07/12/2011  ? Uterine leiomyoma 07/12/2011  ? Hyperlipidemia associated with type 2 diabetes mellitus ([place]) 07/12/2011  ? Obstructive sleep apnea syndrome 07/12/2011  ? Essential hypertension   ? ?PCP:  Lin Landsman, MD ?Pharmacy:   ?[id number] [place] [place] [address] ?[id number]?Phone: [phone]: [phone]? ?[facility] ?[address]?Phone: [phone]: [phone] ? ? ? ? ?Social Determinants of Health (SDOH) Interventions ?  ? ?Readmission Risk Interventions ?   ? View : No data to display.  ?  ?  ?  ? ? ? ?

## 2021-09-22 NOTE — Progress Notes (Signed)
? ?Subjective: ?[name] is a 71 y.o. female with PMH significant for HFrEF, CAD, permanent atrial fibrillation, breast cancer s/p mastectomy, HTN who was admitted for severe hypokalemia induced rhabdomyolysis. No acute events overnight but she was placed on nasal canula due to an episode of shortness of breath while ambulating to the restroom. Symptoms of shortness of breath improved after being placed on [place]. She continues to urinate but is lower in volume than usual. She denies any fever or chills. She reports generalized weakness that she describes to be different from the weakness she initially presented with. She has good appetite and denies any nausea, vomiting, or abdominal pain.  ? ?Objective: ? ?Vital signs in last 24 hours: ?Vitals:  ? 09/21/21 2115 09/22/21 0448 09/22/21 0544 09/22/21 0953  ?BP:  138/83 109/65 118/79  ?Pulse: (!) 109 (!) 119 (!) 105 100  ?Resp:  '19 18 18  '$ ?Temp:  99.5 ?F (37.5 ?C) 99.3 ?F (37.4 ?C) 98.3 ?F (36.8 ?C)  ?TempSrc:  Oral  Oral  ?SpO2:  96% 100% 100%  ?Weight:      ?Height:      ? ?Weight change: No weight change reported ? ?Intake/Output Summary (Last 24 hours) at 09/22/2021 1458 ?Last data filed at 09/22/2021 0900 ?Gross per 24 hour  ?Intake 460 ml  ?Output 0 ml  ?Net 460 ml  ? ? ?Physical Exam  ?Constitutional: alert and oriented x3. No acute distress. Resting comfortably.  ?Cardiovascular: irregularly irregular. Normal rate. No murmur.  ?Respiratory: On 2L O2 with no increase in work of breathing. No accessory muscle use. Lungs are clear to auscultation bilaterally.  ?Abdominal: Normal bowel sounds. No organomegaly or masses.  ?Extremities: Mild lower extremity edema, more on the right than left. Stable from previous exams.  ?Skin: No obvious lesion. Warm and dry.  ?Psych: normal mood and behavior.  ? ?CBC ?   ?Component Value Date/Time  ? WBC 9.2 09/22/2021 0452  ? RBC 3.17 (L) 09/22/2021 0452  ? HGB 8.9 (L) 09/22/2021 0452  ? HGB 13.1 04/24/2021 0853  ? HCT 29.1  (L) 09/22/2021 0452  ? HCT 40.1 04/24/2021 0853  ? PLT 259 09/22/2021 0452  ? PLT 203 04/24/2021 0853  ? MCV 91.8 09/22/2021 0452  ? MCV 91 04/24/2021 0853  ? MCH 28.1 09/22/2021 0452  ? MCHC 30.6 09/22/2021 0452  ? RDW 16.4 (H) 09/22/2021 0452  ? RDW 15.8 (H) 04/24/2021 0853  ? LYMPHSABS 1.2 09/21/2021 0721  ? MONOABS 0.9 09/21/2021 0721  ? EOSABS 0.1 09/21/2021 0721  ? BASOSABS 0.0 09/21/2021 0721  ? ? ?  Latest Ref Rng & Units 09/22/2021  ?  4:52 AM 09/21/2021  ?  7:21 AM 09/21/2021  ? 12:45 AM  ?CMP  ?Glucose 70 - 99 mg/dL 142   140   119    ?BUN 8 - 23 mg/dL '29   26   27    '$ ?Creatinine 0.44 - 1.00 mg/dL 2.13   2.03   2.10    ?Sodium 135 - 145 mmol/L 138   140   139    ?Potassium 3.5 - 5.1 mmol/L 3.9   3.8   3.9    ?Chloride 98 - 111 mmol/L 101   103   102    ?CO2 22 - 32 mmol/L '25   27   26    '$ ?Calcium 8.9 - 10.3 mg/dL 10.4   10.7   10.4    ?Total Protein 6.5 - 8.1 g/dL 6.7  6.6     ?Total Bilirubin 0.3 - 1.2 mg/dL 0.9   0.7     ?Alkaline Phos 38 - 126 U/L 90   78     ?AST 15 - 41 U/L 123   163     ?ALT 0 - 44 U/L 106   113     ?  ?MR Brain W WO Contrast 09/21/2021 ?IMPRESSION: ?No evidence of metastatic disease to the brain. ?  ?Multiple metastatic bone lesions. The largest is at the lateral ?sphenoid wing on the left. There may be mild dural enhancement ?adjacent to that bony metastasis. ?  ?Normal appearance of the pituitary gland. ?  ?Chronic small-vessel ischemic changes throughout the brain as seen ?previously and outlined above. ? ?MR Cervical Spine W WO Contrast 09/21/2021 ?IMPRESSION: ?Metastatic disease throughout the cervical region as outlined above, ?with most prominent involvement at C3, C6, C7 and T1. T3 is also ?involved but is not well evaluated. No extraosseous tumor causing ?neural compression. No evidence of metastatic disease to the spinal ?cord itself. ?  ?Chronic degenerative changes of the cervical spine with spinal ?stenosis from C4-5 through C6-7. Minimal cord diameter is 6.2 mm at ?C5-6  and C6-7 with some deformity of the cord. ? ?Assessment/Plan: ? ?Principal Problem: ?  AKI (acute kidney injury) ([place]) ? ?Patient is a 71 y.o. female with PMH significant for HFrEF (EF 35%), atrial fibrillation, breast cancer s/p mastectomy, who was admitted for severe hypokalemia induced rhabdomyolysis and was noted to have incidental finding of metastatic disease to the cervical spine on MR Brain.  ? 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 Notably, patient reports history of right breast cancer s/p mastectomy and radiation therapy as well as left breast sarcoma s/p mastectomy in 2008. She is not currently followed by an oncologist. MR Brain and cervical spine done here revealed metastatic disease involving C3, C6, C7, T1, and T3 with no metastases to the brain. Oncology has been consulted and awaiting their recommendation for further work-up. Anticipate discharge to home tomorrow.  ?-Monitor closely with pending oncology recommendation  ? ?Fever, resolved  ?Patient denies any fever, shortness of breath, nausea, vomiting, or other cold-like symptoms. Blood cultures and CBC were unremarkable. Normal WBC count. Temperature remains borderline elevated, which may be related to her metastatic disease.  ?-Monitor temperature closely.  ? ?Severe hypokalemia induced rhabdomyolysis, resolved ?AKI on CKD 3b ?Patient continues to have some shortness of breath as well as generalized weakness which she believes is different from what she initially presented with. Weakness may be multifactorial with differential including deconditioning, secondary to metastatic disease, or residual effects of her severe hypokalemia. Patient's potassium is under good control now with no drop noted on her BMP this morning. Kidney function remains stable.  ?-Restart Torsemide '10mg'$  BID due to episodes of shortness of  breath.  ?-Continue to monitor daily BMP.  ?-K+ supplementation as needed.  ? ?Internal hemorrhoids ?Constipation ?Patient has been experiencing rectal pain for the past x2 weeks. She endorses itching in this area and blood on toilet paper when she wipes but no blood noted in the stool. She endorses constipation and stools tend to be hard. On exam, there is no obvious lesion or external hemorrhoids. Symptoms described are consistent with internal hemorrhoids.   ?-Will provide with Miralax to help with patient's constipation to see if this will help with her symptoms  related to hemorrhoids.  ?-Encouraged good hydration ? ?Transaminitis ?Trending down. No abdominal pain. Continues to have good appetite with no nausea or vomiting.  ?-Closely monitor her hepatic function panel ?-Continue holding rosuvastatin ? ?HFrEF 35% (06/2021)  ?Patient has some increased shortness of breath and is now on 2L of O2. Mild lower extremity edema noted on exam, stable from prior exam.  ?-Will restart low dose of Torsemide 10 mg BID and closely monitor BMP ?-Continue holding [place].  ?-Continue spironolactone 25 mg QD ? ?Permanent atrial fibrillation ?Chronic and stable. Anticoagulated on Eliquis.  ?-Continue Eliquis 5 mg BID ? ?HTN ?BP remains stable ?-Continue BP control with Imdur ? ?T2DM ?Currently holding Farxiga due to hypokalemia and AKI. Last blood glucose was 225.  ?-Continue Semglee and SSI ? ?HLD ?Currently holding rosuvastatin due to transaminitis ?-Continue Zetia ? ? LOS: 3 days  ? ?Clydell Hakim, Medical Student ?09/22/2021, 2:58 PM ? ?Pager number: [phone] ? ? ?Attestation for Student Documentation: ? ?I personally was present and performed or re-performed the history, physical exam and medical decision-making activities of this service and have verified that the service and findings are accurately documented in the student?s note. ? ?Lajean Manes, MD ?09/22/2021, 3:46 PM ? ?

## 2021-09-22 NOTE — Consult Note (Addendum)
[facility]  ?[phone] [phone]  ? ?MEDICAL ONCOLOGY - INITIAL CONSULTATION ? ?Referral MD: Dr. Gilles Chiquito ? ?Reason for Referral: Bone lesions ? ?HPI: Stephanie Franco is a 71 year old female with a past medical history significant for permanent atrial fibrillation on Eliquis, CAD with prior PCI, HFrEF 35% (06/2021), hypertension, hyperlipidemia, type 2 diabetes mellitus, and history of left breast cancer.  She presented to the emergency department with dark urine and generalized weakness.  She had been experiencing dark urine, lack of appetite, and generalized weakness x2 days.  In the emergency department, lab work showed a creatinine of 2.94 (baseline 1.6-2), potassium of 2, AST 221, ALT 125, alk phos 83, lipase 37, magnesium 1.9.  Due to the elevated liver enzymes, she had an ultrasound of the abdomen which showed cholelithiasis and no evidence for acute cholecystitis.  Renal ultrasound was negative.  Based on her lab work, there was concern for pituitary adenoma and MRI of the brain without contrast was performed on 09/19/2021 which showed no obvious intracranial metastatic disease or pituitary mass, multiple areas of abnormal signal within the visualized cervical spine, skull base, and calvarium suspicious for metastatic disease.  An MRI of the brain with and without contrast was performed 09/21/2021 which again showed no metastatic disease in the brain but did show multiple metastatic bone lesions-largest is at the lateral sphenoid wing on the left.  An MRI of the cervical spine was also performed on 09/21/2021 which showed metastatic disease throughout the cervical region with most prominent involvement at C3, C6, C7, and T1.  T3 was also involved but not well evaluated.  There is no extraosseous tumor causing neural compression and no evidence of metastatic disease to the spinal cord itself. ? ?With regards to the patient's breast cancer history, she was previously treated in  [place].  She had her initial breast biopsy performed on 11/04/2021 which showed invasive ductal carcinoma and DCIS with intermediate to high-grade microcalcifications of the left breast.  The tumor was ER positive at 100% and PR positive at 43%.  HER2 was 0.3 and Ki-67 was 55%.  She underwent a lumpectomy of the left breast on 12/16/2021 and 5 lymph nodes were removed which were negative for malignancy.  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  The patient tells me that she received radiation following her lumpectomy followed by 5 years of tamoxifen.  She stated that she did not receive any systemic chemotherapy.  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  There was extensive involvement of the surgical resection margins.   ? ?Today, the patient reports that she has neck pain.  She is not having any low back pain or hip pain.  She reports that her appetite has been decreased and she has lost weight recently.  She has been having headaches which have now resolved.  She has no dizziness.  Denies chest pain but does have some shortness of breath.  He thinks her shortness of breath is due to some fluid overload.  Denies abdominal pain, nausea, vomiting.  However, she does have some dry heaving recently which has now resolved.  Denies lower extremity edema and bleeding.  The patient is married and had 4 children-1 deceased from heart disease.  Has a remote history of smoking cigarettes (less than 1 pack/day) for about 10 years.  Drinks alcohol on occasion.  Family history significant for a sister  with lung cancer.  Medical oncology was asked to see the patient make recommendations regarding her bone lesions. ? ? ?Past Medical History:  ?Diagnosis Date  ? Aortic atherosclerosis ([place])   ? Arthritis   ? Asthma   ? Atrial fibrillation, permanent ([place])   ? Rate control with Bystolic. CHA2DS2Vasc = 6 (HTN, DM, CHF, Age 71, Female) -> on Pradaxa  ? Breast cancer ([place]) 2008  ? S/P mastectomy  ? CAD S/P percutaneous coronary angioplasty 2006  ? PCI of circumflex with Taxus DES;; relook-cath Feb 2013: 50-60% short lesion in RCA, 40% ISR Circumflex stent.;  06/2021: Patent LCx stent.  Distal LCx 90% stenosis, proximal-mid RCA 60%.  ? CHF (congestive heart failure), NYHA class II, chronic, combined ([place])   ? CKD (chronic kidney disease), stage III ([place])   ? Complication of anesthesia   ? prolonged sedation after colonoscopy in [place]  ? Diabetes mellitus, uncontrolled 07/14/2011  ? Dilated cardiomyopathy ([place]) 06/2021  ? 2019: EF down to 25% improved to 50 to 55% x 2020. => Recurrent cardiomyopathy 06/2021 => EF 25 to 30% on Echo, CMR showed severe BiV failure (LVEF 36%, diffuse HK; RVEF 32%).  Severe biatrial enlargement.  Moderate MR posterior directed.  Suspect functional (suggested by TEE); noncoronary pattern for delayed gadolinium-suggest myocarditis, & prior Ant MI-subendocardial => Mixed cardiomyopathy  ? Edema of both legs   ? Usually mild, chronic. Controlled with when necessary furosemide and diet  ? 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 "have that now"  ? GERD (gastroesophageal reflux disease)   ? Gout   ? Hyperlipidemia   ? Hypertension   ? Hypokalemia   ? Morbid obesity ([place])   ? BMI 41  ? OSA on CPAP   ? On CPAP  ? Pulmonary hypertension, unspecified ([place])   ? PAP ~90 mmHg on Echo 12/2017 -- has OSA on CPAP & obesity -> follow-up sleep study showed mild OSA.;  09/22/2021: RHC showed PAP of 86/30 mmHg with LVEDP of 14 mmHg.  ? Systolic murmur   ?: ? ? ?Past Surgical History:  ?Procedure Laterality Date  ? Cardiac MRI   06/24/2021  ? Severe biventricular failure: LVEF 36%-diffuse HK. RV EF 32%. Severe BiA Enlargement. Mod MR. LGE in septum - non-coronary pattern -? Myocarditis. Anterior wall LGE is subendocardial - ? Prior MI.  ? COLONOSCOPY    ? CORONARY ANGIOPLASTY WITH STENT PLACEMENT  2006  ? stent to circumflex  ? DILATION AND CURETTAGE OF UTERUS    ? LEFT HEART CATHETERIZATION WITH CORONARY ANGIOGRAM N/A 07/13/2011  ? Procedure: LEFT HEART CATHETERIZATION WITH CORONARY ANGIOGRAM;  Surgeon: Leonie Man, MD;  Location: [facility] CATH LAB;  Service: Cardiovascular;  normal EF; 72m 50-60% lesion in RCA; patent stent in circumflex form 2006 with ~40% in-stent re-stenosis  ? MASTECTOMY  2008  ? left  ? NM MYOVIEW LTD  7/'16; 6/'21  ? LOW RISK. No ischemia or infarction. Not gated 2/2  A. fib - unable to assess EF  ? RIGHT/LEFT HEART CATH AND CORONARY ANGIOGRAPHY N/A 06/23/2021  ? Procedure: RIGHT/LEFT HEART CATH AND CORONARY ANGIOGRAPHY;  Surgeon: BLorretta Harp MD;  [facility];  Service: Cardiovascular; p-mRCA 60%.  Distal LCx  90%. mLAD stent patent.  RAP 22 RVP 70/1 mmHg with PAP 80/30 mmHg. LVEDP 14 mmHg-unable to assess PCWP.  ? TEE WITHOUT CARDIOVERSION N/A 06/25/2021  ? Procedure: TRANSESOPHAGEAL ECHOCARDIOGRAM (TEE);  Surgeon: Larey Dresser, MD;  Location[facility] ENDOSCOPY; EF 35 to 40% with global HK.  Mildly reduced RV function.  Severely dilated left atrium with no LAA thrombus.  ?  Mild RA dilation.  Posterior MVL restriction w/ moderate eccentric/posterior directed MR.  No PV reversal. ~ functional MR 2/2 Afib.  Mild dilation of the AscAo (~ 40 mm)  ? TOTAL HIP ARTHROPLASTY Right 01/19/2018  ? Procedure: RIGHT TOTAL HIP ARTHROPLASTY ANTERIOR APPROACH;  Surgeon: Rod Can, MD;  Location: [facility] ORS;  Service: Orthopedics;  Laterality: Right;  Needs RNFA  ? TRANSTHORACIC ECHOCARDIOGRAM  6/'15; 9/'16  ? a) In setting of Urosepsis: EF ~25 %, global HK (poor quality study);; b) LV EF 40-45%, Mild  Anteroseptal HK. Mod-Severe  RA dilation with elevated PA Pressures (~~peak 63 mmHg).. Mild LA dilation  ? TRANSTHORACIC ECHOCARDIOGRAM  12/2017; 04/15/2018  ? a) Improved LVEF to 45 to 50%.  Pulmonary HTN ~PAP

## 2021-09-23 ENCOUNTER — Inpatient Hospital Stay (HOSPITAL_COMMUNITY): Payer: Medicare Other

## 2021-09-23 ENCOUNTER — Other Ambulatory Visit: Payer: Self-pay | Admitting: *Deleted

## 2021-09-23 DIAGNOSIS — Z853 Personal history of malignant neoplasm of breast: Secondary | ICD-10-CM

## 2021-09-23 DIAGNOSIS — N179 Acute kidney failure, unspecified: Secondary | ICD-10-CM | POA: Diagnosis not present

## 2021-09-23 DIAGNOSIS — D649 Anemia, unspecified: Secondary | ICD-10-CM | POA: Diagnosis not present

## 2021-09-23 DIAGNOSIS — C493 Malignant neoplasm of connective and soft tissue of thorax: Secondary | ICD-10-CM | POA: Diagnosis not present

## 2021-09-23 DIAGNOSIS — C7951 Secondary malignant neoplasm of bone: Secondary | ICD-10-CM | POA: Diagnosis not present

## 2021-09-23 LAB — GLUCOSE, CAPILLARY
Glucose-Capillary: 111 mg/dL — ABNORMAL HIGH (ref 70–99)
Glucose-Capillary: 115 mg/dL — ABNORMAL HIGH (ref 70–99)
Glucose-Capillary: 190 mg/dL — ABNORMAL HIGH (ref 70–99)
Glucose-Capillary: 97 mg/dL (ref 70–99)

## 2021-09-23 LAB — COMPREHENSIVE METABOLIC PANEL
ALT: 92 U/L — ABNORMAL HIGH (ref 0–44)
AST: 84 U/L — ABNORMAL HIGH (ref 15–41)
Albumin: 2.3 g/dL — ABNORMAL LOW (ref 3.5–5.0)
Alkaline Phosphatase: 90 U/L (ref 38–126)
Anion gap: 9 (ref 5–15)
BUN: 31 mg/dL — ABNORMAL HIGH (ref 8–23)
CO2: 28 mmol/L (ref 22–32)
Calcium: 10 mg/dL (ref 8.9–10.3)
Chloride: 100 mmol/L (ref 98–111)
Creatinine, Ser: 2.04 mg/dL — ABNORMAL HIGH (ref 0.44–1.00)
GFR, Estimated: 25 mL/min — ABNORMAL LOW (ref >60)
Glucose, Bld: 126 mg/dL — ABNORMAL HIGH (ref 70–99)
Potassium: 3.4 mmol/L — ABNORMAL LOW (ref 3.5–5.1)
Sodium: 137 mmol/L (ref 135–145)
Total Bilirubin: 0.5 mg/dL (ref 0.3–1.2)
Total Protein: 6.4 g/dL — ABNORMAL LOW (ref 6.5–8.1)

## 2021-09-23 LAB — T3: T3, Total: 58 ng/dL — ABNORMAL LOW (ref 71–180)

## 2021-09-23 MED ORDER — POTASSIUM CHLORIDE CRYS ER 20 MEQ PO TBCR
40.0000 meq | EXTENDED_RELEASE_TABLET | Freq: Two times a day (BID) | ORAL | Status: DC
  Administered 2021-09-24 – 2021-09-26 (×4): 40 meq via ORAL
  Filled 2021-09-23 (×4): qty 2

## 2021-09-23 MED ORDER — APIXABAN 5 MG PO TABS
5.0000 mg | ORAL_TABLET | Freq: Two times a day (BID) | ORAL | Status: DC
  Filled 2021-09-23: qty 1

## 2021-09-23 MED ORDER — POTASSIUM CHLORIDE CRYS ER 20 MEQ PO TBCR
60.0000 meq | EXTENDED_RELEASE_TABLET | Freq: Two times a day (BID) | ORAL | Status: AC
  Administered 2021-09-23 (×2): 60 meq via ORAL
  Filled 2021-09-23 (×2): qty 3

## 2021-09-23 MED ORDER — TECHNETIUM TC 99M MEDRONATE IV KIT
20.0000 | PACK | Freq: Once | INTRAVENOUS | Status: AC | PRN
  Administered 2021-09-23: 21.1 via INTRAVENOUS

## 2021-09-23 NOTE — Plan of Care (Signed)
?  Problem: Activity: Goal: Risk for activity intolerance will decrease Outcome: Progressing   Problem: Nutrition: Goal: Adequate nutrition will be maintained Outcome: Progressing   Problem: Coping: Goal: Level of anxiety will decrease Outcome: Progressing   Problem: Elimination: Goal: Will not experience complications related to urinary retention Outcome: Progressing   

## 2021-09-23 NOTE — Progress Notes (Signed)
Scheduling message sent for lab/OV week of 5/22 with Dr. Benay Spice or NP. Lab orders placed for CBC,CMP ?

## 2021-09-23 NOTE — Progress Notes (Signed)
? ?Subjective: ?[name] is a 71 y.o. with PMH significant for breast cancer s/p left breast mastectomy, HFrEF 35%, CAD, permanent atrial fibrillation, HTN who was admitted for severe hypokalemia induced rhabdomyolysis. No acute events overnight with improvements in her breathing and is no longer on nasal canula. She reports episodes of fever and chills overnight however is afebrile. She has good appetite and denies any nausea, vomiting, or abdominal pain.  ? ?She is updated on the plan for today including bone scan, biopsy, and discharge to home once these things are complete.  ? ? ?Objective: ? ?Vital signs in last 24 hours: ?Vitals:  ? 09/22/21 1701 09/22/21 2302 09/23/21 0100 09/23/21 0914  ?BP: (!) 111/96 112/71  105/64  ?Pulse: 100 (!) 106  90  ?Resp: '18 18  16  '$ ?Temp: 98.4 ?F (36.9 ?C) 100 ?F (37.8 ?C) 98.7 ?F (37.1 ?C) 98.5 ?F (36.9 ?C)  ?TempSrc:  Oral Oral Oral  ?SpO2: 93% 96%  94%  ?Weight:      ?Height:      ? ?Weight change:  ? ?Intake/Output Summary (Last 24 hours) at 09/23/2021 1320 ?Last data filed at 09/23/2021 0841 ?Gross per 24 hour  ?Intake 920 ml  ?Output 1900 ml  ?Net -980 ml  ? ?Physical Exam  ?Constitutional: alert and oriented x3. Resting comfortably and in no acute distress.  ?Cardiovascular: Irregularly irregular. Normal rate. No murmurs, rubs, or gallop noted on exam.  ?Respiratory: Normal respiratory effort. Lungs are clear to auscultation bilaterally. No accessory muscle use.  ?Abdominal: Normal bowel sounds. Non-distended. No TTP.  ?Extremities: 1+ pitting edema on the right lower extremity, none on the left, stable from prior exam ?Skin: Warm and dry ?Psych: normal mood and behavior ? ? ?Assessment/Plan: ? ?Principal Problem: ?  AKI (acute kidney injury) ([place]) ? 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 She has been evaluated by oncology. CT chest/abdomen/pelvis showed diffuse lytic and sclerotic lesions in the bone. Corrected calcium of 13; suspect this is from bony mets from underlying metastatic malignancy. Bone scan, CA 27.29, and IR guided biopsy to identify primary and determine course of tx currently pending.  ?Cleared for D/C from oncology standpoint once biopsy complete; spoke with IR APP-- biopsy will be scheduled for tmrw as they are backed up today. ?-Continue to monitor closely while patient receives work-up.  ?-Patient to follow-up with oncology as an outpatient for further treatment  ?-Tylenol prn  ?-Oxycodone IR 10 mg TID prn for severe pain - bowel regimen in place  ?-Follow up on PTH related peptide ?-Ferrous sulfate 325 mg BID  ? ?Severe hypokalemia induced rhabdomyolysis, resolved  ?AKI on CKD 3b, resolved  ?Patient's shortness of breath and weakness have improved. She continues to have good appetite without nausea or vomiting. Torsemide restarted yesterday at reduced dose of 10 mg BID. Mild decrease in potassium down to 3.4. Kidney function remains stable. Suspect initial hypokalemia was d/t torsemide use, however hyperaldosteronism remains in question, pending renin/aldo ratio.  ?-Replete K+ 60 mEq BID.  ?-Continue Torsemide 10 mg BID.  ?-Continue to monitor daily BMP.  ?-Follow up on renin/aldosteronism ratio  ? ?HFrEF  35% (06/2021) ?Shortness of breath has improved and is no longer on nasal canula. O2 sat stable. Mild lower extremity edema, stable from prior exam. Started back on Torsemide 10 mg BID yesterday.  ?-Continue Torsemide 10 mg BID along with K supplementation  ?-Continue spironolactone 25 mg QD ?-Continue holding [place].  ? ?Constipation ?Continues to struggle with having  a bowel movement in the setting of chronic opioid use. No abdominal pain, nausea, or vomiting.  ?-Continue Miralax and senokot  ? ?Transaminitis, resolving  ?LFTs continue to improve. No abdominal pain and patient has been tolerating PO intake.  ?-Closely monitor liver function panel ?-Continue holding rosuvastatin ? ?Permanent atrial fibrillation ?Chronic and stable. Anticoagulated on Eliquis.  ?-Continue Eliquis 5 mg BID ? ?HTN ?BP remains stable ?-Continue BP control with Imdur ? ?T2DM ?CBG's improved to 111, down from 225 yesterday. Continue holding Farxiga in the setting on hypokalemia and AKI.  ?-Continue Semglee and SSI ? ?HLD ?Currently holding rosuvastatin due to transaminitis ?-Continue Zetia ? ? ? LOS: 4 days  ? ?Stephanie Franco, Medical Student ?09/23/2021 ?Pager number: [phone] ? ? ?Attestation for Student Documentation: ? ?I personally was present and performed or re-performed the history, physical exam and medical decision-making activities of this service and have verified that the service and findings are accurately documented in the student?s note. ? ?Stephanie Manes, MD ?09/23/2021, 1:44 PM ?

## 2021-09-23 NOTE — Progress Notes (Incomplete)
Hold eliquis tonight per Dean,MD. Patient for Bone biopsy tomorrow. ?

## 2021-09-23 NOTE — Progress Notes (Signed)
Mobility Specialist Progress Note  ? ? 09/23/21 1504  ?Mobility  ?Activity Off unit  ? ?Will f/u as schedule permits.  ? ?Hildred Alamin ?Mobility Specialist  ?Primary: 5N M.S. Phone: [phone] ?Secondary: 6N M.S. Phone: [phone] ?  ?

## 2021-09-23 NOTE — Discharge Summary (Incomplete)
? ?Name: Stephanie Franco ?MRN: [id number] ?DOB: Jan 13, 1951 71 y.o. ?PCP: Lin Landsman, MD ? ?Date of Admission: 09/18/2021  1:26 PM ?Date of Discharge:  09/23/21 ?Attending Physician: Dr. Daryll Drown ? ?DISCHARGE DIAGNOSIS:  ?Primary Problem: AKI (acute kidney injury) ([place])  ? ?Hospital Problems: ?Principal Problem: ?  AKI (acute kidney injury) ([place]) ? Rhabdomyolysis  ?Hypokalemia  ?Metastatic disease  ? ?DISCHARGE MEDICATIONS:  ? ?Allergies as of 09/23/2021   ?No Known Allergies ?  ?Med Rec must be completed prior to using this [place]*** ? ? ? ? ? ? ?DISPOSITION AND FOLLOW-UP:  ?Ms.Stephanie Franco was discharged from [facility] in stable condition. At the hospital follow up visit please address: ? ?Follow-up Recommendations: ?Consults: [place] follow up for metastatic disease  ?Labs: CMP, CBC  ?Studies: none  ?Medications: *** ? ?Follow-up Appointments: ? Follow-up Information   ? ? Lin Landsman, MD. Call today.   ?Specialty: Family Medicine ?Why: Call your PCP to schedule a 1 week post hospital follow up appointment. ?Contact information: ?[address] ?[place] [address] ?[phone] ? ? ?  ?  ? ? [place]. Schedule an appointment as soon as possible for a visit in 1 week(s).   ?Why: Call the [facility] in 1 week at [facility] at Telephone number [phone]) [phone] if you do not hear from someone at thier office. They are working on scheduling you an appointment, but please cal them if you do not hear from them within in 1 week of discharge! ? ?  ?  ? ?  ?  ? ?  ? ? ?HOSPITAL COURSE:  ?Patient Summary: ?Severe hypokalemia induced rhabdomyolysis ?AKI on CKD 3b ?Patient initially presented for dark urine and generalized weakness and was found to have hypokalemia, down to 2. CK levels were also elevated at over 4000. She was given gentle fluids in the setting of her heart failure and her potassium was repleted during her hospital admission. Etiology of  her potassium was unclear but thought to be medicine induced. Her Flarxiga, torsemide, allopurinol, and losartan were held. Potassium levels improved throughout hospital course. She was restarted on a lower dose of her Torsemide for diuresis given her heart failure. Continue on spironolactone. Patient was instructed to follow-up closely with her cardiologist to optimize her medications.  ? ?Metastatic disease ?Patient had an MRI of the brain to evaluate for pituitary adenoma due to her abnormal thyroid function test. Incidental finding of metastatic disease to the cervical spine. She has history of breast cancer s/p left breast mastectomy in 2008; no history of chemotherapy or radiation. Oncology consult saw her at bedside and recommended further imaging study with CT abdomen/pelvis as well as bone scan.  ? ?Elevated thyroid function test ?Patient's thyroid function test was abnormal with elevated TSH and T4 levels. She has history of elevated TSH levels however she was never diagnosed or treated for this. MRI was negative for pituitary adenoma.  ? ?Transaminitis ?Hepatic function panel showed AST elevated at 221 and ALT 125. Levels continued to improved throughout the hospital course. Transaminitis thought to be secondary to rhabdomyolysis induced by severe hypokalemia. We held her Rosuvastatin during her admission.  ? ?HFrEF ?She has history of HFrEF with EF down to 35% in 06/2021.  Her Torsemide and [place] were held during her admission and was given gentle fluids. She did develop some shortness of breath with ambulation requiring O2 nasal canula. She was restarted on her Torsemide 10  mg BID for diuresis. Recommend close follow-up with her cardiologist to optimize her medication regimen for her heart failure.  ? ?Permanent atrial fibrillation  ?Chronic and stable and remained anticoagulated on Eliquis.  ? ?HTN ?Blood pressure remained stable on Imdur during her hospital course.  ? ?T2DM ?Patient's [place]ade was  held during her admission due to her hypokalemia and AKI. Blood glucose was under good control on Semglee.  ? ?HLD ?Patient's Rosuvastatin was held due to her transaminitis. She remained on her Zetia during this admission. Transaminitis improved throughout hospitalization. Rosuvastatin was restarted at time of discharge.  ? ?Gout ?Her allopurinol was held due to her AKI. No acute gout flare-ups during her hospital stay.  ?  ? ?DISCHARGE INSTRUCTIONS:  ? ? ?SUBJECTIVE:  ?No acute overnight events. Patient was seen at bedside during rounds this morning. Pt reports feeling well this morning. Pt complains of some restlessness. Pt denies any pain or other acute concerns. .  ? ?No other complains or concerns at this time.  ? ?All questions were addressed with patient prior to being discharged.  ? ?Discharge Vitals:   ?BP 112/71   Pulse (!) 106   Temp 98.7 ?F (37.1 ?C) (Oral)   Resp 18   Ht '5\' 3"'$  (1.6 m)   Wt 82.9 kg   SpO2 96%   BMI 32.37 kg/m?  ? ?OBJECTIVE:  ?Constitutional: alert, well-appearing, in NAD ?HENT: normocephalic, atraumatic, mucous membranes moist ?Eyes: conjunctiva non-erythematous, EOMI ?Neck: supple, no JVD ?Cardiovascular: RRR, no m/r/g, non-edematous bilateral LE ?Pulmonary/Chest: normal work of breathing on room air, LCTAB ?Abdominal: soft, non-tender to palpation, non-distended ?MSK: normal bulk and tone ?Neurological: A&O x 3, follows commands  ?Skin: warm and dry ?Psych: normal behavior, normal affect *** ? ?Pertinent Labs, Studies, and Procedures:  ? ?  Latest Ref Rng & Units 09/22/2021  ?  4:52 AM 09/21/2021  ?  7:21 AM 09/19/2021  ?  4:27 AM  ?CBC  ?WBC 4.0 - 10.5 K/uL 9.2   10.4   10.0    ?Hemoglobin 12.0 - 15.0 g/dL 8.9   9.0   9.1    ?Hematocrit 36.0 - 46.0 % 29.1   29.8   29.7    ?Platelets 150 - 400 K/uL 259   269   271    ? ? ? ?  Latest Ref Rng & Units 09/23/2021  ?  5:36 AM 09/22/2021  ?  4:52 AM 09/21/2021  ?  7:21 AM  ?CMP  ?Glucose 70 - 99 mg/dL 126   142   140    ?BUN 8 - 23 mg/dL '31   29    26    '$ ?Creatinine 0.44 - 1.00 mg/dL 2.04   2.13   2.03    ?Sodium 135 - 145 mmol/L 137   138   140    ?Potassium 3.5 - 5.1 mmol/L 3.4   3.9   3.8    ?Chloride 98 - 111 mmol/L 100   101   103    ?CO2 22 - 32 mmol/L '28   25   27    '$ ?Calcium 8.9 - 10.3 mg/dL 10.0   10.4   10.7    ?Total Protein 6.5 - 8.1 g/dL 6.4   6.7   6.6    ?Total Bilirubin 0.3 - 1.2 mg/dL 0.5   0.9   0.7    ?Alkaline Phos 38 - 126 U/L 90   90   78    ?AST 15 - 41 U/L  84   123   163    ?ALT 0 - 44 U/L 92   106   113    ? ? ?MR BRAIN WO CONTRAST ? ?Result Date: 09/19/2021 ?CLINICAL DATA:  Brain/CNS neoplasm, staging assess for pituitary adenoma EXAM: MRI HEAD WITHOUT CONTRAST TECHNIQUE: Multiplanar, multiecho pulse sequences of the brain and surrounding structures were obtained without intravenous contrast. COMPARISON:  None. FINDINGS: Brain: No acute infarction, hemorrhage, hydrocephalus, extra-axial collection or mass lesion. Moderate scattered T2/FLAIR hyperintensities in the white matter, nonspecific but compatible with chronic microvascular ischemic disease. The pituitary gland is unremarkable in size without obvious mass lesion; however, evaluation is limited on this noncontrast, non pituitary protocol study. Vascular: Major arterial flow voids are maintained skull base. Skull and upper cervical spine: Multiple areas of abnormal signal within the visualized cervical spine, skull base, and calvarium, suspicious for osseous metastatic disease. Sinuses/Orbits: Mild paranasal sinus mucosal thickening. Other: Small left mastoid effusion. IMPRESSION: 1. No obvious intracranial metastatic disease or pituitary mass; however, malignancy is not excluded on this noncontrast study. Recommend postcontrast whole brain imaging and pituitary protocol. 2. Moderate chronic microvascular ischemic disease. 3. Multiple areas of abnormal signal within the visualized cervical spine, skull base, and calvarium, suspicious for osseous metastatic disease. The  recommended postcontrast imaging can better assess. Electronically Signed   By: Margaretha Sheffield M.D.   On: 09/19/2021 15:50  ? ?US RENAL ? ?Result Date: 09/18/2021 ?CLINICAL DATA:  Acute kidney injury EXAM: RENAL / URIN

## 2021-09-23 NOTE — Progress Notes (Addendum)
HEMATOLOGY-ONCOLOGY PROGRESS NOTE ? ?ASSESSMENT AND PLAN: ?1.  Bone lesions of the cervical spine ?-MRI brain with and without contrast 09/21/2021-multiple metastatic bone lesions, largest at the lateral sphenoid wing on the left ?-MRI of the cervical spine with and without contrast 09/21/2021-metastatic disease throughout the cervical region most prominent at C3, C6, C7, and T1.  T3 also involved but not well evaluated. ?-CT chest/abdomen/pelvis without contrast 09/22/2021-diffuse lytic and sclerotic lesions in the bones compatible with metastatic disease, mild compression deformity in the inferior endplate of Q11 which is indeterminate in age, indeterminate hypodensity in the posterior right lobe of the liver measuring 1.2 cm. ?2.  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  Fibrosarcoma of the left breast ?-Status post left breast mastectomy, positive surgical margins ?4.  Normocytic anemia ?5.  Acute on chronic kidney disease ?6.  Hypercalcemia ?7.  Transaminitis ?8.  Elevated TSH and free T4 ?9.  HFrEF 35% diagnosed February 2023 ?10.  Permanent atrial fibrillation ?11.  Hypertension ?12.  CAD with prior PCI ?13.  Diabetes mellitus ?14.  Hyperlipidemia ?15.  Uterine fibroids ? ?Stephanie Franco appears stable.  She had a CT of the chest/abdomen/pelvis performed yesterday which showed diffuse lytic and sclerotic lesions in her bones.  There was an indeterminate hypodensity in the right lobe of the liver which may represent a cyst.  Results were discussed with the patient today.  Findings concerning for malignancy and may be recurrent breast versus new primary.  Recommend biopsy of one of her lytic lesions in the pelvic area.  Consult has been placed for IR to evaluate and consider for biopsy.  I  have held her morning dose of Eliquis pending evaluation by IR.  Hopefully they can perform the biopsy today. ? ?She has a bone scan which is pending today.  A CA 27.29 has been drawn and these results are also pending.  Her hypercalcemia has improved this morning.  Will hold off on bisphosphonate therapy at this time. ? ?Recommendations: ?1.  Await results of bone scan. ?2.  CT-guided biopsy of one of her lytic lesions in the pelvis. ?3.  Follow-up on results of CA 27.29. ?4.  Okay to discharge the patient from our standpoint once the bone scan and biopsy have been performed if otherwise medically stable per primary team.  We will arrange for outpatient follow-up at the cancer center. ? ?Mikey Bussing, DNP, AGPCNP-BC, AOCNP ? ?Stephanie Franco was interviewed and examined.  I reviewed the CT findings with Stephanie Franco and her husband.  I reviewed the CT images.  She has diffuse sclerotic/lytic bone lesions consistent with metastatic disease.  The most likely diagnosis is metastatic breast cancer.  Several of the pelvic lesions have a significant lytic component and are likely amenable to CT-guided biopsy. ? ?I was present for greater than 50% today's visit.  I performed medical decision making. ? ? ?SUBJECTIVE:  No new complaints.  ? ?PHYSICAL EXAMINATION: ? ?Vitals:  ? 09/22/21 2302 09/23/21 0100  ?BP: 112/71   ?Pulse: (!) 106   ?Resp: 18   ?Temp: 100 ?F (37.8 ?C) 98.7 ?F (37.1 ?C)  ?SpO2: 96%   ? ?Filed Weights  ? 09/19/21 1752 09/20/21 9417  ?Weight: 81.1 kg 82.9 kg  ? ? ?Intake/Output from previous day: ?05/08 0701 - 05/09  0700 ?In: 1280 [P.O.:1280] ?Out: 1400 [Urine:1400] ? ?Physical Exam ?Vitals reviewed.  ?Constitutional:   ?   General: She is not in acute distress. ?Eyes:  ?   General: No scleral icterus. ?   Conjunctiva/sclera: Conjunctivae normal.  ?Cardiovascular:  ?   Rate and Rhythm: Rhythm irregular.  ?Pulmonary:  ?   Effort: No respiratory distress.  ?   Breath sounds: Normal breath sounds.  ?Abdominal:  ?    Palpations: Abdomen is soft.  ?Skin: ?   General: Skin is warm and dry.  ?Neurological:  ?   Mental Status: She is alert and oriented to person, place, and time.  ? ? ?LABORATORY DATA:  ?I have reviewed the data as listed ? ?  Latest Ref Rng & Units 09/23/2021  ?  5:36 AM 09/22/2021  ?  4:52 AM 09/21/2021  ?  7:21 AM  ?CMP  ?Glucose 70 - 99 mg/dL 126   142   140    ?BUN 8 - 23 mg/dL _0 ?Creatinine 0.44 - 1.00 mg/dL 2.04   2.13   2.03    ?Sodium 135 - 145 mmol/L 137   138   140    ?Potassium 3.5 - 5.1 mmol/L 3.4   3.9   3.8    ?Chloride 98 - 111 mmol/L 100   101   103    ?CO2 22 - 32 mmol/L _1 ?Calcium 8.9 - 10.3 mg/dL 10.0   10.4   10.7    ?Total Protein 6.5 - 8.1 g/dL 6.4   6.7   6.6    ?Total Bilirubin 0.3 - 1.2 mg/dL 0.5   0.9   0.7    ?Alkaline Phos 38 - 126 U/L 90   90   78    ?AST 15 - 41 U/L 84   123   163    ?ALT 0 - 44 U/L 92   106   113    ? ? ?Lab Results  ?Component Value Date  ? WBC 9.2 09/22/2021  ? HGB 8.9 (L) 09/22/2021  ? HCT 29.1 (L) 09/22/2021  ? MCV 91.8 09/22/2021  ? PLT 259 09/22/2021  ? NEUTROABS 8.1 (H) 09/21/2021  ? ? ?No results found for: CEA1, CEA, [id number], CA125, PSA1 ? ?MR BRAIN WO CONTRAST ? ?Result Date: 09/19/2021 ?CLINICAL DATA:  Brain/CNS neoplasm, staging assess for pituitary adenoma EXAM: MRI HEAD WITHOUT CONTRAST TECHNIQUE: Multiplanar, multiecho pulse sequences of the brain and surrounding structures were obtained without intravenous contrast. COMPARISON:  None. FINDINGS: Brain: No acute infarction, hemorrhage, hydrocephalus, extra-axial collection or mass lesion. Moderate scattered T2/FLAIR hyperintensities in the white matter, nonspecific but compatible with chronic microvascular ischemic disease. The pituitary gland is unremarkable in size without obvious mass lesion; however, evaluation is limited on this noncontrast, non pituitary protocol study. Vascular: Major arterial flow voids are maintained skull base. Skull and upper cervical spine: Multiple areas  of abnormal signal within the visualized cervical spine, skull base, and calvarium, suspicious for osseous metastatic disease. Sinuses/Orbits: Mild paranasal sinus mucosal thickening. Other: Small left mastoid effusion. IMPRESSION: 1. No obvious intracranial metastatic disease or pituitary mass; however, malignancy is not excluded on this noncontrast study. Recommend postcontrast whole brain imaging and pituitary protocol. 2. Moderate chronic microvascular ischemic disease. 3. Multiple areas of abnormal signal within the visualized cervical spine, skull base, and calvarium, suspicious for osseous metastatic disease. The recommended postcontrast imaging can  better assess. Electronically Signed   By: Margaretha Sheffield M.D.   On: 09/19/2021 15:50  ? ?MR BRAIN W WO CONTRAST ? ?Result Date: 09/21/2021 ?CLINICAL DATA:  Brain metastases, unknown primary. EXAM: MRI HEAD WITHOUT AND WITH CONTRAST TECHNIQUE: Multiplanar, multiecho pulse sequences of the brain and surrounding structures were obtained without and with intravenous contrast. CONTRAST:  8.31m GADAVIST GADOBUTROL 1 MMOL/ML IV SOLN COMPARISON:  Prior noncontrast exam 09/19/2021 FINDINGS: Brain: Diffusion imaging does not show any acute or subacute infarction. No focal abnormality affects the brainstem. Few old small vessel cerebellar infarctions. Cerebral hemispheres show moderate chronic small-vessel ischemic change of the white matter. No cortical or large vessel territory infarction. Postcontrast imaging achieved today does not show any metastatic disease to the brain. No definite leptomeningeal disease. One could question early dural enhancement adjacent to the largest metastatic bone lesion of the lateral sphenoid wing on the left. The pituitary gland has a normal appearance measuring 15 x 3.5 x 12 mm. The infundibulum is normal. No evidence of pituitary tumor. Incidental arachnoid herniation into the sella. Vascular: Major vessels at the base of the brain show  flow. Skull and upper cervical spine: As seen previously, there are foci of abnormal marrow signal within the cervical spine, skull base and calvarium consistent with osseous metastatic disease. No e

## 2021-09-24 ENCOUNTER — Inpatient Hospital Stay (HOSPITAL_COMMUNITY): Payer: Medicare Other

## 2021-09-24 ENCOUNTER — Other Ambulatory Visit (HOSPITAL_COMMUNITY): Payer: Self-pay

## 2021-09-24 ENCOUNTER — Encounter (HOSPITAL_COMMUNITY): Payer: Self-pay | Admitting: Internal Medicine

## 2021-09-24 DIAGNOSIS — C7951 Secondary malignant neoplasm of bone: Secondary | ICD-10-CM | POA: Diagnosis not present

## 2021-09-24 DIAGNOSIS — D649 Anemia, unspecified: Secondary | ICD-10-CM | POA: Diagnosis not present

## 2021-09-24 DIAGNOSIS — C493 Malignant neoplasm of connective and soft tissue of thorax: Secondary | ICD-10-CM | POA: Diagnosis not present

## 2021-09-24 DIAGNOSIS — Z853 Personal history of malignant neoplasm of breast: Secondary | ICD-10-CM | POA: Diagnosis not present

## 2021-09-24 LAB — COMPREHENSIVE METABOLIC PANEL
ALT: 88 U/L — ABNORMAL HIGH (ref 0–44)
AST: 70 U/L — ABNORMAL HIGH (ref 15–41)
Albumin: 2.2 g/dL — ABNORMAL LOW (ref 3.5–5.0)
Alkaline Phosphatase: 88 U/L (ref 38–126)
Anion gap: 8 (ref 5–15)
BUN: 33 mg/dL — ABNORMAL HIGH (ref 8–23)
CO2: 28 mmol/L (ref 22–32)
Calcium: 10 mg/dL (ref 8.9–10.3)
Chloride: 102 mmol/L (ref 98–111)
Creatinine, Ser: 2 mg/dL — ABNORMAL HIGH (ref 0.44–1.00)
GFR, Estimated: 26 mL/min — ABNORMAL LOW (ref >60)
Glucose, Bld: 138 mg/dL — ABNORMAL HIGH (ref 70–99)
Potassium: 4.6 mmol/L (ref 3.5–5.1)
Sodium: 138 mmol/L (ref 135–145)
Total Bilirubin: 0.6 mg/dL (ref 0.3–1.2)
Total Protein: 6.2 g/dL — ABNORMAL LOW (ref 6.5–8.1)

## 2021-09-24 LAB — CBC
HCT: 27.2 % — ABNORMAL LOW (ref 36.0–46.0)
Hemoglobin: 8.5 g/dL — ABNORMAL LOW (ref 12.0–15.0)
MCH: 28.1 pg (ref 26.0–34.0)
MCHC: 31.3 g/dL (ref 30.0–36.0)
MCV: 89.8 fL (ref 80.0–100.0)
Platelets: 236 10*3/uL (ref 150–400)
RBC: 3.03 MIL/uL — ABNORMAL LOW (ref 3.87–5.11)
RDW: 16.5 % — ABNORMAL HIGH (ref 11.5–15.5)
WBC: 10.5 10*3/uL (ref 4.0–10.5)
nRBC: 0.2 % (ref 0.0–0.2)

## 2021-09-24 LAB — GLUCOSE, CAPILLARY
Glucose-Capillary: 100 mg/dL — ABNORMAL HIGH (ref 70–99)
Glucose-Capillary: 129 mg/dL — ABNORMAL HIGH (ref 70–99)
Glucose-Capillary: 173 mg/dL — ABNORMAL HIGH (ref 70–99)
Glucose-Capillary: 187 mg/dL — ABNORMAL HIGH (ref 70–99)

## 2021-09-24 LAB — PTH, INTACT AND CALCIUM
Calcium, Total (PTH): 9.5 mg/dL (ref 8.7–10.3)
PTH: 25 pg/mL (ref 15–65)

## 2021-09-24 LAB — CANCER ANTIGEN 27.29: CA 27.29: 24.4 U/mL (ref 0.0–38.6)

## 2021-09-24 MED ORDER — LORAZEPAM 1 MG PO TABS
1.0000 mg | ORAL_TABLET | Freq: Two times a day (BID) | ORAL | Status: DC
  Administered 2021-09-24 – 2021-09-26 (×4): 1 mg via ORAL
  Filled 2021-09-24 (×4): qty 1

## 2021-09-24 MED ORDER — FUROSEMIDE 10 MG/ML IJ SOLN
20.0000 mg | Freq: Once | INTRAMUSCULAR | Status: AC
  Administered 2021-09-24: 20 mg via INTRAVENOUS
  Filled 2021-09-24: qty 2

## 2021-09-24 NOTE — Progress Notes (Signed)
? ?Subjective: ?Patient is a 71 y.o. female with PMH significant for HFrEF 35%, breast cancer s/p left breast mastectomy 2008, CAD, persistent atrial fibrillation, who was admitted for severe hypokalemia induced rhabdomyolysis and also noted to have incidental finding of osseous metastases. Patient had an episode of shortness of breath this morning and was placed on 2L of O2 with improvement in her symptoms. Her appetite has been stable and she is tolerating PO intake without nausea, vomiting, or abdominal pain. She had one bowel movement yesterday. She does have some leg weakness due to deconditioning but is able to ambulate to the restroom without difficulty. She was unable to sleep well last night despite receiving her Ativan.  ? ?Objective: ? ?Vital signs in last 24 hours: ?Vitals:  ? 09/23/21 1752 09/23/21 2110 09/24/21 0606 09/24/21 0830  ?BP: 103/69 112/70 126/77 (!) 131/94  ?Pulse: 99 99 68 (!) 116  ?Resp: '15 17 20 15  '$ ?Temp: 99.3 ?F (37.4 ?C) 100.1 ?F (37.8 ?C) 98.7 ?F (37.1 ?C) (!) 97.3 ?F (36.3 ?C)  ?TempSrc: Oral Oral Oral Oral  ?SpO2: 96% 100% 94% 100%  ?Weight:      ?Height:      ? ?Weight change: no weight change recorded ? ?Intake/Output Summary (Last 24 hours) at 09/24/2021 0851 ?Last data filed at 09/24/2021 0601 ?Gross per 24 hour  ?Intake 240 ml  ?Output 801 ml  ?Net -561 ml  ? ? ?Physical Exam  ?Constitutional: resting comfortably and in no acute distress. Alert and oriented x3.  ?Cardiovascular:  irregularly irregular. Normal rate. No murmurs, rubs, or gallop noted on exam.  ?Respiratory:  Nasal canula in place. Normal respiratory effort without increase in work of breathing. No accessory muscle use. Lungs are clear to auscultation bilaterally.  ?Abdominal:  Soft and non-tender. Normal bowel sounds. No organomegaly or masses.  ?Extremities: Mild bilateral lower extremity, worse on the right but stable from prior exam.  ?Skin:  Dry and warm. No obvious lesion ?Psych:  normal mood and behavior.   ? ? ?Assessment/Plan: ? ?Principal Problem: ?  AKI (acute kidney injury) ([place]) ? ?Patient is a 71 y.o. female with PMH significant for breast cancer s/p left breast mastectomy in 2008, HFrEF 35%, CAD, persistent atrial fibrillation admitted for severe hypokalemia induced rhabdomyolysis as well as incidental finding of metastatic disease.  ? 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 XR of bilateral femur has been ordered to exclude lesions at risk for pathologic fracture. Cleared for discharge from oncology standpoint once biopsy is complete. Biopsy tentatively scheduled for tomorrow and Eliquis currently on hold. Patient has been afebrile overnight-- suspect cyclical fever last couple of days was d/t underlying malignancy. Corrected calcium of 13; suspect this is from bony mets from underlying metastatic malignancy, and plan for bisphosphonate therapy as an OP.  ?-Continue holding Eliquis and NPO at midnight for her biopsy tomorrow.  ?-Tylenol prn and oxycodone IR 10 mg TID prn for severe pain. Bowel regimen in place.  ?-Follow up bilateral femur radiographs  ?-Appreciate oncology colleagues; plan to f/u with her as an OP at [facility] during week of 5/22.  ?-Pending PTH-related peptide  ?-Ferrous sulfate 325 mg BID  ? ?Severe hypokalemia induced rhabdomyolysis, resolved ?AKI on CKD 3b,  resolved ?Patient's weakness has been stable, likely from deconditioning. Potassium improved from 3.4 to 4.6 this morning after starting daily K supplementation along with reduced dose of Torsemide 10 mg BID. Renal function continues to improve with Cr at 2, back to her baseline. ?-Continue with Torsemide 10 mg BID and  Kdur 40 BID    ?-Trend renal function  ?-Replete K as needed  ?-Follow up on renin/aldosteronism ratio for concern of hyperaldosteronism resulting in severe hypokalemia on presentation  ? ?Chronic HFrEF (EF 35%) ?Intermittent shortness of breath that is improving. Intermittently requiring supplemental O2. On exam mild bilateral lower extremity edema is stable from prior exam. She is net negative 1L over last 24 hours since starting reduced dose Torsemide 10 mg BID and weight has been stable.   ?-Daily weight and strict I/O's ?-Continue Torsemide and potassium supplementation.  ?-Continue spironolactone 25 mg daily  ?-Continue holding [place] ? ?Insomnia ?Hx of anxiety  ?Patient has some anxiety and difficulty sleeping in light of her biopsy procedure. Patient has not been receiving her home Ativan for last 2 days. Anticipate improvement in sleep and anxiety with her home ativan.  ?-Will restart patient on her home Ativan 1 mg BID.  ? ?Transaminitis, resolving ?HLD   ?Likely from severe hypokalemia induced rhabdomyolysis. LFTs continue to downtrend, with AST 70 and ALT 88.   ?-Trend CMP  ?-Continue holding rosuvastatin  ?-Continue Zetia  ? ?Persistent atrial fibrillation  ?Chronic and stable.  ?-Holding Eliquis for her biopsy tomorrow ? ?HTN ?Stable; normotensive 120/70's  ?-Continue home Imdur 60 mg daily  ? ?T2DM ?Blood glucose has been stable.   ?-Continue Semglee and SSI ? ? ? LOS: 5 days  ? ?Clydell Hakim, Medical Student ?09/24/2021, 8:51 AM ? ?Pager number: [phone] ? ? ?Attestation for Student Documentation: ? ?I personally was present and performed or re-performed the history, physical exam and medical decision-making activities of this service and have verified that the service and findings are accurately documented in the student?s note. ? ?Lajean Manes, MD ?09/24/2021, 10:45 AM ?

## 2021-09-24 NOTE — Progress Notes (Signed)
?   09/24/21 1721  ?Assess: MEWS Score  ?Temp 99.4 ?F (37.4 ?C)  ?BP 113/75  ?Pulse Rate (!) 120  ?Resp 16  ?Level of Consciousness Alert  ?SpO2 99 %  ?Assess: MEWS Score  ?MEWS Temp 0  ?MEWS Systolic 0  ?MEWS Pulse 2  ?MEWS RR 0  ?MEWS LOC 0  ?MEWS Score 2  ?MEWS Score Color Yellow  ?Assess: if the MEWS score is Yellow or Red  ?Were vital signs taken at a resting state? Yes  ?Focused Assessment No change from prior assessment  ?Early Detection of Sepsis Score *See Row Information* Low  ?MEWS guidelines implemented *See Row Information* Yes  ?Treat  ?MEWS Interventions Other (Comment) ?(monitor patient history AFIB)  ?Take Vital Signs  ?Increase Vital Sign Frequency  Yellow: Q 2hr X 2 then Q 4hr X 2, if remains yellow, continue Q 4hrs  ?Escalate  ?MEWS: Escalate Yellow: discuss with charge nurse/RN and consider discussing with provider and RRT  ?Notify: Charge Nurse/RN  ?Name of Charge Nurse/RN Notified Ulice Dash  ?Date Charge Nurse/RN Notified 09/24/21  ?Time Charge Nurse/RN Notified 1730  ?Notify: Provider  ?Provider Name/Title Internal medicine  ?Date Provider Notified 09/24/21  ?Time Provider Notified 2023  ?Method of Notification Page  ?Notification Reason Change in status  ?Provider response See new orders  ?Date of Provider Response 09/24/21  ?Time of Provider Response 2023  ?Document  ?Patient Outcome Other (Comment) ?(monitoring)  ?Progress note created (see row info) Yes  ? ? ?

## 2021-09-24 NOTE — Progress Notes (Signed)
?   09/24/21 2025  ?Assess: MEWS Score  ?Temp (!) 101.1 ?F (38.4 ?C)  ?BP 102/63  ?Pulse Rate (!) 136  ?Resp 18  ?SpO2 99 %  ?O2 Device Nasal Cannula  ?O2 Flow Rate (L/min) 2 L/min  ?Assess: MEWS Score  ?MEWS Temp 1  ?MEWS Systolic 0  ?MEWS Pulse 3  ?MEWS RR 0  ?MEWS LOC 0  ?MEWS Score 4  ?MEWS Score Color Red  ?Assess: if the MEWS score is Yellow or Red  ?Were vital signs taken at a resting state? Yes  ?Focused Assessment Change from prior assessment (see assessment flowsheet)  ?Early Detection of Sepsis Score *See Row Information* Low  ?MEWS guidelines implemented *See Row Information* Yes  ?Take Vital Signs  ?Increase Vital Sign Frequency  Red: Q 1hr X 4 then Q 4hr X 4, if remains red, continue Q 4hrs  ?Escalate  ?MEWS: Escalate Red: discuss with charge nurse/RN and provider, consider discussing with RRT  ?Notify: Charge Nurse/RN  ?Name of Charge Nurse/RN Notified [place]  ?Date Charge Nurse/RN Notified 09/24/21  ?Time Charge Nurse/RN Notified 2031  ?Notify: Provider  ?Provider Name/Title Dean,DO  ?Date Provider Notified 09/24/21  ?Time Provider Notified 2030  ?Method of Notification Page ?(secure chat)  ?Notification Reason Change in status  ?Provider response See new orders  ?Date of Provider Response 09/24/21  ?Time of Provider Response 2030  ?Notify: Rapid Response  ?Name of Rapid Response RN Notified Dave,RN  ?Date Rapid Response Notified 09/24/21  ?Time Rapid Response Notified 2038  ? ? ?

## 2021-09-24 NOTE — Plan of Care (Signed)
  Problem: Elimination: Goal: Will not experience complications related to bowel motility Outcome: Progressing   

## 2021-09-24 NOTE — Progress Notes (Signed)
HEMATOLOGY-ONCOLOGY PROGRESS NOTE ? ?ASSESSMENT AND PLAN: ?1.  Bone lesions of the cervical spine ?-MRI brain with and without contrast 09/21/2021-multiple metastatic bone lesions, largest at the lateral sphenoid wing on the left ?-MRI of the cervical spine with and without contrast 09/21/2021-metastatic disease throughout the cervical region most prominent at C3, C6, C7, and T1.  T3 also involved but not well evaluated. ?-CT chest/abdomen/pelvis without contrast 09/22/2021-diffuse lytic and sclerotic lesions in the bones compatible with metastatic disease, mild compression deformity in the inferior endplate of E83 which is indeterminate in age, indeterminate hypodensity in the posterior right lobe of the liver measuring 1.2 cm. ?-Bone scan 09/23/2021-numerous areas of abnormal uptake including the distal femurs ?2.  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  Fibrosarcoma of the left breast ?-Status post left breast mastectomy, positive surgical margins ?4.  Normocytic anemia ?5.  Acute on chronic kidney disease ?6.  Hypercalcemia ?7.  Transaminitis ?8.  Elevated TSH and free T4 ?9.  HFrEF 35% diagnosed February 2023 ?10.  Permanent atrial fibrillation ?11.  Hypertension ?12.  CAD with prior PCI ?13.  Diabetes mellitus ?14.  Hyperlipidemia ?15.  Uterine fibroids ? ?Stephanie Franco appears stable.  She is scheduled for biopsy of bone lesion today.  No new complaint.  I reviewed the bone scan and CT images with Stephanie Franco and her husband.  We discussed the differential diagnosis including the most likely diagnosis of metastatic breast cancer.  We will wait on the biopsy result to make treatment recommendations.  We will give biphosphonate therapy as an outpatient. ? ? ?Recommendations: ?1.  CT-guided  biopsy of a bone lesion ?2.  Plain x-ray of both femurs to rule out an impending fracture ?3.  Biphosphonate therapy will be given as an outpatient if a malignancy is confirmed ?4.  Outpatient follow-up will be scheduled at the Cancer center ? ?SUBJECTIVE:  No new complaints.  ? ?PHYSICAL EXAMINATION: ? ?Vitals:  ? 09/23/21 2110 09/24/21 0606  ?BP: 112/70 126/77  ?Pulse: 99 68  ?Resp: 17 20  ?Temp: 100.1 ?F (37.8 ?C) 98.7 ?F (37.1 ?C)  ?SpO2: 100% 94%  ? ?Filed Weights  ? 09/19/21 1752 09/20/21 1517  ?Weight: 178 lb 12.7 oz (81.1 kg) 182 lb 12.2 oz (82.9 kg)  ? ? ?Intake/Output from previous day: ?05/09 0701 - 05/10 0700 ?In: 240 [P.O.:240] ?Out: 1301 [Urine:1300; Stool:1] ? ?Physical Exam ?Vitals reviewed.  ?Constitutional:   ?   General: She is not in acute distress. ?Eyes:  ?   General: No scleral icterus. ?   Conjunctiva/sclera: Conjunctivae normal.  ?Cardiovascular:  ?   Rate and Rhythm: Rhythm irregular.  ?Pulmonary:  ?   Effort: No respiratory distress.  ?   Breath sounds: Normal breath sounds.  ?Abdominal:  ?   Palpations: Abdomen is soft.  ?Skin: ?   General: Skin is warm and dry.  ?Neurological:  ?   Mental Status: She is alert and oriented to person, place, and time.  ? ? ?LABORATORY DATA:  ?I have reviewed the data as listed ? ?  Latest Ref Rng & Units 09/24/2021  ?  3:54 AM 09/23/2021  ?  5:36 AM 09/22/2021  ?  4:52 AM  ?CMP  ?Glucose 70 - 99 mg/dL 138   126   142    ?  BUN 8 - 23 mg/dL 33   31   29    ?Creatinine 0.44 - 1.00 mg/dL 2.00   2.04   2.13    ?Sodium 135 - 145 mmol/L 138   137   138    ?Potassium 3.5 - 5.1 mmol/L 4.6   3.4   3.9    ?Chloride 98 - 111 mmol/L 102   100   101    ?CO2 22 - 32 mmol/L _0 ?Calcium 8.9 - 10.3 mg/dL 10.0   10.0   10.4    ?Total Protein 6.5 - 8.1 g/dL 6.2   6.4   6.7    ?Total Bilirubin 0.3 - 1.2 mg/dL 0.6   0.5   0.9    ?Alkaline Phos 38 - 126 U/L 88   90   90    ?AST 15 - 41 U/L 70   84   123    ?ALT 0 - 44 U/L 88   92   106    ? ? ?Lab Results  ?Component  Value Date  ? WBC 10.5 09/24/2021  ? HGB 8.5 (L) 09/24/2021  ? HCT 27.2 (L) 09/24/2021  ? MCV 89.8 09/24/2021  ? PLT 236 09/24/2021  ? NEUTROABS 8.1 (H) 09/21/2021  ? ? ?No results found for: CEA1, CEA, [id number], CA125, PSA1 ? ?MR BRAIN WO CONTRAST ? ?Result Date: 09/19/2021 ?CLINICAL DATA:  Brain/CNS neoplasm, staging assess for pituitary adenoma EXAM: MRI HEAD WITHOUT CONTRAST TECHNIQUE: Multiplanar, multiecho pulse sequences of the brain and surrounding structures were obtained without intravenous contrast. COMPARISON:  None. FINDINGS: Brain: No acute infarction, hemorrhage, hydrocephalus, extra-axial collection or mass lesion. Moderate scattered T2/FLAIR hyperintensities in the white matter, nonspecific but compatible with chronic microvascular ischemic disease. The pituitary gland is unremarkable in size without obvious mass lesion; however, evaluation is limited on this noncontrast, non pituitary protocol study. Vascular: Major arterial flow voids are maintained skull base. Skull and upper cervical spine: Multiple areas of abnormal signal within the visualized cervical spine, skull base, and calvarium, suspicious for osseous metastatic disease. Sinuses/Orbits: Mild paranasal sinus mucosal thickening. Other: Small left mastoid effusion. IMPRESSION: 1. No obvious intracranial metastatic disease or pituitary mass; however, malignancy is not excluded on this noncontrast study. Recommend postcontrast whole brain imaging and pituitary protocol. 2. Moderate chronic microvascular ischemic disease. 3. Multiple areas of abnormal signal within the visualized cervical spine, skull base, and calvarium, suspicious for osseous metastatic disease. The recommended postcontrast imaging can better assess. Electronically Signed   By: Margaretha Sheffield M.D.   On: 09/19/2021 15:50  ? ?MR BRAIN W WO CONTRAST ? ?Result Date: 09/21/2021 ?CLINICAL DATA:  Brain metastases, unknown primary. EXAM: MRI HEAD WITHOUT AND WITH CONTRAST TECHNIQUE:  Multiplanar, multiecho pulse sequences of the brain and surrounding structures were obtained without and with intravenous contrast. CONTRAST:  8.59m GADAVIST GADOBUTROL 1 MMOL/ML IV SOLN COMPARISON:  Prior noncontrast exam 09/19/2021 FINDINGS: Brain: Diffusion imaging does not show any acute or subacute infarction. No focal abnormality affects the brainstem. Few old small vessel cerebellar infarctions. Cerebral hemispheres show moderate chronic small-vessel ischemic change of the white matter. No cortical or large vessel territory infarction. Postcontrast imaging achieved today does not show any metastatic disease to the brain. No definite leptomeningeal disease. One could question early dural enhancement adjacent to the largest metastatic bone lesion of the lateral sphenoid wing on the left. The pituitary gland has a normal appearance measuring 15 x 3.5 x  12 mm. The infundibulum is normal. No evidence of pituitary tumor. Incidental arachnoid herniation into the sella. Vascular: Major vessels at the base of the brain show flow. Skull and upper cervical spine: As seen previously, there are foci of abnormal marrow signal within the cervical spine, skull base and calvarium consistent with osseous metastatic disease. No extraosseous tumor is identified. Sinuses/Orbits: Clear/normal Other: None IMPRESSION: No evidence of metastatic disease to the brain. Multiple metastatic bone lesions. The largest is at the lateral sphenoid wing on the left. There may be mild dural enhancement adjacent to that bony metastasis. Normal appearance of the pituitary gland. Chronic small-vessel ischemic changes throughout the brain as seen previously and outlined above. Electronically Signed   By: Nelson Chimes M.D.   On: 09/21/2021 19:40  ? ?MR CERVICAL SPINE W WO CONTRAST ? ?Result Date: 09/21/2021 ?CLINICAL DATA:  Metastatic disease of unknown primary. EXAM: MRI CERVICAL SPINE WITHOUT AND WITH CONTRAST TECHNIQUE: Multiplanar and multiecho  pulse sequences of the cervical spine, to include the craniocervical junction and cervicothoracic junction, were obtained without and with intravenous contrast. CONTRAST:  8.23m GADAVIST GADOBUTROL 1 MMOL/ML

## 2021-09-24 NOTE — Plan of Care (Signed)
  Problem: Elimination: Goal: Will not experience complications related to bowel motility Outcome: Progressing   Problem: Elimination: Goal: Will not experience complications related to urinary retention Outcome: Progressing   

## 2021-09-24 NOTE — H&P (Signed)
Chief Complaint: Patient was seen in consultation today for bone lesion biopsy   at the request of Clenton Pare, NP  Referring Physician(s): Clenton Pare, NP  Supervising Physician: Simonne Come  Patient Status: [facility] - In-pt  History of Present Illness: Stephanie Franco is a 71 y.o. female with past medical history significant for HFrEF, stage IV breast cancer s/p left breast mastectomy (2008), CAD, CKD stage III, DM II and A fib. Pt was admitted for severe hypokalemia induced rhabdomyolysis. Pt had MRI brain 09/19/21 that suspicious metastatic osseous disease. Pt had bone scan 09/23/21 that showed numerous scattered osseous metastases. Pt was referred to IR for bone lesion biopsy by Clenton Pare, NP.  Dr. Fredia Sorrow approved iliac lesion biopsy.   NM Bone scan 09/23/21: IMPRESSION: Numerous sites of abnormal tracer uptake consistent with scattered osseous metastases, including sites at the distal femoral diaphyses bilaterally and adjacent to the femoral component of the RIGHT hip prosthesis; recommend radiographic correlation of BILATERAL femora to exclude lesions at risk for pathologic fracture.  Past Medical History:  Diagnosis Date   Aortic atherosclerosis (HCC)    Arthritis    Asthma    Atrial fibrillation, permanent (HCC)    Rate control with Bystolic. CHA2DS2Vasc = 6 (HTN, DM, CHF, Age 38, Female) -> on Pradaxa   Breast cancer (HCC) 2008   S/P mastectomy   CAD S/P percutaneous coronary angioplasty 2006   PCI of circumflex with Taxus DES;; relook-cath Feb 2013: 50-60% short lesion in RCA, 40% ISR Circumflex stent.;  06/2021: Patent LCx stent.  Distal LCx 90% stenosis, proximal-mid RCA 60%.   CHF (congestive heart failure), NYHA class II, chronic, combined (HCC)    CKD (chronic kidney disease), stage III (HCC)    Complication of anesthesia    prolonged sedation after colonoscopy in [place]   Diabetes mellitus, uncontrolled 07/14/2011   Dilated cardiomyopathy (HCC) 06/2021    2019: EF down to 25% improved to 50 to 55% x 2020. => Recurrent cardiomyopathy 06/2021 => EF 25 to 30% on Echo, CMR showed severe BiV failure (LVEF 36%, diffuse HK; RVEF 32%).  Severe biatrial enlargement.  Moderate MR posterior directed.  Suspect functional (suggested by TEE); noncoronary pattern for delayed gadolinium-suggest myocarditis, & prior Ant MI-subendocardial => Mixed cardiomyopathy   Edema of both legs    Usually mild, chronic. Controlled with when necessary furosemide and diet   Fibroid, uterine 07/13/2011   "have that now"   GERD (gastroesophageal reflux disease)    Gout    Hyperlipidemia    Hypertension    Hypokalemia    Morbid obesity (HCC)    BMI 41   OSA on CPAP    On CPAP   Pulmonary hypertension, unspecified (HCC)    PAP ~90 mmHg on Echo 12/2017 -- has OSA on CPAP & obesity -> follow-up sleep study showed mild OSA.;  09/22/2021: RHC showed PAP of 86/30 mmHg with LVEDP of 14 mmHg.   Systolic murmur     Past Surgical History:  Procedure Laterality Date   Cardiac MRI  06/24/2021   Severe biventricular failure: LVEF 36%-diffuse HK. RV EF 32%. Severe BiA Enlargement. Mod MR. LGE in septum - non-coronary pattern -? Myocarditis. Anterior wall LGE is subendocardial - ? Prior MI.   COLONOSCOPY     CORONARY ANGIOPLASTY WITH STENT PLACEMENT  2006   stent to circumflex   DILATION AND CURETTAGE OF UTERUS     LEFT HEART CATHETERIZATION WITH CORONARY ANGIOGRAM N/A 07/13/2011   Procedure: LEFT HEART CATHETERIZATION  WITH CORONARY ANGIOGRAM;  Surgeon: Marykay Lex, MD;  Location: [facility] CATH LAB;  Service: Cardiovascular;  normal EF; 2mm 50-60% lesion in RCA; patent stent in circumflex form 2006 with ~40% in-stent re-stenosis   MASTECTOMY  2008   left   NM MYOVIEW LTD  7/'16; 6/'21   LOW RISK. No ischemia or infarction. Not gated 2/2  A. fib - unable to assess EF   RIGHT/LEFT HEART CATH AND CORONARY ANGIOGRAPHY N/A 06/23/2021   Procedure: RIGHT/LEFT HEART CATH AND CORONARY  ANGIOGRAPHY;  Surgeon: Runell Gess, MD;  Location: [facility] INVASIVE CV LAB;  Service: Cardiovascular; p-mRCA 60%.  Distal LCx 90%. mLAD stent patent.  RAP 22 RVP 70/1 mmHg with PAP 80/30 mmHg. LVEDP 14 mmHg-unable to assess PCWP.   TEE WITHOUT CARDIOVERSION N/A 06/25/2021   Procedure: TRANSESOPHAGEAL ECHOCARDIOGRAM (TEE);  Surgeon: Laurey Morale, MD;  Location: [facility] ENDOSCOPY; EF 35 to 40% with global HK.  Mildly reduced RV function.  Severely dilated left atrium with no LAA thrombus.  ?  Mild RA dilation.  Posterior MVL restriction w/ moderate eccentric/posterior directed MR.  No PV reversal. ~ functional MR 2/2 Afib.  Mild dilation of the AscAo (~ 40 mm)   TOTAL HIP ARTHROPLASTY Right 01/19/2018   Procedure: RIGHT TOTAL HIP ARTHROPLASTY ANTERIOR APPROACH;  Surgeon: Samson Frederic, MD;  Location: [facility] ORS;  Service: Orthopedics;  Laterality: Right;  Needs RNFA   TRANSTHORACIC ECHOCARDIOGRAM  6/'15; 9/'16   a) In setting of Urosepsis: EF ~25 %, global HK (poor quality study);; b) LV EF 40-45%, Mild Anteroseptal HK. Mod-Severe  RA dilation with elevated PA Pressures (~~peak 63 mmHg).. Mild LA dilation   TRANSTHORACIC ECHOCARDIOGRAM  12/2017; 04/15/2018   a) Improved LVEF to 45 to 50%.  Pulmonary HTN ~PAP ~ 60 mmHg. Severe LA dilation& mild /rv dilation.; b) Mod LVH. EF 40-45% (back to prior EF). Unable to assess DF (Afib). Severe R&L Atrial enlargement.  Moderate Post-Lateral directed MR with Mod RV dilation.  PAP ~66 mmHg.;    TRANSTHORACIC ECHOCARDIOGRAM  01/2019   relook Echo: EF back up to 50 to 55%.  Indeterminate diastolic parameters.  Moderately dilated left atrium and right atrium.  Moderate MR.  Mild to moderate TR.   TRANSTHORACIC ECHOCARDIOGRAM  06/21/2021   EF 25 to 30%.  Severely reduced function.  Paradoxical septal motion consistent with LBBB.  Unable to assess diastolic pressures.  Severe biatrial dilation.  Mildly elevated PA P estimated 43.5 mmHg-with RAP estimated at 8 mmHg..  Moderate  posteriorly directed MR.    Allergies: Patient has no known allergies.  Medications: Prior to Admission medications   Medication Sig Start Date End Date Taking? Authorizing Provider  acetaminophen (TYLENOL) 500 MG tablet Take 1,000 mg by mouth every 6 (six) hours as needed for mild pain.   Yes [provider]  albuterol (PROVENTIL HFA;VENTOLIN HFA) 108 (90 Base) MCG/ACT inhaler Inhale 1-2 puffs into the lungs every 6 (six) hours as needed for wheezing or shortness of breath. 05/20/15  Yes Leta Baptist, MD  albuterol (PROVENTIL) (2.5 MG/3ML) 0.083% nebulizer solution Take 3 mLs (2.5 mg total) by nebulization every 6 (six) hours as needed for wheezing or shortness of breath. 05/20/15  Yes Leta Baptist, MD  allopurinol (ZYLOPRIM) 300 MG tablet Take 300 mg by mouth daily.   Yes [provider]  dapagliflozin propanediol (FARXIGA) 10 MG TABS tablet Take 1 tablet (10 mg total) by mouth daily. 06/28/21  Yes Allayne Butcher, PA-C  Everlene Balls  5 MG TABS tablet TAKE 1 TABLET BY MOUTH TWICE A DAY Patient taking differently: Take 5 mg by mouth 2 (two) times daily. 07/11/19  Yes Marykay Lex, MD  ezetimibe (ZETIA) 10 MG tablet Take 1 tablet (10 mg total) by mouth daily. 06/27/21  Yes Simmons, Brittainy M, PA-C  fexofenadine (ALLEGRA) 180 MG tablet Take 180 mg by mouth daily as needed for allergies.  04/30/15  Yes [provider]  fluticasone (FLONASE) 50 MCG/ACT nasal spray Place 2 sprays into the nose daily as needed for allergies or rhinitis.    Yes [provider]  gabapentin (NEURONTIN) 600 MG tablet Take 300 mg by mouth daily.   Yes [provider]  isosorbide mononitrate (IMDUR) 60 MG 24 hr tablet Take 1 tablet (60 mg total) by mouth daily. 07/04/21 09/18/21 Yes Milford, Anderson Malta, FNP  LORazepam (ATIVAN) 1 MG tablet Take 1 mg by mouth 2 (two) times daily. 07/07/16  Yes [provider]  losartan (COZAAR) 25 MG tablet Take 1/2 tablet (12.5 mg  total) by mouth daily. 06/28/21  Yes Robbie Lis M, PA-C  Multiple Vitamins-Minerals (MULTIVITAMIN WITH MINERALS) tablet Take 1 tablet by mouth daily.   Yes [provider]  Naphazoline-Glycerin (CLEAR EYES MAX REDNESS RELIEF OP) Place 2 drops into both eyes daily as needed (redness).   Yes [provider]  nitroGLYCERIN (NITROSTAT) 0.4 MG SL tablet DISSOLVE 1 TABLET UNDER THE TONGUE EVERY 5 MINUTES AS NEEDED FOR CHEST PAIN. Patient taking differently: Place 0.4 mg under the tongue every 5 (five) minutes as needed for chest pain. 07/25/21  Yes Marykay Lex, MD  OVER THE COUNTER MEDICATION Apply 1 application. topically daily as needed (pain). Apply to legs and feet   Yes [provider]  Oxycodone HCl 10 MG TABS Take 5 mg by mouth 3 (three) times daily as needed for pain. 07/26/21  Yes [provider]  pantoprazole (PROTONIX) 40 MG tablet TAKE 1 TABLET BY MOUTH EVERY DAY Patient taking differently: Take 40 mg by mouth daily. 04/02/21  Yes Marykay Lex, MD  rosuvastatin (CRESTOR) 40 MG tablet Take 1 tablet (40 mg total) by mouth daily. 05/14/21 09/18/21 Yes Marykay Lex, MD  torsemide (DEMADEX) 20 MG tablet Take 20 mg by mouth 2 (two) times daily.   Yes [provider]  TOUJEO SOLOSTAR 300 UNIT/ML Solostar Pen Inject 30 Units into the skin 2 (two) times daily. 07/28/21  Yes [provider]  triamcinolone ointment (KENALOG) 0.5 % Apply to legs 2 times daily for 2 weeks Patient taking differently: Apply 1 application. topically 2 (two) times daily as needed (Irritation). 05/02/18  Yes Marykay Lex, MD  glucose blood test strip 1 each by Other route as needed for other. Use as instructed    [provider]     Family History  Problem Relation Age of Onset   Coronary artery disease Mother    Hypertension Mother    Heart disease Father    Atrial fibrillation Son    Lung cancer Sister    Heart disease Brother    Heart  disease Brother    Healthy Son    Thyroid disease Daughter    Healthy Daughter    Healthy Daughter     Social History   Socioeconomic History   Marital status: Married    Spouse name: Not on file   Number of children: 4   Years of education: Not on file   Highest education level: Not  on file  Occupational History   Not on file  Tobacco Use   Smoking status: Former    Packs/day: 0.50    Years: 6.00    Pack years: 3.00    Types: Cigarettes    Quit date: 05/18/1992    Years since quitting: 29.3   Smokeless tobacco: Never   Tobacco comments:    Former smoker 08/15/21  Vaping Use   Vaping Use: Never used  Substance and Sexual Activity   Alcohol use: No    Comment: 07/13/11 "have drank occasionally; not now"   Drug use: No   Sexual activity: Not Currently    Birth control/protection: Surgical  Other Topics Concern   Not on file  Social History Narrative   She is a married mother of 4, grandmother 2. Usually accompanied by her husband. She does not work. She had been working on her exercise, but is no longer as active. Does not drink and does not smoke   Social Determinants of Corporate investment banker Strain: Not on file  Food Insecurity: Not on file  Transportation Needs: Not on file  Physical Activity: Not on file  Stress: Not on file  Social Connections: Not on file    Review of Systems: A 12 point ROS discussed and pertinent positives are indicated in the HPI above.  All other systems are negative.  Review of Systems  Constitutional:  Positive for fever. Negative for chills.  Respiratory:  Positive for shortness of breath. Negative for cough.   Cardiovascular:  Negative for chest pain and leg swelling.  Gastrointestinal:  Negative for abdominal pain, nausea and vomiting.  Neurological:  Negative for dizziness and headaches.  Hematological:  Bruises/bleeds easily.   Vital Signs: BP 106/75   Pulse (!) 58   Temp (!) 97.3 F (36.3 C) (Oral)   Resp 15   Ht  5\' 3"  (1.6 m)   Wt 182 lb 12.2 oz (82.9 kg)   SpO2 100%   BMI 32.37 kg/m   Physical Exam Vitals reviewed.  Constitutional:      General: She is not in acute distress.    Appearance: Normal appearance. She is not ill-appearing.  HENT:     Head: Normocephalic and atraumatic.     Mouth/Throat:     Mouth: Mucous membranes are moist.     Pharynx: Oropharynx is clear.  Eyes:     Extraocular Movements: Extraocular movements intact.     Pupils: Pupils are equal, round, and reactive to light.  Cardiovascular:     Rate and Rhythm: Tachycardia present. Rhythm irregular.     Pulses: Normal pulses.     Heart sounds: Normal heart sounds.  Pulmonary:     Effort: Pulmonary effort is normal. No respiratory distress.     Breath sounds: Normal breath sounds.  Abdominal:     General: There is no distension.     Palpations: Abdomen is soft.     Tenderness: There is no abdominal tenderness. There is no guarding.  Musculoskeletal:     Right lower leg: No edema.     Left lower leg: No edema.  Skin:    General: Skin is warm and dry.  Neurological:     Mental Status: She is alert and oriented to person, place, and time.  Psychiatric:        Mood and Affect: Mood normal.        Behavior: Behavior normal.        Thought Content: Thought content normal.  Judgment: Judgment normal.    Imaging: MR BRAIN WO CONTRAST  Result Date: 09/19/2021 CLINICAL DATA:  Brain/CNS neoplasm, staging assess for pituitary adenoma EXAM: MRI HEAD WITHOUT CONTRAST TECHNIQUE: Multiplanar, multiecho pulse sequences of the brain and surrounding structures were obtained without intravenous contrast. COMPARISON:  None. FINDINGS: Brain: No acute infarction, hemorrhage, hydrocephalus, extra-axial collection or mass lesion. Moderate scattered T2/FLAIR hyperintensities in the white matter, nonspecific but compatible with chronic microvascular ischemic disease. The pituitary gland is unremarkable in size without obvious mass  lesion; however, evaluation is limited on this noncontrast, non pituitary protocol study. Vascular: Major arterial flow voids are maintained skull base. Skull and upper cervical spine: Multiple areas of abnormal signal within the visualized cervical spine, skull base, and calvarium, suspicious for osseous metastatic disease. Sinuses/Orbits: Mild paranasal sinus mucosal thickening. Other: Small left mastoid effusion. IMPRESSION: 1. No obvious intracranial metastatic disease or pituitary mass; however, malignancy is not excluded on this noncontrast study. Recommend postcontrast whole brain imaging and pituitary protocol. 2. Moderate chronic microvascular ischemic disease. 3. Multiple areas of abnormal signal within the visualized cervical spine, skull base, and calvarium, suspicious for osseous metastatic disease. The recommended postcontrast imaging can better assess. Electronically Signed   By: Feliberto Harts M.D.   On: 09/19/2021 15:50   MR BRAIN W WO CONTRAST  Result Date: 09/21/2021 CLINICAL DATA:  Brain metastases, unknown primary. EXAM: MRI HEAD WITHOUT AND WITH CONTRAST TECHNIQUE: Multiplanar, multiecho pulse sequences of the brain and surrounding structures were obtained without and with intravenous contrast. CONTRAST:  8.15mL GADAVIST GADOBUTROL 1 MMOL/ML IV SOLN COMPARISON:  Prior noncontrast exam 09/19/2021 FINDINGS: Brain: Diffusion imaging does not show any acute or subacute infarction. No focal abnormality affects the brainstem. Few old small vessel cerebellar infarctions. Cerebral hemispheres show moderate chronic small-vessel ischemic change of the white matter. No cortical or large vessel territory infarction. Postcontrast imaging achieved today does not show any metastatic disease to the brain. No definite leptomeningeal disease. One could question early dural enhancement adjacent to the largest metastatic bone lesion of the lateral sphenoid wing on the left. The pituitary gland has a normal  appearance measuring 15 x 3.5 x 12 mm. The infundibulum is normal. No evidence of pituitary tumor. Incidental arachnoid herniation into the sella. Vascular: Major vessels at the base of the brain show flow. Skull and upper cervical spine: As seen previously, there are foci of abnormal marrow signal within the cervical spine, skull base and calvarium consistent with osseous metastatic disease. No extraosseous tumor is identified. Sinuses/Orbits: Clear/normal Other: None IMPRESSION: No evidence of metastatic disease to the brain. Multiple metastatic bone lesions. The largest is at the lateral sphenoid wing on the left. There may be mild dural enhancement adjacent to that bony metastasis. Normal appearance of the pituitary gland. Chronic small-vessel ischemic changes throughout the brain as seen previously and outlined above. Electronically Signed   By: Paulina Fusi M.D.   On: 09/21/2021 19:40   MR CERVICAL SPINE W WO CONTRAST  Result Date: 09/21/2021 CLINICAL DATA:  Metastatic disease of unknown primary. EXAM: MRI CERVICAL SPINE WITHOUT AND WITH CONTRAST TECHNIQUE: Multiplanar and multiecho pulse sequences of the cervical spine, to include the craniocervical junction and cervicothoracic junction, were obtained without and with intravenous contrast. CONTRAST:  8.37mL GADAVIST GADOBUTROL 1 MMOL/ML IV SOLN COMPARISON:  Radiography 11/29/2019 FINDINGS: Alignment: No malalignment. Vertebrae: Osseous metastatic involvement as follows. C1 and C2: Negative C3: Metastatic deposit within the posterior vertebral body. No extraosseous extension. C4 and C5: Negative C6: Small  patchy metastatic lesions within the right lateral in the left lateral portion of the vertebral body. Small metastatic focus within the spinous process. Metastatic disease in the right pedicle and lateral mass. C7: Metastatic disease within the right side of the vertebral body, including extension into a prominent posterosuperior posteriorly projecting  osteophyte. No extraosseous tumor. T1: Extensive metastatic disease to the vertebral body. Metastatic disease of the right lateral mass T3 also appears involved but is not well evaluated. Cord: No evidence of metastatic disease to the cord. See below regarding stenosis. Posterior Fossa, vertebral arteries, paraspinal tissues: No neck soft tissue lesion is seen. Disc levels: No significant stenosis at the foramen magnum, C1-2 or C2-3. C3-4: Endplate osteophytes and bulging of the disc. Mild ligamentous prominence. No compressive stenosis. C4-5: Endplate osteophytes and bulging of the disc. Narrowing of the ventral subarachnoid space. AP diameter of the canal in the midline 7.5 mm. No significant foraminal stenosis. C5-6: Endplate osteophytes and disc protrusion. Effacement of the subarachnoid space. AP diameter of the canal in the midline 6.3 mm. Mild cord deformity. No compressive foraminal narrowing. C6-7: Posterior projecting osteophytes and protruding disc material. Canal narrowing with AP diameter in the midline 6.2 mm. Effacement the subarachnoid space with slight deformity of the cord. Foramina sufficiently patent. C7-T1: Widely patent. IMPRESSION: Metastatic disease throughout the cervical region as outlined above, with most prominent involvement at C3, C6, C7 and T1. T3 is also involved but is not well evaluated. No extraosseous tumor causing neural compression. No evidence of metastatic disease to the spinal cord itself. Chronic degenerative changes of the cervical spine with spinal stenosis from C4-5 through C6-7. Minimal cord diameter is 6.2 mm at C5-6 and C6-7 with some deformity of the cord. Electronically Signed   By: Paulina Fusi M.D.   On: 09/21/2021 19:52   NM Bone Scan Whole Body  Result Date: 09/23/2021 CLINICAL DATA:  Stage IV breast cancer, restaging EXAM: NUCLEAR MEDICINE WHOLE BODY BONE SCAN TECHNIQUE: Whole body anterior and posterior images were obtained approximately 3 hours after  intravenous injection of radiopharmaceutical. RADIOPHARMACEUTICALS:  21.1 mCi Technetium-58m MDP IV COMPARISON:  None Radiographic correlation: CT chest abdomen pelvis 09/22/2021 FINDINGS: Multiple foci of abnormal osseous tracer uptake consistent with osseous metastatic disease. These include calvaria, sternum, RIGHT clavicle, ribs, thoracic/lumbar spine, and pelvis. Additional foci of increased tracer uptake at the distal femoral diaphyses bilaterally. Photopenic defect at RIGHT hip consistent with hip prosthesis, by history placed 01/19/2018, with slightly increased tracer uptake along the proximal femoral portion; this corresponds to an area of cortical thinning question metastasis on CT Expected urinary tract and soft tissue distribution of tracer. IMPRESSION: Numerous sites of abnormal tracer uptake consistent with scattered osseous metastases, including sites at the distal femoral diaphyses bilaterally and adjacent to the femoral component of the RIGHT hip prosthesis; recommend radiographic correlation of BILATERAL femora to exclude lesions at risk for pathologic fracture. Electronically Signed   By: Ulyses Southward M.D.   On: 09/23/2021 15:43   US RENAL  Result Date: 09/18/2021 CLINICAL DATA:  Acute kidney injury EXAM: RENAL / URINARY TRACT ULTRASOUND COMPLETE COMPARISON:  None Available. FINDINGS: Right Kidney: Renal measurements: 9.8 x 4.3 x 4.6 cm = volume: 102.4 mL. Echogenicity within normal limits. No mass or hydronephrosis visualized. Left Kidney: Renal measurements: 10.4 x 4.4 x 4.2 cm = volume: 100.4 mL. Echogenicity within normal limits. No mass or hydronephrosis visualized. Bladder: Appears normal for degree of bladder distention. Other: None. IMPRESSION: Negative examination Electronically Signed  By: Jasmine Pang M.D.   On: 09/18/2021 21:28   DG CHEST PORT 1 VIEW  Result Date: 09/21/2021 CLINICAL DATA:  Dyspnea EXAM: PORTABLE CHEST 1 VIEW COMPARISON:  08/08/2021 FINDINGS: Stable  cardiomegaly. Aortic atherosclerosis. Unchanged calcified intrathoracic lymph nodes and scattered granulomas. No focal airspace consolidation, pleural effusion, or pneumothorax. Lytic/permeative appearance of the distal right clavicle. IMPRESSION: 1. No acute cardiopulmonary abnormality. 2. Lytic/permeative appearance of the distal right clavicle, suspicious for osseous metastatic disease. Electronically Signed   By: Duanne Guess D.O.   On: 09/21/2021 12:14   US THYROID  Result Date: 09/22/2021 CLINICAL DATA:  Other.  Elevated TSH. EXAM: THYROID ULTRASOUND TECHNIQUE: Ultrasound examination of the thyroid gland and adjacent soft tissues was performed. COMPARISON:  None Available. FINDINGS: Parenchymal Echotexture: Normal Isthmus: 0.4 cm Right lobe: 3.8 x 1.6 x 1.5 cm Left lobe: 4.0 x 1.8 x 1.5 cm _________________________________________________________ Estimated total number of nodules >/= 1 cm: 1 Number of spongiform nodules >/=  2 cm not described below (TR1): 0 Number of mixed cystic and solid nodules >/= 1.5 cm not described below (TR2): 0 _________________________________________________________ Nodule # 1: Location: Right; Superior Maximum size: 1.1 cm; Other 2 dimensions: 0.7 x 1.0 cm Composition: mixed cystic and solid (1) Echogenicity: isoechoic (1) Shape: not taller-than-wide (0) Margins: ill-defined (0) Echogenic foci: none (0) ACR TI-RADS total points: 2. ACR TI-RADS risk category: TR2 (2 points). ACR TI-RADS recommendations: This nodule does NOT meet TI-RADS criteria for biopsy or dedicated follow-up. _________________________________________________________ Nodule # 2: Location: Left; Inferior Maximum size: 0.5 cm; Other 2 dimensions: 0.4 x 0.4 cm Composition: solid/almost completely solid (2) Echogenicity: hypoechoic (2) Shape: not taller-than-wide (0) Margins: smooth (0) Echogenic foci: none (0) ACR TI-RADS total points: 4. ACR TI-RADS risk category: TR4 (4-6 points). ACR TI-RADS  recommendations: Given size (<0.9 cm) and appearance, this nodule does NOT meet TI-RADS criteria for biopsy or dedicated follow-up. _________________________________________________________ No abnormal lymph nodes identified by ultrasound. IMPRESSION: Normal sized thyroid gland. Two small thyroid nodules do not meet criteria for biopsy or dedicated follow-up. The above is in keeping with the ACR TI-RADS recommendations - J Am Coll Radiol 2017;14:587-595. Electronically Signed   By: Irish Lack M.D.   On: 09/22/2021 11:43   DG FEMUR MIN 2 VIEWS LEFT  Result Date: 09/24/2021 CLINICAL DATA:  Lytic lesions on x-ray. EXAM: LEFT FEMUR 2 VIEWS COMPARISON:  Bone scan 09/23/2021. FINDINGS: Small lytic lesions are seen in the femoral shaft, at the junction of proximal and middle thirds and junction of the middle and distal thirds. The inferior lesion likely corresponds to uptake on yesterday's bone scan. Degenerative changes in the left hip and knee. IMPRESSION: 1. 2 lytic lesions in the femoral shaft, 1 of which likely corresponds to uptake seen on yesterday's bone scan. 2. Left hip and left knee osteoarthritis. Electronically Signed   By: Leanna Battles M.D.   On: 09/24/2021 11:11   DG FEMUR, MIN 2 VIEWS RIGHT  Result Date: 09/24/2021 CLINICAL DATA:  Lytic lesion on x-ray, pain. EXAM: RIGHT FEMUR 2 VIEWS COMPARISON:  Bone scan 09/23/2021. FINDINGS: Right hip arthroplasty. Two vertically oriented lucencies are seen in the femoral shaft, at the junction of the proximal and middle thirds. Additional faint lucency in the distal femoral shaft may correspond to uptake on yesterday's bone scan. Degenerative changes in the right knee are incidentally imaged. IMPRESSION: Lytic lesions in the femur, as described above. Electronically Signed   By: Leanna Battles M.D.   On: 09/24/2021 11:09  CT CHEST ABDOMEN PELVIS WO CONTRAST  Result Date: 09/22/2021 CLINICAL DATA:  Invasive breast cancer, stage IV. Pre therapy or  restaging. EXAM: CT CHEST, ABDOMEN AND PELVIS WITHOUT CONTRAST TECHNIQUE: Multidetector CT imaging of the chest, abdomen and pelvis was performed following the standard protocol without IV contrast. RADIATION DOSE REDUCTION: This exam was performed according to the departmental dose-optimization program which includes automated exposure control, adjustment of the mA and/or kV according to patient size and/or use of iterative reconstruction technique. COMPARISON:  None Available. FINDINGS: CT CHEST FINDINGS Cardiovascular: The heart is enlarged and there is a trace pericardial effusion. Three-vessel coronary artery calcifications are noted. There is atherosclerotic calcification of the aorta without evidence of aneurysm. The pulmonary trunk is mildly distended which may be associated with underlying pulmonary artery hypertension. Mediastinum/Nodes: Calcified mediastinal lymph nodes are noted. No axillary lymphadenopathy. Evaluation of the hila is limited due to lack of IV contrast. The thyroid gland, trachea, and esophagus are within normal limits. There is a small hiatal hernia. Lungs/Pleura: Strandy atelectasis is present bilaterally. No effusion or pneumothorax. A calcified granuloma is noted in the left upper lobe. No discrete pulmonary nodule is seen. Musculoskeletal: Presumed mastectomy changes on the left. Diffuse mixed lytic and sclerotic lesions are seen within the bones, likely representing metastatic disease. There is a compression deformity in the inferior endplate at T11 which is indeterminate in age. CT ABDOMEN PELVIS FINDINGS Hepatobiliary: The liver has a slightly lobular contour. An indeterminate hypodensity is present in the right lobe of the liver measuring 1.2 cm, axial image 54. Stones are present within the gallbladder. No biliary ductal dilatation. Pancreas: Pancreatic atrophy is noted. No pancreatic ductal dilatation or surrounding fat stranding. Spleen: Normal size.  Scattered calcified  granuloma are noted. Adrenals/Urinary Tract: The adrenal glands are within normal limits. No renal calculus or hydronephrosis bilaterally. The bladder is unremarkable. Stomach/Bowel: There is gastric wall thickening of the proximal stomach, which may be in part due to limited distention. No bowel obstruction, free air, or pneumatosis. There is diastasis of the rectus abdominus with a broad-based hernia containing nonobstructed bowel. Scattered diverticula are present along the colon without evidence of diverticulitis. A moderate amount of retained stool is noted in the colon which may be associated with constipation. A normal appendix is present in the right lower quadrant. Vascular/Lymphatic: Aortic atherosclerosis. No enlarged abdominal or pelvic lymph nodes. Reproductive: Enlarged uterus with multiple calcified fibroids. No adnexal mass. Other: No abdominopelvic ascites. Musculoskeletal: A lipoma is noted in the abductor muscles in the proximal left thigh. Mixed lytic and sclerotic lesions are noted throughout the bones. Total hip arthroplasty changes are noted on the right. Degenerative changes are present in the thoracolumbar spine. No acute fracture is identified. IMPRESSION: 1. Diffuse lytic and sclerotic lesions in the bones, compatible with metastatic disease. There is a mild compression deformity in the inferior endplate of T11 which is indeterminate in age. The possibility of pathologic fracture can not be completely excluded. 2. Indeterminate hypodensity in the posterior right lobe of the liver measuring 1.2 cm. Multiphase CT or MRI is suggested for further evaluation. 3. Cholelithiasis. 4. Diverticulosis without diverticulitis. 5. Gastric wall thickening of the proximal stomach, which may be in part due to underdistention. The possibility gastritis or metastatic disease can not be completely excluded. Upper GI may be beneficial for further evaluation. 6. Uterine fibroids. 7. Aortic atherosclerosis. 8.  Cardiomegaly with multi-vessel coronary artery calcifications. Electronically Signed   By: Thornell Sartorius M.D.   On: 09/22/2021  23:10   US Abdomen Limited RUQ (LIVER/GB)  Result Date: 09/18/2021 CLINICAL DATA:  Increased transaminase level. EXAM: ULTRASOUND ABDOMEN LIMITED RIGHT UPPER QUADRANT COMPARISON:  None Available. FINDINGS: Gallbladder: Multiple small layering gallstones are present measuring up to 8 mm. There is no gallbladder wall thickening. No sonographic Murphy sign noted by sonographer. Common bile duct: Diameter: 4.2 mm Liver: No focal lesion identified. Within normal limits in parenchymal echogenicity. Portal vein is patent on color Doppler imaging with normal direction of blood flow towards the liver. Other: None. IMPRESSION: 1. Cholelithiasis. No additional sonographic evidence for acute cholecystitis. Electronically Signed   By: Darliss Cheney M.D.   On: 09/18/2021 19:39    Labs:  CBC: Recent Labs    09/19/21 0427 09/21/21 0721 09/22/21 0452 09/24/21 0354  WBC 10.0 10.4 9.2 10.5  HGB 9.1* 9.0* 8.9* 8.5*  HCT 29.7* 29.8* 29.1* 27.2*  PLT 271 269 259 236    COAGS: Recent Labs    06/22/21 0941 06/22/21 2015 06/23/21 0807 06/24/21 0550 09/18/21 2103  INR  --   --  1.2  --  1.9*  APTT 60* 66* 94* 71*  --     BMP: Recent Labs    09/21/21 0721 09/22/21 0452 09/23/21 0536 09/24/21 0354  NA 140 138 137 138  K 3.8 3.9 3.4* 4.6  CL 103 101 100 102  CO2 27 25 28 28   GLUCOSE 140* 142* 126* 138*  BUN 26* 29* 31* 33*  CALCIUM 10.7* 10.4* 10.0 10.0  CREATININE 2.03* 2.13* 2.04* 2.00*  GFRNONAA 26* 24* 25* 26*    LIVER FUNCTION TESTS: Recent Labs    09/21/21 0721 09/22/21 0452 09/23/21 0536 09/24/21 0354  BILITOT 0.7 0.9 0.5 0.6  AST 163* 123* 84* 70*  ALT 113* 106* 92* 88*  ALKPHOS 78 90 90 88  PROT 6.6 6.7 6.4* 6.2*  ALBUMIN 2.5* 2.5* 2.3* 2.2*    TUMOR MARKERS: No results for input(s): AFPTM, CEA, CA199, CHROMGRNA in the last 8760  hours.  Assessment and Plan: History of HFrEF, stage IV breast cancer s/p left breast mastectomy (2008), CAD, CKD stage III, DM II and A fib. Pt was admitted for severe hypokalemia induced rhabdomyolysis. Pt had MRI brain 09/19/21 that suspicious metastatic osseous disease. Pt had bone scan 09/23/21 that showed numerous scattered osseous metastases. Pt was referred to IR for bone lesion biopsy by Clenton Pare, NP.  Dr. Fredia Sorrow approved iliac lesion biopsy.   NM Bone scan 09/23/21: IMPRESSION: Numerous sites of abnormal tracer uptake consistent with scattered osseous metastases, including sites at the distal femoral diaphyses bilaterally and adjacent to the femoral component of the RIGHT hip prosthesis; recommend radiographic correlation of BILATERAL femora to exclude lesions at risk for pathologic fracture.  Pt resting in bed. She is A&O, calm and pleasant.  She is in no distress.  Pt is aware that she will be NPO after MN.  Pt last Eliquis was 5/8.   Risks and benefits of bone lesion biopsy with moderate sedation was discussed with the patient and/or patient's family including, but not limited to bleeding, infection, damage to adjacent structures or low yield requiring additional tests.  All of the questions were answered and there is agreement to proceed.  Consent signed and in chart.   Thank you for this interesting consult.  I greatly enjoyed meeting Stephanie Franco and look forward to participating in their care.  A copy of this report was sent to the requesting provider on this date.  Electronically  Signed: Shon Hough, NP 09/24/2021, 12:27 PM   I spent a total of 20 minutes in face to face in clinical consultation, greater than 50% of which was counseling/coordinating care for bone lesion biopsy.

## 2021-09-24 NOTE — Progress Notes (Incomplete)
Patient's husband requesting for tylenol,informed patient and husband that this RN already gave it at 20:14 ?

## 2021-09-24 NOTE — Progress Notes (Signed)
Mobility Specialist: Progress Note ? ? 09/24/21 1711  ?Mobility  ?Activity  ?(Bed exercises)  ?Level of Assistance Independent after set-up  ?Assistive Device None  ?Activity Response Tolerated well  ?$Mobility charge 1 Mobility  ? ?Pt declined OOB but agreeable to LE exercises in the bed. No c/o throughout. Will f/u tomorrow and attempt ambulation.  ? ?Stephanie Franco ?Mobility Specialist ?Mobility Specialist [place]: [phone] ?Mobility Specialist [place]: [phone] ? ?

## 2021-09-25 ENCOUNTER — Inpatient Hospital Stay (HOSPITAL_COMMUNITY): Payer: Medicare Other

## 2021-09-25 LAB — COMPREHENSIVE METABOLIC PANEL
ALT: 80 U/L — ABNORMAL HIGH (ref 0–44)
AST: 61 U/L — ABNORMAL HIGH (ref 15–41)
Albumin: 2.1 g/dL — ABNORMAL LOW (ref 3.5–5.0)
Alkaline Phosphatase: 86 U/L (ref 38–126)
Anion gap: 6 (ref 5–15)
BUN: 30 mg/dL — ABNORMAL HIGH (ref 8–23)
CO2: 30 mmol/L (ref 22–32)
Calcium: 9.8 mg/dL (ref 8.9–10.3)
Chloride: 103 mmol/L (ref 98–111)
Creatinine, Ser: 1.93 mg/dL — ABNORMAL HIGH (ref 0.44–1.00)
GFR, Estimated: 27 mL/min — ABNORMAL LOW (ref >60)
Glucose, Bld: 144 mg/dL — ABNORMAL HIGH (ref 70–99)
Potassium: 4.3 mmol/L (ref 3.5–5.1)
Sodium: 139 mmol/L (ref 135–145)
Total Bilirubin: 0.6 mg/dL (ref 0.3–1.2)
Total Protein: 6.2 g/dL — ABNORMAL LOW (ref 6.5–8.1)

## 2021-09-25 LAB — IRON AND TIBC
Iron: 29 ug/dL (ref 28–170)
Saturation Ratios: 9 % — ABNORMAL LOW (ref 10.4–31.8)
TIBC: 319 ug/dL (ref 250–450)
UIBC: 290 ug/dL

## 2021-09-25 LAB — GLUCOSE, CAPILLARY
Glucose-Capillary: 157 mg/dL — ABNORMAL HIGH (ref 70–99)
Glucose-Capillary: 123 mg/dL — ABNORMAL HIGH (ref 70–99)
Glucose-Capillary: 133 mg/dL — ABNORMAL HIGH (ref 70–99)
Glucose-Capillary: 132 mg/dL — ABNORMAL HIGH (ref 70–99)

## 2021-09-25 LAB — CBC
HCT: 26.1 % — ABNORMAL LOW (ref 36.0–46.0)
Hemoglobin: 7.9 g/dL — ABNORMAL LOW (ref 12.0–15.0)
MCH: 27.8 pg (ref 26.0–34.0)
MCHC: 30.3 g/dL (ref 30.0–36.0)
MCV: 91.9 fL (ref 80.0–100.0)
Platelets: 237 10*3/uL (ref 150–400)
RBC: 2.84 MIL/uL — ABNORMAL LOW (ref 3.87–5.11)
RDW: 16.7 % — ABNORMAL HIGH (ref 11.5–15.5)
WBC: 9.3 10*3/uL (ref 4.0–10.5)
nRBC: 0.4 % — ABNORMAL HIGH (ref 0.0–0.2)

## 2021-09-25 LAB — INSULIN-LIKE GROWTH FACTOR: Somatomedin C: 144 ng/mL (ref 48–191)

## 2021-09-25 LAB — FERRITIN: Ferritin: 442 ng/mL — ABNORMAL HIGH (ref 11–307)

## 2021-09-25 MED ORDER — MIDAZOLAM HCL 2 MG/2ML IJ SOLN
INTRAMUSCULAR | Status: AC | PRN
Start: 2021-09-25 — End: 2021-09-25
  Administered 2021-09-25 (×2): .5 mg via INTRAVENOUS
  Administered 2021-09-25: 1 mg via INTRAVENOUS

## 2021-09-25 MED ORDER — SODIUM CHLORIDE 0.9 % IV SOLN: 510.0000 mg | Freq: Once | INTRAVENOUS | Status: DC

## 2021-09-25 MED ORDER — MIDAZOLAM HCL 2 MG/2ML IJ SOLN
INTRAMUSCULAR | Status: AC
  Filled 2021-09-25: qty 2

## 2021-09-25 MED ORDER — TORSEMIDE 20 MG PO TABS
20.0000 mg | ORAL_TABLET | Freq: Two times a day (BID) | ORAL | Status: DC
  Administered 2021-09-25 – 2021-09-26 (×2): 20 mg via ORAL
  Filled 2021-09-25 (×2): qty 1

## 2021-09-25 MED ORDER — FENTANYL CITRATE (PF) 100 MCG/2ML IJ SOLN
INTRAMUSCULAR | Status: AC
  Filled 2021-09-25: qty 2

## 2021-09-25 MED ORDER — LIDOCAINE-EPINEPHRINE 1 %-1:100000 IJ SOLN
INTRAMUSCULAR | Status: AC
  Filled 2021-09-25: qty 1

## 2021-09-25 MED ORDER — SODIUM CHLORIDE 0.9 % IV SOLN
250.0000 mg | Freq: Every day | INTRAVENOUS | Status: AC
  Administered 2021-09-25 – 2021-09-26 (×2): 250 mg via INTRAVENOUS
  Filled 2021-09-25 (×2): qty 20

## 2021-09-25 MED ORDER — FENTANYL CITRATE (PF) 100 MCG/2ML IJ SOLN
INTRAMUSCULAR | Status: AC | PRN
  Administered 2021-09-25 (×2): 25 ug via INTRAVENOUS
  Administered 2021-09-25 (×2): 50 ug via INTRAVENOUS

## 2021-09-25 NOTE — Progress Notes (Addendum)
? ?Subjective: ?Overnight events: Patient was febrile up to 101.4F and tachycardic 136 BPM. CXR showed vascular congestion and patient received 1 dose of IV Lasix for diuresis. She remained on 2L nasal cannula.  ? ?Ms. Zani is a 71 y.o. female with PMH significant for HFrEF (35%), breast cancer s/p left breast mastectomy in 2008, persistent atrial fibrillation who was admitted for severe hypokalemia induced rhabdomyolysis and incidental finding of metastatic disease. This morning she states she is feeling improved and was able to sleep well. Her shortness of breath has improved. She denies any chest pain or palpitations. No nausea, vomiting, or abdominal pain. She had one bowel movement yesterday.  ? ?Objective: ? ?Vital signs in last 24 hours: ?Vitals:  ? 09/25/21 0246 09/25/21 0351 09/25/21 0424 09/25/21 0856  ?BP: 103/67  111/71 107/66  ?Pulse: 94  67 95  ?Resp: 18  18 19   ?Temp: 98.2 ?F (36.8 ?C)  98.5 ?F (36.9 ?C) 100 ?F (37.8 ?C)  ?TempSrc: Oral  Oral Oral  ?SpO2: 100%  100% 100%  ?Weight:  84.6 kg    ?Height:      ? ?Weight change:  ? ?Intake/Output Summary (Last 24 hours) at 09/25/2021 1143 ?Last data filed at 09/25/2021 0700 ?Gross per 24 hour  ?Intake 780 ml  ?Output 300 ml  ?Net 480 ml  ? ?Physical Exam  ?Constitutional:  resting comfortably in no acute distress. Alert and oriented x3.  ?Cardiovascular:  Irregularly irregular. No murmurs, rubs, or gallop noted on exam. Trace pitting edema noted bilaterally, more on right.  ?Respiratory:  Normal work of breathing on 2L of nasal cannula. No accessory muscle use. Lungs are clear to auscultation.  ?Abdominal:  No abdominal tenderness to palpation. Soft and non-distended. Normal bowel sounds present.  ?Extremities: no asymmetry noted on the extremities.  ?Skin:  No obvious lesions. Skin is warm and dry.  ?Psych:  Normal mood and behavior.  ? ? ?Assessment/Plan: ? ?Principal Problem: ?  AKI (acute kidney injury) ([place]) ? ? ?Patient is a 71 y.o. female with PMH  significant for breast cancer s/p left breast mastectomy in 2008, HFrEF with EF of 35%, persistent atrial fibrillation, who was admitted for severe hypokalemia induced rhabdomyolysis as well as incidental finding of osseous metastatic disease.  ? 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 Cyclical episodes of fever likely from her metastatic disease; no evidence of infectious process with CXR showing vascular congestion and no leukocytosis. Patient's hemoglobin trending downward since her admission. Iron study shows elevated ferritin but low saturation which may be related to her metastatic disease. Patient has been followed by oncology/Dr Learta Codding with plans for OP follow up in place. She is stable and plan is for DC to home tmrw morning.   ?-Continue ferrous sulfate supplement and ordered IV iron today. ?-Tylenol PRN and oxycodone IR 10 mg TID prn for severe pain. Bowel regimen in place.  ?-Appreciate oncology colleagues. Plan for follow-up at the [facility] the week of 5/22.  ?-Pending PTH-related peptide.  ?-Follow up repeat blood cultures  ? ?Severe hypokalemia induced rhabdomyolysis, resolved ?AKI on CKD3b, resolved ?Stable weakness likely 2/2 deconditioning d/t >1 week hospital stay. Potassium stable at 4.6. She does have evidence of vascular congestion noted on CXR. Renal function back to baseline. ?-Increased torsemide from 10 mg BID to 20 mg BID (home dose) given fluid overloaded  on imaging and O2 requirement.  ?-Continue Kdur 40 BID while patient is on Torsemide ?-Continue monitoring BMP and replete K as needed ?-Follow-up on renin/aldosteronism ratio  ? ?Chronic HFrEF (EF 35%) ?Stable shortness of breath with breathing improved on 2L O2. Mild bilateral lower extremity edema, stable from prior exam. Weight has been stable. CXR did show  vascular congestion--will increase torsemide back to home dose.  ?-Increase Torsemide back up to home dose at 20 mg BID.  ?-Continue monitoring BMP closely and provide with potassium supplementation.  ?-Monitor I/O's and weight ? ?Transaminitis, resolving  ?HLD ?Likely from severe hypokalemia induced rhabdomyolysis. LFT continuing to improve.  ?-Trend CMP ?-Continue holding rosuvastatin ?-Continue Zetia  ? ?Insomnia  ?Hx of anxiety  ?Patient was able to sleep better after her Ativan was restarted. She reports no anxiety or restlessness.  ?-Continue home Ativan 1 mg BID ? ?Persistent atrial fibrillation  ?Chronic and stable. Restart Eliquis tmrw.  ? ?HTN ?Stable and normotensive ?-Continue home Imdur 60 mg daily ? ?T2DM ?Blood glucose has been stable ?-Continue Semglee and SSI ? ? LOS: 6 days  ? ?Clydell Hakim, Medical Student ?09/25/2021, 11:43 AM ? ?Pager number: [phone]  ? ? ?Attestation for Student Documentation: ? ?I personally was present and performed or re-performed the history, physical exam and medical decision-making activities of this service and have verified that the service and findings are accurately documented in the student?s note. ? ?Lajean Manes, MD ?09/25/2021, 3:54 PM ? ?

## 2021-09-25 NOTE — Progress Notes (Signed)
Paged by RN for tachycardia with heart rate in the 130s and fever 101.1.  ? ?When evaluated bedside, patient appears lethargic but not in acute respiratory distress.  She is on 2 L nasal cannula and satting in the 99%.  Blood pressure normal.  She does not use oxygen at home.  She started needing oxygen yesterday for feeling short of breath after using the restroom. ? ?Patient reports feeling warm and tired since this afternoon.  She denies shortness of breath but endorses orthopnea.  No chest pain.  No abdominal pain, constipation or diarrhea.  No dysuria or urinary frequency. ? ?Physical exam unrevealing.  Lungs are clear bilaterally.  Heart rate was high but regular.  No murmur.  Abdomen mildly distended but nontender to palpation.  Bowel sound present.  No edema bilateral lower extremity, no pain to palpation, no erythema or warmth. ? ?Chest x-ray obtained given new oxygen requirement and tachycardia, which showed increased vascular congestion.  No consolidation or pleural effusion seen.  Will give 1 dose of IV Lasix 20 mg to help with diuresis tonight.   ? ?Blood culture obtained for her fever.  Unclear cause of her fever.  She has been having low-grade fever every night for the past 3 nights.  Blood culture obtained on 5/7 was negative.  No consolidations on chest x-ray.  Her underlying malignancy can be the cause of this intermittent fever if negative blood culture. ?

## 2021-09-25 NOTE — Procedures (Signed)
Pre procedural Dx: History of breast cancer, now with multiple lytic osseous lesions ? ?Post procedural Dx: Same ? ?Technically successful CT guided biopsy of lytic lesion involving the posterior aspect of the left ilium. ?  ?EBL: None.  ?Complications: None immediate.  ? ?Ronny Bacon, MD ?Pager #: [phone] ? ? ? ?

## 2021-09-25 NOTE — Discharge Instructions (Addendum)
PER MEDICAL ONCOLOGY: Follow up in office on 10/02/21 at 1:15 pm for lab/and visit with Ned Card, NP and Dr. Benay Spice to discuss biopsy results and determine treatment plan as indicated. Call [phone] with any questions or concerns. ? ?Stephanie Franco you were admitted to [facility] Internal Medicine Service because of very low potassium, kidney injury, metastatic cancer. We did several labs and tests to rule out many life threatening things, as well as to determine the cause of your symptoms.  ? ?We treated you by giving you potassium and medications to help with this. We consulted oncology to help you get established for further workup and care. Please follow up with oncology on 5/18 at 1:15 pm. You are improved significantly with this treatment, and we continued to monitor you until you were stable enough for discharge.  ? ?PLEASE continue to take your medications as prescribed by your doctor, with some notable changes including: start taking Spironolactone as well as Potassium supplementation as listed on your discharge.  ? ?PLEASE do not miss any doses of your medications as it is very important in ensuring you continue to feel better and remain stable.  ? ?You need to follow up with your primary care doctor by calling their office within the next week to create an appointment to discuss the events of this hospitalization, and to determine the best management plan for you and your conditions, to prevent you from having to return to the hospital. ADDITIONALLY, please go to any other appointments made for you in this discharge information. ? ?RETURN to the ED if you have similar or worsening symptoms, and do not feel like your normal self.  ? ?

## 2021-09-26 ENCOUNTER — Other Ambulatory Visit (HOSPITAL_COMMUNITY): Payer: Self-pay

## 2021-09-26 LAB — CBC
HCT: 27.5 % — ABNORMAL LOW (ref 36.0–46.0)
Hemoglobin: 8.5 g/dL — ABNORMAL LOW (ref 12.0–15.0)
MCH: 28.4 pg (ref 26.0–34.0)
MCHC: 30.9 g/dL (ref 30.0–36.0)
MCV: 92 fL (ref 80.0–100.0)
Platelets: 251 10*3/uL (ref 150–400)
RBC: 2.99 MIL/uL — ABNORMAL LOW (ref 3.87–5.11)
RDW: 16.9 % — ABNORMAL HIGH (ref 11.5–15.5)
WBC: 8.3 10*3/uL (ref 4.0–10.5)
nRBC: 0.4 % — ABNORMAL HIGH (ref 0.0–0.2)

## 2021-09-26 LAB — CULTURE, BLOOD (ROUTINE X 2)
Culture: NO GROWTH
Culture: NO GROWTH
Special Requests: ADEQUATE

## 2021-09-26 LAB — COMPREHENSIVE METABOLIC PANEL
ALT: 80 U/L — ABNORMAL HIGH (ref 0–44)
AST: 66 U/L — ABNORMAL HIGH (ref 15–41)
Albumin: 2.2 g/dL — ABNORMAL LOW (ref 3.5–5.0)
Alkaline Phosphatase: 86 U/L (ref 38–126)
Anion gap: 10 (ref 5–15)
BUN: 27 mg/dL — ABNORMAL HIGH (ref 8–23)
CO2: 28 mmol/L (ref 22–32)
Calcium: 9.6 mg/dL (ref 8.9–10.3)
Chloride: 101 mmol/L (ref 98–111)
Creatinine, Ser: 1.68 mg/dL — ABNORMAL HIGH (ref 0.44–1.00)
GFR, Estimated: 32 mL/min — ABNORMAL LOW (ref >60)
Glucose, Bld: 119 mg/dL — ABNORMAL HIGH (ref 70–99)
Potassium: 3.8 mmol/L (ref 3.5–5.1)
Sodium: 139 mmol/L (ref 135–145)
Total Bilirubin: 0.8 mg/dL (ref 0.3–1.2)
Total Protein: 6.3 g/dL — ABNORMAL LOW (ref 6.5–8.1)

## 2021-09-26 LAB — GLUCOSE, CAPILLARY
Glucose-Capillary: 101 mg/dL — ABNORMAL HIGH (ref 70–99)
Glucose-Capillary: 143 mg/dL — ABNORMAL HIGH (ref 70–99)
Glucose-Capillary: 162 mg/dL — ABNORMAL HIGH (ref 70–99)

## 2021-09-26 MED ORDER — POLYETHYLENE GLYCOL 3350 17 GM/SCOOP PO POWD
17.0000 g | Freq: Every day | ORAL | 0 refills | Status: DC
  Filled 2021-09-26: qty 238, 14d supply, fill #0

## 2021-09-26 MED ORDER — SPIRONOLACTONE 25 MG PO TABS
25.0000 mg | ORAL_TABLET | Freq: Every day | ORAL | 0 refills | Status: AC
Start: 2021-09-26 — End: ?
  Filled 2021-09-26: qty 30, 30d supply, fill #0

## 2021-09-26 MED ORDER — POTASSIUM CHLORIDE CRYS ER 20 MEQ PO TBCR
40.0000 meq | EXTENDED_RELEASE_TABLET | Freq: Two times a day (BID) | ORAL | 0 refills | Status: DC
Start: 2021-09-26 — End: 2021-10-09
  Filled 2021-09-26: qty 120, 30d supply, fill #0

## 2021-09-26 MED ORDER — SENNOSIDES-DOCUSATE SODIUM 8.6-50 MG PO TABS
1.0000 | ORAL_TABLET | Freq: Two times a day (BID) | ORAL | 0 refills | Status: AC
  Filled 2021-09-26: qty 60, 30d supply, fill #0

## 2021-09-26 MED ORDER — ONDANSETRON HCL 4 MG PO TABS
4.0000 mg | ORAL_TABLET | Freq: Four times a day (QID) | ORAL | 0 refills | Status: AC | PRN
  Filled 2021-09-26: qty 20, 8d supply, fill #0

## 2021-09-26 MED ORDER — APIXABAN 5 MG PO TABS
5.0000 mg | ORAL_TABLET | Freq: Two times a day (BID) | ORAL | Status: DC
  Administered 2021-09-26: 5 mg via ORAL
  Filled 2021-09-26: qty 1

## 2021-09-26 NOTE — TOC Transition Note (Signed)
Transition of Care (TOC) - CM/SW Discharge Note ? ? ?Patient Details  ?Name: Stephanie Franco ?MRN: [id number] ?Date of Birth: 06/09/1950 ? ?Transition of Care (TOC) CM/SW Contact:  ?Tom-Johnson, Renea Ee, RN ?Phone Number: ?09/26/2021, 11:24 AM ? ? ?Clinical Narrative:    ? ?Patient is scheduled for discharge today. Husband notified CM today that they have decided to resume home health disciplines with [facility]. Referral sent to [facility] and acceptance voiced.Info on AVS. Husband also requested a bsc and electric scooter. Order placed and bsc to be delivered to patient's home. Jodell Cipro with [employer] notified CM and patient's husband that patient would have to pay out of pocket for the electric scooter.  ?Husband to transport at discharge. No further TOC needs noted. ? ? ?Final next level of care: [place] ?Barriers to Discharge: Barriers Resolved ? ? ?Patient Goals and CMS Choice ?Patient states their goals for this hospitalization and ongoing recovery are:: To return home. ?CMS Medicare.gov Compare Post Acute Care list provided to:: Patient ?Choice offered to / list presented to : Patient, Spouse ? ?Discharge Placement ?  ?           ?  ?Patient to be transferred to facility by: Husband ?  ?  ? ?Discharge Plan and Services ?  ?Discharge Planning Services: CM Consult ?Post Acute Care Choice: Home Health (Declined.)          ?DME Arranged: 3-N-1, Other see comment Patent attorney.) ?DME Agency: [employer] ?Date DME Agency Contacted: 09/26/21 ?Time DME Agency Contacted: [id number] ?Representative spoke with at DME Agency: Jodell Cipro ?HH Arranged: RN, PT, OT ?Icard Agency: [place] ([place]) ?Date HH Agency Contacted: 09/26/21 ?Time [place]: 3810 ?Representative spoke with at [place]: Corene Cornea ? ?Social Determinants of Health (SDOH) Interventions ?  ? ? ?Readmission Risk Interventions ?   ? View : No data to display.  ?  ?  ?  ? ? ? ? ? ?

## 2021-09-26 NOTE — Progress Notes (Signed)
Discharge: Pt d/c from room via wheelchair, Family member with the pt. Discharge instructions given to the patient and family members.  No questions from pt, reintegrated to the pt to call or go to the ED for chest discomfort. Pt dressed in street clothes and left with discharge papers and prescriptions in hand. IV d/ced, tele removed and no complaints of pain or discomfort. 

## 2021-09-26 NOTE — Progress Notes (Deleted)
? ?Name: Stephanie Franco ?MRN: [id number] ?DOB: 1950/05/30 71 y.o. ?PCP: Lin Landsman, MD ? ?Date of Admission: 09/18/2021  1:26 PM ?Date of Discharge:  09/26/21 ?Attending Physician: Dr. Philipp Ovens ? ?DISCHARGE DIAGNOSIS:  ?Primary Problem: AKI (acute kidney injury) ([place])  ? ?Hospital Problems: ?Principal Problem: ?  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 ?DISCHARGE MEDICATIONS:  ? ?Allergies as of 09/26/2021   ?No Known Allergies ?  ? ?  ?Medication List  ?  ? ?TAKE these medications   ? ?acetaminophen 500 MG tablet ?Commonly known as: TYLENOL ?Take 1,000 mg by mouth every 6 (six) hours as needed for mild pain. ?  ?albuterol 108 (90 Base) MCG/ACT inhaler ?Commonly known as: VENTOLIN HFA ?Inhale 1-2 puffs into the lungs every 6 (six) hours as needed for wheezing or shortness of breath. ?  ?albuterol (2.5 MG/3ML) 0.083% nebulizer solution ?Commonly known as: PROVENTIL ?Take 3 mLs (2.5 mg total) by nebulization every 6 (six) hours as needed for wheezing or shortness of breath. ?  ?allopurinol 300 MG tablet ?Commonly known as: ZYLOPRIM ?Take 300 mg by mouth daily. ?  ?CLEAR EYES MAX REDNESS RELIEF OP ?Place 2 drops into both eyes daily as needed (redness). ?  ?dapagliflozin propanediol 10 MG Tabs tablet ?Commonly known as: FARXIGA ?Take 1 tablet (10 mg total) by mouth daily. ?  ?Eliquis 5 MG Tabs tablet ?Generic drug: apixaban ?TAKE 1 TABLET BY MOUTH TWICE A DAY ?What changed: how much to take ?  ?ezetimibe 10 MG tablet ?Commonly known as: ZETIA ?Take 1 tablet (10 mg total) by mouth daily. ?  ?fexofenadine 180 MG tablet ?Commonly known as: ALLEGRA ?Take 180 mg by mouth daily as needed for allergies. ?  ?fluticasone 50 MCG/ACT nasal spray ?Commonly known as: FLONASE ?Place 2 sprays into the nose daily as needed for allergies or rhinitis. ?  ?gabapentin 600 MG  tablet ?Commonly known as: NEURONTIN ?Take 300 mg by mouth daily. ?  ?glucose blood test strip ?1 each by Other route as needed for other. Use as instructed ?  ?isosorbide mononitrate 60 MG 24 hr tablet ?Commonly known as: IMDUR ?Take 1 tablet (60 mg total) by mouth daily. ?  ?LORazepam 1 MG tablet ?Commonly known as: ATIVAN ?Take 1 mg by mouth 2 (two) times daily. ?  ?losartan 25 MG tablet ?Commonly known as: COZAAR ?Take 1/2 tablet (12.5 mg total) by mouth daily. ?  ?multivitamin with minerals tablet ?Take 1 tablet by mouth daily. ?  ?nitroGLYCERIN 0.4 MG SL tablet ?Commonly known as: NITROSTAT ?DISSOLVE 1 TABLET UNDER THE TONGUE EVERY 5 MINUTES AS NEEDED FOR CHEST PAIN. ?What changed: See the new instructions. ?  ?ondansetron 4 MG tablet ?Commonly known as: ZOFRAN ?Take 1 tablet (4 mg total) by mouth every 6 (six) hours as needed for nausea. ?  ?OVER THE COUNTER MEDICATION ?Apply 1 application. topically daily as needed (pain). Apply to legs and feet ?  ?Oxycodone HCl 10 MG Tabs ?Take 5 mg by mouth 3 (three) times daily as needed for pain. ?  ?pantoprazole 40 MG tablet ?Commonly known as: PROTONIX ?TAKE 1 TABLET BY MOUTH EVERY DAY ?  ?polyethylene glycol powder 17 GM/SCOOP powder ?Commonly known as: GLYCOLAX/MIRALAX ?Take 17 g by mouth daily. ?  ?potassium chloride SA 20 MEQ tablet ?Commonly known as: KLOR-CON M ?Take 2 tablets (40 mEq total) by mouth 2 (two) times daily. ?  ?  rosuvastatin 40 MG tablet ?Commonly known as: CRESTOR ?Take 1 tablet (40 mg total) by mouth daily. ?  ?Senexon-S 8.6-50 MG tablet ?Generic drug: senna-docusate ?Take 1 tablet by mouth 2 (two) times daily. ?  ?spironolactone 25 MG tablet ?Commonly known as: ALDACTONE ?Take 1 tablet (25 mg total) by mouth daily. ?  ?torsemide 20 MG tablet ?Commonly known as: DEMADEX ?Take 20 mg by mouth 2 (two) times daily. ?  ?Toujeo SoloStar 300 UNIT/ML Solostar Pen ?Generic drug: insulin glargine (1 Unit Dial) ?Inject 30 Units into the skin 2 (two) times  daily. ?  ?triamcinolone ointment 0.5 % ?Commonly known as: KENALOG ?Apply to legs 2 times daily for 2 weeks ?What changed:  ?how much to take ?how to take this ?when to take this ?reasons to take this ?additional instructions ?  ? ?  ? ?  ?  ? ? ?  ?Durable Medical Equipment  ?(From admission, onward)  ?  ? ? ?  ? ?  Start     Ordered  ? 09/26/21 1015  For home use only DME 3 n 1  Once       ? 09/26/21 1014  ? ?  ?  ? ?  ? ? ?DISPOSITION AND FOLLOW-UP:  ?Stephanie Franco was discharged from [facility] in stable condition. At the hospital follow up visit please address: ? ?Follow-up Recommendations: ?Consults: follow up with Oncology  ?Labs: CMP, CBC, TSH  ?Studies: follow up renin/aldosteronism, PTH-related peptide, and blood cultures from 09/24/21 ?Medications: Continued previous medications; started spironolactone and potassium supplementation at time of discharge. Ensure compliance with medications.  ? ?Follow-up Appointments: ? Follow-up Information   ? ? Lin Landsman, MD. Call today.   ?Specialty: Family Medicine ?Why: Call your PCP to schedule a 1 week post hospital follow up appointment. ?Contact information: ?[address] ?[place] [address] ?[phone] ? ? ?  ?  ? ? [place]. Schedule an appointment as soon as possible for a visit in 6 day(s).   ?Why: Go to your appointment at the cancer center on 10/02/21 at 1:15 pm ?Call the [place] at [phone] if you have any questions or concerns. ? ?  ?  ? ? [facility], [facility] Follow up.   ?Why: Someone will call you to schedule first home visit. ?Contact information: ?[address] ?[place] [address] ?[phone] ? ? ?  ?  ? ?  ?  ? ?  ? ? ?HOSPITAL COURSE:  ?Patient Summary: ?Severe hypokalemia induced rhabdomyolysis ?AKI on CKD 3b ?Patient initially presented for dark urine and generalized weakness and was found to have hypokalemia, down to 2. CK levels were also elevated at  over 4000. She was given gentle fluids in the setting of her heart failure and her potassium was repleted during her hospital admission. Etiology of her potassium was unclear but thought to be related to her torsemide. Held nephrotoxic agents as well due to her AKI. Concern for hyperaldosteronism with renin/aldosterone levels still pending at time of discharge; started 25 mg daily of spironolactone. Potassium levels improved throughout hospital course. Renal function returned to baseline. She was slowly restarted on her home dose of Torsemide and given potassium supplement. Potassium levels were stable at time of discharge.  ? 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 Incidental finding of osseous metastases. She has history of breast cancer s/p left breast mastectomy in 2008. Oncology consulted and bone scan revealed scattered osseous metastases including distal femoral diaphyses bilaterally. CT guided biopsy was performed as well during this admission which she tolerated well. She did develop episodes of cyclic fever during this admission, occurring at night. No leukocytosis and CXR did not show acute pulmonary process. Initial blood cx were also negative. 5/10 blood cultures with NGTD, please follow up. Episodes of fever likely from patient's metastatic disease. S/p IR guided bony met biopsy 09/25/21 -- follow up with oncology on 10/02/21 at 1:15 am. Corrected calcium of 13; suspect this is from bony mets from underlying metastatic malignancy, and plan for bisphosphonate therapy as an OP.  ?Patient to see oncology outpatient at the [facility] for further treatment on 10/02/21 at 1:15 PM; please ensure follow up.  ? ?Chronic HFrEF  ?She has history of HFrEF with EF down to 35% in 06/2021.  Her  Wilder Glade were held during her admission due to her severe hypokalemia and AKI and was given gentle fluids. Torsemide was initially held, but then started back on home dose after developing some SHOB. She did develop some sh

## 2021-09-26 NOTE — Progress Notes (Signed)
Mobility Specialist: Progress Note ? ? 09/26/21 0954  ?Mobility  ?Activity Refused mobility  ? ?Attempted to see pt this AM but pt recently finished ambulating with nursing staff. Will f/u as able. ? ?Harrell Gave Jenesis Suchy ?Mobility Specialist ?Mobility Specialist [place]: [phone] ?Mobility Specialist [place]: [phone] ? ?

## 2021-09-26 NOTE — Progress Notes (Signed)
Nursing dc note ? ?Patient alert and oriented, both patient and husband verbalized understanding of dc instructions.ccmd notified of dc instructions. Piv dcd site unremarkable. ?

## 2021-09-27 NOTE — Discharge Summary (Signed)
? ?Name: Stephanie Franco ?MRN: [id number] ?DOB: 04/24/1951 71 y.o. ?PCP: Lin Landsman, MD ? ?Date of Admission: 09/18/2021  1:26 PM ?Date of Discharge:  09/26/21 ?Attending Physician: Dr. Philipp Ovens ? ?DISCHARGE DIAGNOSIS:  ?Primary Problem: AKI (acute kidney injury) ([place])  ?  ?Hospital Problems: ?Principal Problem: ?  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 ?DISCHARGE MEDICATIONS:  ? ?Allergies as of 09/26/2021   ?No Known Allergies ?  ? ?  ?Medication List  ?  ? ?TAKE these medications   ? ?acetaminophen 500 MG tablet ?Commonly known as: TYLENOL ?Take 1,000 mg by mouth every 6 (six) hours as needed for mild pain. ?  ?albuterol 108 (90 Base) MCG/ACT inhaler ?Commonly known as: VENTOLIN HFA ?Inhale 1-2 puffs into the lungs every 6 (six) hours as needed for wheezing or shortness of breath. ?  ?albuterol (2.5 MG/3ML) 0.083% nebulizer solution ?Commonly known as: PROVENTIL ?Take 3 mLs (2.5 mg total) by nebulization every 6 (six) hours as needed for wheezing or shortness of breath. ?  ?allopurinol 300 MG tablet ?Commonly known as: ZYLOPRIM ?Take 300 mg by mouth daily. ?  ?CLEAR EYES MAX REDNESS RELIEF OP ?Place 2 drops into both eyes daily as needed (redness). ?  ?dapagliflozin propanediol 10 MG Tabs tablet ?Commonly known as: FARXIGA ?Take 1 tablet (10 mg total) by mouth daily. ?  ?Eliquis 5 MG Tabs tablet ?Generic drug: apixaban ?TAKE 1 TABLET BY MOUTH TWICE A DAY ?What changed: how much to take ?  ?ezetimibe 10 MG tablet ?Commonly known as: ZETIA ?Take 1 tablet (10 mg total) by mouth daily. ?  ?fexofenadine 180 MG tablet ?Commonly known as: ALLEGRA ?Take 180 mg by mouth daily as needed for allergies. ?  ?fluticasone 50 MCG/ACT nasal spray ?Commonly known as: FLONASE ?Place 2 sprays into the nose daily as needed for allergies or rhinitis. ?  ?gabapentin 600 MG  tablet ?Commonly known as: NEURONTIN ?Take 300 mg by mouth daily. ?  ?glucose blood test strip ?1 each by Other route as needed for other. Use as instructed ?  ?isosorbide mononitrate 60 MG 24 hr tablet ?Commonly known as: IMDUR ?Take 1 tablet (60 mg total) by mouth daily. ?  ?LORazepam 1 MG tablet ?Commonly known as: ATIVAN ?Take 1 mg by mouth 2 (two) times daily. ?  ?losartan 25 MG tablet ?Commonly known as: COZAAR ?Take 1/2 tablet (12.5 mg total) by mouth daily. ?  ?multivitamin with minerals tablet ?Take 1 tablet by mouth daily. ?  ?nitroGLYCERIN 0.4 MG SL tablet ?Commonly known as: NITROSTAT ?DISSOLVE 1 TABLET UNDER THE TONGUE EVERY 5 MINUTES AS NEEDED FOR CHEST PAIN. ?What changed: See the new instructions. ?  ?ondansetron 4 MG tablet ?Commonly known as: ZOFRAN ?Take 1 tablet (4 mg total) by mouth every 6 (six) hours as needed for nausea. ?  ?OVER THE COUNTER MEDICATION ?Apply 1 application. topically daily as needed (pain). Apply to legs and feet ?  ?Oxycodone HCl 10 MG Tabs ?Take 5 mg by mouth 3 (three) times daily as needed for pain. ?  ?pantoprazole 40 MG tablet ?Commonly known as: PROTONIX ?TAKE 1 TABLET BY MOUTH EVERY DAY ?  ?polyethylene glycol powder 17 GM/SCOOP powder ?Commonly known as: GLYCOLAX/MIRALAX ?Take 17 g by mouth daily. ?  ?potassium chloride SA 20 MEQ tablet ?Commonly known as: KLOR-CON M ?Take 2 tablets (40 mEq total) by mouth 2 (two) times  daily. ?  ?rosuvastatin 40 MG tablet ?Commonly known as: CRESTOR ?Take 1 tablet (40 mg total) by mouth daily. ?  ?Senexon-S 8.6-50 MG tablet ?Generic drug: senna-docusate ?Take 1 tablet by mouth 2 (two) times daily. ?  ?spironolactone 25 MG tablet ?Commonly known as: ALDACTONE ?Take 1 tablet (25 mg total) by mouth daily. ?  ?torsemide 20 MG tablet ?Commonly known as: DEMADEX ?Take 20 mg by mouth 2 (two) times daily. ?  ?Toujeo SoloStar 300 UNIT/ML Solostar Pen ?Generic drug: insulin glargine (1 Unit Dial) ?Inject 30 Units into the skin 2 (two) times  daily. ?  ?triamcinolone ointment 0.5 % ?Commonly known as: KENALOG ?Apply to legs 2 times daily for 2 weeks ?What changed:  ?how much to take ?how to take this ?when to take this ?reasons to take this ?additional instructions ?  ? ?  ? ? ?DISPOSITION AND FOLLOW-UP:  ?Ms.Stephanie Franco was discharged from [facility] in stable condition. At the hospital follow up visit please address: ? ?Follow-up Recommendations: ?Consults: follow up with Oncology  ?Labs: CMP, CBC, TSH  ?Studies: follow up renin/aldosteronism, PTH-related peptide, and blood cultures from 09/24/21 ?Medications: Continued previous medications; started spironolactone and potassium supplementation at time of discharge. Ensure compliance with medications.  ? ?Follow-up Appointments: ? Follow-up Information   ? ? Lin Landsman, MD. Call today.   ?Specialty: Family Medicine ?Why: Call your PCP to schedule a 1 week post hospital follow up appointment. ?Contact information: ?[address] ?[place] [address] ?[phone] ? ? ?  ?  ? ? [place]. Schedule an appointment as soon as possible for a visit in 6 day(s).   ?Why: Go to your appointment at the cancer center on 10/02/21 at 1:15 pm ?Call the [facility] at [phone] if you have any questions or concerns. ? ?  ?  ? ? [facility], [facility] Follow up.   ?Why: Someone will call you to schedule first home visit. ?Contact information: ?[address] ?[place] [address] ?[phone] ? ? ?  ?  ? ?  ?  ? ?  ? ? ?HOSPITAL COURSE:  ?Patient Summary: ?Severe hypokalemia induced rhabdomyolysis ?AKI on CKD 3b ?Patient initially presented for dark urine and generalized weakness and was found to have hypokalemia, down to 2. CK levels were also elevated at over 4000. She was given gentle fluids in the setting of her heart failure and her potassium was repleted during her hospital admission. Etiology of her potassium was unclear but thought to be  related to her torsemide. Held nephrotoxic agents as well due to her AKI. Concern for hyperaldosteronism with renin/aldosterone levels still pending at time of discharge; started 25 mg daily of spironolactone. Potassium levels improved throughout hospital course. Renal function returned to baseline. She was slowly restarted on her home dose of Torsemide and given potassium supplement. Potassium levels were stable at time of discharge.  ? 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 Incidental finding of osseous metastases. She has history of breast cancer s/p left breast mastectomy in 2008. Oncology consulted and bone scan revealed scattered osseous metastases including distal femoral diaphyses bilaterally. CT guided biopsy was performed as well during this admission which she tolerated well. She did develop episodes of cyclic fever during this  admission, occurring at night. No leukocytosis and CXR did not show acute pulmonary process. Initial blood cx were also negative. 5/10 blood cultures with NGTD, please follow up. Episodes of fever likely from patient's metastatic disease. S/p IR guided bony met biopsy 09/25/21 -- follow up with oncology on 10/02/21 at 1:15 am. Corrected calcium of 13; suspect this is from bony mets from underlying metastatic malignancy, and plan for bisphosphonate therapy as an OP.  ?Patient to see oncology outpatient at the [facility] for further treatment on 10/02/21 at 1:15 PM; please ensure follow up.  ? ?Chronic HFrEF  ?She has history of HFrEF with EF down to 35% in 06/2021.  Her Wilder Glade were held during her admission due to her severe hypokalemia and AKI and was given gentle fluids. Torsemide was initially held, but then started back on home dose after developing some  SHOB. She did develop some shortness of breath with ambulation requiring O2 nasal cannula. She was restarted on her Torsemide 20 mg BID for diuresis as well as potassium 40 mEq BID. She was ambulated at time of discharg

## 2021-09-30 ENCOUNTER — Encounter: Payer: Self-pay | Admitting: *Deleted

## 2021-09-30 LAB — CULTURE, BLOOD (ROUTINE X 2)
Culture: NO GROWTH
Culture: NO GROWTH
Special Requests: ADEQUATE
Special Requests: ADEQUATE

## 2021-09-30 LAB — PTH-RELATED PEPTIDE: PTH-related peptide: <2 pmol/L

## 2021-09-30 LAB — ALDOSTERONE + RENIN ACTIVITY W/ RATIO
ALDO / PRA Ratio: 0.9 (ref 0.0–30.0)
Aldosterone: 5.9 ng/dL (ref 0.0–30.0)
PRA LC/MS/MS: 6.341 ng/mL/hr — ABNORMAL HIGH (ref 0.167–5.380)

## 2021-09-30 NOTE — Progress Notes (Signed)
Email request to [facility] for ER/PR, Her-2 breast prognostic panel be completed on case #[id number] dated 09/25/21. ?

## 2021-10-01 ENCOUNTER — Telehealth: Payer: Self-pay | Admitting: *Deleted

## 2021-10-01 NOTE — Telephone Encounter (Signed)
Called patient to confirm her appointment here at 1:15 tomorrow. She is aware of office location and has transportation to appointment. Will need a w/c for visit--informed her that wheelchairs are in the lobby to use. ?

## 2021-10-02 ENCOUNTER — Inpatient Hospital Stay (HOSPITAL_BASED_OUTPATIENT_CLINIC_OR_DEPARTMENT_OTHER): Payer: Medicare Other | Admitting: Nurse Practitioner

## 2021-10-02 ENCOUNTER — Inpatient Hospital Stay: Payer: Medicare Other | Attending: Oncology

## 2021-10-02 ENCOUNTER — Encounter: Payer: Self-pay | Admitting: Nurse Practitioner

## 2021-10-02 VITALS — BP 110/61 | HR 100 | Temp 98.2°F | Resp 18 | Ht 63.0 in | Wt 181.0 lb

## 2021-10-02 DIAGNOSIS — I130 Hypertensive heart and chronic kidney disease with heart failure and stage 1 through stage 4 chronic kidney disease, or unspecified chronic kidney disease: Secondary | ICD-10-CM | POA: Insufficient documentation

## 2021-10-02 DIAGNOSIS — C50919 Malignant neoplasm of unspecified site of unspecified female breast: Secondary | ICD-10-CM

## 2021-10-02 DIAGNOSIS — R946 Abnormal results of thyroid function studies: Secondary | ICD-10-CM | POA: Diagnosis not present

## 2021-10-02 DIAGNOSIS — Z170 Estrogen receptor positive status [ER+]: Secondary | ICD-10-CM | POA: Diagnosis not present

## 2021-10-02 DIAGNOSIS — I4821 Permanent atrial fibrillation: Secondary | ICD-10-CM | POA: Insufficient documentation

## 2021-10-02 DIAGNOSIS — R7401 Elevation of levels of liver transaminase levels: Secondary | ICD-10-CM | POA: Insufficient documentation

## 2021-10-02 DIAGNOSIS — C7951 Secondary malignant neoplasm of bone: Secondary | ICD-10-CM | POA: Diagnosis not present

## 2021-10-02 DIAGNOSIS — Z79899 Other long term (current) drug therapy: Secondary | ICD-10-CM | POA: Insufficient documentation

## 2021-10-02 DIAGNOSIS — N189 Chronic kidney disease, unspecified: Secondary | ICD-10-CM | POA: Diagnosis not present

## 2021-10-02 DIAGNOSIS — I2510 Atherosclerotic heart disease of native coronary artery without angina pectoris: Secondary | ICD-10-CM | POA: Insufficient documentation

## 2021-10-02 DIAGNOSIS — D259 Leiomyoma of uterus, unspecified: Secondary | ICD-10-CM | POA: Diagnosis not present

## 2021-10-02 DIAGNOSIS — E1122 Type 2 diabetes mellitus with diabetic chronic kidney disease: Secondary | ICD-10-CM | POA: Diagnosis not present

## 2021-10-02 DIAGNOSIS — E785 Hyperlipidemia, unspecified: Secondary | ICD-10-CM | POA: Diagnosis not present

## 2021-10-02 DIAGNOSIS — Z853 Personal history of malignant neoplasm of breast: Secondary | ICD-10-CM | POA: Insufficient documentation

## 2021-10-02 DIAGNOSIS — I5022 Chronic systolic (congestive) heart failure: Secondary | ICD-10-CM | POA: Insufficient documentation

## 2021-10-02 DIAGNOSIS — Z79811 Long term (current) use of aromatase inhibitors: Secondary | ICD-10-CM

## 2021-10-02 DIAGNOSIS — D649 Anemia, unspecified: Secondary | ICD-10-CM | POA: Insufficient documentation

## 2021-10-02 DIAGNOSIS — N179 Acute kidney failure, unspecified: Secondary | ICD-10-CM | POA: Insufficient documentation

## 2021-10-02 DIAGNOSIS — Z9012 Acquired absence of left breast and nipple: Secondary | ICD-10-CM | POA: Diagnosis not present

## 2021-10-02 LAB — CMP (CANCER CENTER ONLY)
ALT: 54 U/L — ABNORMAL HIGH (ref 0–44)
AST: 41 U/L (ref 15–41)
Albumin: 3.6 g/dL (ref 3.5–5.0)
Alkaline Phosphatase: 97 U/L (ref 38–126)
Anion gap: 11 (ref 5–15)
BUN: 26 mg/dL — ABNORMAL HIGH (ref 8–23)
CO2: 31 mmol/L (ref 22–32)
Calcium: 10.3 mg/dL (ref 8.9–10.3)
Chloride: 95 mmol/L — ABNORMAL LOW (ref 98–111)
Creatinine: 2.08 mg/dL — ABNORMAL HIGH (ref 0.44–1.00)
GFR, Estimated: 25 mL/min — ABNORMAL LOW (ref >60)
Glucose, Bld: 152 mg/dL — ABNORMAL HIGH (ref 70–99)
Potassium: 4.7 mmol/L (ref 3.5–5.1)
Sodium: 137 mmol/L (ref 135–145)
Total Bilirubin: 0.6 mg/dL (ref 0.3–1.2)
Total Protein: 7.7 g/dL (ref 6.5–8.1)

## 2021-10-02 LAB — CBC WITH DIFFERENTIAL (CANCER CENTER ONLY)
Abs Immature Granulocytes: 0.05 10*3/uL (ref 0.00–0.07)
Basophils Absolute: 0 10*3/uL (ref 0.0–0.1)
Basophils Relative: 0 %
Eosinophils Absolute: 0.1 10*3/uL (ref 0.0–0.5)
Eosinophils Relative: 1 %
HCT: 30.5 % — ABNORMAL LOW (ref 36.0–46.0)
Hemoglobin: 9 g/dL — ABNORMAL LOW (ref 12.0–15.0)
Immature Granulocytes: 0 %
Lymphocytes Relative: 8 %
Lymphs Abs: 1.2 10*3/uL (ref 0.7–4.0)
MCH: 27.4 pg (ref 26.0–34.0)
MCHC: 29.5 g/dL — ABNORMAL LOW (ref 30.0–36.0)
MCV: 93 fL (ref 80.0–100.0)
Monocytes Absolute: 0.6 10*3/uL (ref 0.1–1.0)
Monocytes Relative: 4 %
Neutro Abs: 12.2 10*3/uL — ABNORMAL HIGH (ref 1.7–7.7)
Neutrophils Relative %: 87 %
Platelet Count: 386 10*3/uL (ref 150–400)
RBC: 3.28 MIL/uL — ABNORMAL LOW (ref 3.87–5.11)
RDW: 17.9 % — ABNORMAL HIGH (ref 11.5–15.5)
WBC Count: 14.2 10*3/uL — ABNORMAL HIGH (ref 4.0–10.5)
nRBC: 0 % (ref 0.0–0.2)

## 2021-10-02 MED ORDER — LETROZOLE 2.5 MG PO TABS: 2.5000 mg | ORAL_TABLET | Freq: Every day | ORAL | 5 refills | Status: AC

## 2021-10-02 NOTE — Progress Notes (Signed)
[place] OFFICE PROGRESS NOTE   Diagnosis: Breast cancer  INTERVAL HISTORY:   Stephanie Franco returns for her first outpatient visit since she was seen in the hospital by Dr. Benay Spice.  She has pain in various areas.  Appetite varies.  No nausea or vomiting.  Bowels moving.  She recently took a laxative.  Objective:  Vital signs in last 24 hours:  Blood pressure 110/61, pulse 100, temperature 98.2 F (36.8 C), temperature source Oral, resp. rate 18, height _0  (1.6 m), weight 181 lb (82.1 kg), SpO2 98 %.    Resp: Lungs clear bilaterally. Cardio: Irregular. GI: Abdomen soft and nontender.  No hepatomegaly. Vascular: No leg edema.   Lab Results:  Lab Results  Component Value Date   WBC 14.2 (H) 10/02/2021   HGB 9.0 (L) 10/02/2021   HCT 30.5 (L) 10/02/2021   MCV 93.0 10/02/2021   PLT 386 10/02/2021   NEUTROABS 12.2 (H) 10/02/2021    Imaging:  No results found.  Medications: I have reviewed the patient's current medications.  Assessment/Plan: 1.  Bone lesions of the cervical spine -MRI brain with and without contrast 09/21/2021-multiple metastatic bone lesions, largest at the lateral sphenoid wing on the left -MRI of the cervical spine with and without contrast 09/21/2021-metastatic disease throughout the cervical region most prominent at C3, C6, C7, and T1.  T3 also involved but not well evaluated. -CT chest/abdomen/pelvis without contrast 09/22/2021-diffuse lytic and sclerotic lesions in the bones compatible with metastatic disease, mild compression deformity in the inferior endplate of B35 which is indeterminate in age, indeterminate hypodensity in the posterior right lobe of the liver measuring 1.2 cm. -Bone scan 09/23/2021-numerous areas of abnormal uptake including the distal femurs 2.  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  Fibrosarcoma of the left breast -Status post left breast mastectomy, positive surgical margins 4.  Normocytic anemia 5.  Acute on chronic kidney disease 6.  Hypercalcemia 7.  Transaminitis 8.  Elevated TSH and free T4 9.  HFrEF 35% diagnosed February 2023 10.  Permanent atrial fibrillation 11.  Hypertension 12.  CAD with prior PCI 13.  Diabetes mellitus 14.  Hyperlipidemia 15.  Uterine fibroids    [name] has been diagnosed with breast cancer metastatic to bone.  Diagnosis reviewed with her and her husband at today's visit.  We discussed the ER positivity as well as pending HER2 by FISH results.  Dr. Benay Spice recommends she begin letrozole.  We reviewed potential side effects including hot flashes, arthralgias, decreased bone strength, hypercholesterolemia.  She was provided with printed information as well.  She agrees to proceed.  We are referring her for a baseline bone density study.  She will hold on beginning calcium due to recent hypercalcemia.  We also recommended beginning Zometa.  We discussed the potential for osteonecrosis of the jaw.  She was provided printed information on Zometa.  She will schedule dental evaluation prior to the next office visit.  We have referred her for genetics counseling.  She will return for lab and follow-up in approximately 4 weeks.  We are  available to see her sooner if needed.  Patient seen with Dr. Benay Spice.        Ned Card ANP/GNP-BC   10/02/2021  2:20 PM  This was a shared visit with Ned Card.  We discussed the pathology findings and treatment options with Stephanie Franco and her husband.   She has been diagnosed with metastatic breast cancer.  HER2 by FISH is pending.  The plan is to begin letrozole.  We will consider adding a CDK inhibitor when she returns next month.  The plan is to begin Zometa after she is seen by her dentist.  I was present for greater than 50% of today's visit.  I performed medical decision making.  Julieanne Manson, MD

## 2021-10-03 ENCOUNTER — Other Ambulatory Visit: Payer: Self-pay | Admitting: Nurse Practitioner

## 2021-10-03 DIAGNOSIS — C50919 Malignant neoplasm of unspecified site of unspecified female breast: Secondary | ICD-10-CM

## 2021-10-07 ENCOUNTER — Encounter (HOSPITAL_COMMUNITY): Payer: Self-pay | Admitting: Cardiology

## 2021-10-07 ENCOUNTER — Ambulatory Visit (HOSPITAL_BASED_OUTPATIENT_CLINIC_OR_DEPARTMENT_OTHER)
Admission: RE | Admit: 2021-10-07 | Discharge: 2021-10-07 | Disposition: A | Discharge status: Home / Self Care | Payer: Medicare Other | Source: Ambulatory Visit | Attending: Cardiology | Admitting: Cardiology

## 2021-10-07 ENCOUNTER — Other Ambulatory Visit (HOSPITAL_COMMUNITY): Payer: Self-pay

## 2021-10-07 ENCOUNTER — Ambulatory Visit: Payer: PRIVATE HEALTH INSURANCE | Admitting: Nurse Practitioner

## 2021-10-07 ENCOUNTER — Ambulatory Visit (HOSPITAL_COMMUNITY)
Admission: RE | Admit: 2021-10-07 | Discharge: 2021-10-07 | Disposition: A | Discharge status: Home / Self Care | Payer: Medicare Other | Source: Ambulatory Visit | Attending: Cardiology | Admitting: Cardiology

## 2021-10-07 ENCOUNTER — Other Ambulatory Visit: Payer: PRIVATE HEALTH INSURANCE

## 2021-10-07 VITALS — BP 126/90 | HR 89 | Wt 186.4 lb

## 2021-10-07 DIAGNOSIS — I5042 Chronic combined systolic (congestive) and diastolic (congestive) heart failure: Secondary | ICD-10-CM

## 2021-10-07 DIAGNOSIS — G4730 Sleep apnea, unspecified: Secondary | ICD-10-CM | POA: Insufficient documentation

## 2021-10-07 DIAGNOSIS — I110 Hypertensive heart disease with heart failure: Secondary | ICD-10-CM | POA: Diagnosis not present

## 2021-10-07 DIAGNOSIS — I272 Pulmonary hypertension, unspecified: Secondary | ICD-10-CM | POA: Diagnosis not present

## 2021-10-07 DIAGNOSIS — I2510 Atherosclerotic heart disease of native coronary artery without angina pectoris: Secondary | ICD-10-CM | POA: Diagnosis not present

## 2021-10-07 DIAGNOSIS — I5032 Chronic diastolic (congestive) heart failure: Secondary | ICD-10-CM | POA: Insufficient documentation

## 2021-10-07 DIAGNOSIS — E119 Type 2 diabetes mellitus without complications: Secondary | ICD-10-CM | POA: Insufficient documentation

## 2021-10-07 DIAGNOSIS — I071 Rheumatic tricuspid insufficiency: Secondary | ICD-10-CM | POA: Insufficient documentation

## 2021-10-07 DIAGNOSIS — Z853 Personal history of malignant neoplasm of breast: Secondary | ICD-10-CM | POA: Insufficient documentation

## 2021-10-07 DIAGNOSIS — E785 Hyperlipidemia, unspecified: Secondary | ICD-10-CM | POA: Diagnosis not present

## 2021-10-07 DIAGNOSIS — R011 Cardiac murmur, unspecified: Secondary | ICD-10-CM | POA: Insufficient documentation

## 2021-10-07 DIAGNOSIS — I340 Nonrheumatic mitral (valve) insufficiency: Secondary | ICD-10-CM | POA: Diagnosis not present

## 2021-10-07 DIAGNOSIS — I4821 Permanent atrial fibrillation: Secondary | ICD-10-CM

## 2021-10-07 LAB — BASIC METABOLIC PANEL
Anion gap: 7 (ref 5–15)
BUN: 17 mg/dL (ref 8–23)
CO2: 28 mmol/L (ref 22–32)
Calcium: 9.9 mg/dL (ref 8.9–10.3)
Chloride: 103 mmol/L (ref 98–111)
Creatinine, Ser: 1.95 mg/dL — ABNORMAL HIGH (ref 0.44–1.00)
GFR, Estimated: 27 mL/min — ABNORMAL LOW (ref >60)
Glucose, Bld: 86 mg/dL (ref 70–99)
Potassium: 5.7 mmol/L — ABNORMAL HIGH (ref 3.5–5.1)
Sodium: 138 mmol/L (ref 135–145)

## 2021-10-07 LAB — ECHOCARDIOGRAM COMPLETE
Area-P 1/2: 5.46 cm2
Calc EF: 28.5 %
MV M vel: 4.49 m/s
MV Peak grad: 80.6 mmHg
Radius: 0.6 cm
S' Lateral: 4.05 cm
Single Plane A2C EF: 31.5 %
Single Plane A4C EF: 25.5 %

## 2021-10-07 LAB — BRAIN NATRIURETIC PEPTIDE: B Natriuretic Peptide: 71.8 pg/mL (ref 0.0–100.0)

## 2021-10-07 MED ORDER — HYDRALAZINE HCL 25 MG PO TABS: 12.5000 mg | ORAL_TABLET | Freq: Three times a day (TID) | ORAL | 4 refills | Status: AC

## 2021-10-07 MED ORDER — TORSEMIDE 20 MG PO TABS: ORAL_TABLET | ORAL | 3 refills | Status: DC

## 2021-10-07 NOTE — Patient Instructions (Signed)
Medication Changes:  Stop Losartan  Start Hydralazine 12.'5mg'$  (1/2 Tab) Three times a day  Increase Torsemide to '40mg'$  in the morning then 20 mg in the evening for 4 days, then alternate '40mg'$  in the morning and '20mg'$  in the evening with 20 mg in the morning and '20mg'$  in the evening.  Lab Work:  Labs done today, your results will be available in MyChart, we will contact you for abnormal readings.   Testing/Procedures:  You are scheduled for a Cardiac Catheterization on Wednesday, June 7 with Dr. Loralie Champagne.  1. Please arrive at the Main Entrance A at [facility]: [address], [place] [address] at 10:00 AM (This time is two hours before your procedure to ensure your preparation). Free valet parking service is available.   Special note: Every effort is made to have your procedure done on time. Please understand that emergencies sometimes delay scheduled procedures.  2. Diet: Do not eat solid foods after midnight.  You may have clear liquids until 5 AM upon the day of the procedure.  3. Labs: will be drawn morning of procedure.  4. Medication instructions in preparation for your procedure:   Contrast Allergy: No  Stop taking Eliquis (Apixiban) on Tuesday, June 6.  Stop taking, Torsemide (Demadex) Wednesday, June 7,  Stop taking Toujeo morning of procedure    On the morning of your procedure, take  any morning medicines NOT listed above.  You may use sips of water.  5. Plan to go home the same day, you will only stay overnight if medically necessary. 6. You MUST have a responsible adult to drive you home. 7. An adult MUST be with you the first 24 hours after you arrive home. 8. Bring a current list of your medications, and the last time and date medication taken. 9. Bring ID and current insurance cards. 10.Please wear clothes that are easy to get on and off and wear slip-on shoes.   You are scheduled for a TEE/Cardioversion/TEE Cardioversion on Wednesday June  7th  with Dr. Aundra Dubin.   DIET: Nothing to eat or drink after midnight except a sip of water with medications (see medication instructions below)  FYI: For your safety, and to allow [place] to monitor your vital signs accurately during the surgery/procedure we request that   if you have artificial nails, gel coating, SNS etc. Please have those removed prior to your surgery/procedure. Not having the nail coverings /polish removed may result in cancellation or delay of your surgery/procedure.   You must have a responsible person to drive you home and stay in the waiting area during your procedure. Failure to do so could result in cancellation.  Bring your insurance cards.  *Special Note: Every effort is made to have your procedure done on time. Occasionally there are emergencies that occur at the hospital that may cause delays. Please be patient if a delay does occur.     Referrals:  None  Special Instructions // Education:  none  Follow-Up in: as scheduled   At the [facility], you and your health needs are our priority. We have a designated team specialized in the treatment of Heart Failure. This Care Team includes your primary Heart Failure Specialized Cardiologist (physician), Advanced Practice Providers (APPs- Physician Assistants and Nurse Practitioners), and Pharmacist who all work together to provide you with the care you need, when you need it.   You may see any of the following providers on your designated Care Team at  your next follow up:  Dr Glori Bickers Dr Haynes Kerns, NP Lyda Jester, [place] [facility] [place], [place] Audry Riles, PharmD   Please be sure to bring in all your medications bottles to every appointment.   Need to Contact us:  If you have any questions or concerns before your next appointment please send [place] a message through Adrian or call our office at [phone].    TO LEAVE A MESSAGE FOR THE NURSE SELECT  OPTION 2, PLEASE LEAVE A MESSAGE INCLUDING: YOUR NAME DATE OF BIRTH CALL BACK NUMBER REASON FOR CALL**this is important as we prioritize the call backs  YOU WILL RECEIVE A CALL BACK THE SAME DAY AS LONG AS YOU CALL BEFORE 4:00 PM

## 2021-10-08 NOTE — H&P (View-Only) (Signed)
ADVANCED HF CLINIC NOTE   Primary Care: Lin Landsman, MD Primary Cardiologist: Dr. Ellyn Hack HF Cardiologist: Dr. Aundra Dubin  HPI: Ms Chenard is a 71 y.o. with history of permanent atrial fibrillation for 20 years, DMII, HTN, Hyperlipidemia, left breast cancer (had mastectomy 20 years ago), right hip replacement, arthritis,  CAD with prior PCI OM in 5883 and systolic heart failure.    Had sleep study 05/2021 - negative for sleep apnea.   She was admitted 06/20/21 with chest pain, but also noted to have orthopnea and worsening dyspnea. HS Trop 71>58>51. Echo showed EF 25-30%, moderate RV dysfunction, moderate biatrial enlargement, at least moderate posteriorly-directed MR. LHC/RHC showed stable CAD with elevated R>L heart filling pressures and low cardiac index 1.89.  AHF team consulted. She was started on milrinone, PICC placed. Started on IV lasix and added amiodarone given frequent PVCs. Underwent further w/u of pulmonary hypertension and HF. V/Q scan was not suggestive of chronic PE.  Serologic workup negative (ANA, anti-centromere ab, anti-scl70, RF). TEE was done to better assess severity of her MR. This showed restriction of the posterior mitral leaflet and moderate eccentric mitral regurgitation (posteriorly-directed), normal LV size w/ global HK EF 35-40%, RV size normal w/ moderate systolic dysfunction, severe LAE. Suspect functional MR, possibly atrial functional MR. 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  LVEF on MRI 36%, MR moderate. She was weaned off of milrinone w/ stable co-ox and transition to PO diuretics, torsemide 40 mg daily. GDMT added. SCr stabilized ~1.9. She was discharged home, weight 188 lbs.  Post hospital follow up 2/23, mildly volume up and instructed to take extra 20 mg of torsemide x 2 day and Imdur increased. SCr stable at 1.58  Seen in ED 3/23 with CP, SOB and elevated SCr. Cards  saw, felt she was on the dry side. Given gentle IVF, digoxin and amio stopped due to bradycardia/junctional rhythm, and hydralazine stopped due to fatigue.   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  Biopsy was done showing breast cancer primary. She has been started on letrozole.   Echo was done today and reviewed, showing EF 30-35%, mildly dilated RV with mild RV systolic dysfunction, PASP 67 mmHg, moderate-severe functional MR with posterior leaflet restriction and dilated IVC.  She has been off losartan, and torsemide has been decreased to 20 mg bid due to elevated creatinine (most recently 2.08).   Today she returns for HF follow up with her husband. No dyspnea walking on flat ground.  She gets short of breath walking up a hill.  She has orthopnea and raises the head of her bed.  No chest pain.  No lightheadedness.  Weight is up at home.  She feels bloated.   ECG (personally reviewed):  Atrial fibrillation, LAFB, poor RWP  Labs (2/23): K 4.0, creatinine 1.97 Labs (3/23): K 3.9, creatinine 1.45 Labs (5/23): K 4.7, creatinine 2.08  PMH: 1. Type 2 diabetes 2. Breast cancer: Mastectomy 2008.  Bony metastases found 5/23, biopsy showed breast primary.  3. CKD stage 3 4. Atrial fibrillation: Permanent.  5. CAD: DES to LCx in 2006.  - LHC (2/23): 60% proximal RCA, 90% distal LCx.  Medical management.  6. Gout 7. Hyperlipidemia 8. HTN 9. Chronic systolic CHF: Primarily nonischemic cardiomyopathy.  - Echo (2019): EF 40-45% RV moderately dilated.  - Echo (2020): EF 50-55%  LA and RA moderately dilated. - Echo (2/23): EF 25%,  RV  moderately reduced systolic function, LA severely dilated, RA moderately dilated, MV restricted posterior leaflet  with moderate MR.  - cMRI (2/23): Findings suggest mixed ischemic/nonischemic cardiomyopathy (?myocarditis with prior coronary disease).  LVEF on MRI 36%. MR moderate.  - RHC (2/23): RA 21, PA 80/30, LVEDP 14, CI 1.9, PVR  11 WU. - Echo (5/23): EF 30-35%, mildly dilated RV with mild RV systolic dysfunction, PASP 67 mmHg, moderate-severe functional MR with posterior leaflet restriction and dilated IVC.  10. Mitral regurgitation: Suspect functional MR, possibly atrial functional MR.  - Moderate-severe MR with restricted posterior leaflet on 5/23 echo.  11. Pulmonary hypertension:  - RHC (2/23): RA 21, PA 80/30, LVEDP 14, CI 1.9, PVR 11 WU. - VQ scan (2/23): no evidence for chronic PE - Sleep study negative - Serologic workup negative (ANA, anti-centromere ab, anti-scl70, RF) - CT chest (5/23) did not show ILD.  12. PVCs   Current Outpatient Medications  Medication Sig Dispense Refill   acetaminophen (TYLENOL) 500 MG tablet Take 1,000 mg by mouth every 6 (six) hours as needed for mild pain.     albuterol (PROVENTIL HFA;VENTOLIN HFA) 108 (90 Base) MCG/ACT inhaler Inhale 1-2 puffs into the lungs every 6 (six) hours as needed for wheezing or shortness of breath. 1 Inhaler 0   albuterol (PROVENTIL) (2.5 MG/3ML) 0.083% nebulizer solution Take 3 mLs (2.5 mg total) by nebulization every 6 (six) hours as needed for wheezing or shortness of breath. 75 mL 12   allopurinol (ZYLOPRIM) 300 MG tablet Take 300 mg by mouth daily.     dapagliflozin propanediol (FARXIGA) 10 MG TABS tablet Take 1 tablet (10 mg total) by mouth daily. 30 tablet 5   ELIQUIS 5 MG TABS tablet TAKE 1 TABLET BY MOUTH TWICE A DAY (Patient taking differently: Take 5 mg by mouth 2 (two) times daily.) 180 tablet 2   ezetimibe (ZETIA) 10 MG tablet Take 1 tablet (10 mg total) by mouth daily. 30 tablet 5   fexofenadine (ALLEGRA) 180 MG tablet Take 180 mg by mouth daily as needed for allergies.   3   fluticasone (FLONASE) 50 MCG/ACT nasal spray Place 2 sprays into the nose daily as needed for allergies or rhinitis.      gabapentin (NEURONTIN) 600 MG tablet Take 300 mg by mouth daily.     glucose blood test strip 1 each by Other route as needed for other. Use as  instructed     hydrALAZINE (APRESOLINE) 25 MG tablet Take 0.5 tablets (12.5 mg total) by mouth 3 (three) times daily. 80 tablet 4   Hydrocortisone, Perianal, 1 % CREA Apply topically 3 (three) times daily.     isosorbide mononitrate (IMDUR) 60 MG 24 hr tablet Take 1 tablet (60 mg total) by mouth daily. 30 tablet 6   letrozole (FEMARA) 2.5 MG tablet Take 1 tablet (2.5 mg total) by mouth daily. 30 tablet 5   LORazepam (ATIVAN) 1 MG tablet Take 1 mg by mouth 2 (two) times daily.     Naphazoline-Glycerin (CLEAR EYES MAX REDNESS RELIEF OP) Place 2 drops into both eyes daily as needed (redness).     nitroGLYCERIN (NITROSTAT) 0.4 MG SL tablet DISSOLVE 1 TABLET UNDER THE TONGUE EVERY 5 MINUTES AS NEEDED FOR CHEST PAIN. (Patient taking differently: Place 0.4 mg under the tongue every 5 (five) minutes as needed for chest pain.) 25 tablet 6   ondansetron (ZOFRAN) 4 MG tablet Take 1 tablet (4 mg total) by mouth every 6 (six) hours as needed for nausea.  20 tablet 0   OVER THE COUNTER MEDICATION Apply 1 application. topically daily as needed (pain). Apply to legs and feet     Oxycodone HCl 10 MG TABS Take 5 mg by mouth 3 (three) times daily as needed for pain.     pantoprazole (PROTONIX) 40 MG tablet TAKE 1 TABLET BY MOUTH EVERY DAY (Patient taking differently: Take 40 mg by mouth daily.) 90 tablet 3   polyethylene glycol powder (GLYCOLAX/MIRALAX) 17 GM/SCOOP powder Take 17 g by mouth daily. 238 g 0   potassium chloride SA (KLOR-CON M) 20 MEQ tablet Take 2 tablets (40 mEq total) by mouth 2 (two) times daily. 120 tablet 0   rosuvastatin (CRESTOR) 40 MG tablet Take 1 tablet (40 mg total) by mouth daily. 90 tablet 3   senna-docusate (SENOKOT-S) 8.6-50 MG tablet Take 1 tablet by mouth 2 (two) times daily. 60 tablet 0   spironolactone (ALDACTONE) 25 MG tablet Take 1 tablet (25 mg total) by mouth daily. 30 tablet 0   TOUJEO SOLOSTAR 300 UNIT/ML Solostar Pen Inject 30 Units into the skin 2 (two) times daily.      triamcinolone ointment (KENALOG) 0.5 % Apply to legs 2 times daily for 2 weeks (Patient taking differently: Apply 1 application. topically 2 (two) times daily as needed (Irritation).) 30 g 0   torsemide (DEMADEX) 20 MG tablet '40mg'$  in the morning and 20 mg in the evening alternating with 20 mg in the morning and 20 mg in the evening 300 tablet 3   No current facility-administered medications for this encounter.   No Known Allergies  Social History   Socioeconomic History   Marital status: Married    Spouse name: Not on file   Number of children: 4   Years of education: Not on file   Highest education level: Not on file  Occupational History   Not on file  Tobacco Use   Smoking status: Former    Packs/day: 0.50    Years: 6.00    Pack years: 3.00    Types: Cigarettes    Quit date: 05/18/1992    Years since quitting: 29.4   Smokeless tobacco: Never   Tobacco comments:    Former smoker 08/15/21  Vaping Use   Vaping Use: Never used  Substance and Sexual Activity   Alcohol use: No    Comment: 07/13/11 "have drank occasionally; not now"   Drug use: No   Sexual activity: Not Currently    Birth control/protection: Surgical  Other Topics Concern   Not on file  Social History Narrative   She is a married mother of 71, grandmother 2. Usually accompanied by her husband. She does not work. She had been working on her exercise, but is no longer as active. Does not drink and does not smoke   Social Determinants of Radio broadcast assistant Strain: Not on file  Food Insecurity: Not on file  Transportation Needs: Not on file  Physical Activity: Not on file  Stress: Not on file  Social Connections: Not on file  Intimate Partner Violence: Not on file   Family History  Problem Relation Age of Onset   Coronary artery disease Mother    Hypertension Mother    Heart disease Father    Atrial fibrillation Son    Lung cancer Sister    Heart disease Brother    Heart disease Brother     Healthy Son    Thyroid disease Daughter    Healthy Daughter  Healthy Daughter    FH: her son died 03-Aug-2017 massive heart attack   BP 126/90   Pulse 89   Wt 84.6 kg (186 lb 6.4 oz)   SpO2 98%   BMI 33.02 kg/m   Wt Readings from Last 3 Encounters:  10/07/21 84.6 kg (186 lb 6.4 oz)  10/02/21 82.1 kg (181 lb)  09/26/21 85 kg (187 lb 6.3 oz)   PHYSICAL EXAM: General: NAD Neck: JVP 14 cm, no thyromegaly or thyroid nodule.  Lungs: Clear to auscultation bilaterally with normal respiratory effort. CV: Nondisplaced PMI.  Heart irregular S1/S2, no S3/S4, 2/6 HSM apex.  Trace edema.  No carotid bruit.  Normal pedal pulses.  Abdomen: Soft, nontender, no hepatosplenomegaly, no distention.  Skin: Intact without lesions or rashes.  Neurologic: Alert and oriented x 3.  Psych: Normal affect. Extremities: No clubbing or cyanosis.  HEENT: Normal.   ASSESSMENT & PLAN: 1. Chronic systolic CHF: Mixed ischemic/nonischemic cardiomyopathy. Echo 1/23 with EF 25-30%, moderate RV dysfunction, moderate biatrial enlargement, at least moderate posteriorly-directed MR (may be worse).  Most recent prior echo showed EF 50-55% in 9/20, EF was 20-25% on 6/16 echo.  It is not clear why EF has dropped since 9/20 (cath does not explain).  Atrial fibrillation has been chronic x years. Frequent PVCs are noted.  RHC showed elevated R>L heart filling pressures with pulmonary arterial hypertension and low CI 1.89. Cardiac MRI w/ myocardial LGE in septum is in a noncoronary pattern, ? myocarditis, anterior wall LGE is subendocardial and may be due to prior MI => ?mixed ischemic/nonischemic cardiomyopathy. Repeat echo today showed EF 30-35%, mildly dilated RV with mild RV systolic dysfunction, PASP 67 mmHg, moderate-severe functional MR with posterior leaflet restriction and dilated IVC.  NYHA III, volume overloaded on exam.  This is complicated by cardiorenal syndrome.  - She is going to need to increase torsemide.  Increase to 40  qam/20 qpm x 4 days then alternate 40 qam/20 qpm with 20 mg bid. BMET/BNP today, BMET 10 days.  - Continue spironolactone 25 mg daily.  - Continue Farxiga 10 mg daily.  - She will stay off losartan with elevated creatinine.   - Continue Imdur 60 mg daily and add hydralazine 12.5 mg tid. - Off Toprol with bradycardia. - Off digoxin with bradycardia. - I am going to arrange for RHC at time of TEE (see below).  Discussed risks/benefits with patient and she agreed to procedure.  2. Atrial fibrillation: This appears permanent, x years.   - Continue Eliquis 5 mg bid. No bleeding issues.  3. PVCs: Frequent during 2/23 admission but was also on milrinone. ? Contributing to cardiomyopathy.   - Now off amiodarone. 4. Mitral regurgitation: TEE 2/23 showed restriction of the posterior mitral leaflet and moderate eccentric mitral regurgitation (posteriorly-directed). Normal LV size w/ global HK EF 35-40%, RV size normal w/ moderate systolic dysfunction, severe LAE. Suspect functional MR, possibly atrial functional MR.  Cardiac MRI with moderate MR. Echo today showed EF 30-35%, mildly dilated RV with mild RV systolic dysfunction, PASP 67 mmHg, moderate-severe functional MR with posterior leaflet restriction and dilated IVC. - Possible progression of MR, with increased dyspnea and persistent volume overload, will arrange for repeat TEE to reassess the mitral valve.  If severe MR, may be candidate for Mitraclip.  Discussed risks/benefits with patient and she agrees to procedure.   5. CKD stage 3: Creatinine has trended higher, up to 2.08 recently.  However, she is volume overloaded and needs additional diuresis.  Increasing torsemide carefully as above.  6. Pulmonary hypertension: On 2/23 RHC, PCWP not obtained, so with using LVEDP, PVR is 11 WU suggesting severe pulmonary arterial hypertension.  Etiology unclear.  Sleep study negative.  V/Q scan not suggestive of chronic PE.  CT chest did not show interstitial lung  disease. Serologic workup negative (ANA, anti-centromere ab, anti-scl70, RF) - Repeat RHC as above with plan to get a true PCWP.  7. CAD: Cath 2/23 with prox RCA to Mid RCA lesion 60% stenosis, distal LCx lesion 90% stenosis (similar to the past).  Medical management. Not clear that CAD can explain her cardiomyopathy. No further CP. - Continue statin.  - No ASA given Eliquis use.    Followup 3 wks with APP.   Loralie Champagne 10/08/2021

## 2021-10-08 NOTE — Progress Notes (Signed)
ADVANCED HF CLINIC NOTE   Primary Care: Lin Landsman, MD Primary Cardiologist: Dr. Ellyn Hack HF Cardiologist: Dr. Aundra Dubin  HPI: Stephanie Franco is a 71 y.o. with history of permanent atrial fibrillation for 20 years, DMII, HTN, Hyperlipidemia, left breast cancer (had mastectomy 20 years ago), right hip replacement, arthritis,  CAD with prior PCI OM in 0254 and systolic heart failure.    Had sleep study 05/2021 - negative for sleep apnea.   She was admitted 06/20/21 with chest pain, but also noted to have orthopnea and worsening dyspnea. HS Trop 71>58>51. Echo showed EF 25-30%, moderate RV dysfunction, moderate biatrial enlargement, at least moderate posteriorly-directed MR. LHC/RHC showed stable CAD with elevated R>L heart filling pressures and low cardiac index 1.89.  AHF team consulted. She was started on milrinone, PICC placed. Started on IV lasix and added amiodarone given frequent PVCs. Underwent further w/u of pulmonary hypertension and HF. V/Q scan was not suggestive of chronic PE.  Serologic workup negative (ANA, anti-centromere ab, anti-scl70, RF). TEE was done to better assess severity of her MR. This showed restriction of the posterior mitral leaflet and moderate eccentric mitral regurgitation (posteriorly-directed), normal LV size w/ global HK EF 35-40%, RV size normal w/ moderate systolic dysfunction, severe LAE. Suspect functional MR, possibly atrial functional MR. 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  LVEF on MRI 36%, MR moderate. She was weaned off of milrinone w/ stable co-ox and transition to PO diuretics, torsemide 40 mg daily. GDMT added. SCr stabilized ~1.9. She was discharged home, weight 188 lbs.  Post hospital follow up 2/23, mildly volume up and instructed to take extra 20 mg of torsemide x 2 day and Imdur increased. SCr stable at 1.58  Seen in ED 3/23 with CP, SOB and elevated SCr. Cards  saw, felt she was on the dry side. Given gentle IVF, digoxin and amio stopped due to bradycardia/junctional rhythm, and hydralazine stopped due to fatigue.   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  Biopsy was done showing breast cancer primary. She has been started on letrozole.   Echo was done today and reviewed, showing EF 30-35%, mildly dilated RV with mild RV systolic dysfunction, PASP 67 mmHg, moderate-severe functional MR with posterior leaflet restriction and dilated IVC.  She has been off losartan, and torsemide has been decreased to 20 mg bid due to elevated creatinine (most recently 2.08).   Today she returns for HF follow up with her husband. No dyspnea walking on flat ground.  She gets short of breath walking up a hill.  She has orthopnea and raises the head of her bed.  No chest pain.  No lightheadedness.  Weight is up at home.  She feels bloated.   ECG (personally reviewed):  Atrial fibrillation, LAFB, poor RWP  Labs (2/23): K 4.0, creatinine 1.97 Labs (3/23): K 3.9, creatinine 1.45 Labs (5/23): K 4.7, creatinine 2.08  PMH: 1. Type 2 diabetes 2. Breast cancer: Mastectomy 2008.  Bony metastases found 5/23, biopsy showed breast primary.  3. CKD stage 3 4. Atrial fibrillation: Permanent.  5. CAD: DES to LCx in 2006.  - LHC (2/23): 60% proximal RCA, 90% distal LCx.  Medical management.  6. Gout 7. Hyperlipidemia 8. HTN 9. Chronic systolic CHF: Primarily nonischemic cardiomyopathy.  - Echo (2019): EF 40-45% RV moderately dilated.  - Echo (2020): EF 50-55%  LA and RA moderately dilated. - Echo (2/23): EF 25%,  RV  moderately reduced systolic function, LA severely dilated, RA moderately dilated, MV restricted posterior leaflet  with moderate MR.  - cMRI (2/23): Findings suggest mixed ischemic/nonischemic cardiomyopathy (?myocarditis with prior coronary disease).  LVEF on MRI 36%. MR moderate.  - RHC (2/23): RA 21, PA 80/30, LVEDP 14, CI 1.9, PVR  11 WU. - Echo (5/23): EF 30-35%, mildly dilated RV with mild RV systolic dysfunction, PASP 67 mmHg, moderate-severe functional MR with posterior leaflet restriction and dilated IVC.  10. Mitral regurgitation: Suspect functional MR, possibly atrial functional MR.  - Moderate-severe MR with restricted posterior leaflet on 5/23 echo.  11. Pulmonary hypertension:  - RHC (2/23): RA 21, PA 80/30, LVEDP 14, CI 1.9, PVR 11 WU. - VQ scan (2/23): no evidence for chronic PE - Sleep study negative - Serologic workup negative (ANA, anti-centromere ab, anti-scl70, RF) - CT chest (5/23) did not show ILD.  12. PVCs   Current Outpatient Medications  Medication Sig Dispense Refill   acetaminophen (TYLENOL) 500 MG tablet Take 1,000 mg by mouth every 6 (six) hours as needed for mild pain.     albuterol (PROVENTIL HFA;VENTOLIN HFA) 108 (90 Base) MCG/ACT inhaler Inhale 1-2 puffs into the lungs every 6 (six) hours as needed for wheezing or shortness of breath. 1 Inhaler 0   albuterol (PROVENTIL) (2.5 MG/3ML) 0.083% nebulizer solution Take 3 mLs (2.5 mg total) by nebulization every 6 (six) hours as needed for wheezing or shortness of breath. 75 mL 12   allopurinol (ZYLOPRIM) 300 MG tablet Take 300 mg by mouth daily.     dapagliflozin propanediol (FARXIGA) 10 MG TABS tablet Take 1 tablet (10 mg total) by mouth daily. 30 tablet 5   ELIQUIS 5 MG TABS tablet TAKE 1 TABLET BY MOUTH TWICE A DAY (Patient taking differently: Take 5 mg by mouth 2 (two) times daily.) 180 tablet 2   ezetimibe (ZETIA) 10 MG tablet Take 1 tablet (10 mg total) by mouth daily. 30 tablet 5   fexofenadine (ALLEGRA) 180 MG tablet Take 180 mg by mouth daily as needed for allergies.   3   fluticasone (FLONASE) 50 MCG/ACT nasal spray Place 2 sprays into the nose daily as needed for allergies or rhinitis.      gabapentin (NEURONTIN) 600 MG tablet Take 300 mg by mouth daily.     glucose blood test strip 1 each by Other route as needed for other. Use as  instructed     hydrALAZINE (APRESOLINE) 25 MG tablet Take 0.5 tablets (12.5 mg total) by mouth 3 (three) times daily. 80 tablet 4   Hydrocortisone, Perianal, 1 % CREA Apply topically 3 (three) times daily.     isosorbide mononitrate (IMDUR) 60 MG 24 hr tablet Take 1 tablet (60 mg total) by mouth daily. 30 tablet 6   letrozole (FEMARA) 2.5 MG tablet Take 1 tablet (2.5 mg total) by mouth daily. 30 tablet 5   LORazepam (ATIVAN) 1 MG tablet Take 1 mg by mouth 2 (two) times daily.     Naphazoline-Glycerin (CLEAR EYES MAX REDNESS RELIEF OP) Place 2 drops into both eyes daily as needed (redness).     nitroGLYCERIN (NITROSTAT) 0.4 MG SL tablet DISSOLVE 1 TABLET UNDER THE TONGUE EVERY 5 MINUTES AS NEEDED FOR CHEST PAIN. (Patient taking differently: Place 0.4 mg under the tongue every 5 (five) minutes as needed for chest pain.) 25 tablet 6   ondansetron (ZOFRAN) 4 MG tablet Take 1 tablet (4 mg total) by mouth every 6 (six) hours as needed for nausea.  20 tablet 0   OVER THE COUNTER MEDICATION Apply 1 application. topically daily as needed (pain). Apply to legs and feet     Oxycodone HCl 10 MG TABS Take 5 mg by mouth 3 (three) times daily as needed for pain.     pantoprazole (PROTONIX) 40 MG tablet TAKE 1 TABLET BY MOUTH EVERY DAY (Patient taking differently: Take 40 mg by mouth daily.) 90 tablet 3   polyethylene glycol powder (GLYCOLAX/MIRALAX) 17 GM/SCOOP powder Take 17 g by mouth daily. 238 g 0   potassium chloride SA (KLOR-CON M) 20 MEQ tablet Take 2 tablets (40 mEq total) by mouth 2 (two) times daily. 120 tablet 0   rosuvastatin (CRESTOR) 40 MG tablet Take 1 tablet (40 mg total) by mouth daily. 90 tablet 3   senna-docusate (SENOKOT-S) 8.6-50 MG tablet Take 1 tablet by mouth 2 (two) times daily. 60 tablet 0   spironolactone (ALDACTONE) 25 MG tablet Take 1 tablet (25 mg total) by mouth daily. 30 tablet 0   TOUJEO SOLOSTAR 300 UNIT/ML Solostar Pen Inject 30 Units into the skin 2 (two) times daily.      triamcinolone ointment (KENALOG) 0.5 % Apply to legs 2 times daily for 2 weeks (Patient taking differently: Apply 1 application. topically 2 (two) times daily as needed (Irritation).) 30 g 0   torsemide (DEMADEX) 20 MG tablet '40mg'$  in the morning and 20 mg in the evening alternating with 20 mg in the morning and 20 mg in the evening 300 tablet 3   No current facility-administered medications for this encounter.   No Known Allergies  Social History   Socioeconomic History   Marital status: Married    Spouse name: Not on file   Number of children: 4   Years of education: Not on file   Highest education level: Not on file  Occupational History   Not on file  Tobacco Use   Smoking status: Former    Packs/day: 0.50    Years: 6.00    Pack years: 3.00    Types: Cigarettes    Quit date: 05/18/1992    Years since quitting: 29.4   Smokeless tobacco: Never   Tobacco comments:    Former smoker 08/15/21  Vaping Use   Vaping Use: Never used  Substance and Sexual Activity   Alcohol use: No    Comment: 07/13/11 "have drank occasionally; not now"   Drug use: No   Sexual activity: Not Currently    Birth control/protection: Surgical  Other Topics Concern   Not on file  Social History Narrative   She is a married mother of 47, grandmother 2. Usually accompanied by her husband. She does not work. She had been working on her exercise, but is no longer as active. Does not drink and does not smoke   Social Determinants of Radio broadcast assistant Strain: Not on file  Food Insecurity: Not on file  Transportation Needs: Not on file  Physical Activity: Not on file  Stress: Not on file  Social Connections: Not on file  Intimate Partner Violence: Not on file   Family History  Problem Relation Age of Onset   Coronary artery disease Mother    Hypertension Mother    Heart disease Father    Atrial fibrillation Son    Lung cancer Sister    Heart disease Brother    Heart disease Brother     Healthy Son    Thyroid disease Daughter    Healthy Daughter  Healthy Daughter    FH: her son died 08-12-2017 massive heart attack   BP 126/90   Pulse 89   Wt 84.6 kg (186 lb 6.4 oz)   SpO2 98%   BMI 33.02 kg/m   Wt Readings from Last 3 Encounters:  10/07/21 84.6 kg (186 lb 6.4 oz)  10/02/21 82.1 kg (181 lb)  09/26/21 85 kg (187 lb 6.3 oz)   PHYSICAL EXAM: General: NAD Neck: JVP 14 cm, no thyromegaly or thyroid nodule.  Lungs: Clear to auscultation bilaterally with normal respiratory effort. CV: Nondisplaced PMI.  Heart irregular S1/S2, no S3/S4, 2/6 HSM apex.  Trace edema.  No carotid bruit.  Normal pedal pulses.  Abdomen: Soft, nontender, no hepatosplenomegaly, no distention.  Skin: Intact without lesions or rashes.  Neurologic: Alert and oriented x 3.  Psych: Normal affect. Extremities: No clubbing or cyanosis.  HEENT: Normal.   ASSESSMENT & PLAN: 1. Chronic systolic CHF: Mixed ischemic/nonischemic cardiomyopathy. Echo 1/23 with EF 25-30%, moderate RV dysfunction, moderate biatrial enlargement, at least moderate posteriorly-directed MR (may be worse).  Most recent prior echo showed EF 50-55% in 9/20, EF was 20-25% on 6/16 echo.  It is not clear why EF has dropped since 9/20 (cath does not explain).  Atrial fibrillation has been chronic x years. Frequent PVCs are noted.  RHC showed elevated R>L heart filling pressures with pulmonary arterial hypertension and low CI 1.89. Cardiac MRI w/ myocardial LGE in septum is in a noncoronary pattern, ? myocarditis, anterior wall LGE is subendocardial and may be due to prior MI => ?mixed ischemic/nonischemic cardiomyopathy. Repeat echo today showed EF 30-35%, mildly dilated RV with mild RV systolic dysfunction, PASP 67 mmHg, moderate-severe functional MR with posterior leaflet restriction and dilated IVC.  NYHA III, volume overloaded on exam.  This is complicated by cardiorenal syndrome.  - She is going to need to increase torsemide.  Increase to 40  qam/20 qpm x 4 days then alternate 40 qam/20 qpm with 20 mg bid. BMET/BNP today, BMET 10 days.  - Continue spironolactone 25 mg daily.  - Continue Farxiga 10 mg daily.  - She will stay off losartan with elevated creatinine.   - Continue Imdur 60 mg daily and add hydralazine 12.5 mg tid. - Off Toprol with bradycardia. - Off digoxin with bradycardia. - I am going to arrange for RHC at time of TEE (see below).  Discussed risks/benefits with patient and she agreed to procedure.  2. Atrial fibrillation: This appears permanent, x years.   - Continue Eliquis 5 mg bid. No bleeding issues.  3. PVCs: Frequent during 2/23 admission but was also on milrinone. ? Contributing to cardiomyopathy.   - Now off amiodarone. 4. Mitral regurgitation: TEE 2/23 showed restriction of the posterior mitral leaflet and moderate eccentric mitral regurgitation (posteriorly-directed). Normal LV size w/ global HK EF 35-40%, RV size normal w/ moderate systolic dysfunction, severe LAE. Suspect functional MR, possibly atrial functional MR.  Cardiac MRI with moderate MR. Echo today showed EF 30-35%, mildly dilated RV with mild RV systolic dysfunction, PASP 67 mmHg, moderate-severe functional MR with posterior leaflet restriction and dilated IVC. - Possible progression of MR, with increased dyspnea and persistent volume overload, will arrange for repeat TEE to reassess the mitral valve.  If severe MR, may be candidate for Mitraclip.  Discussed risks/benefits with patient and she agrees to procedure.   5. CKD stage 3: Creatinine has trended higher, up to 2.08 recently.  However, she is volume overloaded and needs additional diuresis.  Increasing torsemide carefully as above.  6. Pulmonary hypertension: On 2/23 RHC, PCWP not obtained, so with using LVEDP, PVR is 11 WU suggesting severe pulmonary arterial hypertension.  Etiology unclear.  Sleep study negative.  V/Q scan not suggestive of chronic PE.  CT chest did not show interstitial lung  disease. Serologic workup negative (ANA, anti-centromere ab, anti-scl70, RF) - Repeat RHC as above with plan to get a true PCWP.  7. CAD: Cath 2/23 with prox RCA to Mid RCA lesion 60% stenosis, distal LCx lesion 90% stenosis (similar to the past).  Medical management. Not clear that CAD can explain her cardiomyopathy. No further CP. - Continue statin.  - No ASA given Eliquis use.    Followup 3 wks with APP.   Loralie Champagne 10/08/2021

## 2021-10-09 ENCOUNTER — Telehealth (HOSPITAL_COMMUNITY): Payer: Self-pay

## 2021-10-09 MED ORDER — POTASSIUM CHLORIDE CRYS ER 20 MEQ PO TBCR: 20.0000 meq | EXTENDED_RELEASE_TABLET | Freq: Every day | ORAL | 0 refills | Status: DC

## 2021-10-09 NOTE — Telephone Encounter (Signed)
Spoke to patient's husband about high potassium result. Aware to hold potassium today and decrease potassium to 20 meq daily Scheduled lab appointment as ordered

## 2021-10-15 ENCOUNTER — Ambulatory Visit (HOSPITAL_COMMUNITY)
Admission: RE | Admit: 2021-10-15 | Discharge: 2021-10-15 | Disposition: A | Discharge status: Home / Self Care | Payer: Medicare Other | Source: Ambulatory Visit | Attending: Internal Medicine | Admitting: Internal Medicine

## 2021-10-15 ENCOUNTER — Encounter (HOSPITAL_COMMUNITY): Payer: Self-pay | Admitting: Cardiology

## 2021-10-15 DIAGNOSIS — I5042 Chronic combined systolic (congestive) and diastolic (congestive) heart failure: Secondary | ICD-10-CM | POA: Diagnosis present

## 2021-10-15 LAB — BASIC METABOLIC PANEL WITH GFR
Anion gap: 8 (ref 5–15)
BUN: 25 mg/dL — ABNORMAL HIGH (ref 8–23)
CO2: 30 mmol/L (ref 22–32)
Calcium: 9.5 mg/dL (ref 8.9–10.3)
Chloride: 101 mmol/L (ref 98–111)
Creatinine, Ser: 1.73 mg/dL — ABNORMAL HIGH (ref 0.44–1.00)
GFR, Estimated: 31 mL/min — ABNORMAL LOW
Glucose, Bld: 131 mg/dL — ABNORMAL HIGH (ref 70–99)
Potassium: 5.1 mmol/L (ref 3.5–5.1)
Sodium: 139 mmol/L (ref 135–145)

## 2021-10-21 ENCOUNTER — Encounter: Payer: Self-pay | Admitting: *Deleted

## 2021-10-21 ENCOUNTER — Telehealth (HOSPITAL_COMMUNITY): Payer: Self-pay

## 2021-10-21 NOTE — Telephone Encounter (Signed)
Spoke to husband about procedures scheduled for tomorrow. Aware of nothing to eat or drink after midnight.Aware of holding Eliquis Tuesday. Holding Torsemide am of procedure and not to take Toujeo am of procedure. Has transportation to and from procedure.

## 2021-10-22 ENCOUNTER — Other Ambulatory Visit: Payer: Self-pay

## 2021-10-22 ENCOUNTER — Ambulatory Visit (HOSPITAL_COMMUNITY)
Admission: RE | Admit: 2021-10-22 | Discharge: 2021-10-22 | Disposition: A | Discharge status: Home / Self Care | Payer: Medicare Other | Attending: Cardiology | Admitting: Cardiology

## 2021-10-22 ENCOUNTER — Other Ambulatory Visit (HOSPITAL_COMMUNITY): Payer: Self-pay

## 2021-10-22 ENCOUNTER — Telehealth: Payer: Self-pay | Admitting: *Deleted

## 2021-10-22 ENCOUNTER — Ambulatory Visit (HOSPITAL_BASED_OUTPATIENT_CLINIC_OR_DEPARTMENT_OTHER): Payer: Medicare Other

## 2021-10-22 ENCOUNTER — Ambulatory Visit (HOSPITAL_COMMUNITY): Payer: Medicare Other | Admitting: General Practice

## 2021-10-22 ENCOUNTER — Encounter (HOSPITAL_COMMUNITY)
Admission: RE | Disposition: A | Discharge status: Home / Self Care | Payer: Self-pay | Source: Home / Self Care | Attending: Cardiology

## 2021-10-22 ENCOUNTER — Ambulatory Visit (HOSPITAL_BASED_OUTPATIENT_CLINIC_OR_DEPARTMENT_OTHER): Payer: Medicare Other | Admitting: General Practice

## 2021-10-22 ENCOUNTER — Ambulatory Visit (HOSPITAL_COMMUNITY)
Admission: RE | Disposition: A | Discharge status: Home / Self Care | Payer: Self-pay | Source: Home / Self Care | Attending: Cardiology

## 2021-10-22 DIAGNOSIS — I2510 Atherosclerotic heart disease of native coronary artery without angina pectoris: Secondary | ICD-10-CM

## 2021-10-22 DIAGNOSIS — I252 Old myocardial infarction: Secondary | ICD-10-CM | POA: Diagnosis not present

## 2021-10-22 DIAGNOSIS — Z7901 Long term (current) use of anticoagulants: Secondary | ICD-10-CM | POA: Insufficient documentation

## 2021-10-22 DIAGNOSIS — I272 Pulmonary hypertension, unspecified: Secondary | ICD-10-CM | POA: Diagnosis not present

## 2021-10-22 DIAGNOSIS — I255 Ischemic cardiomyopathy: Secondary | ICD-10-CM | POA: Insufficient documentation

## 2021-10-22 DIAGNOSIS — I493 Ventricular premature depolarization: Secondary | ICD-10-CM | POA: Diagnosis not present

## 2021-10-22 DIAGNOSIS — I130 Hypertensive heart and chronic kidney disease with heart failure and stage 1 through stage 4 chronic kidney disease, or unspecified chronic kidney disease: Secondary | ICD-10-CM | POA: Insufficient documentation

## 2021-10-22 DIAGNOSIS — I5022 Chronic systolic (congestive) heart failure: Secondary | ICD-10-CM | POA: Diagnosis not present

## 2021-10-22 DIAGNOSIS — I361 Nonrheumatic tricuspid (valve) insufficiency: Secondary | ICD-10-CM

## 2021-10-22 DIAGNOSIS — Z87891 Personal history of nicotine dependence: Secondary | ICD-10-CM | POA: Diagnosis not present

## 2021-10-22 DIAGNOSIS — I081 Rheumatic disorders of both mitral and tricuspid valves: Secondary | ICD-10-CM | POA: Diagnosis not present

## 2021-10-22 DIAGNOSIS — N183 Chronic kidney disease, stage 3 unspecified: Secondary | ICD-10-CM | POA: Diagnosis not present

## 2021-10-22 DIAGNOSIS — I110 Hypertensive heart disease with heart failure: Secondary | ICD-10-CM

## 2021-10-22 DIAGNOSIS — I4891 Unspecified atrial fibrillation: Secondary | ICD-10-CM | POA: Insufficient documentation

## 2021-10-22 DIAGNOSIS — Z794 Long term (current) use of insulin: Secondary | ICD-10-CM | POA: Insufficient documentation

## 2021-10-22 DIAGNOSIS — E1122 Type 2 diabetes mellitus with diabetic chronic kidney disease: Secondary | ICD-10-CM | POA: Insufficient documentation

## 2021-10-22 DIAGNOSIS — I5042 Chronic combined systolic (congestive) and diastolic (congestive) heart failure: Secondary | ICD-10-CM

## 2021-10-22 DIAGNOSIS — Z7984 Long term (current) use of oral hypoglycemic drugs: Secondary | ICD-10-CM | POA: Diagnosis not present

## 2021-10-22 DIAGNOSIS — I340 Nonrheumatic mitral (valve) insufficiency: Secondary | ICD-10-CM

## 2021-10-22 DIAGNOSIS — Z79899 Other long term (current) drug therapy: Secondary | ICD-10-CM | POA: Insufficient documentation

## 2021-10-22 HISTORY — PX: RIGHT HEART CATH: CATH118263

## 2021-10-22 HISTORY — PX: TEE WITHOUT CARDIOVERSION: SHX5443

## 2021-10-22 LAB — POCT I-STAT EG7
Acid-Base Excess: 5 mmol/L — ABNORMAL HIGH (ref 0.0–2.0)
Acid-Base Excess: 6 mmol/L — ABNORMAL HIGH (ref 0.0–2.0)
Bicarbonate: 30.6 mmol/L — ABNORMAL HIGH (ref 20.0–28.0)
Bicarbonate: 31.3 mmol/L — ABNORMAL HIGH (ref 20.0–28.0)
Calcium, Ion: 1.21 mmol/L (ref 1.15–1.40)
Calcium, Ion: 1.27 mmol/L (ref 1.15–1.40)
HCT: 29 % — ABNORMAL LOW (ref 36.0–46.0)
HCT: 31 % — ABNORMAL LOW (ref 36.0–46.0)
Hemoglobin: 10.5 g/dL — ABNORMAL LOW (ref 12.0–15.0)
Hemoglobin: 9.9 g/dL — ABNORMAL LOW (ref 12.0–15.0)
O2 Saturation: 50 %
O2 Saturation: 51 %
Potassium: 3.9 mmol/L (ref 3.5–5.1)
Potassium: 4.1 mmol/L (ref 3.5–5.1)
Sodium: 143 mmol/L (ref 135–145)
Sodium: 144 mmol/L (ref 135–145)
TCO2: 32 mmol/L (ref 22–32)
TCO2: 33 mmol/L — ABNORMAL HIGH (ref 22–32)
pCO2, Ven: 46.9 mmHg (ref 44–60)
pCO2, Ven: 49 mmHg (ref 44–60)
pH, Ven: 7.414 (ref 7.25–7.43)
pH, Ven: 7.422 (ref 7.25–7.43)
pO2, Ven: 27 mmHg — CL (ref 32–45)
pO2, Ven: 27 mmHg — CL (ref 32–45)

## 2021-10-22 LAB — BASIC METABOLIC PANEL
Anion gap: 9 (ref 5–15)
BUN: 22 mg/dL (ref 8–23)
CO2: 29 mmol/L (ref 22–32)
Calcium: 9.8 mg/dL (ref 8.9–10.3)
Chloride: 102 mmol/L (ref 98–111)
Creatinine, Ser: 1.58 mg/dL — ABNORMAL HIGH (ref 0.44–1.00)
GFR, Estimated: 35 mL/min — ABNORMAL LOW (ref >60)
Glucose, Bld: 98 mg/dL (ref 70–99)
Potassium: 3.7 mmol/L (ref 3.5–5.1)
Sodium: 140 mmol/L (ref 135–145)

## 2021-10-22 LAB — GLUCOSE, CAPILLARY: Glucose-Capillary: 102 mg/dL — ABNORMAL HIGH (ref 70–99)

## 2021-10-22 SURGERY — RIGHT HEART CATH
Anesthesia: LOCAL

## 2021-10-22 SURGERY — ECHOCARDIOGRAM, TRANSESOPHAGEAL
Anesthesia: Monitor Anesthesia Care

## 2021-10-22 MED ORDER — PROPOFOL 10 MG/ML IV BOLUS
INTRAVENOUS | Status: DC | PRN
  Administered 2021-10-22 (×2): 20 mg via INTRAVENOUS

## 2021-10-22 MED ORDER — SODIUM CHLORIDE 0.9 % IV SOLN: INTRAVENOUS | Status: DC

## 2021-10-22 MED ORDER — LIDOCAINE HCL (PF) 1 % IJ SOLN
INTRAMUSCULAR | Status: AC
  Filled 2021-10-22: qty 30

## 2021-10-22 MED ORDER — TORSEMIDE 20 MG PO TABS: 40.0000 mg | ORAL_TABLET | Freq: Two times a day (BID) | ORAL | 3 refills | Status: DC

## 2021-10-22 MED ORDER — SODIUM CHLORIDE 0.9 % IV SOLN: 250.0000 mL | INTRAVENOUS | Status: DC | PRN

## 2021-10-22 MED ORDER — POTASSIUM CHLORIDE CRYS ER 20 MEQ PO TBCR: 40.0000 meq | EXTENDED_RELEASE_TABLET | Freq: Every day | ORAL | 0 refills | Status: DC

## 2021-10-22 MED ORDER — LIDOCAINE 2% (20 MG/ML) 5 ML SYRINGE
INTRAMUSCULAR | Status: DC | PRN
  Administered 2021-10-22: 100 mg via INTRAVENOUS

## 2021-10-22 MED ORDER — HEPARIN (PORCINE) IN NACL 1000-0.9 UT/500ML-% IV SOLN
INTRAVENOUS | Status: AC
  Filled 2021-10-22: qty 500

## 2021-10-22 MED ORDER — PHENYLEPHRINE 80 MCG/ML (10ML) SYRINGE FOR IV PUSH (FOR BLOOD PRESSURE SUPPORT)
PREFILLED_SYRINGE | INTRAVENOUS | Status: DC | PRN
  Administered 2021-10-22 (×2): 80 ug via INTRAVENOUS

## 2021-10-22 MED ORDER — PROPOFOL 500 MG/50ML IV EMUL
INTRAVENOUS | Status: DC | PRN
  Administered 2021-10-22: 100 ug/kg/min via INTRAVENOUS

## 2021-10-22 MED ORDER — SODIUM CHLORIDE 0.9% FLUSH: 3.0000 mL | Freq: Two times a day (BID) | INTRAVENOUS | Status: DC

## 2021-10-22 MED ORDER — LIDOCAINE HCL (PF) 1 % IJ SOLN
INTRAMUSCULAR | Status: DC | PRN
  Administered 2021-10-22: 2 mL via INTRADERMAL

## 2021-10-22 MED ORDER — SODIUM CHLORIDE 0.9% FLUSH: 3.0000 mL | INTRAVENOUS | Status: DC | PRN

## 2021-10-22 SURGICAL SUPPLY — 7 items
CATH BALLN WEDGE 5F 110CM (CATHETERS) ×1 IMPLANT
GUIDEWIRE .025 260CM (WIRE) ×1 IMPLANT
PACK CARDIAC CATHETERIZATION (CUSTOM PROCEDURE TRAY) ×2 IMPLANT
SHEATH GLIDE SLENDER 4/5FR (SHEATH) ×1 IMPLANT
TRANSDUCER W/STOPCOCK (MISCELLANEOUS) ×2 IMPLANT
TUBING ART PRESS 72  MALE/FEM (TUBING) ×1
TUBING ART PRESS 72 MALE/FEM (TUBING) IMPLANT

## 2021-10-22 NOTE — Interval H&P Note (Signed)
History and Physical Interval Note:  10/22/2021 12:07 PM  Stephanie Franco  has presented today for surgery, with the diagnosis of heart failure.  The various methods of treatment have been discussed with the patient and family. After consideration of risks, benefits and other options for treatment, the patient has consented to  Procedure(s): RIGHT HEART CATH (N/A) as a surgical intervention.  The patient's history has been reviewed, patient examined, no change in status, stable for surgery.  I have reviewed the patient's chart and labs.  Questions were answered to the patient's satisfaction.     Genesis Paget [employer]

## 2021-10-22 NOTE — Interval H&P Note (Signed)
History and Physical Interval Note:  10/22/2021 1:09 PM  Stephanie Franco  has presented today for surgery, with the diagnosis of MITRAL REGURGITATION.  The various methods of treatment have been discussed with the patient and family. After consideration of risks, benefits and other options for treatment, the patient has consented to  Procedure(s): TRANSESOPHAGEAL ECHOCARDIOGRAM (TEE) (N/A) as a surgical intervention.  The patient's history has been reviewed, patient examined, no change in status, stable for surgery.  I have reviewed the patient's chart and labs.  Questions were answered to the patient's satisfaction.     Angalina Ante [employer]

## 2021-10-22 NOTE — Anesthesia Postprocedure Evaluation (Signed)
Anesthesia Post Note  Patient: Stephanie Franco  Procedure(s) Performed: TRANSESOPHAGEAL ECHOCARDIOGRAM (TEE)     Patient location during evaluation: PACU Anesthesia Type: MAC Level of consciousness: awake and alert Pain management: pain level controlled Vital Signs Assessment: post-procedure vital signs reviewed and stable Respiratory status: spontaneous breathing, nonlabored ventilation and respiratory function stable Cardiovascular status: blood pressure returned to baseline and stable Postop Assessment: no apparent nausea or vomiting Anesthetic complications: no   No notable events documented.  Last Vitals:  Vitals:   10/22/21 1345 10/22/21 1355  BP: 129/78 (!) 155/84  Pulse: (!) 105 (!) 118  Resp: 20 18  Temp:    SpO2: 99% 99%    Last Pain:  Vitals:   10/22/21 1355  TempSrc:   PainSc: 0-No pain                 Lynda Rainwater

## 2021-10-22 NOTE — Progress Notes (Signed)
  Echocardiogram Echocardiogram Transesophageal has been performed.  Stephanie Franco 10/22/2021, 2:04 PM

## 2021-10-22 NOTE — CV Procedure (Signed)
Procedure: TEE  Indication: Mitral regurgitation.   Sedation: Per anesthesiology  Findings: Please see echo section for full report.  Mildly dilated LV with normal wall thickness, diffuse hypokinesis with EF 30-35%.  Mildly dilated RV with moderate systolic dysfunction.  Moderate tricuspid regurgitation.  Moderate left atrial enlargement, no LA appendage thrombus.  Moderate right atrial enlargement.  No PFO/ASD by color doppler.  Trileaflet aortic valve with no stenosis or regurgitation.  Severe mitral regurgitation.  There is restriction of posterior leaflet in setting of dilation of the LA and the LV.  PISA ERO 0.39 cm^2.  There is flattening but not reversal of the pulmonary vein PW doppler systolic signal.  Normal caliber aorta with moderate (grade 3 plaque).   Severe functional MR, will assess for Mitraclip.   Loralie Champagne 10/22/2021 1:38 PM

## 2021-10-22 NOTE — Anesthesia Procedure Notes (Signed)
Procedure Name: MAC Date/Time: 10/22/2021 1:06 PM Performed by: Dorann Lodge, CRNA Pre-anesthesia Checklist: Patient identified, Emergency Drugs available, Suction available and Patient being monitored Patient Re-evaluated:Patient Re-evaluated prior to induction Oxygen Delivery Method: Nasal cannula Airway Equipment and Method: Bite block Dental Injury: Teeth and Oropharynx as per pre-operative assessment

## 2021-10-22 NOTE — Anesthesia Preprocedure Evaluation (Addendum)
Anesthesia Evaluation    Reviewed: Allergy & Precautions, Patient's Chart, lab work & pertinent test results  Airway Mallampati: II  TM Distance: >3 FB Neck ROM: Full    Dental no notable dental hx.    Pulmonary asthma , sleep apnea , former smoker,    Pulmonary exam normal breath sounds clear to auscultation       Cardiovascular hypertension, Pt. on medications + CAD, + Past MI, + Cardiac Stents and +CHF  Normal cardiovascular exam+ dysrhythmias Atrial Fibrillation + Valvular Problems/Murmurs MR  Rhythm:Regular Rate:Normal     Neuro/Psych negative neurological ROS     GI/Hepatic Neg liver ROS, GERD  Medicated and Controlled,  Endo/Other  diabetes  Renal/GU Renal disease     Musculoskeletal  (+) Arthritis , Gout   Abdominal (+) + obese,   Peds  Hematology  (+) Blood dyscrasia, ,   Anesthesia Other Findings   Reproductive/Obstetrics                            Anesthesia Physical Anesthesia Plan  ASA: 4  Anesthesia Plan: MAC   Post-op Pain Management:    Induction: Intravenous  PONV Risk Score and Plan: 2 and Propofol infusion and Treatment may vary due to age or medical condition  Airway Management Planned: Nasal Cannula  Additional Equipment:   Intra-op Plan:   Post-operative Plan:   Informed Consent: I have reviewed the patients History and Physical, chart, labs and discussed the procedure including the risks, benefits and alternatives for the proposed anesthesia with the patient or authorized representative who has indicated his/her understanding and acceptance.     Dental advisory given  Plan Discussed with: CRNA  Anesthesia Plan Comments:         Anesthesia Quick Evaluation

## 2021-10-22 NOTE — Transfer of Care (Signed)
Immediate Anesthesia Transfer of Care Note  Patient: Stephanie Franco  Procedure(s) Performed: TRANSESOPHAGEAL ECHOCARDIOGRAM (TEE)  Patient Location: Endoscopy Unit  Anesthesia Type:MAC  Level of Consciousness: drowsy  Airway & Oxygen Therapy: Patient Spontanous Breathing and Patient connected to nasal cannula oxygen  Post-op Assessment: Report given to RN and Post -op Vital signs reviewed and stable  Post vital signs: Reviewed and stable  Last Vitals:  Vitals Value Taken Time  BP 142/82 10/22/21 1240  Temp    Pulse 99 10/22/21 1240  Resp 14 10/22/21 1240  SpO2 99 % 10/22/21 1240    Last Pain:  Vitals:   10/22/21 1240  TempSrc: Temporal  PainSc: 0-No pain         Complications: No notable events documented.

## 2021-10-22 NOTE — Telephone Encounter (Signed)
Spoke with husband/patient and confirmed she saw the dentist on 10/20/21 and was told OK to have Zometa. Went to [facility] on [address]. [address] [phone]. Prefer to wait til late July for the infusion since so much is happening right now with her. Confirmed she did start the Letrozole. [place] and requested they fax a clearance to receive Zometa. Scheduling message sent to reschedule lab/OV/Zometa for late July (OK per Ned Card, NP).

## 2021-10-22 NOTE — Discharge Instructions (Addendum)
1. Increase torsemide to 40 mg twice a day.  2. Increase potassium to 40 mEq twice a day. TEE  YOU HAD AN CARDIAC PROCEDURE TODAY: Refer to the procedure report and other information in the discharge instructions given to you for any specific questions about what was found during the examination. If this information does not answer your questions, please call [place] office at [phone] to clarify.   DIET: Your first meal following the procedure should be a light meal and then it is ok to progress to your normal diet. A half-sandwich or bowl of soup is an example of a good first meal. Heavy or fried foods are harder to digest and may make you feel nauseous or bloated. Drink plenty of fluids but you should avoid alcoholic beverages for 24 hours. If you had a esophageal dilation, please see attached instructions for diet.   ACTIVITY: Your care partner should take you home directly after the procedure. You should plan to take it easy, moving slowly for the rest of the day. You can resume normal activity the day after the procedure however YOU SHOULD NOT DRIVE, use power tools, machinery or perform tasks that involve climbing or major physical exertion for 24 hours (because of the sedation medicines used during the test).   SYMPTOMS TO REPORT IMMEDIATELY: A cardiologist can be reached at any hour. Please call [phone] for any of the following symptoms:  Vomiting of blood or coffee ground material  New, significant abdominal pain  New, significant chest pain or pain under the shoulder blades  Painful or persistently difficult swallowing  New shortness of breath  Black, tarry-looking or red, bloody stools  FOLLOW UP:  Please also call with any specific questions about appointments or follow up tests.

## 2021-10-23 ENCOUNTER — Encounter (HOSPITAL_COMMUNITY): Payer: Self-pay | Admitting: Cardiology

## 2021-10-23 ENCOUNTER — Telehealth (HOSPITAL_COMMUNITY): Payer: Self-pay

## 2021-10-23 DIAGNOSIS — I5042 Chronic combined systolic (congestive) and diastolic (congestive) heart failure: Secondary | ICD-10-CM

## 2021-10-23 MED FILL — Heparin Sod (Porcine)-NaCl IV Soln 1000 Unit/500ML-0.9%: INTRAVENOUS | Qty: 500 | Status: AC

## 2021-10-23 NOTE — Telephone Encounter (Signed)
Lab scheduled °

## 2021-10-24 ENCOUNTER — Ambulatory Visit: Payer: Medicare Other | Admitting: Cardiology

## 2021-10-27 ENCOUNTER — Other Ambulatory Visit (HOSPITAL_COMMUNITY): Payer: Self-pay

## 2021-10-27 ENCOUNTER — Telehealth: Payer: Self-pay

## 2021-10-27 NOTE — Telephone Encounter (Signed)
[facility] review of 10/22/2021 TEE:  "For patient Stephanie Franco This is secondary MR  This valve is suitable for a MitraClip implant. The fossa looks approachable for transseptal puncture in the SAXB and Bicaval views. LA dimensions look small/challenging but doable for device steering and straddle. The defect is at A2/P2 with a posterior/laterally directed jet. The posterior leaflet measures 0.9-1.0 cm in the LVOT grasping view(s). MVA measures 4.79cm2 via planimetry & gradient measures 56mhg at a HR of 92bpm. I'd like to verify both MVA and gradient in the case prior to the start. Based on the posterior leaflet length & etiology,  I would plan to start with 1 NTW placed right at/just lateral of A2/P2 and assess for gradient & residual.   *Severe TR noted"

## 2021-10-29 ENCOUNTER — Ambulatory Visit (HOSPITAL_COMMUNITY)
Admission: RE | Admit: 2021-10-29 | Discharge: 2021-10-29 | Disposition: A | Discharge status: Home / Self Care | Payer: Medicare Other | Source: Ambulatory Visit | Attending: Internal Medicine | Admitting: Internal Medicine

## 2021-10-29 DIAGNOSIS — I5042 Chronic combined systolic (congestive) and diastolic (congestive) heart failure: Secondary | ICD-10-CM | POA: Diagnosis present

## 2021-10-29 LAB — BASIC METABOLIC PANEL
Anion gap: 7 (ref 5–15)
BUN: 28 mg/dL — ABNORMAL HIGH (ref 8–23)
CO2: 28 mmol/L (ref 22–32)
Calcium: 9.4 mg/dL (ref 8.9–10.3)
Chloride: 103 mmol/L (ref 98–111)
Creatinine, Ser: 1.79 mg/dL — ABNORMAL HIGH (ref 0.44–1.00)
GFR, Estimated: 30 mL/min — ABNORMAL LOW (ref >60)
Glucose, Bld: 205 mg/dL — ABNORMAL HIGH (ref 70–99)
Potassium: 4.1 mmol/L (ref 3.5–5.1)
Sodium: 138 mmol/L (ref 135–145)

## 2021-10-30 ENCOUNTER — Ambulatory Visit: Payer: PRIVATE HEALTH INSURANCE

## 2021-10-30 ENCOUNTER — Other Ambulatory Visit: Payer: PRIVATE HEALTH INSURANCE

## 2021-10-30 ENCOUNTER — Ambulatory Visit: Payer: PRIVATE HEALTH INSURANCE | Admitting: Oncology

## 2021-11-03 ENCOUNTER — Ambulatory Visit (INDEPENDENT_AMBULATORY_CARE_PROVIDER_SITE_OTHER): Payer: Medicare Other | Admitting: Physician Assistant

## 2021-11-03 ENCOUNTER — Encounter: Payer: Self-pay | Admitting: Physician Assistant

## 2021-11-03 VITALS — BP 130/70 | HR 94 | Ht 62.0 in | Wt 187.0 lb

## 2021-11-03 DIAGNOSIS — E785 Hyperlipidemia, unspecified: Secondary | ICD-10-CM | POA: Diagnosis not present

## 2021-11-03 DIAGNOSIS — E1169 Type 2 diabetes mellitus with other specified complication: Secondary | ICD-10-CM

## 2021-11-03 DIAGNOSIS — N1831 Chronic kidney disease, stage 3a: Secondary | ICD-10-CM

## 2021-11-03 DIAGNOSIS — I10 Essential (primary) hypertension: Secondary | ICD-10-CM

## 2021-11-03 NOTE — Assessment & Plan Note (Signed)
stable, continue current management, avoid nephrotoxic medicines like NSAIDS (Advil, Motrin, Ibuprofen, Aleve, Naproxen), lower your salt intake, reduce protein intake, increase fiber, drink 64 ounces of water a day; labs will be drawn to monitor this level every year.

## 2021-11-03 NOTE — Patient Instructions (Signed)
To schedule your routine screening mammogram you can call:  [facility] [address] [place], [place] [address] Ph: [phone] Fax: [phone]

## 2021-11-03 NOTE — Progress Notes (Signed)
New Patient Office Visit  Subjective:  Patient ID: Stephanie Franco, female    DOB: 1951/02/25  Age: 71 y.o. MRN: [id number]  CC:  Chief Complaint  Patient presents with   Acute Visit    New pt Get Est.    HPI Stephanie Franco with past medical history significant for HFrEF, stage IV breast cancer s/p left breast mastectomy (2008), CAD, CKD stage III, DM II and A fib. presents with her Husband to establish care; denies any acute issues; is on long term opioid treatment for  Numerous sites of abnormal tracer uptake consistent with scattered osseous metastases, including sites at the distal femoral diaphyses bilaterally and adjacent to the femoral component of the RIGHT hip prosthesis; recommended that she discuss opioid treatment with her oncologist.   Outpatient Encounter Medications as of 11/03/2021  Medication Sig   acetaminophen (TYLENOL) 500 MG tablet Take 1,000 mg by mouth every 6 (six) hours as needed for mild pain.   albuterol (PROVENTIL HFA;VENTOLIN HFA) 108 (90 Base) MCG/ACT inhaler Inhale 1-2 puffs into the lungs every 6 (six) hours as needed for wheezing or shortness of breath.   albuterol (PROVENTIL) (2.5 MG/3ML) 0.083% nebulizer solution Take 3 mLs (2.5 mg total) by nebulization every 6 (six) hours as needed for wheezing or shortness of breath.   allopurinol (ZYLOPRIM) 300 MG tablet Take 300 mg by mouth daily.   dapagliflozin propanediol (FARXIGA) 10 MG TABS tablet Take 1 tablet (10 mg total) by mouth daily.   ELIQUIS 5 MG TABS tablet TAKE 1 TABLET BY MOUTH TWICE A DAY   ezetimibe (ZETIA) 10 MG tablet Take 1 tablet (10 mg total) by mouth daily.   fexofenadine (ALLEGRA) 180 MG tablet Take 180 mg by mouth daily as needed for allergies.    fluticasone (FLONASE) 50 MCG/ACT nasal spray Place 2 sprays into the nose daily as needed for allergies or rhinitis.    gabapentin (NEURONTIN) 600 MG tablet Take 300 mg by mouth at bedtime.   glucose blood test strip 1 each by Other route as  needed for other. Use as instructed   hydrALAZINE (APRESOLINE) 25 MG tablet Take 0.5 tablets (12.5 mg total) by mouth 3 (three) times daily.   Hydrocortisone, Perianal, 1 % CREA Apply 1 application. topically 3 (three) times daily as needed (irritation).   isosorbide mononitrate (IMDUR) 60 MG 24 hr tablet Take 60 mg by mouth daily.   letrozole (FEMARA) 2.5 MG tablet Take 1 tablet (2.5 mg total) by mouth daily.   LORazepam (ATIVAN) 1 MG tablet Take 1 mg by mouth 2 (two) times daily as needed for anxiety.   Multiple Vitamins-Minerals (MULTIVITAMIN WITH MINERALS) tablet Take 1 tablet by mouth daily.   Naphazoline-Glycerin (CLEAR EYES MAX REDNESS RELIEF OP) Place 2 drops into both eyes daily as needed (redness).   nitroGLYCERIN (NITROSTAT) 0.4 MG SL tablet DISSOLVE 1 TABLET UNDER THE TONGUE EVERY 5 MINUTES AS NEEDED FOR CHEST PAIN. (Patient taking differently: Place 0.4 mg under the tongue every 5 (five) minutes as needed for chest pain.)   ondansetron (ZOFRAN) 4 MG tablet Take 1 tablet (4 mg total) by mouth every 6 (six) hours as needed for nausea.   OVER THE COUNTER MEDICATION Apply 1 application. topically daily as needed (pain). Apply to legs and feet   Oxycodone HCl 10 MG TABS Take 5 mg by mouth 3 (three) times daily as needed for pain.   pantoprazole (PROTONIX) 40 MG tablet TAKE 1 TABLET BY MOUTH EVERY DAY (Patient taking  differently: Take 40 mg by mouth daily.)   polyethylene glycol powder (GLYCOLAX/MIRALAX) 17 GM/SCOOP powder Take 17 g by mouth daily.   potassium chloride SA (KLOR-CON M) 20 MEQ tablet Take 2 tablets (40 mEq total) by mouth daily.   rosuvastatin (CRESTOR) 40 MG tablet Take 40 mg by mouth daily.   senna-docusate (SENOKOT-S) 8.6-50 MG tablet Take 1 tablet by mouth 2 (two) times daily.   spironolactone (ALDACTONE) 25 MG tablet Take 1 tablet (25 mg total) by mouth daily.   torsemide (DEMADEX) 20 MG tablet Take 2 tablets (40 mg total) by mouth 2 (two) times daily. '40mg'$  in the morning  and 20 mg in the evening alternating with 20 mg in the morning and 20 mg in the evening   TOUJEO SOLOSTAR 300 UNIT/ML Solostar Pen Inject 30 Units into the skin 2 (two) times daily.   triamcinolone ointment (KENALOG) 0.5 % Apply to legs 2 times daily for 2 weeks (Patient taking differently: Apply 1 application  topically 2 (two) times daily as needed (Irritation).)   Biotin 1000 MCG tablet Take 1,000 mcg by mouth daily.   losartan (COZAAR) 25 MG tablet Take 12.5 mg by mouth daily.   nystatin (MYCOSTATIN/NYSTOP) powder nystatin 100,000 unit/gram topical powder  APPLY 1 GRAM TO THE AFFECTED AREA(S) BY TOPICAL ROUTE 2 TIMES PER DAY.   No facility-administered encounter medications on file as of 11/03/2021.    Past Medical History:  Diagnosis Date   Aortic atherosclerosis (HCC)    Arthritis    Asthma    Atrial fibrillation, permanent ([place])    Rate control with Bystolic. CHA2DS2Vasc = 6 (HTN, DM, CHF, Age 26, Female) -> on Pradaxa   Breast cancer ([place]) 2008   S/P mastectomy   CAD S/P percutaneous coronary angioplasty 2006   PCI of circumflex with Taxus DES;; relook-cath Feb 2013: 50-60% short lesion in RCA, 40% ISR Circumflex stent.;  06/2021: Patent LCx stent.  Distal LCx 90% stenosis, proximal-mid RCA 60%.   CHF (congestive heart failure), NYHA class II, chronic, combined ([place])    CKD (chronic kidney disease), stage III (HCC)    Complication of anesthesia    prolonged sedation after colonoscopy in [place]   Diabetes mellitus, uncontrolled 07/14/2011   Dilated cardiomyopathy ([place]) 06/2021   2019: EF down to 25% improved to 50 to 55% x 2020. => Recurrent cardiomyopathy 06/2021 => EF 25 to 30% on Echo, CMR showed severe BiV failure (LVEF 36%, diffuse HK; RVEF 32%).  Severe biatrial enlargement.  Moderate MR posterior directed.  Suspect functional (suggested by TEE); noncoronary pattern for delayed gadolinium-suggest myocarditis, & prior Ant MI-subendocardial => Mixed cardiomyopathy   Edema of both  legs    Usually mild, chronic. Controlled with when necessary furosemide and diet   Fibroid, uterine 07/13/2011   "have that now"   GERD (gastroesophageal reflux disease)    Gout    Hyperlipidemia    Hypertension    Hypokalemia    Morbid obesity ([place])    BMI 41   OSA on CPAP    On CPAP   Pulmonary hypertension, unspecified (HCC)    PAP ~90 mmHg on Echo 12/2017 -- has OSA on CPAP & obesity -> follow-up sleep study showed mild OSA.;  09/22/2021: RHC showed PAP of 86/30 mmHg with LVEDP of 14 mmHg.   Systolic murmur     Past Surgical History:  Procedure Laterality Date   Cardiac MRI  06/24/2021   Severe biventricular failure: LVEF 36%-diffuse HK. RV EF 32%. Severe BiA  Enlargement. Mod MR. LGE in septum - non-coronary pattern -? Myocarditis. Anterior wall LGE is subendocardial - ? Prior MI.   COLONOSCOPY     CORONARY ANGIOPLASTY WITH STENT PLACEMENT  2006   stent to circumflex   DILATION AND CURETTAGE OF UTERUS     LEFT HEART CATHETERIZATION WITH CORONARY ANGIOGRAM N/A 07/13/2011   Procedure: LEFT HEART CATHETERIZATION WITH CORONARY ANGIOGRAM;  Surgeon: Leonie Man, MD;  Location[facility] C[facility] LAB;  Service: Cardiovascular;  normal EF; 74m 50-60% lesion in RCA; patent stent in circumflex form 2006 with ~40% in-stent re-stenosis   MASTECTOMY  2008   left   NM MYOVIEW LTD  7/'16; 6/'21   LOW RISK. No ischemia or infarction. Not gated 2/2  A. fib - unable to assess EF   RIGHT HEART CATH N/A 10/22/2021   Procedure: RIGHT HEART CATH;  Surgeon: MLarey Dresser MD;  [facility];  Service: Cardiovascular;  Laterality: N/A;   RIGHT/LEFT HEART CATH AND CORONARY ANGIOGRAPHY N/A 06/23/2021   Procedure: RIGHT/LEFT HEART CATH AND CORONARY ANGIOGRAPHY;  Surgeon: BLorretta Harp MD;  [facility];  Service: Cardiovascular; p-mRCA 60%.  Distal LCx 90%. mLAD stent patent.  RAP 22 RVP 70/1 mmHg with PAP 80/30 mmHg. LVEDP 14 mmHg-unable to assess PCWP.   TEE WITHOUT  CARDIOVERSION N/A 06/25/2021   Procedure: TRANSESOPHAGEAL ECHOCARDIOGRAM (TEE);  Surgeon: MLarey Dresser MD;  [facility]; EF 35 to 40% with global HK.  Mildly reduced RV function.  Severely dilated left atrium with no LAA thrombus.  ?  Mild RA dilation.  Posterior MVL restriction w/ moderate eccentric/posterior directed MR.  No PV reversal. ~ functional MR 2/2 Afib.  Mild dilation of the AscAo (~ 40 mm)   TEE WITHOUT CARDIOVERSION N/A 10/22/2021   Procedure: TRANSESOPHAGEAL ECHOCARDIOGRAM (TEE);  Surgeon: MLarey Dresser MD;  [facility];  Service: Cardiovascular;  Laterality: N/A;   TOTAL HIP ARTHROPLASTY Right 01/19/2018   Procedure: RIGHT TOTAL HIP ARTHROPLASTY ANTERIOR APPROACH;  Surgeon: SRod Can MD;  Location: [facility] ORS;  Service: Orthopedics;  Laterality: Right;  Needs RNFA   TRANSTHORACIC ECHOCARDIOGRAM  6/'15; 9/'16   a) In setting of Urosepsis: EF ~25 %, global HK (poor quality study);; b) LV EF 40-45%, Mild Anteroseptal HK. Mod-Severe  RA dilation with elevated PA Pressures (~~peak 63 mmHg).. Mild LA dilation   TRANSTHORACIC ECHOCARDIOGRAM  12/2017; 04/15/2018   a) Improved LVEF to 45 to 50%.  Pulmonary HTN ~PAP ~ 60 mmHg. Severe LA dilation& mild /rv dilation.; b) Mod LVH. EF 40-45% (back to prior EF). Unable to assess DF (Afib). Severe R&L Atrial enlargement.  Moderate Post-Lateral directed MR with Mod RV dilation.  PAP ~66 mmHg.;    TRANSTHORACIC ECHOCARDIOGRAM  01/2019   relook Echo: EF back up to 50 to 55%.  Indeterminate diastolic parameters.  Moderately dilated left atrium and right atrium.  Moderate MR.  Mild to moderate TR.   TRANSTHORACIC ECHOCARDIOGRAM  06/21/2021   EF 25 to 30%.  Severely reduced function.  Paradoxical septal motion consistent with LBBB.  Unable to assess diastolic pressures.  Severe biatrial dilation.  Mildly elevated PA P estimated 43.5 mmHg-with RAP estimated at 8 mmHg..  Moderate posteriorly directed MR.    Family History   Problem Relation Age of Onset   Coronary artery disease Mother    Hypertension Mother    Heart disease Father    Atrial fibrillation Son    Lung cancer Sister  Heart disease Brother    Heart disease Brother    Healthy Son    Thyroid disease Daughter    Healthy Daughter    Healthy Daughter     Social History   Socioeconomic History   Marital status: Married    Spouse name: Not on file   Number of children: 4   Years of education: Not on file   Highest education level: Not on file  Occupational History   Not on file  Tobacco Use   Smoking status: Former    Packs/day: 0.50    Years: 6.00    Total pack years: 3.00    Types: Cigarettes    Quit date: 05/18/1992    Years since quitting: 29.4   Smokeless tobacco: Never   Tobacco comments:    Former smoker 08/15/21  Vaping Use   Vaping Use: Never used  Substance and Sexual Activity   Alcohol use: No    Comment: 07/13/11 "have drank occasionally; not now"   Drug use: No   Sexual activity: Not Currently    Birth control/protection: Surgical  Other Topics Concern   Not on file  Social History Narrative   She is a married mother of 5, grandmother 2. Usually accompanied by her husband. She does not work. She had been working on her exercise, but is no longer as active. Does not drink and does not smoke   Social Determinants of Radio broadcast assistant Strain: Not on file  Food Insecurity: Not on file  Transportation Needs: Not on file  Physical Activity: Not on file  Stress: Not on file  Social Connections: Not on file  Intimate Partner Violence: Not on file    ROS Review of Systems  Constitutional:  Negative for activity change and chills.  HENT:  Negative for congestion and voice change.   Eyes:  Negative for pain and redness.  Respiratory:  Negative for cough and wheezing.   Cardiovascular:  Negative for chest pain.  Gastrointestinal:  Negative for constipation, diarrhea, nausea and vomiting.  Endocrine:  Negative for polyuria.  Genitourinary:  Negative for frequency.  Skin:  Negative for color change and rash.  Allergic/Immunologic: Negative for immunocompromised state.  Neurological:  Negative for dizziness.  Psychiatric/Behavioral:  Negative for agitation.     Objective:   Today's Vitals: BP 130/70   Pulse 94   Ht '5\' 2"'$  (1.575 m)   Wt 187 lb (84.8 kg)   SpO2 96%   BMI 34.20 kg/m   Physical Exam Vitals and nursing note reviewed.  Constitutional:      General: She is not in acute distress.    Appearance: Normal appearance. She is not ill-appearing.  HENT:     Head: Normocephalic and atraumatic.     Right Ear: External ear normal.     Left Ear: External ear normal.     Nose: No congestion.  Eyes:     Extraocular Movements: Extraocular movements intact.     Conjunctiva/sclera: Conjunctivae normal.     Pupils: Pupils are equal, round, and reactive to light.  Cardiovascular:     Rate and Rhythm: Normal rate and regular rhythm.     Pulses: Normal pulses.     Heart sounds: Normal heart sounds.  Pulmonary:     Effort: Pulmonary effort is normal.     Breath sounds: Normal breath sounds. No wheezing.  Abdominal:     General: Bowel sounds are normal.     Palpations: Abdomen is soft.  Musculoskeletal:  General: Normal range of motion.     Cervical back: Normal range of motion and neck supple.     Right lower leg: No edema.     Left lower leg: No edema.  Skin:    General: Skin is warm and dry.     Findings: No bruising.  Neurological:     General: No focal deficit present.     Mental Status: She is alert and oriented to person, place, and time.  Psychiatric:        Mood and Affect: Mood normal.        Behavior: Behavior normal.        Thought Content: Thought content normal.       Assessment & Plan:   Problem List Items Addressed This Visit       Cardiovascular and Mediastinum   Essential hypertension - Primary (Chronic)     controlled, continue current  management, eat a low salt diet, do not add any salt to food when cooking, avoid processed foods, avoid fried foods       Relevant Medications   losartan (COZAAR) 25 MG tablet     Endocrine   Hyperlipidemia associated with type 2 diabetes mellitus (HCC) (Chronic)     controlled, continue current management, eat a low fat diet, increase fiber intake (Benefiber or Metamucil, Cherrios,  oatmeal, beans, nuts, fruits and vegetables), limit saturated fats (in fried foods, red meat), can add OTC fish oil supplement, eat fish with Omega-3 fatty acids like salmon and tuna, exercise for 30 minutes 3 - 5 times a week, drink 8 - 10 glasses of water a day        Relevant Medications   losartan (COZAAR) 25 MG tablet     Genitourinary   Chronic kidney disease (CKD), stage III (moderate) (HCC) (Chronic)    stable, continue current management, avoid nephrotoxic medicines like NSAIDS (Advil, Motrin, Ibuprofen, Aleve, Naproxen), lower your salt intake, reduce protein intake, increase fiber, drink 64 ounces of water a day; labs will be drawn to monitor this level every year.        Follow-up: Return in about 3 months (around 02/03/2022) for Return for a Follow Up Appointment Chronic Disease.   Irene Pap, PA-C

## 2021-11-03 NOTE — Assessment & Plan Note (Signed)
controlled, continue current management, eat a low salt diet, do not add any salt to food when cooking, avoid processed foods, avoid fried foods

## 2021-11-03 NOTE — Assessment & Plan Note (Signed)
controlled, continue current management, eat a low fat diet, increase fiber intake (Benefiber or Metamucil, Cherrios,  oatmeal, beans, nuts, fruits and vegetables), limit saturated fats (in fried foods, red meat), can add OTC fish oil supplement, eat fish with Omega-3 fatty acids like salmon and tuna, exercise for 30 minutes 3 - 5 times a week, drink 8 - 10 glasses of water a day

## 2021-11-10 ENCOUNTER — Telehealth: Payer: Self-pay

## 2021-11-12 ENCOUNTER — Encounter (HOSPITAL_COMMUNITY): Payer: Self-pay

## 2021-11-12 ENCOUNTER — Ambulatory Visit (HOSPITAL_COMMUNITY)
Admission: RE | Admit: 2021-11-12 | Discharge: 2021-11-12 | Disposition: A | Discharge status: Home / Self Care | Payer: Medicare Other | Source: Ambulatory Visit | Attending: Family Medicine | Admitting: Family Medicine

## 2021-11-12 VITALS — BP 124/74 | HR 104 | Wt 187.0 lb

## 2021-11-12 DIAGNOSIS — I5023 Acute on chronic systolic (congestive) heart failure: Secondary | ICD-10-CM

## 2021-11-12 DIAGNOSIS — Z452 Encounter for adjustment and management of vascular access device: Secondary | ICD-10-CM | POA: Insufficient documentation

## 2021-11-12 DIAGNOSIS — I2510 Atherosclerotic heart disease of native coronary artery without angina pectoris: Secondary | ICD-10-CM | POA: Insufficient documentation

## 2021-11-12 DIAGNOSIS — Z96641 Presence of right artificial hip joint: Secondary | ICD-10-CM | POA: Diagnosis not present

## 2021-11-12 DIAGNOSIS — N1831 Chronic kidney disease, stage 3a: Secondary | ICD-10-CM

## 2021-11-12 DIAGNOSIS — E785 Hyperlipidemia, unspecified: Secondary | ICD-10-CM | POA: Diagnosis not present

## 2021-11-12 DIAGNOSIS — I4821 Permanent atrial fibrillation: Secondary | ICD-10-CM

## 2021-11-12 DIAGNOSIS — I130 Hypertensive heart and chronic kidney disease with heart failure and stage 1 through stage 4 chronic kidney disease, or unspecified chronic kidney disease: Secondary | ICD-10-CM | POA: Insufficient documentation

## 2021-11-12 DIAGNOSIS — R7989 Other specified abnormal findings of blood chemistry: Secondary | ICD-10-CM | POA: Diagnosis not present

## 2021-11-12 DIAGNOSIS — Z79899 Other long term (current) drug therapy: Secondary | ICD-10-CM | POA: Diagnosis not present

## 2021-11-12 DIAGNOSIS — I272 Pulmonary hypertension, unspecified: Secondary | ICD-10-CM

## 2021-11-12 DIAGNOSIS — I2721 Secondary pulmonary arterial hypertension: Secondary | ICD-10-CM | POA: Insufficient documentation

## 2021-11-12 DIAGNOSIS — C50919 Malignant neoplasm of unspecified site of unspecified female breast: Secondary | ICD-10-CM | POA: Insufficient documentation

## 2021-11-12 DIAGNOSIS — I428 Other cardiomyopathies: Secondary | ICD-10-CM | POA: Diagnosis not present

## 2021-11-12 DIAGNOSIS — E1122 Type 2 diabetes mellitus with diabetic chronic kidney disease: Secondary | ICD-10-CM | POA: Diagnosis not present

## 2021-11-12 DIAGNOSIS — Z955 Presence of coronary angioplasty implant and graft: Secondary | ICD-10-CM | POA: Diagnosis not present

## 2021-11-12 DIAGNOSIS — I340 Nonrheumatic mitral (valve) insufficiency: Secondary | ICD-10-CM | POA: Diagnosis not present

## 2021-11-12 DIAGNOSIS — I493 Ventricular premature depolarization: Secondary | ICD-10-CM | POA: Diagnosis not present

## 2021-11-12 DIAGNOSIS — Z853 Personal history of malignant neoplasm of breast: Secondary | ICD-10-CM | POA: Diagnosis not present

## 2021-11-12 DIAGNOSIS — Z9861 Coronary angioplasty status: Secondary | ICD-10-CM

## 2021-11-12 DIAGNOSIS — I252 Old myocardial infarction: Secondary | ICD-10-CM | POA: Diagnosis not present

## 2021-11-12 DIAGNOSIS — N183 Chronic kidney disease, stage 3 unspecified: Secondary | ICD-10-CM | POA: Diagnosis not present

## 2021-11-12 DIAGNOSIS — Z7901 Long term (current) use of anticoagulants: Secondary | ICD-10-CM | POA: Diagnosis not present

## 2021-11-12 LAB — BASIC METABOLIC PANEL
Anion gap: 11 (ref 5–15)
BUN: 28 mg/dL — ABNORMAL HIGH (ref 8–23)
CO2: 27 mmol/L (ref 22–32)
Calcium: 9.6 mg/dL (ref 8.9–10.3)
Chloride: 101 mmol/L (ref 98–111)
Creatinine, Ser: 1.78 mg/dL — ABNORMAL HIGH (ref 0.44–1.00)
GFR, Estimated: 30 mL/min — ABNORMAL LOW (ref >60)
Glucose, Bld: 86 mg/dL (ref 70–99)
Potassium: 3.9 mmol/L (ref 3.5–5.1)
Sodium: 139 mmol/L (ref 135–145)

## 2021-11-12 LAB — BRAIN NATRIURETIC PEPTIDE: B Natriuretic Peptide: 91.4 pg/mL (ref 0.0–100.0)

## 2021-11-12 MED ORDER — METOLAZONE 2.5 MG PO TABS: 2.5000 mg | ORAL_TABLET | Freq: Once | ORAL | 0 refills | Status: DC

## 2021-11-12 MED ORDER — TORSEMIDE 20 MG PO TABS: 40.0000 mg | ORAL_TABLET | Freq: Two times a day (BID) | ORAL | 3 refills | Status: DC

## 2021-11-12 NOTE — Progress Notes (Signed)
ReDS Vest / Clip - 11/12/21 1400       ReDS Vest / Clip   Station Marker A    Ruler Value 25.5    ReDS Value Range High volume overload    ReDS Actual Value 47

## 2021-11-12 NOTE — Patient Instructions (Addendum)
EKG done today.  Labs done today. We will contact you only if your labs are abnormal.  INCREASE Torsemide to '40mg'$  (2 tablets) by mouth 2 times daily.   Tomorrow take Metolazone 2.'5mg'$  (1 tablet) by mouth daily with an extra 37mq(2 tablets) of Potassium.  No other medication changes were made. Please continue all current medications as prescribed.  Your physician recommends that you schedule a follow-up appointment in: 2 weeks with our NP/[facility] Clinic here in our office and in 3 months with Dr. MAundra Dubin  If you have any questions or concerns before your next appointment please send [place] message through [place] call our office at [phone]    TO LEAVE A MESSAGE FOR THE NURSE SELECT OPTION 2, PLEASE LEAVE A MESSAGE INCLUDING: YOUR NAME DATE OF BIRTH CALL BACK NUMBER REASON FOR CALL**this is important as we prioritize the call backs  YOU WILL RECEIVE A CALL BACK THE SAME DAY AS LONG AS YOU CALL BEFORE 4:00 PM   Do the following things EVERYDAY: Weigh yourself in the morning before breakfast. Write it down and keep it in a log. Take your medicines as prescribed Eat low salt foods--Limit salt (sodium) to 2000 mg per day.  Stay as active as you can everyday Limit all fluids for the day to less than 2 liters   [facility] and your health needs are our priority. As part of our continuing mission to provide you with exceptional heart care, we have created designated Provider Care Teams. These Care Teams include your primary Cardiologist (physician) and Advanced Practice Providers (APPs- Physician Assistants and Nurse Practitioners) who all work together to provide you with the care you need, when you need it.   You may see any of the following providers on your designated Care Team at your next follow up: Dr DGlori BickersDr DHaynes Kerns NP BLyda Jester PUtahLAudry Riles PharmD   Please be sure to bring in all your medications bottles  to every appointment.

## 2021-11-12 NOTE — Progress Notes (Signed)
ADVANCED HF CLINIC NOTE   Primary Care: Irene Pap, PA-C Primary Cardiologist: Dr. Ellyn Hack HF Cardiologist: Dr. Aundra Dubin  HPI: [name] is a 71 y.o. with history of permanent atrial fibrillation for 20 years, DMII, HTN, Hyperlipidemia, left breast cancer (had mastectomy 20 years ago), right hip replacement, arthritis,  CAD with prior PCI OM in 1740 and systolic heart failure.    Had sleep study 05/2021 - negative for sleep apnea.   She was admitted 06/20/21 with chest pain, but also noted to have orthopnea and worsening dyspnea. HS Trop 71>58>51. Echo showed EF 25-30%, moderate RV dysfunction, moderate biatrial enlargement, at least moderate posteriorly-directed MR. LHC/RHC showed stable CAD with elevated R>L heart filling pressures and low cardiac index 1.89.  AHF team consulted. She was started on milrinone, PICC placed. Started on IV lasix and added amiodarone given frequent PVCs. Underwent further w/u of pulmonary hypertension and HF. V/Q scan was not suggestive of chronic PE.  Serologic workup negative (ANA, anti-centromere ab, anti-scl70, RF). TEE was done to better assess severity of her MR. This showed restriction of the posterior mitral leaflet and moderate eccentric mitral regurgitation (posteriorly-directed), normal LV size w/ global HK EF 35-40%, RV size normal w/ moderate systolic dysfunction, severe LAE. Suspect functional MR, possibly atrial functional MR. 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  LVEF on MRI 36%, MR moderate. She was weaned off of milrinone w/ stable co-ox and transition to PO diuretics, torsemide 40 mg daily. GDMT added. SCr stabilized ~1.9. She was discharged home, weight 188 lbs.  Post hospital follow up 2/23, mildly volume up and instructed to take extra 20 mg of torsemide x 2 day and Imdur increased. SCr stable at 1.58  Seen in ED 3/23 with CP, SOB and elevated SCr.  Cards saw, felt she was on the dry side. Given gentle IVF, digoxin and amio stopped due to bradycardia/junctional rhythm, and hydralazine stopped due to fatigue.   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  Biopsy was done showing breast cancer primary. She has been started on letrozole.   Echo 5/23 showed EF 30-35%, mildly dilated RV with mild RV systolic dysfunction, PASP 67 mmHg, moderate-severe functional MR with posterior leaflet restriction and dilated IVC.  She has been off losartan, and torsemide has been decreased to 20 mg bid due to elevated creatinine (most recently 2.08).   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 TEE showed EF 30-35%, moderate RV systolic dysfunction, moderate TR, severe MR. Plan to assess for MitraClip.  Today she returns for post [place] HF follow up. Overall feeling more short of breath. She feels bloated and her weight is trending up. Denies palpitations, CP, dizziness, or PND/Orthopnea. Appetite ok. No fever or chills. Weight at home 181 pounds. Taking all medications.   ReDs: 47%  ECG (personally reviewed):  Atrial fibrillation, PVCs 97 bpm  Labs (2/23): K 4.0, creatinine 1.97 Labs (3/23): K 3.9, creatinine 1.45 Labs (5/23): K 4.7, creatinine 2.08 Labs (6/23): K 4.1, creatinine 1.79  PMH: 1. Type 2 diabetes 2. Breast cancer: Mastectomy 2008.  Bony metastases found 5/23, biopsy showed breast primary.  3. CKD stage 3 4. Atrial fibrillation: Permanent.  5. CAD: DES to LCx in 2006.  - LHC (2/23): 60% proximal RCA, 90% distal LCx.  Medical  management.  6. Gout 7. Hyperlipidemia 8. HTN 9. Chronic systolic CHF: Primarily nonischemic cardiomyopathy.  - Echo (2019): EF 40-45% RV moderately dilated.  - Echo (2020): EF 50-55%  LA and RA moderately dilated. - Echo (2/23): EF  25%,  RV moderately reduced systolic function, LA severely dilated, RA moderately dilated, MV restricted posterior leaflet  with moderate MR.  - cMRI (2/23): Findings suggest mixed ischemic/nonischemic cardiomyopathy (?myocarditis with prior coronary disease).  LVEF on MRI 36%. MR moderate.  - RHC (2/23): RA 21, PA 80/30, LVEDP 14, CI 1.9, PVR 11 WU. - Echo (5/23): EF 30-35%, mildly dilated RV with mild RV systolic dysfunction, PASP 67 mmHg, moderate-severe functional MR with posterior leaflet restriction and dilated IVC.  10. Mitral regurgitation: Suspect functional MR, possibly atrial functional MR.  - Moderate-severe MR with restricted posterior leaflet on 5/23 echo.  - TEE (6/23) showed severe MR with restricted posterior leaflet in setting of dilation of the LA and LV. 11. Pulmonary hypertension:  - RHC (2/23): RA 21, PA 80/30, LVEDP 14, CI 1.9, PVR 11 WU. - VQ scan (2/23): no evidence for chronic PE - Sleep study negative - Serologic workup negative (ANA, anti-centromere ab, anti-scl70, RF) - CT chest (5/23) did not show ILD.  - RHC (6/23): RA 16, PA 86/41, PCWP 27, CI 2.36, PVR 6.5 WU 12. PVCs   Current Outpatient Medications  Medication Sig Dispense Refill   acetaminophen (TYLENOL) 500 MG tablet Take 1,000 mg by mouth every 6 (six) hours as needed for mild pain.     albuterol (PROVENTIL HFA;VENTOLIN HFA) 108 (90 Base) MCG/ACT inhaler Inhale 1-2 puffs into the lungs every 6 (six) hours as needed for wheezing or shortness of breath. 1 Inhaler 0   albuterol (PROVENTIL) (2.5 MG/3ML) 0.083% nebulizer solution Take 3 mLs (2.5 mg total) by nebulization every 6 (six) hours as needed for wheezing or shortness of breath. 75 mL 12   allopurinol (ZYLOPRIM) 300 MG tablet Take 300 mg by mouth daily.     Biotin 1000 MCG tablet Take 1,000 mcg by mouth daily.     dapagliflozin propanediol (FARXIGA) 10 MG TABS tablet Take 1 tablet (10 mg total) by mouth daily. 30 tablet 5   ELIQUIS 5 MG TABS tablet  TAKE 1 TABLET BY MOUTH TWICE A DAY 180 tablet 2   ezetimibe (ZETIA) 10 MG tablet Take 1 tablet (10 mg total) by mouth daily. 30 tablet 5   fexofenadine (ALLEGRA) 180 MG tablet Take 180 mg by mouth daily as needed for allergies.   3   fluticasone (FLONASE) 50 MCG/ACT nasal spray Place 2 sprays into the nose daily as needed for allergies or rhinitis.      gabapentin (NEURONTIN) 600 MG tablet Take 300 mg by mouth at bedtime.     glucose blood test strip 1 each by Other route as needed for other. Use as instructed     hydrALAZINE (APRESOLINE) 25 MG tablet Take 0.5 tablets (12.5 mg total) by mouth 3 (three) times daily. 80 tablet 4   Hydrocortisone, Perianal, 1 % CREA Apply 1 application. topically 3 (three) times daily as needed (irritation).     isosorbide mononitrate (IMDUR) 60 MG 24 hr tablet Take 60 mg by mouth daily.     letrozole (FEMARA) 2.5 MG tablet Take 1 tablet (2.5 mg total) by mouth daily. 30 tablet 5   LORazepam (ATIVAN) 1 MG tablet Take 1 mg by mouth 2 (two) times daily as needed for anxiety.     metolazone (  ZAROXOLYN) 2.5 MG tablet Take 1 tablet (2.5 mg total) by mouth once for 1 dose. 1 tablet 0   Multiple Vitamins-Minerals (MULTIVITAMIN WITH MINERALS) tablet Take 1 tablet by mouth daily.     Naphazoline-Glycerin (CLEAR EYES MAX REDNESS RELIEF OP) Place 2 drops into both eyes daily as needed (redness).     nitroGLYCERIN (NITROSTAT) 0.4 MG SL tablet DISSOLVE 1 TABLET UNDER THE TONGUE EVERY 5 MINUTES AS NEEDED FOR CHEST PAIN. 25 tablet 6   nystatin (MYCOSTATIN/NYSTOP) powder nystatin 100,000 unit/gram topical powder  APPLY 1 GRAM TO THE AFFECTED AREA(S) BY TOPICAL ROUTE 2 TIMES PER DAY.     ondansetron (ZOFRAN) 4 MG tablet Take 1 tablet (4 mg total) by mouth every 6 (six) hours as needed for nausea. 20 tablet 0   OVER THE COUNTER MEDICATION Apply 1 application. topically daily as needed (pain). Apply to legs and feet     Oxycodone HCl 10 MG TABS Take 5 mg by mouth 3 (three) times daily  as needed for pain.     pantoprazole (PROTONIX) 40 MG tablet TAKE 1 TABLET BY MOUTH EVERY DAY 90 tablet 3   polyethylene glycol powder (GLYCOLAX/MIRALAX) 17 GM/SCOOP powder Take 17 g by mouth daily. (Patient taking differently: Take 17 g by mouth 2 (two) times daily. As needed) 238 g 0   potassium chloride SA (KLOR-CON M) 20 MEQ tablet Take 2 tablets (40 mEq total) by mouth daily. 30 tablet 0   rosuvastatin (CRESTOR) 40 MG tablet Take 40 mg by mouth daily.     senna-docusate (SENOKOT-S) 8.6-50 MG tablet Take 1 tablet by mouth 2 (two) times daily. 60 tablet 0   spironolactone (ALDACTONE) 25 MG tablet Take 1 tablet (25 mg total) by mouth daily. 30 tablet 0   TOUJEO SOLOSTAR 300 UNIT/ML Solostar Pen Inject 30 Units into the skin 2 (two) times daily.     triamcinolone ointment (KENALOG) 0.5 % Apply to legs 2 times daily for 2 weeks (Patient taking differently: Apply 1 application  topically 2 (two) times daily as needed (Irritation).) 30 g 0   losartan (COZAAR) 25 MG tablet Take 12.5 mg by mouth daily. (Patient not taking: Reported on 11/12/2021)     torsemide (DEMADEX) 20 MG tablet Take 2 tablets (40 mg total) by mouth 2 (two) times daily. 360 tablet 3   No current facility-administered medications for this encounter.   No Known Allergies  Social History   Socioeconomic History   Marital status: Married    Spouse name: Not on file   Number of children: 4   Years of education: Not on file   Highest education level: Not on file  Occupational History   Not on file  Tobacco Use   Smoking status: Former    Packs/day: 0.50    Years: 6.00    Total pack years: 3.00    Types: Cigarettes    Quit date: 05/18/1992    Years since quitting: 29.5   Smokeless tobacco: Never   Tobacco comments:    Former smoker 08/15/21  Vaping Use   Vaping Use: Never used  Substance and Sexual Activity   Alcohol use: No    Comment: 07/13/11 "have drank occasionally; not now"   Drug use: No   Sexual activity: Not  Currently    Birth control/protection: Surgical  Other Topics Concern   Not on file  Social History Narrative   She is a married mother of 62, grandmother 2. Usually accompanied by her husband. She does not  work. She had been working on her exercise, but is no longer as active. Does not drink and does not smoke   Social Determinants of Radio broadcast assistant Strain: Not on file  Food Insecurity: Not on file  Transportation Needs: Not on file  Physical Activity: Not on file  Stress: Not on file  Social Connections: Not on file  Intimate Partner Violence: Not on file   Family History  Problem Relation Age of Onset   Coronary artery disease Mother    Hypertension Mother    Heart disease Father    Atrial fibrillation Son    Lung cancer Sister    Heart disease Brother    Heart disease Brother    Healthy Son    Thyroid disease Daughter    Healthy Daughter    Healthy Daughter    FH: her son died 08/06/17 massive heart attack   BP 124/74   Pulse (!) 104   Wt 84.8 kg (187 lb)   SpO2 97%   BMI 34.20 kg/m   Wt Readings from Last 3 Encounters:  11/12/21 84.8 kg (187 lb)  11/03/21 84.8 kg (187 lb)  10/22/21 79.4 kg (175 lb)   PHYSICAL EXAM: General:  NAD. No resp difficulty HEENT: Normal Neck: Supple. JVP 8-9. Carotids 2+ bilat; no bruits. No lymphadenopathy or thryomegaly appreciated. Cor: PMI nondisplaced. Irregular rate & rhythm. No rubs, gallops, 2/6 HSM apex Lungs: Clear Abdomen: Soft, nontender, + distended. No hepatosplenomegaly. No bruits or masses. Good bowel sounds. Extremities: No cyanosis, clubbing, rash, trace BLE edema Neuro: Alert & oriented x 3, cranial nerves grossly intact. Moves all 4 extremities w/o difficulty. Affect pleasant.  ASSESSMENT & PLAN: 1. Acute on chronic systolic CHF: Mixed ischemic/nonischemic cardiomyopathy. Echo 1/23 with EF 25-30%, moderate RV dysfunction, moderate biatrial enlargement, at least moderate posteriorly-directed MR (may be  worse).  Most recent prior echo showed EF 50-55% in 9/20, EF was 20-25% on 6/16 echo.  It is not clear why EF has dropped since 9/20 (cath does not explain).  Atrial fibrillation has been chronic x years. Frequent PVCs are noted.  RHC showed elevated R>L heart filling pressures with pulmonary arterial hypertension and low CI 1.89. Cardiac MRI w/ myocardial LGE in septum is in a noncoronary pattern, ? myocarditis, anterior wall LGE is subendocardial and may be due to prior MI => ?mixed ischemic/nonischemic cardiomyopathy. Repeat 5/23 showed EF 30-35%, mildly dilated RV with mild RV systolic dysfunction, PASP 67 mmHg, moderate-severe functional MR with posterior leaflet restriction and dilated IVC. TEE (6/23) with EF 30-35%, moderate RV dysfunction, severe MR.  NYHA III, volume overloaded on exam, REDs 47%.  This is complicated by cardiorenal syndrome.  - Increase torsemide to 40 mg bid and take metolazone 2.5 mg + extra 40 KCL today. BMET/BNP today, repeat BMET at follow up in 10-14 days. - Continue spironolactone 25 mg daily.  - Continue Farxiga 10 mg daily.  - Continue Imdur 60 mg daily and hydralazine 12.5 mg tid. - She will stay off losartan with elevated creatinine.   - Off Toprol with bradycardia. - Off digoxin with bradycardia. 2. Atrial fibrillation: This appears permanent, x years.   - Continue Eliquis 5 mg bid. No bleeding issues.  3. PVCs: Frequent during 2/23 admission but was also on milrinone. ? Contributing to cardiomyopathy.   - Now off amiodarone. 4. Mitral regurgitation: TEE 2/23 showed restriction of the posterior mitral leaflet and moderate eccentric mitral regurgitation (posteriorly-directed). Normal LV size w/ global HK EF  35-40%, RV size normal w/ moderate systolic dysfunction, severe LAE. Suspect functional MR, possibly atrial functional MR.  Cardiac MRI with moderate MR. Echo 5/23 showed EF 30-35%, mildly dilated RV with mild RV systolic dysfunction, PASP 67 mmHg, moderate-severe  functional MR with posterior leaflet restriction and dilated IVC. - TEE (6/23) with severe MR with restricted posterior leaflet in setting of dilation of the LA and LV.  - Referred to Structural Heart team for MitraClip evaluation. She has an appt next week. 5. CKD stage 3: Creatinine has trended higher. BMET today. 6. Pulmonary hypertension: On 2/23 RHC, PCWP not obtained, so with using LVEDP, PVR is 11 WU suggesting severe pulmonary arterial hypertension.  Etiology unclear.  Sleep study negative.  V/Q scan not suggestive of chronic PE. CT chest did not show interstitial lung disease. Serologic workup negative (ANA, anti-centromere ab, anti-scl70, RF) - Repeat RHC (6/23) showed PCWP 27. 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 7. CAD: Cath 2/23 with prox RCA to Mid RCA lesion 60% stenosis, distal LCx lesion 90% stenosis (similar to the past).  Medical management. Not clear that CAD can explain her cardiomyopathy. No further CP. - Continue statin.  - No ASA given Eliquis use.    Follow up in 2 weeks with APP for fluid check (ReDs and BMET) and 3 months with Dr. Aundra Dubin.  Maricela Bo [facility] FNP-BC 11/12/2021

## 2021-11-17 ENCOUNTER — Other Ambulatory Visit: Payer: Self-pay | Admitting: Physician Assistant

## 2021-11-17 NOTE — Telephone Encounter (Signed)
Is this ok to refill?  

## 2021-11-20 ENCOUNTER — Ambulatory Visit (INDEPENDENT_AMBULATORY_CARE_PROVIDER_SITE_OTHER): Payer: Medicare Other | Admitting: Internal Medicine

## 2021-11-20 ENCOUNTER — Encounter: Payer: Self-pay | Admitting: Internal Medicine

## 2021-11-20 VITALS — BP 138/76 | HR 60 | Ht 63.0 in | Wt 178.8 lb

## 2021-11-20 DIAGNOSIS — I5042 Chronic combined systolic (congestive) and diastolic (congestive) heart failure: Secondary | ICD-10-CM | POA: Diagnosis not present

## 2021-11-20 DIAGNOSIS — I340 Nonrheumatic mitral (valve) insufficiency: Secondary | ICD-10-CM

## 2021-11-20 DIAGNOSIS — I272 Pulmonary hypertension, unspecified: Secondary | ICD-10-CM | POA: Diagnosis not present

## 2021-11-20 DIAGNOSIS — I4821 Permanent atrial fibrillation: Secondary | ICD-10-CM

## 2021-11-20 NOTE — Patient Instructions (Signed)
You will hear back from the Structural Heart Team.  Have a great day!

## 2021-11-20 NOTE — Progress Notes (Addendum)
Pre Surgical Assessment: 5 M Walk Test  23M=16.48f  5 Meter Walk Test- trial 1: 9.91 seconds 5 Meter Walk Test- trial 2: 9.50 seconds 5 Meter Walk Test- trial 3: 8.50 seconds 5 Meter Walk Test Average: 9.30 seconds  STS risk calculation: Isolated MVR   Risk of Mortality: 9.999% Renal Failure: 34.960% Permanent Stroke: 8.005% Prolonged Ventilation: 41.421% DSW Infection: 0.206% Reoperation: 7.248% Morbidity or Mortality: 52.766% Short Length of Stay: 6.036% Long Length of Stay: 37.782%  MV Repair  Risk of Mortality: 5.460% Renal Failure: 22.888% Permanent Stroke: 6.200% Prolonged Ventilation: 28.101% DSW Infection: 0.107% Reoperation: 5.593% Morbidity or Mortality: 42.132% Short Length of Stay: 7.992% Long Length of Stay: 31.119%

## 2021-11-20 NOTE — Progress Notes (Signed)
Patient ID: Stephanie Franco MRN: [id number] DOB/AGE: [birth year] 71 y.o.  Primary Care Physician:Williams, Mare Ferrari, PA-C Primary Cardiologist: Glenetta Hew, MD HF Cardiologist: Loralie Champagne, MD   FOCUSED CARDIOVASCULAR PROBLEM LIST:    1.  Permanent atrial fibrillation on apixaban 2.  Coronary artery disease status post PCI of mid LAD remotely; last cardiac catheterization February 2023 demonstrated a 60% proximal right coronary artery lesion and 90% distal left circumflex lesion; right heart catheterization June 2023 with a mean wedge pressure of 27 mmHg with V waves to 32 mmHg with a cardiac output of 4.3 L/min and an index of 2.3 L/min/m with a normal PA pulsatility index: PVR 6.5 Woods units  3.  Nonischemic cardiomyopathy with an ejection fraction of 30-35% with NYHA 2-3 symptoms 4.  Severe mitral regurgitation Carpentier class IIIB 5.  Chronic kidney disease stage III 6.  Hypertension 7.  Hyperlipidemia 8.  Left breast cancer status post mastectomy/XRT now metastatic to the bone 9.  Type 2 diabetes on insulin 10.  BMI of 33 kg/m   HISTORY OF PRESENT ILLNESS: The patient is a 71 y.o. female with the indicated medical history here for recommendations regarding her significant mitral regurgitation.  The patient has been cared for by the heart failure team for some time.  The patient is here with her husband today.  She tells me since COVID she has had progressive shortness of breath.  She tells me that she has rarely decreased her activity level due to fears of shortness of breath.  She is never short of breath at rest.  Her breathing seems to be better now that her diuretics are being actively managed.  She denies any daily peripheral edema but on occasion she does develop edema which seems to resolve with as needed metolazone.  She endorses some paroxysmal nocturnal dyspnea and orthopnea at times.  Taking a bath makes her reliably short of breath.  She tells me if she felt  better she would like to walk and exercise.  Prior to [place] when she last felt well she was able to do these things without any limitations.  She does not smoke and sees a dentist on a regular basis.  She was recently admitted due to hypokalemia and acute kidney injury.  She is found to have developed metastases of her breast cancer.  She was started on Femara with plans to start Zometa as well.    Family History  Problem Relation Age of Onset   Coronary artery disease Mother    Hypertension Mother    Heart disease Father    Atrial fibrillation Son    Lung cancer Sister    Heart disease Brother    Heart disease Brother    Healthy Son    Thyroid disease Daughter    Healthy Daughter    Healthy Daughter     Social History   Socioeconomic History   Marital status: Married    Spouse name: Not on file   Number of children: 4   Years of education: Not on file   Highest education level: Not on file  Occupational History   Not on file  Tobacco Use   Smoking status: Former    Packs/day: 0.50    Years: 6.00    Total pack years: 3.00    Types: Cigarettes    Quit date: 05/18/1992    Years since quitting: 29.5   Smokeless tobacco: Never   Tobacco comments:    Former smoker 08/15/21  Vaping Use   Vaping Use: Never used  Substance and Sexual Activity   Alcohol use: No    Comment: 07/13/11 "have drank occasionally; not now"   Drug use: No   Sexual activity: Not Currently    Birth control/protection: Surgical  Other Topics Concern   Not on file  Social History Narrative   She is a married mother of 38, grandmother 2. Usually accompanied by her husband. She does not work. She had been working on her exercise, but is no longer as active. Does not drink and does not smoke   Social Determinants of Radio broadcast assistant Strain: Not on file  Food Insecurity: Not on file  Transportation Needs: Not on file  Physical Activity: Not on file  Stress: Not on file  Social Connections:  Not on file  Intimate Partner Violence: Not on file     Prior to Admission medications   Medication Sig Start Date End Date Taking? Authorizing Provider  acetaminophen (TYLENOL) 500 MG tablet Take 1,000 mg by mouth every 6 (six) hours as needed for mild pain.    [provider]  albuterol (PROVENTIL HFA;VENTOLIN HFA) 108 (90 Base) MCG/ACT inhaler Inhale 1-2 puffs into the lungs every 6 (six) hours as needed for wheezing or shortness of breath. 05/20/15   Harvel Quale, MD  albuterol (PROVENTIL) (2.5 MG/3ML) 0.083% nebulizer solution Take 3 mLs (2.5 mg total) by nebulization every 6 (six) hours as needed for wheezing or shortness of breath. 05/20/15   Harvel Quale, MD  allopurinol (ZYLOPRIM) 300 MG tablet TAKE 1 TABLET BY MOUTH EVERY DAY AS NEEDED 11/17/21   Irene Pap, PA-C  Biotin 1000 MCG tablet Take 1,000 mcg by mouth daily.    [provider]  dapagliflozin propanediol (FARXIGA) 10 MG TABS tablet Take 1 tablet (10 mg total) by mouth daily. 06/28/21   Lyda Jester M, PA-C  ELIQUIS 5 MG TABS tablet TAKE 1 TABLET BY MOUTH TWICE A DAY 07/11/19   Leonie Man, MD  ezetimibe (ZETIA) 10 MG tablet Take 1 tablet (10 mg total) by mouth daily. 06/27/21   Lyda Jester M, PA-C  fexofenadine (ALLEGRA) 180 MG tablet Take 180 mg by mouth daily as needed for allergies.  04/30/15   [provider]  fluticasone (FLONASE) 50 MCG/ACT nasal spray Place 2 sprays into the nose daily as needed for allergies or rhinitis.     [provider]  gabapentin (NEURONTIN) 600 MG tablet Take 300 mg by mouth at bedtime.    [provider]  glucose blood test strip 1 each by Other route as needed for other. Use as instructed    [provider]  hydrALAZINE (APRESOLINE) 25 MG tablet Take 0.5 tablets (12.5 mg total) by mouth 3 (three) times daily. 10/07/21   Larey Dresser, MD  Hydrocortisone, Perianal, 1 % CREA Apply 1 application. topically 3 (three)  times daily as needed (irritation). 09/18/21   [provider]  isosorbide mononitrate (IMDUR) 60 MG 24 hr tablet Take 60 mg by mouth daily.    [provider]  letrozole (FEMARA) 2.5 MG tablet Take 1 tablet (2.5 mg total) by mouth daily. 10/02/21   Owens Shark, NP  LORazepam (ATIVAN) 1 MG tablet Take 1 mg by mouth 2 (two) times daily as needed for anxiety. 07/07/16   [provider]  losartan (COZAAR) 25 MG tablet Take 12.5 mg by mouth daily. Patient not taking: Reported on 11/12/2021 10/12/21  [provider]  metolazone (ZAROXOLYN) 2.5 MG tablet Take 1 tablet (2.5 mg total) by mouth once for 1 dose. 11/12/21 11/12/21  Rafael Bihari, FNP  Multiple Vitamins-Minerals (MULTIVITAMIN WITH MINERALS) tablet Take 1 tablet by mouth daily.    [provider]  Naphazoline-Glycerin (CLEAR EYES MAX REDNESS RELIEF OP) Place 2 drops into both eyes daily as needed (redness).    [provider]  nitroGLYCERIN (NITROSTAT) 0.4 MG SL tablet DISSOLVE 1 TABLET UNDER THE TONGUE EVERY 5 MINUTES AS NEEDED FOR CHEST PAIN. 07/25/21   Leonie Man, MD  nystatin (MYCOSTATIN/NYSTOP) powder nystatin 100,000 unit/gram topical powder  APPLY 1 GRAM TO THE AFFECTED AREA(S) BY TOPICAL ROUTE 2 TIMES PER DAY.    [provider]  ondansetron (ZOFRAN) 4 MG tablet Take 1 tablet (4 mg total) by mouth every 6 (six) hours as needed for nausea. 09/26/21   Lajean Manes, MD  OVER THE COUNTER MEDICATION Apply 1 application. topically daily as needed (pain). Apply to legs and feet    [provider]  Oxycodone HCl 10 MG TABS Take 5 mg by mouth 3 (three) times daily as needed for pain. 07/26/21   [provider]  pantoprazole (PROTONIX) 40 MG tablet TAKE 1 TABLET BY MOUTH EVERY DAY 04/02/21   Leonie Man, MD  polyethylene glycol powder (GLYCOLAX/MIRALAX) 17 GM/SCOOP powder Take 17 g by mouth daily. Patient taking differently: Take 17 g by mouth 2 (two) times  daily. As needed 09/26/21   Lajean Manes, MD  potassium chloride SA (KLOR-CON M) 20 MEQ tablet Take 2 tablets (40 mEq total) by mouth daily. 10/22/21 11/21/21  Larey Dresser, MD  rosuvastatin (CRESTOR) 40 MG tablet Take 40 mg by mouth daily.    [provider]  senna-docusate (SENOKOT-S) 8.6-50 MG tablet Take 1 tablet by mouth 2 (two) times daily. 09/26/21   Lajean Manes, MD  spironolactone (ALDACTONE) 25 MG tablet Take 1 tablet (25 mg total) by mouth daily. 09/26/21   Lajean Manes, MD  torsemide (DEMADEX) 20 MG tablet Take 2 tablets (40 mg total) by mouth 2 (two) times daily. 11/12/21   [place], Maricela Bo, FNP  TOUJEO SOLOSTAR 300 UNIT/ML Solostar Pen Inject 30 Units into the skin 2 (two) times daily. 07/28/21   [provider]  triamcinolone ointment (KENALOG) 0.5 % Apply to legs 2 times daily for 2 weeks Patient taking differently: Apply 1 application  topically 2 (two) times daily as needed (Irritation). 05/02/18   Leonie Man, MD    No Known Allergies  REVIEW OF SYSTEMS:  General: no fevers/chills/night sweats Eyes: no blurry vision, diplopia, or amaurosis ENT: no sore throat or hearing loss Resp: no cough, wheezing, or hemoptysis CV: no edema or palpitations GI: no abdominal pain, nausea, vomiting, diarrhea, or constipation GU: no dysuria, frequency, or hematuria Skin: no rash Neuro: no headache, numbness, tingling, or weakness of extremities Musculoskeletal: no joint pain or swelling Heme: no bleeding, DVT, or easy bruising Endo: no polydipsia or polyuria  BP 138/76   Pulse 60   Ht '5\' 3"'$  (1.6 m)   Wt 178 lb 12.8 oz (81.1 kg)   SpO2 95%   BMI 31.67 kg/m   PHYSICAL EXAM: GEN:  AO x 3 in no acute distress HEENT: normal Dentition: Normal Neck: CV waves seen. +2 carotid upstrokes without bruits. No thyromegaly. Lungs: equal expansion, clear bilaterally CV: Apex is discrete and nondisplaced, irregular rate and rhythm with 3/6 holosystolic murmur Abd: soft,  non-tender, non-distended;  no bruit; positive bowel sounds Ext: no edema, ecchymoses, or cyanosis Vascular: 2+ femoral pulses, 2+ radial pulses       Skin: warm and dry without rash Neuro: CN II-XII grossly intact; motor and sensory grossly intact    DATA AND STUDIES:  EKG: June 2023 atrial fibrillation without bundle-branch blocks  2D ECHO: May 2023 ejection fraction of 30 to 35% with global hypokinesis and moderate to severe eccentric mitral vegetation with mild tricuspid regurgitation with relatively preserved RV function  TEE: June 2023 demonstrates an ejection fraction of 30 to 35% with global hypokinesis.  The posterior leaflet is restricted in severe eccentric mitral regurgitation is seen.  The posterior leaflet measures 2 around 1 cm and the mitral valve area is around 4.79 cm with a mean gradient 2 mmHg.  Moderate to severe TR is noted  CARDIAC CATH: February 2023 demonstrates moderate obstructive coronary artery disease as detailed above; right heart catheterization June 2023 with preserved cardiac output and index with elevated filling pressures and pulmonary arterial hypertension with pulmonary vascular resistance of 6 Woods units  STS RISK CALCULATOR: Pending  NHYA CLASS: 2/3    ASSESSMENT AND PLAN:   Nonrheumatic mitral valve regurgitation  Chronic combined systolic and diastolic CHF, NYHA class 2 and ACC/AHA stage C (HCC)  Permanent atrial fibrillation: CHA2DS2-VASc Score 6  Pulmonary hypertension ([place])  The patient has developed severe symptomatic mitral regurgitation due to a restricted posterior leaflet from her cardiomyopathy.  She has also developed metastatic breast cancer.  She is on a good regimen of heart failure medications.  She does not have a left bundle branch block and therefore would not benefit from cardiac resynchronization therapy.  She looks to have functional mitral regurgitation due to her cardiomyopathy and restricted posterior leaflet.  This  looks to be anatomy that can be treated with mitral transcatheter edge-to-edge repair.  She has good dental health.  I will reach out to her oncology team regarding her long-term prognosis regarding metastatic breast cancer.  If she has a life expectancy of greater than 1 year then I think it makes sense to proceed with mitral transcatheter edge-to-edge repair.  She and her husband are interested in definitive treatment of her mitral regurgitation. She tells me that her oncology team did tell her that she had relatively good life expectancy of greater than 1 year.  We will coordinate with the patient and her family regarding scheduling of the procedure.  I have personally reviewed the patients imaging data as summarized above.  I have reviewed the natural history of aortic stenosis with the patient and family members who are present today. We have discussed the limitations of medical therapy and the poor prognosis associated with symptomatic mitral regurgitation. We have also reviewed potential treatment options, including palliative medical therapy, conventional mitral surgery, and transcatheter mitral edge-to-edge repair. We discussed treatment options in the context of this patient's specific comorbid medical conditions.   All of the patient's questions were answered today. Will make further recommendations based on the results of studies outlined above.   Total time spent with patient today 60 minutes. This includes reviewing records, evaluating the patient and coordinating care.   Early Osmond, MD  11/20/2021 10:15 AM    [facility] [address] [place], [place], [place]  [address] Phone: [phone]; Fax: [phone]

## 2021-11-25 ENCOUNTER — Other Ambulatory Visit: Payer: Self-pay | Admitting: Oncology

## 2021-11-25 ENCOUNTER — Telehealth: Payer: Self-pay

## 2021-11-25 ENCOUNTER — Other Ambulatory Visit: Payer: Self-pay

## 2021-11-25 DIAGNOSIS — I340 Nonrheumatic mitral (valve) insufficiency: Secondary | ICD-10-CM

## 2021-11-25 DIAGNOSIS — C50919 Malignant neoplasm of unspecified site of unspecified female breast: Secondary | ICD-10-CM | POA: Insufficient documentation

## 2021-11-25 LAB — SURGICAL PATHOLOGY

## 2021-11-25 NOTE — Progress Notes (Addendum)
11/12/21 Scr 1.78, CrCl 37 Pt on Letrozole. Has bone mets. MD would like Zometa '3mg'$  q 3 months due to renal function.  Raul Del Franklin, [place], BCPS, BCOP 11/25/2021 3:47 PM

## 2021-11-25 NOTE — Telephone Encounter (Signed)
Discussed the patient in Valve Team meeting today and it was agreed her anatomy is suitable for MitraClip and that she should be scheduled for procedure if she wishes.  Per Dr. Benay Spice, "From: Ladell Pier, MD  Sent: 11/22/2021   4:17 PM EDT  To: Early Osmond, MD  Subject: RE: Oncology prognosis                         She appears to only have metastatic disease involving the bones.  Her prognosis should be greater than a year  Thanks"   The patient wishes to proceed with MitraClip on 12/17/2021. Scheduled her for PAT visit 01/05/2022. She and her husband are grateful for call and agree with plan.

## 2021-11-26 ENCOUNTER — Inpatient Hospital Stay: Payer: Medicare Other | Attending: Oncology

## 2021-11-26 ENCOUNTER — Inpatient Hospital Stay: Payer: Medicare Other

## 2021-11-26 ENCOUNTER — Inpatient Hospital Stay (HOSPITAL_BASED_OUTPATIENT_CLINIC_OR_DEPARTMENT_OTHER): Payer: Medicare Other | Admitting: Nurse Practitioner

## 2021-11-26 ENCOUNTER — Encounter: Payer: Self-pay | Admitting: Nurse Practitioner

## 2021-11-26 ENCOUNTER — Encounter: Payer: Self-pay | Admitting: *Deleted

## 2021-11-26 VITALS — BP 130/89 | HR 62 | Temp 97.9°F | Resp 18 | Ht 63.0 in | Wt 177.6 lb

## 2021-11-26 DIAGNOSIS — N1831 Chronic kidney disease, stage 3a: Secondary | ICD-10-CM | POA: Insufficient documentation

## 2021-11-26 DIAGNOSIS — D259 Leiomyoma of uterus, unspecified: Secondary | ICD-10-CM | POA: Diagnosis not present

## 2021-11-26 DIAGNOSIS — E785 Hyperlipidemia, unspecified: Secondary | ICD-10-CM | POA: Diagnosis not present

## 2021-11-26 DIAGNOSIS — C50919 Malignant neoplasm of unspecified site of unspecified female breast: Secondary | ICD-10-CM

## 2021-11-26 DIAGNOSIS — I130 Hypertensive heart and chronic kidney disease with heart failure and stage 1 through stage 4 chronic kidney disease, or unspecified chronic kidney disease: Secondary | ICD-10-CM | POA: Diagnosis not present

## 2021-11-26 DIAGNOSIS — R61 Generalized hyperhidrosis: Secondary | ICD-10-CM | POA: Insufficient documentation

## 2021-11-26 DIAGNOSIS — R946 Abnormal results of thyroid function studies: Secondary | ICD-10-CM | POA: Diagnosis not present

## 2021-11-26 DIAGNOSIS — Z79899 Other long term (current) drug therapy: Secondary | ICD-10-CM | POA: Insufficient documentation

## 2021-11-26 DIAGNOSIS — Z9012 Acquired absence of left breast and nipple: Secondary | ICD-10-CM | POA: Diagnosis not present

## 2021-11-26 DIAGNOSIS — C50912 Malignant neoplasm of unspecified site of left female breast: Secondary | ICD-10-CM | POA: Diagnosis present

## 2021-11-26 DIAGNOSIS — C7951 Secondary malignant neoplasm of bone: Secondary | ICD-10-CM | POA: Insufficient documentation

## 2021-11-26 DIAGNOSIS — I5022 Chronic systolic (congestive) heart failure: Secondary | ICD-10-CM | POA: Diagnosis not present

## 2021-11-26 DIAGNOSIS — M549 Dorsalgia, unspecified: Secondary | ICD-10-CM | POA: Diagnosis not present

## 2021-11-26 DIAGNOSIS — R7401 Elevation of levels of liver transaminase levels: Secondary | ICD-10-CM | POA: Insufficient documentation

## 2021-11-26 DIAGNOSIS — I4821 Permanent atrial fibrillation: Secondary | ICD-10-CM | POA: Insufficient documentation

## 2021-11-26 DIAGNOSIS — I2510 Atherosclerotic heart disease of native coronary artery without angina pectoris: Secondary | ICD-10-CM | POA: Diagnosis not present

## 2021-11-26 DIAGNOSIS — D649 Anemia, unspecified: Secondary | ICD-10-CM | POA: Insufficient documentation

## 2021-11-26 DIAGNOSIS — E1122 Type 2 diabetes mellitus with diabetic chronic kidney disease: Secondary | ICD-10-CM | POA: Insufficient documentation

## 2021-11-26 LAB — CMP (CANCER CENTER ONLY)
ALT: 21 U/L (ref 0–44)
AST: 28 U/L (ref 15–41)
Albumin: 4.3 g/dL (ref 3.5–5.0)
Alkaline Phosphatase: 90 U/L (ref 38–126)
Anion gap: 13 (ref 5–15)
BUN: 51 mg/dL — ABNORMAL HIGH (ref 8–23)
CO2: 33 mmol/L — ABNORMAL HIGH (ref 22–32)
Calcium: 10.9 mg/dL — ABNORMAL HIGH (ref 8.9–10.3)
Chloride: 91 mmol/L — ABNORMAL LOW (ref 98–111)
Creatinine: 1.94 mg/dL — ABNORMAL HIGH (ref 0.44–1.00)
GFR, Estimated: 27 mL/min — ABNORMAL LOW (ref >60)
Glucose, Bld: 139 mg/dL — ABNORMAL HIGH (ref 70–99)
Potassium: 3.3 mmol/L — ABNORMAL LOW (ref 3.5–5.1)
Sodium: 137 mmol/L (ref 135–145)
Total Bilirubin: 0.8 mg/dL (ref 0.3–1.2)
Total Protein: 7.6 g/dL (ref 6.5–8.1)

## 2021-11-26 LAB — CBC WITH DIFFERENTIAL (CANCER CENTER ONLY)
Abs Immature Granulocytes: 0.01 10*3/uL (ref 0.00–0.07)
Basophils Absolute: 0 10*3/uL (ref 0.0–0.1)
Basophils Relative: 0 %
Eosinophils Absolute: 0.4 10*3/uL (ref 0.0–0.5)
Eosinophils Relative: 6 %
HCT: 33.6 % — ABNORMAL LOW (ref 36.0–46.0)
Hemoglobin: 10.2 g/dL — ABNORMAL LOW (ref 12.0–15.0)
Immature Granulocytes: 0 %
Lymphocytes Relative: 23 %
Lymphs Abs: 1.7 10*3/uL (ref 0.7–4.0)
MCH: 26.8 pg (ref 26.0–34.0)
MCHC: 30.4 g/dL (ref 30.0–36.0)
MCV: 88.2 fL (ref 80.0–100.0)
Monocytes Absolute: 0.7 10*3/uL (ref 0.1–1.0)
Monocytes Relative: 9 %
Neutro Abs: 4.5 10*3/uL (ref 1.7–7.7)
Neutrophils Relative %: 62 %
Platelet Count: 202 10*3/uL (ref 150–400)
RBC: 3.81 MIL/uL — ABNORMAL LOW (ref 3.87–5.11)
RDW: 18.3 % — ABNORMAL HIGH (ref 11.5–15.5)
WBC Count: 7.4 10*3/uL (ref 4.0–10.5)
nRBC: 0 % (ref 0.0–0.2)

## 2021-11-26 LAB — ECHO TEE
MV M vel: 4.31 m/s
MV Peak grad: 74.3 mmHg
Radius: 0.9 cm

## 2021-11-26 MED ORDER — SODIUM CHLORIDE 0.9 % IV SOLN: Freq: Once | INTRAVENOUS | Status: AC

## 2021-11-26 MED ORDER — OXYCODONE HCL 10 MG PO TABS: 5.0000 mg | ORAL_TABLET | Freq: Three times a day (TID) | ORAL | 0 refills | Status: DC | PRN

## 2021-11-26 MED ORDER — ZOLEDRONIC ACID 4 MG/5ML IV CONC
3.0000 mg | Freq: Once | INTRAVENOUS | Status: AC
  Administered 2021-11-26: 3 mg via INTRAVENOUS
  Filled 2021-11-26: qty 3.75

## 2021-11-26 NOTE — Patient Instructions (Signed)

## 2021-11-26 NOTE — Progress Notes (Signed)
Patient seen by Ned Card NP today  Vitals are within treatment parameters.  Labs reviewed by Ned Card NP  Serum creatinine 1.94 with GFR 27 K+ 3.3 and calcium elevated at 10.9  Per physician team, patient is ready for treatment and there are NO modifications to the treatment plan. Zometa dose is reduced to 3 mg per Dr. Benay Spice.

## 2021-11-26 NOTE — Progress Notes (Signed)
[facility] OFFICE PROGRESS NOTE   Diagnosis: Breast cancer  INTERVAL HISTORY:   Ms. Mittelstadt returns as scheduled.  She started letrozole following the last office visit.  She has occasional night sweats.  She continues to have back pain.  She estimates taking oxycodone 2-3 times a day and request we take over on refills.  She has left hip discomfort when laying in bed.  No mouth, tooth, jaw pain.  Objective:  Vital signs in last 24 hours:  Blood pressure 130/89, pulse 62, temperature 97.9 F (36.6 C), temperature source Oral, resp. rate 18, height 5' 3" (1.6 m), weight 177 lb 9.6 oz (80.6 kg), SpO2 98 %.    HEENT: No thrush or ulcers. Resp: Lungs clear bilaterally. Cardio: Irregular. GI: No hepatomegaly. Vascular: No leg edema.   Lab Results:  Lab Results  Component Value Date   WBC 7.4 11/26/2021   HGB 10.2 (L) 11/26/2021   HCT 33.6 (L) 11/26/2021   MCV 88.2 11/26/2021   PLT 202 11/26/2021   NEUTROABS 4.5 11/26/2021    Imaging:  No results found.  Medications: I have reviewed the patient's current medications.  Assessment/Plan: 1.  Bone lesions of the cervical spine -MRI brain with and without contrast 09/21/2021-multiple metastatic bone lesions, largest at the lateral sphenoid wing on the left -MRI of the cervical spine with and without contrast 09/21/2021-metastatic disease throughout the cervical region most prominent at C3, C6, C7, and T1.  T3 also involved but not well evaluated. -CT chest/abdomen/pelvis without contrast 09/22/2021-diffuse lytic and sclerotic lesions in the bones compatible with metastatic disease, mild compression deformity in the inferior endplate of O70 which is indeterminate in age, indeterminate hypodensity in the posterior right lobe of the liver measuring 1.2 cm. -Bone scan 09/23/2021-numerous areas of abnormal uptake including the distal femurs 2.  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  Fibrosarcoma of the left breast -Status post left breast mastectomy, positive surgical margins 4.  Normocytic anemia 5.  Acute on chronic kidney disease 6.  Hypercalcemia 7.  Transaminitis 8.  Elevated TSH and free T4 9.  HFrEF 35% diagnosed February 2023 10.  Permanent atrial fibrillation 11.  Hypertension 12.  CAD with prior PCI 13.  Diabetes mellitus 14.  Hyperlipidemia 15.  Uterine fibroids 16.  Hypercalcemia 11/26/2021  Disposition: Ms. Brindle appears unchanged.  She began letrozole following her last office visit.  Overall seems to be tolerating well.  Plan to continue the same.  She has been cleared by her dentist to receive Zometa.  Plan to proceed with the first Zometa infusion today.  Potential side effects reviewed including flulike symptoms, osteonecrosis of the jaw.  She agrees to proceed.  CBC and chemistry panel reviewed.  Labs adequate to proceed with Zometa.  Zometa dose reduced to 3 mg based on renal function.  She has hypercalcemia today.  Plan for repeat basic metabolic panel in 2 weeks.  New oxycodone prescription sent to her pharmacy.  She will return for lab and follow-up in 6 weeks.  We are available to see her sooner if needed.  Patient seen with Dr. Benay Spice.    Ned Card ANP/GNP-BC   11/26/2021  10:09 AM This was a shared visit with Ned Card.  Ms. Mangano reports improvement in bone pain since starting letrozole.  She will continue letrozole.   She will receive Zometa today for FLOX against fracture and treatment of hypercalcemia.  The plan is to schedule a restaging bone scan 4-6 months from starting letrozole.  I was present for greater than 50% of today's visit.  I performed medical decision making.  Julieanne Manson, MD

## 2021-12-01 ENCOUNTER — Ambulatory Visit (HOSPITAL_COMMUNITY)
Admission: RE | Admit: 2021-12-01 | Discharge: 2021-12-01 | Disposition: A | Discharge status: Home / Self Care | Payer: Medicare Other | Source: Ambulatory Visit | Attending: Family Medicine | Admitting: Family Medicine

## 2021-12-01 ENCOUNTER — Encounter (HOSPITAL_COMMUNITY): Payer: Self-pay

## 2021-12-01 VITALS — BP 144/86 | HR 118 | Ht 63.0 in | Wt 180.4 lb

## 2021-12-01 DIAGNOSIS — E1122 Type 2 diabetes mellitus with diabetic chronic kidney disease: Secondary | ICD-10-CM | POA: Insufficient documentation

## 2021-12-01 DIAGNOSIS — Z7984 Long term (current) use of oral hypoglycemic drugs: Secondary | ICD-10-CM | POA: Insufficient documentation

## 2021-12-01 DIAGNOSIS — I340 Nonrheumatic mitral (valve) insufficiency: Secondary | ICD-10-CM

## 2021-12-01 DIAGNOSIS — I2721 Secondary pulmonary arterial hypertension: Secondary | ICD-10-CM | POA: Insufficient documentation

## 2021-12-01 DIAGNOSIS — N1831 Chronic kidney disease, stage 3a: Secondary | ICD-10-CM

## 2021-12-01 DIAGNOSIS — I252 Old myocardial infarction: Secondary | ICD-10-CM | POA: Diagnosis not present

## 2021-12-01 DIAGNOSIS — Z79899 Other long term (current) drug therapy: Secondary | ICD-10-CM | POA: Diagnosis not present

## 2021-12-01 DIAGNOSIS — I493 Ventricular premature depolarization: Secondary | ICD-10-CM | POA: Insufficient documentation

## 2021-12-01 DIAGNOSIS — N183 Chronic kidney disease, stage 3 unspecified: Secondary | ICD-10-CM | POA: Diagnosis not present

## 2021-12-01 DIAGNOSIS — I428 Other cardiomyopathies: Secondary | ICD-10-CM | POA: Insufficient documentation

## 2021-12-01 DIAGNOSIS — Z7901 Long term (current) use of anticoagulants: Secondary | ICD-10-CM | POA: Diagnosis not present

## 2021-12-01 DIAGNOSIS — I4821 Permanent atrial fibrillation: Secondary | ICD-10-CM | POA: Insufficient documentation

## 2021-12-01 DIAGNOSIS — R7989 Other specified abnormal findings of blood chemistry: Secondary | ICD-10-CM | POA: Insufficient documentation

## 2021-12-01 DIAGNOSIS — R0600 Dyspnea, unspecified: Secondary | ICD-10-CM | POA: Insufficient documentation

## 2021-12-01 DIAGNOSIS — I130 Hypertensive heart and chronic kidney disease with heart failure and stage 1 through stage 4 chronic kidney disease, or unspecified chronic kidney disease: Secondary | ICD-10-CM | POA: Insufficient documentation

## 2021-12-01 DIAGNOSIS — Z9861 Coronary angioplasty status: Secondary | ICD-10-CM

## 2021-12-01 DIAGNOSIS — I2510 Atherosclerotic heart disease of native coronary artery without angina pectoris: Secondary | ICD-10-CM

## 2021-12-01 DIAGNOSIS — I272 Pulmonary hypertension, unspecified: Secondary | ICD-10-CM

## 2021-12-01 DIAGNOSIS — I5023 Acute on chronic systolic (congestive) heart failure: Secondary | ICD-10-CM | POA: Diagnosis not present

## 2021-12-01 LAB — BASIC METABOLIC PANEL
Anion gap: 17 — ABNORMAL HIGH (ref 5–15)
BUN: 38 mg/dL — ABNORMAL HIGH (ref 8–23)
CO2: 23 mmol/L (ref 22–32)
Calcium: 8.7 mg/dL — ABNORMAL LOW (ref 8.9–10.3)
Chloride: 98 mmol/L (ref 98–111)
Creatinine, Ser: 2.2 mg/dL — ABNORMAL HIGH (ref 0.44–1.00)
GFR, Estimated: 23 mL/min — ABNORMAL LOW (ref >60)
Glucose, Bld: 147 mg/dL — ABNORMAL HIGH (ref 70–99)
Potassium: 4 mmol/L (ref 3.5–5.1)
Sodium: 138 mmol/L (ref 135–145)

## 2021-12-01 MED ORDER — POTASSIUM CHLORIDE CRYS ER 20 MEQ PO TBCR
60.0000 meq | EXTENDED_RELEASE_TABLET | Freq: Every day | ORAL | 6 refills | Status: DC
Start: 2021-12-01 — End: 2021-12-16

## 2021-12-01 MED ORDER — TORSEMIDE 20 MG PO TABS: 80.0000 mg | ORAL_TABLET | Freq: Two times a day (BID) | ORAL | 6 refills | Status: DC

## 2021-12-01 NOTE — Progress Notes (Signed)
ADVANCED HF CLINIC NOTE   Primary Care: Irene Pap, PA-C Primary Cardiologist: Dr. Ellyn Hack HF Cardiologist: Dr. Aundra Dubin  HPI: Stephanie Franco is a 71 y.o. with history of permanent atrial fibrillation for 20 years, DMII, HTN, Hyperlipidemia, left breast cancer (had mastectomy 20 years ago), right hip replacement, arthritis,  CAD with prior PCI OM in 9470 and systolic heart failure.    Had sleep study 05/2021 - negative for sleep apnea.   She was admitted 06/20/21 with chest pain, but also noted to have orthopnea and worsening dyspnea. HS Trop 71>58>51. Echo showed EF 25-30%, moderate RV dysfunction, moderate biatrial enlargement, at least moderate posteriorly-directed MR. LHC/RHC showed stable CAD with elevated R>L heart filling pressures and low cardiac index 1.89.  AHF team consulted. She was started on milrinone, PICC placed. Started on IV lasix and added amiodarone given frequent PVCs. Underwent further w/u of pulmonary hypertension and HF. V/Q scan was not suggestive of chronic PE.  Serologic workup negative (ANA, anti-centromere ab, anti-scl70, RF). TEE was done to better assess severity of her MR. This showed restriction of the posterior mitral leaflet and moderate eccentric mitral regurgitation (posteriorly-directed), normal LV size w/ global HK EF 35-40%, RV size normal w/ moderate systolic dysfunction, severe LAE. Suspect functional MR, possibly atrial functional MR. 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  LVEF on MRI 36%, MR moderate. She was weaned off of milrinone w/ stable co-ox and transition to PO diuretics, torsemide 40 mg daily. GDMT added. SCr stabilized ~1.9. She was discharged home, weight 188 lbs.  Post hospital follow up 2/23, mildly volume up and instructed to take extra 20 mg of torsemide x 2 day and Imdur increased. SCr stable at 1.58  Seen in ED 3/23 with CP, SOB and elevated SCr.  Cards saw, felt she was on the dry side. Given gentle IVF, digoxin and amio stopped due to bradycardia/junctional rhythm, and hydralazine stopped due to fatigue.   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  Biopsy was done showing breast cancer primary. She has been started on letrozole.   Echo 5/23 showed EF 30-35%, mildly dilated RV with mild RV systolic dysfunction, PASP 67 mmHg, moderate-severe functional MR with posterior leaflet restriction and dilated IVC.  She has been off losartan, and torsemide has been decreased to 20 mg bid due to elevated creatinine (most recently 2.08).   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 TEE showed EF 30-35%, moderate RV systolic dysfunction, moderate TR, severe MR. Plan to assess for MitraClip.  RHC follow up 6/23, she was more short of breath and volume overloaded, REDs clip 47%. She was instructed to increase torsemide to 40 mg bid and take a dose of metolazone 2.5 mg + 40 KCL.  Today she returns for HF follow up with her husband. She had a bad night and did not sleep well. Overall feeling fair. She has dyspnea walking short distances on flat ground. Denies palpitations, abnormal bleeding, CP, dizziness, edema, or PND/Orthopnea. She sleeps reclined in an adjustable bed. Appetite ok. No fever or chills. Weight at home 180 pounds. Taking all medications.   ReDs: 47%--> 35% today.  ECG (personally reviewed):  none ordered today.  Labs (2/23): K 4.0, creatinine 1.97 Labs (3/23): K 3.9, creatinine 1.45 Labs (5/23): K 4.7, creatinine  2.08 Labs (6/23): K 4.1, creatinine 1.79  PMH: 1. Type 2 diabetes 2. Breast cancer: Mastectomy 2008.  Bony metastases found 5/23, biopsy showed breast primary.  3. CKD stage 3 4. Atrial fibrillation: Permanent.  5. CAD: DES to LCx in  2006.  - LHC (2/23): 60% proximal RCA, 90% distal LCx.  Medical management.  6. Gout 7. Hyperlipidemia 8. HTN 9. Chronic systolic CHF: Primarily nonischemic cardiomyopathy.  - Echo (2019): EF 40-45% RV moderately dilated.  - Echo (2020): EF 50-55%  LA and RA moderately dilated. - Echo (2/23): EF 25%,  RV moderately reduced systolic function, LA severely dilated, RA moderately dilated, MV restricted posterior leaflet  with moderate MR.  - cMRI (2/23): Findings suggest mixed ischemic/nonischemic cardiomyopathy (?myocarditis with prior coronary disease).  LVEF on MRI 36%. MR moderate.  - RHC (2/23): RA 21, PA 80/30, LVEDP 14, CI 1.9, PVR 11 WU. - Echo (5/23): EF 30-35%, mildly dilated RV with mild RV systolic dysfunction, PASP 67 mmHg, moderate-severe functional MR with posterior leaflet restriction and dilated IVC.  10. Mitral regurgitation: Suspect functional MR, possibly atrial functional MR.  - Moderate-severe MR with restricted posterior leaflet on 5/23 echo.  - TEE (6/23) showed severe MR with restricted posterior leaflet in setting of dilation of the LA and LV. 11. Pulmonary hypertension:  - RHC (2/23): RA 21, PA 80/30, LVEDP 14, CI 1.9, PVR 11 WU. - VQ scan (2/23): no evidence for chronic PE - Sleep study negative - Serologic workup negative (ANA, anti-centromere ab, anti-scl70, RF) - CT chest (5/23) did not show ILD.  - RHC (6/23): RA 16, PA 86/41, PCWP 27, CI 2.36, PVR 6.5 WU 12. PVCs   Current Outpatient Medications  Medication Sig Dispense Refill   acetaminophen (TYLENOL) 500 MG tablet Take 1,000 mg by mouth every 6 (six) hours as needed for mild pain.     albuterol (PROVENTIL HFA;VENTOLIN HFA) 108 (90 Base) MCG/ACT inhaler Inhale 1-2 puffs into the lungs every 6 (six) hours as needed for wheezing or shortness of breath. 1 Inhaler 0   albuterol (PROVENTIL) (2.5 MG/3ML) 0.083% nebulizer solution Take 3 mLs (2.5 mg total) by nebulization every 6 (six) hours as needed for wheezing  or shortness of breath. 75 mL 12   allopurinol (ZYLOPRIM) 300 MG tablet TAKE 1 TABLET BY MOUTH EVERY DAY AS NEEDED 90 tablet 1   Biotin 1000 MCG tablet Take 1,000 mcg by mouth daily.     dapagliflozin propanediol (FARXIGA) 10 MG TABS tablet Take 1 tablet (10 mg total) by mouth daily. 30 tablet 5   ELIQUIS 5 MG TABS tablet TAKE 1 TABLET BY MOUTH TWICE A DAY 180 tablet 2   ezetimibe (ZETIA) 10 MG tablet Take 1 tablet (10 mg total) by mouth daily. 30 tablet 5   fexofenadine (ALLEGRA) 180 MG tablet Take 180 mg by mouth daily as needed for allergies.   3   fluticasone (FLONASE) 50 MCG/ACT nasal spray Place 2 sprays into the nose daily as needed for allergies or rhinitis.      gabapentin (NEURONTIN) 600 MG tablet Take 300 mg by mouth at bedtime.     glucose blood test strip 1 each by Other route as needed for other. Use as instructed     hydrALAZINE (APRESOLINE) 25 MG tablet Take 0.5 tablets (12.5 mg total) by mouth 3 (three) times daily. 80 tablet 4   Hydrocortisone, Perianal, 1 % CREA Apply 1 application. topically 3 (three) times daily as needed (irritation).  isosorbide mononitrate (IMDUR) 60 MG 24 hr tablet Take 60 mg by mouth daily.     letrozole (FEMARA) 2.5 MG tablet Take 1 tablet (2.5 mg total) by mouth daily. 30 tablet 5   LORazepam (ATIVAN) 1 MG tablet Take 1 mg by mouth 2 (two) times daily as needed for anxiety.     Multiple Vitamins-Minerals (MULTIVITAMIN WITH MINERALS) tablet Take 1 tablet by mouth daily.     Naphazoline-Glycerin (CLEAR EYES MAX REDNESS RELIEF OP) Place 2 drops into both eyes daily as needed (redness).     nystatin (MYCOSTATIN/NYSTOP) powder nystatin 100,000 unit/gram topical powder  APPLY 1 GRAM TO THE AFFECTED AREA(S) BY TOPICAL ROUTE 2 TIMES PER DAY.     ondansetron (ZOFRAN) 4 MG tablet Take 1 tablet (4 mg total) by mouth every 6 (six) hours as needed for nausea. 20 tablet 0   OVER THE COUNTER MEDICATION Apply 1 application. topically daily as needed (pain). Apply  to legs and feet     Oxycodone HCl 10 MG TABS Take 0.5 tablets (5 mg total) by mouth 3 (three) times daily as needed. For pain 90 tablet 0   pantoprazole (PROTONIX) 40 MG tablet TAKE 1 TABLET BY MOUTH EVERY DAY 90 tablet 3   potassium chloride SA (KLOR-CON M) 20 MEQ tablet Take 2 tablets (40 mEq total) by mouth daily. (Patient taking differently: Take 40 mEq by mouth 2 (two) times daily.) 30 tablet 0   rosuvastatin (CRESTOR) 40 MG tablet Take 40 mg by mouth daily.     senna-docusate (SENOKOT-S) 8.6-50 MG tablet Take 1 tablet by mouth 2 (two) times daily. 60 tablet 0   spironolactone (ALDACTONE) 25 MG tablet Take 1 tablet (25 mg total) by mouth daily. 30 tablet 0   torsemide (DEMADEX) 20 MG tablet Take 2 tablets (40 mg total) by mouth 2 (two) times daily. 360 tablet 3   TOUJEO SOLOSTAR 300 UNIT/ML Solostar Pen Inject 30 Units into the skin 2 (two) times daily.     nitroGLYCERIN (NITROSTAT) 0.4 MG SL tablet DISSOLVE 1 TABLET UNDER THE TONGUE EVERY 5 MINUTES AS NEEDED FOR CHEST PAIN. (Patient not taking: Reported on 12/01/2021) 25 tablet 6   No current facility-administered medications for this encounter.   No Known Allergies  Social History   Socioeconomic History   Marital status: Married    Spouse name: Not on file   Number of children: 4   Years of education: Not on file   Highest education level: Not on file  Occupational History   Not on file  Tobacco Use   Smoking status: Former    Packs/day: 0.50    Years: 6.00    Total pack years: 3.00    Types: Cigarettes    Quit date: 05/18/1992    Years since quitting: 29.5   Smokeless tobacco: Never   Tobacco comments:    Former smoker 08/15/21  Vaping Use   Vaping Use: Never used  Substance and Sexual Activity   Alcohol use: No    Comment: 07/13/11 "have drank occasionally; not now"   Drug use: No   Sexual activity: Not Currently    Birth control/protection: Surgical  Other Topics Concern   Not on file  Social History Narrative    She is a married mother of 29, grandmother 2. Usually accompanied by her husband. She does not work. She had been working on her exercise, but is no longer as active. Does not drink and does not smoke   Social Determinants of Health  Financial Resource Strain: Not on file  Food Insecurity: Not on file  Transportation Needs: Not on file  Physical Activity: Not on file  Stress: Not on file  Social Connections: Not on file  Intimate Partner Violence: Not on file   Family History  Problem Relation Age of Onset   Coronary artery disease Mother    Hypertension Mother    Heart disease Father    Atrial fibrillation Son    Lung cancer Sister    Heart disease Brother    Heart disease Brother    Healthy Son    Thyroid disease Daughter    Healthy Daughter    Healthy Daughter    FH: her son died 16-Aug-2017 massive heart attack   BP (!) 144/86   Pulse (!) 118   Ht '5\' 3"'$  (1.6 m)   Wt 81.8 kg (180 lb 6.4 oz)   SpO2 97%   BMI 31.96 kg/m   Wt Readings from Last 3 Encounters:  12/01/21 81.8 kg (180 lb 6.4 oz)  11/26/21 80.6 kg (177 lb 9.6 oz)  11/20/21 81.1 kg (178 lb 12.8 oz)   PHYSICAL EXAM: General:  NAD. No resp difficulty HEENT: Normal Neck: Supple. JVP difficult due to +v waves, but appears 6-7 cm Carotids 2+ bilat; no bruits. No lymphadenopathy or thryomegaly appreciated. Cor: PMI nondisplaced. Irregular rate & rhythm. No rubs, gallops, 3/6 HSM apex Lungs: Clear Abdomen: Soft, nontender, nondistended. No hepatosplenomegaly. No bruits or masses. Good bowel sounds. Extremities: No cyanosis, clubbing, rash, edema Neuro: Alert & oriented x 3, cranial nerves grossly intact. Moves all 4 extremities w/o difficulty. Affect pleasant.  ASSESSMENT & PLAN: 1. Acute on chronic systolic CHF: Mixed ischemic/nonischemic cardiomyopathy. Echo 1/23 with EF 25-30%, moderate RV dysfunction, moderate biatrial enlargement, at least moderate posteriorly-directed MR (may be worse).  Most recent prior echo  showed EF 50-55% in 9/20, EF was 20-25% on 6/16 echo.  It is not clear why EF has dropped since 9/20 (cath does not explain).  Atrial fibrillation has been chronic x years. Frequent PVCs are noted.  RHC showed elevated R>L heart filling pressures with pulmonary arterial hypertension and low CI 1.89. Cardiac MRI w/ myocardial LGE in septum is in a noncoronary pattern, ? myocarditis, anterior wall LGE is subendocardial and may be due to prior MI => ?mixed ischemic/nonischemic cardiomyopathy. Repeat 5/23 showed EF 30-35%, mildly dilated RV with mild RV systolic dysfunction, PASP 67 mmHg, moderate-severe functional MR with posterior leaflet restriction and dilated IVC. TEE (6/23) with EF 30-35%, moderate RV dysfunction, severe MR.  NYHA III. Volume is better today, but she still appears at least mildly volume overloaded on exam, REDs 35%.  This is complicated by cardiorenal syndrome.  - Increase torsemide to 80 mg bid and increase KCL to 60 mEq bid. BMET/BNP today, repeat BMET at follow up in 10-14 days. - Continue spironolactone 25 mg daily.  - Continue Farxiga 10 mg daily.  - Continue Imdur 60 mg daily and hydralazine 12.5 mg tid. - She will stay off losartan with elevated creatinine.   - Off Toprol with bradycardia. - Off digoxin with bradycardia. 2. Atrial fibrillation: This appears permanent, x years.   - Continue Eliquis 5 mg bid. No bleeding issues.  3. PVCs: Frequent during 2/23 admission but was also on milrinone. ? Contributing to cardiomyopathy.   - Now off amiodarone. 4. Mitral regurgitation: TEE 2/23 showed restriction of the posterior mitral leaflet and moderate eccentric mitral regurgitation (posteriorly-directed). Normal LV size w/ global HK EF  35-40%, RV size normal w/ moderate systolic dysfunction, severe LAE. Suspect functional MR, possibly atrial functional MR.  Cardiac MRI with moderate MR. Echo 5/23 showed EF 30-35%, mildly dilated RV with mild RV systolic dysfunction, PASP 67 mmHg,  moderate-severe functional MR with posterior leaflet restriction and dilated IVC. - TEE (6/23) with severe MR with restricted posterior leaflet in setting of dilation of the LA and LV.  - Planning for mTEER next month with Dr. Ali Lowe. Will try to optimize volume status before procedure. 5. CKD stage 3: Creatinine has trended higher. BMET today. 6. Pulmonary hypertension: On 2/23 RHC, PCWP not obtained, so with using LVEDP, PVR is 11 WU suggesting severe pulmonary arterial hypertension.  Etiology unclear.  Sleep study negative.  V/Q scan not suggestive of chronic PE. CT chest did not show interstitial lung disease. Serologic workup negative (ANA, anti-centromere ab, anti-scl70, RF) - Repeat RHC (6/23) showed PCWP 27. 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 7. CAD: Cath 2/23 with prox RCA to Mid RCA lesion 60% stenosis, distal LCx lesion 90% stenosis (similar to the past).  Medical management. Not clear that CAD can explain her cardiomyopathy. No further CP. - Continue statin.  - No ASA given Eliquis use.    Follow up in 3 weeks with APP for fluid check (ReDs and BMET) and 3 months with Dr. Aundra Dubin.   Maricela Bo [facility] FNP-BC 12/01/2021

## 2021-12-01 NOTE — Patient Instructions (Addendum)
Thank you for coming in today  Labs were done today, if any labs are abnormal the clinic will call you No news is good news  Your physician recommends that you return for lab work in: 2 weeks   Your physician recommends that you schedule a follow-up appointment in:  2-3 weeks in clinic  CHANGE Torsemide to 80 mg 4 tablets twice a day INCREASE Potassium to 3 tablets 60 meq twice daily   At the [facility], you and your health needs are our priority. As part of our continuing mission to provide you with exceptional heart care, we have created designated Provider Care Teams. These Care Teams include your primary Cardiologist (physician) and Advanced Practice Providers (APPs- Physician Assistants and Nurse Practitioners) who all work together to provide you with the care you need, when you need it.   You may see any of the following providers on your designated Care Team at your next follow up: Dr Glori Bickers Dr Haynes Kerns, NP Lyda Jester, [place] [facility] [place], [place] Audry Riles, PharmD   Please be sure to bring in all your medications bottles to every appointment.   If you have any questions or concerns before your next appointment please send [place] a message through [place] or call our office at [phone].    TO LEAVE A MESSAGE FOR THE NURSE SELECT OPTION 2, PLEASE LEAVE A MESSAGE INCLUDING: YOUR NAME DATE OF BIRTH CALL BACK NUMBER REASON FOR CALL**this is important as we prioritize the call backs  YOU WILL RECEIVE A CALL BACK THE SAME DAY AS LONG AS YOU CALL BEFORE 4:00 PM

## 2021-12-01 NOTE — Progress Notes (Signed)
ReDS Vest / Clip - 12/01/21 1531       ReDS Vest / Clip   Station Marker B    Ruler Value 24    ReDS Value Range Low volume    ReDS Actual Value 35    Anatomical Comments sitting

## 2021-12-02 ENCOUNTER — Telehealth (HOSPITAL_COMMUNITY): Payer: Self-pay

## 2021-12-02 ENCOUNTER — Telehealth (HOSPITAL_COMMUNITY): Payer: Self-pay | Admitting: *Deleted

## 2021-12-02 LAB — BRAIN NATRIURETIC PEPTIDE: B Natriuretic Peptide: 104.2 pg/mL — ABNORMAL HIGH (ref 0.0–100.0)

## 2021-12-02 MED ORDER — METOLAZONE 2.5 MG PO TABS: 2.5000 mg | ORAL_TABLET | ORAL | 1 refills | Status: DC | PRN

## 2021-12-02 NOTE — Telephone Encounter (Addendum)
Pt aware, agreeable, and verbalized understanding   ----- Message from Rafael Bihari, FNP sent at 12/01/2021  5:06 PM EDT ----- Kidney function mildly up.  Please only increase torsemide to 60 mg bid and keep potassium at 40 bid.  She has repeat labs arranged

## 2021-12-02 NOTE — Telephone Encounter (Signed)
Pts husband left vm stating metolazone was supposed to be sent to pts pharmacy yesterday. I do not see metolazone on her list or any mention of metolazone in office visit note.  Routed to [employer] for advice

## 2021-12-03 ENCOUNTER — Telehealth: Payer: Self-pay | Admitting: *Deleted

## 2021-12-03 NOTE — Telephone Encounter (Addendum)
Patient and husband called back. She reports her stomach is grumbling and cramping. Had a BM yesterday (small and hard). Informed her that she may be constipated and to start MiraLax bid and Colace bid until she has good, soft BM and then decrease to daily and to push fluids. She is not nauseated and did not have bone pain. Had CMP on 7/17 w/cardiology and asking if she still needs lab (BMP) on 7/26? Also has BMP scheduled for 7/31 with cardiology. Per Dr. Benay Spice: Cancel lab on 7/26 and f/u on 7/31 results. Husband made aware.

## 2021-12-03 NOTE — Telephone Encounter (Signed)
Received voice mail from Mr. Moylan reporting patient is "sick" since her zometa infusion on 11/26/21 and asking how long it will last and what could be done? Called back and there was no answer. Requested return call to discuss her specific symptoms.

## 2021-12-04 ENCOUNTER — Telehealth: Payer: Self-pay

## 2021-12-04 NOTE — Telephone Encounter (Signed)
TC from Pt's husband inquiring if his wife should continue taking miralax now that she has moved her bowels. Informed Pt's husband that he should stop taking miralax and continue her current medications she is taking. Pt's husband concerned about Pt being fatigued. Informed Pt's husband sometimes it takes a little longer for Pt's to bounce back. Informed him to continue watching her and making sure she hydrates and continues getting nutrition in. Pt's husband verbalized understanding. Aware if any other problems or concerns to return call to the office.

## 2021-12-05 ENCOUNTER — Other Ambulatory Visit (HOSPITAL_COMMUNITY): Payer: Medicare Other

## 2021-12-10 ENCOUNTER — Other Ambulatory Visit: Payer: Self-pay | Admitting: Medical

## 2021-12-10 ENCOUNTER — Inpatient Hospital Stay: Payer: Medicare Other

## 2021-12-10 NOTE — Telephone Encounter (Signed)
Pt saw Sula Soda once and was scheduled to follow up with her in Sept.

## 2021-12-11 ENCOUNTER — Other Ambulatory Visit: Payer: PRIVATE HEALTH INSURANCE

## 2021-12-11 ENCOUNTER — Ambulatory Visit: Payer: PRIVATE HEALTH INSURANCE

## 2021-12-11 ENCOUNTER — Ambulatory Visit: Payer: PRIVATE HEALTH INSURANCE | Admitting: Oncology

## 2021-12-15 ENCOUNTER — Ambulatory Visit (HOSPITAL_COMMUNITY)
Admission: RE | Admit: 2021-12-15 | Discharge: 2021-12-15 | Disposition: A | Discharge status: Home / Self Care | Payer: Medicare Other | Source: Ambulatory Visit | Attending: Cardiology | Admitting: Cardiology

## 2021-12-15 DIAGNOSIS — I5023 Acute on chronic systolic (congestive) heart failure: Secondary | ICD-10-CM | POA: Diagnosis present

## 2021-12-15 LAB — BASIC METABOLIC PANEL
Anion gap: 14 (ref 5–15)
BUN: 38 mg/dL — ABNORMAL HIGH (ref 8–23)
CO2: 28 mmol/L (ref 22–32)
Calcium: 8.9 mg/dL (ref 8.9–10.3)
Chloride: 93 mmol/L — ABNORMAL LOW (ref 98–111)
Creatinine, Ser: 2.2 mg/dL — ABNORMAL HIGH (ref 0.44–1.00)
GFR, Estimated: 23 mL/min — ABNORMAL LOW (ref >60)
Glucose, Bld: 153 mg/dL — ABNORMAL HIGH (ref 70–99)
Potassium: 3.1 mmol/L — ABNORMAL LOW (ref 3.5–5.1)
Sodium: 135 mmol/L (ref 135–145)

## 2021-12-16 ENCOUNTER — Telehealth (HOSPITAL_COMMUNITY): Payer: Self-pay

## 2021-12-16 DIAGNOSIS — I5042 Chronic combined systolic (congestive) and diastolic (congestive) heart failure: Secondary | ICD-10-CM

## 2021-12-16 MED ORDER — POTASSIUM CHLORIDE CRYS ER 20 MEQ PO TBCR: EXTENDED_RELEASE_TABLET | ORAL | 6 refills | Status: AC

## 2021-12-16 NOTE — Telephone Encounter (Signed)
Hutchins, FNP  12/16/2021  8:00 AM EDT Back to Top    K is low. Please ensure she is taking KCL 60 bid. If she is, please take extra 40 KCL today and  then increase daily KCL to 80 bid. Repeat BMEt in 1 week please  Patient's husband stated patient was taking her kcl rx 2 tablets twice a day. Per JM request for patient to take 3 tablets in the morning and 3 tablets in the evening.  Patient's lab appointment has been scheduled and lab placed med list has been up dated to reflect this change.  Pt aware, agreeable, and verbalized understanding .

## 2021-12-17 ENCOUNTER — Other Ambulatory Visit: Payer: Self-pay | Admitting: Cardiology

## 2021-12-23 ENCOUNTER — Other Ambulatory Visit (HOSPITAL_COMMUNITY): Payer: Self-pay

## 2021-12-23 ENCOUNTER — Ambulatory Visit (HOSPITAL_COMMUNITY)
Admission: RE | Admit: 2021-12-23 | Discharge: 2021-12-23 | Disposition: A | Discharge status: Home / Self Care | Payer: Medicare Other | Source: Ambulatory Visit | Attending: Cardiology | Admitting: Cardiology

## 2021-12-23 DIAGNOSIS — I5042 Chronic combined systolic (congestive) and diastolic (congestive) heart failure: Secondary | ICD-10-CM | POA: Insufficient documentation

## 2021-12-23 LAB — BASIC METABOLIC PANEL
Anion gap: 13 (ref 5–15)
BUN: 42 mg/dL — ABNORMAL HIGH (ref 8–23)
CO2: 26 mmol/L (ref 22–32)
Calcium: 9.3 mg/dL (ref 8.9–10.3)
Chloride: 95 mmol/L — ABNORMAL LOW (ref 98–111)
Creatinine, Ser: 2.21 mg/dL — ABNORMAL HIGH (ref 0.44–1.00)
GFR, Estimated: 23 mL/min — ABNORMAL LOW (ref >60)
Glucose, Bld: 118 mg/dL — ABNORMAL HIGH (ref 70–99)
Potassium: 4.4 mmol/L (ref 3.5–5.1)
Sodium: 134 mmol/L — ABNORMAL LOW (ref 135–145)

## 2021-12-24 ENCOUNTER — Encounter: Payer: Self-pay | Admitting: Oncology

## 2021-12-24 ENCOUNTER — Other Ambulatory Visit: Payer: Self-pay | Admitting: Nurse Practitioner

## 2021-12-24 DIAGNOSIS — C50919 Malignant neoplasm of unspecified site of unspecified female breast: Secondary | ICD-10-CM

## 2021-12-24 MED ORDER — OXYCODONE HCL 10 MG PO TABS: 5.0000 mg | ORAL_TABLET | Freq: Three times a day (TID) | ORAL | 0 refills | Status: AC | PRN

## 2021-12-29 ENCOUNTER — Encounter (HOSPITAL_COMMUNITY): Payer: Self-pay

## 2021-12-29 ENCOUNTER — Ambulatory Visit (HOSPITAL_COMMUNITY)
Admission: RE | Admit: 2021-12-29 | Discharge: 2021-12-29 | Disposition: A | Discharge status: Home / Self Care | Payer: Medicare Other | Source: Ambulatory Visit | Attending: Family Medicine | Admitting: Family Medicine

## 2021-12-29 ENCOUNTER — Telehealth: Payer: Self-pay | Admitting: Medical

## 2021-12-29 VITALS — BP 120/78 | HR 118 | Wt 180.4 lb

## 2021-12-29 DIAGNOSIS — Z79899 Other long term (current) drug therapy: Secondary | ICD-10-CM | POA: Insufficient documentation

## 2021-12-29 DIAGNOSIS — I252 Old myocardial infarction: Secondary | ICD-10-CM | POA: Diagnosis not present

## 2021-12-29 DIAGNOSIS — Z96641 Presence of right artificial hip joint: Secondary | ICD-10-CM | POA: Diagnosis not present

## 2021-12-29 DIAGNOSIS — E1122 Type 2 diabetes mellitus with diabetic chronic kidney disease: Secondary | ICD-10-CM | POA: Insufficient documentation

## 2021-12-29 DIAGNOSIS — R079 Chest pain, unspecified: Secondary | ICD-10-CM | POA: Diagnosis not present

## 2021-12-29 DIAGNOSIS — I4821 Permanent atrial fibrillation: Secondary | ICD-10-CM | POA: Diagnosis not present

## 2021-12-29 DIAGNOSIS — R7989 Other specified abnormal findings of blood chemistry: Secondary | ICD-10-CM | POA: Insufficient documentation

## 2021-12-29 DIAGNOSIS — Z853 Personal history of malignant neoplasm of breast: Secondary | ICD-10-CM | POA: Diagnosis not present

## 2021-12-29 DIAGNOSIS — I272 Pulmonary hypertension, unspecified: Secondary | ICD-10-CM

## 2021-12-29 DIAGNOSIS — Z7984 Long term (current) use of oral hypoglycemic drugs: Secondary | ICD-10-CM | POA: Diagnosis not present

## 2021-12-29 DIAGNOSIS — Z7901 Long term (current) use of anticoagulants: Secondary | ICD-10-CM | POA: Insufficient documentation

## 2021-12-29 DIAGNOSIS — Z452 Encounter for adjustment and management of vascular access device: Secondary | ICD-10-CM | POA: Insufficient documentation

## 2021-12-29 DIAGNOSIS — I2510 Atherosclerotic heart disease of native coronary artery without angina pectoris: Secondary | ICD-10-CM | POA: Diagnosis not present

## 2021-12-29 DIAGNOSIS — Z9861 Coronary angioplasty status: Secondary | ICD-10-CM

## 2021-12-29 DIAGNOSIS — I130 Hypertensive heart and chronic kidney disease with heart failure and stage 1 through stage 4 chronic kidney disease, or unspecified chronic kidney disease: Secondary | ICD-10-CM | POA: Insufficient documentation

## 2021-12-29 DIAGNOSIS — E785 Hyperlipidemia, unspecified: Secondary | ICD-10-CM | POA: Diagnosis not present

## 2021-12-29 DIAGNOSIS — Z955 Presence of coronary angioplasty implant and graft: Secondary | ICD-10-CM | POA: Diagnosis not present

## 2021-12-29 DIAGNOSIS — N1831 Chronic kidney disease, stage 3a: Secondary | ICD-10-CM

## 2021-12-29 DIAGNOSIS — I428 Other cardiomyopathies: Secondary | ICD-10-CM | POA: Insufficient documentation

## 2021-12-29 DIAGNOSIS — I493 Ventricular premature depolarization: Secondary | ICD-10-CM | POA: Insufficient documentation

## 2021-12-29 DIAGNOSIS — I2721 Secondary pulmonary arterial hypertension: Secondary | ICD-10-CM | POA: Insufficient documentation

## 2021-12-29 DIAGNOSIS — C50919 Malignant neoplasm of unspecified site of unspecified female breast: Secondary | ICD-10-CM | POA: Diagnosis not present

## 2021-12-29 DIAGNOSIS — I340 Nonrheumatic mitral (valve) insufficiency: Secondary | ICD-10-CM | POA: Insufficient documentation

## 2021-12-29 DIAGNOSIS — I5023 Acute on chronic systolic (congestive) heart failure: Secondary | ICD-10-CM

## 2021-12-29 MED ORDER — METOLAZONE 2.5 MG PO TABS: 2.5000 mg | ORAL_TABLET | ORAL | 5 refills | Status: AC

## 2021-12-29 NOTE — Progress Notes (Addendum)
ADVANCED HF CLINIC NOTE   Primary Care: Carlena Hurl, PA-C Primary Cardiologist: Dr. Ellyn Hack HF Cardiologist: Dr. Aundra Dubin  HPI: Stephanie Franco is a 71 y.o. with history of permanent atrial fibrillation for 20 years, DMII, HTN, Hyperlipidemia, left breast cancer (had mastectomy 20 years ago), right hip replacement, arthritis,  CAD with prior PCI OM in 2355 and systolic heart failure.    Had sleep study 05/2021 - negative for sleep apnea.   She was admitted 06/20/21 with chest pain, but also noted to have orthopnea and worsening dyspnea. HS Trop 71>58>51. Echo showed EF 25-30%, moderate RV dysfunction, moderate biatrial enlargement, at least moderate posteriorly-directed MR. LHC/RHC showed stable CAD with elevated R>L heart filling pressures and low cardiac index 1.89.  AHF team consulted. She was started on milrinone, PICC placed. Started on IV lasix and added amiodarone given frequent PVCs. Underwent further w/u of pulmonary hypertension and HF. V/Q scan was not suggestive of chronic PE.  Serologic workup negative (ANA, anti-centromere ab, anti-scl70, RF). TEE was done to better assess severity of her MR. This showed restriction of the posterior mitral leaflet and moderate eccentric mitral regurgitation (posteriorly-directed), normal LV size w/ global HK EF 35-40%, RV size normal w/ moderate systolic dysfunction, severe LAE. Suspect functional MR, possibly atrial functional MR. 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  LVEF on MRI 36%, MR moderate. She was weaned off of milrinone w/ stable co-ox and transition to PO diuretics, torsemide 40 mg daily. GDMT added. SCr stabilized ~1.9. She was discharged home, weight 188 lbs.  Post hospital follow up 2/23, mildly volume up and instructed to take extra 20 mg of torsemide x 2 day and Imdur increased. SCr stable at 1.58  Seen in ED 3/23 with CP, SOB and elevated SCr.  Cards saw, felt she was on the dry side. Given gentle IVF, digoxin and amio stopped due to bradycardia/junctional rhythm, and hydralazine stopped due to fatigue.   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  Biopsy was done showing breast cancer primary. She has been started on letrozole.   Echo 5/23 showed EF 30-35%, mildly dilated RV with mild RV systolic dysfunction, PASP 67 mmHg, moderate-severe functional MR with posterior leaflet restriction and dilated IVC.  She has been off losartan, and torsemide has been decreased to 20 mg bid due to elevated creatinine (most recently 2.08).   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 TEE showed EF 30-35%, moderate RV systolic dysfunction, moderate TR, severe MR. Plan to assess for MitraClip.  RHC follow up 6/23, she was more short of breath and volume overloaded, REDs clip 47%. She was instructed to increase torsemide to 40 mg bid and take a dose of metolazone 2.5 mg + 40 KCL.  Today she returns for HF follow up with her husband. Has good and bad days. More SOB recently, dyspnea with ADLs and walking short distances on flat ground. She took 2 doses of metolazone on her own last week after gaining > 5 lbs. Felt better after brisk urination. Denies CP, dizziness, edema, abnormal bleeding, or PND/Orthopnea. Appetite ok. No fever or chills. Weight at home 174 pounds. Taking all medications.   ReDs: 44%  ECG (personally reviewed):  none ordered today.  Labs (2/23): K 4.0, creatinine 1.97 Labs (3/23): K 3.9,  creatinine 1.45 Labs (5/23): K 4.7, creatinine 2.08 Labs (6/23): K 4.1, creatinine 1.79 Labs (8/23): K 4.4, creatinine 2.21  PMH: 1. Type 2 diabetes 2. Breast cancer: Mastectomy 2008.  Bony metastases found 5/23, biopsy showed breast primary.  3. CKD stage 3 4.  Atrial fibrillation: Permanent.  5. CAD: DES to LCx in 2006.  - LHC (2/23): 60% proximal RCA, 90% distal LCx.  Medical management.  6. Gout 7. Hyperlipidemia 8. HTN 9. Chronic systolic CHF: Primarily nonischemic cardiomyopathy.  - Echo (2019): EF 40-45% RV moderately dilated.  - Echo (2020): EF 50-55%  LA and RA moderately dilated. - Echo (2/23): EF 25%,  RV moderately reduced systolic function, LA severely dilated, RA moderately dilated, MV restricted posterior leaflet  with moderate MR.  - cMRI (2/23): Findings suggest mixed ischemic/nonischemic cardiomyopathy (?myocarditis with prior coronary disease).  LVEF on MRI 36%. MR moderate.  - RHC (2/23): RA 21, PA 80/30, LVEDP 14, CI 1.9, PVR 11 WU. - Echo (5/23): EF 30-35%, mildly dilated RV with mild RV systolic dysfunction, PASP 67 mmHg, moderate-severe functional MR with posterior leaflet restriction and dilated IVC.  10. Mitral regurgitation: Suspect functional MR, possibly atrial functional MR.  - Moderate-severe MR with restricted posterior leaflet on 5/23 echo.  - TEE (6/23) showed severe MR with restricted posterior leaflet in setting of dilation of the LA and LV. 11. Pulmonary hypertension:  - RHC (2/23): RA 21, PA 80/30, LVEDP 14, CI 1.9, PVR 11 WU. - VQ scan (2/23): no evidence for chronic PE - Sleep study negative - Serologic workup negative (ANA, anti-centromere ab, anti-scl70, RF) - CT chest (5/23) did not show ILD.  - RHC (6/23): RA 16, PA 86/41, PCWP 27, CI 2.36, PVR 6.5 WU 12. PVCs  Current Outpatient Medications  Medication Sig Dispense Refill   acetaminophen (TYLENOL) 500 MG tablet Take 1,000 mg by mouth every 6 (six) hours as needed for mild pain.     albuterol (PROVENTIL HFA;VENTOLIN HFA) 108 (90 Base) MCG/ACT inhaler Inhale 1-2 puffs into the lungs every 6 (six) hours as needed for wheezing or shortness of breath. 1 Inhaler 0   albuterol (PROVENTIL) (2.5 MG/3ML) 0.083% nebulizer solution Take 3 mLs (2.5 mg total) by  nebulization every 6 (six) hours as needed for wheezing or shortness of breath. 75 mL 12   allopurinol (ZYLOPRIM) 300 MG tablet TAKE 1 TABLET BY MOUTH EVERY DAY AS NEEDED 90 tablet 1   Biotin 1000 MCG tablet Take 1,000 mcg by mouth daily.     ELIQUIS 5 MG TABS tablet TAKE 1 TABLET BY MOUTH TWICE A DAY 180 tablet 2   ezetimibe (ZETIA) 10 MG tablet Take 1 tablet (10 mg total) by mouth daily. 30 tablet 5   FARXIGA 10 MG TABS tablet TAKE 1 TABLET BY MOUTH DAILY 90 tablet 2   fexofenadine (ALLEGRA) 180 MG tablet Take 180 mg by mouth daily as needed for allergies.   3   fluticasone (FLONASE) 50 MCG/ACT nasal spray Place 2 sprays into the nose daily as needed for allergies or rhinitis.      gabapentin (NEURONTIN) 600 MG tablet TAKE 1 TABLET BY MOUTH THREE TIMES A DAY 270 tablet 0   glucose blood test strip 1 each by Other route as needed for other. Use as instructed     hydrALAZINE (APRESOLINE) 25 MG tablet Take 0.5 tablets (12.5 mg total) by mouth 3 (three) times daily. 80 tablet 4   Hydrocortisone, Perianal, 1 % CREA Apply 1 application. topically 3 (  three) times daily as needed (irritation).     isosorbide mononitrate (IMDUR) 60 MG 24 hr tablet Take 60 mg by mouth daily.     letrozole (FEMARA) 2.5 MG tablet Take 1 tablet (2.5 mg total) by mouth daily. 30 tablet 5   LORazepam (ATIVAN) 1 MG tablet Take 1 mg by mouth 2 (two) times daily as needed for anxiety.     metolazone (ZAROXOLYN) 2.5 MG tablet Take 1 tablet (2.5 mg total) by mouth as needed. For weight gain of more than 3lbs in 24 hours or 5lbs in a week 5 tablet 1   Multiple Vitamins-Minerals (MULTIVITAMIN WITH MINERALS) tablet Take 1 tablet by mouth daily.     Naphazoline-Glycerin (CLEAR EYES MAX REDNESS RELIEF OP) Place 2 drops into both eyes daily as needed (redness).     nitroGLYCERIN (NITROSTAT) 0.4 MG SL tablet DISSOLVE 1 TABLET UNDER THE TONGUE EVERY 5 MINUTES AS NEEDED FOR CHEST PAIN. 25 tablet 6   nystatin (MYCOSTATIN/NYSTOP) powder Apply  1 Application topically as needed (rash).     ondansetron (ZOFRAN) 4 MG tablet Take 1 tablet (4 mg total) by mouth every 6 (six) hours as needed for nausea. 20 tablet 0   OVER THE COUNTER MEDICATION Apply 1 application. topically daily as needed (pain). Apply to legs and feet     Oxycodone HCl 10 MG TABS Take 0.5 tablets (5 mg total) by mouth 3 (three) times daily as needed. For pain 45 tablet 0   pantoprazole (PROTONIX) 40 MG tablet TAKE 1 TABLET BY MOUTH EVERY DAY 90 tablet 3   potassium chloride SA (KLOR-CON M) 20 MEQ tablet Take 3 tablets by mouth in the morning and evening 90 tablet 6   rosuvastatin (CRESTOR) 40 MG tablet Take 40 mg by mouth daily.     senna-docusate (SENOKOT-S) 8.6-50 MG tablet Take 1 tablet by mouth 2 (two) times daily. 60 tablet 0   spironolactone (ALDACTONE) 25 MG tablet Take 1 tablet (25 mg total) by mouth daily. 30 tablet 0   torsemide (DEMADEX) 20 MG tablet Patient take 3 tablets in the morning and evening.     TOUJEO SOLOSTAR 300 UNIT/ML Solostar Pen Inject 30 Units into the skin 2 (two) times daily.     No current facility-administered medications for this encounter.   No Known Allergies  Social History   Socioeconomic History   Marital status: Married    Spouse name: Not on file   Number of children: 4   Years of education: Not on file   Highest education level: Not on file  Occupational History   Not on file  Tobacco Use   Smoking status: Former    Packs/day: 0.50    Years: 6.00    Total pack years: 3.00    Types: Cigarettes    Quit date: 05/18/1992    Years since quitting: 29.6   Smokeless tobacco: Never   Tobacco comments:    Former smoker 08/15/21  Vaping Use   Vaping Use: Never used  Substance and Sexual Activity   Alcohol use: No    Comment: 07/13/11 "have drank occasionally; not now"   Drug use: No   Sexual activity: Not Currently    Birth control/protection: Surgical  Other Topics Concern   Not on file  Social History Narrative    She is a married mother of 61, grandmother 2. Usually accompanied by her husband. She does not work. She had been working on her exercise, but is no longer as active. Does not  drink and does not smoke   Social Determinants of Radio broadcast assistant Strain: Not on file  Food Insecurity: Not on file  Transportation Needs: Not on file  Physical Activity: Not on file  Stress: Not on file  Social Connections: Not on file  Intimate Partner Violence: Not on file   Family History  Problem Relation Age of Onset   Coronary artery disease Mother    Hypertension Mother    Heart disease Father    Atrial fibrillation Son    Lung cancer Sister    Heart disease Brother    Heart disease Brother    Healthy Son    Thyroid disease Daughter    Healthy Daughter    Healthy Daughter    FH: her son died 2017-08-27 massive heart attack   BP 120/78   Pulse (!) 118   Wt 81.8 kg (180 lb 6.4 oz)   SpO2 97%   BMI 31.96 kg/m   Wt Readings from Last 3 Encounters:  12/29/21 81.8 kg (180 lb 6.4 oz)  12/01/21 81.8 kg (180 lb 6.4 oz)  11/26/21 80.6 kg (177 lb 9.6 oz)   PHYSICAL EXAM: General:  NAD. No resp difficulty HEENT: Normal Neck: Supple. JVP difficult due to v waves, but looks like 7-8. Carotids 2+ bilat; no bruits. No lymphadenopathy or thryomegaly appreciated. Cor: PMI nondisplaced. Irregular rate & rhythm. No rubs, gallops, 3/6 HSM LLSB Lungs: Clear Abdomen: Soft, nontender, nondistended. No hepatosplenomegaly. No bruits or masses. Good bowel sounds. Extremities: No cyanosis, clubbing, rash, edema Neuro: Alert & oriented x 3, cranial nerves grossly intact. Moves all 4 extremities w/o difficulty. Affect pleasant.  ASSESSMENT & PLAN: 1. Acute on chronic systolic CHF: Mixed ischemic/nonischemic cardiomyopathy. Echo 1/23 with EF 25-30%, moderate RV dysfunction, moderate biatrial enlargement, at least moderate posteriorly-directed MR (may be worse).  Most recent prior echo showed EF 50-55% in 9/20,  EF was 20-25% on 6/16 echo.  It is not clear why EF has dropped since 9/20 (cath does not explain).  Atrial fibrillation has been chronic x years. Frequent PVCs are noted.  RHC showed elevated R>L heart filling pressures with pulmonary arterial hypertension and low CI 1.89. Cardiac MRI w/ myocardial LGE in septum is in a noncoronary pattern, ? myocarditis, anterior wall LGE is subendocardial and may be due to prior MI => ?mixed ischemic/nonischemic cardiomyopathy. Repeat 5/23 showed EF 30-35%, mildly dilated RV with mild RV systolic dysfunction, PASP 67 mmHg, moderate-severe functional MR with posterior leaflet restriction and dilated IVC. TEE (6/23) with EF 30-35%, moderate RV dysfunction, severe MR.  NYHA III-IIIb. She is volume overloaded today, REDs 44%. This is complicated by cardiorenal syndrome.  - Add metolazone 2.5 mg + extra 40 KCL every Monday. Recent labs OK. Check BMET and BNP in 1 week. - Continue torsemide 60 mg bid + 60 KCL bid.  - Continue spironolactone 25 mg daily.  - Continue Farxiga 10 mg daily.  - Continue Imdur 60 mg daily and hydralazine 12.5 mg tid. - She will stay off losartan with elevated creatinine.   - Off Toprol with bradycardia. - Off digoxin with bradycardia. 2. Atrial fibrillation: This appears permanent, x years.   - Continue Eliquis 5 mg bid. No bleeding issues.  3. PVCs: Frequent during 2/23 admission but was also on milrinone. ? Contributing to cardiomyopathy.   - Now off amiodarone. 4. Mitral regurgitation: TEE 2/23 showed restriction of the posterior mitral leaflet and moderate eccentric mitral regurgitation (posteriorly-directed). Normal LV size w/ global  HK EF 35-40%, RV size normal w/ moderate systolic dysfunction, severe LAE. Suspect functional MR, possibly atrial functional MR.  Cardiac MRI with moderate MR. Echo 5/23 showed EF 30-35%, mildly dilated RV with mild RV systolic dysfunction, PASP 67 mmHg, moderate-severe functional MR with posterior leaflet  restriction and dilated IVC. - TEE (6/23) with severe MR with restricted posterior leaflet in setting of dilation of the LA and LV.  - Planning for mTEER next week with Dr. Ali Lowe. Will try to optimize volume status before procedure. 5. CKD stage 3: Creatinine has trended higher. Most recently 2.21 6. Pulmonary hypertension: On 2/23 RHC, PCWP not obtained, so with using LVEDP, PVR is 11 WU suggesting severe pulmonary arterial hypertension.  Etiology unclear.  Sleep study negative.  V/Q scan not suggestive of chronic PE. CT chest did not show interstitial lung disease. Serologic workup negative (ANA, anti-centromere ab, anti-scl70, RF) - Repeat RHC (6/23) showed PCWP 27. 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 7. CAD: Cath 2/23 with prox RCA to Mid RCA lesion 60% stenosis, distal LCx lesion 90% stenosis (similar to the past).  Medical management. Not clear that CAD can explain her cardiomyopathy. No further CP. - Continue statin.  - No ASA given Eliquis use.    Follow up with Dr. Aundra Dubin as scheduled.  Maricela Bo [facility] FNP-BC 12/29/2021

## 2021-12-29 NOTE — Patient Instructions (Signed)
No Labs done today.   INCREASE Metolazone to 2.'5mg'$  (1 tablet) by mouth every Monday. Take an extra 2 tablets of Potassium when taking Metolazone as well.   No other medication changes were made. Please continue all current medications as prescribed.  Your physician recommends that you keep your scheduled follow-up appointment with Dr. Aundra Dubin.  If you have any questions or concerns before your next appointment please send [place] a message through [place] or call our office at [phone].    TO LEAVE A MESSAGE FOR THE NURSE SELECT OPTION 2, PLEASE LEAVE A MESSAGE INCLUDING: YOUR NAME DATE OF BIRTH CALL BACK NUMBER REASON FOR CALL**this is important as we prioritize the call backs  YOU WILL RECEIVE A CALL BACK THE SAME DAY AS LONG AS YOU CALL BEFORE 4:00 PM   Do the following things EVERYDAY: Weigh yourself in the morning before breakfast. Write it down and keep it in a log. Take your medicines as prescribed Eat low salt foods--Limit salt (sodium) to 2000 mg per day.  Stay as active as you can everyday Limit all fluids for the day to less than 2 liters   At the [facility], you and your health needs are our priority. As part of our continuing mission to provide you with exceptional heart care, we have created designated Provider Care Teams. These Care Teams include your primary Cardiologist (physician) and Advanced Practice Providers (APPs- Physician Assistants and Nurse Practitioners) who all work together to provide you with the care you need, when you need it.   You may see any of the following providers on your designated Care Team at your next follow up: Dr Glori Bickers Dr Haynes Kerns, NP Lyda Jester, [place] Audry Riles, PharmD   Please be sure to bring in all your medications bottles to every appointment.

## 2021-12-29 NOTE — Telephone Encounter (Signed)
Received requested records from [facility]

## 2021-12-29 NOTE — Addendum Note (Signed)
Encounter addended by: Shonna Chock, CMA on: 4/73/9584 2:52 PM  Actions taken: Order list changed, Clinical Note Signed

## 2021-12-29 NOTE — Progress Notes (Signed)
ReDS Vest / Clip - 12/29/21 1400       ReDS Vest / Clip   Station Marker A    Ruler Value 28    ReDS Value Range High volume overload    ReDS Actual Value 44

## 2022-01-01 ENCOUNTER — Telehealth: Payer: Self-pay

## 2022-01-01 NOTE — Telephone Encounter (Signed)
Confirmed with the patient's husband the plan for next week.  He verbalized understanding of PAT appointment 8/21 at 0900 and MV repair on 8/24.  Will send MyChart message again with instructions/directions. He was grateful for call and agrees with plan.

## 2022-01-02 ENCOUNTER — Ambulatory Visit: Payer: Medicare Other | Admitting: Medical

## 2022-01-02 NOTE — Progress Notes (Signed)
Surgical Instructions    Your procedure is scheduled on Thursday August 24th.  Report to [facility] Main Entrance "A" at 8:45 A.M., then check in with the Admitting office.  Call this number if you have problems the morning of surgery:  [phone]   If you have any questions prior to your surgery date call [phone]: Open Monday-Friday 8am-4pm    Remember:  Do not eat or drink after midnight the night before your surgery     Take these medicines the morning of surgery with A SIP OF WATER:  Take last doses of Eliquis 8/21 (stop after 8/21 PM dose).  The morning of your procedure, HOLD Farxiga, insulin glargine (Toujeo), hydralazine, torsemide, losartan, and spironolactone   gabapentin (NEURONTIN) 600 MG tablet ezetimibe (ZETIA) 10 MG tablet fexofenadine (ALLEGRA) 180 MG tablet hydrALAZINE (APRESOLINE) 25 MG tablet isosorbide mononitrate (IMDUR) 60 MG 24 hr tablet letrozole (FEMARA) 2.5 MG tablet pantoprazole (PROTONIX) 40 MG tablet potassium chloride SA (KLOR-CON M) 20 MEQ tablet rosuvastatin (CRESTOR) 40 MG tablet  IF NEEDED acetaminophen (TYLENOL) 500 MG tablet albuterol (PROVENTIL HFA;VENTOLIN HFA) 108 (90 Base) MCG/ACT inhaler - please bring with you to the hospital fexofenadine (ALLEGRA) 180 MG tablet LORazepam (ATIVAN) 1 MG tablet Naphazoline-Glycerin (CLEAR EYES MAX REDNESS RELIEF OP) nitroGLYCERIN (NITROSTAT) 0.4 MG SL tablet ondansetron (ZOFRAN) 4 MG tablet Oxycodone HCl 10 MG TABS    As of today, STOP taking any Aspirin (unless otherwise instructed by your surgeon) Aleve, Naproxen, Ibuprofen, Motrin, Advil, Goody's, BC's, all herbal medications, fish oil, and all vitamins.  WHAT DO I DO ABOUT MY DIABETES MEDICATION?   Do not take oral diabetes medicines (pills) the morning of surgery.  THE NIGHT BEFORE SURGERY, take 50% (15 units) of Toujeo insulin.     THE MORNING BEFORE SURGERY, take 100% (30 units) of Toujeo insulin.  THE DAY OF SURGERY - DO NOT  TAKE any Toujeo insulin.  The day of surgery, do not take other diabetes injectables, including Byetta (exenatide), Bydureon (exenatide ER), Victoza (liraglutide), or Trulicity (dulaglutide).    HOW TO MANAGE YOUR DIABETES BEFORE AND AFTER SURGERY  Why is it important to control my blood sugar before and after surgery? Improving blood sugar levels before and after surgery helps healing and can limit problems. A way of improving blood sugar control is eating a healthy diet by:  Eating less sugar and carbohydrates  Increasing activity/exercise  Talking with your doctor about reaching your blood sugar goals High blood sugars (greater than 180 mg/dL) can raise your risk of infections and slow your recovery, so you will need to focus on controlling your diabetes during the weeks before surgery. Make sure that the doctor who takes care of your diabetes knows about your planned surgery including the date and location.  How do I manage my blood sugar before surgery? Check your blood sugar at least 4 times a day, starting 2 days before surgery, to make sure that the level is not too high or low.  Check your blood sugar the morning of your surgery when you wake up and every 2 hours until you get to the Short Stay unit.  If your blood sugar is less than 70 mg/dL, you will need to treat for low blood sugar: Do not take insulin. Treat a low blood sugar (less than 70 mg/dL) with  cup of clear juice (cranberry or apple), 4 glucose tablets, OR glucose gel. Recheck blood sugar in 15 minutes after treatment (to make sure it is greater than  70 mg/dL). If your blood sugar is not greater than 70 mg/dL on recheck, call [phone] for further instructions. Report your blood sugar to the short stay nurse when you get to Short Stay.  If you are admitted to the hospital after surgery: Your blood sugar will be checked by the staff and you will probably be given insulin after surgery (instead of oral diabetes  medicines) to make sure you have good blood sugar levels. The goal for blood sugar control after surgery is 80-180 mg/dL.  PREPARING FOR SURGERY       Do not wear jewelry or makeup. Do not wear lotions, powders, perfumes or deodorant. Do not shave 48 hours prior to surgery.   Do not bring valuables to the hospital. Do not wear nail polish, gel polish, artificial nails, or any other type of covering on natural nails (fingers and toes) If you have artificial nails or gel coating that need to be removed by a nail salon, please have this removed prior to surgery. Artificial nails or gel coating may interfere with anesthesia's ability to adequately monitor your vital signs.  [facility] is not responsible for any belongings or valuables.    Do NOT Smoke (Tobacco/Vaping)  24 hours prior to your procedure  If you use a CPAP at night, you may bring your mask for your overnight stay.   Contacts, glasses, hearing aids, dentures or partials may not be worn into surgery, please bring cases for these belongings   For patients admitted to the hospital, discharge time will be determined by your treatment team.   Patients discharged the day of surgery will not be allowed to drive home, and someone needs to stay with them for 24 hours.   SURGICAL WAITING ROOM VISITATION Patients having surgery or a procedure may have no more than 2 support people in the waiting area - these visitors may rotate.   Children under the age of 61 must have an adult with them who is not the patient. If the patient needs to stay at the hospital during part of their recovery, the visitor guidelines for inpatient rooms apply. Pre-op nurse will coordinate an appropriate time for 1 support person to accompany patient in pre-op.  This support person may not rotate.   Please refer to the [facility] website for the visitor guidelines for Inpatients (after your surgery is over and you are in a regular room).    Special  instructions:    Oral Hygiene is also important to reduce your risk of infection.  Remember - BRUSH YOUR TEETH THE MORNING OF SURGERY WITH YOUR REGULAR TOOTHPASTE   [place]- Preparing For Surgery  Before surgery, you can play an important role. Because skin is not sterile, your skin needs to be as free of germs as possible. You can reduce the number of germs on your skin by washing with CHG (chlorahexidine gluconate) Soap before surgery.  CHG is an antiseptic cleaner which kills germs and bonds with the skin to continue killing germs even after washing.     Please do not use if you have an allergy to CHG or antibacterial soaps. If your skin becomes reddened/irritated stop using the CHG.  Do not shave (including legs and underarms) for at least 48 hours prior to first CHG shower. It is OK to shave your face.  Please follow these instructions carefully.     Shower the NIGHT BEFORE SURGERY and the MORNING OF SURGERY with CHG Soap.   If you chose to  wash your hair, wash your hair first as usual with your normal shampoo. After you shampoo, rinse your hair and body thoroughly to remove the shampoo.  Then [employer] and genitals (private parts) with your normal soap and rinse thoroughly to remove soap.  After that Use CHG Soap as you would any other liquid soap. You can apply CHG directly to the skin and wash gently with a scrungie or a clean washcloth.   Apply the CHG Soap to your body ONLY FROM THE NECK DOWN.  Do not use on open wounds or open sores. Avoid contact with your eyes, ears, mouth and genitals (private parts). Wash Face and genitals (private parts)  with your normal soap.   Wash thoroughly, paying special attention to the area where your surgery will be performed.  Thoroughly rinse your body with warm water from the neck down.  DO NOT shower/wash with your normal soap after using and rinsing off the CHG Soap.  Pat yourself dry with a CLEAN TOWEL.  Wear CLEAN PAJAMAS to bed the  night before surgery  Place CLEAN SHEETS on your bed the night before your surgery  DO NOT SLEEP WITH PETS.   Day of Surgery:  Take a shower with CHG soap. Wear Clean/Comfortable clothing the morning of surgery Do not apply any deodorants/lotions.   Remember to brush your teeth WITH YOUR REGULAR TOOTHPASTE.    If you received a COVID test during your pre-op visit, it is requested that you wear a mask when out in public, stay away from anyone that may not be feeling well, and notify your surgeon if you develop symptoms. If you have been in contact with anyone that has tested positive in the last 10 days, please notify your surgeon.    Please read over the following fact sheets that you were given.

## 2022-01-05 ENCOUNTER — Encounter (HOSPITAL_COMMUNITY)
Admission: RE | Admit: 2022-01-05 | Discharge: 2022-01-05 | Disposition: A | Discharge status: Home / Self Care | Payer: Medicare Other | Source: Ambulatory Visit | Attending: Internal Medicine | Admitting: Internal Medicine

## 2022-01-05 ENCOUNTER — Encounter (HOSPITAL_COMMUNITY): Payer: Self-pay

## 2022-01-05 ENCOUNTER — Ambulatory Visit (HOSPITAL_COMMUNITY)
Admission: RE | Admit: 2022-01-05 | Discharge: 2022-01-05 | Disposition: A | Discharge status: Home / Self Care | Payer: Medicare Other | Source: Ambulatory Visit | Attending: Internal Medicine | Admitting: Internal Medicine

## 2022-01-05 ENCOUNTER — Other Ambulatory Visit: Payer: Self-pay

## 2022-01-05 VITALS — BP 118/101 | HR 66 | Resp 18 | Ht 63.0 in | Wt 172.9 lb

## 2022-01-05 DIAGNOSIS — A419 Sepsis, unspecified organism: Secondary | ICD-10-CM | POA: Diagnosis not present

## 2022-01-05 DIAGNOSIS — Z794 Long term (current) use of insulin: Secondary | ICD-10-CM

## 2022-01-05 DIAGNOSIS — Z20822 Contact with and (suspected) exposure to covid-19: Secondary | ICD-10-CM | POA: Diagnosis not present

## 2022-01-05 DIAGNOSIS — I340 Nonrheumatic mitral (valve) insufficiency: Secondary | ICD-10-CM | POA: Diagnosis not present

## 2022-01-05 DIAGNOSIS — Z01818 Encounter for other preprocedural examination: Secondary | ICD-10-CM | POA: Insufficient documentation

## 2022-01-05 DIAGNOSIS — E119 Type 2 diabetes mellitus without complications: Secondary | ICD-10-CM | POA: Insufficient documentation

## 2022-01-05 DIAGNOSIS — Z006 Encounter for examination for normal comparison and control in clinical research program: Secondary | ICD-10-CM | POA: Diagnosis not present

## 2022-01-05 HISTORY — DX: Anxiety disorder, unspecified: F41.9

## 2022-01-05 LAB — CBC
HCT: 34.3 % — ABNORMAL LOW (ref 36.0–46.0)
Hemoglobin: 10.3 g/dL — ABNORMAL LOW (ref 12.0–15.0)
MCH: 26.1 pg (ref 26.0–34.0)
MCHC: 30 g/dL (ref 30.0–36.0)
MCV: 87.1 fL (ref 80.0–100.0)
Platelets: 233 10*3/uL (ref 150–400)
RBC: 3.94 MIL/uL (ref 3.87–5.11)
RDW: 19.3 % — ABNORMAL HIGH (ref 11.5–15.5)
WBC: 8.3 10*3/uL (ref 4.0–10.5)
nRBC: 0 % (ref 0.0–0.2)

## 2022-01-05 LAB — COMPREHENSIVE METABOLIC PANEL
ALT: 25 U/L (ref 0–44)
AST: 30 U/L (ref 15–41)
Albumin: 3.4 g/dL — ABNORMAL LOW (ref 3.5–5.0)
Alkaline Phosphatase: 75 U/L (ref 38–126)
Anion gap: 13 (ref 5–15)
BUN: 45 mg/dL — ABNORMAL HIGH (ref 8–23)
CO2: 29 mmol/L (ref 22–32)
Calcium: 9.6 mg/dL (ref 8.9–10.3)
Chloride: 93 mmol/L — ABNORMAL LOW (ref 98–111)
Creatinine, Ser: 2.44 mg/dL — ABNORMAL HIGH (ref 0.44–1.00)
GFR, Estimated: 21 mL/min — ABNORMAL LOW (ref >60)
Glucose, Bld: 108 mg/dL — ABNORMAL HIGH (ref 70–99)
Potassium: 3.6 mmol/L (ref 3.5–5.1)
Sodium: 135 mmol/L (ref 135–145)
Total Bilirubin: 0.8 mg/dL (ref 0.3–1.2)
Total Protein: 7.6 g/dL (ref 6.5–8.1)

## 2022-01-05 LAB — URINALYSIS, ROUTINE W REFLEX MICROSCOPIC
Bilirubin Urine: NEGATIVE
Glucose, UA: 100 mg/dL — AB
Hgb urine dipstick: NEGATIVE
Ketones, ur: NEGATIVE mg/dL
Leukocytes,Ua: NEGATIVE
Nitrite: NEGATIVE
Protein, ur: NEGATIVE mg/dL
Specific Gravity, Urine: <1.005 — ABNORMAL LOW (ref 1.005–1.030)
pH: 6 (ref 5.0–8.0)

## 2022-01-05 LAB — SURGICAL PCR SCREEN
MRSA, PCR: NEGATIVE
Staphylococcus aureus: NEGATIVE

## 2022-01-05 LAB — TYPE AND SCREEN
ABO/RH(D): B POS
Antibody Screen: NEGATIVE

## 2022-01-05 LAB — HEMOGLOBIN A1C
Hgb A1c MFr Bld: 6.6 % — ABNORMAL HIGH (ref 4.8–5.6)
Mean Plasma Glucose: 142.72 mg/dL

## 2022-01-05 LAB — PROTIME-INR
INR: 1.5 — ABNORMAL HIGH (ref 0.8–1.2)
Prothrombin Time: 18.2 seconds — ABNORMAL HIGH (ref 11.4–15.2)

## 2022-01-05 LAB — GLUCOSE, CAPILLARY: Glucose-Capillary: 146 mg/dL — ABNORMAL HIGH (ref 70–99)

## 2022-01-05 NOTE — Pre-Procedure Instructions (Signed)
Surgical Instructions    Your procedure is scheduled on January 08, 2022.  Report to [facility] Main Entrance "A" at 8:45 A.M., then check in with the Admitting office.  Call this number if you have problems the morning of surgery:  [phone]   If you have any questions prior to your surgery date call [phone]: Open Monday-Friday 8am-4pm    Remember:  Do not eat or drink after midnight the night before your surgery     The morning of your procedure, DO NOT TAKE Farxiga, insulin glargine (Toujeo), hydralazine, torsemide, losartan, and spironolactone.      Take these medicines the morning of surgery with A SIP OF WATER:  ezetimibe (ZETIA)  gabapentin (NEURONTIN)  isosorbide mononitrate (IMDUR)  pantoprazole (PROTONIX)   rosuvastatin (CRESTOR)  senna-docusate (SENOKOT-S)  letrozole [facility])   Take these medicines the morning of surgery AS NEEDED:  acetaminophen (TYLENOL)    albuterol (PROVENTIL HFA;VENTOLIN HFA)  fexofenadine (ALLEGRA)   LORazepam (ATIVAN)  ondansetron (ZOFRAN)   Oxycodone   nitroGLYCERIN (NITROSTAT)    STOP taking ELIQUIS on 8/22. Your last dose of this medication will be the evening of 8/21.     As of today, STOP taking any Aspirin (unless otherwise instructed by your surgeon) Aleve, Naproxen, Ibuprofen, Motrin, Advil, Goody's, BC's, all herbal medications, fish oil, and all vitamins.           WHAT DO I DO ABOUT MY DIABETES MEDICATION?   STOP taking FARXIGA today, August 21st.  THE MORNING BEFORE SURGERY (8/23) - Take usual dose of TOUJEO, 30 units  THE NIGHT BEFORE SURGERY (8/23) - Take half of usual dose of TOUJEO, 15 units     THE MORNING OF SURGERY (8/24) -  DO NOT TAKE ANY TOUJEO   HOW TO MANAGE YOUR DIABETES BEFORE AND AFTER SURGERY  Why is it important to control my blood sugar before and after surgery? Improving blood sugar levels before and after surgery helps healing and can limit problems. A way of improving blood sugar  control is eating a healthy diet by:  Eating less sugar and carbohydrates  Increasing activity/exercise  Talking with your doctor about reaching your blood sugar goals High blood sugars (greater than 180 mg/dL) can raise your risk of infections and slow your recovery, so you will need to focus on controlling your diabetes during the weeks before surgery. Make sure that the doctor who takes care of your diabetes knows about your planned surgery including the date and location.  How do I manage my blood sugar before surgery? Check your blood sugar at least 4 times a day, starting 2 days before surgery, to make sure that the level is not too high or low.  Check your blood sugar the morning of your surgery when you wake up and every 2 hours until you get to the Short Stay unit.  If your blood sugar is less than 70 mg/dL, you will need to treat for low blood sugar: Do not take insulin. Treat a low blood sugar (less than 70 mg/dL) with  cup of clear juice (cranberry or apple), 4 glucose tablets, OR glucose gel. Recheck blood sugar in 15 minutes after treatment (to make sure it is greater than 70 mg/dL). If your blood sugar is not greater than 70 mg/dL on recheck, call [phone] for further instructions. Report your blood sugar to the short stay nurse when you get to Short Stay.  If you are admitted to the hospital after surgery: Your blood sugar  will be checked by the staff and you will probably be given insulin after surgery (instead of oral diabetes medicines) to make sure you have good blood sugar levels. The goal for blood sugar control after surgery is 80-180 mg/dL.             Do NOT Smoke (Tobacco/Vaping) for 24 hours prior to your procedure.  If you use a CPAP at night, you may bring your mask/headgear for your overnight stay.   Contacts, glasses, piercing's, hearing aid's, dentures or partials may not be worn into surgery, please bring cases for these belongings.    For patients  admitted to the hospital, discharge time will be determined by your treatment team.   Patients discharged the day of surgery will not be allowed to drive home, and someone needs to stay with them for 24 hours.  SURGICAL WAITING ROOM VISITATION Patients having surgery or a procedure may have no more than 2 support people in the waiting area - these visitors may rotate.   Children under the age of 51 must have an adult with them who is not the patient. If the patient needs to stay at the hospital during part of their recovery, the visitor guidelines for inpatient rooms apply. Pre-op nurse will coordinate an appropriate time for 1 support person to accompany patient in pre-op.  This support person may not rotate.   Please refer to the [facility] website for the visitor guidelines for Inpatients (after your surgery is over and you are in a regular room).    Special instructions:   [place]- Preparing For Surgery  Before surgery, you can play an important role. Because skin is not sterile, your skin needs to be as free of germs as possible. You can reduce the number of germs on your skin by washing with CHG (chlorahexidine gluconate) Soap before surgery.  CHG is an antiseptic cleaner which kills germs and bonds with the skin to continue killing germs even after washing.    Oral Hygiene is also important to reduce your risk of infection.  Remember - BRUSH YOUR TEETH THE MORNING OF SURGERY WITH YOUR REGULAR TOOTHPASTE  Please do not use if you have an allergy to CHG or antibacterial soaps. If your skin becomes reddened/irritated stop using the CHG.  Do not shave (including legs and underarms) for at least 48 hours prior to first CHG shower. It is OK to shave your face.  Please follow these instructions carefully.   Shower the NIGHT BEFORE SURGERY and the MORNING OF SURGERY  If you chose to wash your hair, wash your hair first as usual with your normal shampoo.  After you shampoo, rinse your  hair and body thoroughly to remove the shampoo.  Use CHG Soap as you would any other liquid soap. You can apply CHG directly to the skin and wash gently with a scrungie or a clean washcloth.   Apply the CHG Soap to your body ONLY FROM THE NECK DOWN.  Do not use on open wounds or open sores. Avoid contact with your eyes, ears, mouth and genitals (private parts). Wash Face and genitals (private parts)  with your normal soap.   Wash thoroughly, paying special attention to the area where your surgery will be performed.  Thoroughly rinse your body with warm water from the neck down.  DO NOT shower/wash with your normal soap after using and rinsing off the CHG Soap.  Pat yourself dry with a CLEAN TOWEL.  Wear CLEAN PAJAMAS to  bed the night before surgery  Place CLEAN SHEETS on your bed the night before your surgery  DO NOT SLEEP WITH PETS.   Day of Surgery: Take a shower with CHG soap. Do not wear jewelry or makeup Do not wear lotions, powders, perfumes/colognes, or deodorant. Do not shave 48 hours prior to surgery.   Do not bring valuables to the hospital.  [facility] is not responsible for any belongings or valuables. Do not wear nail polish, gel polish, artificial nails, or any other type of covering on natural nails (fingers and toes) If you have artificial nails or gel coating that need to be removed by a nail salon, please have this removed prior to surgery. Artificial nails or gel coating may interfere with anesthesia's ability to adequately monitor your vital signs.  Wear Clean/Comfortable clothing the morning of surgery Remember to brush your teeth WITH YOUR REGULAR TOOTHPASTE.   Please read over the following fact sheets that you were given.    If you received a COVID test during your pre-op visit  it is requested that you wear a mask when out in public, stay away from anyone that may not be feeling well and notify your surgeon if you develop symptoms. If you have been in  contact with anyone that has tested positive in the last 10 days please notify you surgeon.

## 2022-01-05 NOTE — Pre-Procedure Instructions (Signed)
Surgical Instructions    Your procedure is scheduled on January 08, 2022.  Report to [facility] Main Entrance "A" at 8:45 A.M., then check in with the Admitting office.  Call this number if you have problems the morning of surgery:  [phone]   If you have any questions prior to your surgery date call [phone]: Open Monday-Friday 8am-4pm    Remember:  Do not eat or drink after midnight the night before your surgery     The morning of your procedure, DO NOT TAKE Farxiga, insulin glargine (Toujeo), hydralazine, torsemide, losartan, and spironolactone.      Take these medicines the morning of surgery with A SIP OF WATER:  ezetimibe (ZETIA)  gabapentin (NEURONTIN)  isosorbide mononitrate (IMDUR)  pantoprazole (PROTONIX)   rosuvastatin (CRESTOR)  senna-docusate (SENOKOT-S)  letrozole [facility])   Take these medicines the morning of surgery AS NEEDED:  acetaminophen (TYLENOL)    albuterol (PROVENTIL HFA;VENTOLIN HFA)  fexofenadine (ALLEGRA)   LORazepam (ATIVAN)  ondansetron (ZOFRAN)   Oxycodone   nitroGLYCERIN (NITROSTAT)    STOP taking ELIQUIS on 8/22. Your last dose of this medication will be the evening of 8/21.     As of today, STOP taking any Aspirin (unless otherwise instructed by your surgeon) Aleve, Naproxen, Ibuprofen, Motrin, Advil, Goody's, BC's, all herbal medications, fish oil, and all vitamins.           WHAT DO I DO ABOUT MY DIABETES MEDICATION?   STOP taking FARXIGA today, August 21st.  THE NIGHT BEFORE SURGERY, take 50% of your TOUJEO, which will be 15 units.      THE MORNING OF SURGERY, Do not take any TOUJEO.   HOW TO MANAGE YOUR DIABETES BEFORE AND AFTER SURGERY  Why is it important to control my blood sugar before and after surgery? Improving blood sugar levels before and after surgery helps healing and can limit problems. A way of improving blood sugar control is eating a healthy diet by:  Eating less sugar and carbohydrates   Increasing activity/exercise  Talking with your doctor about reaching your blood sugar goals High blood sugars (greater than 180 mg/dL) can raise your risk of infections and slow your recovery, so you will need to focus on controlling your diabetes during the weeks before surgery. Make sure that the doctor who takes care of your diabetes knows about your planned surgery including the date and location.  How do I manage my blood sugar before surgery? Check your blood sugar at least 4 times a day, starting 2 days before surgery, to make sure that the level is not too high or low.  Check your blood sugar the morning of your surgery when you wake up and every 2 hours until you get to the Short Stay unit.  If your blood sugar is less than 70 mg/dL, you will need to treat for low blood sugar: Do not take insulin. Treat a low blood sugar (less than 70 mg/dL) with  cup of clear juice (cranberry or apple), 4 glucose tablets, OR glucose gel. Recheck blood sugar in 15 minutes after treatment (to make sure it is greater than 70 mg/dL). If your blood sugar is not greater than 70 mg/dL on recheck, call [phone] for further instructions. Report your blood sugar to the short stay nurse when you get to Short Stay.  If you are admitted to the hospital after surgery: Your blood sugar will be checked by the staff and you will probably be given insulin after surgery (instead of  oral diabetes medicines) to make sure you have good blood sugar levels. The goal for blood sugar control after surgery is 80-180 mg/dL.             Do NOT Smoke (Tobacco/Vaping) for 24 hours prior to your procedure.  If you use a CPAP at night, you may bring your mask/headgear for your overnight stay.   Contacts, glasses, piercing's, hearing aid's, dentures or partials may not be worn into surgery, please bring cases for these belongings.    For patients admitted to the hospital, discharge time will be determined by your treatment  team.   Patients discharged the day of surgery will not be allowed to drive home, and someone needs to stay with them for 24 hours.  SURGICAL WAITING ROOM VISITATION Patients having surgery or a procedure may have no more than 2 support people in the waiting area - these visitors may rotate.   Children under the age of 68 must have an adult with them who is not the patient. If the patient needs to stay at the hospital during part of their recovery, the visitor guidelines for inpatient rooms apply. Pre-op nurse will coordinate an appropriate time for 1 support person to accompany patient in pre-op.  This support person may not rotate.   Please refer to the [facility] website for the visitor guidelines for Inpatients (after your surgery is over and you are in a regular room).    Special instructions:   [place]- Preparing For Surgery  Before surgery, you can play an important role. Because skin is not sterile, your skin needs to be as free of germs as possible. You can reduce the number of germs on your skin by washing with CHG (chlorahexidine gluconate) Soap before surgery.  CHG is an antiseptic cleaner which kills germs and bonds with the skin to continue killing germs even after washing.    Oral Hygiene is also important to reduce your risk of infection.  Remember - BRUSH YOUR TEETH THE MORNING OF SURGERY WITH YOUR REGULAR TOOTHPASTE  Please do not use if you have an allergy to CHG or antibacterial soaps. If your skin becomes reddened/irritated stop using the CHG.  Do not shave (including legs and underarms) for at least 48 hours prior to first CHG shower. It is OK to shave your face.  Please follow these instructions carefully.   Shower the NIGHT BEFORE SURGERY and the MORNING OF SURGERY  If you chose to wash your hair, wash your hair first as usual with your normal shampoo.  After you shampoo, rinse your hair and body thoroughly to remove the shampoo.  Use CHG Soap as you would  any other liquid soap. You can apply CHG directly to the skin and wash gently with a scrungie or a clean washcloth.   Apply the CHG Soap to your body ONLY FROM THE NECK DOWN.  Do not use on open wounds or open sores. Avoid contact with your eyes, ears, mouth and genitals (private parts). Wash Face and genitals (private parts)  with your normal soap.   Wash thoroughly, paying special attention to the area where your surgery will be performed.  Thoroughly rinse your body with warm water from the neck down.  DO NOT shower/wash with your normal soap after using and rinsing off the CHG Soap.  Pat yourself dry with a CLEAN TOWEL.  Wear CLEAN PAJAMAS to bed the night before surgery  Place CLEAN SHEETS on your bed the night before your surgery  DO NOT SLEEP WITH PETS.   Day of Surgery: Take a shower with CHG soap. Do not wear jewelry or makeup Do not wear lotions, powders, perfumes, or deodorant. Do not shave 48 hours prior to surgery.   Do not bring valuables to the hospital.  [facility] is not responsible for any belongings or valuables. Do not wear nail polish, gel polish, artificial nails, or any other type of covering on natural nails (fingers and toes) If you have artificial nails or gel coating that need to be removed by a nail salon, please have this removed prior to surgery. Artificial nails or gel coating may interfere with anesthesia's ability to adequately monitor your vital signs.  Wear Clean/Comfortable clothing the morning of surgery Remember to brush your teeth WITH YOUR REGULAR TOOTHPASTE.   Please read over the following fact sheets that you were given.    If you received a COVID test during your pre-op visit  it is requested that you wear a mask when out in public, stay away from anyone that may not be feeling well and notify your surgeon if you develop symptoms. If you have been in contact with anyone that has tested positive in the last 10 days please notify you  surgeon.

## 2022-01-05 NOTE — Progress Notes (Addendum)
PCP - Chana Bode, PA-C Cardiologist - Dr. Ellyn Hack / Dr. Aundra Dubin  PPM/ICD - Denies Device Orders - n/a Rep Notified - n/a  Chest x-ray - 01/05/2022 EKG - 01/05/2022 Stress Test - 10/26/2019 ECHO - 11/25/2021 Cardiac Cath - 11/25/2021  Sleep Study - Pt had positive sleep study years ago and then had a follow-up one 06/03/2021 and was negative for sleep apnea CPAP - When she was positive she wore CPAP every night. Weaned herself off of it since her negative study  Per pt, DM2. She checks her sugar 3x per day. Fasting CBG 70-168 (depending on if she has a snack overnight) CBG at PAT visit 146. She had a fruit cup and 1/2 cup of jello prior to visit.   Blood Thinner Instructions: Pt last dose of Eliquis is evening of August 21st. Aspirin Instructions: n/a  NPO after midnight  COVID TEST- Yes. Result Pending   Anesthesia review: Yes. Cardiac Hx. Abnormal EKG. Discussed with Karoline Caldwell, PA-C. Abnormal PT result and creatinine result.   Patient denies shortness of breath, fever, cough and chest pain at PAT appointment   All instructions explained to the patient, with a verbal understanding of the material. Patient agrees to go over the instructions while at home for a better understanding. Patient also instructed to self quarantine after being tested for COVID-19. The opportunity to ask questions was provided.

## 2022-01-05 NOTE — Pre-Procedure Instructions (Signed)
Surgical Instructions    Your procedure is scheduled on January 08, 2022.  Report to [facility] Main Entrance "A" at 8:45 A.M., then check in with the Admitting office.  Call this number if you have problems the morning of surgery:  [phone]   If you have any questions prior to your surgery date call [phone]: Open Monday-Friday 8am-4pm    Remember:  Do not eat or drink after midnight the night before your surgery     The morning of your procedure, DO NOT TAKE Farxiga, insulin glargine (Toujeo), hydralazine, torsemide, losartan, and spironolactone.      Take these medicines the morning of surgery with A SIP OF WATER:  ezetimibe (ZETIA)  gabapentin (NEURONTIN)  isosorbide mononitrate (IMDUR)  pantoprazole (PROTONIX)   rosuvastatin (CRESTOR)  senna-docusate (SENOKOT-S)  letrozole [facility])   Take these medicines the morning of surgery AS NEEDED:  acetaminophen (TYLENOL)    albuterol (PROVENTIL HFA;VENTOLIN HFA)  fexofenadine (ALLEGRA)   LORazepam (ATIVAN)  ondansetron (ZOFRAN)   Oxycodone   nitroGLYCERIN (NITROSTAT)    STOP taking ELIQUIS on 8/22. Your last dose of this medication will be the evening of 8/21.     As of today, STOP taking any Aspirin (unless otherwise instructed by your surgeon) Aleve, Naproxen, Ibuprofen, Motrin, Advil, Goody's, BC's, all herbal medications, fish oil, and all vitamins.           WHAT DO I DO ABOUT MY DIABETES MEDICATION?   STOP taking FARXIGA starting today, August 21st.  THE MORNING BEFORE SURGERY (8/23) - take usual dose of TOUJEO, which is 30 units  THE NIGHT BEFORE SURGERY (8/23) - take half of usual dose of TOUJEO, which will be 15 units       THE MORNING OF SURGERY (8/24), - DO NOT TAKE ANY TOUJEO     HOW TO MANAGE YOUR DIABETES BEFORE AND AFTER SURGERY  Why is it important to control my blood sugar before and after surgery? Improving blood sugar levels before and after surgery helps healing and can limit  problems. A way of improving blood sugar control is eating a healthy diet by:  Eating less sugar and carbohydrates  Increasing activity/exercise  Talking with your doctor about reaching your blood sugar goals High blood sugars (greater than 180 mg/dL) can raise your risk of infections and slow your recovery, so you will need to focus on controlling your diabetes during the weeks before surgery. Make sure that the doctor who takes care of your diabetes knows about your planned surgery including the date and location.  How do I manage my blood sugar before surgery? Check your blood sugar at least 4 times a day, starting 2 days before surgery, to make sure that the level is not too high or low.  Check your blood sugar the morning of your surgery when you wake up and every 2 hours until you get to the Short Stay unit.  If your blood sugar is less than 70 mg/dL, you will need to treat for low blood sugar: Do not take insulin. Treat a low blood sugar (less than 70 mg/dL) with  cup of clear juice (cranberry or apple), 4 glucose tablets, OR glucose gel. Recheck blood sugar in 15 minutes after treatment (to make sure it is greater than 70 mg/dL). If your blood sugar is not greater than 70 mg/dL on recheck, call [phone] for further instructions. Report your blood sugar to the short stay nurse when you get to Short Stay.  If you are  admitted to the hospital after surgery: Your blood sugar will be checked by the staff and you will probably be given insulin after surgery (instead of oral diabetes medicines) to make sure you have good blood sugar levels. The goal for blood sugar control after surgery is 80-180 mg/dL.             Do NOT Smoke (Tobacco/Vaping) for 24 hours prior to your procedure.  If you use a CPAP at night, you may bring your mask/headgear for your overnight stay.   Contacts, glasses, piercing's, hearing aid's, dentures or partials may not be worn into surgery, please bring cases  for these belongings.    For patients admitted to the hospital, discharge time will be determined by your treatment team.   Patients discharged the day of surgery will not be allowed to drive home, and someone needs to stay with them for 24 hours.  SURGICAL WAITING ROOM VISITATION Patients having surgery or a procedure may have no more than 2 support people in the waiting area - these visitors may rotate.   Children under the age of 42 must have an adult with them who is not the patient. If the patient needs to stay at the hospital during part of their recovery, the visitor guidelines for inpatient rooms apply. Pre-op nurse will coordinate an appropriate time for 1 support person to accompany patient in pre-op.  This support person may not rotate.   Please refer to the [facility] website for the visitor guidelines for Inpatients (after your surgery is over and you are in a regular room).    Special instructions:   [place]- Preparing For Surgery  Before surgery, you can play an important role. Because skin is not sterile, your skin needs to be as free of germs as possible. You can reduce the number of germs on your skin by washing with CHG (chlorahexidine gluconate) Soap before surgery.  CHG is an antiseptic cleaner which kills germs and bonds with the skin to continue killing germs even after washing.    Oral Hygiene is also important to reduce your risk of infection.  Remember - BRUSH YOUR TEETH THE MORNING OF SURGERY WITH YOUR REGULAR TOOTHPASTE  Please do not use if you have an allergy to CHG or antibacterial soaps. If your skin becomes reddened/irritated stop using the CHG.  Do not shave (including legs and underarms) for at least 48 hours prior to first CHG shower. It is OK to shave your face.  Please follow these instructions carefully.   Shower the NIGHT BEFORE SURGERY and the MORNING OF SURGERY  If you chose to wash your hair, wash your hair first as usual with your normal  shampoo.  After you shampoo, rinse your hair and body thoroughly to remove the shampoo.  Use CHG Soap as you would any other liquid soap. You can apply CHG directly to the skin and wash gently with a scrungie or a clean washcloth.   Apply the CHG Soap to your body ONLY FROM THE NECK DOWN.  Do not use on open wounds or open sores. Avoid contact with your eyes, ears, mouth and genitals (private parts). Wash Face and genitals (private parts)  with your normal soap.   Wash thoroughly, paying special attention to the area where your surgery will be performed.  Thoroughly rinse your body with warm water from the neck down.  DO NOT shower/wash with your normal soap after using and rinsing off the CHG Soap.  Pat yourself dry  with a CLEAN TOWEL.  Wear CLEAN PAJAMAS to bed the night before surgery  Place CLEAN SHEETS on your bed the night before your surgery  DO NOT SLEEP WITH PETS.   Day of Surgery: Take a shower with CHG soap. Do not wear jewelry or makeup Do not wear lotions, powders, perfumes/colognes, or deodorant. Do not shave 48 hours prior to surgery.   Do not bring valuables to the hospital.  [facility] is not responsible for any belongings or valuables. Do not wear nail polish, gel polish, artificial nails, or any other type of covering on natural nails (fingers and toes) If you have artificial nails or gel coating that need to be removed by a nail salon, please have this removed prior to surgery. Artificial nails or gel coating may interfere with anesthesia's ability to adequately monitor your vital signs.  Wear Clean/Comfortable clothing the morning of surgery Remember to brush your teeth WITH YOUR REGULAR TOOTHPASTE.   Please read over the following fact sheets that you were given.    If you received a COVID test during your pre-op visit  it is requested that you wear a mask when out in public, stay away from anyone that may not be feeling well and notify your surgeon if  you develop symptoms. If you have been in contact with anyone that has tested positive in the last 10 days please notify you surgeon.

## 2022-01-06 LAB — SARS CORONAVIRUS 2 (TAT 6-24 HRS): SARS Coronavirus 2: NEGATIVE

## 2022-01-06 NOTE — Progress Notes (Addendum)
Anesthesia Chart Review:  Case: [id number] Date/Time: 01/09/2022 1030   Procedures:      MITRAL VALVE REPAIR     TRANSESOPHAGEAL ECHOCARDIOGRAM (TEE)   Anesthesia type: General   Diagnosis: Mitral valve insufficiency, unspecified etiology [I34.0]   Pre-op diagnosis: severe mitral insufficiency   Location: [facility] CATH LAB 6 / [place] INVASIVE CV LAB   Providers: Early Osmond, MD       DISCUSSION: Patient is a 71 year old female scheduled for the above procedure.  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  Sleep study negative.  V/Q scan not suggestive of chronic PE. CT chest did not show interstitial lung disease. 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 Previously on CPAP for OSA (diagnosed ~ 2012)--she did not meet split night protocol due to not enough sleep and respiratory events on 05/28/21 study.  At PAT reported last Eliquis 01/05/22.   01/05/22 COVID-19 test was negative. Anesthesia team to evaluate on the day of surgery.   VS: BP (!) 118/101   Pulse 66   Resp 18   Ht '5\' 3"'$  (1.6 m)   Wt 78.4 kg   SpO2 97%   BMI 30.63 kg/m  (HR up to 106 bpm with EKG)   PROVIDERS: Tysinger, Camelia Eng, PA-C is PCP  Glenetta Hew, MD is primary cardiologist Loralie Champagne, MD is HF cardiologist Lenna Sciara, MD is structural heart cardiologist Ladell Pier,  MD is HEM-ONC Corliss Parish, MD is nephrologist   LABS: Preoperative labs noted. Cr 2.44, previously ~ 1.8-2.2 since June 2023. PT 18.2, INR 1.5. Labs reviewed with Nell Range, PA-C with structural heart team, "Pre clip labs reviewed. Her creat/BUN is noted to be a little more elevated that previous baseline but had been instructed by advanced CHF clinic to take metolazone on Mondays. Will continue meds as they are and closely follow during upcoming admission. Will CC [employer]." (all labs ordered are listed, but only abnormal results are displayed)  Labs Reviewed  GLUCOSE, CAPILLARY - Abnormal; Notable for the following components:      Result Value   Glucose-Capillary 146 (*)    All other components within normal limits  CBC - Abnormal; Notable for the following components:   Hemoglobin 10.3 (*)    HCT 34.3 (*)    RDW 19.3 (*)    All other components within normal limits  COMPREHENSIVE METABOLIC PANEL - Abnormal; Notable for the following components:   Chloride 93 (*)    Glucose, Bld 108 (*)    BUN 45 (*)    Creatinine, Ser 2.44 (*)    Albumin 3.4 (*)    GFR, Estimated 21 (*)    All other components within normal limits  PROTIME-INR - Abnormal; Notable for the following components:   Prothrombin Time 18.2 (*)    INR 1.5 (*)    All other components within normal limits  URINALYSIS, ROUTINE W REFLEX MICROSCOPIC - Abnormal; Notable for the following components:   Specific Gravity, Urine <1.005 (*)  Glucose, UA 100 (*)    All other components within normal limits  HEMOGLOBIN A1C - Abnormal; Notable for the following components:   Hgb A1c MFr Bld 6.6 (*)    All other components within normal limits  SURGICAL PCR SCREEN  SARS CORONAVIRUS 2 (TAT 6-24 HRS)  TYPE AND SCREEN     IMAGES: CXR 01/05/22: IMPRESSION: 1. Stable marked cardiomegaly. Central pulmonary vascular congestion, likely chronic. No overt alveolar pulmonary edema. No evidence of pneumonia. 2.  Lytic-appearing osseous lesion within the RIGHT clavicle, likely related to the previously supposed multifocal osseous metastatic disease described on chest CT report of 09/22/2021 and confirmed in the pelvis by CT-guided biopsy on 09/25/2021.   CT Chest/abd/pelvis 09/22/21: IMPRESSION: 1. Diffuse lytic and sclerotic lesions in the bones, compatible with metastatic disease. There is a mild compression deformity in the inferior endplate of Y07 which is indeterminate in age. The possibility of pathologic fracture can not be completely excluded. 2. Indeterminate hypodensity in the posterior right lobe of the liver measuring 1.2 cm. Multiphase CT or MRI is suggested for further evaluation. 3. Cholelithiasis. 4. Diverticulosis without diverticulitis. 5. Gastric wall thickening of the proximal stomach, which may be in part due to underdistention. The possibility gastritis or metastatic disease can not be completely excluded. Upper GI may be beneficial for further evaluation. 6. Uterine fibroids. 7. Aortic atherosclerosis. 8. Cardiomegaly with multi-vessel coronary artery calcifications. (Left ilium CT guided biopsy 09/25/21: Metastatic poorly differentiated adenocarcinoma consistent with breast primary)     EKG: Ventricular rate 106 bpm Atrial fibrillation with rapid ventricular response with premature ventricular or aberrantly conducted complexes Left axis deviation Left ventricular hypertrophy with QRS widening and repolarization abnormality ( R in aVL , Cornell product ) Possible Lateral infarct , age undetermined Abnormal ECG When compared with ECG of 12-Nov-2021 14:39, No significant change was found Confirmed by End, Harrell Gave [phone]) on 01/05/2022 7:38:41 PM   CV: RHC 10/22/21: Hemodynamics (mmHg) RA mean 16 RV 79/10 PA 86/41, mean 55 PCWP mean 27  Oxygen saturations: PA 51% AO 97%  Cardiac Output (Fick) 4.31  Cardiac Index (Fick) 2.36 PVR 6.5 WU   PAPI 2.8  1.  Elevated right and left heart filling pressures.  2. Severe mixed pulmonary venous/pulmonary arterial hypertension.  3. Relatively preserved cardiac index.    TEE 10/22/21: IMPRESSIONS   1. Left ventricular ejection fraction, by estimation, is 30 to 35%. The  left ventricle has moderately decreased function. The left ventricle  demonstrates global hypokinesis. The left ventricular internal cavity size  was mildly dilated.   2. Right ventricular systolic function is moderately reduced. The right  ventricular size is mildly enlarged.   3. Left atrial size was moderately dilated. No left atrial/left atrial  appendage thrombus was detected.   4. Right atrial size was moderately dilated.   5. No PFO/ASD by color doppler.   6. Severe mitral regurgitation. There is restriction of posterior leaflet  in setting of dilation of the LA and the LV. PISA ERO 0.39 cm^2. There is  flattening but not reversal of the pulmonary vein PW doppler systolic  signal. No mitral stenosis.   7. The tricuspid valve is abnormal. Tricuspid valve regurgitation is  moderate.   8. The aortic valve is tricuspid. Aortic valve regurgitation is not  visualized. No aortic stenosis is present.   9. Normal caliber aorta with moderate (grade 3 plaque).    TTE 10/07/21: IMPRESSIONS   1. Left ventricular ejection fraction, by estimation, is 30 to 35%.  The  left ventricle has moderately decreased function. The left ventricle  demonstrates global hypokinesis. Left ventricular diastolic parameters are  indeterminate.   2. Right ventricular systolic function is mildly reduced. The right  ventricular size is mildly enlarged. There is severely elevated pulmonary  artery systolic pressure. The estimated right ventricular systolic  pressure is 57.8 mmHg.   3. Left atrial size was moderately dilated.   4. Right atrial size was mildly dilated.   5. Restriction of posterior mitral leaflet. The mitral valve is abnormal.  Moderate to  severe mitral valve regurgitation. No evidence of mitral  stenosis.   6. The aortic valve is tricuspid. Aortic valve regurgitation is not  visualized. No aortic stenosis is present.   7. Aortic dilatation noted. There is mild dilatation of the ascending  aorta, measuring 38 mm.   8. The inferior vena cava is dilated in size with <50% respiratory  variability, suggesting right atrial pressure of 15 mmHg.    cMRI 06/26/21: IMPRESSION: 1.  Normal LV size with diffuse hypokinesis, EF 36%. 2.  Normal RV size with moderately decreased RV function, EF 32%. 3.  Moderate mitral regurgitation, regurgitant fraction 21%. 4. Myocardial LGE in septum is in a noncoronary pattern, ?myocarditis. T1 indices and ECV elevated in the septum as well though T2 not significantly elevated. Myocardial LGE in the anterior wall is subendocardial, may be due to prior MI.   Cardiac cath (RHC/LHC) 06/23/21:   Prox RCA to Mid RCA lesion is 60% stenosed.   Dist Cx lesion is 90% stenosed.   Previously placed Mid LAD stent (unknown type) is  widely patent.  HEMODYNAMICS:  1: Right atrial pressure-22/21 2: Right ventricular pressure-70/1 3: Pulmonary artery pressure-80/30 4: Cardiac output-3.55 L/min with an index of 1.89 L/min/m 5: LVEDP-14   IMPRESSION:  Ms. Milks has a patent LAD stent, severe disease in the distal circumflex before 3 small posterolateral branches and moderate segmental calcified mid dominant RCA stenosis.  I do not think these explain her LV dysfunction.  I reviewed the angiograms with Dr. Irish Lack, her attending cardiologist, and we both feel that she would benefit from medication optimization by the heart failure service.  She does not need coronary intervention.   Past Medical History:  Diagnosis Date   Anginal pain ([place])    Anxiety    Aortic atherosclerosis (HCC)    Arthritis    Asthma    Atrial fibrillation, permanent ([place])    Rate control with Bystolic. CHA2DS2Vasc = 6 (HTN, DM,  CHF, Age 49, Female) -> on Pradaxa   Breast cancer ([place]) 2008   S/P mastectomy   CAD S/P percutaneous coronary angioplasty 2006   PCI of circumflex with Taxus DES;; relook-cath Feb 2013: 50-60% short lesion in RCA, 40% ISR Circumflex stent.;  06/2021: Patent LCx stent.  Distal LCx 90% stenosis, proximal-mid RCA 60%.   CHF (congestive heart failure), NYHA class II, chronic, combined ([place])    CKD (chronic kidney disease), stage III (HCC)    Complication of anesthesia    prolonged sedation after colonoscopy in [place]   Diabetes mellitus, uncontrolled 07/14/2011   Dilated cardiomyopathy ([place]) 06/2021   2019: EF down to 25% improved to 50 to 55% x 2020. => Recurrent cardiomyopathy 06/2021 => EF 25 to 30% on Echo, CMR showed severe BiV failure (LVEF 36%, diffuse HK; RVEF 32%).  Severe biatrial enlargement.  Moderate MR posterior directed.  Suspect functional (suggested by TEE); noncoronary pattern for delayed gadolinium-suggest myocarditis, & prior Ant MI-subendocardial =>  Mixed cardiomyopathy   Dysrhythmia    a. fib   Edema of both legs    Usually mild, chronic. Controlled with when necessary furosemide and diet   Fibroid, uterine 07/13/2011   "have that now"   GERD (gastroesophageal reflux disease)    Gout    Hyperlipidemia    Hypertension    Hypokalemia    Morbid obesity ([place])    BMI 41   OSA on CPAP    On CPAP   Pneumonia 2018   Pulmonary hypertension, unspecified ([place])    PAP ~90 mmHg on Echo 12/2017 -- has OSA on CPAP & obesity -> follow-up sleep study showed mild OSA.;  09/22/2021: RHC showed PAP of 86/30 mmHg with LVEDP of 14 mmHg.   Systolic murmur     Past Surgical History:  Procedure Laterality Date   Cardiac MRI  06/24/2021   Severe biventricular failure: LVEF 36%-diffuse HK. RV EF 32%. Severe BiA Enlargement. Mod MR. LGE in septum - non-coronary pattern -? Myocarditis. Anterior wall LGE is subendocardial - ? Prior MI.   COLONOSCOPY     CORONARY ANGIOPLASTY WITH STENT PLACEMENT   2006   stent to circumflex   DILATION AND CURETTAGE OF UTERUS     LEFT HEART CATHETERIZATION WITH CORONARY ANGIOGRAM N/A 07/13/2011   Procedure: LEFT HEART CATHETERIZATION WITH CORONARY ANGIOGRAM;  Surgeon: Leonie Man, MD;  Location: [facility] CATH LAB;  Service: Cardiovascular;  normal EF; 65m 50-60% lesion in RCA; patent stent in circumflex form 2006 with ~40% in-stent re-stenosis   MASTECTOMY  2001   left   NM MYOVIEW LTD  7/'16; 6/'21   LOW RISK. No ischemia or infarction. Not gated 2/2  A. fib - unable to assess EF   RIGHT HEART CATH N/A 10/22/2021   Procedure: RIGHT HEART CATH;  Surgeon: MLarey Dresser MD;  [facility];  Service: Cardiovascular;  Laterality: N/A;   RIGHT/LEFT HEART CATH AND CORONARY ANGIOGRAPHY N/A 06/23/2021   Procedure: RIGHT/LEFT HEART CATH AND CORONARY ANGIOGRAPHY;  Surgeon: BLorretta Harp MD;  [facility];  Service: Cardiovascular; p-mRCA 60%.  Distal LCx 90%. mLAD stent patent.  RAP 22 RVP 70/1 mmHg with PAP 80/30 mmHg. LVEDP 14 mmHg-unable to assess PCWP.   TEE WITHOUT CARDIOVERSION N/A 06/25/2021   Procedure: TRANSESOPHAGEAL ECHOCARDIOGRAM (TEE);  Surgeon: MLarey Dresser MD;  [facility]; EF 35 to 40% with global HK.  Mildly reduced RV function.  Severely dilated left atrium with no LAA thrombus.  ?  Mild RA dilation.  Posterior MVL restriction w/ moderate eccentric/posterior directed MR.  No PV reversal. ~ functional MR 2/2 Afib.  Mild dilation of the AscAo (~ 40 mm)   TEE WITHOUT CARDIOVERSION N/A 10/22/2021   Procedure: TRANSESOPHAGEAL ECHOCARDIOGRAM (TEE);  Surgeon: MLarey Dresser MD;  [facility];  Service: Cardiovascular;  Laterality: N/A;   TOTAL HIP ARTHROPLASTY Right 01/19/2018   Procedure: RIGHT TOTAL HIP ARTHROPLASTY ANTERIOR APPROACH;  Surgeon: SRod Can MD;  Location: [facility] ORS;  Service: Orthopedics;  Laterality: Right;  Needs RNFA   TRANSTHORACIC ECHOCARDIOGRAM  6/'15; 9/'16   a)  In setting of Urosepsis: EF ~25 %, global HK (poor quality study);; b) LV EF 40-45%, Mild Anteroseptal HK. Mod-Severe  RA dilation with elevated PA Pressures (~~peak 63 mmHg).. Mild LA dilation   TRANSTHORACIC ECHOCARDIOGRAM  12/2017; 04/15/2018   a) Improved LVEF to 45 to 50%.  Pulmonary HTN ~PAP ~ 60 mmHg. Severe LA dilation& mild /rv dilation.;  b) Mod LVH. EF 40-45% (back to prior EF). Unable to assess DF (Afib). Severe R&L Atrial enlargement.  Moderate Post-Lateral directed MR with Mod RV dilation.  PAP ~66 mmHg.;    TRANSTHORACIC ECHOCARDIOGRAM  01/2019   relook Echo: EF back up to 50 to 55%.  Indeterminate diastolic parameters.  Moderately dilated left atrium and right atrium.  Moderate MR.  Mild to moderate TR.   TRANSTHORACIC ECHOCARDIOGRAM  06/21/2021   EF 25 to 30%.  Severely reduced function.  Paradoxical septal motion consistent with LBBB.  Unable to assess diastolic pressures.  Severe biatrial dilation.  Mildly elevated PA P estimated 43.5 mmHg-with RAP estimated at 8 mmHg..  Moderate posteriorly directed MR.    MEDICATIONS:  acetaminophen (TYLENOL) 500 MG tablet   albuterol (PROVENTIL HFA;VENTOLIN HFA) 108 (90 Base) MCG/ACT inhaler   allopurinol (ZYLOPRIM) 300 MG tablet   Biotin 1000 MCG tablet   ELIQUIS 5 MG TABS tablet   ezetimibe (ZETIA) 10 MG tablet   FARXIGA 10 MG TABS tablet   fexofenadine (ALLEGRA) 180 MG tablet   gabapentin (NEURONTIN) 600 MG tablet   glucose blood test strip   hydrALAZINE (APRESOLINE) 25 MG tablet   Hydrocortisone, Perianal, 1 % CREA   isosorbide mononitrate (IMDUR) 60 MG 24 hr tablet   letrozole (FEMARA) 2.5 MG tablet   LORazepam (ATIVAN) 1 MG tablet   metolazone (ZAROXOLYN) 2.5 MG tablet   Multiple Vitamins-Minerals (MULTIVITAMIN WITH MINERALS) tablet   Naphazoline-Glycerin (CLEAR EYES MAX REDNESS RELIEF OP)   nitroGLYCERIN (NITROSTAT) 0.4 MG SL tablet   nystatin (MYCOSTATIN/NYSTOP) powder   ondansetron (ZOFRAN) 4 MG tablet   Oxycodone HCl 10  MG TABS   pantoprazole (PROTONIX) 40 MG tablet   potassium chloride SA (KLOR-CON M) 20 MEQ tablet   rosuvastatin (CRESTOR) 40 MG tablet   senna-docusate (SENOKOT-S) 8.6-50 MG tablet   spironolactone (ALDACTONE) 25 MG tablet   torsemide (DEMADEX) 20 MG tablet   TOUJEO SOLOSTAR 300 UNIT/ML Solostar Pen   triamcinolone ointment (KENALOG) 0.5 %   No current facility-administered medications for this encounter.    Myra Gianotti, PA-C Surgical Short Stay/Anesthesiology [facility] Phone [phone] [facility] Phone [phone] 01/06/2022 2:08 PM

## 2022-01-06 NOTE — Anesthesia Preprocedure Evaluation (Signed)
Anesthesia Evaluation  Patient identified by MRN, date of birth, ID band Patient awake    Reviewed: Allergy & Precautions, H&P , NPO status , Patient's Chart, lab work & pertinent test results  Airway Mallampati: II   Neck ROM: full    Dental   Pulmonary asthma , sleep apnea , former smoker,    breath sounds clear to auscultation       Cardiovascular hypertension, + CAD, + Past MI, + Cardiac Stents and +CHF  + dysrhythmias Atrial Fibrillation + Valvular Problems/Murmurs MR  Rhythm:regular Rate:Normal  TTE (10/22/21): EF 30-35%, severe MR, moderate TR. Restricted posterior mitral valve leaflet.   Neuro/Psych Anxiety    GI/Hepatic GERD  ,  Endo/Other  diabetes, Type 2  Renal/GU Renal InsufficiencyRenal disease     Musculoskeletal  (+) Arthritis ,   Abdominal   Peds  Hematology   Anesthesia Other Findings   Reproductive/Obstetrics                            Anesthesia Physical Anesthesia Plan  ASA: 3  Anesthesia Plan: General   Post-op Pain Management:    Induction: Intravenous  PONV Risk Score and Plan: 3 and Ondansetron, Dexamethasone, Midazolam and Treatment may vary due to age or medical condition  Airway Management Planned: Oral ETT  Additional Equipment: Arterial line, TEE and 3D TEE  Intra-op Plan:   Post-operative Plan: Extubation in OR  Informed Consent: I have reviewed the patients History and Physical, chart, labs and discussed the procedure including the risks, benefits and alternatives for the proposed anesthesia with the patient or authorized representative who has indicated his/her understanding and acceptance.     Dental advisory given  Plan Discussed with: CRNA, Anesthesiologist and Surgeon  Anesthesia Plan Comments: (PAT note written 01/06/2022 by Myra Gianotti, PA-C. )       Anesthesia Quick Evaluation

## 2022-01-07 ENCOUNTER — Ambulatory Visit (HOSPITAL_BASED_OUTPATIENT_CLINIC_OR_DEPARTMENT_OTHER)
Admission: RE | Admit: 2022-01-07 | Discharge: 2022-01-07 | Disposition: A | Discharge status: Home / Self Care | Payer: Medicare Other | Source: Ambulatory Visit | Attending: Oncology | Admitting: Oncology

## 2022-01-07 ENCOUNTER — Inpatient Hospital Stay (HOSPITAL_BASED_OUTPATIENT_CLINIC_OR_DEPARTMENT_OTHER): Payer: Medicare Other | Admitting: Oncology

## 2022-01-07 ENCOUNTER — Inpatient Hospital Stay: Payer: Medicare Other

## 2022-01-07 VITALS — BP 112/77 | HR 105 | Temp 98.2°F | Resp 18 | Ht 63.0 in | Wt 183.6 lb

## 2022-01-07 DIAGNOSIS — I5022 Chronic systolic (congestive) heart failure: Secondary | ICD-10-CM | POA: Insufficient documentation

## 2022-01-07 DIAGNOSIS — C50912 Malignant neoplasm of unspecified site of left female breast: Secondary | ICD-10-CM | POA: Insufficient documentation

## 2022-01-07 DIAGNOSIS — N179 Acute kidney failure, unspecified: Secondary | ICD-10-CM | POA: Insufficient documentation

## 2022-01-07 DIAGNOSIS — D649 Anemia, unspecified: Secondary | ICD-10-CM | POA: Insufficient documentation

## 2022-01-07 DIAGNOSIS — N189 Chronic kidney disease, unspecified: Secondary | ICD-10-CM | POA: Insufficient documentation

## 2022-01-07 DIAGNOSIS — I2510 Atherosclerotic heart disease of native coronary artery without angina pectoris: Secondary | ICD-10-CM | POA: Insufficient documentation

## 2022-01-07 DIAGNOSIS — I130 Hypertensive heart and chronic kidney disease with heart failure and stage 1 through stage 4 chronic kidney disease, or unspecified chronic kidney disease: Secondary | ICD-10-CM | POA: Insufficient documentation

## 2022-01-07 DIAGNOSIS — R946 Abnormal results of thyroid function studies: Secondary | ICD-10-CM | POA: Insufficient documentation

## 2022-01-07 DIAGNOSIS — C50919 Malignant neoplasm of unspecified site of unspecified female breast: Secondary | ICD-10-CM

## 2022-01-07 DIAGNOSIS — E1122 Type 2 diabetes mellitus with diabetic chronic kidney disease: Secondary | ICD-10-CM | POA: Insufficient documentation

## 2022-01-07 DIAGNOSIS — C7951 Secondary malignant neoplasm of bone: Secondary | ICD-10-CM | POA: Insufficient documentation

## 2022-01-07 DIAGNOSIS — Z9012 Acquired absence of left breast and nipple: Secondary | ICD-10-CM | POA: Insufficient documentation

## 2022-01-07 DIAGNOSIS — E785 Hyperlipidemia, unspecified: Secondary | ICD-10-CM | POA: Insufficient documentation

## 2022-01-07 DIAGNOSIS — Z170 Estrogen receptor positive status [ER+]: Secondary | ICD-10-CM | POA: Insufficient documentation

## 2022-01-07 DIAGNOSIS — Z79899 Other long term (current) drug therapy: Secondary | ICD-10-CM | POA: Insufficient documentation

## 2022-01-07 DIAGNOSIS — R7401 Elevation of levels of liver transaminase levels: Secondary | ICD-10-CM | POA: Insufficient documentation

## 2022-01-07 DIAGNOSIS — G8929 Other chronic pain: Secondary | ICD-10-CM | POA: Insufficient documentation

## 2022-01-07 DIAGNOSIS — D259 Leiomyoma of uterus, unspecified: Secondary | ICD-10-CM | POA: Insufficient documentation

## 2022-01-07 DIAGNOSIS — I4821 Permanent atrial fibrillation: Secondary | ICD-10-CM | POA: Insufficient documentation

## 2022-01-07 MED ORDER — EPINEPHRINE HCL 5 MG/250ML IV SOLN IN NS
0.0000 ug/min | INTRAVENOUS | Status: DC
  Filled 2022-01-07 (×2): qty 250

## 2022-01-07 MED ORDER — NOREPINEPHRINE 4 MG/250ML-% IV SOLN
0.0000 ug/min | INTRAVENOUS | Status: AC
  Administered 2022-01-08: 2 ug/min via INTRAVENOUS
  Filled 2022-01-07: qty 250

## 2022-01-07 MED ORDER — TRANEXAMIC ACID (OHS) PUMP PRIME SOLUTION
2.0000 mg/kg | INTRAVENOUS | Status: DC
  Filled 2022-01-07 (×2): qty 1.57

## 2022-01-07 MED ORDER — POTASSIUM CHLORIDE 2 MEQ/ML IV SOLN
80.0000 meq | INTRAVENOUS | Status: DC
  Filled 2022-01-07 (×2): qty 40

## 2022-01-07 MED ORDER — DEXMEDETOMIDINE HCL IN NACL 400 MCG/100ML IV SOLN
0.1000 ug/kg/h | INTRAVENOUS | Status: DC
  Filled 2022-01-07 (×2): qty 100

## 2022-01-07 MED ORDER — INSULIN REGULAR(HUMAN) IN NACL 100-0.9 UT/100ML-% IV SOLN
INTRAVENOUS | Status: DC
  Filled 2022-01-07 (×2): qty 100

## 2022-01-07 MED ORDER — MILRINONE LACTATE IN DEXTROSE 20-5 MG/100ML-% IV SOLN
0.3000 ug/kg/min | INTRAVENOUS | Status: DC
  Filled 2022-01-07 (×2): qty 100

## 2022-01-07 MED ORDER — CEFAZOLIN SODIUM-DEXTROSE 2-4 GM/100ML-% IV SOLN
2.0000 g | INTRAVENOUS | Status: AC
  Administered 2022-01-08: 2 g via INTRAVENOUS
  Filled 2022-01-07 (×2): qty 100

## 2022-01-07 MED ORDER — MAGNESIUM SULFATE 50 % IJ SOLN
40.0000 meq | INTRAMUSCULAR | Status: DC
  Filled 2022-01-07 (×2): qty 9.85

## 2022-01-07 MED ORDER — HEPARIN 30,000 UNITS/1000 ML (OHS) CELLSAVER SOLUTION
Status: DC
  Filled 2022-01-07 (×2): qty 1000

## 2022-01-07 MED ORDER — PLASMA-LYTE A IV SOLN
INTRAVENOUS | Status: DC
  Filled 2022-01-07 (×2): qty 2.5

## 2022-01-07 MED ORDER — VANCOMYCIN HCL 1250 MG/250ML IV SOLN
1250.0000 mg | INTRAVENOUS | Status: DC
  Filled 2022-01-07 (×2): qty 250

## 2022-01-07 MED ORDER — TRANEXAMIC ACID (OHS) BOLUS VIA INFUSION
15.0000 mg/kg | INTRAVENOUS | Status: DC
  Filled 2022-01-07: qty 1176

## 2022-01-07 MED ORDER — TRANEXAMIC ACID 1000 MG/10ML IV SOLN
1.5000 mg/kg/h | INTRAVENOUS | Status: DC
  Filled 2022-01-07 (×2): qty 25

## 2022-01-07 MED ORDER — PHENYLEPHRINE HCL-NACL 20-0.9 MG/250ML-% IV SOLN
30.0000 ug/min | INTRAVENOUS | Status: AC
  Administered 2022-01-08: 30 ug/min via INTRAVENOUS
  Filled 2022-01-07 (×2): qty 250

## 2022-01-07 MED ORDER — CEFAZOLIN SODIUM-DEXTROSE 2-4 GM/100ML-% IV SOLN
2.0000 g | INTRAVENOUS | Status: DC
  Filled 2022-01-07: qty 100

## 2022-01-07 MED ORDER — NITROGLYCERIN IN D5W 200-5 MCG/ML-% IV SOLN
2.0000 ug/min | INTRAVENOUS | Status: DC
  Filled 2022-01-07: qty 250

## 2022-01-07 NOTE — Progress Notes (Signed)
[place] OFFICE PROGRESS NOTE   Diagnosis: Breast cancer  INTERVAL HISTORY:   Stephanie Franco returns as scheduled.  She continues letrozole.  She was treated with Zometa on 11/26/2021.  She had flulike symptoms in the day following Zometa therapy. She has multiple comorbid conditions including heart failure and renal insufficiency.  She is followed by cardiology and nephrology.  She is scheduled for a percutaneous mitral valve repair tomorrow.  She reports discomfort at the right mid to upper thigh for the past 2 weeks.  The pain is worse with weightbearing.  She takes oxycodone for chronic back pain.  She reports her overall clinical status has improved since beginning letrozole.  Objective:  Vital signs in last 24 hours:  Blood pressure 112/77, pulse (!) 105, temperature 98.2 F (36.8 C), temperature source Oral, resp. rate 18, height 5' 3"  (1.6 m), weight 183 lb 9.6 oz (83.3 kg), SpO2 98 %.   Resp: Lungs clear bilaterally, no respiratory distress Cardio: Irregular GI: No hepatosplenomegaly Vascular: No leg edema Musculoskeletal: Tender to percussion at the mid right thigh  Portacath/PICC-without erythema  Lab Results:  Lab Results  Component Value Date   WBC 8.3 01/05/2022   HGB 10.3 (L) 01/05/2022   HCT 34.3 (L) 01/05/2022   MCV 87.1 01/05/2022   PLT 233 01/05/2022   NEUTROABS 4.5 11/26/2021    CMP  Lab Results  Component Value Date   NA 135 01/05/2022   K 3.6 01/05/2022   CL 93 (L) 01/05/2022   CO2 29 01/05/2022   GLUCOSE 108 (H) 01/05/2022   BUN 45 (H) 01/05/2022   CREATININE 2.44 (H) 01/05/2022   CALCIUM 9.6 01/05/2022   PROT 7.6 01/05/2022   ALBUMIN 3.4 (L) 01/05/2022   AST 30 01/05/2022   ALT 25 01/05/2022   ALKPHOS 75 01/05/2022   BILITOT 0.8 01/05/2022   GFRNONAA 21 (L) 01/05/2022   GFRAA 40 (L) 06/05/2020    Imaging:  DG Chest 2 View  Result Date: 01/06/2022 CLINICAL DATA:  Pre-admission chest x-ray. Nonrheumatic mitral  valve regurgitation, hypertension, diabetes. EXAM: CHEST - 2 VIEW COMPARISON:  Chest x-ray dated 09/24/2021. FINDINGS: Stable marked cardiomegaly. Central pulmonary vascular congestion, likely chronic. No overt alveolar pulmonary edema. No pleural effusion or pneumothorax is seen. No acute-appearing osseous abnormality. Lytic-appearing osseous lesion within the RIGHT clavicle. No acute-appearing osseous abnormality. IMPRESSION: 1. Stable marked cardiomegaly. Central pulmonary vascular congestion, likely chronic. No overt alveolar pulmonary edema. No evidence of pneumonia. 2. Lytic-appearing osseous lesion within the RIGHT clavicle, likely related to the previously supposed multifocal osseous metastatic disease described on chest CT report of 09/22/2021 and confirmed in the pelvis by CT-guided biopsy on 09/25/2021. Electronically Signed   By: Franki Cabot M.D.   On: 01/06/2022 08:02    Medications: I have reviewed the patient's current medications.   Assessment/Plan: 1.Bone lesions of the cervical spine -MRI brain with and without contrast 09/21/2021-multiple metastatic bone lesions, largest at the lateral sphenoid wing on the left -MRI of the cervical spine with and without contrast 09/21/2021-metastatic disease throughout the cervical region most prominent at C3, C6, C7, and T1.  T3 also involved but not well evaluated. -CT chest/abdomen/pelvis without contrast 09/22/2021-diffuse lytic and sclerotic lesions in the bones compatible with metastatic disease, mild compression deformity in the inferior endplate of B14 which is indeterminate in age, indeterminate hypodensity in the posterior right lobe of the liver measuring 1.2 cm. -Bone scan 09/23/2021-numerous areas of abnormal uptake including the distal femurs 2.  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  Fibrosarcoma of the left breast -Status post left breast mastectomy, positive surgical margins 4.  Normocytic anemia 5.  Acute on chronic kidney disease 6.  History of hypercalcemia 7.  Transaminitis 8.  Elevated TSH and free T4 9.  HFrEF 35% diagnosed February 2023 10.  Permanent atrial fibrillation 11.  Hypertension 12.  CAD with prior PCI 13.  Diabetes mellitus 14.  Hyperlipidemia 15.  Uterine fibroids 16.  Hypercalcemia 11/26/2021    Disposition: Stephanie Franco has metastatic breast cancer.  She is currently maintained on letrozole.  Her clinical status has improved while on letrozole.  The plan is to continue the current treatment.  She will continue Zometa every 3 months.  We will obtain an x-ray of the right femur today to be sure she does not have a femur metastasis.  Stephanie Franco will return for an office visit and Zometa in 6 weeks.  Betsy Coder, MD  01/07/2022  10:50 AM

## 2022-01-08 ENCOUNTER — Inpatient Hospital Stay (HOSPITAL_COMMUNITY)
Admission: RE | Admit: 2022-01-08 | Discharge: 2022-02-15 | DRG: 270 | Disposition: E | Payer: Medicare Other | Attending: Cardiology | Admitting: Cardiology

## 2022-01-08 ENCOUNTER — Encounter (HOSPITAL_COMMUNITY): Admission: RE | Disposition: E | Payer: Self-pay | Source: Home / Self Care | Attending: Cardiology

## 2022-01-08 ENCOUNTER — Telehealth: Payer: Self-pay

## 2022-01-08 ENCOUNTER — Inpatient Hospital Stay (HOSPITAL_COMMUNITY): Payer: Medicare Other | Admitting: Certified Registered Nurse Anesthetist

## 2022-01-08 ENCOUNTER — Encounter (HOSPITAL_COMMUNITY): Payer: Self-pay | Admitting: Internal Medicine

## 2022-01-08 ENCOUNTER — Inpatient Hospital Stay (HOSPITAL_COMMUNITY): Payer: Medicare Other | Admitting: Physician Assistant

## 2022-01-08 ENCOUNTER — Inpatient Hospital Stay (HOSPITAL_COMMUNITY)
Admission: RE | Admit: 2022-01-08 | Discharge: 2022-01-08 | Disposition: A | Discharge status: Home / Self Care | Payer: Medicare Other | Source: Ambulatory Visit | Attending: Internal Medicine | Admitting: Internal Medicine

## 2022-01-08 DIAGNOSIS — A419 Sepsis, unspecified organism: Secondary | ICD-10-CM | POA: Diagnosis not present

## 2022-01-08 DIAGNOSIS — Z006 Encounter for examination for normal comparison and control in clinical research program: Secondary | ICD-10-CM

## 2022-01-08 DIAGNOSIS — D62 Acute posthemorrhagic anemia: Secondary | ICD-10-CM | POA: Diagnosis not present

## 2022-01-08 DIAGNOSIS — G9341 Metabolic encephalopathy: Secondary | ICD-10-CM | POA: Diagnosis not present

## 2022-01-08 DIAGNOSIS — G8929 Other chronic pain: Secondary | ICD-10-CM | POA: Diagnosis present

## 2022-01-08 DIAGNOSIS — I340 Nonrheumatic mitral (valve) insufficiency: Secondary | ICD-10-CM

## 2022-01-08 DIAGNOSIS — N184 Chronic kidney disease, stage 4 (severe): Secondary | ICD-10-CM | POA: Diagnosis not present

## 2022-01-08 DIAGNOSIS — E1169 Type 2 diabetes mellitus with other specified complication: Secondary | ICD-10-CM | POA: Diagnosis present

## 2022-01-08 DIAGNOSIS — I252 Old myocardial infarction: Secondary | ICD-10-CM

## 2022-01-08 DIAGNOSIS — I462 Cardiac arrest due to underlying cardiac condition: Secondary | ICD-10-CM | POA: Diagnosis not present

## 2022-01-08 DIAGNOSIS — J45909 Unspecified asthma, uncomplicated: Secondary | ICD-10-CM | POA: Diagnosis present

## 2022-01-08 DIAGNOSIS — I255 Ischemic cardiomyopathy: Secondary | ICD-10-CM | POA: Diagnosis present

## 2022-01-08 DIAGNOSIS — K219 Gastro-esophageal reflux disease without esophagitis: Secondary | ICD-10-CM | POA: Diagnosis present

## 2022-01-08 DIAGNOSIS — E1122 Type 2 diabetes mellitus with diabetic chronic kidney disease: Secondary | ICD-10-CM | POA: Diagnosis present

## 2022-01-08 DIAGNOSIS — E1165 Type 2 diabetes mellitus with hyperglycemia: Secondary | ICD-10-CM | POA: Diagnosis not present

## 2022-01-08 DIAGNOSIS — Z515 Encounter for palliative care: Secondary | ICD-10-CM

## 2022-01-08 DIAGNOSIS — I5042 Chronic combined systolic (congestive) and diastolic (congestive) heart failure: Secondary | ICD-10-CM | POA: Diagnosis present

## 2022-01-08 DIAGNOSIS — I4821 Permanent atrial fibrillation: Secondary | ICD-10-CM | POA: Diagnosis present

## 2022-01-08 DIAGNOSIS — C50919 Malignant neoplasm of unspecified site of unspecified female breast: Secondary | ICD-10-CM | POA: Diagnosis present

## 2022-01-08 DIAGNOSIS — I509 Heart failure, unspecified: Secondary | ICD-10-CM | POA: Diagnosis not present

## 2022-01-08 DIAGNOSIS — I2721 Secondary pulmonary arterial hypertension: Secondary | ICD-10-CM | POA: Diagnosis present

## 2022-01-08 DIAGNOSIS — I482 Chronic atrial fibrillation, unspecified: Secondary | ICD-10-CM | POA: Diagnosis not present

## 2022-01-08 DIAGNOSIS — L89152 Pressure ulcer of sacral region, stage 2: Secondary | ICD-10-CM | POA: Diagnosis present

## 2022-01-08 DIAGNOSIS — I420 Dilated cardiomyopathy: Secondary | ICD-10-CM | POA: Diagnosis present

## 2022-01-08 DIAGNOSIS — N179 Acute kidney failure, unspecified: Secondary | ICD-10-CM | POA: Diagnosis present

## 2022-01-08 DIAGNOSIS — I472 Ventricular tachycardia, unspecified: Secondary | ICD-10-CM | POA: Diagnosis not present

## 2022-01-08 DIAGNOSIS — I5023 Acute on chronic systolic (congestive) heart failure: Secondary | ICD-10-CM | POA: Diagnosis present

## 2022-01-08 DIAGNOSIS — L8990 Pressure ulcer of unspecified site, unspecified stage: Secondary | ICD-10-CM | POA: Insufficient documentation

## 2022-01-08 DIAGNOSIS — Z66 Do not resuscitate: Secondary | ICD-10-CM | POA: Diagnosis not present

## 2022-01-08 DIAGNOSIS — I5082 Biventricular heart failure: Secondary | ICD-10-CM | POA: Diagnosis present

## 2022-01-08 DIAGNOSIS — I132 Hypertensive heart and chronic kidney disease with heart failure and with stage 5 chronic kidney disease, or end stage renal disease: Secondary | ICD-10-CM | POA: Diagnosis present

## 2022-01-08 DIAGNOSIS — I110 Hypertensive heart disease with heart failure: Secondary | ICD-10-CM | POA: Diagnosis not present

## 2022-01-08 DIAGNOSIS — I5043 Acute on chronic combined systolic (congestive) and diastolic (congestive) heart failure: Secondary | ICD-10-CM | POA: Diagnosis present

## 2022-01-08 DIAGNOSIS — C7951 Secondary malignant neoplasm of bone: Secondary | ICD-10-CM | POA: Diagnosis present

## 2022-01-08 DIAGNOSIS — G4733 Obstructive sleep apnea (adult) (pediatric): Secondary | ICD-10-CM | POA: Diagnosis present

## 2022-01-08 DIAGNOSIS — I2510 Atherosclerotic heart disease of native coronary artery without angina pectoris: Secondary | ICD-10-CM | POA: Diagnosis not present

## 2022-01-08 DIAGNOSIS — R570 Cardiogenic shock: Secondary | ICD-10-CM | POA: Diagnosis present

## 2022-01-08 DIAGNOSIS — J9601 Acute respiratory failure with hypoxia: Secondary | ICD-10-CM | POA: Diagnosis not present

## 2022-01-08 DIAGNOSIS — Z96641 Presence of right artificial hip joint: Secondary | ICD-10-CM | POA: Diagnosis present

## 2022-01-08 DIAGNOSIS — N186 End stage renal disease: Secondary | ICD-10-CM | POA: Diagnosis not present

## 2022-01-08 DIAGNOSIS — Z7901 Long term (current) use of anticoagulants: Secondary | ICD-10-CM

## 2022-01-08 DIAGNOSIS — Z955 Presence of coronary angioplasty implant and graft: Secondary | ICD-10-CM

## 2022-01-08 DIAGNOSIS — Z20822 Contact with and (suspected) exposure to covid-19: Secondary | ICD-10-CM | POA: Diagnosis present

## 2022-01-08 DIAGNOSIS — Z923 Personal history of irradiation: Secondary | ICD-10-CM

## 2022-01-08 DIAGNOSIS — I272 Pulmonary hypertension, unspecified: Secondary | ICD-10-CM | POA: Diagnosis present

## 2022-01-08 DIAGNOSIS — Z853 Personal history of malignant neoplasm of breast: Secondary | ICD-10-CM

## 2022-01-08 DIAGNOSIS — E785 Hyperlipidemia, unspecified: Secondary | ICD-10-CM | POA: Diagnosis present

## 2022-01-08 DIAGNOSIS — N183 Chronic kidney disease, stage 3 unspecified: Secondary | ICD-10-CM | POA: Diagnosis present

## 2022-01-08 DIAGNOSIS — Z801 Family history of malignant neoplasm of trachea, bronchus and lung: Secondary | ICD-10-CM

## 2022-01-08 DIAGNOSIS — I130 Hypertensive heart and chronic kidney disease with heart failure and stage 1 through stage 4 chronic kidney disease, or unspecified chronic kidney disease: Secondary | ICD-10-CM | POA: Diagnosis not present

## 2022-01-08 DIAGNOSIS — Z538 Procedure and treatment not carried out for other reasons: Secondary | ICD-10-CM | POA: Diagnosis not present

## 2022-01-08 DIAGNOSIS — E8809 Other disorders of plasma-protein metabolism, not elsewhere classified: Secondary | ICD-10-CM | POA: Diagnosis not present

## 2022-01-08 DIAGNOSIS — Z9012 Acquired absence of left breast and nipple: Secondary | ICD-10-CM

## 2022-01-08 DIAGNOSIS — Z8249 Family history of ischemic heart disease and other diseases of the circulatory system: Secondary | ICD-10-CM

## 2022-01-08 DIAGNOSIS — Z79899 Other long term (current) drug therapy: Secondary | ICD-10-CM

## 2022-01-08 DIAGNOSIS — E119 Type 2 diabetes mellitus without complications: Secondary | ICD-10-CM

## 2022-01-08 DIAGNOSIS — I469 Cardiac arrest, cause unspecified: Secondary | ICD-10-CM | POA: Diagnosis not present

## 2022-01-08 DIAGNOSIS — I10 Essential (primary) hypertension: Secondary | ICD-10-CM | POA: Diagnosis present

## 2022-01-08 DIAGNOSIS — Z794 Long term (current) use of insulin: Secondary | ICD-10-CM

## 2022-01-08 DIAGNOSIS — Z87891 Personal history of nicotine dependence: Secondary | ICD-10-CM

## 2022-01-08 HISTORY — PX: TEE WITHOUT CARDIOVERSION: SHX5443

## 2022-01-08 HISTORY — DX: Nonrheumatic mitral (valve) insufficiency: I34.0

## 2022-01-08 HISTORY — PX: MITRAL VALVE REPAIR: CATH118311

## 2022-01-08 LAB — ECHO TEE
MV M vel: 4.19 m/s
MV Peak grad: 70.2 mmHg
Radius: 0.7 cm

## 2022-01-08 LAB — GLUCOSE, CAPILLARY
Glucose-Capillary: 73 mg/dL (ref 70–99)
Glucose-Capillary: 56 mg/dL — ABNORMAL LOW (ref 70–99)
Glucose-Capillary: 114 mg/dL — ABNORMAL HIGH (ref 70–99)
Glucose-Capillary: 74 mg/dL (ref 70–99)
Glucose-Capillary: 83 mg/dL (ref 70–99)
Glucose-Capillary: 177 mg/dL — ABNORMAL HIGH (ref 70–99)

## 2022-01-08 LAB — CBC
HCT: 29.5 % — ABNORMAL LOW (ref 36.0–46.0)
Hemoglobin: 9.2 g/dL — ABNORMAL LOW (ref 12.0–15.0)
MCH: 26.6 pg (ref 26.0–34.0)
MCHC: 31.2 g/dL (ref 30.0–36.0)
MCV: 85.3 fL (ref 80.0–100.0)
Platelets: 202 10*3/uL (ref 150–400)
RBC: 3.46 MIL/uL — ABNORMAL LOW (ref 3.87–5.11)
RDW: 19.5 % — ABNORMAL HIGH (ref 11.5–15.5)
WBC: 10 10*3/uL (ref 4.0–10.5)
nRBC: 0 % (ref 0.0–0.2)

## 2022-01-08 LAB — POCT ACTIVATED CLOTTING TIME
Activated Clotting Time: 269 seconds
Activated Clotting Time: 281 seconds
Activated Clotting Time: 299 seconds
Activated Clotting Time: 305 seconds

## 2022-01-08 SURGERY — MITRAL VALVE REPAIR
Anesthesia: General

## 2022-01-08 MED ORDER — PROTAMINE SULFATE 10 MG/ML IV SOLN
INTRAVENOUS | Status: DC | PRN
  Administered 2022-01-08: 20 mg via INTRAVENOUS
  Administered 2022-01-08: 10 mg via INTRAVENOUS
  Administered 2022-01-08: 30 mg via INTRAVENOUS

## 2022-01-08 MED ORDER — HEPARIN (PORCINE) IN NACL 2000-0.9 UNIT/L-% IV SOLN
INTRAVENOUS | Status: DC | PRN
  Administered 2022-01-08 (×3): 1000 mL

## 2022-01-08 MED ORDER — DEXTROSE 50 % IV SOLN: 12.5000 g | INTRAVENOUS | Status: AC

## 2022-01-08 MED ORDER — EZETIMIBE 10 MG PO TABS
10.0000 mg | ORAL_TABLET | Freq: Every day | ORAL | Status: DC
  Administered 2022-01-09 – 2022-01-11 (×3): 10 mg via ORAL
  Filled 2022-01-08 (×3): qty 1

## 2022-01-08 MED ORDER — SPIRONOLACTONE 25 MG PO TABS
25.0000 mg | ORAL_TABLET | Freq: Every day | ORAL | Status: DC
Start: 2022-01-08 — End: 2022-01-10
  Administered 2022-01-09: 25 mg via ORAL
  Filled 2022-01-08 (×2): qty 1

## 2022-01-08 MED ORDER — OXYCODONE HCL 5 MG PO TABS
5.0000 mg | ORAL_TABLET | ORAL | Status: DC | PRN
  Administered 2022-01-08 – 2022-01-10 (×7): 5 mg via ORAL
  Filled 2022-01-08 (×7): qty 1

## 2022-01-08 MED ORDER — ISOSORBIDE MONONITRATE ER 60 MG PO TB24
60.0000 mg | ORAL_TABLET | Freq: Every day | ORAL | Status: DC
  Administered 2022-01-09: 60 mg via ORAL
  Filled 2022-01-08 (×2): qty 1

## 2022-01-08 MED ORDER — MORPHINE SULFATE (PF) 2 MG/ML IV SOLN
INTRAVENOUS | Status: AC
  Filled 2022-01-08: qty 1

## 2022-01-08 MED ORDER — DAPAGLIFLOZIN PROPANEDIOL 10 MG PO TABS
10.0000 mg | ORAL_TABLET | Freq: Every day | ORAL | Status: DC
Start: 2022-01-08 — End: 2022-01-10
  Administered 2022-01-09 – 2022-01-10 (×2): 10 mg via ORAL
  Filled 2022-01-08 (×2): qty 1

## 2022-01-08 MED ORDER — LACTATED RINGERS IV SOLN: INTRAVENOUS | Status: DC | PRN

## 2022-01-08 MED ORDER — EPHEDRINE SULFATE (PRESSORS) 50 MG/ML IJ SOLN
INTRAMUSCULAR | Status: DC | PRN
  Administered 2022-01-08: 5 mg via INTRAVENOUS

## 2022-01-08 MED ORDER — INSULIN ASPART 100 UNIT/ML IJ SOLN: 0.0000 [IU] | INTRAMUSCULAR | Status: DC | PRN

## 2022-01-08 MED ORDER — INSULIN ASPART 100 UNIT/ML IJ SOLN
0.0000 [IU] | Freq: Three times a day (TID) | INTRAMUSCULAR | Status: DC
  Administered 2022-01-09 – 2022-01-10 (×3): 3 [IU] via SUBCUTANEOUS
  Administered 2022-01-10: 2 [IU] via SUBCUTANEOUS

## 2022-01-08 MED ORDER — SODIUM CHLORIDE 0.9 % IV SOLN: INTRAVENOUS | Status: DC

## 2022-01-08 MED ORDER — PHENYLEPHRINE HCL (PRESSORS) 10 MG/ML IV SOLN
INTRAVENOUS | Status: DC | PRN
  Administered 2022-01-08 (×3): 80 ug via INTRAVENOUS

## 2022-01-08 MED ORDER — LORAZEPAM 1 MG PO TABS
1.0000 mg | ORAL_TABLET | Freq: Two times a day (BID) | ORAL | Status: DC | PRN
  Administered 2022-01-09 – 2022-01-17 (×3): 1 mg via ORAL
  Filled 2022-01-08 (×5): qty 1

## 2022-01-08 MED ORDER — MORPHINE SULFATE (PF) 2 MG/ML IV SOLN
2.0000 mg | Freq: Once | INTRAVENOUS | Status: AC
  Administered 2022-01-08: 2 mg via INTRAVENOUS

## 2022-01-08 MED ORDER — PROPOFOL 10 MG/ML IV BOLUS
INTRAVENOUS | Status: DC | PRN
  Administered 2022-01-08: 90 mg via INTRAVENOUS

## 2022-01-08 MED ORDER — FENTANYL CITRATE (PF) 250 MCG/5ML IJ SOLN
INTRAMUSCULAR | Status: DC | PRN
  Administered 2022-01-08: 100 ug via INTRAVENOUS

## 2022-01-08 MED ORDER — ONDANSETRON HCL 4 MG/2ML IJ SOLN
4.0000 mg | Freq: Four times a day (QID) | INTRAMUSCULAR | Status: DC | PRN
  Administered 2022-01-17: 4 mg via INTRAVENOUS
  Filled 2022-01-08: qty 2

## 2022-01-08 MED ORDER — INSULIN GLARGINE 100 UNIT/ML ~~LOC~~ SOLN
30.0000 [IU] | Freq: Every day | SUBCUTANEOUS | Status: DC
  Administered 2022-01-08 – 2022-01-10 (×3): 30 [IU] via SUBCUTANEOUS
  Filled 2022-01-08 (×4): qty 0.3

## 2022-01-08 MED ORDER — APIXABAN 5 MG PO TABS
5.0000 mg | ORAL_TABLET | Freq: Two times a day (BID) | ORAL | Status: DC
  Administered 2022-01-09 – 2022-01-10 (×3): 5 mg via ORAL
  Filled 2022-01-08 (×3): qty 1

## 2022-01-08 MED ORDER — CHLORHEXIDINE GLUCONATE 4 % EX LIQD: 30.0000 mL | CUTANEOUS | Status: DC

## 2022-01-08 MED ORDER — LACTATED RINGERS IV SOLN: INTRAVENOUS | Status: DC

## 2022-01-08 MED ORDER — GABAPENTIN 600 MG PO TABS
600.0000 mg | ORAL_TABLET | Freq: Three times a day (TID) | ORAL | Status: DC
  Administered 2022-01-09 – 2022-01-10 (×4): 600 mg via ORAL
  Filled 2022-01-08 (×4): qty 1

## 2022-01-08 MED ORDER — ADULT MULTIVITAMIN W/MINERALS CH
1.0000 | ORAL_TABLET | Freq: Every day | ORAL | Status: DC
  Administered 2022-01-09 – 2022-01-11 (×3): 1 via ORAL
  Filled 2022-01-08 (×3): qty 1

## 2022-01-08 MED ORDER — LABETALOL HCL 5 MG/ML IV SOLN: 10.0000 mg | INTRAVENOUS | Status: AC | PRN

## 2022-01-08 MED ORDER — CHLORHEXIDINE GLUCONATE 0.12 % MT SOLN: 15.0000 mL | Freq: Once | OROMUCOSAL | Status: AC

## 2022-01-08 MED ORDER — LIDOCAINE 2% (20 MG/ML) 5 ML SYRINGE
INTRAMUSCULAR | Status: DC | PRN
  Administered 2022-01-08: 40 mg via INTRAVENOUS

## 2022-01-08 MED ORDER — CHLORHEXIDINE GLUCONATE 4 % EX LIQD: 60.0000 mL | Freq: Once | CUTANEOUS | Status: DC

## 2022-01-08 MED ORDER — ORAL CARE MOUTH RINSE: 15.0000 mL | Freq: Once | OROMUCOSAL | Status: AC

## 2022-01-08 MED ORDER — DEXTROSE 50 % IV SOLN
INTRAVENOUS | Status: AC
  Administered 2022-01-08: 25 mL
  Filled 2022-01-08: qty 50

## 2022-01-08 MED ORDER — NAPHAZOLINE-GLYCERIN 0.012-0.25 % OP SOLN: 2.0000 [drp] | Freq: Every day | OPHTHALMIC | Status: DC | PRN

## 2022-01-08 MED ORDER — CHLORHEXIDINE GLUCONATE 0.12 % MT SOLN: 15.0000 mL | Freq: Once | OROMUCOSAL | Status: DC

## 2022-01-08 MED ORDER — HYDRALAZINE HCL 25 MG PO TABS
12.5000 mg | ORAL_TABLET | Freq: Three times a day (TID) | ORAL | Status: DC
  Administered 2022-01-08 – 2022-01-09 (×2): 12.5 mg via ORAL
  Filled 2022-01-08 (×3): qty 1

## 2022-01-08 MED ORDER — HEPARIN (PORCINE) IN NACL 1000-0.9 UT/500ML-% IV SOLN
INTRAVENOUS | Status: DC | PRN
  Administered 2022-01-08 (×2): 500 mL

## 2022-01-08 MED ORDER — ROCURONIUM BROMIDE 10 MG/ML (PF) SYRINGE
PREFILLED_SYRINGE | INTRAVENOUS | Status: DC | PRN
  Administered 2022-01-08: 50 mg via INTRAVENOUS
  Administered 2022-01-08 (×2): 20 mg via INTRAVENOUS
  Administered 2022-01-08 (×2): 10 mg via INTRAVENOUS

## 2022-01-08 MED ORDER — SENNOSIDES-DOCUSATE SODIUM 8.6-50 MG PO TABS
1.0000 | ORAL_TABLET | Freq: Two times a day (BID) | ORAL | Status: DC
  Administered 2022-01-08 – 2022-01-11 (×5): 1 via ORAL
  Filled 2022-01-08 (×6): qty 1

## 2022-01-08 MED ORDER — HEPARIN SODIUM (PORCINE) 1000 UNIT/ML IJ SOLN
INTRAMUSCULAR | Status: DC | PRN
  Administered 2022-01-08: 4000 [IU] via INTRAVENOUS
  Administered 2022-01-08: 3000 [IU] via INTRAVENOUS
  Administered 2022-01-08: 1000 [IU] via INTRAVENOUS
  Administered 2022-01-08: 10000 [IU] via INTRAVENOUS
  Administered 2022-01-08: 2000 [IU] via INTRAVENOUS
  Administered 2022-01-08: 1000 [IU] via INTRAVENOUS

## 2022-01-08 MED ORDER — ACETAMINOPHEN 325 MG PO TABS
650.0000 mg | ORAL_TABLET | ORAL | Status: DC | PRN
Start: 2022-01-08 — End: 2022-01-11
  Administered 2022-01-08 – 2022-01-09 (×2): 650 mg via ORAL
  Filled 2022-01-08 (×2): qty 2

## 2022-01-08 MED ORDER — SODIUM CHLORIDE 0.9 % IV SOLN
250.0000 mL | INTRAVENOUS | Status: DC | PRN
Start: 2022-01-08 — End: 2022-01-19
  Administered 2022-01-10: 250 mL via INTRAVENOUS

## 2022-01-08 MED ORDER — OXYCODONE HCL 10 MG PO TABS
5.0000 mg | ORAL_TABLET | Freq: Three times a day (TID) | ORAL | Status: DC | PRN
Start: 2022-01-08 — End: 2022-01-08

## 2022-01-08 MED ORDER — SUGAMMADEX SODIUM 200 MG/2ML IV SOLN
INTRAVENOUS | Status: DC | PRN
  Administered 2022-01-08 (×2): 100 mg via INTRAVENOUS

## 2022-01-08 MED ORDER — ONDANSETRON HCL 4 MG/2ML IJ SOLN
INTRAMUSCULAR | Status: DC | PRN
  Administered 2022-01-08: 4 mg via INTRAVENOUS

## 2022-01-08 MED ORDER — CEFAZOLIN SODIUM-DEXTROSE 2-4 GM/100ML-% IV SOLN: 2.0000 g | INTRAVENOUS | Status: DC

## 2022-01-08 MED ORDER — ALLOPURINOL 300 MG PO TABS
300.0000 mg | ORAL_TABLET | Freq: Every evening | ORAL | Status: DC
  Administered 2022-01-09: 300 mg via ORAL
  Filled 2022-01-08: qty 1

## 2022-01-08 MED ORDER — CEFAZOLIN SODIUM-DEXTROSE 2-4 GM/100ML-% IV SOLN
INTRAVENOUS | Status: AC
  Filled 2022-01-08: qty 100

## 2022-01-08 MED ORDER — LORATADINE 10 MG PO TABS: 10.0000 mg | ORAL_TABLET | Freq: Every day | ORAL | Status: DC | PRN

## 2022-01-08 MED ORDER — BIOTIN 1000 MCG PO TABS: 1000.0000 ug | ORAL_TABLET | Freq: Every day | ORAL | Status: DC

## 2022-01-08 MED ORDER — TORSEMIDE 20 MG PO TABS
60.0000 mg | ORAL_TABLET | Freq: Two times a day (BID) | ORAL | Status: DC
  Administered 2022-01-08 – 2022-01-09 (×2): 60 mg via ORAL
  Filled 2022-01-08 (×2): qty 3

## 2022-01-08 MED ORDER — PANTOPRAZOLE SODIUM 40 MG PO TBEC
40.0000 mg | DELAYED_RELEASE_TABLET | Freq: Every day | ORAL | Status: DC
  Administered 2022-01-09: 40 mg via ORAL
  Filled 2022-01-08 (×2): qty 1

## 2022-01-08 MED ORDER — POTASSIUM CHLORIDE CRYS ER 20 MEQ PO TBCR: 60.0000 meq | EXTENDED_RELEASE_TABLET | ORAL | Status: DC

## 2022-01-08 MED ORDER — SODIUM CHLORIDE 0.9% FLUSH: 3.0000 mL | INTRAVENOUS | Status: DC | PRN

## 2022-01-08 MED ORDER — SODIUM CHLORIDE 0.9% FLUSH
3.0000 mL | Freq: Two times a day (BID) | INTRAVENOUS | Status: DC
  Administered 2022-01-08 – 2022-01-17 (×18): 3 mL via INTRAVENOUS

## 2022-01-08 MED ORDER — ALBUTEROL SULFATE (2.5 MG/3ML) 0.083% IN NEBU: 3.0000 mL | INHALATION_SOLUTION | Freq: Four times a day (QID) | RESPIRATORY_TRACT | Status: DC | PRN

## 2022-01-08 MED ORDER — ROSUVASTATIN CALCIUM 20 MG PO TABS
40.0000 mg | ORAL_TABLET | Freq: Every day | ORAL | Status: DC
  Administered 2022-01-09 – 2022-01-11 (×3): 40 mg via ORAL
  Filled 2022-01-08 (×3): qty 2

## 2022-01-08 MED ORDER — INSULIN ASPART 100 UNIT/ML IJ SOLN
0.0000 [IU] | Freq: Every day | INTRAMUSCULAR | Status: DC
  Administered 2022-01-09: 2 [IU] via SUBCUTANEOUS

## 2022-01-08 MED ORDER — CHLORHEXIDINE GLUCONATE 0.12 % MT SOLN
OROMUCOSAL | Status: AC
  Administered 2022-01-08: 15 mL via OROMUCOSAL
  Filled 2022-01-08: qty 15

## 2022-01-08 MED ORDER — LETROZOLE 2.5 MG PO TABS
2.5000 mg | ORAL_TABLET | Freq: Every day | ORAL | Status: DC
  Administered 2022-01-09 – 2022-01-11 (×3): 2.5 mg via ORAL
  Filled 2022-01-08 (×3): qty 1

## 2022-01-08 MED ORDER — HYDRALAZINE HCL 20 MG/ML IJ SOLN: 10.0000 mg | INTRAMUSCULAR | Status: AC | PRN

## 2022-01-08 SURGICAL SUPPLY — 41 items
BALLN MUSTANG 12.0X40 135 (BALLOONS) ×1
BALLN TRUE 18X4.5 (BALLOONS) ×1
BALLN VASC XXL 7F 14X4X120 (BALLOONS) ×1
BALLOON MUSTANG 12.0X40 135 (BALLOONS) IMPLANT
BALLOON TRUE 18X4.5 (BALLOONS) IMPLANT
BALLOON VASC XXL 7F 14X4X120 (BALLOONS) IMPLANT
CATH DIAG 6FR PIGTAIL ANGLED (CATHETERS) IMPLANT
CATH EXPO 5F MPA-1 (CATHETERS) IMPLANT
CATH INFINITI JR4 5F (CATHETERS) IMPLANT
CATH MITRA STEERABLE GUIDE (CATHETERS) IMPLANT
CATH MUSTANG 10X20X135 (BALLOONS) IMPLANT
CATH SUPER TORQUE PLUS 6F MPA1 (CATHETERS) IMPLANT
CLOSURE PERCLOSE PROSTYLE (VASCULAR PRODUCTS) IMPLANT
DRYSEAL FLEXSHEATH 24FR 33CM (SHEATH) ×1
DRYSEAL FLEXSHEATH 26FR 33CM (SHEATH) ×1
DRYSEAL FLEXSHEATH 26FR 65CM (SHEATH) ×1
GUIDEWIRE CNFDA BRKR CVD (WIRE) IMPLANT
KIT ENCORE 26 ADVANTAGE (KITS) IMPLANT
KIT HEART LEFT (KITS) ×2 IMPLANT
KIT VERSACROSS LRG ACCESS (CATHETERS) IMPLANT
PACK CARDIAC CATHETERIZATION (CUSTOM PROCEDURE TRAY) ×1 IMPLANT
SHEATH AGILIS NXT 8.5F 71CM (SHEATH) IMPLANT
SHEATH DILAT COONS TAPER 22F (SHEATH) IMPLANT
SHEATH DRYSEAL FLEX 24FR 33CM (SHEATH) IMPLANT
SHEATH DRYSEAL FLEX 26FR 33CM (SHEATH) IMPLANT
SHEATH DRYSEAL FLEX 26FR 65CM (SHEATH) IMPLANT
SHEATH PINNACLE 8F 10CM (SHEATH) IMPLANT
SHEATH PROBE COVER 6X72 (BAG) ×1 IMPLANT
SHIELD RADPAD SCOOP 12X17 (MISCELLANEOUS) IMPLANT
STOPCOCK MORSE 400PSI 3WAY (MISCELLANEOUS) ×6 IMPLANT
SYSTEM MITRACLIP G4 (SYSTAGENIX WOUND MANAGEMENT) IMPLANT
TRANSDUCER W/STOPCOCK (MISCELLANEOUS) ×1 IMPLANT
TUBING ART PRESS 72  MALE/FEM (TUBING) ×1
TUBING ART PRESS 72 MALE/FEM (TUBING) ×1 IMPLANT
TUBING CIL FLEX 10 FLL-RA (TUBING) IMPLANT
WIRE EMERALD 3MM-J .035X150CM (WIRE) IMPLANT
WIRE EMERALD 3MM-J .035X260CM (WIRE) IMPLANT
WIRE G VAS 035X260 STIFF (WIRE) IMPLANT
WIRE MICRO SET SILHO 5FR 7 (SHEATH) IMPLANT
WIRE SAFARI SM CURVE 275 (WIRE) IMPLANT
WIRE STIFF LUNDERQUIST 260CM (WIRE) IMPLANT

## 2022-01-08 NOTE — Transfer of Care (Signed)
Immediate Anesthesia Transfer of Care Note  Patient: Stephanie Franco  Procedure(s) Performed: MITRAL VALVE REPAIR TRANSESOPHAGEAL ECHOCARDIOGRAM (TEE)  Patient Location: PACU  Anesthesia Type:General  Level of Consciousness: awake, alert  and oriented  Airway & Oxygen Therapy: Patient Spontanous Breathing and Patient connected to nasal cannula oxygen  Post-op Assessment: Report given to RN and Post -op Vital signs reviewed and stable  Post vital signs: Reviewed and stable  Last Vitals:  Vitals Value Taken Time  BP 121/72 12/19/2021 1700  Temp 36.7 C 12/25/2021 1640  Pulse 56 01/14/2022 1702  Resp 20 01/13/2022 1702  SpO2 96 % 01/04/2022 1702  Vitals shown include unvalidated device data.  Last Pain:  Vitals:   12/30/2021 1557  TempSrc:   PainSc: 10-Worst pain ever      Patients Stated Pain Goal: 2 (35/32/99 2426)  Complications:  Encounter Notable Events  Notable Event Outcome Phase Comment  Difficult to intubate - unexpected  Intraprocedure Filed from anesthesia note documentation.

## 2022-01-08 NOTE — Anesthesia Procedure Notes (Signed)
Procedure Name: Intubation Date/Time: 12/17/2021 11:21 AM  Performed by: Glynda Jaeger, CRNAPre-anesthesia Checklist: Patient identified, Patient being monitored, Timeout performed, Emergency Drugs available and Suction available Patient Re-evaluated:Patient Re-evaluated prior to induction Oxygen Delivery Method: Circle System Utilized Preoxygenation: Pre-oxygenation with 100% oxygen Induction Type: IV induction Ventilation: Mask ventilation without difficulty Laryngoscope Size: Glidescope and 3 Grade View: Grade I Tube type: Oral Tube size: 7.5 mm Number of attempts: 1 Airway Equipment and Method: Stylet Placement Confirmation: ETT inserted through vocal cords under direct vision, positive ETCO2 and breath sounds checked- equal and bilateral Secured at: 23 cm Tube secured with: Tape Dental Injury: Teeth and Oropharynx as per pre-operative assessment  Difficulty Due To: Difficulty was unanticipated and Difficult Airway- due to reduced neck mobility Comments: DL with Mac 4, grade 3 view

## 2022-01-08 NOTE — Telephone Encounter (Signed)
I called and spoke with the patient's husband. He verbal understanding and had no further questions or concerns

## 2022-01-08 NOTE — Op Note (Signed)
[facility]   MULTIDISCIPLINARY HEART TEAM  Date of Procedure:  01/15/2022  Preoperative Diagnosis: Severe Symptomatic Mitral Regurgitation (Stage D)  Postoperative Diagnosis: Same   Procedure Performed: Ultrasound-guided right transfemoral venous access Double PreClose right femoral vein Transseptal puncture using Bailess RF needle  Surgeon: Lenna Sciara, MD Co-Surgeon: Blane Ohara, MD   Echocardiographer: Sanda Klein, MD  Anesthesiologist: Albertha Ghee, MD  Device Implant: None  Procedural Indication: Severe Non-rheumatic Mitral Regurgitation (Stage D)   Brief History: 71 year old woman with permanent atrial fibrillation, coronary artery disease status post remote LAD PCI and nonischemic cardiomyopathy with LVEF 30 to 35%.  The patient has developed severe, symptomatic mitral regurgitation with posterior leaflet restriction and after heart team review with advanced heart failure specialist and structural heart specialist, the patient is referred for transcatheter edge-to-edge repair of the mitral valve in the setting of New [place] Heart Association functional class III symptoms.  The patient is also been diagnosed with bony metastases with breast cancer origin.  She is under treatment at the cancer center by Dr. Benay Spice.  Echo Findings: Preop:  Severe LV dysfunction Severe mitral regurgitation with restriction of the posterior mitral valve leaflet and severe 4+ eccentric mitral regurgitation with moderate tricuspid regurgitation   Procedural Details: Prep The patient is brought to the cardiac catheterization lab in the fasting state. General anesthesia is induced. The patient is prepped from the groin to chin.   Venous Access Using ultrasound guidance, the right femoral vein is punctured. Ultrasound images are captured and stored in the patient's chart. The vein is dilated and 2 Perclose devices are deplyed at 10' and 2' positions to 'Preclose' the  femoral vein. An 8 Fr sheath is inserted.  Transseptal Puncture A Baylis Versacross wire is advanced into the SVC A Baylis transseptal dilator is advanced into the SVC, and the VersaCross RF wire is retracted into the dilator  The transseptal sheath is retracted into the RA under fluoroscopic and echo guidance to obtain position on the posterior fossa where echo measurements are made to assure appropriate access to the mitral valve. Once proper position is confirmed by echo, RF energy is delivered and the VersaCross wire is advanced into the LA without resistance. The dilator is advanced over the wire where proper position is confirmed by echo  Weight based IV heparin is administered and a therapeutic ACT > 250 is confirmed  Steerable Guide Catheter Insertion The VersaCross wire is positioned at the left upper pulmonary vein The femoral vein is progressively dilated and the 24 Fr Steerable guide catheter is inserted and then directed towards the interatrial septum.  The dilator would cross the septum without difficulty, but the sheath tented the septum without crossing.  We ultimately changed out to a safari wire but still could not cross the septum with the sheath.  At that point, we decided to perform balloon septoplasty initially with a 12 x 2 cm balloon.  The balloon is flossed back-and-forth through the septum.  Even with this, the steerable guide catheter would not cross.  We removed the steerable guide catheter and placed a short 26 French dry seal sheath in the femoral vein.  This provided extra support and we advanced an Agilis catheter through the vein into the left atrium over a guidewire.  Using a JR4 catheter and a J-wire the mitral valve is crossed and ultimately the wire was exchanged out for a safari wire but the shape would not form well in the left ventricle.  This is changed out for a Confida wire.  The Confida gave very good support and was positioned appropriately the apex of the left  ventricle across the mitral valve.  The septum is dilated with a 14 mm balloon but we were still unsuccessful with crossing.  Finally, the septum is dilated with an 18 mm true balloon and final attempt is made to cross by advancing the dilator all the way down through the septum and into the left ventricle.  Even with this much support the guide catheter would not cross.  At this point we were beyond 60 minutes of fluoroscopy time and we felt like we had exhausted her options at crossing the septum.  The procedure was aborted.  Final imaging is performed and the dilator is in wire are pulled back across the septum.  There is left-to-right flow with no indication to close the septum.  There is no pericardial effusion.  Patient remained stable throughout the procedure.   Estimated blood loss: minimal  There are no immediate procedural complications. The patient is transferred to the post-procedure recovery area in stable condition.   FINAL CONCLUSION: Unsuccessful transcatheter edge-to-edge repair of the mitral valve due to inability to advance the steerable guide catheter across the interatrial septum  Sherren Mocha 12/18/2021 4:45 PM

## 2022-01-08 NOTE — H&P (Signed)
Cardiology Admission History and Physical:   Patient ID: Stephanie Franco MRN: [id number]; DOB: June 25, 1950   Admission date: 12/21/2021  PCP:  Carlena Hurl, PA-C   [place] Prov[facility] Cardiologist:  Glenetta Hew, MD        Chief Complaint: Dyspnea  History of Present Illness:   Stephanie Franco is a 71 year old female with a history of permanent atrial fibrillation on apixaban, coronary artery disease status post remote PCI of the mid LAD, nonischemic cardiomyopathy with ejection fraction of 30 to 35%, severe mitral regurgitation, chronic kidney disease stage III, hypertension, hyperlipidemia, and type 2 diabetes who has been managed by the heart failure team.  Due to her severe symptomatic mitral regurgitation she was referred to the structural heart disease division.  A TEE was performed which demonstrated severe posteriorly directed eccentric mitral regurgitation thought feasibly treated with a mitral transcatheter edge-to-edge repair.  Today she reports for the scheduled procedure.  Her shortness of breath is relatively unchanged versus previously.  She endorses [place] heart association class II-III symptoms.   Past Medical History:  Diagnosis Date   Anxiety    Aortic atherosclerosis (HCC)    Arthritis    Asthma    Atrial fibrillation, permanent ([place])    Rate control with Bystolic. CHA2DS2Vasc = 6 (HTN, DM, CHF, Age 26, Female) -> on Pradaxa   Breast cancer ([place]) 2008   S/P mastectomy   CAD S/P percutaneous coronary angioplasty 2006   PCI of circumflex with Taxus DES;; relook-cath Feb 2013: 50-60% short lesion in RCA, 40% ISR Circumflex stent.;  06/2021: Patent LCx stent.  Distal LCx 90% stenosis, proximal-mid RCA 60%.   CHF (congestive heart failure), NYHA class II, chronic, combined ([place])    CKD (chronic kidney disease), stage III (HCC)    Complication of anesthesia    prolonged sedation after colonoscopy in [place]   Diabetes mellitus, uncontrolled 07/14/2011   Dilated  cardiomyopathy ([place]) 06/2021   2019: EF down to 25% improved to 50 to 55% x 2020. => Recurrent cardiomyopathy 06/2021 => EF 25 to 30% on Echo, CMR showed severe BiV failure (LVEF 36%, diffuse HK; RVEF 32%).  Severe biatrial enlargement.  Moderate MR posterior directed.  Suspect functional (suggested by TEE); noncoronary pattern for delayed gadolinium-suggest myocarditis, & prior Ant MI-subendocardial => Mixed cardiomyopathy   Fibroid, uterine 07/13/2011   "have that now"   GERD (gastroesophageal reflux disease)    Gout    Hyperlipidemia    Hypertension    Hypokalemia    Morbid obesity ([place])    BMI 41   OSA on CPAP    On CPAP   Pneumonia 2018   Pulmonary hypertension, unspecified (HCC)    PAP ~90 mmHg on Echo 12/2017 -- has OSA on CPAP & obesity -> follow-up sleep study showed mild OSA.;  09/22/2021: RHC showed PAP of 86/30 mmHg with LVEDP of 14 mmHg.   Severe mitral regurgitation     Past Surgical History:  Procedure Laterality Date   Cardiac MRI  06/24/2021   Severe biventricular failure: LVEF 36%-diffuse HK. RV EF 32%. Severe BiA Enlargement. Mod MR. LGE in septum - non-coronary pattern -? Myocarditis. Anterior wall LGE is subendocardial - ? Prior MI.   COLONOSCOPY     CORONARY ANGIOPLASTY WITH STENT PLACEMENT  2006   stent to circumflex   DILATION AND CURETTAGE OF UTERUS     LEFT HEART CATHETERIZATION WITH CORONARY ANGIOGRAM N/A 07/13/2011   Procedure: LEFT HEART CATHETERIZATION WITH CORONARY ANGIOGRAM;  Surgeon: Shanon Brow  Loren Racer, MD;  Location: [facility] C[facility] LAB;  Service: Cardiovascular;  normal EF; 91m 50-60% lesion in RCA; patent stent in circumflex form 2006 with ~40% in-stent re-stenosis   MASTECTOMY  2001   left   NM MYOVIEW LTD  7/'16; 6/'21   LOW RISK. No ischemia or infarction. Not gated 2/2  A. fib - unable to assess EF   RIGHT HEART CATH N/A 10/22/2021   Procedure: RIGHT HEART CATH;  Surgeon: MLarey Dresser MD;  [facility];  Service: Cardiovascular;   Laterality: N/A;   RIGHT/LEFT HEART CATH AND CORONARY ANGIOGRAPHY N/A 06/23/2021   Procedure: RIGHT/LEFT HEART CATH AND CORONARY ANGIOGRAPHY;  Surgeon: BLorretta Harp MD;  [facility];  Service: Cardiovascular; p-mRCA 60%.  Distal LCx 90%. mLAD stent patent.  RAP 22 RVP 70/1 mmHg with PAP 80/30 mmHg. LVEDP 14 mmHg-unable to assess PCWP.   TEE WITHOUT CARDIOVERSION N/A 06/25/2021   Procedure: TRANSESOPHAGEAL ECHOCARDIOGRAM (TEE);  Surgeon: MLarey Dresser MD;  [facility]; EF 35 to 40% with global HK.  Mildly reduced RV function.  Severely dilated left atrium with no LAA thrombus.  ?  Mild RA dilation.  Posterior MVL restriction w/ moderate eccentric/posterior directed MR.  No PV reversal. ~ functional MR 2/2 Afib.  Mild dilation of the AscAo (~ 40 mm)   TEE WITHOUT CARDIOVERSION N/A 10/22/2021   Procedure: TRANSESOPHAGEAL ECHOCARDIOGRAM (TEE);  Surgeon: MLarey Dresser MD;  [facility];  Service: Cardiovascular;  Laterality: N/A;   TOTAL HIP ARTHROPLASTY Right 01/19/2018   Procedure: RIGHT TOTAL HIP ARTHROPLASTY ANTERIOR APPROACH;  Surgeon: SRod Can MD;  Location: [facility] ORS;  Service: Orthopedics;  Laterality: Right;  Needs RNFA   TRANSTHORACIC ECHOCARDIOGRAM  6/'15; 9/'16   a) In setting of Urosepsis: EF ~25 %, global HK (poor quality study);; b) LV EF 40-45%, Mild Anteroseptal HK. Mod-Severe  RA dilation with elevated PA Pressures (~~peak 63 mmHg).. Mild LA dilation   TRANSTHORACIC ECHOCARDIOGRAM  12/2017; 04/15/2018   a) Improved LVEF to 45 to 50%.  Pulmonary HTN ~PAP ~ 60 mmHg. Severe LA dilation& mild /rv dilation.; b) Mod LVH. EF 40-45% (back to prior EF). Unable to assess DF (Afib). Severe R&L Atrial enlargement.  Moderate Post-Lateral directed MR with Mod RV dilation.  PAP ~66 mmHg.;    TRANSTHORACIC ECHOCARDIOGRAM  01/2019   relook Echo: EF back up to 50 to 55%.  Indeterminate diastolic parameters.  Moderately dilated left atrium and right  atrium.  Moderate MR.  Mild to moderate TR.   TRANSTHORACIC ECHOCARDIOGRAM  06/21/2021   EF 25 to 30%.  Severely reduced function.  Paradoxical septal motion consistent with LBBB.  Unable to assess diastolic pressures.  Severe biatrial dilation.  Mildly elevated PA P estimated 43.5 mmHg-with RAP estimated at 8 mmHg..  Moderate posteriorly directed MR.     Medications Prior to Admission: Prior to Admission medications   Medication Sig Start Date End Date Taking? Authorizing Provider  acetaminophen (TYLENOL) 500 MG tablet Take 1,000 mg by mouth every 6 (six) hours as needed for mild pain.   Yes [provider]  albuterol (PROVENTIL HFA;VENTOLIN HFA) 108 (90 Base) MCG/ACT inhaler Inhale 1-2 puffs into the lungs every 6 (six) hours as needed for wheezing or shortness of breath. 05/20/15  Yes NHarvel Quale MD  allopurinol (ZYLOPRIM) 300 MG tablet TAKE 1 TABLET BY MOUTH EVERY DAY AS NEEDED Patient taking differently: Take 300 mg by mouth every evening. 11/17/21  Yes  Irene Pap, PA-C  Biotin 1000 MCG tablet Take 1,000 mcg by mouth daily.   Yes [provider]  ELIQUIS 5 MG TABS tablet TAKE 1 TABLET BY MOUTH TWICE A DAY 07/11/19  Yes Leonie Man, MD  ezetimibe (ZETIA) 10 MG tablet Take 1 tablet (10 mg total) by mouth daily. 06/27/21  Yes Simmons, Brittainy M, PA-C  FARXIGA 10 MG TABS tablet TAKE 1 TABLET BY MOUTH DAILY 12/17/21  Yes Lyda Jester M, PA-C  gabapentin (NEURONTIN) 600 MG tablet TAKE 1 TABLET BY MOUTH THREE TIMES A DAY 12/10/21  Yes Tysinger, Camelia Eng, PA-C  hydrALAZINE (APRESOLINE) 25 MG tablet Take 0.5 tablets (12.5 mg total) by mouth 3 (three) times daily. 10/07/21  Yes Larey Dresser, MD  Hydrocortisone, Perianal, 1 % CREA Apply 1 application. topically 3 (three) times daily as needed (irritation). 09/18/21  Yes [provider]  isosorbide mononitrate (IMDUR) 60 MG 24 hr tablet Take 60 mg by mouth daily.   Yes [provider]  letrozole  (FEMARA) 2.5 MG tablet Take 1 tablet (2.5 mg total) by mouth daily. 10/02/21  Yes Owens Shark, NP  LORazepam (ATIVAN) 1 MG tablet Take 1 mg by mouth 2 (two) times daily as needed for anxiety. 07/07/16  Yes [provider]  metolazone (ZAROXOLYN) 2.5 MG tablet Take 1 tablet (2.5 mg total) by mouth once a week. On Mondays. With an extra 2 tablets of potassium 12/29/21  Yes Milford, Maricela Bo, FNP  Multiple Vitamins-Minerals (MULTIVITAMIN WITH MINERALS) tablet Take 1 tablet by mouth daily.   Yes [provider]  Naphazoline-Glycerin (CLEAR EYES MAX REDNESS RELIEF OP) Place 2 drops into both eyes daily as needed (redness).   Yes [provider]  nystatin (MYCOSTATIN/NYSTOP) powder Apply 1 Application topically 2 (two) times daily as needed (rash).   Yes [provider]  Oxycodone HCl 10 MG TABS Take 0.5 tablets (5 mg total) by mouth 3 (three) times daily as needed. For pain 12/24/21  Yes Owens Shark, NP  pantoprazole (PROTONIX) 40 MG tablet TAKE 1 TABLET BY MOUTH EVERY DAY 04/02/21  Yes Leonie Man, MD  potassium chloride SA (KLOR-CON M) 20 MEQ tablet Take 3 tablets by mouth in the morning and evening Patient taking differently: Take 60-80 mEq by mouth See admin instructions. Take 60 meq twice daily except on Mondays take 80 meq twice daily (with the metolazone) 12/16/21  Yes Milford, Maricela Bo, FNP  rosuvastatin (CRESTOR) 40 MG tablet Take 40 mg by mouth daily.   Yes [provider]  senna-docusate (SENOKOT-S) 8.6-50 MG tablet Take 1 tablet by mouth 2 (two) times daily. 09/26/21  Yes Lajean Manes, MD  spironolactone (ALDACTONE) 25 MG tablet Take 1 tablet (25 mg total) by mouth daily. 09/26/21  Yes Lajean Manes, MD  torsemide (DEMADEX) 20 MG tablet Take 60 mg by mouth 2 (two) times daily.   Yes [provider]  TOUJEO SOLOSTAR 300 UNIT/ML Solostar Pen Inject 30 Units into the skin 2 (two) times daily. 07/28/21  Yes [provider]   triamcinolone ointment (KENALOG) 0.5 % Apply 1 Application topically 2 (two) times daily as needed (rash/itching).   Yes [provider]  fexofenadine (ALLEGRA) 180 MG tablet Take 180 mg by mouth daily as needed for allergies.  04/30/15   [provider]  glucose blood test strip 1 each by Other route as needed for other. Use as instructed    [provider]  nitroGLYCERIN (NITROSTAT) 0.4 MG  SL tablet DISSOLVE 1 TABLET UNDER THE TONGUE EVERY 5 MINUTES AS NEEDED FOR CHEST PAIN. 07/25/21   Leonie Man, MD  ondansetron (ZOFRAN) 4 MG tablet Take 1 tablet (4 mg total) by mouth every 6 (six) hours as needed for nausea. 09/26/21   Lajean Manes, MD     Allergies:   No Known Allergies  Social History:   Social History   Socioeconomic History   Marital status: Married    Spouse name: Not on file   Number of children: 4   Years of education: Not on file   Highest education level: Not on file  Occupational History   Not on file  Tobacco Use   Smoking status: Former    Packs/day: 0.50    Years: 6.00    Total pack years: 3.00    Types: Cigarettes    Quit date: 05/18/1992    Years since quitting: 29.6   Smokeless tobacco: Never   Tobacco comments:    Former smoker 08/15/21  Vaping Use   Vaping Use: Never used  Substance and Sexual Activity   Alcohol use: No    Comment: 07/13/11 "have drank occasionally; not now"   Drug use: No   Sexual activity: Not Currently    Birth control/protection: Surgical  Other Topics Concern   Not on file  Social History Narrative   She is a married mother of 21, grandmother 2. Usually accompanied by her husband. She does not work. She had been working on her exercise, but is no longer as active. Does not drink and does not smoke   Social Determinants of Radio broadcast assistant Strain: Not on file  Food Insecurity: Not on file  Transportation Needs: Not on file  Physical Activity: Not on file  Stress: Not on file  Social  Connections: Not on file  Intimate Partner Violence: Not on file    Family History:   The patient's family history includes Atrial fibrillation in her son; Coronary artery disease in her mother; Healthy in her daughter, daughter, and son; Heart disease in her brother, brother, and father; Hypertension in her mother; Lung cancer in her sister; Thyroid disease in her daughter.    ROS:  Please see the history of present illness.  All other ROS reviewed and negative.     Physical Exam/Data:   Vitals:   01/11/2022 0902  BP: 112/73  Pulse: 100  Resp: 18  Temp: 98.6 F (37 C)  TempSrc: Oral  SpO2: 95%  Weight: 81.2 kg  Height: '5\' 3"'$  (1.6 m)   No intake or output data in the 24 hours ending 12/22/2021 1032    01/10/2022    9:02 AM 01/07/2022   10:34 AM 01/05/2022    8:59 AM  Last 3 Weights  Weight (lbs) 179 lb 183 lb 9.6 oz 172 lb 14.4 oz  Weight (kg) 81.194 kg 83.28 kg 78.427 kg     Body mass index is 31.71 kg/m.  General:  Well nourished, well developed, in no acute distress HEENT: normal Neck: no JVD Vascular: No carotid bruits; Distal pulses 2+ bilaterally   Cardiac:  normal S1, S2; regular rate and rhythm with 3 out of 6 holosystolic murmur Lungs:  clear to auscultation bilaterally, no wheezing, rhonchi or rales  Abd: soft, nontender, no hepatomegaly  Ext: no edema Musculoskeletal:  No deformities, BUE and BLE strength normal and equal Skin: warm and dry  Neuro:  CNs 2-12 intact, no focal abnormalities noted Psych:  Normal affect  EKG:  The ECG that was done January 05, 2022 was personally reviewed and demonstrates atrial fibrillation with rapid ventricular rate  Relevant CV Studies:  2D ECHO: May 2023 ejection fraction of 30 to 35% with global hypokinesis and moderate to severe eccentric mitral vegetation with mild tricuspid regurgitation with relatively preserved RV function   TEE: June 2023 demonstrates an ejection fraction of 30 to 35% with global hypokinesis.  The  posterior leaflet is restricted in severe eccentric mitral regurgitation is seen.  The posterior leaflet measures 2 around 1 cm and the mitral valve area is around 4.79 cm with a mean gradient 2 mmHg.  Moderate to severe TR is noted   CARDIAC CATH: February 2023 demonstrates moderate obstructive coronary artery disease as detailed above; right heart catheterization June 2023 with preserved cardiac output and index with elevated filling pressures and pulmonary arterial hypertension with pulmonary vascular resistance of 6 Woods units  Laboratory Data:  High Sensitivity Troponin:  No results for input(s): "TROPONINIHS" in the last 720 hours.    Chemistry Recent Labs  Lab 01/05/22 1040  NA 135  K 3.6  CL 93*  CO2 29  GLUCOSE 108*  BUN 45*  CREATININE 2.44*  CALCIUM 9.6  GFRNONAA 21*  ANIONGAP 13    Recent Labs  Lab 01/05/22 1040  PROT 7.6  ALBUMIN 3.4*  AST 30  ALT 25  ALKPHOS 75  BILITOT 0.8   Lipids No results for input(s): "CHOL", "TRIG", "HDL", "LABVLDL", "LDLCALC", "CHOLHDL" in the last 168 hours. Hematology Recent Labs  Lab 01/05/22 1040  WBC 8.3  RBC 3.94  HGB 10.3*  HCT 34.3*  MCV 87.1  MCH 26.1  MCHC 30.0  RDW 19.3*  PLT 233   Thyroid No results for input(s): "TSH", "FREET4" in the last 168 hours. BNPNo results for input(s): "BNP", "PROBNP" in the last 168 hours.  DDimer No results for input(s): "DDIMER" in the last 168 hours.   Radiology/Studies:  No results found.   Assessment and Plan:   Severe symptomatic mitral regurgitation with [place] heart association class II-III symptoms: We will proceed to mitral transcatheter edge-to-edge repair today. Permanent atrial fibrillation: We will resume apixaban tomorrow likely with Plavix as an antiplatelet for the clip procedure. Nonischemic cardiomyopathy: This is being managed by the advanced heart failure division.  Hopefully with a good result her medical therapy can be uptitrated.   Risk  Assessment/Risk Scores:       New York Heart Association (NYHA) Functional Class NYHA Class III  CHA2DS2-VASc Score = 6   This indicates a 9.7% annual risk of stroke. The patient's score is based upon: CHF History: 1 HTN History: 1 Diabetes History: 1 Stroke History: 0 Vascular Disease History: 1 Age Score: 1 Gender Score: 1      Severity of Illness: The appropriate patient status for this patient is INPATIENT. Inpatient status is judged to be reasonable and necessary in order to provide the required intensity of service to ensure the patient's safety. The patient's presenting symptoms, physical exam findings, and initial radiographic and laboratory data in the context of their chronic comorbidities is felt to place them at high risk for further clinical deterioration. Furthermore, it is not anticipated that the patient will be medically stable for discharge from the hospital within 2 midnights of admission.   * I certify that at the point of admission it is my clinical judgment that the patient will require inpatient hospital care spanning beyond 2 midnights from the point of  admission due to high intensity of service, high risk for further deterioration and high frequency of surveillance required.*   For questions or updates, please contact [place] Please consult [email or web address] for contact info under     Signed, Early Osmond, MD  01/04/2022 10:32 AM

## 2022-01-08 NOTE — Plan of Care (Signed)

## 2022-01-08 NOTE — Op Note (Signed)
[facility]   MULTIDISCIPLINARY HEART TEAM  Date of Procedure:  12/24/2021  Preoperative Diagnosis: Severe Symptomatic Mitral Regurgitation (Stage D)  Postoperative Diagnosis: Same   Procedure Performed: Ultrasound-guided right transfemoral venous access Double PreClose right femoral vein Transseptal puncture using Bailess RF needle Aborted mitral transcatheter edge-to-edge repair due to failure to cross the septum despite repeated balloon septostomy  Surgeon: Lenna Sciara, MD  Co-Surgeon: Sherren Mocha, MD  Echocardiographer: Sanda Klein, MD  Anesthesiologist: Albertha Ghee, MD  Procedural Indication: Severe Non-rheumatic Mitral Regurgitation (Stage D)   Brief History: The patient is a 71 year old female with a history of permanent atrial fibrillation on apixaban, coronary artery disease status post remote PCI of the mid LAD, nonischemic cardiomyopathy with an ejection fraction of 30 to 35%, chronic kidney disease stage III, hypertension, hyperlipidemia, type 2 diabetes, and severe mitral regurgitation who was referred for mitral transcatheter edge-to-edge repair.  Echo Findings: Preop:  Severe LV dysfunction Severe MR secondary to severe LV dysfunction, Grade 4+ Post-op:  Severe LV dysfunction Unchanged severe mitral regurgitation  Procedural Details: Prep The patient is brought to the cardiac catheterization lab in the fasting state. General anesthesia is induced. The patient is prepped from the groin to chin. A foley catheter is placed. Hemodynamics are monitored via a radial artery line.   Venous Access Using ultrasound guidance, the right femoral vein is punctured. Ultrasound images are captured and stored in the patient's chart. The vein is dilated and 2 Perclose devices are deplyed at 10' and 2' positions to 'Preclose' the femoral vein. An 8 Fr sheath is inserted.  Transseptal Puncture A Baylis Versacross wire is advanced into the SVC A Baylis  transseptal dilator is advanced into the SVC, and the VersaCross RF wire is retracted into the dilator  The transseptal sheath is retracted into the RA under fluoroscopic and echo guidance to obtain position on the posterior fossa where echo measurements are made to assure appropriate access to the mitral valve. Once proper position is confirmed by echo, RF energy is delivered and the VersaCross wire is advanced into the LA without resistance. The dilator and sheath are advanced over the wire where proper position is confirmed by echo and pressure measurement Weight based IV heparin is administered and a therapeutic ACT > 250 is confirmed  Steerable Guide Catheter Insertion The VersaCross wire is positioned at the left upper pulmonary vein The femoral vein is progressively dilated and the 24 Fr Steerable guide catheter was advanced to the septum.  Unfortunately the steerable guide would not cross the septum.  We removed the steerable guide and placed a 24 French dry seal sheath.  We eventually traded the wire out for a safari wire and pursued balloon septostomy with a 10 mm balloon.  We were still unable to advance the steerable guide across the septum.  An Agilis catheter was used to gain access to the left ventricle.  A Lunderquist wire was placed in the left ventricle and a 12 mm balloon was used for septostomy.  We were unable to advance the steerable guide catheter through the septum.  We then tried septostomy with a 18 mm true balloon and again the guide would not pass through the septum.  At this point in time given radiation expenditure and length of case we elected to defer any further attempts at crossing the septum.  TEE assessment demonstrated a left to right shunt with no evidence of pericardial effusion. Hemostasis Hemostasis was obtained by cinching the Perclose and placing  a figure of 8 stitch.  Due to ongoing mild oozing from the site a FemoStop was placed.  Estimated blood loss:  minimal  There are no immediate procedural complications. The patient is transferred to the post-procedure recovery area in stable condition.   Early Osmond 12/16/2021 4:39 PM

## 2022-01-08 NOTE — Significant Event (Signed)
Rapid Response Event Note   Reason for Call :  Bleeding from R groin cath site.  Initial Focused Assessment:  Pt lying in bed with eyes closed. She is alert and oriented, c/o pain in R groin site while pressure being held. Site oozing blood on assessment with small hematoma medial to site. Hematoma marked. Pressure held additional 10 minutes until no more oozing from site and hematoma appears to not be getting any bigger.   T-98.5, HR-140s(Afib), BP-114/76, RR-26, SpO2-94% on 2L .    Interventions:  Pressure held x 30 min CBC STAT Plan of Care:  Bleeding appears to have stopped. Await CBC results. Instructed pt to keep R leg straight and remain on bedrest. Frequent BPs and site assessments while awaiting MD instruction. Monitor closely. Call RRT if further assistance needed.   Event Summary:   MD Notified: Dr. Humphrey Rolls paged, RN to await response Call [place], Stephanie Anagnos Anderson, RN

## 2022-01-08 NOTE — Progress Notes (Signed)
Hypoglycemic Event  CBG: 56  Treatment: D50 25 mL (12.5 gm)  Symptoms:  sleepy  Follow-up CBG: Time:   CBG Result:  Possible Reasons for Event: Inadequate meal intake  Comments/MD notified:Dr. Hodierne notified and 1/2 amp dextrose ordered    Stephanie Franco

## 2022-01-08 NOTE — Telephone Encounter (Signed)
-----   Message from Ladell Pier, MD sent at 12/25/2021  1:59 PM EDT ----- Please call patient, the x-ray reveals areas of cancer involving the pelvic bones and femur, no fracture, call for increased right leg pain

## 2022-01-08 NOTE — Progress Notes (Signed)
Dr Ali Lowe in to see patient and explain the results of the procedure.  Back pain scale at level 7 at this time after Morphine.

## 2022-01-09 ENCOUNTER — Encounter (HOSPITAL_COMMUNITY): Payer: Self-pay | Admitting: Internal Medicine

## 2022-01-09 DIAGNOSIS — I5043 Acute on chronic combined systolic (congestive) and diastolic (congestive) heart failure: Secondary | ICD-10-CM

## 2022-01-09 DIAGNOSIS — N184 Chronic kidney disease, stage 4 (severe): Secondary | ICD-10-CM | POA: Diagnosis not present

## 2022-01-09 DIAGNOSIS — I482 Chronic atrial fibrillation, unspecified: Secondary | ICD-10-CM

## 2022-01-09 DIAGNOSIS — I340 Nonrheumatic mitral (valve) insufficiency: Secondary | ICD-10-CM | POA: Diagnosis not present

## 2022-01-09 LAB — BASIC METABOLIC PANEL
Anion gap: 7 (ref 5–15)
BUN: 44 mg/dL — ABNORMAL HIGH (ref 8–23)
CO2: 24 mmol/L (ref 22–32)
Calcium: 8.4 mg/dL — ABNORMAL LOW (ref 8.9–10.3)
Chloride: 106 mmol/L (ref 98–111)
Creatinine, Ser: 2.47 mg/dL — ABNORMAL HIGH (ref 0.44–1.00)
GFR, Estimated: 20 mL/min — ABNORMAL LOW (ref >60)
Glucose, Bld: 153 mg/dL — ABNORMAL HIGH (ref 70–99)
Potassium: 3.8 mmol/L (ref 3.5–5.1)
Sodium: 137 mmol/L (ref 135–145)

## 2022-01-09 LAB — GLUCOSE, CAPILLARY
Glucose-Capillary: 155 mg/dL — ABNORMAL HIGH (ref 70–99)
Glucose-Capillary: 107 mg/dL — ABNORMAL HIGH (ref 70–99)
Glucose-Capillary: 164 mg/dL — ABNORMAL HIGH (ref 70–99)
Glucose-Capillary: 213 mg/dL — ABNORMAL HIGH (ref 70–99)

## 2022-01-09 MED ORDER — METOPROLOL SUCCINATE ER 25 MG PO TB24
25.0000 mg | ORAL_TABLET | Freq: Two times a day (BID) | ORAL | Status: DC
  Administered 2022-01-10: 25 mg via ORAL
  Filled 2022-01-09: qty 1

## 2022-01-09 MED ORDER — METOPROLOL SUCCINATE ER 25 MG PO TB24
25.0000 mg | ORAL_TABLET | Freq: Every day | ORAL | Status: DC
  Administered 2022-01-09: 25 mg via ORAL
  Filled 2022-01-09: qty 1

## 2022-01-09 MED ORDER — POTASSIUM CHLORIDE CRYS ER 20 MEQ PO TBCR
40.0000 meq | EXTENDED_RELEASE_TABLET | Freq: Once | ORAL | Status: AC
  Administered 2022-01-09: 40 meq via ORAL
  Filled 2022-01-09: qty 2

## 2022-01-09 MED ORDER — FUROSEMIDE 10 MG/ML IJ SOLN
60.0000 mg | Freq: Two times a day (BID) | INTRAMUSCULAR | Status: DC
Start: 2022-01-09 — End: 2022-01-10
  Administered 2022-01-09 – 2022-01-10 (×2): 60 mg via INTRAVENOUS
  Filled 2022-01-09 (×2): qty 6

## 2022-01-09 MED ORDER — FUROSEMIDE 10 MG/ML IJ SOLN
40.0000 mg | Freq: Once | INTRAMUSCULAR | Status: AC
  Administered 2022-01-09: 40 mg via INTRAVENOUS
  Filled 2022-01-09: qty 4

## 2022-01-09 NOTE — Progress Notes (Addendum)
[place] VALVE TEAM  Patient Name: Stephanie Franco Date of Encounter: 01/09/2022  Admit date: 12/31/2021  Primary Care Provider: Carlena Hurl, PA-C [facility] Cardiologist: Glenetta Hew, MD  [facility] Electrophysiologist:  None   [facility] Problem List     Principal Problem:   Severe mitral regurgitation Active Problems:   Essential hypertension   Morbid obesity ([place])   Hyperlipidemia associated with type 2 diabetes mellitus ([place])   Obstructive sleep apnea syndrome   Diabetes mellitus ([place])   CAD S/P percutaneous coronary angioplasty   Permanent atrial fibrillation: CHA2DS2-VASc Score 6   Chronic kidney disease (CKD), stage III (moderate) (HCC)   Chronic combined systolic and diastolic CHF, NYHA class 2 and ACC/AHA stage C (HCC)   Dilated cardiomyopathy ([place])   Pulmonary hypertension (HCC)   GERD (gastroesophageal reflux disease)   Breast cancer metastasized to bone [facility])   Mitral regurgitation     Subjective   Husband at bedside. No complaints. Sitting up in chair.  Inpatient Medications    Scheduled Meds:  allopurinol  300 mg Oral QPM   apixaban  5 mg Oral BID   dapagliflozin propanediol  10 mg Oral Daily   ezetimibe  10 mg Oral Daily   gabapentin  600 mg Oral TID   hydrALAZINE  12.5 mg Oral TID   insulin aspart  0-15 Units Subcutaneous TID WC   insulin aspart  0-5 Units Subcutaneous QHS   insulin glargine  30 Units Subcutaneous Q2000   isosorbide mononitrate  60 mg Oral Daily   letrozole  2.5 mg Oral Daily   metoprolol succinate  25 mg Oral BID   multivitamin with minerals  1 tablet Oral Daily   pantoprazole  40 mg Oral Daily   rosuvastatin  40 mg Oral Daily   senna-docusate  1 tablet Oral BID   sodium chloride flush  3 mL Intravenous Q12H   spironolactone  25 mg Oral Daily   torsemide  60 mg Oral BID   Continuous Infusions:  sodium chloride     PRN Meds: sodium chloride, acetaminophen,  albuterol, loratadine, LORazepam, naphazoline-glycerin, ondansetron (ZOFRAN) IV, oxyCODONE, sodium chloride flush   Vital Signs    Vitals:   01/09/22 0000 01/09/22 0349 01/09/22 0729 01/09/22 1100  BP: 101/75 1'04/72 96/77 90/71 '$  Pulse: (!) 131 (!) 121 (!) 122 100  Resp: '18 19 20 17  '$ Temp:  97.9 F (36.6 C) 98.6 F (37 C) 98.6 F (37 C)  TempSrc:  Oral Axillary Oral  SpO2: 96% 96% 95% 94%  Weight:      Height:        Intake/Output Summary (Last 24 hours) at 01/09/2022 1331 Last data filed at 01/09/2022 1100 Gross per 24 hour  Intake 1716.88 ml  Output 150 ml  Net 1566.88 ml   Filed Weights   12/22/2021 0902  Weight: 81.2 kg    Physical Exam    GEN: Well nourished, well developed, in no acute distress.  HEENT: Grossly normal.  Neck: Supple, no JVD, carotid bruits, or masses. Cardiac: irreg irreg, tachy, 3/6 holosystolic murmur at apex. No rubs, or gallops. No clubbing, cyanosis, 2+ pitting edema, bilaterally.   Respiratory: decreased breath sounds at bases.  GI: Soft, nontender, nondistended, BS + x 4. MS: no deformity or atrophy. Skin: warm and dry, no rash. Neuro:  Strength and sensation are intact. Psych: AAOx3.  Normal affect.  Labs    CBC Recent Labs    12/28/2021  2049  WBC 10.0  HGB 9.2*  HCT 29.5*  MCV 85.3  PLT 315   Basic Metabolic Panel Recent Labs    01/09/22 0034  NA 137  K 3.8  CL 106  CO2 24  GLUCOSE 153*  BUN 44*  CREATININE 2.47*  CALCIUM 8.4*   Liver Function Tests No results for input(s): "AST", "ALT", "ALKPHOS", "BILITOT", "PROT", "ALBUMIN" in the last 72 hours. No results for input(s): "LIPASE", "AMYLASE" in the last 72 hours. Cardiac Enzymes No results for input(s): "CKTOTAL", "CKMB", "CKMBINDEX", "TROPONINI" in the last 72 hours. BNP Invalid input(s): "POCBNP" D-Dimer No results for input(s): "DDIMER" in the last 72 hours. Hemoglobin A1C No results for input(s): "HGBA1C" in the last 72 hours. Fasting Lipid Panel No results  for input(s): "CHOL", "HDL", "LDLCALC", "TRIG", "CHOLHDL", "LDLDIRECT" in the last 72 hours. Thyroid Function Tests No results for input(s): "TSH", "T4TOTAL", "T3FREE", "THYROIDAB" in the last 72 hours.  Invalid input(s): "FREET3"  Telemetry    Afib with RVR - Personally Reviewed  ECG    Aifb with RVR, LVH with repol abnormality HR 106  - Personally Reviewed  Radiology    CARDIAC CATHETERIZATION  Addendum Date: 01/09/2022   1.  Aborted mitral edge-to-edge repair due to failure to advance steerable guide catheter across the septum despite repeated balloon septostomy with balloons of incrementally increasing diameter; a future attempt with a priori buddy wire and arterial venous rail for improved support will be considered.  Result Date: 01/09/2022 1.  Aborted mitral edge-to-edge repair due to failure to advance steerable guide catheter across the septum despite repeated balloon septostomy with balloons of incrementally increasing diameter; a future attempt with high priority arterial venous rail for improved support may be considered.   ECHO TEE  Result Date: 01/09/2022    TRANSESOPHOGEAL ECHO REPORT   Patient Name:   Stephanie Franco Date of Exam: 01/10/2022 Medical Rec #:  [id number]        Height:       63.0 in Accession #:    [id number]       Weight:       179.0 lb Date of Birth:  02-24-1951        BSA:          1.845 m Patient Age:    71 years         BP:           112/73 mmHg Patient Gender: F                HR:           100 bpm. Exam Location:  Inpatient Procedure: Transesophageal Echo, 3D Echo, Color Doppler and Cardiac Doppler Indications:     I34.0 Nonrheumatic mitral (valve) insufficiency  History:         Patient has prior history of Echocardiogram examinations, most                  recent 10/22/2021. CHF, CAD, Pulmonary HTN, Arrythmias:Atrial                  Fibrillation; Risk Factors:Hypertension, Diabetes, Dyslipidemia                  and Sleep Apnea.  Sonographer:     Raquel Sarna  Senior RDCS Referring Phys:  [id number] Early Osmond Diagnosing Phys: Sanda Klein MD PROCEDURE: After discussion of the risks and benefits of a TEE, an informed consent was obtained from the patient. The transesophogeal  probe was passed without difficulty through the esophogus of the patient. Sedation performed by different physician. The patient was monitored while under deep sedation. The patient developed no complications during the procedure. IMPRESSIONS  1. Left ventricular ejection fraction, by estimation, is 25 to 30%. The left ventricle has severely decreased function. The left ventricle demonstrates global hypokinesis. Left ventricular diastolic function could not be evaluated. There is the interventricular septum is flattened in systole and diastole, consistent with right ventricular pressure and volume overload.  2. Right ventricular systolic function is mildly reduced. The right ventricular size is mildly enlarged. There is moderately elevated pulmonary artery systolic pressure. The estimated right ventricular systolic pressure is 49.6 mmHg.  3. There is no ASD at baseline. After atrial septal puncture and balloon dilation atrial septostomy there is an iatrogenic ASD, 4x7 mm. Left atrial size was severely dilated. No left atrial/left atrial appendage thrombus was detected.  4. Right atrial size was severely dilated.  5. There is severe tethering of the posterior mitral leaflet due to annulus and chamber remodeling. The mitral valve is abnormal. Severe mitral valve regurgitation. No evidence of mitral stenosis.  6. Tricuspid valve regurgitation is moderate to severe.  7. The aortic valve is tricuspid. Aortic valve regurgitation is not visualized. No aortic stenosis is present.  8. There is a small secundum atrial septal defect with predominantly left to right shunting across the atrial septum. FINDINGS  Left Ventricle: Left ventricular ejection fraction, by estimation, is 25 to 30%. The left ventricle  has severely decreased function. The left ventricle demonstrates global hypokinesis. The left ventricular internal cavity size was normal in size. There is no left ventricular hypertrophy. The interventricular septum is flattened in systole and diastole, consistent with right ventricular pressure and volume overload. Left ventricular diastolic function could not be evaluated due to atrial fibrillation. Left ventricular diastolic function could not be evaluated. Right Ventricle: The right ventricular size is mildly enlarged. No increase in right ventricular wall thickness. Right ventricular systolic function is mildly reduced. There is moderately elevated pulmonary artery systolic pressure. The tricuspid regurgitant velocity is 3.10 m/s, and with an assumed right atrial pressure of 10 mmHg, the estimated right ventricular systolic pressure is 75.9 mmHg. Left Atrium: There is no ASD at baseline. After atrial septal puncture and balloon dilation atrial septostomy there is an iatrogenic ASD, 4x7 mm. Left atrial size was severely dilated. No left atrial/left atrial appendage thrombus was detected. Right Atrium: Right atrial size was severely dilated. Pericardium: There is no evidence of pericardial effusion. Mitral Valve: There is severe tethering of the posterior mitral leaflet due to annulus and chamber remodeling. The mitral valve is abnormal. Severe mitral valve regurgitation, with eccentric posteriorly directed jet. No evidence of mitral valve stenosis.  MV peak gradient, 3.9 mmHg. The mean mitral valve gradient is 2.0 mmHg. Tricuspid Valve: The tricuspid valve is normal in structure. Tricuspid valve regurgitation is moderate to severe. Aortic Valve: The aortic valve is tricuspid. Aortic valve regurgitation is not visualized. No aortic stenosis is present. Pulmonic Valve: The pulmonic valve was normal in structure. Pulmonic valve regurgitation is not visualized. Aorta: The aortic root, ascending aorta, aortic arch  and descending aorta are all structurally normal, with no evidence of dilitation or obstruction. IAS/Shunts: There is a small secundum atrial septal defect with predominantly left to right shunting across the atrial septum.  MITRAL VALVE                  TRICUSPID VALVE MV Peak  grad: 3.9 mmHg        TR Peak grad:   38.4 mmHg MV Mean grad: 2.0 mmHg        TR Vmax:        310.00 cm/s MV Vmax:      0.99 m/s MV Vmean:     62.5 cm/s MR Peak grad:    70.2 mmHg MR Mean grad:    48.0 mmHg MR Vmax:         419.00 cm/s MR Vmean:        332.0 cm/s MR PISA:         3.08 cm MR PISA Eff ROA: 20 mm MR PISA Radius:  0.70 cm Mihai Croitoru MD Electronically signed by Sanda Klein MD Signature Date/Time: 12/24/2021/3:18:48 PM    Final     Cardiac Studies   [facility]   MULTIDISCIPLINARY HEART TEAM   Date of Procedure:                12/27/2021   Preoperative Diagnosis:      Severe Symptomatic Mitral Regurgitation (Stage D)   Postoperative Diagnosis:    Same    Procedure Performed: Ultrasound-guided right transfemoral venous access Double PreClose right femoral vein Transseptal puncture using Bailess RF needle Aborted mitral transcatheter edge-to-edge repair due to failure to cross the septum despite repeated balloon septostomy   Surgeon: Lenna Sciara, MD  Co-Surgeon: Sherren Mocha, MD   Echocardiographer: Sanda Klein, MD   Anesthesiologist: Albertha Ghee, MD   Procedural Indication: Severe Non-rheumatic Mitral Regurgitation (Stage D)    _____________  Patient Profile     Stephanie Franco is a 71 y.o. female with a history of permanent atrial fibrillation on apixaban, CAD s/p remote PCI of the mid LAD, NICM with an EF of 30 to 35%, CKD stage IIIb, HTN, HLD, DMT2, obesity, metastatic breast cancer to bone and severe mitral regurgitation who presented to [facility] on [facility]/24 for planned mTEER. Surgery was aborted due to failure to cross the septum despite repeated balloon septostomy. She was  admitted overnight for observation and noted to go into afib with RVR and be hypoxic.   Assessment & Plan    Severe MR/TR: she underwent aborted mitral transcatheter edge-to-edge repair due to failure to cross the septum despite repeated balloon septostomy.  She was admitted overnight for observation. She did have prolonged radiation exposure given length of case. The patient is interested trying mTEER again in the future if possible.    Permanent afib: she has permanent afib but developed persistently elevated rates. Previously on digoxin and amio but these were discontinued in 07/2021 due to bradycardia and junctional rhythm. Started on Toprol XL '25mg'$  BID with improvement. Resumed on home eliquis   HTN: BP on soft side. We can stop hydralazine if we need more room for HR control.    Chronic combined S/D CHF:  has 2+ pitting edema to knees. Also hypoxic to 86% on RA. Continued on torsemide '60mg'$  BID. Given one dose of IV lasix '40mg'$ . Will increase this to '60mg'$  BID since she is staying overnight.  -- Continue spiro '25mg'$  daily, Toprol XL '25mg'$  BID, hydral/nitrates, and jardiance '10mg'$ daily.    CKD stage IV: creat 2.47 with a GFR of 20 today. This is not too far from her baseline 1.94-2.44. Follow with diuresis.   DMT2: continue SSI    Signed, Angelena Form, PA-C  01/09/2022, 1:31 PM  Pager [phone]   ATTENDING ATTESTATION:  After conducting a review of  all available clinical information with the care team, interviewing the patient, and performing a physical exam, I agree with the findings and plan described in this note.   GEN: No acute distress.   HEENT:  MMM, no JVD, no scleral icterus Cardiac: RRR, no murmurs, rubs, or gallops.  Respiratory: Clear to auscultation bilaterally. GI: Soft, nontender, non-distended  MS: +2 edema; No deformity. Neuro:  Nonfocal  Vasc:  +2 radial pulses  Patient remained stable after aborted mitral transcatheter edge-to-edge repair due to inability to  cross septum despite vigorous and repeated balloon septostomy with balloons of incrementally increasing size.  She is volume overloaded today on examination and did receive quite a bit of fluids during the procedure.  We will keep in house and diuresis with IV Lasix with planned discharge over the weekend.  Additionally her atrial fibrillation rate was above 115 bpm this morning and Toprol twice daily was initiated.  I did speak with the patient and her husband about repeat attempt at mitral transcatheter edge-to-edge repair with some alternative techniques for septal crossing.  We we will plan on pursuing this in 3 to 4 weeks.  Lenna Sciara, MD Pager [phone]

## 2022-01-09 NOTE — Anesthesia Postprocedure Evaluation (Signed)
Anesthesia Post Note  Patient: Stephanie Franco  Procedure(s) Performed: MITRAL VALVE REPAIR TRANSESOPHAGEAL ECHOCARDIOGRAM (TEE)     Patient location during evaluation: PACU Anesthesia Type: General Level of consciousness: awake and alert Pain management: pain level controlled Vital Signs Assessment: post-procedure vital signs reviewed and stable Respiratory status: spontaneous breathing, nonlabored ventilation, respiratory function stable and patient connected to nasal cannula oxygen Cardiovascular status: blood pressure returned to baseline and stable Postop Assessment: no apparent nausea or vomiting Anesthetic complications: yes   Encounter Notable Events  Notable Event Outcome Phase Comment  Difficult to intubate - unexpected  Intraprocedure Filed from anesthesia note documentation.    Last Vitals:  Vitals:   01/09/22 0000 01/09/22 0349  BP: 101/75 104/72  Pulse: (!) 131 (!) 121  Resp: 18 19  Temp:  36.6 C  SpO2: 96% 96%    Last Pain:  Vitals:   01/09/22 0349  TempSrc: Oral  PainSc:                  [place]

## 2022-01-09 NOTE — TOC Progression Note (Signed)
Transition of Care [facility]) - Progression Note    Patient Details  Name: Stephanie Franco MRN: [id number] Date of Birth: 04/01/1951  Transition of Care [facility] CM/SW [place], RN Phone Number:[phone]  01/09/2022, 1:36 PM  Clinical Narrative:     Transition of Care [facility]) Screening Note   Patient Details  Name: Stephanie Franco Date of Birth: [birth year]   Transition of Care [facility]) CM/SW Contact:    Angelita Ingles, RN Phone Number: 01/09/2022, 1:37 PM    Transition of Care Department [facility]) has reviewed patient and no TOC needs have been identified at this time. We will continue to monitor patient advancement through interdisciplinary progression rounds.          Expected Discharge Plan and Services                                                 Social Determinants of Health (SDOH) Interventions    Readmission Risk Interventions     No data to display

## 2022-01-10 ENCOUNTER — Inpatient Hospital Stay (HOSPITAL_COMMUNITY): Payer: Medicare Other

## 2022-01-10 ENCOUNTER — Encounter (HOSPITAL_COMMUNITY): Payer: Self-pay | Admitting: Internal Medicine

## 2022-01-10 DIAGNOSIS — I340 Nonrheumatic mitral (valve) insufficiency: Secondary | ICD-10-CM | POA: Diagnosis not present

## 2022-01-10 DIAGNOSIS — I469 Cardiac arrest, cause unspecified: Secondary | ICD-10-CM | POA: Diagnosis not present

## 2022-01-10 DIAGNOSIS — I130 Hypertensive heart and chronic kidney disease with heart failure and stage 1 through stage 4 chronic kidney disease, or unspecified chronic kidney disease: Secondary | ICD-10-CM | POA: Diagnosis not present

## 2022-01-10 DIAGNOSIS — I5043 Acute on chronic combined systolic (congestive) and diastolic (congestive) heart failure: Secondary | ICD-10-CM | POA: Diagnosis not present

## 2022-01-10 DIAGNOSIS — I4821 Permanent atrial fibrillation: Secondary | ICD-10-CM

## 2022-01-10 LAB — POCT I-STAT 7, (LYTES, BLD GAS, ICA,H+H)
Acid-Base Excess: 4 mmol/L — ABNORMAL HIGH (ref 0.0–2.0)
Bicarbonate: 29.5 mmol/L — ABNORMAL HIGH (ref 20.0–28.0)
Calcium, Ion: 1.03 mmol/L — ABNORMAL LOW (ref 1.15–1.40)
HCT: 28 % — ABNORMAL LOW (ref 36.0–46.0)
Hemoglobin: 9.5 g/dL — ABNORMAL LOW (ref 12.0–15.0)
O2 Saturation: 100 %
Patient temperature: 98
Potassium: 4.8 mmol/L (ref 3.5–5.1)
Sodium: 131 mmol/L — ABNORMAL LOW (ref 135–145)
TCO2: 31 mmol/L (ref 22–32)
pCO2 arterial: 47.2 mmHg (ref 32–48)
pH, Arterial: 7.403 (ref 7.35–7.45)
pO2, Arterial: 577 mmHg — ABNORMAL HIGH (ref 83–108)

## 2022-01-10 LAB — BASIC METABOLIC PANEL
Anion gap: 13 (ref 5–15)
BUN: 52 mg/dL — ABNORMAL HIGH (ref 8–23)
CO2: 23 mmol/L (ref 22–32)
Calcium: 8.6 mg/dL — ABNORMAL LOW (ref 8.9–10.3)
Chloride: 99 mmol/L (ref 98–111)
Creatinine, Ser: 3.6 mg/dL — ABNORMAL HIGH (ref 0.44–1.00)
GFR, Estimated: 13 mL/min — ABNORMAL LOW (ref >60)
Glucose, Bld: 131 mg/dL — ABNORMAL HIGH (ref 70–99)
Potassium: 4.9 mmol/L (ref 3.5–5.1)
Sodium: 135 mmol/L (ref 135–145)

## 2022-01-10 LAB — CBC
HCT: 28.4 % — ABNORMAL LOW (ref 36.0–46.0)
HCT: 28.4 % — ABNORMAL LOW (ref 36.0–46.0)
Hemoglobin: 8.5 g/dL — ABNORMAL LOW (ref 12.0–15.0)
Hemoglobin: 8.5 g/dL — ABNORMAL LOW (ref 12.0–15.0)
MCH: 25.9 pg — ABNORMAL LOW (ref 26.0–34.0)
MCH: 26 pg (ref 26.0–34.0)
MCHC: 29.9 g/dL — ABNORMAL LOW (ref 30.0–36.0)
MCHC: 29.9 g/dL — ABNORMAL LOW (ref 30.0–36.0)
MCV: 86.6 fL (ref 80.0–100.0)
MCV: 86.9 fL (ref 80.0–100.0)
Platelets: 181 10*3/uL (ref 150–400)
Platelets: 210 10*3/uL (ref 150–400)
RBC: 3.28 MIL/uL — ABNORMAL LOW (ref 3.87–5.11)
RBC: 3.27 MIL/uL — ABNORMAL LOW (ref 3.87–5.11)
RDW: 19.3 % — ABNORMAL HIGH (ref 11.5–15.5)
RDW: 19.4 % — ABNORMAL HIGH (ref 11.5–15.5)
WBC: 8.2 10*3/uL (ref 4.0–10.5)
WBC: 10.7 10*3/uL — ABNORMAL HIGH (ref 4.0–10.5)
nRBC: 0.5 % — ABNORMAL HIGH (ref 0.0–0.2)
nRBC: 1.2 % — ABNORMAL HIGH (ref 0.0–0.2)

## 2022-01-10 LAB — COOXEMETRY PANEL
Carboxyhemoglobin: 2 % — ABNORMAL HIGH (ref 0.5–1.5)
Methemoglobin: <0.7 % (ref 0.0–1.5)
O2 Saturation: 63.2 %
Total hemoglobin: 8.7 g/dL — ABNORMAL LOW (ref 12.0–16.0)

## 2022-01-10 LAB — BLOOD GAS, ARTERIAL
Acid-base deficit: 0.6 mmol/L (ref 0.0–2.0)
Bicarbonate: 25.4 mmol/L (ref 20.0–28.0)
Drawn by: 53547
O2 Saturation: 78.9 %
Patient temperature: 37
pCO2 arterial: 46 mmHg (ref 32–48)
pH, Arterial: 7.35 (ref 7.35–7.45)
pO2, Arterial: 52 mmHg — ABNORMAL LOW (ref 83–108)

## 2022-01-10 LAB — GLUCOSE, CAPILLARY
Glucose-Capillary: 134 mg/dL — ABNORMAL HIGH (ref 70–99)
Glucose-Capillary: 136 mg/dL — ABNORMAL HIGH (ref 70–99)
Glucose-Capillary: 147 mg/dL — ABNORMAL HIGH (ref 70–99)
Glucose-Capillary: 155 mg/dL — ABNORMAL HIGH (ref 70–99)
Glucose-Capillary: 218 mg/dL — ABNORMAL HIGH (ref 70–99)

## 2022-01-10 LAB — TYPE AND SCREEN
ABO/RH(D): B POS
Antibody Screen: NEGATIVE

## 2022-01-10 LAB — LACTIC ACID, PLASMA: Lactic Acid, Venous: 1.6 mmol/L (ref 0.5–1.9)

## 2022-01-10 LAB — LIPOPROTEIN A (LPA): Lipoprotein (a): 484.5 nmol/L — ABNORMAL HIGH (ref <75.0)

## 2022-01-10 MED ORDER — POLYETHYLENE GLYCOL 3350 17 G PO PACK
17.0000 g | PACK | Freq: Every day | ORAL | Status: DC
  Administered 2022-01-11 – 2022-01-15 (×5): 17 g
  Filled 2022-01-10 (×4): qty 1

## 2022-01-10 MED ORDER — SODIUM CHLORIDE 0.9 % FOR CRRT: INTRAVENOUS_CENTRAL | Status: DC | PRN

## 2022-01-10 MED ORDER — HEPARIN (PORCINE) 25000 UT/250ML-% IV SOLN
1150.0000 [IU]/h | INTRAVENOUS | Status: DC
  Administered 2022-01-10 – 2022-01-11 (×2): 1150 [IU]/h via INTRAVENOUS
  Filled 2022-01-10: qty 250

## 2022-01-10 MED ORDER — ETOMIDATE 2 MG/ML IV SOLN
INTRAVENOUS | Status: AC
  Filled 2022-01-10: qty 20

## 2022-01-10 MED ORDER — SUCCINYLCHOLINE CHLORIDE 200 MG/10ML IV SOSY
PREFILLED_SYRINGE | INTRAVENOUS | Status: AC
  Filled 2022-01-10: qty 10

## 2022-01-10 MED ORDER — ORAL CARE MOUTH RINSE
15.0000 mL | OROMUCOSAL | Status: DC
  Administered 2022-01-11 – 2022-01-16 (×58): 15 mL via OROMUCOSAL

## 2022-01-10 MED ORDER — INSULIN ASPART 100 UNIT/ML IJ SOLN
0.0000 [IU] | INTRAMUSCULAR | Status: DC
  Administered 2022-01-10: 5 [IU] via SUBCUTANEOUS
  Administered 2022-01-10 – 2022-01-13 (×5): 2 [IU] via SUBCUTANEOUS
  Administered 2022-01-13 (×3): 3 [IU] via SUBCUTANEOUS
  Administered 2022-01-13: 2 [IU] via SUBCUTANEOUS
  Administered 2022-01-13 – 2022-01-14 (×8): 3 [IU] via SUBCUTANEOUS
  Administered 2022-01-15 (×2): 5 [IU] via SUBCUTANEOUS
  Administered 2022-01-15: 2 [IU] via SUBCUTANEOUS
  Administered 2022-01-15: 5 [IU] via SUBCUTANEOUS
  Administered 2022-01-16 (×2): 3 [IU] via SUBCUTANEOUS
  Administered 2022-01-16 (×2): 2 [IU] via SUBCUTANEOUS
  Administered 2022-01-16 – 2022-01-17 (×4): 3 [IU] via SUBCUTANEOUS
  Administered 2022-01-17 (×2): 5 [IU] via SUBCUTANEOUS
  Administered 2022-01-17: 8 [IU] via SUBCUTANEOUS
  Administered 2022-01-17: 3 [IU] via SUBCUTANEOUS
  Administered 2022-01-18: 8 [IU] via SUBCUTANEOUS
  Administered 2022-01-18: 5 [IU] via SUBCUTANEOUS

## 2022-01-10 MED ORDER — PANTOPRAZOLE SODIUM 40 MG IV SOLR
40.0000 mg | Freq: Every day | INTRAVENOUS | Status: DC
  Administered 2022-01-10: 40 mg via INTRAVENOUS
  Filled 2022-01-10: qty 10

## 2022-01-10 MED ORDER — SODIUM CHLORIDE 0.9% FLUSH
10.0000 mL | Freq: Two times a day (BID) | INTRAVENOUS | Status: DC
  Administered 2022-01-11 – 2022-01-17 (×14): 10 mL

## 2022-01-10 MED ORDER — MIDAZOLAM HCL 2 MG/2ML IJ SOLN
INTRAMUSCULAR | Status: AC
  Filled 2022-01-10: qty 2

## 2022-01-10 MED ORDER — DOCUSATE SODIUM 50 MG/5ML PO LIQD
100.0000 mg | Freq: Two times a day (BID) | ORAL | Status: DC
  Administered 2022-01-10 – 2022-01-15 (×11): 100 mg
  Filled 2022-01-10 (×12): qty 10

## 2022-01-10 MED ORDER — FENTANYL CITRATE PF 50 MCG/ML IJ SOSY
PREFILLED_SYRINGE | INTRAMUSCULAR | Status: AC
  Administered 2022-01-10: 25 ug via INTRAVENOUS
  Filled 2022-01-10: qty 2

## 2022-01-10 MED ORDER — FENTANYL CITRATE PF 50 MCG/ML IJ SOSY
25.0000 ug | PREFILLED_SYRINGE | INTRAMUSCULAR | Status: AC | PRN
  Administered 2022-01-10 – 2022-01-11 (×2): 25 ug via INTRAVENOUS
  Filled 2022-01-10: qty 1

## 2022-01-10 MED ORDER — FUROSEMIDE 10 MG/ML IJ SOLN
INTRAMUSCULAR | Status: AC
  Filled 2022-01-10: qty 4

## 2022-01-10 MED ORDER — AMIODARONE HCL IN DEXTROSE 360-4.14 MG/200ML-% IV SOLN
30.0000 mg/h | INTRAVENOUS | Status: DC
  Administered 2022-01-11 (×2): 30 mg/h via INTRAVENOUS
  Administered 2022-01-12 (×3): 60 mg/h via INTRAVENOUS
  Administered 2022-01-13: 30 mg/h via INTRAVENOUS
  Administered 2022-01-13: 60 mg/h via INTRAVENOUS
  Administered 2022-01-14 – 2022-01-17 (×9): 30 mg/h via INTRAVENOUS
  Filled 2022-01-10 (×18): qty 200

## 2022-01-10 MED ORDER — ROCURONIUM BROMIDE 10 MG/ML (PF) SYRINGE
PREFILLED_SYRINGE | INTRAVENOUS | Status: AC
  Filled 2022-01-10: qty 10

## 2022-01-10 MED ORDER — ORAL CARE MOUTH RINSE: 15.0000 mL | OROMUCOSAL | Status: DC | PRN

## 2022-01-10 MED ORDER — VASOPRESSIN 20 UNITS/100 ML INFUSION FOR SHOCK
0.0000 [IU]/min | INTRAVENOUS | Status: DC
  Administered 2022-01-10 – 2022-01-11 (×2): 0.03 [IU]/min via INTRAVENOUS
  Administered 2022-01-11 (×2): 0.04 [IU]/min via INTRAVENOUS
  Administered 2022-01-12 (×3): 0.03 [IU]/min via INTRAVENOUS
  Filled 2022-01-10 (×6): qty 100

## 2022-01-10 MED ORDER — NOREPINEPHRINE 4 MG/250ML-% IV SOLN
0.0000 ug/min | INTRAVENOUS | Status: DC
  Administered 2022-01-10: 2 ug/min via INTRAVENOUS

## 2022-01-10 MED ORDER — AMIODARONE HCL IN DEXTROSE 360-4.14 MG/200ML-% IV SOLN
60.0000 mg/h | INTRAVENOUS | Status: AC
  Administered 2022-01-10 (×2): 60 mg/h via INTRAVENOUS
  Filled 2022-01-10 (×2): qty 200

## 2022-01-10 MED ORDER — NOREPINEPHRINE 4 MG/250ML-% IV SOLN
INTRAVENOUS | Status: AC
  Filled 2022-01-10: qty 250

## 2022-01-10 MED ORDER — PRISMASOL BGK 4/2.5 32-4-2.5 MEQ/L REPLACEMENT SOLN: Status: DC

## 2022-01-10 MED ORDER — CALCIUM GLUCONATE-NACL 1-0.675 GM/50ML-% IV SOLN
1.0000 g | Freq: Once | INTRAVENOUS | Status: AC
  Administered 2022-01-10: 1000 mg via INTRAVENOUS
  Filled 2022-01-10: qty 50

## 2022-01-10 MED ORDER — ALLOPURINOL 100 MG PO TABS: 50.0000 mg | ORAL_TABLET | Freq: Every evening | ORAL | Status: DC

## 2022-01-10 MED ORDER — GABAPENTIN 600 MG PO TABS: 300.0000 mg | ORAL_TABLET | Freq: Every day | ORAL | Status: DC

## 2022-01-10 MED ORDER — PROPOFOL 1000 MG/100ML IV EMUL
0.0000 ug/kg/min | INTRAVENOUS | Status: DC
  Administered 2022-01-10: 10 ug/kg/min via INTRAVENOUS
  Administered 2022-01-10: 30 ug/kg/min via INTRAVENOUS
  Administered 2022-01-11: 15 ug/kg/min via INTRAVENOUS
  Administered 2022-01-12: 20 ug/kg/min via INTRAVENOUS
  Administered 2022-01-12: 15 ug/kg/min via INTRAVENOUS
  Administered 2022-01-12 – 2022-01-13 (×2): 20 ug/kg/min via INTRAVENOUS
  Administered 2022-01-13 – 2022-01-14 (×4): 15 ug/kg/min via INTRAVENOUS
  Filled 2022-01-10 (×5): qty 100
  Filled 2022-01-10: qty 200
  Filled 2022-01-10 (×5): qty 100

## 2022-01-10 MED ORDER — ALTEPLASE 2 MG IJ SOLR: 2.0000 mg | Freq: Once | INTRAMUSCULAR | Status: DC | PRN

## 2022-01-10 MED ORDER — FUROSEMIDE 10 MG/ML IJ SOLN
80.0000 mg | Freq: Two times a day (BID) | INTRAMUSCULAR | Status: DC
Start: 2022-01-10 — End: 2022-01-10

## 2022-01-10 MED ORDER — FENTANYL CITRATE PF 50 MCG/ML IJ SOSY
25.0000 ug | PREFILLED_SYRINGE | INTRAMUSCULAR | Status: DC | PRN
  Administered 2022-01-10 – 2022-01-13 (×7): 50 ug via INTRAVENOUS
  Administered 2022-01-13: 25 ug via INTRAVENOUS
  Administered 2022-01-13 (×4): 50 ug via INTRAVENOUS
  Administered 2022-01-14: 25 ug via INTRAVENOUS
  Filled 2022-01-10 (×2): qty 2
  Filled 2022-01-10 (×2): qty 1
  Filled 2022-01-10: qty 2
  Filled 2022-01-10: qty 1

## 2022-01-10 MED ORDER — PRISMASOL BGK 4/2.5 32-4-2.5 MEQ/L EC SOLN: Status: DC

## 2022-01-10 MED ORDER — CHLORHEXIDINE GLUCONATE CLOTH 2 % EX PADS
6.0000 | MEDICATED_PAD | Freq: Every day | CUTANEOUS | Status: DC
  Administered 2022-01-11 – 2022-01-17 (×11): 6 via TOPICAL

## 2022-01-10 MED ORDER — SODIUM CHLORIDE 0.9% FLUSH: 10.0000 mL | INTRAVENOUS | Status: DC | PRN

## 2022-01-10 MED ORDER — FUROSEMIDE 10 MG/ML IJ SOLN
10.0000 mg/h | INTRAVENOUS | Status: DC
  Administered 2022-01-10 – 2022-01-11 (×2): 10 mg/h via INTRAVENOUS
  Filled 2022-01-10 (×2): qty 20

## 2022-01-10 MED ORDER — HEPARIN SODIUM (PORCINE) 1000 UNIT/ML DIALYSIS
1000.0000 [IU] | INTRAMUSCULAR | Status: DC | PRN
  Administered 2022-01-12: 2400 [IU] via INTRAVENOUS_CENTRAL
  Administered 2022-01-17: 6000 [IU] via INTRAVENOUS_CENTRAL
  Filled 2022-01-10 (×2): qty 6
  Filled 2022-01-10: qty 3
  Filled 2022-01-10: qty 6

## 2022-01-10 MED ORDER — AMIODARONE LOAD VIA INFUSION
150.0000 mg | Freq: Once | INTRAVENOUS | Status: AC
  Administered 2022-01-10: 150 mg via INTRAVENOUS
  Filled 2022-01-10: qty 83.34

## 2022-01-10 MED ORDER — INSULIN GLARGINE-YFGN 100 UNIT/ML ~~LOC~~ SOLN
30.0000 [IU] | Freq: Every day | SUBCUTANEOUS | Status: DC
  Administered 2022-01-11: 30 [IU] via SUBCUTANEOUS
  Filled 2022-01-10 (×2): qty 0.3

## 2022-01-10 MED ORDER — MILRINONE LACTATE IN DEXTROSE 20-5 MG/100ML-% IV SOLN
0.2500 ug/kg/min | INTRAVENOUS | Status: DC
  Administered 2022-01-10 – 2022-01-11 (×2): 0.25 ug/kg/min via INTRAVENOUS
  Filled 2022-01-10 (×3): qty 100

## 2022-01-10 MED ORDER — NOREPINEPHRINE 16 MG/250ML-% IV SOLN
0.0000 ug/min | INTRAVENOUS | Status: DC
  Administered 2022-01-10: 35 ug/min via INTRAVENOUS
  Administered 2022-01-11: 27 ug/min via INTRAVENOUS
  Administered 2022-01-11: 37 ug/min via INTRAVENOUS
  Administered 2022-01-11 (×2): 46 ug/min via INTRAVENOUS
  Administered 2022-01-12 (×2): 38 ug/min via INTRAVENOUS
  Administered 2022-01-12: 45 ug/min via INTRAVENOUS
  Administered 2022-01-12: 36 ug/min via INTRAVENOUS
  Administered 2022-01-13: 18 ug/min via INTRAVENOUS
  Administered 2022-01-13: 29 ug/min via INTRAVENOUS
  Administered 2022-01-14: 18 ug/min via INTRAVENOUS
  Administered 2022-01-14: 26 ug/min via INTRAVENOUS
  Administered 2022-01-15: 18 ug/min via INTRAVENOUS
  Filled 2022-01-10 (×13): qty 250

## 2022-01-10 MED ORDER — HEPARIN SODIUM (PORCINE) 1000 UNIT/ML DIALYSIS: 1000.0000 [IU] | INTRAMUSCULAR | Status: DC | PRN

## 2022-01-10 NOTE — Progress Notes (Signed)
   01/10/22 1525  Clinical Encounter Type  Visited With Family;Patient;Health care provider  Visit Type Initial;Code  Referral From Nurse  Stress Factors  Family Stress Factors Health changes    Chaplain responded to a code blue. Chaplain met patient's husband, Ron, in the waiting area of [facility]. Chaplain checked in on patient, and updated Ron that she was back in the ICU room and the nurses were working on getting her comfortable. Relayed that medical team would be out soon with more information. Chaplain extended hospitality.Chaplain introduced spiritual care services. Spiritual care services available as needed.   Jeri Lager, Chaplain 01/10/22

## 2022-01-10 NOTE — Progress Notes (Signed)
  Echocardiogram 2D Echocardiogram has been performed.  Stephanie Franco 01/10/2022, 4:26 PM

## 2022-01-10 NOTE — Progress Notes (Signed)
PICC order received: Messaged Dr. Debara Pickett for clarification. Pt's eGFR does not meet parameters for PICC RN placement. If she is a candidate for HD in the future, please consider temporary central line or tunneled catheter to avoid using upper extremities.

## 2022-01-10 NOTE — Progress Notes (Signed)
ANTICOAGULATION CONSULT NOTE  Pharmacy Consult for heparin Indication: atrial fibrillation  No Known Allergies  Patient Measurements: Height: '5\' 3"'$  (160 cm) Weight: 81.2 kg (179 lb) IBW/kg (Calculated) : 52.4 Heparin Dosing Weight: 81kg  Vital Signs: Temp: 98.5 F (36.9 C) (08/26 1224) Temp Source: Oral (08/26 1224) BP: 90/74 (08/26 1340) Pulse Rate: 110 (08/26 1340)  Labs: Recent Labs    12/20/2021 2049 01/09/22 0034 01/10/22 0106  HGB 9.2*  --  8.5*  HCT 29.5*  --  28.4*  PLT 202  --  181  CREATININE  --  2.47* 3.60*    Estimated Creatinine Clearance: 14.2 mL/min (A) (by C-G formula based on SCr of 3.6 mg/dL (H)).   Medical History: Past Medical History:  Diagnosis Date   Anxiety    Aortic atherosclerosis (HCC)    Arthritis    Asthma    Atrial fibrillation, permanent (HCC)    Rate control with Bystolic. CHA2DS2Vasc = 6 (HTN, DM, CHF, Age 71,  Female) -> on Pradaxa   Breast cancer ([place]) 2008   S/P mastectomy   CAD S/P percutaneous coronary angioplasty 2006   PCI of circumflex with Taxus DES;; relook-cath Feb 2013: 50-60% short lesion in RCA, 40% ISR Circumflex stent.;  06/2021: Patent LCx stent.  Distal LCx 90% stenosis, proximal-mid RCA 60%.   CHF (congestive heart failure), NYHA class II, chronic, combined ([place])    CKD (chronic kidney disease), stage III (HCC)    Complication of anesthesia    prolonged sedation after colonoscopy in [place]   Diabetes mellitus, uncontrolled 07/14/2011   Dilated cardiomyopathy ([place]) 06/2021   2019: EF down to 25% improved to 50 to 55% x 2020. => Recurrent cardiomyopathy 06/2021 => EF 25 to 30% on Echo, CMR showed severe BiV failure (LVEF 36%, diffuse HK; RVEF 32%).  Severe biatrial enlargement.  Moderate MR posterior directed.  Suspect functional (suggested by TEE); noncoronary pattern for delayed gadolinium-suggest myocarditis, & prior Ant MI-subendocardial => Mixed cardiomyopathy   Fibroid, uterine 07/13/2011   "have that now"    GERD (gastroesophageal reflux disease)    Gout    Hyperlipidemia    Hypertension    Hypokalemia    Morbid obesity ([place])    BMI 41   OSA on CPAP    On CPAP   Pneumonia 2018   Pulmonary hypertension, unspecified (HCC)    PAP ~90 mmHg on Echo 12/2017 -- has OSA on CPAP & obesity -> follow-up sleep study showed mild OSA.;  09/22/2021: RHC showed PAP of 86/30 mmHg with LVEDP of 14 mmHg.   Severe mitral regurgitation      Assessment: 22 yoF s/p unsuccessful TEER for mitral valve. Pt on apixaban PTA for hx AFib, now s/p brief cardiac arrest and pharmacy asked to transition to IV heparin. Last apixaban dose was 8/26 1050.  Goal of Therapy:  Heparin level 0.3-0.7 units/ml aPTT 66-102 seconds Monitor platelets by anticoagulation protocol: Yes   Plan:  Heparin 1150 units/h no bolus starting at 2300 Check heparin level, APTT, CBC in 8h after starting  Arrie Senate, PharmD, BCPS, [facility] Clinical Pharmacist [phone] Please check AMION for all [place] numbers 01/10/2022

## 2022-01-10 NOTE — Progress Notes (Signed)
ABG drawn per MD order x1 attempt. Specimen sent to lab. RT will continue to monitor and be available as needed.

## 2022-01-10 NOTE — Progress Notes (Addendum)
Discussed with Drs. Hilty and McLean: no great options for any mechanical support or surgical things to do here.  VA ECMO not option as not LVAD or transplant candidate.  Will try salvage CRRT to remove fluid and attempt to offload heart to increase forward flow.  If this is not successful she is unfortunately at end of life.  Dr. Jonnie Finner to see.  Code status discussed with husband, he would like everything done but in event of cardiac arrest would like her to be allowed to pass peacefully. DNR entered.  Erskine Emery MD PCCM

## 2022-01-10 NOTE — Progress Notes (Addendum)
DAILY PROGRESS NOTE   Patient Name: MAIRIM BADE Date of Encounter: 01/10/2022 Cardiologist: Glenetta Hew, MD  Chief Complaint   Tired, not urinating  Patient Profile   DASHA KAWABATA is a 71 y.o. female with a history of permanent atrial fibrillation on apixaban, CAD s/p remote PCI of the mid LAD, NICM with an EF of 30 to 35%, CKD stage IIIb, HTN, HLD, DMT2, obesity, metastatic breast cancer to bone and severe mitral regurgitation who presented to [facility] on 8/24 for planned mTEER. Surgery was aborted due to failure to cross the septum despite repeated balloon septostomy. She was admitted overnight for observation and noted to go into afib with RVR and be hypoxic.  Subjective   Ms. Rowlands was admitted Thursday for MitraClip, however, the procedure was aborted. LVEF by TEE on 8/24 shows LVEF 25-30% with mild RV dysfunction and severe MR, moderate to severe TR. She has permanent afib but has been in RVR - started on metoprolol. Has become more progressively hypotensive with decreased urine output (120 ml overnight, down from ~450 yesterday). Nurse noted she is more lethargic. Creatinine is much worse today.    Objective   Vitals:   01/09/22 2308 01/10/22 0338 01/10/22 0748 01/10/22 1045  BP: 94/68 97/64 108/72 (!) 103/50  Pulse: (!) 102 (!) 106 (!) 108 (!) 104  Resp: '20 16 15 12  '$ Temp: 97.7 F (36.5 C) 97.7 F (36.5 C) 98 F (36.7 C) 98.4 F (36.9 C)  TempSrc: Oral Oral Oral Oral  SpO2: 93% 92% 92% 95%  Weight:      Height:        Intake/Output Summary (Last 24 hours) at 01/10/2022 1113 Last data filed at 01/10/2022 0340 Gross per 24 hour  Intake 240 ml  Output 550 ml  Net -310 ml   Filed Weights   12/16/2021 0902  Weight: 81.2 kg    Physical Exam   General appearance: alert, mild distress, and morbidly obese Neck: JVD - several cm above sternal notch, no carotid bruit, and thyroid not enlarged, symmetric, no tenderness/mass/nodules Lungs: diminished breath  sounds bibasilar and rales bilaterally Heart: irregularly irregular rhythm and tachycardic, 3/6 SEM at LLSB Abdomen: morbidly obese, protuberant Extremities: edema 1+ bilateral pitting edema Pulses: 2+ and symmetric Skin: Skin color, texture, turgor normal. No rashes or lesions Neurologic: Mental status: awake, but appears fatigued Psych: Pleasant  Inpatient Medications    Scheduled Meds:  allopurinol  300 mg Oral QPM   apixaban  5 mg Oral BID   dapagliflozin propanediol  10 mg Oral Daily   ezetimibe  10 mg Oral Daily   furosemide  60 mg Intravenous BID   gabapentin  600 mg Oral TID   hydrALAZINE  12.5 mg Oral TID   insulin aspart  0-15 Units Subcutaneous TID WC   insulin aspart  0-5 Units Subcutaneous QHS   insulin glargine  30 Units Subcutaneous Q2000   isosorbide mononitrate  60 mg Oral Daily   letrozole  2.5 mg Oral Daily   metoprolol succinate  25 mg Oral BID   multivitamin with minerals  1 tablet Oral Daily   pantoprazole  40 mg Oral Daily   rosuvastatin  40 mg Oral Daily   senna-docusate  1 tablet Oral BID   sodium chloride flush  3 mL Intravenous Q12H   spironolactone  25 mg Oral Daily    Continuous Infusions:  sodium chloride      PRN Meds: sodium chloride, acetaminophen, albuterol, loratadine, LORazepam,  naphazoline-glycerin, ondansetron (ZOFRAN) IV, oxyCODONE, sodium chloride flush   Labs   Results for orders placed or performed during the hospital encounter of 12/21/2021 (from the past 48 hour(s))  Glucose, capillary     Status: Abnormal   Collection Time: 12/26/2021 11:34 AM  Result Value Ref Range   Glucose-Capillary 114 (H) 70 - 99 mg/dL    Comment: Glucose reference range applies only to samples taken after fasting for at least 8 hours.  Glucose, capillary     Status: None   Collection Time: 01/05/2022  4:33 PM  Result Value Ref Range   Glucose-Capillary 74 70 - 99 mg/dL    Comment: Glucose reference range applies only to samples taken after fasting for  at least 8 hours.  Glucose, capillary     Status: None   Collection Time: 12/21/2021  5:45 PM  Result Value Ref Range   Glucose-Capillary 83 70 - 99 mg/dL    Comment: Glucose reference range applies only to samples taken after fasting for at least 8 hours.  CBC     Status: Abnormal   Collection Time: 01/06/2022  8:49 PM  Result Value Ref Range   WBC 10.0 4.0 - 10.5 K/uL   RBC 3.46 (L) 3.87 - 5.11 MIL/uL   Hemoglobin 9.2 (L) 12.0 - 15.0 g/dL   HCT 29.5 (L) 36.0 - 46.0 %   MCV 85.3 80.0 - 100.0 fL   MCH 26.6 26.0 - 34.0 pg   MCHC 31.2 30.0 - 36.0 g/dL   RDW 19.5 (H) 11.5 - 15.5 %   Platelets 202 150 - 400 K/uL   nRBC 0.0 0.0 - 0.2 %    Comment: Performed at [facility] [address], [place], [place] [address]  Glucose, capillary     Status: Abnormal   Collection Time: 12/28/2021  9:23 PM  Result Value Ref Range   Glucose-Capillary 177 (H) 70 - 99 mg/dL    Comment: Glucose reference range applies only to samples taken after fasting for at least 8 hours.  Lipoprotein A (LPA)     Status: Abnormal   Collection Time: 01/09/22 12:34 AM  Result Value Ref Range   Lipoprotein (a) 484.5 (H) <75.0 nmol/L    Comment: (NOTE) **Results verified by repeat testing** Note:  Values greater than or equal to 75.0 nmol/L may       indicate an independent risk factor for CHD,       but must be evaluated with caution when applied       to non-Caucasian populations due to the       influence of genetic factors on Lp(a) across       ethnicities. Performed At: [facility] [place], [place] [id number] Rush Farmer MD CW:[id number]   Basic metabolic panel     Status: Abnormal   Collection Time: 01/09/22 12:34 AM  Result Value Ref Range   Sodium 137 135 - 145 mmol/L   Potassium 3.8 3.5 - 5.1 mmol/L   Chloride 106 98 - 111 mmol/L   CO2 24 22 - 32 mmol/L   Glucose, Bld 153 (H) 70 - 99 mg/dL    Comment: Glucose reference range applies only to samples taken after fasting  for at least 8 hours.   BUN 44 (H) 8 - 23 mg/dL   Creatinine, Ser 2.47 (H) 0.44 - 1.00 mg/dL   Calcium 8.4 (L) 8.9 - 10.3 mg/dL   GFR, Estimated 20 (L) >60 mL/min    Comment: (  NOTE) Calculated using the CKD-EPI Creatinine Equation (2021)    Anion gap 7 5 - 15    Comment: Performed at [facility], [place] [address], [place], [place] [address]  Glucose, capillary     Status: Abnormal   Collection Time: 01/09/22  6:09 AM  Result Value Ref Range   Glucose-Capillary 155 (H) 70 - 99 mg/dL    Comment: Glucose reference range applies only to samples taken after fasting for at least 8 hours.   Comment 1 Notify RN   Glucose, capillary     Status: Abnormal   Collection Time: 01/09/22 11:56 AM  Result Value Ref Range   Glucose-Capillary 107 (H) 70 - 99 mg/dL    Comment: Glucose reference range applies only to samples taken after fasting for at least 8 hours.  Glucose, capillary     Status: Abnormal   Collection Time: 01/09/22  3:57 PM  Result Value Ref Range   Glucose-Capillary 164 (H) 70 - 99 mg/dL    Comment: Glucose reference range applies only to samples taken after fasting for at least 8 hours.  Glucose, capillary     Status: Abnormal   Collection Time: 01/09/22  9:27 PM  Result Value Ref Range   Glucose-Capillary 213 (H) 70 - 99 mg/dL    Comment: Glucose reference range applies only to samples taken after fasting for at least 8 hours.  Basic metabolic panel     Status: Abnormal   Collection Time: 01/10/22  1:06 AM  Result Value Ref Range   Sodium 135 135 - 145 mmol/L   Potassium 4.9 3.5 - 5.1 mmol/L    Comment: DELTA CHECK NOTED   Chloride 99 98 - 111 mmol/L   CO2 23 22 - 32 mmol/L   Glucose, Bld 131 (H) 70 - 99 mg/dL    Comment: Glucose reference range applies only to samples taken after fasting for at least 8 hours.   BUN 52 (H) 8 - 23 mg/dL   Creatinine, Ser 3.60 (H) 0.44 - 1.00 mg/dL    Comment: DELTA CHECK NOTED   Calcium 8.6 (L) 8.9 - 10.3 mg/dL   GFR, Estimated 13  (L) >60 mL/min    Comment: (NOTE) Calculated using the CKD-EPI Creatinine Equation (2021)    Anion gap 13 5 - 15    Comment: Performed at [facility] [address], [place], [place] [address]  CBC     Status: Abnormal   Collection Time: 01/10/22  1:06 AM  Result Value Ref Range   WBC 8.2 4.0 - 10.5 K/uL   RBC 3.28 (L) 3.87 - 5.11 MIL/uL   Hemoglobin 8.5 (L) 12.0 - 15.0 g/dL   HCT 28.4 (L) 36.0 - 46.0 %   MCV 86.6 80.0 - 100.0 fL   MCH 25.9 (L) 26.0 - 34.0 pg   MCHC 29.9 (L) 30.0 - 36.0 g/dL   RDW 19.3 (H) 11.5 - 15.5 %   Platelets 181 150 - 400 K/uL   nRBC 0.5 (H) 0.0 - 0.2 %    Comment: Performed at [facility], [place] [address] [place], [place] [address]  Glucose, capillary     Status: Abnormal   Collection Time: 01/10/22  6:15 AM  Result Value Ref Range   Glucose-Capillary 136 (H) 70 - 99 mg/dL    Comment: Glucose reference range applies only to samples taken after fasting for at least 8 hours.    ECG   N/A  Telemetry   AFib  with RVR - Personally Reviewed  Radiology    CARDIAC CATHETERIZATION  Addendum Date: 01/09/2022   1.  Aborted mitral edge-to-edge repair due to failure to advance steerable guide catheter across the septum despite repeated balloon septostomy with balloons of incrementally increasing diameter; a future attempt with a priori buddy wire and arterial venous rail for improved support will be considered.  Result Date: 01/09/2022 1.  Aborted mitral edge-to-edge repair due to failure to advance steerable guide catheter across the septum despite repeated balloon septostomy with balloons of incrementally increasing diameter; a future attempt with high priority arterial venous rail for improved support may be considered.   ECHO TEE  Result Date: 01/02/2022    TRANSESOPHOGEAL ECHO REPORT   Patient Name:   LYNDALL BELLOT Date of Exam: 12/31/2021 Medical Rec #:  [id number]        Height:       63.0 in Accession #:    [id number]       Weight:        179.0 lb Date of Birth:  1950-09-11        BSA:          1.845 m Patient Age:    8 years         BP:           112/73 mmHg Patient Gender: F                HR:           100 bpm. Exam Location:  Inpatient Procedure: Transesophageal Echo, 3D Echo, Color Doppler and Cardiac Doppler Indications:     I34.0 Nonrheumatic mitral (valve) insufficiency  History:         Patient has prior history of Echocardiogram examinations, most                  recent 10/22/2021. CHF, CAD, Pulmonary HTN, Arrythmias:Atrial                  Fibrillation; Risk Factors:Hypertension, Diabetes, Dyslipidemia                  and Sleep Apnea.  Sonographer:     Raquel Sarna Senior RDCS Referring Phys:  [id number] Early [place] Diagnosing Phys: Sanda Klein MD PROCEDURE: After discussion of the risks and benefits of a TEE, an informed consent was obtained from the patient. The transesophogeal probe was passed without difficulty through the esophogus of the patient. Sedation performed by different physician. The patient was monitored while under deep sedation. The patient developed no complications during the procedure. IMPRESSIONS  1. Left ventricular ejection fraction, by estimation, is 25 to 30%. The left ventricle has severely decreased function. The left ventricle demonstrates global hypokinesis. Left ventricular diastolic function could not be evaluated. There is the interventricular septum is flattened in systole and diastole, consistent with right ventricular pressure and volume overload.  2. Right ventricular systolic function is mildly reduced. The right ventricular size is mildly enlarged. There is moderately elevated pulmonary artery systolic pressure. The estimated right ventricular systolic pressure is 23.7 mmHg.  3. There is no ASD at baseline. After atrial septal puncture and balloon dilation atrial septostomy there is an iatrogenic ASD, 4x7 mm. Left atrial size was severely dilated. No left atrial/left atrial appendage thrombus was  detected.  4. Right atrial size was severely dilated.  5. There is severe tethering of the posterior mitral leaflet due to annulus and chamber remodeling. The mitral valve is abnormal. Severe  mitral valve regurgitation. No evidence of mitral stenosis.  6. Tricuspid valve regurgitation is moderate to severe.  7. The aortic valve is tricuspid. Aortic valve regurgitation is not visualized. No aortic stenosis is present.  8. There is a small secundum atrial septal defect with predominantly left to right shunting across the atrial septum. FINDINGS  Left Ventricle: Left ventricular ejection fraction, by estimation, is 25 to 30%. The left ventricle has severely decreased function. The left ventricle demonstrates global hypokinesis. The left ventricular internal cavity size was normal in size. There is no left ventricular hypertrophy. The interventricular septum is flattened in systole and diastole, consistent with right ventricular pressure and volume overload. Left ventricular diastolic function could not be evaluated due to atrial fibrillation. Left ventricular diastolic function could not be evaluated. Right Ventricle: The right ventricular size is mildly enlarged. No increase in right ventricular wall thickness. Right ventricular systolic function is mildly reduced. There is moderately elevated pulmonary artery systolic pressure. The tricuspid regurgitant velocity is 3.10 m/s, and with an assumed right atrial pressure of 10 mmHg, the estimated right ventricular systolic pressure is 40.3 mmHg. Left Atrium: There is no ASD at baseline. After atrial septal puncture and balloon dilation atrial septostomy there is an iatrogenic ASD, 4x7 mm. Left atrial size was severely dilated. No left atrial/left atrial appendage thrombus was detected. Right Atrium: Right atrial size was severely dilated. Pericardium: There is no evidence of pericardial effusion. Mitral Valve: There is severe tethering of the posterior mitral leaflet due  to annulus and chamber remodeling. The mitral valve is abnormal. Severe mitral valve regurgitation, with eccentric posteriorly directed jet. No evidence of mitral valve stenosis.  MV peak gradient, 3.9 mmHg. The mean mitral valve gradient is 2.0 mmHg. Tricuspid Valve: The tricuspid valve is normal in structure. Tricuspid valve regurgitation is moderate to severe. Aortic Valve: The aortic valve is tricuspid. Aortic valve regurgitation is not visualized. No aortic stenosis is present. Pulmonic Valve: The pulmonic valve was normal in structure. Pulmonic valve regurgitation is not visualized. Aorta: The aortic root, ascending aorta, aortic arch and descending aorta are all structurally normal, with no evidence of dilitation or obstruction. IAS/Shunts: There is a small secundum atrial septal defect with predominantly left to right shunting across the atrial septum.  MITRAL VALVE                  TRICUSPID VALVE MV Peak grad: 3.9 mmHg        TR Peak grad:   38.4 mmHg MV Mean grad: 2.0 mmHg        TR Vmax:        310.00 cm/s MV Vmax:      0.99 m/s MV Vmean:     62.5 cm/s MR Peak grad:    70.2 mmHg MR Mean grad:    48.0 mmHg MR Vmax:         419.00 cm/s MR Vmean:        332.0 cm/s MR PISA:         3.08 cm MR PISA Eff ROA: 20 mm MR PISA Radius:  0.70 cm Mihai Croitoru MD Electronically signed by Sanda Klein MD Signature Date/Time: 01/04/2022/3:18:48 PM    Final     Cardiac Studies   See above  Assessment   Principal Problem:   Severe mitral regurgitation Active Problems:   Essential hypertension   Morbid obesity (HCC)   Hyperlipidemia associated with type 2 diabetes mellitus ([place])   Obstructive sleep apnea syndrome   Diabetes  mellitus ([place])   CAD S/P percutaneous coronary angioplasty   Permanent atrial fibrillation: CHA2DS2-VASc Score 6   Chronic kidney disease (CKD), stage III (moderate) (HCC)   Chronic combined systolic and diastolic CHF, NYHA class 2 and ACC/AHA stage C ([place])   Dilated  cardiomyopathy ([place])   Pulmonary hypertension (HCC)   GERD (gastroesophageal reflux disease)   Breast cancer metastasized to bone [facility])   Mitral regurgitation   Plan   Acute systolic congestive heart failure/Low output heart failure/cardiorenal syndrome: Ms. Lynn is noted to become progressively fatigued with worsening renal function and oliguria concerning for low output heart failure given severe cardiomyopathy with LVEF 25 to 30% and severe MR and TR.  She is hypotensive and remains in A-fib with RVR.  Metoprolol was just started.  Discussed with Dr. Aundra Dubin of the heart failure service.  We recommend the following: We will place PICC and obtain co-oximetry panel Start milrinone 0.25 mcg/kg/mL Stop metoprolol and start amiodarone for rate control Increased Lasix to 80 mg IV twice daily Stop Imdur and hydralazine for hypotension  Severe MR/TR: she underwent aborted mitral transcatheter edge-to-edge repair due to failure to cross the septum despite repeated balloon septostomy.  She was admitted overnight for observation. She did have prolonged radiation exposure given length of case. The patient is interested trying mTEER again in the future if possible.    Permanent afib: she has permanent afib but developed persistently elevated rates. Previously on digoxin and amio but these were discontinued in 07/2021 due to bradycardia and junctional rhythm.  Stop metoprolol and start amiodarone Continue Eliquis   HTN: Now hypotensive Stop Imdur and hydralazine in the setting of low output heart failure Stop metoprolol  CKD stage IV: creat 2.47 -now rising to 3.6 today consistent with low output heart failure/cardiorenal syndrome.  Plan as above to institute milrinone and IV diuresis.  I have also renally adjusted her medications including her allopurinol and significantly decreased her Neurontin to account for her acute kidney injury.  DM2: continue SSI   7.   Post-operative ABLA: Hemoglobin  today is 8.5/hematocrit 28.4, down from 9.2/29.5.  This has been drifting down and she was noted to have some postoperative bleeding.  We will type and screen but not transfuse.  Repeat CBC tomorrow and consider if hemoglobin drops below 8 ml/dL.  CRITICAL CARE TIME: I have spent a total of 85 minutes with patient reviewing hospital notes, telemetry, EKGs, labs and examining the patient as well as establishing an assessment and plan that was discussed with the patient.  > 50% of time was spent in direct patient care. The patient is critically ill with multi-organ system failure and requires high complexity decision making for assessment and support, frequent evaluation and titration of therapies, application of advanced monitoring technologies and extensive interpretation of multiple databases.  Discussed with advanced heart failure service-they will follow-up tomorrow.   Length of Stay:  LOS: 2 days   Pixie Casino, MD, [facility], [place] Director of the Advanced Lipid Disorders &  Cardiovascular Risk Reduction Clinic Diplomate of the American Board of Clinical Lipidology Attending Cardiologist  Direct Dial: [phone]  Fax: [phone]  Website:  [email or web address] Kameela Leipold 01/10/2022, 11:14 AM

## 2022-01-10 NOTE — Progress Notes (Signed)
RT Note-Patient placed on 20ppm Nitric per order.

## 2022-01-10 NOTE — Progress Notes (Signed)
RT, RN x2 and NT transported pt on the ventilator from 2C06 to 5I71 without complication. RT will continue monitor and be available as needed.

## 2022-01-10 NOTE — Procedures (Signed)
01/10/2022  Called to room for worsening sats. Was somonlent during CVL, to try BIPAP now nearly obtunded with sats in 70s. Attempted without sedation and MAC4, no good visualization; anesthesia had glidescope and ETT placed without issue.  Lost pulses for about 1 minute, 1 dose epi, 1 dose bicarb.  Now awake following commands.  Etiology of her hypoxemia is a little unclear, more somnolent through day and maybe developed apnea?  See full consult note, we will take over as primary.   Intubation Procedure Note  Stephanie Franco  [id number]  December 17, 1950  Date:01/10/22  Time:3:14 PM   Provider Performing:Molly Savarino C Tamala Julian    Procedure: Intubation (01314)  Indication(s) Respiratory Failure  Consent Unable to obtain consent due to emergent nature of procedure.   Anesthesia None   Time Out Verified patient identification, verified procedure, site/side was marked, verified correct patient position, special equipment/implants available, medications/allergies/relevant history reviewed, required imaging and test results available.   Sterile Technique Usual hand hygeine, masks, and gloves were used   Procedure Description Patient positioned in bed supine.  Sedation given as noted above.  Patient was intubated with endotracheal tube using Glidescope.  View was Grade 1 full glottis .  Number of attempts was 1.  Colorimetric CO2 detector was consistent with tracheal placement.   Complications/Tolerance Brief CPR as above Chest X-ray is ordered to verify placement.   EBL Minimal   Specimen(s) None

## 2022-01-10 NOTE — Significant Event (Signed)
   Called to [facility] for significant progressive respiratory difficulty and unresponsiveness.  On arrival found the patient confused with agonal respirations.  RT was obtaining a blood gas.  Central lumen catheter had been placed by PCCM and I personally reviewed the x-ray with Dr. Tamala Julian.  The CVC lumen appears to extend well into the right chest however she has a markedly enlarged RV.  He noted dark venous blood return and felt that catheter was adequately placed.  The patient then quickly progressed into hypoxemic respiratory failure and developed PEA arrest.  Monitor showed a junctional bradycardia.  She was given an amp of bicarbonate and epinephrine and brief CPR was performed until an adequate airway could be placed.  Subsequently she had adequate oxygenation, return of femoral pulses and improvement in perfusion and blood pressure.  I ordered an additional 80 mg of IV Lasix.  We will continue amiodarone and milrinone.  Transfer to [facility] for close monitoring.  Advanced heart failure service is aware of the patient and Dr. Aundra Dubin kindly agreed to see the patient tomorrow.  Appreciate PCCM assistance and comanagement of her airway issues.  Co-ox prior to her arrest actually was reasonably elevated at 63.2.  ABG is pending.  Pixie Casino, MD, [facility], [place] Director of the Advanced Lipid Disorders &  Cardiovascular Risk Reduction Clinic Diplomate of the American Board of Clinical Lipidology Attending Cardiologist  Direct Dial: [phone]  Fax: [phone]  Website:  [email or web address]

## 2022-01-10 NOTE — Procedures (Signed)
Arterial Catheter Insertion Procedure Note  Stephanie Franco  [id number]  24-Feb-1951  Date:01/10/22  Time:4:34 PM    Provider Performing: Candee Furbish    Procedure: Insertion of Arterial Line [phone]) with US guidance (10272)   Indication(s) Blood pressure monitoring and/or need for frequent ABGs  Consent Unable to obtain consent due to emergent nature of procedure.  Anesthesia None   Time Out Verified patient identification, verified procedure, site/side was marked, verified correct patient position, special equipment/implants available, medications/allergies/relevant history reviewed, required imaging and test results available.   Sterile Technique Maximal sterile technique including full sterile barrier drape, hand hygiene, sterile gown, sterile gloves, mask, hair covering, sterile ultrasound probe cover (if used).   Procedure Description Area of catheter insertion was cleaned with chlorhexidine and draped in sterile fashion. With real-time ultrasound guidance an arterial catheter was placed into the right radial artery.  Appropriate arterial tracings confirmed on monitor.     Complications/Tolerance None; patient tolerated the procedure well.   EBL Minimal   Specimen(s) None

## 2022-01-10 NOTE — Procedures (Signed)
Central Venous Catheter Insertion Procedure Note  LOTOYA CASELLA  [id number]  Sep 29, 1950  Date:01/10/22  Time:3:11 PM   Provider Performing:Rafi Kenneth Loletha Grayer Tamala Julian   Procedure: Insertion of Non-tunneled Central Venous [phone]) with US guidance ([id number])   Indication(s) Medication administration  Consent Risks of the procedure as well as the alternatives and risks of each were explained to the patient and/or caregiver.  Consent for the procedure was obtained and is signed in the bedside chart  Anesthesia Topical only with 1% lidocaine   Timeout Verified patient identification, verified procedure, site/side was marked, verified correct patient position, special equipment/implants available, medications/allergies/relevant history reviewed, required imaging and test results available.  Sterile Technique Maximal sterile technique including full sterile barrier drape, hand hygiene, sterile gown, sterile gloves, mask, hair covering, sterile ultrasound probe cover (if used).  Procedure Description Area of catheter insertion was cleaned with chlorhexidine and draped in sterile fashion.  With real-time ultrasound guidance a central venous catheter was placed into the left internal jugular vein. Nonpulsatile blood flow and easy flushing noted in all ports.  The catheter was sutured in place and sterile dressing applied.  Complications/Tolerance None; patient tolerated the procedure well. Chest X-ray is ordered to verify placement for internal jugular or subclavian cannulation.   Chest x-ray is not ordered for femoral cannulation.  EBL Minimal  Specimen(s) None

## 2022-01-10 NOTE — Procedures (Signed)
Central Venous Catheter Insertion Procedure Note  Stephanie Franco  [id number]  03-25-1951  Date:01/10/22  Time:5:24 PM   Provider Performing:Joette Schmoker D. Harris   Procedure: Insertion of Non-tunneled Central Venous Catheter(36556)with US guidance (44628)      Indication(s) Hemodialysis  Consent Risks of the procedure as well as the alternatives and risks of each were explained to the patient and/or caregiver.  Consent for the procedure was obtained and is signed in the bedside chart  Anesthesia Topical only with 1% lidocaine   Timeout Verified patient identification, verified procedure, site/side was marked, verified correct patient position, special equipment/implants available, medications/allergies/relevant history reviewed, required imaging and test results available.  Sterile Technique Maximal sterile technique including full sterile barrier drape, hand hygiene, sterile gown, sterile gloves, mask, hair covering, sterile ultrasound probe cover (if used).  Procedure Description Area of catheter insertion was cleaned with chlorhexidine and draped in sterile fashion.   With real-time ultrasound guidance a HD catheter was placed into the right internal jugular vein.  Nonpulsatile blood flow and easy flushing noted in all ports.  The catheter was sutured in place and sterile dressing applied.  Complications/Tolerance None; patient tolerated the procedure well. Chest X-ray is ordered to verify placement for internal jugular or subclavian cannulation.  Chest x-ray is not ordered for femoral cannulation.  EBL Minimal  Specimen(s) None  Stephanie Franco D. Stephanie Kingfisher, NP-C [facility] Personal contact information can be found on Amion  01/10/2022, 5:25 PM

## 2022-01-10 NOTE — Consult Note (Addendum)
NAME:  Stephanie Franco, MRN:  [id number], DOB:  December 10, 1950, LOS: 2 ADMISSION DATE:  01/01/2022, CONSULTATION DATE:  01/10/22 REFERRING MD:  Debara Pickett, CHIEF COMPLAINT:  SOB   History of Present Illness:  71 year old woman with hx of afib on eliquis, NICM, CKD, DM2, HLD, HTN p/w symptomatic sevre mitral regurgitation manifested by DOE.  She had attempted mitral clipping via atrial septostomy on 01/04/2022 that was aborted due to inability to cross septum.  Subsequently has developed worsening oliguria and today more somnolence.  Initially PCCM consulted for central access but patient has deteriorated further with brief cardiac arrest due to hypoxemia requiring intubation and transfer to ICU.  Awake following commands on vent, trying to get her sedated.  Pertinent  Medical History   Past Medical History:  Diagnosis Date   Anxiety    Aortic atherosclerosis (HCC)    Arthritis    Asthma    Atrial fibrillation, permanent (HCC)    Rate control with Bystolic. CHA2DS2Vasc = 6 (HTN, DM, CHF, Age 18, Female) -> on Pradaxa   Breast cancer ([place]) 2008   S/P mastectomy   CAD S/P percutaneous coronary angioplasty 2006   PCI of circumflex with Taxus DES;; relook-cath Feb 2013: 50-60% short lesion in RCA, 40% ISR Circumflex stent.;  06/2021: Patent LCx stent.  Distal LCx 90% stenosis, proximal-mid RCA 60%.   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 => Recurrent cardiomyopathy 06/2021 => EF 25 to 30% on Echo, CMR showed severe BiV failure (LVEF 36%, diffuse HK; RVEF 32%).  Severe biatrial enlargement.  Moderate MR posterior directed.  Suspect functional (suggested by TEE); noncoronary pattern for delayed gadolinium-suggest  myocarditis, & prior Ant MI-subendocardial => Mixed cardiomyopathy   Fibroid, uterine 07/13/2011   "have that now"   GERD (gastroesophageal reflux disease)    Gout    Hyperlipidemia    Hypertension    Hypokalemia    Morbid obesity ([place])    BMI 41   OSA on CPAP    On CPAP   Pneumonia 2018   Pulmonary hypertension, unspecified (HCC)    PAP ~90 mmHg on Echo 12/2017 -- has OSA on CPAP & obesity -> follow-up sleep study showed mild OSA.;  09/22/2021: RHC showed PAP of 86/30 mmHg with LVEDP of 14 mmHg.   Severe mitral regurgitation      Significant Hospital Events: Including procedures, antibiotic start and stop dates in addition to other pertinent events   8/24 admit, failed mitraclip 8/25 worsening Bps, worsening renal function 8/26 PCCM consult, brief code  Interim History / Subjective:  Consulted  Objective   Blood pressure 90/74, pulse (!) 110, temperature 98.5 F (36.9 C), temperature source Oral, resp. rate 14, height 5' 3"  (1.6 m), weight 81.2 kg, SpO2 100 %.    Vent Mode: PRVC FiO2 (%):  [100 %] 100 % Set Rate:  [24 bmp] 24 bmp Vt Set:  [420 mL] 420 mL PEEP:  [8 cmH20] 8 cmH20 Plateau Pressure:  [22 cmH20] 22 cmH20   Intake/Output Summary (Last 24 hours) at 01/10/2022 1528 Last data filed at 01/10/2022 0340 Gross per 24 hour  Intake 240 ml  Output 150 ml  Net 90 ml  Filed Weights   12/19/2021 0902  Weight: 81.2 kg    Examination: General: Reaching for tube HENT: ETT minimal secretions  Lungs: diminished bases Cardiovascular: thready pulses, irregular Abdomen: Protuberant, hypoactive BS Extremities: marked edema Neuro: moving all 4 ext Skin: no rashes  Worsening renal function Post arrest labs pending CXR marked cardiomegaly, passive vascular congestion, CVL in appropriate position  Resolved Hospital Problem list   N/A  Assessment & Plan:  Severe mixed valvular/nonvalvular failure Acute hypoxemic respiratory and cardiac arrest- initial worsening  somnolence preceding but preserved gas exchange on ABG?  Then severe profound hypoxemi.a Timing is odd but seems c/w a shunting event.  Doubtful VTE given anticoagulated state. Acute on chronic renal injury- question cardiorenal, worse after procedure. 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  - Vent support VAP prevention bundle - Stat echo - Check lactate - Lasix gtt, milrinone - If echo with bidirectional shunt and/or worsening pHTN, start iNO - Will update husband - Levophed to MAP 65 - Need some direction on risk/benefits of ASD closure should this need to be considered; will speak with cardiology  Best Practice (right click and "Reselect all SmartList Selections" daily)   Diet/type: NPO DVT prophylaxis: systemic heparin GI prophylaxis: PPI Lines: Central line Foley:  Yes, and it is still needed Code Status:  full code Last date of multidisciplinary goals of care discussion [pending]  Labs   CBC: Recent Labs  Lab 01/05/22 1040 12/24/2021 2049 01/10/22 0106  WBC 8.3 10.0 8.2  HGB 10.3* 9.2* 8.5*  HCT 34.3* 29.5* 28.4*  MCV 87.1 85.3 86.6  PLT 233 202 017    Basic Metabolic Panel: Recent Labs  Lab 01/05/22 1040 01/09/22 0034 01/10/22 0106  NA 135 137 135  K 3.6 3.8 4.9  CL 93* 106 99  CO2 29 24 23   GLUCOSE 108* 153* 131*  BUN 45* 44* 52*  CREATININE 2.44* 2.47* 3.60*  CALCIUM 9.6 8.4* 8.6*   GFR: Estimated Creatinine Clearance: 14.2 mL/min (A) (by C-G formula based on SCr of 3.6 mg/dL (H)). Recent Labs  Lab 01/05/22 1040 12/28/2021 2049 01/10/22 0106  WBC 8.3 10.0 8.2    Liver Function Tests: Recent Labs  Lab 01/05/22 1040  AST 30  ALT 25  ALKPHOS 75  BILITOT 0.8  PROT 7.6  ALBUMIN 3.4*   No results for input(s): "LIPASE", "AMYLASE" in the last 168 hours. No results for input(s): "AMMONIA" in the last 168 hours.  ABG    Component Value Date/Time   PHART 7.35 01/10/2022 1435   PCO2ART 46  01/10/2022 1435   PO2ART 52 (L) 01/10/2022 1435   HCO3 25.4 01/10/2022 1435   TCO2 32 10/22/2021 1223   TCO2 33 (H) 10/22/2021 1223   ACIDBASEDEF 0.6 01/10/2022 1435   O2SAT 78.9 01/10/2022 1435     Coagulation Profile: Recent Labs  Lab 01/05/22 1040  INR 1.5*    Cardiac Enzymes: No results for input(s): "CKTOTAL", "CKMB", "CKMBINDEX", "TROPONINI" in the last 168 hours.  HbA1C: Hgb A1c MFr Bld  Date/Time Value Ref Range Status  01/05/2022 10:40 AM 6.6 (H) 4.8 - 5.6 % Final    Comment:    (NOTE) Pre diabetes:          5.7%-6.4%  Diabetes:              >6.4%  Glycemic control for   <7.0% adults with diabetes   06/24/2021 11:56 AM 7.5 (H) 4.8 - 5.6 % Final  Comment:    (NOTE) Pre diabetes:          5.7%-6.4%  Diabetes:              >6.4%  Glycemic control for   <7.0% adults with diabetes     CBG: Recent Labs  Lab 01/09/22 1156 01/09/22 1557 01/09/22 2127 01/10/22 0615 01/10/22 1228  GLUCAP 107* 164* 213* 136* 155*    Review of Systems:   Intubated  Past Medical History:  She,  has a past medical history of Anxiety, Aortic atherosclerosis ([place]), Arthritis, Asthma, Atrial fibrillation, permanent ([place]), Breast cancer ([place]) (2008), CAD S/P percutaneous coronary angioplasty (2006), CHF (congestive heart failure), NYHA class II, chronic, combined ([place]), CKD (chronic kidney disease), stage III ([place]), Complication of anesthesia, Diabetes mellitus, uncontrolled (07/14/2011), Dilated cardiomyopathy ([place]) (06/2021), Fibroid, uterine (07/13/2011), GERD (gastroesophageal reflux disease), Gout, Hyperlipidemia, Hypertension, Hypokalemia, Morbid obesity ([place]), OSA on CPAP, Pneumonia (2018), Pulmonary hypertension, unspecified ([place]), and Severe mitral regurgitation.   Surgical History:   Past Surgical History:  Procedure Laterality Date   Cardiac MRI  06/24/2021   Severe biventricular failure: LVEF 36%-diffuse HK. RV EF 32%. Severe BiA Enlargement. Mod MR. LGE in  septum - non-coronary pattern -? Myocarditis. Anterior wall LGE is subendocardial - ? Prior MI.   COLONOSCOPY     CORONARY ANGIOPLASTY WITH STENT PLACEMENT  2006   stent to circumflex   DILATION AND CURETTAGE OF UTERUS     LEFT HEART CATHETERIZATION WITH CORONARY ANGIOGRAM N/A 07/13/2011   Procedure: LEFT HEART CATHETERIZATION WITH CORONARY ANGIOGRAM;  Surgeon: Leonie Man, MD;  Location: [facility] CATH LAB;  Service: Cardiovascular;  normal EF; 90m 50-60% lesion in RCA; patent stent in circumflex form 2006 with ~40% in-stent re-stenosis   MASTECTOMY  2001   left   MITRAL VALVE REPAIR N/A 01/02/2022   Procedure: MITRAL VALVE REPAIR;  Surgeon: TEarly Osmond MD;  [facility];  Service: Cardiovascular;  Laterality: N/A;   NM MYOVIEW LTD  7/'16; 6/'21   LOW RISK. No ischemia or infarction. Not gated 2/2  A. fib - unable to assess EF   RIGHT HEART CATH N/A 10/22/2021   Procedure: RIGHT HEART CATH;  Surgeon: MLarey Dresser MD;  [facility];  Service: Cardiovascular;  Laterality: N/A;   RIGHT/LEFT HEART CATH AND CORONARY ANGIOGRAPHY N/A 06/23/2021   Procedure: RIGHT/LEFT HEART CATH AND CORONARY ANGIOGRAPHY;  Surgeon: BLorretta Harp MD;  [facility];  Service: Cardiovascular; p-mRCA 60%.  Distal LCx 90%. mLAD stent patent.  RAP 22 RVP 70/1 mmHg with PAP 80/30 mmHg. LVEDP 14 mmHg-unable to assess PCWP.   TEE WITHOUT CARDIOVERSION N/A 06/25/2021   Procedure: TRANSESOPHAGEAL ECHOCARDIOGRAM (TEE);  Surgeon: MLarey Dresser MD;  [facility]; EF 35 to 40% with global HK.  Mildly reduced RV function.  Severely dilated left atrium with no LAA thrombus.  ?  Mild RA dilation.  Posterior MVL restriction w/ moderate eccentric/posterior directed MR.  No PV reversal. ~ functional MR 2/2 Afib.  Mild dilation of the AscAo (~ 40 mm)   TEE WITHOUT CARDIOVERSION N/A 10/22/2021   Procedure: TRANSESOPHAGEAL ECHOCARDIOGRAM (TEE);  Surgeon: MLarey Dresser MD;  [facility];  Service: Cardiovascular;  Laterality: N/A;   TEE WITHOUT CARDIOVERSION N/A 12/26/2021   Procedure: TRANSESOPHAGEAL ECHOCARDIOGRAM (TEE);  Surgeon: TEarly Osmond MD;  [facility];  Service: Cardiovascular;  Laterality: N/A;   TOTAL HIP ARTHROPLASTY Right 01/19/2018  Procedure: RIGHT TOTAL HIP ARTHROPLASTY ANTERIOR APPROACH;  Surgeon: Rod Can, MD;  Location: [facility] ORS;  Service: Orthopedics;  Laterality: Right;  Needs RNFA   TRANSTHORACIC ECHOCARDIOGRAM  6/'15; 9/'16   a) In setting of Urosepsis: EF ~25 %, global HK (poor quality study);; b) LV EF 40-45%, Mild Anteroseptal HK. Mod-Severe  RA dilation with elevated PA Pressures (~~peak 63 mmHg).. Mild LA dilation   TRANSTHORACIC ECHOCARDIOGRAM  12/2017; 04/15/2018   a) Improved LVEF to 45 to 50%.  Pulmonary HTN ~PAP ~ 60 mmHg. Severe LA dilation& mild /rv dilation.; b) Mod LVH. EF 40-45% (back to prior EF). Unable to assess DF (Afib). Severe R&L Atrial enlargement.  Moderate Post-Lateral directed MR with Mod RV dilation.  PAP ~66 mmHg.;    TRANSTHORACIC ECHOCARDIOGRAM  01/2019   relook Echo: EF back up to 50 to 55%.  Indeterminate diastolic parameters.  Moderately dilated left atrium and right atrium.  Moderate MR.  Mild to moderate TR.   TRANSTHORACIC ECHOCARDIOGRAM  06/21/2021   EF 25 to 30%.  Severely reduced function.  Paradoxical septal motion consistent with LBBB.  Unable to assess diastolic pressures.  Severe biatrial dilation.  Mildly elevated PA P estimated 43.5 mmHg-with RAP estimated at 8 mmHg..  Moderate posteriorly directed MR.     Social History:   reports that she quit smoking about 29 years ago. Her smoking use included cigarettes. She has a 3.00 pack-year smoking history. She has never used smokeless tobacco. She reports that she does not drink alcohol and does not use drugs.   Family History:  Her family history includes Atrial fibrillation in her son; Coronary artery  disease in her mother; Healthy in her daughter, daughter, and son; Heart disease in her brother, brother, and father; Hypertension in her mother; Lung cancer in her sister; Thyroid disease in her daughter.   Allergies No Known Allergies   Home Medications  Prior to Admission medications   Medication Sig Start Date End Date Taking? Authorizing Provider  acetaminophen (TYLENOL) 500 MG tablet Take 1,000 mg by mouth every 6 (six) hours as needed for mild pain.   Yes [provider]  albuterol (PROVENTIL HFA;VENTOLIN HFA) 108 (90 Base) MCG/ACT inhaler Inhale 1-2 puffs into the lungs every 6 (six) hours as needed for wheezing or shortness of breath. 05/20/15  Yes Harvel Quale, MD  allopurinol (ZYLOPRIM) 300 MG tablet TAKE 1 TABLET BY MOUTH EVERY DAY AS NEEDED Patient taking differently: Take 300 mg by mouth every evening. 11/17/21  Yes Irene Pap, PA-C  Biotin 1000 MCG tablet Take 1,000 mcg by mouth daily.   Yes [provider]  ELIQUIS 5 MG TABS tablet TAKE 1 TABLET BY MOUTH TWICE A DAY 07/11/19  Yes Leonie Man, MD  ezetimibe (ZETIA) 10 MG tablet Take 1 tablet (10 mg total) by mouth daily. 06/27/21  Yes Simmons, Brittainy M, PA-C  FARXIGA 10 MG TABS tablet TAKE 1 TABLET BY MOUTH DAILY 12/17/21  Yes Lyda Jester M, PA-C  gabapentin (NEURONTIN) 600 MG tablet TAKE 1 TABLET BY MOUTH THREE TIMES A DAY 12/10/21  Yes Tysinger, Camelia Eng, PA-C  hydrALAZINE (APRESOLINE) 25 MG tablet Take 0.5 tablets (12.5 mg total) by mouth 3 (three) times daily. 10/07/21  Yes Larey Dresser, MD  Hydrocortisone, Perianal, 1 % CREA Apply 1 application. topically 3 (three) times daily as needed (irritation). 09/18/21  Yes [provider]  isosorbide mononitrate (IMDUR) 60 MG 24 hr tablet Take 60 mg by mouth daily.  Yes [provider]  letrozole (FEMARA) 2.5 MG tablet Take 1 tablet (2.5 mg total) by mouth daily. 10/02/21  Yes Owens Shark, NP  LORazepam (ATIVAN) 1 MG tablet Take  1 mg by mouth 2 (two) times daily as needed for anxiety. 07/07/16  Yes [provider]  metolazone (ZAROXOLYN) 2.5 MG tablet Take 1 tablet (2.5 mg total) by mouth once a week. On Mondays. With an extra 2 tablets of potassium 12/29/21  Yes Milford, Maricela Bo, FNP  Multiple Vitamins-Minerals (MULTIVITAMIN WITH MINERALS) tablet Take 1 tablet by mouth daily.   Yes [provider]  Naphazoline-Glycerin (CLEAR EYES MAX REDNESS RELIEF OP) Place 2 drops into both eyes daily as needed (redness).   Yes [provider]  nystatin (MYCOSTATIN/NYSTOP) powder Apply 1 Application topically 2 (two) times daily as needed (rash).   Yes [provider]  Oxycodone HCl 10 MG TABS Take 0.5 tablets (5 mg total) by mouth 3 (three) times daily as needed. For pain 12/24/21  Yes Owens Shark, NP  pantoprazole (PROTONIX) 40 MG tablet TAKE 1 TABLET BY MOUTH EVERY DAY 04/02/21  Yes Leonie Man, MD  potassium chloride SA (KLOR-CON M) 20 MEQ tablet Take 3 tablets by mouth in the morning and evening Patient taking differently: Take 60-80 mEq by mouth See admin instructions. Take 60 meq twice daily except on Mondays take 80 meq twice daily (with the metolazone) 12/16/21  Yes Milford, Maricela Bo, FNP  rosuvastatin (CRESTOR) 40 MG tablet Take 40 mg by mouth daily.   Yes [provider]  senna-docusate (SENOKOT-S) 8.6-50 MG tablet Take 1 tablet by mouth 2 (two) times daily. 09/26/21  Yes Lajean Manes, MD  spironolactone (ALDACTONE) 25 MG tablet Take 1 tablet (25 mg total) by mouth daily. 09/26/21  Yes Lajean Manes, MD  torsemide (DEMADEX) 20 MG tablet Take 60 mg by mouth 2 (two) times daily.   Yes [provider]  TOUJEO SOLOSTAR 300 UNIT/ML Solostar Pen Inject 30 Units into the skin 2 (two) times daily. 07/28/21  Yes [provider]  triamcinolone ointment (KENALOG) 0.5 % Apply 1 Application topically 2 (two) times daily as needed (rash/itching).   Yes [provider]   fexofenadine (ALLEGRA) 180 MG tablet Take 180 mg by mouth daily as needed for allergies.  04/30/15   [provider]  glucose blood test strip 1 each by Other route as needed for other. Use as instructed    [provider]  nitroGLYCERIN (NITROSTAT) 0.4 MG SL tablet DISSOLVE 1 TABLET UNDER THE TONGUE EVERY 5 MINUTES AS NEEDED FOR CHEST PAIN. 07/25/21   Leonie Man, MD  ondansetron (ZOFRAN) 4 MG tablet Take 1 tablet (4 mg total) by mouth every 6 (six) hours as needed for nausea. 09/26/21   Lajean Manes, MD     Critical care time: 95 minutes independent of procedures

## 2022-01-10 NOTE — Progress Notes (Signed)
Restraints discontinued on arrival to shift. Pt now adequately sedated; not reaching for lines/tubes/etc. Care ongoing.

## 2022-01-10 NOTE — Consult Note (Signed)
Renal Service Consult Note [facility]  Stephanie Franco 01/10/2022 [name], MD Requesting Physician: Dr. Charlsie Quest, CCM  Reason for Consult: Renal failure HPI: The patient is a 71 y.o. year-old w/ hx of anemia, breast cancer, CAD, HFrEF, CKD 3, DM2, gout, HL, HTN, morbid obesity, OSA, pHTN, severe mitral regurgitation w/ LVEF 20-25% who presented on 8/24 for planned mTEER. The surgery was aborted and pt admitted for observation. Then pt went into afib w/ RVR and became hypoxic. Then became progressively more hypotensive w/ dec'd UOP and more lethargic. Creatinine worsening overnight up to 3.60 from 2.44.  PICC was placed and pt started on milrinone and IV amiodarone, metoprolol was dc'd. Lasix ^'d and BP lowering meds were held. Pt had hx of CKD IV w/ b/l creatinine about 1.7- 2.1, eGFR 25- 31 ml /min. Then today pt found w/ agonal respirations and had PEA arrest. She got brief CPR and was admitted to ICU. We are asked by CCM to see pt for CRRT.   Pt seen in room. Pt on vent. Husband at bedsides, questions answered about CRRT.   ROS - n/a   Past Medical History  Past Medical History:  Diagnosis Date   Anxiety    Aortic atherosclerosis (HCC)    Arthritis    Asthma    Atrial fibrillation, permanent (HCC)    Rate control with Bystolic. CHA2DS2Vasc = 6 (HTN, DM, CHF, Age 37, Female) -> on Pradaxa   Breast cancer ([place]) 2008   S/P mastectomy   CAD S/P percutaneous coronary angioplasty 2006   PCI of circumflex with Taxus DES;; relook-cath Feb 2013: 50-60% short lesion in RCA, 40% ISR Circumflex stent.;  06/2021: Patent LCx stent.  Distal LCx 90% stenosis, proximal-mid RCA 60%.   CHF (congestive heart failure), NYHA class II, chronic, combined ([place])    CKD (chronic kidney disease), stage III (HCC)    Complication of anesthesia    prolonged sedation after colonoscopy in [place]   Diabetes mellitus, uncontrolled 07/14/2011   Dilated cardiomyopathy ([place]) 06/2021   2019: EF  down to 25% improved to 50 to 55% x 2020. => Recurrent cardiomyopathy 06/2021 => EF 25 to 30% on Echo, CMR showed severe BiV failure (LVEF 36%, diffuse HK; RVEF 32%).  Severe biatrial enlargement.  Moderate MR posterior directed.  Suspect functional (suggested by TEE); noncoronary pattern for delayed gadolinium-suggest myocarditis, & prior Ant MI-subendocardial => Mixed cardiomyopathy   Fibroid, uterine 07/13/2011   "have that now"   GERD (gastroesophageal reflux disease)    Gout    Hyperlipidemia    Hypertension    Hypokalemia    Morbid obesity ([place])    BMI 41   OSA on CPAP    On CPAP   Pneumonia 2018   Pulmonary hypertension, unspecified (HCC)    PAP ~90 mmHg on Echo 12/2017 -- has OSA on CPAP & obesity -> follow-up sleep study showed mild OSA.;  09/22/2021: RHC showed PAP of 86/30 mmHg with LVEDP of 14 mmHg.   Severe mitral regurgitation    Past Surgical History  Past Surgical History:  Procedure Laterality Date   Cardiac MRI  06/24/2021   Severe biventricular failure: LVEF 36%-diffuse HK. RV EF 32%. Severe BiA Enlargement. Mod MR. LGE in septum - non-coronary pattern -? Myocarditis. Anterior wall LGE is subendocardial - ? Prior MI.   COLONOSCOPY     CORONARY ANGIOPLASTY WITH STENT PLACEMENT  2006   stent to circumflex   DILATION AND CURETTAGE OF UTERUS  LEFT HEART CATHETERIZATION WITH CORONARY ANGIOGRAM N/A 07/13/2011   Procedure: LEFT HEART CATHETERIZATION WITH CORONARY ANGIOGRAM;  Surgeon: Leonie Man, MD;  Location: [facility] CATH LAB;  Service: Cardiovascular;  normal EF; 7m 50-60% lesion in RCA; patent stent in circumflex form 2006 with ~40% in-stent re-stenosis   MASTECTOMY  2001   left   MITRAL VALVE REPAIR N/A 12/21/2021   Procedure: MITRAL VALVE REPAIR;  Surgeon: TEarly Osmond MD;  [facility];  Service: Cardiovascular;  Laterality: N/A;   NM MYOVIEW LTD  7/'16; 6/'21   LOW RISK. No ischemia or infarction. Not gated 2/2  A. fib - unable to assess EF    RIGHT HEART CATH N/A 10/22/2021   Procedure: RIGHT HEART CATH;  Surgeon: MLarey Dresser MD;  [facility];  Service: Cardiovascular;  Laterality: N/A;   RIGHT/LEFT HEART CATH AND CORONARY ANGIOGRAPHY N/A 06/23/2021   Procedure: RIGHT/LEFT HEART CATH AND CORONARY ANGIOGRAPHY;  Surgeon: BLorretta Harp MD;  [facility];  Service: Cardiovascular; p-mRCA 60%.  Distal LCx 90%. mLAD stent patent.  RAP 22 RVP 70/1 mmHg with PAP 80/30 mmHg. LVEDP 14 mmHg-unable to assess PCWP.   TEE WITHOUT CARDIOVERSION N/A 06/25/2021   Procedure: TRANSESOPHAGEAL ECHOCARDIOGRAM (TEE);  Surgeon: MLarey Dresser MD;  [facility]; EF 35 to 40% with global HK.  Mildly reduced RV function.  Severely dilated left atrium with no LAA thrombus.  ?  Mild RA dilation.  Posterior MVL restriction w/ moderate eccentric/posterior directed MR.  No PV reversal. ~ functional MR 2/2 Afib.  Mild dilation of the AscAo (~ 40 mm)   TEE WITHOUT CARDIOVERSION N/A 10/22/2021   Procedure: TRANSESOPHAGEAL ECHOCARDIOGRAM (TEE);  Surgeon: MLarey Dresser MD;  [facility];  Service: Cardiovascular;  Laterality: N/A;   TEE WITHOUT CARDIOVERSION N/A 01/11/2022   Procedure: TRANSESOPHAGEAL ECHOCARDIOGRAM (TEE);  Surgeon: TEarly Osmond MD;  [place] [facility];  Service: Cardiovascular;  Laterality: N/A;   TOTAL HIP ARTHROPLASTY Right 01/19/2018   Procedure: RIGHT TOTAL HIP ARTHROPLASTY ANTERIOR APPROACH;  Surgeon: SRod Can MD;  Location: [facility] ORS;  Service: Orthopedics;  Laterality: Right;  Needs RNFA   TRANSTHORACIC ECHOCARDIOGRAM  6/'15; 9/'16   a) In setting of Urosepsis: EF ~25 %, global HK (poor quality study);; b) LV EF 40-45%, Mild Anteroseptal HK. Mod-Severe  RA dilation with elevated PA Pressures (~~peak 63 mmHg).. Mild LA dilation   TRANSTHORACIC ECHOCARDIOGRAM  12/2017; 04/15/2018   a) Improved LVEF to 45 to 50%.  Pulmonary HTN ~PAP ~ 60 mmHg. Severe LA dilation&  mild /rv dilation.; b) Mod LVH. EF 40-45% (back to prior EF). Unable to assess DF (Afib). Severe R&L Atrial enlargement.  Moderate Post-Lateral directed MR with Mod RV dilation.  PAP ~66 mmHg.;    TRANSTHORACIC ECHOCARDIOGRAM  01/2019   relook Echo: EF back up to 50 to 55%.  Indeterminate diastolic parameters.  Moderately dilated left atrium and right atrium.  Moderate MR.  Mild to moderate TR.   TRANSTHORACIC ECHOCARDIOGRAM  06/21/2021   EF 25 to 30%.  Severely reduced function.  Paradoxical septal motion consistent with LBBB.  Unable to assess diastolic pressures.  Severe biatrial dilation.  Mildly elevated PA P estimated 43.5 mmHg-with RAP estimated at 8 mmHg..  Moderate posteriorly directed MR.   Family History  Family History  Problem Relation Age of Onset   Coronary artery disease Mother    Hypertension Mother    Heart disease Father  Atrial fibrillation Son    Lung cancer Sister    Heart disease Brother    Heart disease Brother    Healthy Son    Thyroid disease Daughter    Healthy Daughter    Healthy Daughter    Social History  reports that she quit smoking about 29 years ago. Her smoking use included cigarettes. She has a 3.00 pack-year smoking history. She has never used smokeless tobacco. She reports that she does not drink alcohol and does not use drugs. Allergies No Known Allergies Home medications Prior to Admission medications   Medication Sig Start Date End Date Taking? Authorizing Provider  acetaminophen (TYLENOL) 500 MG tablet Take 1,000 mg by mouth every 6 (six) hours as needed for mild pain.   Yes [provider]  albuterol (PROVENTIL HFA;VENTOLIN HFA) 108 (90 Base) MCG/ACT inhaler Inhale 1-2 puffs into the lungs every 6 (six) hours as needed for wheezing or shortness of breath. 05/20/15  Yes Harvel Quale, MD  allopurinol (ZYLOPRIM) 300 MG tablet TAKE 1 TABLET BY MOUTH EVERY DAY AS NEEDED Patient taking differently: Take 300 mg by mouth every evening.  11/17/21  Yes Irene Pap, PA-C  Biotin 1000 MCG tablet Take 1,000 mcg by mouth daily.   Yes [provider]  ELIQUIS 5 MG TABS tablet TAKE 1 TABLET BY MOUTH TWICE A DAY 07/11/19  Yes Leonie Man, MD  ezetimibe (ZETIA) 10 MG tablet Take 1 tablet (10 mg total) by mouth daily. 06/27/21  Yes Simmons, Brittainy M, PA-C  FARXIGA 10 MG TABS tablet TAKE 1 TABLET BY MOUTH DAILY 12/17/21  Yes Lyda Jester M, PA-C  gabapentin (NEURONTIN) 600 MG tablet TAKE 1 TABLET BY MOUTH THREE TIMES A DAY 12/10/21  Yes Tysinger, Camelia Eng, PA-C  hydrALAZINE (APRESOLINE) 25 MG tablet Take 0.5 tablets (12.5 mg total) by mouth 3 (three) times daily. 10/07/21  Yes Larey Dresser, MD  Hydrocortisone, Perianal, 1 % CREA Apply 1 application. topically 3 (three) times daily as needed (irritation). 09/18/21  Yes [provider]  isosorbide mononitrate (IMDUR) 60 MG 24 hr tablet Take 60 mg by mouth daily.   Yes [provider]  letrozole (FEMARA) 2.5 MG tablet Take 1 tablet (2.5 mg total) by mouth daily. 10/02/21  Yes Owens Shark, NP  LORazepam (ATIVAN) 1 MG tablet Take 1 mg by mouth 2 (two) times daily as needed for anxiety. 07/07/16  Yes [provider]  metolazone (ZAROXOLYN) 2.5 MG tablet Take 1 tablet (2.5 mg total) by mouth once a week. On Mondays. With an extra 2 tablets of potassium 12/29/21  Yes Milford, Maricela Bo, FNP  Multiple Vitamins-Minerals (MULTIVITAMIN WITH MINERALS) tablet Take 1 tablet by mouth daily.   Yes [provider]  Naphazoline-Glycerin (CLEAR EYES MAX REDNESS RELIEF OP) Place 2 drops into both eyes daily as needed (redness).   Yes [provider]  nystatin (MYCOSTATIN/NYSTOP) powder Apply 1 Application topically 2 (two) times daily as needed (rash).   Yes [provider]  Oxycodone HCl 10 MG TABS Take 0.5 tablets (5 mg total) by mouth 3 (three) times daily as needed. For pain 12/24/21  Yes Owens Shark, NP  pantoprazole (PROTONIX) 40 MG  tablet TAKE 1 TABLET BY MOUTH EVERY DAY 04/02/21  Yes Leonie Man, MD  potassium chloride SA (KLOR-CON M) 20 MEQ tablet Take 3 tablets by mouth in the morning and evening Patient taking differently: Take 60-80 mEq by mouth See admin instructions. Take 60 meq  twice daily except on Mondays take 80 meq twice daily (with the metolazone) 12/16/21  Yes Milford, Maricela Bo, FNP  rosuvastatin (CRESTOR) 40 MG tablet Take 40 mg by mouth daily.   Yes [provider]  senna-docusate (SENOKOT-S) 8.6-50 MG tablet Take 1 tablet by mouth 2 (two) times daily. 09/26/21  Yes Lajean Manes, MD  spironolactone (ALDACTONE) 25 MG tablet Take 1 tablet (25 mg total) by mouth daily. 09/26/21  Yes Lajean Manes, MD  torsemide (DEMADEX) 20 MG tablet Take 60 mg by mouth 2 (two) times daily.   Yes [provider]  TOUJEO SOLOSTAR 300 UNIT/ML Solostar Pen Inject 30 Units into the skin 2 (two) times daily. 07/28/21  Yes [provider]  triamcinolone ointment (KENALOG) 0.5 % Apply 1 Application topically 2 (two) times daily as needed (rash/itching).   Yes [provider]  fexofenadine (ALLEGRA) 180 MG tablet Take 180 mg by mouth daily as needed for allergies.  04/30/15   [provider]  glucose blood test strip 1 each by Other route as needed for other. Use as instructed    [provider]  nitroGLYCERIN (NITROSTAT) 0.4 MG SL tablet DISSOLVE 1 TABLET UNDER THE TONGUE EVERY 5 MINUTES AS NEEDED FOR CHEST PAIN. 07/25/21   Leonie Man, MD  ondansetron (ZOFRAN) 4 MG tablet Take 1 tablet (4 mg total) by mouth every 6 (six) hours as needed for nausea. 09/26/21   Lajean Manes, MD     Vitals:   01/10/22 2045 01/10/22 2100 01/10/22 2106 01/10/22 2115  BP:      Pulse: 62 61  60  Resp: _0 Temp:      TempSrc:      SpO2: 100% 100% 100% 100%  Weight:      Height:       Exam  Gen on vent, sedated No rash, cyanosis or gangrene Sclera anicteric, throat w/ ETT No jvd or  bruits Chest clear anterior/ lateral RRR no MRG Abd soft ntnd no mass or ascites +bs GU foley cath in place MS no joint effusions or deformity Ext 2+ bilat LE edema, no UE edema, no wounds or ulcers Neuro is on vent, sedated    RIJ temp HD cath in place   Home meds include - albuterol, allopurinol, eliquis, ezetimibe, farxiga, gabapentin, hydralazine 2.5 tid, isosorbide mononitrate 60, letrozole, lorazepam prn, MVI, oxycodone IR, pantoprazole, klor-con 60-80 meq, rosuvastatin, spironolactone 25 qd, torsemide 35m bid, toujeo insulin 30u bid, prns/ vits/ supps    CXR 8/26 - IMPRESSION: Stable cardiomegaly and mild pulmonary vascular congestion     Na 135  K+ 4.9  CO2 23  BUN 52  Cr 3.60  CA 8.6  WBC 10k  Hb 8.5     Assessment/ Plan: AKI on CKD 4 - b/l creat 1.7- 2.1 from mid 2023, eGFR 17- 21 ml/min. Creat here bumped up to 3.60 after acute decline and brief cardiac arrest. Moved to ICU on milrinone now and 2 vasopressors. AKI likely due to cardiogenic shock. CRRT to be started this evening.  Acute hypoxic resp failure/ cardiac arrest - per CCM Severe CHF  Severe mitral regurgitation DM2 on insulin HTN - by history, in shock now Breast cancer - recurrence w/ mets to bone per the pts husband   RKelly Splinter MD 01/10/2022, 9:48 PM Recent Labs  Lab 01/05/22 1040 01/13/2022 2049 01/09/22 0034 01/10/22 0106 01/10/22 1719 01/10/22 1744  HGB 10.3*   < >  --  8.5*  8.5* 9.5*  ALBUMIN 3.4*  --   --   --   --   --   CALCIUM 9.6  --  8.4* 8.6*  --   --   CREATININE 2.44*  --  2.47* 3.60*  --   --   K 3.6  --  3.8 4.9  --  4.8   < > = values in this interval not displayed.

## 2022-01-11 ENCOUNTER — Inpatient Hospital Stay (HOSPITAL_COMMUNITY): Payer: Medicare Other

## 2022-01-11 DIAGNOSIS — I340 Nonrheumatic mitral (valve) insufficiency: Secondary | ICD-10-CM | POA: Diagnosis not present

## 2022-01-11 DIAGNOSIS — R570 Cardiogenic shock: Secondary | ICD-10-CM | POA: Diagnosis not present

## 2022-01-11 DIAGNOSIS — L8990 Pressure ulcer of unspecified site, unspecified stage: Secondary | ICD-10-CM | POA: Insufficient documentation

## 2022-01-11 LAB — CBC
HCT: 28.5 % — ABNORMAL LOW (ref 36.0–46.0)
HCT: 27.7 % — ABNORMAL LOW (ref 36.0–46.0)
Hemoglobin: 8.8 g/dL — ABNORMAL LOW (ref 12.0–15.0)
Hemoglobin: 8.5 g/dL — ABNORMAL LOW (ref 12.0–15.0)
MCH: 26.1 pg (ref 26.0–34.0)
MCH: 26 pg (ref 26.0–34.0)
MCHC: 30.9 g/dL (ref 30.0–36.0)
MCHC: 30.7 g/dL (ref 30.0–36.0)
MCV: 84.6 fL (ref 80.0–100.0)
MCV: 84.7 fL (ref 80.0–100.0)
Platelets: 228 10*3/uL (ref 150–400)
Platelets: 225 10*3/uL (ref 150–400)
RBC: 3.37 MIL/uL — ABNORMAL LOW (ref 3.87–5.11)
RBC: 3.27 MIL/uL — ABNORMAL LOW (ref 3.87–5.11)
RDW: 19.3 % — ABNORMAL HIGH (ref 11.5–15.5)
RDW: 19.3 % — ABNORMAL HIGH (ref 11.5–15.5)
WBC: 13.5 10*3/uL — ABNORMAL HIGH (ref 4.0–10.5)
WBC: 15.1 10*3/uL — ABNORMAL HIGH (ref 4.0–10.5)
nRBC: 1.6 % — ABNORMAL HIGH (ref 0.0–0.2)
nRBC: 1.1 % — ABNORMAL HIGH (ref 0.0–0.2)

## 2022-01-11 LAB — MAGNESIUM: Magnesium: 1.9 mg/dL (ref 1.7–2.4)

## 2022-01-11 LAB — RENAL FUNCTION PANEL
Albumin: 2.9 g/dL — ABNORMAL LOW (ref 3.5–5.0)
Albumin: 2.8 g/dL — ABNORMAL LOW (ref 3.5–5.0)
Anion gap: 13 (ref 5–15)
Anion gap: 11 (ref 5–15)
BUN: 39 mg/dL — ABNORMAL HIGH (ref 8–23)
BUN: 24 mg/dL — ABNORMAL HIGH (ref 8–23)
CO2: 23 mmol/L (ref 22–32)
CO2: 24 mmol/L (ref 22–32)
Calcium: 8.3 mg/dL — ABNORMAL LOW (ref 8.9–10.3)
Calcium: 8.2 mg/dL — ABNORMAL LOW (ref 8.9–10.3)
Chloride: 99 mmol/L (ref 98–111)
Chloride: 100 mmol/L (ref 98–111)
Creatinine, Ser: 2.88 mg/dL — ABNORMAL HIGH (ref 0.44–1.00)
Creatinine, Ser: 2.03 mg/dL — ABNORMAL HIGH (ref 0.44–1.00)
GFR, Estimated: 17 mL/min — ABNORMAL LOW (ref >60)
GFR, Estimated: 26 mL/min — ABNORMAL LOW (ref >60)
Glucose, Bld: 133 mg/dL — ABNORMAL HIGH (ref 70–99)
Glucose, Bld: 93 mg/dL (ref 70–99)
Phosphorus: 3.9 mg/dL (ref 2.5–4.6)
Phosphorus: 3.1 mg/dL (ref 2.5–4.6)
Potassium: 4.2 mmol/L (ref 3.5–5.1)
Potassium: 4.4 mmol/L (ref 3.5–5.1)
Sodium: 135 mmol/L (ref 135–145)
Sodium: 135 mmol/L (ref 135–145)

## 2022-01-11 LAB — HEPARIN LEVEL (UNFRACTIONATED): Heparin Unfractionated: >1.1 IU/mL — ABNORMAL HIGH (ref 0.30–0.70)

## 2022-01-11 LAB — TRIGLYCERIDES: Triglycerides: 51 mg/dL (ref <150)

## 2022-01-11 LAB — GLUCOSE, CAPILLARY
Glucose-Capillary: 104 mg/dL — ABNORMAL HIGH (ref 70–99)
Glucose-Capillary: 121 mg/dL — ABNORMAL HIGH (ref 70–99)
Glucose-Capillary: 122 mg/dL — ABNORMAL HIGH (ref 70–99)
Glucose-Capillary: 81 mg/dL (ref 70–99)
Glucose-Capillary: 82 mg/dL (ref 70–99)
Glucose-Capillary: 97 mg/dL (ref 70–99)

## 2022-01-11 LAB — COOXEMETRY PANEL
Carboxyhemoglobin: 1.4 % (ref 0.5–1.5)
Methemoglobin: 1 % (ref 0.0–1.5)
O2 Saturation: 73.5 %
Total hemoglobin: 10.6 g/dL — ABNORMAL LOW (ref 12.0–16.0)

## 2022-01-11 LAB — APTT
aPTT: 146 seconds — ABNORMAL HIGH (ref 24–36)
aPTT: 114 seconds — ABNORMAL HIGH (ref 24–36)
aPTT: 115 seconds — ABNORMAL HIGH (ref 24–36)

## 2022-01-11 MED ORDER — HEPARIN (PORCINE) 25000 UT/250ML-% IV SOLN
500.0000 [IU]/h | INTRAVENOUS | Status: DC
  Administered 2022-01-11: 900 [IU]/h via INTRAVENOUS
  Administered 2022-01-12: 700 [IU]/h via INTRAVENOUS
  Administered 2022-01-14 – 2022-01-18 (×3): 500 [IU]/h via INTRAVENOUS
  Filled 2022-01-11 (×4): qty 250

## 2022-01-11 MED ORDER — PANTOPRAZOLE SODIUM 40 MG IV SOLR
40.0000 mg | Freq: Two times a day (BID) | INTRAVENOUS | Status: DC
  Administered 2022-01-11 – 2022-01-17 (×14): 40 mg via INTRAVENOUS
  Filled 2022-01-11 (×14): qty 10

## 2022-01-11 MED ORDER — EZETIMIBE 10 MG PO TABS
10.0000 mg | ORAL_TABLET | Freq: Every day | ORAL | Status: DC
  Administered 2022-01-12 – 2022-01-17 (×6): 10 mg
  Filled 2022-01-11 (×6): qty 1

## 2022-01-11 MED ORDER — DOBUTAMINE IN D5W 4-5 MG/ML-% IV SOLN
5.0000 ug/kg/min | INTRAVENOUS | Status: DC
Start: 2022-01-11 — End: 2022-01-19
  Administered 2022-01-11 – 2022-01-17 (×5): 5 ug/kg/min via INTRAVENOUS
  Filled 2022-01-11 (×5): qty 250

## 2022-01-11 MED ORDER — ADULT MULTIVITAMIN W/MINERALS CH
1.0000 | ORAL_TABLET | Freq: Every day | ORAL | Status: DC
Start: 2022-01-12 — End: 2022-01-12
  Administered 2022-01-12: 1
  Filled 2022-01-11: qty 1

## 2022-01-11 MED ORDER — ALLOPURINOL 100 MG PO TABS
50.0000 mg | ORAL_TABLET | Freq: Every evening | ORAL | Status: DC
  Administered 2022-01-11 – 2022-01-17 (×7): 50 mg
  Filled 2022-01-11 (×6): qty 1

## 2022-01-11 MED ORDER — SODIUM CHLORIDE 0.9 % IV SOLN
2.0000 g | Freq: Two times a day (BID) | INTRAVENOUS | Status: DC
  Administered 2022-01-11 – 2022-01-13 (×4): 2 g via INTRAVENOUS
  Filled 2022-01-11 (×4): qty 12.5

## 2022-01-11 MED ORDER — ACETAMINOPHEN 325 MG PO TABS
650.0000 mg | ORAL_TABLET | ORAL | Status: DC | PRN
Start: 2022-01-11 — End: 2022-01-19
  Administered 2022-01-17: 650 mg
  Filled 2022-01-11: qty 2

## 2022-01-11 MED ORDER — SENNOSIDES-DOCUSATE SODIUM 8.6-50 MG PO TABS
1.0000 | ORAL_TABLET | Freq: Two times a day (BID) | ORAL | Status: DC
  Administered 2022-01-11 – 2022-01-15 (×8): 1
  Filled 2022-01-11 (×11): qty 1

## 2022-01-11 MED ORDER — OXYCODONE HCL 5 MG PO TABS
5.0000 mg | ORAL_TABLET | ORAL | Status: DC | PRN
  Administered 2022-01-11 – 2022-01-13 (×3): 5 mg
  Filled 2022-01-11 (×5): qty 1

## 2022-01-11 MED ORDER — ROSUVASTATIN CALCIUM 20 MG PO TABS
40.0000 mg | ORAL_TABLET | Freq: Every day | ORAL | Status: DC
Start: 2022-01-12 — End: 2022-01-12

## 2022-01-11 NOTE — Progress Notes (Signed)
Pharmacy Antibiotic Note  Stephanie Franco is a 71 y.o. female admitted on 01/06/2022 with failed mitraclip c/b CHF and shock. Pharmacy has been consulted for cefepime dosing to empirically cover infection with pressor requirements worsening. Pt is on CRRT.  Plan: Cefepime 2g IV q12h F/U Cx, RRT plans  Height: '5\' 3"'$  (160 cm) Weight: 78.9 kg (173 lb 15.1 oz) IBW/kg (Calculated) : 52.4  Temp (24hrs), Avg:97.6 F (36.4 C), Min:93 F (33.9 C), Max:99.1 F (37.3 C)  Recent Labs  Lab 01/05/22 1040 12/30/2021 2049 01/09/22 0034 01/10/22 0106 01/10/22 1719 01/11/22 0357 01/11/22 1610  WBC 8.3 10.0  --  8.2 10.7* 13.5* 15.1*  CREATININE 2.44*  --  2.47* 3.60*  --  2.88* 2.03*  LATICACIDVEN  --   --   --   --  1.6  --   --     Estimated Creatinine Clearance: 24.9 mL/min (A) (by C-G formula based on SCr of 2.03 mg/dL (H)).    No Known Allergies   Arrie Senate, PharmD, BCPS, [facility] Clinical Pharmacist [phone] Please check AMION for all [place] numbers 01/11/2022

## 2022-01-11 NOTE — Progress Notes (Signed)
[facility] Progress Note Patient Name: Stephanie Franco DOB: 04/26/1951 MRN: [id number]   Date of Service  01/11/2022  HPI/Events of Note  Notified of leukocytosis. Ongoing CRRT Briefly: Failed mitral clipping via atrial septostomy 8/24. Worsening mentation and respiratory status with brief PEA arrest post intubation 8/26. Now with increasing pressor requirement  eICU Interventions  Start cefepime after cultures obtained No clear source of infection but with worsening shock prudent to start antibiotics for now pending cultures Discussed with bedside CCM team     Intervention Category Intermediate Interventions: Other:  Judd Lien 01/11/2022, 9:18 PM

## 2022-01-11 NOTE — Progress Notes (Signed)
[facility] Progress Note  Subjective: net negative 1.6 L w/ CRRT yesterday  Vitals:   01/11/22 1117 01/11/22 1118 01/11/22 1119 01/11/22 1120  BP:      Pulse: (!) 120 (!) 114 (!) 102 100  Resp: 18 (!) 22 19 18   Temp:    98.6 F (37 C)  TempSrc:      SpO2: 95% 96% 99% 97%  Weight:      Height:        Exam: Gen on vent, more alert Sclera anicteric, throat w/ ETT No jvd or bruits Chest clear anterior/ lateral RRR no MRG Abd soft ntnd no mass or ascites +bs GU foley cath in place Ext 2+ bilat LE edema Neuro is on vent, sedated    RIJ temp HD cath in place    Home meds include - albuterol, allopurinol, eliquis, ezetimibe, farxiga, gabapentin, hydralazine 2.5 tid, isosorbide mononitrate 60, letrozole, lorazepam prn, MVI, oxycodone IR, pantoprazole, klor-con 60-80 meq, rosuvastatin, spironolactone 25 qd, torsemide 71m bid, toujeo insulin 30u bid, prns/ vits/ supps     CXR 8/26 - IMPRESSION: Stable cardiomegaly and mild pulmonary vascular congestion   Summary:  71y.o. year-old w/ hx of anemia, breast cancer, CAD, HFrEF, CKD 3, DM2, gout, HL, HTN, morbid obesity, OSA, pHTN, severe mitral regurgitation w/ LVEF 20-25% who presented on 8/24 for planned mTEER for the MR. The surgery was aborted and pt admitted for observation. Then pt went into afib w/ RVR and became hypoxic. Then became progressively more hypotensive w/ dec'd UOP. Creat increased to 3.60 from 2.44. Pt started on milrinone and amiodarone. Lasix was ^'d and BP lowering meds were held. Pt had hx of CKD IV. Pt then found w/ agonal respirations and had PEA arrest. She got brief CPR and was admitted to ICU and we were called to see for CRRT.   Assessment/ Plan: AKI on CKD 4 - b/l creat 1.7- 2.1 from mid 2023, eGFR 17- 21 ml/min. Creat here bumped up to 3.60 after acute decline/ cardiac arrest. Moved to ICU on milrinone and 2 vasopressors. AKI likely due to cardiogenic shock. CRRT started on 8/26. Continue CRRT.   Severe CHF/ severe MR/ EF 20-25%- vol overload, CHF team is following. Tolerating about 75-100 cc /hr net negative UF, continue.  Acute hypoxic resp failure- per CCM Severe mitral regurgitation - repair procedure was aborted 8/24 DM2 on insulin HTN - by history, in shock now Breast cancer - recurrence w/ mets to bone     Rob Vi Whitesel 01/11/2022, 11:28 AM   Recent Labs  Lab 01/05/22 1040 01/15/2022 2049 01/10/22 0106 01/10/22 1719 01/10/22 1744 01/11/22 0357  HGB 10.3*   < > 8.5*   < > 9.5* 8.8*  ALBUMIN 3.4*  --   --   --   --  2.9*  CALCIUM 9.6   < > 8.6*  --   --  8.3*  PHOS  --   --   --   --   --  3.9  CREATININE 2.44*   < > 3.60*  --   --  2.88*  K 3.6   < > 4.9  --  4.8 4.2   < > = values in this interval not displayed.   No results for input(s): "IRON", "TIBC", "FERRITIN" in the last 168 hours. Inpatient medications:  allopurinol  50 mg Per Tube QPM   Chlorhexidine Gluconate Cloth  6 each Topical Daily   docusate  100 mg Per Tube BID   [  START ON 01/03/2022] ezetimibe  10 mg Per Tube Daily   insulin aspart  0-15 Units Subcutaneous Q4H   insulin glargine-yfgn  30 Units Subcutaneous QHS   [START ON 01/07/2022] multivitamin with minerals  1 tablet Per Tube Daily   mouth rinse  15 mL Mouth Rinse Q2H   pantoprazole (PROTONIX) IV  40 mg Intravenous Q12H   polyethylene glycol  17 g Per Tube Daily   [START ON 01/14/2022] rosuvastatin  40 mg Per Tube Daily   senna-docusate  1 tablet Per Tube BID   sodium chloride flush  10-40 mL Intracatheter Q12H   sodium chloride flush  3 mL Intravenous Q12H     prismasol BGK 4/2.5 400 mL/hr at 01/10/22 2028    prismasol BGK 4/2.5 400 mL/hr at 01/10/22 2027   sodium chloride Stopped (01/10/22 2245)   amiodarone 30 mg/hr (01/11/22 1125)   DOBUTamine 5 mcg/kg/min (01/11/22 1000)   heparin 900 Units/hr (01/11/22 1016)   norepinephrine (LEVOPHED) Adult infusion 42 mcg/min (01/11/22 1000)   prismasol BGK 4/2.5 1,500 mL/hr at 01/11/22 0201    propofol (DIPRIVAN) infusion 15 mcg/kg/min (01/11/22 1000)   vasopressin 0.03 Units/min (01/11/22 0400)   sodium chloride, acetaminophen, albuterol, alteplase, fentaNYL (SUBLIMAZE) injection, heparin, LORazepam, naphazoline-glycerin, ondansetron (ZOFRAN) IV, mouth rinse, oxyCODONE, sodium chloride, sodium chloride flush, sodium chloride flush

## 2022-01-11 NOTE — Progress Notes (Signed)
ANTICOAGULATION CONSULT NOTE  Pharmacy Consult for heparin Indication: atrial fibrillation  No Known Allergies  Patient Measurements: Height: '5\' 3"'$  (160 cm) Weight: 78.9 kg (173 lb 15.1 oz) IBW/kg (Calculated) : 52.4 Heparin Dosing Weight: 81kg  Vital Signs: Temp: 97.7 F (36.5 C) (08/27 0700) Temp Source: Esophageal (08/27 0400) BP: 101/53 (08/27 0342) Pulse Rate: 90 (08/27 0700)  Labs: Recent Labs    01/09/22 0034 01/10/22 0106 01/10/22 1719 01/10/22 1744 01/11/22 0357 01/11/22 0628  HGB  --  8.5* 8.5* 9.5* 8.8*  --   HCT  --  28.4* 28.4* 28.0* 28.5*  --   PLT  --  181 210  --  228  --   APTT  --   --   --   --   --  146*  HEPARINUNFRC  --   --   --   --   --  >1.10*  CREATININE 2.47* 3.60*  --   --  2.88*  --      Estimated Creatinine Clearance: 17.6 mL/min (A) (by C-G formula based on SCr of 2.88 mg/dL (H)).   Medical History: Past Medical History:  Diagnosis Date   Anxiety    Aortic atherosclerosis (HCC)    Arthritis    Asthma    Atrial fibrillation, permanent (HCC)    Rate control with Bystolic. CHA2DS2Vasc = 6 (HTN, DM, CHF, Age 71, Female) -> on Pradaxa   Breast cancer ([place]) 2008   S/P mastectomy   CAD S/P percutaneous coronary angioplasty 2006   PCI of circumflex with Taxus DES;; relook-cath Feb 2013: 50-60% short lesion in RCA, 40% ISR Circumflex stent.;  06/2021: Patent LCx stent.  Distal LCx 90% stenosis, proximal-mid RCA 60%.   CHF (congestive heart failure), NYHA class II, chronic, combined ([place])    CKD (chronic kidney disease), stage III (HCC)    Complication of anesthesia    prolonged sedation after colonoscopy in [place]   Diabetes mellitus, uncontrolled 07/14/2011   Dilated cardiomyopathy ([place]) 06/2021   2019: EF down to 25% improved to 50 to 55% x 2020. => Recurrent cardiomyopathy 06/2021 => EF 25 to 30% on Echo, CMR showed severe BiV failure (LVEF 36%, diffuse HK; RVEF 32%).  Severe biatrial enlargement.  Moderate MR posterior directed.   Suspect functional (suggested by TEE); noncoronary pattern for delayed gadolinium-suggest myocarditis, & prior Ant MI-subendocardial => Mixed cardiomyopathy   Fibroid, uterine 07/13/2011   "have that now"   GERD (gastroesophageal reflux disease)    Gout    Hyperlipidemia    Hypertension    Hypokalemia    Morbid obesity ([place])    BMI 41   OSA on CPAP    On CPAP   Pneumonia 2018   Pulmonary hypertension, unspecified (HCC)    PAP ~90 mmHg on Echo 12/2017 -- has OSA on CPAP & obesity -> follow-up sleep study showed mild OSA.;  09/22/2021: RHC showed PAP of 86/30 mmHg with LVEDP of 14 mmHg.   Severe mitral regurgitation      Assessment: 36 yoF s/p unsuccessful TEER for mitral valve. Pt on apixaban PTA for hx AFib, now s/p brief cardiac arrest and pharmacy asked to transition to IV heparin. Last apixaban dose was 8/26 1050.  Heparin level >1 as expected with recent apixaban. Aptt however is also greatly elevated at 146s and 114s on recheck. No bleeding issues noted, no heparin being used in the circuit. Heparin held for an hour and rate adjusted.   Goal of Therapy:  Heparin level 0.3-0.7  units/ml aPTT 66-102 seconds Monitor platelets by anticoagulation protocol: Yes   Plan:  Restart heparin at 900 units/hr Aptt in 8 hours then daily with heparin level  Erin Hearing PharmD., BCPS Clinical Pharmacist 01/11/2022 8:29 AM

## 2022-01-11 NOTE — Progress Notes (Signed)
[facility] Progress Note Patient Name: Stephanie Franco DOB: November 20, 1950 MRN: [id number]   Date of Service  01/11/2022  HPI/Events of Note  SBP remains 90s, but MAP dropping below 65  eICU Interventions  Increased ceiling dose of levophed to 40mg/min Would also increase vasopressin to 0.04 units/min        Keyonia Gluth M DELA CRUZ 01/11/2022, 5:07 AM

## 2022-01-11 NOTE — Progress Notes (Signed)
ANTICOAGULATION CONSULT NOTE  Pharmacy Consult for heparin Indication: atrial fibrillation  No Known Allergies  Patient Measurements: Height: '5\' 3"'$  (160 cm) Weight: 78.9 kg (173 lb 15.1 oz) IBW/kg (Calculated) : 52.4 Heparin Dosing Weight: 81kg  Vital Signs: Temp: 99.1 F (37.3 C) (08/27 2000) Temp Source: Esophageal (08/27 1200) BP: 105/54 (08/27 1946) Pulse Rate: 106 (08/27 2000)  Labs: Recent Labs    01/10/22 0106 01/10/22 1719 01/10/22 1744 01/11/22 0357 01/11/22 0628 01/11/22 0849 01/11/22 1610 01/11/22 1837  HGB 8.5* 8.5* 9.5* 8.8*  --   --  8.5*  --   HCT 28.4* 28.4* 28.0* 28.5*  --   --  27.7*  --   PLT 181 210  --  228  --   --  225  --   APTT  --   --   --   --  146* 114*  --  115*  HEPARINUNFRC  --   --   --   --  >1.10*  --   --   --   CREATININE 3.60*  --   --  2.88*  --   --  2.03*  --      Estimated Creatinine Clearance: 24.9 mL/min (A) (by C-G formula based on SCr of 2.03 mg/dL (H)).   Medical History: Past Medical History:  Diagnosis Date   Anxiety    Aortic atherosclerosis (HCC)    Arthritis    Asthma    Atrial fibrillation, permanent (HCC)    Rate control with Bystolic. CHA2DS2Vasc = 6 (HTN, DM, CHF, Age 71, Female) -> on Pradaxa   Breast cancer ([place]) 2008   S/P mastectomy   CAD S/P percutaneous coronary angioplasty 2006   PCI of circumflex with Taxus DES;; relook-cath Feb 2013: 50-60% short lesion in RCA, 40% ISR Circumflex stent.;  06/2021: Patent LCx stent.  Distal LCx 90% stenosis, proximal-mid RCA 60%.   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 => Recurrent cardiomyopathy 06/2021 => EF 25 to 30% on Echo, CMR showed severe BiV failure (LVEF 36%,  diffuse HK; RVEF 32%).  Severe biatrial enlargement.  Moderate MR posterior directed.  Suspect functional (suggested by TEE); noncoronary pattern for delayed gadolinium-suggest myocarditis, & prior Ant MI-subendocardial => Mixed cardiomyopathy   Fibroid, uterine 07/13/2011   "have that now"   GERD (gastroesophageal reflux disease)    Gout    Hyperlipidemia    Hypertension    Hypokalemia    Morbid obesity ([place])    BMI 41   OSA on CPAP    On CPAP   Pneumonia 2018   Pulmonary hypertension, unspecified (HCC)    PAP ~90 mmHg on Echo 12/2017 -- has OSA on CPAP & obesity -> follow-up sleep study showed mild OSA.;  09/22/2021: RHC showed PAP of 86/30 mmHg with LVEDP of 14 mmHg.   Severe mitral regurgitation      Assessment: 58 yoF s/p unsuccessful TEER for mitral valve. Pt on apixaban PTA for hx AFib, now s/p brief cardiac arrest and pharmacy asked to transition to IV heparin. Last apixaban dose was 8/26 1050.  Repeat aPTT remains elevated at 115 seconds.  Goal of Therapy:  Heparin level 0.3-0.7 units/ml aPTT 66-102  seconds Monitor platelets by anticoagulation protocol: Yes   Plan:  Reduce heparin to 700 units/h Recheck heparin level and aPTT in 8h  Arrie Senate, PharmD, [place], [facility] Clinical Pharmacist [phone] Please check AMION for all [place] numbers 01/11/2022

## 2022-01-11 NOTE — Progress Notes (Signed)
NAME:  Stephanie Franco, MRN:  [id number], DOB:  [birth year], LOS: 3 ADMISSION DATE:  12/21/2021, CONSULTATION DATE:  01/10/22 REFERRING MD:  Debara Pickett, CHIEF COMPLAINT:  SOB   History of Present Illness:  71 year old woman with hx of afib on eliquis, NICM, CKD, DM2, HLD, HTN p/w symptomatic sevre mitral regurgitation manifested by DOE.  She had attempted mitral clipping via atrial septostomy on 01/14/2022 that was aborted due to inability to cross septum.  Subsequently has developed worsening oliguria and today more somnolence.  Initially PCCM consulted for central access but patient has deteriorated further with brief cardiac arrest due to hypoxemia requiring intubation and transfer to ICU.   Pertinent  Medical History    has a past medical history of Anxiety, Aortic atherosclerosis ([place]), Arthritis, Asthma, Atrial fibrillation, permanent ([place]), Breast cancer ([place]) (2008), CAD S/P percutaneous coronary angioplasty (2006), CHF (congestive heart failure), NYHA class II, chronic, combined ([place]), CKD (chronic kidney disease), stage III ([place]), Complication of anesthesia, Diabetes mellitus, uncontrolled (07/14/2011), Dilated cardiomyopathy ([place]) (06/2021), Fibroid, uterine (07/13/2011), GERD (gastroesophageal reflux disease), Gout, Hyperlipidemia, Hypertension, Hypokalemia, Morbid obesity ([place]), OSA on CPAP, Pneumonia (2018), Pulmonary hypertension, unspecified ([place]), and Severe mitral regurgitation.   Significant Hospital Events: Including procedures, antibiotic start and stop dates in addition to other pertinent events   8/24 admit, failed mitraclip 8/25 worsening BPs, worsening renal function 8/26 PCCM consult, central line placed, brief code, transfer to ICU intubated, started on CVVH  Interim History / Subjective:   Continues on CVVH, multiple pressors.  Milrinone changed to dobutamine On inhaled nitric oxide  Objective   Blood pressure (!) 101/53, pulse (!) 108, temperature 98.6 F (37 C), resp.  rate 16, height '5\' 3"'$  (1.6 m), weight 78.9 kg, SpO2 100 %. CVP:  [2 mmHg-30 mmHg] 12 mmHg  Vent Mode: PRVC FiO2 (%):  [50 %-100 %] 50 % Set Rate:  [20 bmp-24 bmp] 20 bmp Vt Set:  [420 mL] 420 mL PEEP:  [8 cmH20] 8 cmH20 Plateau Pressure:  [19 cmH20-26 cmH20] 19 cmH20   Intake/Output Summary (Last 24 hours) at 01/11/2022 8546 Last data filed at 01/11/2022 0900 Gross per 24 hour  Intake 1574.44 ml  Output 2827 ml  Net -1252.56 ml   Filed Weights   12/17/2021 0902 01/11/22 0500  Weight: 81.2 kg 78.9 kg    Examination: Gen:      No acute distress, obese HEENT:  EOMI, sclera anicteric Neck:     No masses; no thyromegaly, ET tube Lungs:    Clear to auscultation bilaterally; normal respiratory effort CV:         Regular rate and rhythm; no murmurs Abd:      + bowel sounds; soft, non-tender; no palpable masses, no distension Ext:    No edema; adequate peripheral perfusion Skin:      Warm and dry; no rash Neuro: Awake, responds to questions  Labs/imaging reviewed Significant for BUN/creatinine 39/2.88 WBC 13.5, hemoglobin 8.8, platelets 228 Chest x-ray reviewed with cardiomegaly, pulmonary vascular congestion  Resolved Hospital Problem list   N/A  Assessment & Plan:  Acute on chronic systolic heart failure ischemic cardiomyopathy Atrial fibrillation, severe MR s/p failed MitraClip procedure Continue volume removal with CVVH Milrinone switched to dobutamine Wean norepinephrine and vasopressin as able Advanced heart failure team is on board Heparin for atrial fibrillation.  Severe mixed venous, arterial pulmonary hypertension PVR 11 on prior cardiac cath with elevated right and left heart filling pressures Continue inhaled nitric oxide Diuresis as above  AKI on chronic kidney disease On CVVH with volume removal  Husband and daughter updated at bedside  Best Practice (right click and "Reselect all SmartList Selections" daily)   Diet/type: NPO DVT prophylaxis: systemic  heparin GI prophylaxis: PPI Lines: Central line Foley:  Yes, and it is still needed Code Status:  full code Last date of multidisciplinary goals of care discussion [pending]  Critical care time:    The patient is critically ill with multiple organ system failure and requires high complexity decision making for assessment and support, frequent evaluation and titration of therapies, advanced monitoring, review of radiographic studies and interpretation of complex data.   Critical Care Time devoted to patient care services, exclusive of separately billable procedures, described in this note is 35 minutes.   Marshell Garfinkel MD [facility] See Amion for pager  If no response to pager , please call [phone] until 7pm After 7:00 pm call Elink  [phone] 01/11/2022, 10:02 AM

## 2022-01-11 NOTE — Consult Note (Signed)
Advanced Heart Failure Team Consult Note   Primary Physician: Carlena Hurl, PA-C PCP-Cardiologist:  Glenetta Hew, MD  Reason for Consultation: CHF/cardiogenic shock  HPI:    Stephanie Franco is seen today for evaluation of cardiogenic shock at the request of Dr. Debara Pickett.   Ms Deguire is a 71 y.o. with history of permanent atrial fibrillation for 20 years, DMII, HTN, Hyperlipidemia, left breast cancer (had mastectomy 20 years ago), right hip replacement, arthritis,  CAD with prior PCI OM in 5852 and systolic heart failure.    She was admitted 06/20/21 with chest pain, but also noted to have orthopnea and worsening dyspnea. HS Trop 71>58>51. Echo showed EF 25-30%, moderate RV dysfunction, moderate biatrial enlargement, at least moderate posteriorly-directed MR. LHC/RHC showed stable CAD with elevated R>L heart filling pressures and low cardiac index 1.89.  AHF team consulted. She was started on milrinone, PICC placed. Started on IV lasix and added amiodarone given frequent PVCs. Underwent further w/u of pulmonary hypertension and HF. V/Q scan was not suggestive of chronic PE.  Serologic workup negative (ANA, anti-centromere ab, anti-scl70, RF). TEE was done to better assess severity of her MR. This showed restriction of the posterior mitral leaflet and moderate eccentric mitral regurgitation (posteriorly-directed), normal LV size w/ global HK EF 35-40%, RV size normal w/ moderate systolic dysfunction, severe LAE. Suspect functional MR, possibly atrial functional MR. 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  LVEF on MRI 36%, MR moderate. She was weaned off of milrinone w/ stable co-ox and transition to PO diuretics, torsemide 40 mg daily. GDMT added. SCr stabilized ~1.9. She was discharged home, weight 188 lbs.    Seen in ED 3/23 with CP, SOB and elevated SCr. Cards saw, felt she was on the dry side. Given  gentle IVF, digoxin and amio stopped due to bradycardia/junctional rhythm, and hydralazine stopped due to fatigue.    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  Biopsy was done showing breast cancer primary. She has been started on letrozole.    Echo 5/23 showed EF 30-35%, mildly dilated RV with mild RV systolic dysfunction, PASP 67 mmHg, moderate-severe functional MR with posterior leaflet restriction and dilated IVC.  She has been off losartan, and torsemide has been decreased to 20 mg bid due to elevated creatinine (most recently 2.08).    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 TEE showed EF 30-35%, moderate RV systolic dysfunction, moderate TR, severe MR. She was seen by Dr. Ali Lowe for Mitraclip evaluation.    Patient had attempted Mitraclip procedure 8/24 but unable to advance guide catheter successfully across septum septum despite repeated balloon septostomy. She was kept in the hospital for diuresis post-procedure and developed progressive AKI with initial creatinine 2.4 increasing to 3.6 yesterday.  Yesterday, she was oliguric and in the afternoon developed somnolence and brief PEA arrest. She was intubated and transported to [facility].  She required escalating pressors with poor diuresis and was started on CVVH.  She has been getting CVVH overnight pulling 50-75 cc/hr net negative.  Currently on milrinone 0.25, NE 37, vasopressin 0.03 with NO 20 ppm. HR improved in 80s-90s on amiodarone gtt for rate control. Co-ox 74% with CVP 19.   Echo don 8/26 and reviewed: EF 25-30%, moderate RV enlargement with moderately decreased systolic function,  D-shaped septum, PASP 67, severe biatrial enlargement, severe MR, severe TR, markedly dilated IVC, moderate ASD 4 x 7 mm at atrial septostomy site.   Review of Systems:  Intubated, unable to obtain.   Home Medications Prior to Admission medications   Medication Sig Start Date End Date Taking? Authorizing Provider  acetaminophen (TYLENOL) 500 MG tablet Take 1,000 mg by mouth every 6 (six) hours as needed for mild pain.   Yes [provider]  albuterol (PROVENTIL HFA;VENTOLIN HFA) 108 (90 Base) MCG/ACT inhaler Inhale 1-2 puffs into the lungs every 6 (six) hours as needed for wheezing or shortness of breath. 05/20/15  Yes Harvel Quale, MD  allopurinol (ZYLOPRIM) 300 MG tablet TAKE 1 TABLET BY MOUTH EVERY DAY AS NEEDED Patient taking differently: Take 300 mg by mouth every evening. 11/17/21  Yes Irene Pap, PA-C  Biotin 1000 MCG tablet Take 1,000 mcg by mouth daily.   Yes [provider]  ELIQUIS 5 MG TABS tablet TAKE 1 TABLET BY MOUTH TWICE A DAY 07/11/19  Yes Leonie Man, MD  ezetimibe (ZETIA) 10 MG tablet Take 1 tablet (10 mg total) by mouth daily. 06/27/21  Yes Simmons, Brittainy M, PA-C  FARXIGA 10 MG TABS tablet TAKE 1 TABLET BY MOUTH DAILY 12/17/21  Yes Lyda Jester M, PA-C  gabapentin (NEURONTIN) 600 MG tablet TAKE 1 TABLET BY MOUTH THREE TIMES A DAY 12/10/21  Yes Tysinger, Camelia Eng, PA-C  hydrALAZINE (APRESOLINE) 25 MG tablet Take 0.5 tablets (12.5 mg total) by mouth 3 (three) times daily. 10/07/21  Yes Larey Dresser, MD  Hydrocortisone, Perianal, 1 % CREA Apply 1 application. topically 3 (three) times daily as needed (irritation). 09/18/21  Yes [provider]  isosorbide mononitrate (IMDUR) 60 MG 24 hr tablet Take 60 mg by mouth daily.   Yes [provider]  letrozole (FEMARA) 2.5 MG tablet Take 1 tablet (2.5 mg total) by mouth daily. 10/02/21  Yes Owens Shark, NP  LORazepam (ATIVAN) 1 MG tablet Take 1 mg by mouth 2 (two) times daily as needed for anxiety. 07/07/16  Yes [provider]  metolazone (ZAROXOLYN) 2.5 MG tablet Take 1 tablet (2.5 mg total) by mouth once a week. On Mondays. With an extra  2 tablets of potassium 12/29/21  Yes [place], Maricela Bo, FNP  Multiple Vitamins-Minerals (MULTIVITAMIN WITH MINERALS) tablet Take 1 tablet by mouth daily.   Yes [provider]  Naphazoline-Glycerin (CLEAR EYES MAX REDNESS RELIEF OP) Place 2 drops into both eyes daily as needed (redness).   Yes [provider]  nystatin (MYCOSTATIN/NYSTOP) powder Apply 1 Application topically 2 (two) times daily as needed (rash).   Yes [provider]  Oxycodone HCl 10 MG TABS Take 0.5 tablets (5 mg total) by mouth 3 (three) times daily as needed. For pain 12/24/21  Yes Owens Shark, NP  pantoprazole (PROTONIX) 40 MG tablet TAKE 1 TABLET BY MOUTH EVERY DAY 04/02/21  Yes Leonie Man, MD  potassium chloride SA (KLOR-CON M) 20 MEQ tablet Take 3 tablets by mouth in the morning and evening Patient taking differently: Take 60-80 mEq by mouth See admin instructions. Take 60 meq twice daily except on Mondays take 80 meq twice daily (with the metolazone) 12/16/21  Yes [place], Maricela Bo, FNP  rosuvastatin (CRESTOR) 40 MG tablet Take 40 mg by mouth daily.   Yes [provider]  senna-docusate (SENOKOT-S) 8.6-50 MG tablet Take 1 tablet by mouth 2 (two) times daily. 09/26/21  Yes Posey Pronto,  Monik, MD  spironolactone (ALDACTONE) 25 MG tablet Take 1 tablet (25 mg total) by mouth daily. 09/26/21  Yes Lajean Manes, MD  torsemide (DEMADEX) 20 MG tablet Take 60 mg by mouth 2 (two) times daily.   Yes [provider]  TOUJEO SOLOSTAR 300 UNIT/ML Solostar Pen Inject 30 Units into the skin 2 (two) times daily. 07/28/21  Yes [provider]  triamcinolone ointment (KENALOG) 0.5 % Apply 1 Application topically 2 (two) times daily as needed (rash/itching).   Yes [provider]  fexofenadine (ALLEGRA) 180 MG tablet Take 180 mg by mouth daily as needed for allergies.  04/30/15   [provider]  glucose blood test strip 1 each by Other route as needed for other. Use as  instructed    [provider]  nitroGLYCERIN (NITROSTAT) 0.4 MG SL tablet DISSOLVE 1 TABLET UNDER THE TONGUE EVERY 5 MINUTES AS NEEDED FOR CHEST PAIN. 07/25/21   Leonie Man, MD  ondansetron (ZOFRAN) 4 MG tablet Take 1 tablet (4 mg total) by mouth every 6 (six) hours as needed for nausea. 09/26/21   Lajean Manes, MD    Past Medical History: 1. Type 2 diabetes 2. Breast cancer: Mastectomy 2008.  Bony metastases found 5/23, biopsy showed breast primary.  3. CKD stage 3 4. Atrial fibrillation: Permanent.  5. CAD: DES to LCx in 2006.  - LHC (2/23): 60% proximal RCA, 90% distal LCx.  Medical management.  6. Gout 7. Hyperlipidemia 8. HTN 9. Chronic systolic CHF: Primarily nonischemic cardiomyopathy.  - Echo (2019): EF 40-45% RV moderately dilated.  - Echo (2020): EF 50-55%  LA and RA moderately dilated. - Echo (2/23): EF 25%,  RV moderately reduced systolic function, LA severely dilated, RA moderately dilated, MV restricted posterior leaflet  with moderate MR.  - cMRI (2/23): Findings suggest mixed ischemic/nonischemic cardiomyopathy (?myocarditis with prior coronary disease).  LVEF on MRI 36%. MR moderate.  - RHC (2/23): RA 21, PA 80/30, LVEDP 14, CI 1.9, PVR 11 WU. - Echo (5/23): EF 30-35%, mildly dilated RV with mild RV systolic dysfunction, PASP 67 mmHg, moderate-severe functional MR with posterior leaflet restriction and dilated IVC.  10. Mitral regurgitation: Suspect functional MR, possibly atrial functional MR.  - Moderate-severe MR with restricted posterior leaflet on 5/23 echo.  - TEE (6/23) showed severe MR with restricted posterior leaflet in setting of dilation of the LA and LV. - Failed Mitraclip 8/23 11. Pulmonary hypertension:  - RHC (2/23): RA 21, PA 80/30, LVEDP 14, CI 1.9, PVR 11 WU. - VQ scan (2/23): no evidence for chronic PE - Sleep study negative - Serologic workup negative (ANA, anti-centromere ab, anti-scl70, RF) - CT chest (5/23) did not show ILD.  - RHC  (6/23): RA 16, PA 86/41, PCWP 27, CI 2.36, PVR 6.5 WU 12. PVCs   Past Surgical History: Past Surgical History:  Procedure Laterality Date   Cardiac MRI  06/24/2021   Severe biventricular failure: LVEF 36%-diffuse HK. RV EF 32%. Severe BiA Enlargement. Mod MR. LGE in septum - non-coronary pattern -? Myocarditis. Anterior wall LGE is subendocardial - ? Prior MI.   COLONOSCOPY     CORONARY ANGIOPLASTY WITH STENT PLACEMENT  2006   stent to circumflex   DILATION AND CURETTAGE OF UTERUS     LEFT HEART CATHETERIZATION WITH CORONARY ANGIOGRAM N/A 07/13/2011   Procedure: LEFT HEART CATHETERIZATION WITH CORONARY ANGIOGRAM;  Surgeon: Leonie Man, MD;  Location: [facility] CATH LAB;  Service: Cardiovascular;  normal EF; 29m 50-60% lesion in  RCA; patent stent in circumflex form 2006 with ~40% in-stent re-stenosis   MASTECTOMY  2001   left   MITRAL VALVE REPAIR N/A 12/18/2021   Procedure: MITRAL VALVE REPAIR;  Surgeon: Early Osmond, MD;  Location: [place] CV LAB;  Service: Cardiovascular;  Laterality: N/A;   NM MYOVIEW LTD  7/'16; 6/'21   LOW RISK. No ischemia or infarction. Not gated 2/2  A. fib - unable to assess EF   RIGHT HEART CATH N/A 10/22/2021   Procedure: RIGHT HEART CATH;  Surgeon: Larey Dresser, MD;  Location: [place] CV LAB;  Service: Cardiovascular;  Laterality: N/A;   RIGHT/LEFT HEART CATH AND CORONARY ANGIOGRAPHY N/A 06/23/2021   Procedure: RIGHT/LEFT HEART CATH AND CORONARY ANGIOGRAPHY;  Surgeon: Lorretta Harp, MD;  Location: [place] CV LAB;  Service: Cardiovascular; p-mRCA 60%.  Distal LCx 90%. mLAD stent patent.  RAP 22 RVP 70/1 mmHg with PAP 80/30 mmHg. LVEDP 14 mmHg-unable to assess PCWP.   TEE WITHOUT CARDIOVERSION N/A 06/25/2021   Procedure: TRANSESOPHAGEAL ECHOCARDIOGRAM (TEE);  Surgeon: Larey Dresser, MD;  Location: [facility] ENDOSCOPY; EF 35 to 40% with global HK.  Mildly reduced RV function.  Severely dilated left atrium with no LAA thrombus.  ?  Mild RA dilation.   Posterior MVL restriction w/ moderate eccentric/posterior directed MR.  No PV reversal. ~ functional MR 2/2 Afib.  Mild dilation of the AscAo (~ 40 mm)   TEE WITHOUT CARDIOVERSION N/A 10/22/2021   Procedure: TRANSESOPHAGEAL ECHOCARDIOGRAM (TEE);  Surgeon: Larey Dresser, MD;  Location: [facility] ENDOSCOPY;  Service: Cardiovascular;  Laterality: N/A;   TEE WITHOUT CARDIOVERSION N/A 01/11/2022   Procedure: TRANSESOPHAGEAL ECHOCARDIOGRAM (TEE);  Surgeon: Early Osmond, MD;  Location: [place] CV LAB;  Service: Cardiovascular;  Laterality: N/A;   TOTAL HIP ARTHROPLASTY Right 01/19/2018   Procedure: RIGHT TOTAL HIP ARTHROPLASTY ANTERIOR APPROACH;  Surgeon: Rod Can, MD;  Location: [facility] ORS;  Service: Orthopedics;  Laterality: Right;  Needs RNFA   TRANSTHORACIC ECHOCARDIOGRAM  6/'15; 9/'16   a) In setting of Urosepsis: EF ~25 %, global HK (poor quality study);; b) LV EF 40-45%, Mild Anteroseptal HK. Mod-Severe  RA dilation with elevated PA Pressures (~~peak 63 mmHg).. Mild LA dilation   TRANSTHORACIC ECHOCARDIOGRAM  12/2017; 04/15/2018   a) Improved LVEF to 45 to 50%.  Pulmonary HTN ~PAP ~ 60 mmHg. Severe LA dilation& mild /rv dilation.; b) Mod LVH. EF 40-45% (back to prior EF). Unable to assess DF (Afib). Severe R&L Atrial enlargement.  Moderate Post-Lateral directed MR with Mod RV dilation.  PAP ~66 mmHg.;    TRANSTHORACIC ECHOCARDIOGRAM  01/2019   relook Echo: EF back up to 50 to 55%.  Indeterminate diastolic parameters.  Moderately dilated left atrium and right atrium.  Moderate MR.  Mild to moderate TR.   TRANSTHORACIC ECHOCARDIOGRAM  06/21/2021   EF 25 to 30%.  Severely reduced function.  Paradoxical septal motion consistent with LBBB.  Unable to assess diastolic pressures.  Severe biatrial dilation.  Mildly elevated PA P estimated 43.5 mmHg-with RAP estimated at 8 mmHg..  Moderate posteriorly directed MR.    Family History: Family History  Problem Relation Age of Onset   Coronary artery  disease Mother    Hypertension Mother    Heart disease Father    Atrial fibrillation Son    Lung cancer Sister    Heart disease Brother    Heart disease Brother    Healthy Son    Thyroid disease Daughter  Healthy Daughter    Healthy Daughter     Social History: Social History   Socioeconomic History   Marital status: Married    Spouse name: Not on file   Number of children: 4   Years of education: Not on file   Highest education level: Not on file  Occupational History   Not on file  Tobacco Use   Smoking status: Former    Packs/day: 0.50    Years: 6.00    Total pack years: 3.00    Types: Cigarettes    Quit date: 05/18/1992    Years since quitting: 29.6   Smokeless tobacco: Never   Tobacco comments:    Former smoker 08/15/21  Vaping Use   Vaping Use: Never used  Substance and Sexual Activity   Alcohol use: No    Comment: 07/13/11 "have drank occasionally; not now"   Drug use: No   Sexual activity: Not Currently    Birth control/protection: Surgical  Other Topics Concern   Not on file  Social History Narrative   She is a married mother of 64, grandmother 2. Usually accompanied by her husband. She does not work. She had been working on her exercise, but is no longer as active. Does not drink and does not smoke   Social Determinants of Radio broadcast assistant Strain: Not on file  Food Insecurity: Not on file  Transportation Needs: Not on file  Physical Activity: Not on file  Stress: Not on file  Social Connections: Not on file    Allergies:  No Known Allergies  Objective:    Vital Signs:   Temp:  [93 F (33.9 C)-98.5 F (36.9 C)] 97.7 F (36.5 C) (08/27 0700) Pulse Rate:  [56-110] 90 (08/27 0700) Resp:  [9-26] 19 (08/27 0700) BP: (72-134)/(40-85) 101/53 (08/27 0342) SpO2:  [92 %-100 %] 100 % (08/27 0700) FiO2 (%):  [50 %-100 %] 50 % (08/27 0400) Weight:  [78.9 kg] 78.9 kg (08/27 0500) Last BM Date : 01/07/22  Weight change: Filed Weights    12/24/2021 0902 01/11/22 0500  Weight: 81.2 kg 78.9 kg    Intake/Output:   Intake/Output Summary (Last 24 hours) at 01/11/2022 0805 Last data filed at 01/11/2022 0700 Gross per 24 hour  Intake 1574.44 ml  Output 2467 ml  Net -892.56 ml      Physical Exam    General:  Intubated, awake.  HEENT: normal Neck: supple. JVP 16+. Carotids 2+ bilat; no bruits. No lymphadenopathy or thyromegaly appreciated. Cor: PMI nondisplaced. Irregular rate & rhythm. 3/6 HSM apex.  Lungs: Crackles at bases.  Abdomen: soft, nontender, nondistended. No hepatosplenomegaly. No bruits or masses. Good bowel sounds. Extremities: no cyanosis, clubbing, rash.  Trace ankle edema.  Neuro: Follows commands.   Telemetry   Atrial fibrillation 80s-90s (personally reviewed)   Labs   Basic Metabolic Panel: Recent Labs  Lab 01/05/22 1040 01/09/22 0034 01/10/22 0106 01/10/22 1744 01/11/22 0357  NA 135 137 135 131* 135  K 3.6 3.8 4.9 4.8 4.2  CL 93* 106 99  --  99  CO2 '29 24 23  '$ --  23  GLUCOSE 108* 153* 131*  --  133*  BUN 45* 44* 52*  --  39*  CREATININE 2.44* 2.47* 3.60*  --  2.88*  CALCIUM 9.6 8.4* 8.6*  --  8.3*  MG  --   --   --   --  1.9  PHOS  --   --   --   --  3.9    Liver Function Tests: Recent Labs  Lab 01/05/22 1040 01/11/22 0357  AST 30  --   ALT 25  --   ALKPHOS 75  --   BILITOT 0.8  --   PROT 7.6  --   ALBUMIN 3.4* 2.9*   No results for input(s): "LIPASE", "AMYLASE" in the last 168 hours. No results for input(s): "AMMONIA" in the last 168 hours.  CBC: Recent Labs  Lab 01/05/22 1040 01/15/2022 2049 01/10/22 0106 01/10/22 1719 01/10/22 1744 01/11/22 0357  WBC 8.3 10.0 8.2 10.7*  --  13.5*  HGB 10.3* 9.2* 8.5* 8.5* 9.5* 8.8*  HCT 34.3* 29.5* 28.4* 28.4* 28.0* 28.5*  MCV 87.1 85.3 86.6 86.9  --  84.6  PLT 233 202 181 210  --  228    Cardiac Enzymes: No results for input(s): "CKTOTAL", "CKMB", "CKMBINDEX", "TROPONINI" in the last 168 hours.  BNP: BNP (last 3  results) Recent Labs    10/07/21 1513 11/12/21 1442 12/01/21 1531  BNP 71.8 91.4 104.2*    ProBNP (last 3 results) No results for input(s): "PROBNP" in the last 8760 hours.   CBG: Recent Labs  Lab 01/10/22 1742 01/10/22 2038 01/10/22 2342 01/11/22 0341 01/11/22 0729  GLUCAP 218* 134* 147* 122* 81    Coagulation Studies: No results for input(s): "LABPROT", "INR" in the last 72 hours.   Imaging   DG CHEST PORT 1 VIEW  Result Date: 01/10/2022 CLINICAL DATA:  Status post hemodialysis catheter insertion. EXAM: PORTABLE CHEST 1 VIEW COMPARISON:  Earlier today. FINDINGS: Interval right jugular catheter with its tip in the superior vena cava near the superior cavoatrial junction. Stable enlarged cardiac silhouette and calcified AP window and subcarinal lymph nodes. The pulmonary vasculature remains mildly prominent. Clear lungs. No pneumothorax. No acute bony abnormality. The endotracheal tube tip remains 1.8 cm above the carina. IMPRESSION: 1. Satisfactory position of the right jugular catheter without pneumothorax. 2. Stable cardiomegaly and mild pulmonary vascular congestion. 3. The endotracheal tube tip remains 1.8 cm above the carina. Again, this could be retracted 2 cm for better positioning. Electronically Signed   By: Claudie Revering M.D.   On: 01/10/2022 17:48   ECHOCARDIOGRAM LIMITED  Result Date: 01/10/2022    ECHOCARDIOGRAM LIMITED REPORT   Patient Name:   TONGELA ENCINAS Date of Exam: 01/10/2022 Medical Rec #:  [id number]        Height:       63.0 in Accession #:    [id number]       Weight:       179.0 lb Date of Birth:  1950-06-07        BSA:          1.845 m Patient Age:    51 years         BP:           90/74 mmHg Patient Gender: F                HR:           90 bpm. Exam Location:  Inpatient Procedure: Limited Echo, Limited Color Doppler and Cardiac Doppler STAT ECHO Indications:    cardiac arrest  History:        Patient has prior history of Echocardiogram examinations,  most                 recent 12/30/2021. Cardiomyopathy, CAD, Pulmonary HTN,  Arrythmias:Atrial Fibrillation; Risk Factors:Hypertension,                 Dyslipidemia and Sleep Apnea.  Sonographer:    [place] Referring Phys: [id number] [place]  1. Left ventricular ejection fraction, by estimation, is 25 to 30%. The left ventricle has severely decreased function. The left ventricle demonstrates global hypokinesis. There is the interventricular septum is flattened in systole and diastole, consistent with right ventricular pressure and volume overload.  2. Right ventricular systolic function is moderately reduced. The right ventricular size is moderately enlarged. There is severely elevated pulmonary artery systolic pressure. The estimated right ventricular systolic pressure is 66.2 mmHg.  3. Left atrial size was severely dilated.  4. Right atrial size was severely dilated.  5. A small pericardial effusion is present. The pericardial effusion is localized near the right ventricle.  6. Severe mitral valve regurgitation.  7. Tricuspid valve regurgitation is severe.  8. The inferior vena cava is dilated in size with <50% respiratory variability, suggesting right atrial pressure of 15 mmHg.  9. Evidence of atrial level shunting detected by color flow Doppler. There is a moderately sized secundum atrial septal defect with predominantly left to right shunting across the atrial septum. Comparison(s): Changes from prior study are noted. 01/03/2022 (TEE): LVEF 25-30%, ASD 4x7 mm, severe biatrial enlargement, severe MR, moderate to severe TR. Conclusion(s)/Recommendation(s): Critical findings reported to Dr. Tamala Julian and acknowledged at 4:15 pm. FINDINGS  Left Ventricle: Left ventricular ejection fraction, by estimation, is 25 to 30%. The left ventricle has severely decreased function. The left ventricle demonstrates global hypokinesis. The interventricular septum is flattened in systole  and diastole, consistent with right ventricular pressure and volume overload. Right Ventricle: The right ventricular size is moderately enlarged. No increase in right ventricular wall thickness. Right ventricular systolic function is moderately reduced. There is severely elevated pulmonary artery systolic pressure. The tricuspid regurgitant velocity is 3.61 m/s, and with an assumed right atrial pressure of 15 mmHg, the estimated right ventricular systolic pressure is 94.7 mmHg. Left Atrium: Left atrial size was severely dilated. Right Atrium: Right atrial size was severely dilated. Pericardium: A small pericardial effusion is present. The pericardial effusion is localized near the right ventricle. Mitral Valve: Severe mitral valve regurgitation. Tricuspid Valve: Tricuspid valve regurgitation is severe. Venous: The inferior vena cava is dilated in size with less than 50% respiratory variability, suggesting right atrial pressure of 15 mmHg. IAS/Shunts: Evidence of atrial level shunting detected by color flow Doppler. There is a moderately sized secundum atrial septal defect with predominantly left to right shunting across the atrial septum. TRICUSPID VALVE TR Peak grad:   52.1 mmHg TR Vmax:        361.00 cm/s Lyman Bishop MD Electronically signed by Lyman Bishop MD Signature Date/Time: 01/10/2022/4:30:12 PM    Final    DG CHEST PORT 1 VIEW  Result Date: 01/10/2022 CLINICAL DATA:  OG tube placement EXAM: PORTABLE CHEST 1 VIEW COMPARISON:  Same day radiograph FINDINGS: Interval placement of endotracheal tube terminating approximately 1.8 cm above the carina. Interval placement of enteric tube which courses below the diaphragm with distal tip coiled in the gastric fundus. Left IJ central venous catheter is stable in positioning. Multiple overlying cardiac leads. Stable cardiomegaly. No new airspace consolidation. No pneumothorax. IMPRESSION: 1. Interval placement of endotracheal tube with distal tip 1.8 cm above the  carina. Consider 2 cm of retraction for more optimal positioning. 2. Satisfactory positioning of OG tube. 3. Otherwise, stable chest.  Electronically Signed   By: Davina Poke D.O.   On: 01/10/2022 15:59   DG CHEST PORT 1 VIEW  Result Date: 01/10/2022 CLINICAL DATA:  Central line placement EXAM: PORTABLE CHEST 1 VIEW COMPARISON:  01/05/2022 FINDINGS: Interval placement of left IJ approach central venous catheter with distal tip projecting near the proximal right atrium. Heart is markedly enlarged. Aortic atherosclerosis. No new airspace consolidation. No pneumothorax. Lytic lesion in the distal right clavicle again seen. IMPRESSION: Interval placement of left IJ approach central venous catheter with distal tip projecting near the proximal right atrium. No pneumothorax. Electronically Signed   By: Davina Poke D.O.   On: 01/10/2022 14:21     Medications:     Current Medications:  allopurinol  50 mg Oral QPM   Chlorhexidine Gluconate Cloth  6 each Topical Daily   docusate  100 mg Per Tube BID   ezetimibe  10 mg Oral Daily   insulin aspart  0-15 Units Subcutaneous Q4H   insulin glargine-yfgn  30 Units Subcutaneous QHS   letrozole  2.5 mg Oral Daily   multivitamin with minerals  1 tablet Oral Daily   mouth rinse  15 mL Mouth Rinse Q2H   pantoprazole (PROTONIX) IV  40 mg Intravenous QHS   polyethylene glycol  17 g Per Tube Daily   rosuvastatin  40 mg Oral Daily   senna-docusate  1 tablet Oral BID   sodium chloride flush  10-40 mL Intracatheter Q12H   sodium chloride flush  3 mL Intravenous Q12H    Infusions:   prismasol BGK 4/2.5 400 mL/hr at 01/10/22 2028    prismasol BGK 4/2.5 400 mL/hr at 01/10/22 2027   sodium chloride Stopped (01/10/22 2245)   amiodarone 30 mg/hr (01/11/22 0700)   DOBUTamine     heparin 1,150 Units/hr (01/11/22 0700)   norepinephrine (LEVOPHED) Adult infusion 37 mcg/min (01/11/22 0700)   prismasol BGK 4/2.5 1,500 mL/hr at 01/11/22 0201   propofol  (DIPRIVAN) infusion 15 mcg/kg/min (01/11/22 0700)   vasopressin 0.03 Units/min (01/11/22 0400)     Assessment/Plan   1. Acute on chronic systolic CHF => cardiogenic shock: Mixed ischemic/nonischemic cardiomyopathy. RHC in 2/23 showed elevated R>L heart filling pressures with pulmonary arterial hypertension and low CI 1.89. Cardiac MRI w/ myocardial LGE in septum is in a noncoronary pattern, ? myocarditis, anterior wall LGE is subendocardial and may be due to prior MI => ?mixed ischemic/nonischemic cardiomyopathy. TEE (6/23) with EF 30-35%, moderate RV dysfunction, severe MR.  She had attempted Mitraclip placment this admission with failure, now with cardiogenic shock, AKI, and marked volume overload.  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 Intubated on pressors with CVVH running.  Today, milrinone 0.25, NE 37, vasopressin 0.03 with Lasix gtt still running at 10 mg/hr and NO 20 ppm. Co-ox 74%, CVP 19.  - Stop milrinone, start dobutamine 5.  - Stop Lasix gtt.  - Continue CVVH, would like to aim for net UF up to 100 cc/hr.  - Wean NE/vasopressin as able.  - Prognosis is guarded.  She has metastatic breast cancer, not candidate for LVAD.  She has severe MR and TR with biventricular dysfunction (ASD also likely plays at least a small role in decompensation).  Failed Mitraclip earlier this admission.  Think our only possible option would be to get her stable enough to  re-attempt Mitraclip though this may be a long shot.  2. Atrial fibrillation: This is permanent, x years.   - Rate controlled on amiodarone gtt.  - Heparin gtt for anticoagulation.  3. PVCs: Frequent during 2/23 admission but was also on milrinone. ? Contributing to cardiomyopathy.   4. Mitral regurgitation:  TEE (6/23) with severe MR with  restricted posterior leaflet in setting of dilation of the LA and LV. mTEER attempted on 8/24 but failed.  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  As above, will try to stabilize her enough to consider repeat Mitraclip though not sure how feasible this will be.  5. AKI on CKD stage 3: AKI in setting of cardiogenic shock, now on CVVH.  - As above, aim for net negative UF up to 100 cc/hr today.  6. Pulmonary hypertension: On 2/23 RHC, PCWP not obtained, so with using LVEDP, PVR is 11 WU suggesting severe pulmonary arterial hypertension.  Etiology unclear.  Sleep study negative.  V/Q scan not suggestive of chronic PE. CT chest did not show interstitial lung disease. Serologic workup negative (ANA, anti-centromere ab, anti-scl70, RF).  Repeat RHC (6/23) showed PCWP 27. 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 7. CAD: Cath 2/23 with prox RCA to Mid RCA lesion 60% stenosis, distal LCx lesion 90% stenosis (similar to the past).  Medical management. Not clear that CAD can explain her cardiomyopathy.  - Continue statin/Zetia  - No ASA given anticoagulation.   8. Acute hypoxemic respiratory failure: Intubated in setting of pulmonary edema.  - CCM following.   Length of Stay: 3  Loralie Champagne, MD  01/11/2022, 8:05 AM  Advanced Heart Failure Team Pager [phone] (M-F; 7a - 5p)  Please contact [facility] for night-coverage after hours (4p -7a ) and weekends on [email or web address]

## 2022-01-12 ENCOUNTER — Inpatient Hospital Stay (HOSPITAL_COMMUNITY): Payer: Medicare Other

## 2022-01-12 ENCOUNTER — Encounter (HOSPITAL_COMMUNITY): Admission: RE | Disposition: E | Payer: Self-pay | Source: Home / Self Care | Attending: Cardiology

## 2022-01-12 DIAGNOSIS — R570 Cardiogenic shock: Secondary | ICD-10-CM

## 2022-01-12 DIAGNOSIS — I340 Nonrheumatic mitral (valve) insufficiency: Secondary | ICD-10-CM | POA: Diagnosis not present

## 2022-01-12 DIAGNOSIS — I4821 Permanent atrial fibrillation: Secondary | ICD-10-CM | POA: Diagnosis not present

## 2022-01-12 DIAGNOSIS — N179 Acute kidney failure, unspecified: Secondary | ICD-10-CM

## 2022-01-12 DIAGNOSIS — I5043 Acute on chronic combined systolic (congestive) and diastolic (congestive) heart failure: Secondary | ICD-10-CM | POA: Diagnosis not present

## 2022-01-12 DIAGNOSIS — J9601 Acute respiratory failure with hypoxia: Secondary | ICD-10-CM

## 2022-01-12 HISTORY — PX: RIGHT HEART CATH: CATH118263

## 2022-01-12 HISTORY — PX: IABP INSERTION: CATH118242

## 2022-01-12 LAB — COOXEMETRY PANEL
Carboxyhemoglobin: 1.4 % (ref 0.5–1.5)
Carboxyhemoglobin: 1.6 % — ABNORMAL HIGH (ref 0.5–1.5)
Carboxyhemoglobin: 1.2 % (ref 0.5–1.5)
Methemoglobin: 0.9 % (ref 0.0–1.5)
Methemoglobin: 0.9 % (ref 0.0–1.5)
Methemoglobin: 2.1 % — ABNORMAL HIGH (ref 0.0–1.5)
O2 Saturation: 81.7 %
O2 Saturation: 80.4 %
O2 Saturation: 82.4 %
Total hemoglobin: 8.2 g/dL — ABNORMAL LOW (ref 12.0–16.0)
Total hemoglobin: 9.8 g/dL — ABNORMAL LOW (ref 12.0–16.0)
Total hemoglobin: <6.9 g/dL — CL (ref 12.0–16.0)

## 2022-01-12 LAB — CBC
HCT: 27.8 % — ABNORMAL LOW (ref 36.0–46.0)
HCT: 25.9 % — ABNORMAL LOW (ref 36.0–46.0)
Hemoglobin: 8.5 g/dL — ABNORMAL LOW (ref 12.0–15.0)
Hemoglobin: 7.9 g/dL — ABNORMAL LOW (ref 12.0–15.0)
MCH: 26.6 pg (ref 26.0–34.0)
MCH: 26.3 pg (ref 26.0–34.0)
MCHC: 30.6 g/dL (ref 30.0–36.0)
MCHC: 30.5 g/dL (ref 30.0–36.0)
MCV: 87.1 fL (ref 80.0–100.0)
MCV: 86.3 fL (ref 80.0–100.0)
Platelets: 219 10*3/uL (ref 150–400)
Platelets: 186 10*3/uL (ref 150–400)
RBC: 3.19 MIL/uL — ABNORMAL LOW (ref 3.87–5.11)
RBC: 3 MIL/uL — ABNORMAL LOW (ref 3.87–5.11)
RDW: 19.6 % — ABNORMAL HIGH (ref 11.5–15.5)
RDW: 19.7 % — ABNORMAL HIGH (ref 11.5–15.5)
WBC: 13.2 10*3/uL — ABNORMAL HIGH (ref 4.0–10.5)
WBC: 12.1 10*3/uL — ABNORMAL HIGH (ref 4.0–10.5)
nRBC: 1.4 % — ABNORMAL HIGH (ref 0.0–0.2)
nRBC: 1.6 % — ABNORMAL HIGH (ref 0.0–0.2)

## 2022-01-12 LAB — POCT I-STAT EG7
Acid-Base Excess: 0 mmol/L (ref 0.0–2.0)
Acid-Base Excess: 1 mmol/L (ref 0.0–2.0)
Acid-Base Excess: 1 mmol/L (ref 0.0–2.0)
Bicarbonate: 25.3 mmol/L (ref 20.0–28.0)
Bicarbonate: 26 mmol/L (ref 20.0–28.0)
Bicarbonate: 26.2 mmol/L (ref 20.0–28.0)
Calcium, Ion: 1.04 mmol/L — ABNORMAL LOW (ref 1.15–1.40)
Calcium, Ion: 1.07 mmol/L — ABNORMAL LOW (ref 1.15–1.40)
Calcium, Ion: 1.03 mmol/L — ABNORMAL LOW (ref 1.15–1.40)
HCT: 28 % — ABNORMAL LOW (ref 36.0–46.0)
HCT: 28 % — ABNORMAL LOW (ref 36.0–46.0)
HCT: 28 % — ABNORMAL LOW (ref 36.0–46.0)
Hemoglobin: 9.5 g/dL — ABNORMAL LOW (ref 12.0–15.0)
Hemoglobin: 9.5 g/dL — ABNORMAL LOW (ref 12.0–15.0)
Hemoglobin: 9.5 g/dL — ABNORMAL LOW (ref 12.0–15.0)
O2 Saturation: 84 %
O2 Saturation: 85 %
O2 Saturation: 73 %
Potassium: 4.5 mmol/L (ref 3.5–5.1)
Potassium: 4.5 mmol/L (ref 3.5–5.1)
Potassium: 4.5 mmol/L (ref 3.5–5.1)
Sodium: 136 mmol/L (ref 135–145)
Sodium: 136 mmol/L (ref 135–145)
Sodium: 136 mmol/L (ref 135–145)
TCO2: 27 mmol/L (ref 22–32)
TCO2: 27 mmol/L (ref 22–32)
TCO2: 28 mmol/L (ref 22–32)
pCO2, Ven: 41 mmHg — ABNORMAL LOW (ref 44–60)
pCO2, Ven: 42 mmHg — ABNORMAL LOW (ref 44–60)
pCO2, Ven: 45.3 mmHg (ref 44–60)
pH, Ven: 7.399 (ref 7.25–7.43)
pH, Ven: 7.399 (ref 7.25–7.43)
pH, Ven: 7.37 (ref 7.25–7.43)
pO2, Ven: 49 mmHg — ABNORMAL HIGH (ref 32–45)
pO2, Ven: 50 mmHg — ABNORMAL HIGH (ref 32–45)
pO2, Ven: 40 mmHg (ref 32–45)

## 2022-01-12 LAB — RENAL FUNCTION PANEL
Albumin: 2.6 g/dL — ABNORMAL LOW (ref 3.5–5.0)
Anion gap: 11 (ref 5–15)
BUN: 18 mg/dL (ref 8–23)
CO2: 22 mmol/L (ref 22–32)
Calcium: 8.4 mg/dL — ABNORMAL LOW (ref 8.9–10.3)
Chloride: 102 mmol/L (ref 98–111)
Creatinine, Ser: 1.75 mg/dL — ABNORMAL HIGH (ref 0.44–1.00)
GFR, Estimated: 31 mL/min — ABNORMAL LOW (ref >60)
Glucose, Bld: 142 mg/dL — ABNORMAL HIGH (ref 70–99)
Phosphorus: 3.9 mg/dL (ref 2.5–4.6)
Potassium: 4.3 mmol/L (ref 3.5–5.1)
Sodium: 135 mmol/L (ref 135–145)

## 2022-01-12 LAB — COMPREHENSIVE METABOLIC PANEL
ALT: 86 U/L — ABNORMAL HIGH (ref 0–44)
AST: 154 U/L — ABNORMAL HIGH (ref 15–41)
Albumin: 2.8 g/dL — ABNORMAL LOW (ref 3.5–5.0)
Alkaline Phosphatase: 74 U/L (ref 38–126)
Anion gap: 8 (ref 5–15)
BUN: 18 mg/dL (ref 8–23)
CO2: 24 mmol/L (ref 22–32)
Calcium: 8 mg/dL — ABNORMAL LOW (ref 8.9–10.3)
Chloride: 104 mmol/L (ref 98–111)
Creatinine, Ser: 1.54 mg/dL — ABNORMAL HIGH (ref 0.44–1.00)
GFR, Estimated: 36 mL/min — ABNORMAL LOW (ref >60)
Glucose, Bld: 109 mg/dL — ABNORMAL HIGH (ref 70–99)
Potassium: 4.3 mmol/L (ref 3.5–5.1)
Sodium: 136 mmol/L (ref 135–145)
Total Bilirubin: 1 mg/dL (ref 0.3–1.2)
Total Protein: 6.8 g/dL (ref 6.5–8.1)

## 2022-01-12 LAB — PHOSPHORUS
Phosphorus: 2.9 mg/dL (ref 2.5–4.6)
Phosphorus: 3.9 mg/dL (ref 2.5–4.6)

## 2022-01-12 LAB — HEPARIN LEVEL (UNFRACTIONATED): Heparin Unfractionated: >1.1 IU/mL — ABNORMAL HIGH (ref 0.30–0.70)

## 2022-01-12 LAB — APTT: aPTT: 124 seconds — ABNORMAL HIGH (ref 24–36)

## 2022-01-12 LAB — GLUCOSE, CAPILLARY
Glucose-Capillary: 98 mg/dL (ref 70–99)
Glucose-Capillary: 90 mg/dL (ref 70–99)
Glucose-Capillary: 103 mg/dL — ABNORMAL HIGH (ref 70–99)

## 2022-01-12 LAB — MAGNESIUM
Magnesium: 2.5 mg/dL — ABNORMAL HIGH (ref 1.7–2.4)
Magnesium: 2.6 mg/dL — ABNORMAL HIGH (ref 1.7–2.4)

## 2022-01-12 SURGERY — IABP INSERTION
Anesthesia: LOCAL

## 2022-01-12 MED ORDER — PROSOURCE TF20 ENFIT COMPATIBL EN LIQD
60.0000 mL | Freq: Every day | ENTERAL | Status: DC
  Administered 2022-01-12 – 2022-01-15 (×4): 60 mL
  Filled 2022-01-12 (×4): qty 60

## 2022-01-12 MED ORDER — SODIUM CHLORIDE 0.9% FLUSH: 3.0000 mL | Freq: Two times a day (BID) | INTRAVENOUS | Status: DC

## 2022-01-12 MED ORDER — LIDOCAINE HCL (PF) 1 % IJ SOLN
INTRAMUSCULAR | Status: DC | PRN
  Administered 2022-01-12: 15 mL

## 2022-01-12 MED ORDER — SODIUM CHLORIDE 0.9 % IV SOLN
250.0000 mL | INTRAVENOUS | Status: DC | PRN
Start: 2022-01-12 — End: 2022-01-12

## 2022-01-12 MED ORDER — ATORVASTATIN CALCIUM 80 MG PO TABS
80.0000 mg | ORAL_TABLET | Freq: Every day | ORAL | Status: DC
  Administered 2022-01-12 – 2022-01-17 (×6): 80 mg
  Filled 2022-01-12 (×6): qty 1

## 2022-01-12 MED ORDER — RENA-VITE PO TABS
1.0000 | ORAL_TABLET | Freq: Every day | ORAL | Status: DC
  Administered 2022-01-12 – 2022-01-17 (×6): 1 via ORAL
  Filled 2022-01-12 (×6): qty 1

## 2022-01-12 MED ORDER — FENTANYL 2500MCG IN NS 250ML (10MCG/ML) PREMIX INFUSION
0.0000 ug/h | INTRAVENOUS | Status: DC
  Administered 2022-01-13: 25 ug/h via INTRAVENOUS
  Administered 2022-01-14: 100 ug/h via INTRAVENOUS
  Filled 2022-01-12 (×3): qty 250

## 2022-01-12 MED ORDER — HEPARIN (PORCINE) IN NACL 1000-0.9 UT/500ML-% IV SOLN
INTRAVENOUS | Status: DC | PRN
  Administered 2022-01-12: 500 mL

## 2022-01-12 MED ORDER — LIDOCAINE HCL (PF) 1 % IJ SOLN
INTRAMUSCULAR | Status: AC
  Filled 2022-01-12: qty 30

## 2022-01-12 MED ORDER — VITAL 1.5 CAL PO LIQD
1000.0000 mL | ORAL | Status: DC
  Administered 2022-01-12: 1000 mL

## 2022-01-12 MED ORDER — HEPARIN (PORCINE) IN NACL 1000-0.9 UT/500ML-% IV SOLN
INTRAVENOUS | Status: AC
  Filled 2022-01-12: qty 1000

## 2022-01-12 MED ORDER — PROPOFOL BOLUS VIA INFUSION
INTRAVENOUS | Status: DC | PRN
  Administered 2022-01-12: 10 mg via INTRAVENOUS

## 2022-01-12 MED ORDER — SODIUM CHLORIDE 0.9 % IV SOLN: INTRAVENOUS | Status: DC

## 2022-01-12 MED ORDER — SODIUM CHLORIDE 0.9% FLUSH
3.0000 mL | INTRAVENOUS | Status: DC | PRN
Start: 2022-01-12 — End: 2022-01-12

## 2022-01-12 MED ORDER — IOHEXOL 350 MG/ML SOLN
INTRAVENOUS | Status: DC | PRN
  Administered 2022-01-12: 10 mL

## 2022-01-12 MED ORDER — INSULIN GLARGINE-YFGN 100 UNIT/ML ~~LOC~~ SOLN
20.0000 [IU] | Freq: Every day | SUBCUTANEOUS | Status: DC
Start: 2022-01-12 — End: 2022-01-17
  Administered 2022-01-12 – 2022-01-16 (×5): 20 [IU] via SUBCUTANEOUS
  Filled 2022-01-12 (×7): qty 0.2

## 2022-01-12 SURGICAL SUPPLY — 19 items
BALLN IABP SENSA PLUS 7.5F 40C (BALLOONS) ×1
BALLOON IABP SENS PLUS 7.5F40C (BALLOONS) IMPLANT
CATH SWAN GANZ VIP 7.5F (CATHETERS) IMPLANT
ELECT DEFIB PAD ADLT CADENCE (PAD) IMPLANT
GUIDEWIRE .025 260CM (WIRE) IMPLANT
KIT ESSENTIALS PG (KITS) ×1 IMPLANT
KIT HEART LEFT (KITS) ×1 IMPLANT
KIT MICROPUNCTURE NIT STIFF (SHEATH) IMPLANT
PACK CARDIAC CATHETERIZATION (CUSTOM PROCEDURE TRAY) ×1 IMPLANT
PROTECTION STATION PRESSURIZED (MISCELLANEOUS) ×1
SHEATH PINNACLE 7F 10CM (SHEATH) IMPLANT
SHEATH PINNACLE 8F 10CM (SHEATH) IMPLANT
SHEATH PINNACLE 9F 10CM (SHEATH) IMPLANT
SHEATH PROBE COVER 6X72 (BAG) IMPLANT
SLEEVE REPOSITIONING LENGTH 30 (MISCELLANEOUS) IMPLANT
STATION PROTECTION PRESSURIZED (MISCELLANEOUS) IMPLANT
TRANSDUCER W/STOPCOCK (MISCELLANEOUS) ×1 IMPLANT
WIRE EMERALD 3MM-J .025X260CM (WIRE) IMPLANT
WIRE EMERALD 3MM-J .035X150CM (WIRE) IMPLANT

## 2022-01-12 NOTE — Progress Notes (Signed)
Stephanie Franco  Assessment/ Plan: Pt is a 71 y.o. yo female  w/ hx of anemia, breast cancer, CAD, HFrEF, CKD 3, DM2, gout, HL, HTN, morbid obesity, OSA, pHTN, severe mitral regurgitation w/ LVEF 20-25% who presented on 8/24 for planned TEE for the MR.  Patient became hypoxic, had PEA arrest, intubated, developed AKI and transferred to ICU.  #Acute kidney injury on CKD stage IV: b/l cr arounf 1.7-2.4.  AKI due to cardiac arrest/CHF.  Moved to ICU for inotropes including dobutamine and vasopressors.  Started CRRT on 8/26 and has been tolerating well. Minimal urine output, continue CRRT prescription: All 4K bath, IV heparin for anticoagulation.  UF around 100 cc an hour.  #Acute on chronic systolic CHF/cardiogenic shock: Currently on dobutamine, Levophed and vasopressin.  UF/volume management with CRRT.  Heart failure team is following.  #A-fib with RVR: On amiodarone and anticoagulation.  #VDRF/acute hypoxic respiratory failure: On mechanical ventilation.  Per pulmonary team.  #Anemia: Monitor hemoglobin.  Subjective: Seen and examined in ICU.  Her son was present at the bedside.  The patient is on mechanical ventilation.  Tolerating CRRT well, no problem with the CRRT per the nurse. Objective Vital signs in last 24 hours: Vitals:   01/01/2022 0600 01/02/2022 0615 01/07/2022 0630 01/15/2022 0645  BP:      Pulse: (!) 104 (!) 105 (!) 103 (!) 105  Resp: (!) 22 16 (!) 24 16  Temp: 97.9 F (36.6 C) 97.9 F (36.6 C) 97.9 F (36.6 C) 97.9 F (36.6 C)  TempSrc:      SpO2: 100% 100% 100% 100%  Weight:      Height:       Weight change: -2.9 kg  Intake/Output Summary (Last 24 hours) at 01/10/2022 0821 Last data filed at 01/11/2022 0700 Gross per 24 hour  Intake 2171.15 ml  Output 5200 ml  Net -3028.85 ml       Labs: RENAL PANEL Recent Labs    06/24/21 0550 06/25/21 0535 06/26/21 0500 06/27/21 0446 07/04/21 1440 08/08/21 0830 08/11/21 1415  09/18/21 1808 09/18/21 2103 09/19/21 0427 09/19/21 0921 09/20/21 0436 09/20/21 0827 09/23/21 0536 09/24/21 0354 09/25/21 0321 09/26/21 0214 09/26/21 0214 10/02/21 1324 10/07/21 1513 11/26/21 0913 12/01/21 1531 12/15/21 1526 12/23/21 1410 01/05/22 1040 01/09/22 0034 01/10/22 0106 01/10/22 1744 01/11/22 0357 01/11/22 1610 01/05/2022 0414 12/30/2021 0416  NA 138   < > 135 138   < > 142   < >  --    < >  --    < >  --    < > 137 138 139 139  --  137   < > 137 138 135 134* 135 137 135 131* 135 135  --  136  K 3.7   < > 4.0 4.0   < > 3.2*   < > <2.0*   < >  --    < >  --    < > 3.4* 4.6 4.3 3.8  --  4.7   < > 3.3* 4.0 3.1* 4.4 3.6 3.8 4.9 4.8 4.2 4.4  --  4.3  CL 102   < > 102 101   < > 99   < >  --    < >  --    < >  --    < > 100 102 103 101  --  95*   < > 91* 98 93* 95* 93* 106 99  --  99 100  --  104  CO2 27   < > 25 27   < > 27   < >  --    < >  --    < >  --    < > '28 28 30 28  '$ --  31   < > 33* '23 28 26 29 24 23  '$ --  23 24  --  24  GLUCOSE 132*   < > 189* 136*   < > 105*   < >  --    < >  --    < >  --    < > 126* 138* 144* 119*  --  152*   < > 139* 147* 153* 118* 108* 153* 131*  --  133* 93  --  109*  BUN 32*   < > 28* 28*   < > 18   < >  --    < >  --    < >  --    < > 31* 33* 30* 27*  --  26*   < > 51* 38* 38* 42* 45* 44* 52*  --  39* 24*  --  18  CREATININE 1.54*   < > 1.87* 1.97*   < > 2.17*   < >  --    < >  --    < >  --    < > 2.04* 2.00* 1.93* 1.68*   < > 2.08*   < > 1.94* 2.20* 2.20* 2.21* 2.44* 2.47* 3.60*  --  2.88* 2.03*  --  1.54*  CALCIUM 9.7   < > 9.2 9.8   < > 10.7*   < >  --    < >  --    < >  --    < > 10.0  9.5 10.0 9.8 9.6  --  10.3   < > 10.9* 8.7* 8.9 9.3 9.6 8.4* 8.6*  --  8.3* 8.2*  --  8.0*  MG 1.5*  --  1.2* 2.6*  --  1.9  --  1.9  --  2.2  --  2.2  --   --   --   --   --   --   --   --   --   --   --   --   --   --   --   --  1.9  --   --  2.5*  PHOS  --   --   --   --   --   --   --   --   --  3.3  --   --   --   --   --   --   --   --   --   --   --   --    --   --   --   --   --   --  3.9 3.1 2.9  --   ALBUMIN  --   --   --   --   --  3.5   < >  --   --  2.4*  --   --    < > 2.3* 2.2* 2.1* 2.2*  --  3.6  --  4.3  --   --   --  3.4*  --   --   --  2.9* 2.8*  --  2.8*   < > = values in this interval not displayed.     Liver  Function Tests: Recent Labs  Lab 01/05/22 1040 01/11/22 0357 01/11/22 1610 12/27/2021 0416  AST 30  --   --  154*  ALT 25  --   --  86*  ALKPHOS 75  --   --  74  BILITOT 0.8  --   --  1.0  PROT 7.6  --   --  6.8  ALBUMIN 3.4* 2.9* 2.8* 2.8*   No results for input(s): "LIPASE", "AMYLASE" in the last 168 hours. No results for input(s): "AMMONIA" in the last 168 hours. CBC: Recent Labs    09/25/21 0835 09/26/21 0214 01/10/22 1719 01/10/22 1744 01/11/22 0357 01/11/22 1610 12/27/2021 0416  HGB  --    < > 8.5* 9.5* 8.8* 8.5* 8.5*  MCV  --    < > 86.9  --  84.6 84.7 87.1  FERRITIN 442*  --   --   --   --   --   --   TIBC 319  --   --   --   --   --   --   IRON 29  --   --   --   --   --   --    < > = values in this interval not displayed.    Cardiac Enzymes: No results for input(s): "CKTOTAL", "CKMB", "CKMBINDEX", "TROPONINI" in the last 168 hours. CBG: Recent Labs  Lab 01/11/22 1609 01/11/22 1944 01/11/22 2348 01/11/2022 0344 12/26/2021 0734  GLUCAP 97 104* 121* 98 90    Iron Studies: No results for input(s): "IRON", "TIBC", "TRANSFERRIN", "FERRITIN" in the last 72 hours. Studies/Results: DG Chest Port 1 View  Result Date: 12/22/2021 CLINICAL DATA:  Respiratory failure. EXAM: PORTABLE CHEST 1 VIEW COMPARISON:  January 11, 2022. FINDINGS: Stable cardiomegaly. Endotracheal and nasogastric tubes are unchanged in position. Bilateral internal jugular catheters are unchanged. No acute pulmonary abnormality is noted. Bony thorax is unremarkable. IMPRESSION: Stable support apparatus.  No acute pulmonary abnormality seen. Electronically Signed   By: Marijo Conception M.D.   On: 12/17/2021 08:17   DG Chest Port 1  View  Result Date: 01/11/2022 CLINICAL DATA:  Shortness of breath EXAM: PORTABLE CHEST 1 VIEW COMPARISON:  Prior chest x-ray yesterday 01/10/2022 FINDINGS: Patient is intubated. The tip of the endotracheal tube is 2.1 cm above the carina. Left IJ central venous catheter in good position with the tip overlying the superior cavoatrial junction. Right IJ temporary hemodialysis catheter with the tip at the cavoatrial junction. A gastric tube is present. The tip overlies the gastric fundus. Stable pulmonary vascular congestion without overt edema. Marked enlargement of the cardiopericardial silhouette. No pneumothorax. No new airspace infiltrates. IMPRESSION: 1. Stable and satisfactory support apparatus. 2. Cardiomegaly and pulmonary vascular congestion without overt edema. Electronically Signed   By: Jacqulynn Cadet M.D.   On: 01/11/2022 08:16   DG CHEST PORT 1 VIEW  Result Date: 01/10/2022 CLINICAL DATA:  Status post hemodialysis catheter insertion. EXAM: PORTABLE CHEST 1 VIEW COMPARISON:  Earlier today. FINDINGS: Interval right jugular catheter with its tip in the superior vena cava near the superior cavoatrial junction. Stable enlarged cardiac silhouette and calcified AP window and subcarinal lymph nodes. The pulmonary vasculature remains mildly prominent. Clear lungs. No pneumothorax. No acute bony abnormality. The endotracheal tube tip remains 1.8 cm above the carina. IMPRESSION: 1. Satisfactory position of the right jugular catheter without pneumothorax. 2. Stable cardiomegaly and mild pulmonary vascular congestion. 3. The endotracheal tube tip remains 1.8 cm above the carina. Again, this  could be retracted 2 cm for better positioning. Electronically Signed   By: Claudie Revering M.D.   On: 01/10/2022 17:48   ECHOCARDIOGRAM LIMITED  Result Date: 01/10/2022    ECHOCARDIOGRAM LIMITED REPORT   Patient Name:   TEPHANIE ESCORCIA Date of Exam: 01/10/2022 Medical Rec #:  [id number]        Height:       63.0 in  Accession #:    [id number]       Weight:       179.0 lb Date of Birth:  03/20/1951        BSA:          1.845 m Patient Age:    88 years         BP:           90/74 mmHg Patient Gender: F                HR:           90 bpm. Exam Location:  Inpatient Procedure: Limited Echo, Limited Color Doppler and Cardiac Doppler STAT ECHO Indications:    cardiac arrest  History:        Patient has prior history of Echocardiogram examinations, most                 recent 12/25/2021. Cardiomyopathy, CAD, Pulmonary HTN,                 Arrythmias:Atrial Fibrillation; Risk Factors:Hypertension,                 Dyslipidemia and Sleep Apnea.  Sonographer:    [place] Referring Phys: [id number] [place]  1. Left ventricular ejection fraction, by estimation, is 25 to 30%. The left ventricle has severely decreased function. The left ventricle demonstrates global hypokinesis. There is the interventricular septum is flattened in systole and diastole, consistent with right ventricular pressure and volume overload.  2. Right ventricular systolic function is moderately reduced. The right ventricular size is moderately enlarged. There is severely elevated pulmonary artery systolic pressure. The estimated right ventricular systolic pressure is 00.9 mmHg.  3. Left atrial size was severely dilated.  4. Right atrial size was severely dilated.  5. A small pericardial effusion is present. The pericardial effusion is localized near the right ventricle.  6. Severe mitral valve regurgitation.  7. Tricuspid valve regurgitation is severe.  8. The inferior vena cava is dilated in size with <50% respiratory variability, suggesting right atrial pressure of 15 mmHg.  9. Evidence of atrial level shunting detected by color flow Doppler. There is a moderately sized secundum atrial septal defect with predominantly left to right shunting across the atrial septum. Comparison(s): Changes from prior study are noted. 01/11/2022 (TEE): LVEF  25-30%, ASD 4x7 mm, severe biatrial enlargement, severe MR, moderate to severe TR. Conclusion(s)/Recommendation(s): Critical findings reported to Dr. Tamala Julian and acknowledged at 4:15 pm. FINDINGS  Left Ventricle: Left ventricular ejection fraction, by estimation, is 25 to 30%. The left ventricle has severely decreased function. The left ventricle demonstrates global hypokinesis. The interventricular septum is flattened in systole and diastole, consistent with right ventricular pressure and volume overload. Right Ventricle: The right ventricular size is moderately enlarged. No increase in right ventricular wall thickness. Right ventricular systolic function is moderately reduced. There is severely elevated pulmonary artery systolic pressure. The tricuspid regurgitant velocity is 3.61 m/s, and with an assumed right atrial pressure of 15 mmHg, the estimated right ventricular systolic pressure is  67.1 mmHg. Left Atrium: Left atrial size was severely dilated. Right Atrium: Right atrial size was severely dilated. Pericardium: A small pericardial effusion is present. The pericardial effusion is localized near the right ventricle. Mitral Valve: Severe mitral valve regurgitation. Tricuspid Valve: Tricuspid valve regurgitation is severe. Venous: The inferior vena cava is dilated in size with less than 50% respiratory variability, suggesting right atrial pressure of 15 mmHg. IAS/Shunts: Evidence of atrial level shunting detected by color flow Doppler. There is a moderately sized secundum atrial septal defect with predominantly left to right shunting across the atrial septum. TRICUSPID VALVE TR Peak grad:   52.1 mmHg TR Vmax:        361.00 cm/s Lyman Bishop MD Electronically signed by Lyman Bishop MD Signature Date/Time: 01/10/2022/4:30:12 PM    Final    DG CHEST PORT 1 VIEW  Result Date: 01/10/2022 CLINICAL DATA:  OG tube placement EXAM: PORTABLE CHEST 1 VIEW COMPARISON:  Same day radiograph FINDINGS: Interval placement of  endotracheal tube terminating approximately 1.8 cm above the carina. Interval placement of enteric tube which courses below the diaphragm with distal tip coiled in the gastric fundus. Left IJ central venous catheter is stable in positioning. Multiple overlying cardiac leads. Stable cardiomegaly. No new airspace consolidation. No pneumothorax. IMPRESSION: 1. Interval placement of endotracheal tube with distal tip 1.8 cm above the carina. Consider 2 cm of retraction for more optimal positioning. 2. Satisfactory positioning of OG tube. 3. Otherwise, stable chest. Electronically Signed   By: Davina Poke D.O.   On: 01/10/2022 15:59   DG CHEST PORT 1 VIEW  Result Date: 01/10/2022 CLINICAL DATA:  Central line placement EXAM: PORTABLE CHEST 1 VIEW COMPARISON:  01/05/2022 FINDINGS: Interval placement of left IJ approach central venous catheter with distal tip projecting near the proximal right atrium. Heart is markedly enlarged. Aortic atherosclerosis. No new airspace consolidation. No pneumothorax. Lytic lesion in the distal right clavicle again seen. IMPRESSION: Interval placement of left IJ approach central venous catheter with distal tip projecting near the proximal right atrium. No pneumothorax. Electronically Signed   By: Davina Poke D.O.   On: 01/10/2022 14:21    Medications: Infusions:   prismasol BGK 4/2.5 400 mL/hr at 01/11/22 2149    prismasol BGK 4/2.5 400 mL/hr at 01/11/22 2149   sodium chloride Stopped (01/10/22 2245)   amiodarone 30 mg/hr (01/07/2022 0700)   ceFEPime (MAXIPIME) IV Stopped (01/11/22 2300)   DOBUTamine 5 mcg/kg/min (12/26/2021 0700)   heparin 500 Units/hr (01/04/2022 0700)   norepinephrine (LEVOPHED) Adult infusion 38 mcg/min (12/23/2021 0700)   prismasol BGK 4/2.5 1,500 mL/hr at 01/06/2022 0741   propofol (DIPRIVAN) infusion 20 mcg/kg/min (12/20/2021 0700)   vasopressin 0.03 Units/min (01/05/2022 0821)    Scheduled Medications:  allopurinol  50 mg Per Tube QPM   atorvastatin   80 mg Per Tube Daily   Chlorhexidine Gluconate Cloth  6 each Topical Daily   docusate  100 mg Per Tube BID   ezetimibe  10 mg Per Tube Daily   insulin aspart  0-15 Units Subcutaneous Q4H   insulin glargine-yfgn  20 Units Subcutaneous QHS   multivitamin with minerals  1 tablet Per Tube Daily   mouth rinse  15 mL Mouth Rinse Q2H   pantoprazole (PROTONIX) IV  40 mg Intravenous Q12H   polyethylene glycol  17 g Per Tube Daily   senna-docusate  1 tablet Per Tube BID   sodium chloride flush  10-40 mL Intracatheter Q12H   sodium chloride flush  3 mL  Intravenous Q12H    have reviewed scheduled and prn medications.  Physical Exam: General: Critically ill-looking female, intubated and sedated. Heart:RRR, s1s2 nl Lungs: Coarse breath sound bilateral Abdomen:soft, Non-tender, non-distended Extremities:No edema Dialysis Access: Right IJ temporary HD catheter in place  Ramonda Galyon Prasad Amrie Gurganus 01/02/2022,8:21 AM  LOS: 4 days

## 2022-01-12 NOTE — Progress Notes (Signed)
Initial Nutrition Assessment  DOCUMENTATION CODES:   Not applicable  INTERVENTION:   Tube feeding via OG:  Start Vital 1.5 at 20 ml/hr today Goal TF Vital 1.5 at 45 ml/hr Pro-Source TF20 60 mL BID Goal regimen provides 113 g of protein, 1780 kcals, 821 mL of free water  Additional kcals from fat via propofol at this time  Add Renal MVI daily D/C MVI with minerals  NUTRITION DIAGNOSIS:   Inadequate oral intake related to acute illness as evidenced by NPO status.  GOAL:   Patient will meet greater than or equal to 90% of their needs  MONITOR:   Vent status, TF tolerance, Weight trends, Labs  REASON FOR ASSESSMENT:   Ventilator    ASSESSMENT:   71 yo female admitted for mitral clipping which was attempted but unsuccessful, pt developed worsening renal function and increased lethargy followed by brief cardiac arrest and intubation. Pt with AKI on CKD now on CRRT, cardiogenic shock. PMH includes severe mitral regurgitation with LVEF 20-25%, CAD, CKD 3, DM, gout, HL, HTN, OSA  8/24 Admitted, failed mitral clip 8/26 Brief code, Intubated, started on CRRT  Pt remains on vent support, plan for RHC with IABP today Remains on levophed at 38 mc/min, vasopressin at 0.03  Noted Propofol for sedation  OG tube enters stomach per chest xray  Unable to get diet and weight history from patient at this time  Current wt 76 kg; weight yesterday 78.9 kg. Net negative 3L per I/O flow sheet. Noted 5L fluid removed via CRRT yesterday  Labs: reviewed Meds: reviewed   Diet Order:   Diet Order             Diet NPO time specified  Diet effective now                   EDUCATION NEEDS:   Not appropriate for education at this time  Skin:  Skin Assessment: Skin Integrity Issues: Skin Integrity Issues:: Stage II Stage II: sacrum  Last BM:  8/23  Height:   Ht Readings from Last 1 Encounters:  12/19/2021 '5\' 3"'$  (1.6 m)    Weight:   Wt Readings from Last 1 Encounters:   12/23/2021 76 kg   BMI:  Body mass index is 29.68 kg/m.  Estimated Nutritional Needs:   Kcal:  1600-1800 kcals  Protein:  105-120 g  Fluid:  1.5 L  Kerman Passey MS, RDN, LDN, CNSC Registered Dietitian 3 Clinical Nutrition RD Pager and On-Call Pager Number Located in [place]

## 2022-01-12 NOTE — Interval H&P Note (Signed)
History and Physical Interval Note:  01/04/2022 3:38 PM  Stephanie Franco  has presented today for surgery, with the diagnosis of heart failure.  The various methods of treatment have been discussed with the patient and family. After consideration of risks, benefits and other options for treatment, the patient has consented to  Procedure(s): IABP Insertion (N/A) as a surgical intervention.  The patient's history has been reviewed, patient examined, no change in status, stable for surgery.  I have reviewed the patient's chart and labs.  Questions were answered to the patient's satisfaction.     Stephanie Franco

## 2022-01-12 NOTE — Progress Notes (Signed)
[facility] for heparin Indication: atrial fibrillation  No Known Allergies  Patient Measurements: Height: '5\' 3"'$  (160 cm) Weight: 78.9 kg (173 lb 15.1 oz) IBW/kg (Calculated) : 52.4 Heparin Dosing Weight: 81kg  Vital Signs: Temp: 97.5 F (36.4 C) (08/28 0400) Temp Source: Esophageal (08/28 0400) BP: 110/54 (08/28 0323) Pulse Rate: 104 (08/28 0400)  Labs: Recent Labs    01/11/22 0357 01/11/22 1696 01/11/22 0628 01/11/22 0849 01/11/22 1610 01/11/22 1837 12/19/2021 0416  HGB 8.8*  --   --   --  8.5*  --  8.5*  HCT 28.5*  --   --   --  27.7*  --  27.8*  PLT 228  --   --   --  225  --  219  APTT  --  146*   < > 114*  --  115* 124*  HEPARINUNFRC  --  >1.10*  --   --   --   --  >1.10*  CREATININE 2.88*  --   --   --  2.03*  --  1.54*   < > = values in this interval not displayed.     Estimated Creatinine Clearance: 32.8 mL/min (A) (by C-G formula based on SCr of 1.54 mg/dL (H)).   Medical History: Past Medical History:  Diagnosis Date   Anxiety    Aortic atherosclerosis (HCC)    Arthritis    Asthma    Atrial fibrillation, permanent (HCC)    Rate control with Bystolic. CHA2DS2Vasc = 6 (HTN, DM, CHF, Age 71, Female) -> on Pradaxa   Breast cancer ([place]) 2008   S/P mastectomy   CAD S/P percutaneous coronary angioplasty 2006   PCI of circumflex with Taxus DES;; relook-cath Feb 2013: 50-60% short lesion in RCA, 40% ISR Circumflex stent.;  06/2021: Patent LCx stent.  Distal LCx 90% stenosis, proximal-mid RCA 60%.   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 => Recurrent cardiomyopathy 06/2021 => EF 25 to 30% on Echo, CMR showed severe BiV failure (LVEF  36%, diffuse HK; RVEF 32%).  Severe biatrial enlargement.  Moderate MR posterior directed.  Suspect functional (suggested by TEE); noncoronary pattern for delayed gadolinium-suggest myocarditis, & prior Ant MI-subendocardial => Mixed cardiomyopathy   Fibroid, uterine 07/13/2011   "have that now"   GERD (gastroesophageal reflux disease)    Gout    Hyperlipidemia    Hypertension    Hypokalemia    Morbid obesity ([place])    BMI 41   OSA on CPAP    On CPAP   Pneumonia 2018   Pulmonary hypertension, unspecified (HCC)    PAP ~90 mmHg on Echo 12/2017 -- has OSA on CPAP & obesity -> follow-up sleep study showed mild OSA.;  09/22/2021: RHC showed PAP of 86/30 mmHg with LVEDP of 14 mmHg.   Severe mitral regurgitation      Assessment: 58 yoF s/p unsuccessful TEER for mitral valve. Pt on apixaban PTA for hx AFib, now s/p brief cardiac arrest and pharmacy asked to transition to IV heparin. Last apixaban dose was 8/26 1050.  8/28 AM update:  aPTT remains elevated  Hgb stable  Goal of Therapy:  Heparin level 0.3-0.7 units/ml aPTT  66-102 seconds Monitor platelets by anticoagulation protocol: Yes   Plan:  Reduce heparin to 500 units/hr Recheck heparin level and aPTT in 8 hours  Narda Bonds, PharmD, BCPS Clinical Pharmacist Phone: [phone]

## 2022-01-12 NOTE — Progress Notes (Signed)
STRUCTURAL HD NOTE:  Patient Name: Stephanie Franco Date of Encounter: 12/23/2021  [place] Cardiologist: Glenetta Hew, MD   Subjective   Events of weekend noted with patient developing rapidly progressive cardiogenic shock c/b PEA arrest requiring intubation/pressors/ionotropes/CVVHD after failed Mitraclip  Remains intubated/sedated/DBA 5/NE/Vaso/amio/cefepime  CVP 12-14, MAP 60s, I/O -3.1L; Fio2 50%  Cr down to 2.5; WBC 13.2  Tele with AF rates in 100s with occasional ventricular ectopy  Inpatient Medications    Scheduled Meds:  allopurinol  50 mg Per Tube QPM   atorvastatin  80 mg Per Tube Daily   Chlorhexidine Gluconate Cloth  6 each Topical Daily   docusate  100 mg Per Tube BID   ezetimibe  10 mg Per Tube Daily   insulin aspart  0-15 Units Subcutaneous Q4H   insulin glargine-yfgn  20 Units Subcutaneous QHS   multivitamin with minerals  1 tablet Per Tube Daily   mouth rinse  15 mL Mouth Rinse Q2H   pantoprazole (PROTONIX) IV  40 mg Intravenous Q12H   polyethylene glycol  17 g Per Tube Daily   senna-docusate  1 tablet Per Tube BID   sodium chloride flush  10-40 mL Intracatheter Q12H   sodium chloride flush  3 mL Intravenous Q12H   sodium chloride flush  3 mL Intravenous Q12H   Continuous Infusions:   prismasol BGK 4/2.5 400 mL/hr at 12/19/2021 1030    prismasol BGK 4/2.5 400 mL/hr at 12/31/2021 1030   sodium chloride Stopped (01/10/22 2245)   sodium chloride     [START ON 01/13/2022] sodium chloride     amiodarone 60 mg/hr (01/09/2022 1000)   ceFEPime (MAXIPIME) IV Stopped (12/25/2021 0933)   DOBUTamine 5 mcg/kg/min (12/30/2021 1000)   fentaNYL infusion INTRAVENOUS     heparin 500 Units/hr (01/05/2022 1000)   norepinephrine (LEVOPHED) Adult infusion 38 mcg/min (12/27/2021 1000)   prismasol BGK 4/2.5 1,500 mL/hr at 12/25/2021 1107   propofol (DIPRIVAN) infusion 20 mcg/kg/min (12/28/2021 1000)   vasopressin 0.03 Units/min (01/15/2022 1000)   PRN Meds: sodium  chloride, sodium chloride, acetaminophen, albuterol, alteplase, fentaNYL (SUBLIMAZE) injection, heparin, LORazepam, naphazoline-glycerin, ondansetron (ZOFRAN) IV, mouth rinse, oxyCODONE, sodium chloride, sodium chloride flush, sodium chloride flush, sodium chloride flush   Vital Signs    Vitals:   12/18/2021 1015 01/14/2022 1030 01/11/2022 1045 12/18/2021 1100  BP:      Pulse: 96 (!) 103 (!) 103 (!) 105  Resp: '16 18 20 17  '$ Temp: (!) 97.3 F (36.3 C) (!) 97.3 F (36.3 C) (!) 97.3 F (36.3 C) (!) 97.3 F (36.3 C)  TempSrc:      SpO2: 100% 100% 100% 100%  Weight:      Height:        Intake/Output Summary (Last 24 hours) at 12/29/2021 1110 Last data filed at 12/31/2021 1000 Gross per 24 hour  Intake 2447.59 ml  Output 5251 ml  Net -2803.41 ml      12/24/2021    5:00 AM 01/11/2022    5:00 AM 12/25/2021    9:02 AM  Last 3 Weights  Weight (lbs) 167 lb 8.8 oz 173 lb 15.1 oz 179 lb  Weight (kg) 76 kg 78.9 kg 81.194 kg      Telemetry    Atrial fibrillation with occasional ventricular ectopy and NSVT- Personally Reviewed  ECG    Not performed- Personally Reviewed  Physical Exam   GEN: Intubated, sedated Neck: No JVD Cardiac: Irregular rate and rhythm; 2/6 HSM Respiratory: Anterior lung fields clear  GI: Soft, nontender, obese MS: No edema; No deformity. Neuro:  Sedated Psych: Sedated  Labs    High Sensitivity Troponin:  No results for input(s): "TROPONINIHS" in the last 720 hours.   Chemistry Recent Labs  Lab 01/11/22 0357 01/11/22 1610 12/29/2021 0416  NA 135 135 136  K 4.2 4.4 4.3  CL 99 100 104  CO2 '23 24 24  '$ GLUCOSE 133* 93 109*  BUN 39* 24* 18  CREATININE 2.88* 2.03* 1.54*  CALCIUM 8.3* 8.2* 8.0*  MG 1.9  --  2.5*  PROT  --   --  6.8  ALBUMIN 2.9* 2.8* 2.8*  AST  --   --  154*  ALT  --   --  86*  ALKPHOS  --   --  74  BILITOT  --   --  1.0  GFRNONAA 17* 26* 36*  ANIONGAP '13 11 8    '$ Lipids  Recent Labs  Lab 01/11/22 0357  TRIG 51     Hematology Recent Labs  Lab 01/11/22 0357 01/11/22 1610 12/30/2021 0416  WBC 13.5* 15.1* 13.2*  RBC 3.37* 3.27* 3.19*  HGB 8.8* 8.5* 8.5*  HCT 28.5* 27.7* 27.8*  MCV 84.6 84.7 87.1  MCH 26.1 26.0 26.6  MCHC 30.9 30.7 30.6  RDW 19.3* 19.3* 19.6*  PLT 228 225 219   Thyroid No results for input(s): "TSH", "FREET4" in the last 168 hours.  BNPNo results for input(s): "BNP", "PROBNP" in the last 168 hours.  DDimer No results for input(s): "DDIMER" in the last 168 hours.   Radiology    Chest x-ray today demonstrates cardiomegaly and no significant effusions or infiltrates  Cardiac Studies   Limited echocardiogram from August 26 demonstrates an ejection fraction of 25 to 30% with severe mitral regurgitation, severe tricuspid regurgitation, residual ASD, and the right ventricle looks moderately reduced with moderate dysfunction.  There is modest left to right shunting seen.   Assessment & Plan    1.  Cardiogenic shock: Discussed at length with Dr. Aundra Dubin at the bedside.  Given her high pressor requirement, an Impella device would be most optimal to unload her left ventricle and decrease her pressor requirement.  However given the issues we encountered regarding septal crossing if another attempt is pursued having good position in the left ventricle with the wire would be helpful.  I think for this reason we should start with an intra-aortic balloon pump to see if this can allow for some stabilization and decreasing her pressor requirement.  Her co- oximetry and MAP currently are reasonable on dobutamine, norepinephrine, and vasopressin.  She is on CVVHD with good volume removal.  A Swan-Ganz catheter will be implanted later today.  Continue current therapy.  If needed, we could upgrade to an Impella (while not ideal, we could establish wire position in the left ventricle to assist with transseptal crossing but the Impella would likely preclude [place] from using an AV rail strategy if needed).  We  will have to balance how aggressively pursue therapy in light of the patient's other comorbidities including metastatic breast cancer.  Appreciate advanced heart failure management. 2.  Severe probably valvular disease: Patient is TEE on Thursday demonstrated worsening mitral regurgitation and tricuspid regurgitation.  If she is temporized we will consider another attempt at mitral transcatheter edge-to-edge repair.  She has reasonable anatomy with type IIIb mitral regurgitation due to a restricted posterior leaflet.  If another attempt at [place] is pursued, our strategy would be to cross the septum in a  different more inferior position which would hopefully improve her chances at septal crossing.  If were ultimately successful I think we would need to consider closing the residual ASD especially given her moderate RV dysfunction.  I am not sure that she would be able to tolerate additional LV to RV shunting. 3.  AKI: On CVVH circuit; continue heparin drip. 4.  Respiratory failure: On antibiotics; FiO2 is down to 50%.  Cultures pending.  Appreciate critical care management 5.  Atrial fibrillation: Continue amiodarone and heparin infusions. 6.  Metastatic breast cancer: She is not a candidate for an LVAD.  CRITICAL CARE Performed by: Lenna Sciara   Total critical care time: 30 minutes. Critical care time was exclusive of separately billable procedures and treating other patients. Critical care was necessary to treat or prevent imminent or life-threatening deterioration. Critical care was time spent personally by me on the following activities: development of treatment plan with patient and/or surrogate as well as nursing, discussions with consultants, evaluation of patient's response to treatment, examination of patient, obtaining history from patient or surrogate, ordering and performing treatments and interventions, ordering and review of laboratory studies, ordering and review of radiographic studies,  pulse oximetry and re-evaluation of patient's condition.     For questions or updates, please contact [facility] Please consult [email or web address] for contact info under        Signed, Early Osmond, MD  12/29/2021, 11:10 AM

## 2022-01-12 NOTE — H&P (View-Only) (Signed)
Advanced Heart Failure Rounding Note  PCP-Cardiologist: Glenetta Hew, MD   Subjective:    08/27: Milrinone switched to DBA d/t pressor requirement  Continues on 5 DBA, 38 NE (down from 46), 0.03 Vaso. CO-OX 80%.   MAP upper 60s-70s  5L volume removed with CVVH yesterday, negative 3.1L last 24 hrs, weight down 6 lbs. CVP down to about 13.   Intubated and sedated, FiO2 50%.  Cefepime added overnight d/t leukocytosis and increasing pressor requirements. AF. WBC 11>13.15>13K.   Remains in AF 90s-100s with 10-15 PVCs/min   Objective:   Weight Range: 76 kg Body mass index is 29.68 kg/m.   Vital Signs:   Temp:  [97.5 F (36.4 C)-99.3 F (37.4 C)] 97.9 F (36.6 C) (08/28 0645) Pulse Rate:  [94-180] 105 (08/28 0645) Resp:  [11-25] 16 (08/28 0645) BP: (105-115)/(54-60) 110/54 (08/28 0323) SpO2:  [87 %-100 %] 100 % (08/28 0645) FiO2 (%):  [50 %] 50 % (08/28 0323) Weight:  [76 kg] 76 kg (08/28 0500) Last BM Date : 01/07/22  Weight change: Filed Weights   12/22/2021 0902 01/11/22 0500 12/17/2021 0500  Weight: 81.2 kg 78.9 kg 76 kg    Intake/Output:   Intake/Output Summary (Last 24 hours) at 01/01/2022 0823 Last data filed at 12/26/2021 0700 Gross per 24 hour  Intake 2171.15 ml  Output 5200 ml  Net -3028.85 ml      Physical Exam    General:  Well appearing. No resp difficulty HEENT: Normal Neck: Supple. JVP 12-14 cm. Carotids 2+ bilat; no bruits. No lymphadenopathy or thyromegaly appreciated. Cor: PMI nondisplaced. Irregular rate & rhythm. 2/6 HSM LLSB/apex.  Lungs: Clear Abdomen: Soft, nontender, nondistended. No hepatosplenomegaly. No bruits or masses. Good bowel sounds. Extremities: No cyanosis, clubbing, rash, edema Neuro: Alert & orientedx3, cranial nerves grossly intact. moves all 4 extremities w/o difficulty. Affect pleasant   Telemetry   Afib 90s-100s, 10-15 PVCs/min  Labs    CBC Recent Labs    01/11/22 1610 12/28/2021 0416  WBC 15.1* 13.2*   HGB 8.5* 8.5*  HCT 27.7* 27.8*  MCV 84.7 87.1  PLT 225 540   Basic Metabolic Panel Recent Labs    01/11/22 0357 01/11/22 1610 01/14/2022 0414 01/04/2022 0416  NA 135 135  --  136  K 4.2 4.4  --  4.3  CL 99 100  --  104  CO2 23 24  --  24  GLUCOSE 133* 93  --  109*  BUN 39* 24*  --  18  CREATININE 2.88* 2.03*  --  1.54*  CALCIUM 8.3* 8.2*  --  8.0*  MG 1.9  --   --  2.5*  PHOS 3.9 3.1 2.9  --    Liver Function Tests Recent Labs    01/11/22 1610 01/05/2022 0416  AST  --  154*  ALT  --  86*  ALKPHOS  --  74  BILITOT  --  1.0  PROT  --  6.8  ALBUMIN 2.8* 2.8*   No results for input(s): "LIPASE", "AMYLASE" in the last 72 hours. Cardiac Enzymes No results for input(s): "CKTOTAL", "CKMB", "CKMBINDEX", "TROPONINI" in the last 72 hours.  BNP: BNP (last 3 results) Recent Labs    10/07/21 1513 11/12/21 1442 12/01/21 1531  BNP 71.8 91.4 104.2*    ProBNP (last 3 results) No results for input(s): "PROBNP" in the last 8760 hours.   D-Dimer No results for input(s): "DDIMER" in the last 72 hours. Hemoglobin A1C No results for input(s): "HGBA1C" in  the last 72 hours. Fasting Lipid Panel Recent Labs    01/11/22 0357  TRIG 51   Thyroid Function Tests No results for input(s): "TSH", "T4TOTAL", "T3FREE", "THYROIDAB" in the last 72 hours.  Invalid input(s): "FREET3"  Other results:   Imaging    DG Chest Port 1 View  Result Date: 12/19/2021 CLINICAL DATA:  Respiratory failure. EXAM: PORTABLE CHEST 1 VIEW COMPARISON:  January 11, 2022. FINDINGS: Stable cardiomegaly. Endotracheal and nasogastric tubes are unchanged in position. Bilateral internal jugular catheters are unchanged. No acute pulmonary abnormality is noted. Bony thorax is unremarkable. IMPRESSION: Stable support apparatus.  No acute pulmonary abnormality seen. Electronically Signed   By: Marijo Conception M.D.   On: 01/10/2022 08:17     Medications:     Scheduled Medications:  allopurinol  50 mg Per Tube  QPM   atorvastatin  80 mg Per Tube Daily   Chlorhexidine Gluconate Cloth  6 each Topical Daily   docusate  100 mg Per Tube BID   ezetimibe  10 mg Per Tube Daily   insulin aspart  0-15 Units Subcutaneous Q4H   insulin glargine-yfgn  20 Units Subcutaneous QHS   multivitamin with minerals  1 tablet Per Tube Daily   mouth rinse  15 mL Mouth Rinse Q2H   pantoprazole (PROTONIX) IV  40 mg Intravenous Q12H   polyethylene glycol  17 g Per Tube Daily   senna-docusate  1 tablet Per Tube BID   sodium chloride flush  10-40 mL Intracatheter Q12H   sodium chloride flush  3 mL Intravenous Q12H    Infusions:   prismasol BGK 4/2.5 400 mL/hr at 01/11/22 2149    prismasol BGK 4/2.5 400 mL/hr at 01/11/22 2149   sodium chloride Stopped (01/10/22 2245)   amiodarone 30 mg/hr (12/17/2021 0700)   ceFEPime (MAXIPIME) IV Stopped (01/11/22 2300)   DOBUTamine 5 mcg/kg/min (12/25/2021 0700)   heparin 500 Units/hr (01/15/2022 0700)   norepinephrine (LEVOPHED) Adult infusion 38 mcg/min (01/07/2022 0700)   prismasol BGK 4/2.5 1,500 mL/hr at 12/17/2021 0741   propofol (DIPRIVAN) infusion 20 mcg/kg/min (01/11/2022 0700)   vasopressin 0.03 Units/min (01/10/2022 0821)    PRN Medications: sodium chloride, acetaminophen, albuterol, alteplase, fentaNYL (SUBLIMAZE) injection, heparin, LORazepam, naphazoline-glycerin, ondansetron (ZOFRAN) IV, mouth rinse, oxyCODONE, sodium chloride, sodium chloride flush, sodium chloride flush     Assessment/Plan   1. Acute on chronic systolic CHF => cardiogenic shock: Mixed ischemic/nonischemic cardiomyopathy. RHC in 2/23 showed elevated R>L heart filling pressures with pulmonary arterial hypertension and low CI 1.89. Cardiac MRI w/ myocardial LGE in septum is in a noncoronary pattern, ? myocarditis, anterior wall LGE is subendocardial and may be due to prior MI => ?mixed ischemic/nonischemic cardiomyopathy. TEE (6/23) with EF 30-35%, moderate RV dysfunction, severe MR.  She had attempted Mitraclip  placment this admission with failure, now with cardiogenic shock, AKI, and marked volume overload.  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 Intubated on pressors with CVVH running.  She remains on high pressor doses with NE 38 (down from 46), dobutamine 5, and vasopressin 0.03.  Co-ox 80%. Net negative 3100 cc pulling 100 cc/hr net UF via CVVH, CVP down to 13.  - Wean pressors as able.  - Continue CVVH, pull 50-100 cc/hr net negative UF as BP tolerates.  - Discussed with structural heart team. Plan for [place]  with SWAN placement for closer monitoring of hemodynamics and IABP placement to see if we can stabilize her to get her to re-attempt at [place].  Would not place Impella, as re-attempted Mitraclip procedure, which is probably our only good option at this time to improve her function, would require manipulation of wire across the LA to LV (discussed with Dr. Ali Lowe).   - Prognosis is guarded.  She has metastatic breast cancer, not candidate for LVAD.  She has severe MR and TR with biventricular dysfunction (ASD also likely plays at least a small role in decompensation).  Failed Mitraclip earlier this admission.  Think our only possible option would be to get her stable enough to re-attempt Mitraclip though this may be a long shot.  2. Atrial fibrillation: This is permanent, x years. Rate 100s + frequent PVCs (10-15/min).  - Increase amio gtt to 60/hr.  - Heparin gtt for anticoagulation.  3. PVCs: Frequent during 2/23 admission but was also on milrinone. ? Contributing to cardiomyopathy.   - Continues with frequent PVCs. Increasing amio as above. 4. Mitral regurgitation:  TEE (6/23) with severe MR with restricted posterior leaflet in setting of dilation of the LA and LV. mTEER attempted on 8/24 but failed.   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  As above, will try to stabilize her enough to consider repeat Mitraclip though not sure how feasible this will be.  5. AKI on CKD stage 3: AKI in setting of cardiogenic shock, now on CVVH.  - As above, aim for net negative UF up to 100 cc/hr today.  6. Pulmonary hypertension: On 2/23 RHC, PCWP not obtained, so with using LVEDP, PVR is 11 WU suggesting severe pulmonary arterial hypertension.  Etiology unclear.  Sleep study negative.  V/Q scan not suggestive of chronic PE. CT chest did not show interstitial lung disease. Serologic workup negative (ANA, anti-centromere ab, anti-scl70, RF).  Repeat RHC (6/23) showed PCWP 27. 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 7. CAD: Cath 2/23 with prox RCA to Mid RCA lesion 60% stenosis, distal LCx lesion 90% stenosis (similar to the past).  Medical management. Not clear that CAD can explain her cardiomyopathy.  - Continue statin/Zetia  - No ASA given anticoagulation.   8. Acute hypoxemic respiratory failure: Intubated in setting of pulmonary edema.  - CCM following.  9. ID:  Empirically covering with cefepime for possible sepsis.  - BC X 2 pending  Discussion with husband and son, would like her to be full code while we are trying to stabilize her.   Length of Stay: 4  CRITICAL CARE Performed by: Loralie Champagne  Total critical care time: 40 minutes  Critical care time was exclusive of separately billable procedures and treating other patients.  Critical care was necessary to treat or prevent imminent or life-threatening deterioration.  Critical care was time spent personally by me on the following activities: development of treatment plan with patient and/or surrogate as well as nursing, discussions with consultants, evaluation of patient's response to treatment, examination of patient, obtaining history from patient or  surrogate, ordering and performing treatments and interventions, ordering and review of laboratory studies, ordering and review of radiographic studies, pulse oximetry and re-evaluation of patient's condition.   Loralie Champagne. 12/20/2021  Advanced Heart Failure Team Pager [phone] (M-F; 7a - 5p)  Please contact [facility] for night-coverage after hours (5p -7a ) and weekends on [email or web address]

## 2022-01-12 NOTE — Progress Notes (Signed)
NAME:  Stephanie Franco, MRN:  [id number], DOB:  1950-11-22, LOS: 4 ADMISSION DATE:  01/06/2022, CONSULTATION DATE:  01/10/22 REFERRING MD:  Debara Pickett, CHIEF COMPLAINT:  SOB   History of Present Illness:  71 year old woman with hx of afib on eliquis, NICM, CKD, DM2, HLD, HTN p/w symptomatic sevre mitral regurgitation manifested by DOE.  She had attempted mitral clipping via atrial septostomy on 12/29/2021 that was aborted due to inability to cross septum.  Subsequently has developed worsening oliguria and today more somnolence.  Initially PCCM consulted for central access but patient has deteriorated further with brief cardiac arrest due to hypoxemia requiring intubation and transfer to ICU.   Pertinent  Medical History    has a past medical history of Anxiety, Aortic atherosclerosis ([place]), Arthritis, Asthma, Atrial fibrillation, permanent ([place]), Breast cancer ([place]) (2008), CAD S/P percutaneous coronary angioplasty (2006), CHF (congestive heart failure), NYHA class II, chronic, combined ([place]), CKD (chronic kidney disease), stage III ([place]), Complication of anesthesia, Diabetes mellitus, uncontrolled (07/14/2011), Dilated cardiomyopathy ([place]) (06/2021), Fibroid, uterine (07/13/2011), GERD (gastroesophageal reflux disease), Gout, Hyperlipidemia, Hypertension, Hypokalemia, Morbid obesity ([place]), OSA on CPAP, Pneumonia (2018), Pulmonary hypertension, unspecified ([place]), and Severe mitral regurgitation.   Significant Hospital Events: Including procedures, antibiotic start and stop dates in addition to other pertinent events   8/24 admit, failed mitraclip 8/25 worsening BPs, worsening renal function 8/26 PCCM consult, central line placed, brief code, transfer to ICU intubated, started on CVVH 8/28 RHC and balloon pump planned  Interim History / Subjective:  Remains on CRRT and iNO. For RHC and IABP today.  Objective   Blood pressure (!) 110/54, pulse (!) 105, temperature 97.9 F (36.6 C), resp. rate 16,  height '5\' 3"'$  (1.6 m), weight 76 kg, SpO2 100 %. CVP:  [10 mmHg-19 mmHg] 11 mmHg  Vent Mode: PRVC FiO2 (%):  [50 %] 50 % Set Rate:  [20 bmp] 20 bmp Vt Set:  [420 mL] 420 mL PEEP:  [8 cmH20] 8 cmH20 Plateau Pressure:  [18 cmH20-22 cmH20] 18 cmH20   Intake/Output Summary (Last 24 hours) at 01/15/2022 0917 Last data filed at 01/10/2022 0900 Gross per 24 hour  Intake 2226.96 ml  Output 5035 ml  Net -2808.04 ml    Filed Weights   12/17/2021 0902 01/11/22 0500 12/19/2021 0500  Weight: 81.2 kg 78.9 kg 76 kg    Examination: General: Adult female, resting in bed, in NAD. Neuro: Sedated but will open eyes to voice and noxious stimuli. HEENT: [place]/AT. Sclerae anicteric. ETT in place. Cardiovascular: IRIR, no M/R/G.  Lungs: Respirations even and unlabored.  CTA bilaterally, No W/R/R. Abdomen: BS x 4, soft, NT/ND.  Musculoskeletal: No gross deformities, no edema.  Skin: Intact, warm, no rashes.  Assessment & Plan:   Cardiogenic shock - 2/2 Acute on chronic systolic heart failure with ischemic cardiomyopathy Atrial fibrillation, severe MR - s/p failed MitraClip procedure Continue volume removal with CVVH Continue vasopressors and inotropes per CHF team Plan for RHC and IABP today in hopes can get her to the the point of re-attempting mitraclip Continue amiodarone and heparin  Possible sepsis of unclear etiology. Continue empiric Cefepime for now. Follow cultures.  Severe mixed venous, arterial pulmonary hypertension PVR 11 on prior cardiac cath with elevated right and left heart filling pressures Continue inhaled nitric oxide Continue volume removal per CVVH  AKI on chronic kidney disease On CVVH with volume removal, goal 100cc/hr today  Husband and son updated at bedside  Best Practice (right click and "Reselect all  SmartList Selections" daily)   Diet/type: NPO DVT prophylaxis: systemic heparin GI prophylaxis: PPI Lines: Central line Foley:  Yes, and it is still needed Code  Status:  full code - husband and son requested change from DNR to full code while trying to stabilize her Last date of multidisciplinary goals of care discussion [pending]  Critical care time: 35 min.    Montey Hora, [facility] For pager details, please see AMION or use Epic chat  After 1900, please call [facility] for cross coverage needs 01/10/2022, 9:32 AM

## 2022-01-12 NOTE — Progress Notes (Signed)
Advanced Heart Failure Rounding Note  PCP-Cardiologist: Glenetta Hew, MD   Subjective:    08/27: Milrinone switched to DBA d/t pressor requirement  Continues on 5 DBA, 38 NE (down from 46), 0.03 Vaso. CO-OX 80%.   MAP upper 60s-70s  5L volume removed with CVVH yesterday, negative 3.1L last 24 hrs, weight down 6 lbs. CVP down to about 13.   Intubated and sedated, FiO2 50%.  Cefepime added overnight d/t leukocytosis and increasing pressor requirements. AF. WBC 11>13.15>13K.   Remains in AF 90s-100s with 10-15 PVCs/min   Objective:   Weight Range: 76 kg Body mass index is 29.68 kg/m.   Vital Signs:   Temp:  [97.5 F (36.4 C)-99.3 F (37.4 C)] 97.9 F (36.6 C) (08/28 0645) Pulse Rate:  [94-180] 105 (08/28 0645) Resp:  [11-25] 16 (08/28 0645) BP: (105-115)/(54-60) 110/54 (08/28 0323) SpO2:  [87 %-100 %] 100 % (08/28 0645) FiO2 (%):  [50 %] 50 % (08/28 0323) Weight:  [76 kg] 76 kg (08/28 0500) Last BM Date : 01/07/22  Weight change: Filed Weights   12/17/2021 0902 01/11/22 0500 12/25/2021 0500  Weight: 81.2 kg 78.9 kg 76 kg    Intake/Output:   Intake/Output Summary (Last 24 hours) at 12/24/2021 0823 Last data filed at 12/21/2021 0700 Gross per 24 hour  Intake 2171.15 ml  Output 5200 ml  Net -3028.85 ml      Physical Exam    General:  Well appearing. No resp difficulty HEENT: Normal Neck: Supple. JVP 12-14 cm. Carotids 2+ bilat; no bruits. No lymphadenopathy or thyromegaly appreciated. Cor: PMI nondisplaced. Irregular rate & rhythm. 2/6 HSM LLSB/apex.  Lungs: Clear Abdomen: Soft, nontender, nondistended. No hepatosplenomegaly. No bruits or masses. Good bowel sounds. Extremities: No cyanosis, clubbing, rash, edema Neuro: Alert & orientedx3, cranial nerves grossly intact. moves all 4 extremities w/o difficulty. Affect pleasant   Telemetry   Afib 90s-100s, 10-15 PVCs/min  Labs    CBC Recent Labs    01/11/22 1610 01/07/2022 0416  WBC 15.1* 13.2*   HGB 8.5* 8.5*  HCT 27.7* 27.8*  MCV 84.7 87.1  PLT 225 244   Basic Metabolic Panel Recent Labs    01/11/22 0357 01/11/22 1610 01/02/2022 0414 12/26/2021 0416  NA 135 135  --  136  K 4.2 4.4  --  4.3  CL 99 100  --  104  CO2 23 24  --  24  GLUCOSE 133* 93  --  109*  BUN 39* 24*  --  18  CREATININE 2.88* 2.03*  --  1.54*  CALCIUM 8.3* 8.2*  --  8.0*  MG 1.9  --   --  2.5*  PHOS 3.9 3.1 2.9  --    Liver Function Tests Recent Labs    01/11/22 1610 01/10/2022 0416  AST  --  154*  ALT  --  86*  ALKPHOS  --  74  BILITOT  --  1.0  PROT  --  6.8  ALBUMIN 2.8* 2.8*   No results for input(s): "LIPASE", "AMYLASE" in the last 72 hours. Cardiac Enzymes No results for input(s): "CKTOTAL", "CKMB", "CKMBINDEX", "TROPONINI" in the last 72 hours.  BNP: BNP (last 3 results) Recent Labs    10/07/21 1513 11/12/21 1442 12/01/21 1531  BNP 71.8 91.4 104.2*    ProBNP (last 3 results) No results for input(s): "PROBNP" in the last 8760 hours.   D-Dimer No results for input(s): "DDIMER" in the last 72 hours. Hemoglobin A1C No results for input(s): "HGBA1C" in  the last 72 hours. Fasting Lipid Panel Recent Labs    01/11/22 0357  TRIG 51   Thyroid Function Tests No results for input(s): "TSH", "T4TOTAL", "T3FREE", "THYROIDAB" in the last 72 hours.  Invalid input(s): "FREET3"  Other results:   Imaging    DG Chest Port 1 View  Result Date: 01/11/2022 CLINICAL DATA:  Respiratory failure. EXAM: PORTABLE CHEST 1 VIEW COMPARISON:  January 11, 2022. FINDINGS: Stable cardiomegaly. Endotracheal and nasogastric tubes are unchanged in position. Bilateral internal jugular catheters are unchanged. No acute pulmonary abnormality is noted. Bony thorax is unremarkable. IMPRESSION: Stable support apparatus.  No acute pulmonary abnormality seen. Electronically Signed   By: Marijo Conception M.D.   On: 12/19/2021 08:17     Medications:     Scheduled Medications:  allopurinol  50 mg Per Tube  QPM   atorvastatin  80 mg Per Tube Daily   Chlorhexidine Gluconate Cloth  6 each Topical Daily   docusate  100 mg Per Tube BID   ezetimibe  10 mg Per Tube Daily   insulin aspart  0-15 Units Subcutaneous Q4H   insulin glargine-yfgn  20 Units Subcutaneous QHS   multivitamin with minerals  1 tablet Per Tube Daily   mouth rinse  15 mL Mouth Rinse Q2H   pantoprazole (PROTONIX) IV  40 mg Intravenous Q12H   polyethylene glycol  17 g Per Tube Daily   senna-docusate  1 tablet Per Tube BID   sodium chloride flush  10-40 mL Intracatheter Q12H   sodium chloride flush  3 mL Intravenous Q12H    Infusions:   prismasol BGK 4/2.5 400 mL/hr at 01/11/22 2149    prismasol BGK 4/2.5 400 mL/hr at 01/11/22 2149   sodium chloride Stopped (01/10/22 2245)   amiodarone 30 mg/hr (12/25/2021 0700)   ceFEPime (MAXIPIME) IV Stopped (01/11/22 2300)   DOBUTamine 5 mcg/kg/min (12/20/2021 0700)   heparin 500 Units/hr (01/09/2022 0700)   norepinephrine (LEVOPHED) Adult infusion 38 mcg/min (12/31/2021 0700)   prismasol BGK 4/2.5 1,500 mL/hr at 01/01/2022 0741   propofol (DIPRIVAN) infusion 20 mcg/kg/min (01/11/2022 0700)   vasopressin 0.03 Units/min (12/30/2021 0821)    PRN Medications: sodium chloride, acetaminophen, albuterol, alteplase, fentaNYL (SUBLIMAZE) injection, heparin, LORazepam, naphazoline-glycerin, ondansetron (ZOFRAN) IV, mouth rinse, oxyCODONE, sodium chloride, sodium chloride flush, sodium chloride flush     Assessment/Plan   1. Acute on chronic systolic CHF => cardiogenic shock: Mixed ischemic/nonischemic cardiomyopathy. RHC in 2/23 showed elevated R>L heart filling pressures with pulmonary arterial hypertension and low CI 1.89. Cardiac MRI w/ myocardial LGE in septum is in a noncoronary pattern, ? myocarditis, anterior wall LGE is subendocardial and may be due to prior MI => ?mixed ischemic/nonischemic cardiomyopathy. TEE (6/23) with EF 30-35%, moderate RV dysfunction, severe MR.  She had attempted Mitraclip  placment this admission with failure, now with cardiogenic shock, AKI, and marked volume overload.  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 Intubated on pressors with CVVH running.  She remains on high pressor doses with NE 38 (down from 46), dobutamine 5, and vasopressin 0.03.  Co-ox 80%. Net negative 3100 cc pulling 100 cc/hr net UF via CVVH, CVP down to 13.  - Wean pressors as able.  - Continue CVVH, pull 50-100 cc/hr net negative UF as BP tolerates.  - Discussed with structural heart team. Plan for [place]  with SWAN placement for closer monitoring of hemodynamics and IABP placement to see if we can stabilize her to get her to re-attempt at [place].  Would not place [place], as re-attempted Mitraclip procedure, which is probably our only good option at this time to improve her function, would require manipulation of wire across the LA to LV (discussed with Dr. Ali Lowe).   - Prognosis is guarded.  She has metastatic breast cancer, not candidate for LVAD.  She has severe MR and TR with biventricular dysfunction (ASD also likely plays at least a small role in decompensation).  Failed Mitraclip earlier this admission.  Think our only possible option would be to get her stable enough to re-attempt Mitraclip though this may be a long shot.  2. Atrial fibrillation: This is permanent, x years. Rate 100s + frequent PVCs (10-15/min).  - Increase amio gtt to 60/hr.  - Heparin gtt for anticoagulation.  3. PVCs: Frequent during 2/23 admission but was also on milrinone. ? Contributing to cardiomyopathy.   - Continues with frequent PVCs. Increasing amio as above. 4. Mitral regurgitation:  TEE (6/23) with severe MR with restricted posterior leaflet in setting of dilation of the LA and LV. mTEER attempted on 8/24 but failed.   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  As above, will try to stabilize her enough to consider repeat Mitraclip though not sure how feasible this will be.  5. AKI on CKD stage 3: AKI in setting of cardiogenic shock, now on CVVH.  - As above, aim for net negative UF up to 100 cc/hr today.  6. Pulmonary hypertension: On 2/23 RHC, PCWP not obtained, so with using LVEDP, PVR is 11 WU suggesting severe pulmonary arterial hypertension.  Etiology unclear.  Sleep study negative.  V/Q scan not suggestive of chronic PE. CT chest did not show interstitial lung disease. Serologic workup negative (ANA, anti-centromere ab, anti-scl70, RF).  Repeat RHC (6/23) showed PCWP 27. 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 7. CAD: Cath 2/23 with prox RCA to Mid RCA lesion 60% stenosis, distal LCx lesion 90% stenosis (similar to the past).  Medical management. Not clear that CAD can explain her cardiomyopathy.  - Continue statin/Zetia  - No ASA given anticoagulation.   8. Acute hypoxemic respiratory failure: Intubated in setting of pulmonary edema.  - CCM following.  9. ID:  Empirically covering with cefepime for possible sepsis.  - BC X 2 pending  Discussion with husband and son, would like her to be full code while we are trying to stabilize her.   Length of Stay: 4  CRITICAL CARE Performed by: Loralie Champagne  Total critical care time: 40 minutes  Critical care time was exclusive of separately billable procedures and treating other patients.  Critical care was necessary to treat or prevent imminent or life-threatening deterioration.  Critical care was time spent personally by me on the following activities: development of treatment plan with patient and/or surrogate as well as nursing, discussions with consultants, evaluation of patient's response to treatment, examination of patient, obtaining history from patient or  surrogate, ordering and performing treatments and interventions, ordering and review of laboratory studies, ordering and review of radiographic studies, pulse oximetry and re-evaluation of patient's condition.   Loralie Champagne. 01/04/2022  Advanced Heart Failure Team Pager [phone] (M-F; 7a - 5p)  Please contact [facility] for night-coverage after hours (5p -7a ) and weekends on [email or web address]

## 2022-01-12 NOTE — Progress Notes (Signed)
ANTICOAGULATION CONSULT NOTE  Pharmacy Consult for heparin Indication: atrial fibrillation  No Known Allergies  Patient Measurements: Height: '5\' 3"'$  (160 cm) Weight: 76 kg (167 lb 8.8 oz) IBW/kg (Calculated) : 52.4 Heparin Dosing Weight: 81kg  Vital Signs: Temp: 97.3 F (36.3 C) (08/28 1445) Temp Source: Esophageal (08/28 1200) BP: 105/74 (08/28 1636) Pulse Rate: 99 (08/28 1500)  Labs: Recent Labs    01/11/22 0357 01/11/22 7408 01/11/22 0628 01/11/22 0849 01/11/22 1610 01/11/22 1837 12/30/2021 0416 12/27/2021 1600 12/21/2021 1602 12/24/2021 1642  HGB 8.8*  --   --   --  8.5*  --  8.5* 9.5* 9.5* 9.5*  HCT 28.5*  --   --   --  27.7*  --  27.8* 28.0* 28.0* 28.0*  PLT 228  --   --   --  225  --  219  --   --   --   APTT  --  146*   < > 114*  --  115* 124*  --   --   --   HEPARINUNFRC  --  >1.10*  --   --   --   --  >1.10*  --   --   --   CREATININE 2.88*  --   --   --  2.03*  --  1.54*  --   --   --    < > = values in this interval not displayed.     Estimated Creatinine Clearance: 32.2 mL/min (A) (by C-G formula based on SCr of 1.54 mg/dL (H)).   Assessment: 26 yoF s/p unsuccessful TEER for mitral valve. Pt on apixaban PTA for hx AFib, now s/p brief cardiac arrest and pharmacy asked to transition to IV heparin. Last apixaban dose was 8/26 1050.  S/p placement of IABP on 8/28, will lower heparin/aPTT goals.  Goal of Therapy:  Heparin level 0.2-0.5 units/ml aPTT 56-85 seconds Monitor platelets by anticoagulation protocol: Yes   Plan:  Resume heparin infusion at 500 units/hr Recheck heparin level and aPTT in 8 hours  Cesario Weidinger D. Mina Marble, PharmD, BCPS, [place] 01/02/2022, 6:41 PM

## 2022-01-13 ENCOUNTER — Inpatient Hospital Stay (HOSPITAL_COMMUNITY): Payer: Medicare Other

## 2022-01-13 ENCOUNTER — Other Ambulatory Visit: Payer: Self-pay | Admitting: Cardiology

## 2022-01-13 ENCOUNTER — Encounter (HOSPITAL_COMMUNITY): Payer: Self-pay | Admitting: Cardiology

## 2022-01-13 DIAGNOSIS — R570 Cardiogenic shock: Secondary | ICD-10-CM

## 2022-01-13 DIAGNOSIS — I5043 Acute on chronic combined systolic (congestive) and diastolic (congestive) heart failure: Secondary | ICD-10-CM | POA: Diagnosis not present

## 2022-01-13 DIAGNOSIS — J9601 Acute respiratory failure with hypoxia: Secondary | ICD-10-CM | POA: Diagnosis not present

## 2022-01-13 DIAGNOSIS — I340 Nonrheumatic mitral (valve) insufficiency: Secondary | ICD-10-CM | POA: Diagnosis not present

## 2022-01-13 LAB — CBC
HCT: 25.9 % — ABNORMAL LOW (ref 36.0–46.0)
Hemoglobin: 7.9 g/dL — ABNORMAL LOW (ref 12.0–15.0)
MCH: 26.8 pg (ref 26.0–34.0)
MCHC: 30.5 g/dL (ref 30.0–36.0)
MCV: 87.8 fL (ref 80.0–100.0)
Platelets: 182 10*3/uL (ref 150–400)
RBC: 2.95 MIL/uL — ABNORMAL LOW (ref 3.87–5.11)
RDW: 19.9 % — ABNORMAL HIGH (ref 11.5–15.5)
WBC: 11.4 10*3/uL — ABNORMAL HIGH (ref 4.0–10.5)
nRBC: 1.9 % — ABNORMAL HIGH (ref 0.0–0.2)

## 2022-01-13 LAB — POCT I-STAT EG7
Acid-Base Excess: 1 mmol/L (ref 0.0–2.0)
Acid-Base Excess: 1 mmol/L (ref 0.0–2.0)
Acid-Base Excess: 6 mmol/L — ABNORMAL HIGH (ref 0.0–2.0)
Bicarbonate: 26 mmol/L (ref 20.0–28.0)
Bicarbonate: 26.7 mmol/L (ref 20.0–28.0)
Bicarbonate: 28.8 mmol/L — ABNORMAL HIGH (ref 20.0–28.0)
Calcium, Ion: 1.03 mmol/L — ABNORMAL LOW (ref 1.15–1.40)
Calcium, Ion: 1.08 mmol/L — ABNORMAL LOW (ref 1.15–1.40)
Calcium, Ion: 1.08 mmol/L — ABNORMAL LOW (ref 1.15–1.40)
HCT: 26 % — ABNORMAL LOW (ref 36.0–46.0)
HCT: 27 % — ABNORMAL LOW (ref 36.0–46.0)
HCT: 28 % — ABNORMAL LOW (ref 36.0–46.0)
Hemoglobin: 8.8 g/dL — ABNORMAL LOW (ref 12.0–15.0)
Hemoglobin: 9.2 g/dL — ABNORMAL LOW (ref 12.0–15.0)
Hemoglobin: 9.5 g/dL — ABNORMAL LOW (ref 12.0–15.0)
O2 Saturation: 100 %
O2 Saturation: 78 %
O2 Saturation: 85 %
Potassium: 4.5 mmol/L (ref 3.5–5.1)
Potassium: 4.5 mmol/L (ref 3.5–5.1)
Potassium: 4.6 mmol/L (ref 3.5–5.1)
Sodium: 135 mmol/L (ref 135–145)
Sodium: 136 mmol/L (ref 135–145)
Sodium: 136 mmol/L (ref 135–145)
TCO2: 27 mmol/L (ref 22–32)
TCO2: 28 mmol/L (ref 22–32)
TCO2: 30 mmol/L (ref 22–32)
pCO2, Ven: 33.6 mmHg — ABNORMAL LOW (ref 44–60)
pCO2, Ven: 41.5 mmHg — ABNORMAL LOW (ref 44–60)
pCO2, Ven: 44.3 mmHg (ref 44–60)
pH, Ven: 7.387 (ref 7.25–7.43)
pH, Ven: 7.405 (ref 7.25–7.43)
pH, Ven: 7.541 — ABNORMAL HIGH (ref 7.25–7.43)
pO2, Ven: 402 mmHg — ABNORMAL HIGH (ref 32–45)
pO2, Ven: 43 mmHg (ref 32–45)
pO2, Ven: 50 mmHg — ABNORMAL HIGH (ref 32–45)

## 2022-01-13 LAB — MAGNESIUM
Magnesium: 2.6 mg/dL — ABNORMAL HIGH (ref 1.7–2.4)
Magnesium: 2.8 mg/dL — ABNORMAL HIGH (ref 1.7–2.4)

## 2022-01-13 LAB — RENAL FUNCTION PANEL
Albumin: 2.5 g/dL — ABNORMAL LOW (ref 3.5–5.0)
Albumin: 2.6 g/dL — ABNORMAL LOW (ref 3.5–5.0)
Anion gap: 9 (ref 5–15)
Anion gap: 9 (ref 5–15)
BUN: 19 mg/dL (ref 8–23)
BUN: 18 mg/dL (ref 8–23)
CO2: 26 mmol/L (ref 22–32)
CO2: 25 mmol/L (ref 22–32)
Calcium: 8.2 mg/dL — ABNORMAL LOW (ref 8.9–10.3)
Calcium: 8.7 mg/dL — ABNORMAL LOW (ref 8.9–10.3)
Chloride: 100 mmol/L (ref 98–111)
Chloride: 103 mmol/L (ref 98–111)
Creatinine, Ser: 1.51 mg/dL — ABNORMAL HIGH (ref 0.44–1.00)
Creatinine, Ser: 1.3 mg/dL — ABNORMAL HIGH (ref 0.44–1.00)
GFR, Estimated: 37 mL/min — ABNORMAL LOW (ref >60)
GFR, Estimated: 44 mL/min — ABNORMAL LOW (ref >60)
Glucose, Bld: 191 mg/dL — ABNORMAL HIGH (ref 70–99)
Glucose, Bld: 175 mg/dL — ABNORMAL HIGH (ref 70–99)
Phosphorus: 2.3 mg/dL — ABNORMAL LOW (ref 2.5–4.6)
Phosphorus: 1.9 mg/dL — ABNORMAL LOW (ref 2.5–4.6)
Potassium: 3.9 mmol/L (ref 3.5–5.1)
Potassium: 4.3 mmol/L (ref 3.5–5.1)
Sodium: 135 mmol/L (ref 135–145)
Sodium: 137 mmol/L (ref 135–145)

## 2022-01-13 LAB — HEPARIN LEVEL (UNFRACTIONATED): Heparin Unfractionated: >1.1 IU/mL — ABNORMAL HIGH (ref 0.30–0.70)

## 2022-01-13 LAB — GLUCOSE, CAPILLARY
Glucose-Capillary: 122 mg/dL — ABNORMAL HIGH (ref 70–99)
Glucose-Capillary: 148 mg/dL — ABNORMAL HIGH (ref 70–99)
Glucose-Capillary: 179 mg/dL — ABNORMAL HIGH (ref 70–99)
Glucose-Capillary: 178 mg/dL — ABNORMAL HIGH (ref 70–99)
Glucose-Capillary: 144 mg/dL — ABNORMAL HIGH (ref 70–99)
Glucose-Capillary: 161 mg/dL — ABNORMAL HIGH (ref 70–99)
Glucose-Capillary: 179 mg/dL — ABNORMAL HIGH (ref 70–99)

## 2022-01-13 LAB — APTT
aPTT: 70 seconds — ABNORMAL HIGH (ref 24–36)
aPTT: 79 seconds — ABNORMAL HIGH (ref 24–36)

## 2022-01-13 LAB — COOXEMETRY PANEL
Carboxyhemoglobin: UNDETERMINED % (ref 0.5–1.5)
Carboxyhemoglobin: 2.1 % — ABNORMAL HIGH (ref 0.5–1.5)
Methemoglobin: UNDETERMINED % (ref 0.0–1.5)
Methemoglobin: 0.8 % (ref 0.0–1.5)
O2 Saturation: UNDETERMINED %
O2 Saturation: 79.5 %
Total hemoglobin: UNDETERMINED g/dL (ref 12.0–16.0)
Total hemoglobin: 8.2 g/dL — ABNORMAL LOW (ref 12.0–16.0)
Total oxygen content: UNDETERMINED % (ref 15.0–23.0)

## 2022-01-13 LAB — PHOSPHORUS: Phosphorus: 1.9 mg/dL — ABNORMAL LOW (ref 2.5–4.6)

## 2022-01-13 MED ORDER — VITAL 1.5 CAL PO LIQD
1000.0000 mL | ORAL | Status: DC
  Administered 2022-01-13 – 2022-01-18 (×6): 1000 mL
  Filled 2022-01-13: qty 1000

## 2022-01-13 MED ORDER — POTASSIUM PHOSPHATES 15 MMOLE/5ML IV SOLN
30.0000 mmol | Freq: Once | INTRAVENOUS | Status: AC
  Administered 2022-01-13: 30 mmol via INTRAVENOUS
  Filled 2022-01-13: qty 10

## 2022-01-13 NOTE — Progress Notes (Signed)
NAME:  JERUSALEM BROWNSTEIN, MRN:  [id number], DOB:  Mar 24, 1951, LOS: 5 ADMISSION DATE:  12/31/2021, CONSULTATION DATE:  01/10/22 REFERRING MD:  Debara Pickett, CHIEF COMPLAINT:  SOB   History of Present Illness:  71 year old woman with hx of afib on eliquis, NICM, CKD, DM2, HLD, HTN p/w symptomatic sevre mitral regurgitation manifested by DOE.  She had attempted mitral clipping via atrial septostomy on 12/20/2021 that was aborted due to inability to cross septum.  Subsequently has developed worsening oliguria and today more somnolence.  Initially PCCM consulted for central access but patient has deteriorated further with brief cardiac arrest due to hypoxemia requiring intubation and transfer to ICU.   Pertinent  Medical History    has a past medical history of Anxiety, Aortic atherosclerosis ([place]), Arthritis, Asthma, Atrial fibrillation, permanent ([place]), Breast cancer ([place]) (2008), CAD S/P percutaneous coronary angioplasty (2006), CHF (congestive heart failure), NYHA class II, chronic, combined ([place]), CKD (chronic kidney disease), stage III ([place]), Complication of anesthesia, Diabetes mellitus, uncontrolled (07/14/2011), Dilated cardiomyopathy ([place]) (06/2021), Fibroid, uterine (07/13/2011), GERD (gastroesophageal reflux disease), Gout, Hyperlipidemia, Hypertension, Hypokalemia, Morbid obesity ([place]), OSA on CPAP, Pneumonia (2018), Pulmonary hypertension, unspecified ([place]), and Severe mitral regurgitation.   Significant Hospital Events: Including procedures, antibiotic start and stop dates in addition to other pertinent events   8/24 admit, failed mitraclip 8/25 worsening BPs, worsening renal function 8/26 PCCM consult, central line placed, brief code, transfer to ICU intubated, started on CVVH 8/28 RHC and balloon pump placed  Interim History / Subjective:  Remains on CRRT and iNO. IABP at 1:1. Levo down some, propofol being switched to fentanyl.  Objective   Blood pressure (!) 113/50, pulse (!) 224,  temperature (!) 97.3 F (36.3 C), resp. rate 15, height '5\' 3"'$  (1.6 m), weight 73.8 kg, SpO2 100 %. CVP:  [9 mmHg-24 mmHg] 11 mmHg  Vent Mode: PRVC FiO2 (%):  [50 %] 50 % Set Rate:  [18 bmp-20 bmp] 18 bmp Vt Set:  [420 mL] 420 mL PEEP:  [5 cmH20-8 cmH20] 5 cmH20 Plateau Pressure:  [13 cmH20-20 cmH20] 19 cmH20   Intake/Output Summary (Last 24 hours) at 01/13/2022 0905 Last data filed at 01/13/2022 0900 Gross per 24 hour  Intake 2899.78 ml  Output 5026 ml  Net -2126.22 ml    Filed Weights   01/11/22 0500 01/09/2022 0500 01/13/22 0500  Weight: 78.9 kg 76 kg 73.8 kg    Examination: General: Adult female, resting in bed, in NAD. Neuro: Sedated but will open eyes to voice and noxious stimuli. HEENT: [place]/AT. Sclerae anicteric. ETT in place. Cardiovascular: IRIR, no M/R/G. IABP in place at 1:1. Lungs: Respirations even and unlabored.  CTA bilaterally, No W/R/R. Abdomen: BS x 4, soft, NT/ND.  Musculoskeletal: No gross deformities, no edema.  Skin: Intact, warm, no rashes.  Assessment & Plan:   Cardiogenic shock - 2/2 Acute on chronic systolic heart failure with ischemic cardiomyopathy Atrial fibrillation, severe MR - s/p failed MitraClip procedure Continue volume removal with CVVH, goal 100/hr today Continue vasopressors and inotropes per CHF team, weaning as able (hopefully d/c propofol will help here) Plan is to ultimately get her to the the point of re-attempting mitraclip Continue amiodarone and heparin  Possible sepsis of unclear etiology. Continue empiric Cefepime for now. Follow cultures.  Severe mixed venous, arterial pulmonary hypertension PVR 11 on prior cardiac cath with elevated right and left heart filling pressures Continue inhaled nitric oxide Continue volume removal per CVVH  AKI on chronic kidney disease On CVVH with  volume removal, goal 100cc/hr today  Husband updated at bedside  Best Practice (right click and "Reselect all SmartList Selections" daily)    Diet/type: tubefeeds and NPO DVT prophylaxis: systemic heparin GI prophylaxis: PPI Lines: Central line Foley:  Yes, and it is still needed Code Status:  full code - husband and son requested change from DNR to full code while trying to stabilize her Last date of multidisciplinary goals of care discussion [pending]  Critical care time: 30 min.    Montey Hora, [facility] For pager details, please see AMION or use Epic chat  After 1900, please call [facility] for cross coverage needs 01/13/2022, 9:05 AM

## 2022-01-13 NOTE — Progress Notes (Signed)
ANTICOAGULATION CONSULT NOTE  Pharmacy Consult for heparin Indication: atrial fibrillation  No Known Allergies  Patient Measurements: Height: '5\' 3"'$  (160 cm) Weight: 73.8 kg (162 lb 11.2 oz) IBW/kg (Calculated) : 52.4 Heparin Dosing Weight: 81kg  Vital Signs: Temp: 97.5 F (36.4 C) (08/29 1715) Temp Source: Esophageal (08/29 1600) BP: 110/53 (08/29 1700)  Labs: Recent Labs    01/11/22 0628 01/11/22 0849 01/01/2022 0416 12/28/2021 1600 12/31/2021 1642 12/16/2021 1852 01/10/2022 1945 01/13/22 0221 01/13/22 0442 01/13/22 1501 01/13/22 1648  HGB  --    < > 8.5*   < > 9.5*  --  7.9*  --  7.9*  --   --   HCT  --    < > 27.8*   < > 28.0*  --  25.9*  --  25.9*  --   --   PLT  --    < > 219  --   --   --  186  --  182  --   --   APTT 146*   < > 124*  --   --   --   --  70*  --  79*  --   HEPARINUNFRC >1.10*  --  >1.10*  --   --   --   --  >1.10*  --   --   --   CREATININE  --    < > 1.54*  --   --  1.75*  --   --  1.51*  --  1.30*   < > = values in this interval not displayed.     Estimated Creatinine Clearance: 37.7 mL/min (A) (by C-G formula based on SCr of 1.3 mg/dL (H)).   Assessment: 1 yoF s/p unsuccessful TEER for mitral valve. Pt on apixaban PTA for hx AFib, now s/p brief cardiac arrest and pharmacy asked to transition to IV heparin. Last apixaban dose was 8/26 1050.  S/p placement of IABP on 8/28, will lower heparin/aPTT goals.  aPTT this AM is within goal range at 70 seconds.   Repeat this afternoon aptt 79sec at goal  Heparin level falsely elevated as expected s/p recent apixaban.  No overt bleeding or complications noted.  Goal of Therapy:  Heparin level 0.2-0.5 units/ml aPTT 56-85 seconds Monitor platelets by anticoagulation protocol: Yes   Plan:  Continue IV heparin at 500 units/hr. Continue daily heparin level, aPTT and CBC.     Bonnita Nasuti Pharm.D. CPP, BCPS Clinical Pharmacist [phone] 01/13/2022 5:46 PM     [facility] pharmacy phone numbers are listed  on [email or web address]

## 2022-01-13 NOTE — Progress Notes (Signed)
ANTICOAGULATION CONSULT NOTE  Pharmacy Consult for heparin Indication: atrial fibrillation  No Known Allergies  Patient Measurements: Height: '5\' 3"'$  (160 cm) Weight: 73.8 kg (162 lb 11.2 oz) IBW/kg (Calculated) : 52.4 Heparin Dosing Weight: 81kg  Vital Signs: Temp: 97.7 F (36.5 C) (08/29 1445) Temp Source: Esophageal (08/29 1200) BP: 120/40 (08/29 1400) Pulse Rate: 224 (08/29 0350)  Labs: Recent Labs    01/11/22 0628 01/11/22 0849 01/11/22 1837 12/24/2021 0416 12/18/2021 1600 12/23/2021 1642 12/16/2021 1852 01/03/2022 1945 01/13/22 0221 01/13/22 0442  HGB  --    < >  --  8.5*   < > 9.5*  --  7.9*  --  7.9*  HCT  --    < >  --  27.8*   < > 28.0*  --  25.9*  --  25.9*  PLT  --    < >  --  219  --   --   --  186  --  182  APTT 146*   < > 115* 124*  --   --   --   --  70*  --   HEPARINUNFRC >1.10*  --   --  >1.10*  --   --   --   --  >1.10*  --   CREATININE  --    < >  --  1.54*  --   --  1.75*  --   --  1.51*   < > = values in this interval not displayed.     Estimated Creatinine Clearance: 32.4 mL/min (A) (by C-G formula based on SCr of 1.51 mg/dL (H)).   Assessment: 37 yoF s/p unsuccessful TEER for mitral valve. Pt on apixaban PTA for hx AFib, now s/p brief cardiac arrest and pharmacy asked to transition to IV heparin. Last apixaban dose was 8/26 1050.  S/p placement of IABP on 8/28, will lower heparin/aPTT goals.  aPTT this AM is within goal range at 70 seconds.  Heparin level falsely elevated as expected s/p recent apixaban.  No overt bleeding or complications noted.  Goal of Therapy:  Heparin level 0.2-0.5 units/ml aPTT 56-85 seconds Monitor platelets by anticoagulation protocol: Yes   Plan:  Continue IV heparin at 500 units/hr. Repeat aPTT now. Continue daily heparin level, aPTT and CBC.  [place] Crane, Roylene Reason, BCCP Clinical Pharmacist  01/13/2022 2:55 PM   [facility] pharmacy phone numbers are listed on [email or web address]

## 2022-01-13 NOTE — Progress Notes (Signed)
Patient ID: Stephanie Franco, female   DOB: Apr 26, 1951, 71 y.o.   MRN: [id number]     Advanced Heart Failure Rounding Note  PCP-Cardiologist: Glenetta Hew, MD   Subjective:    08/27: Milrinone switched to DBA d/t pressor requirement 08/28: RHC + IABP placement. Unable to keep Swan in position due to pulsatility of heart with severe TR/MR so removed.   Continues on 5 DBA, 26 NE (down from 38), off vasopressin. IABP 1:1, good position on today's CXR.  CO-OX 79.5%. MAP stable.   I/Os net negative yesterday pulling UF via CVVH 100-150 cc/hr, CVP down to 17.   Intubated and sedated, on empiric cefepime.  Afebrile.   Remains in AF 80s-90s on heparin gtt and amiodarone 60.   Heart Pressures RHC Procedural Findings: Hemodynamics (mmHg) RA mean 22, V wave to 32 RV 74/18 PA 78/33, mean 51 PCWP mean 28, V wave to 38  Oxygen saturations: IVC 73%  RA 85% RV 85% PA 85% AO 100%  Cardiac Output (Fick) 13 (high output with shunt) Cardiac Index (Fick) 7.7  PVR < 2 WU  Shunt fraction 1.8/1    Objective:   Weight Range: 73.8 kg Body mass index is 28.82 kg/m.   Vital Signs:   Temp:  [92.7 F (33.7 C)-99.3 F (37.4 C)] 97 F (36.1 C) (08/29 0730) Pulse Rate:  [93-242] 224 (08/29 0350) Resp:  [14-26] 20 (08/29 0730) BP: (90-124)/(36-74) 113/50 (08/29 0700) SpO2:  [90 %-100 %] 100 % (08/29 0715) Arterial Line BP: (89-124)/(36-56) 98/50 (08/29 0730) FiO2 (%):  [50 %] 50 % (08/29 0350) Weight:  [73.8 kg] 73.8 kg (08/29 0500) Last BM Date : 01/07/22  Weight change: Filed Weights   01/11/22 0500 01/07/2022 0500 01/13/22 0500  Weight: 78.9 kg 76 kg 73.8 kg    Intake/Output:   Intake/Output Summary (Last 24 hours) at 01/13/2022 0753 Last data filed at 01/13/2022 0700 Gross per 24 hour  Intake 2861.45 ml  Output 5195 ml  Net -2333.55 ml      Physical Exam    General: Awake on vent Neck: JVP 16 cm, no thyromegaly or thyroid nodule.  Lungs: Decreased at bases.  CV:  Nondisplaced PMI.  Heart regular S1/S2, no S3/S4, 2/6 HSM LLSB/apex.  1+ ankle edema.  Abdomen: Soft, nontender, no hepatosplenomegaly, no distention.  Skin: Intact without lesions or rashes.  Neurologic: Awake/follows commands. . Extremities: No clubbing or cyanosis.  HEENT: Normal.    Telemetry   Afib 90s-100s, 10-15 PVCs/min  Labs    CBC Recent Labs    01/15/2022 1945 01/13/22 0442  WBC 12.1* 11.4*  HGB 7.9* 7.9*  HCT 25.9* 25.9*  MCV 86.3 87.8  PLT 186 829   Basic Metabolic Panel Recent Labs    12/29/2021 1852 01/13/22 0442  NA 135 135  K 4.3 3.9  CL 102 100  CO2 22 26  GLUCOSE 142* 191*  BUN 18 19  CREATININE 1.75* 1.51*  CALCIUM 8.4* 8.2*  MG 2.6* 2.6*  PHOS 3.9  3.9 2.3*   Liver Function Tests Recent Labs    01/11/2022 0416 01/09/2022 1852 01/13/22 0442  AST 154*  --   --   ALT 86*  --   --   ALKPHOS 74  --   --   BILITOT 1.0  --   --   PROT 6.8  --   --   ALBUMIN 2.8* 2.6* 2.5*   No results for input(s): "LIPASE", "AMYLASE" in the last 72 hours. Cardiac Enzymes No  results for input(s): "CKTOTAL", "CKMB", "CKMBINDEX", "TROPONINI" in the last 72 hours.  BNP: BNP (last 3 results) Recent Labs    10/07/21 1513 11/12/21 1442 12/01/21 1531  BNP 71.8 91.4 104.2*    ProBNP (last 3 results) No results for input(s): "PROBNP" in the last 8760 hours.   D-Dimer No results for input(s): "DDIMER" in the last 72 hours. Hemoglobin A1C No results for input(s): "HGBA1C" in the last 72 hours. Fasting Lipid Panel Recent Labs    01/11/22 0357  TRIG 51   Thyroid Function Tests No results for input(s): "TSH", "T4TOTAL", "T3FREE", "THYROIDAB" in the last 72 hours.  Invalid input(s): "FREET3"  Other results:   Imaging    DG Chest Port 1 View  Result Date: 01/07/2022 CLINICAL DATA:  Intra-aortic balloon pump assist EXAM: PORTABLE CHEST 1 VIEW COMPARISON:  Previous studies including the examination done earlier today FINDINGS: Transverse diameter of  heart is increased. Central pulmonary vessels are slightly less prominent. There are no signs of alveolar pulmonary edema or focal pulmonary consolidation. There is no pleural effusion or pneumothorax. Tip of endotracheal tube is 2.5 cm above the carina. Tip of right IJ central venous catheter is seen in superior vena cava. Tip of left IJ central venous catheter is seen in superior vena cava close to the right atrium. Tip of NG tube is seen in the stomach. There is a new small linear metallic density in the region of the proximal descending thoracic aorta close to the aortic arch consistent with the history of intra-aortic balloon pump. IMPRESSION: Cardiomegaly. There is decrease in pulmonary vascular congestion. There are no new infiltrates. Support devices as described in the body of the report. Electronically Signed   By: Elmer Picker M.D.   On: 12/17/2021 18:10   CARDIAC CATHETERIZATION  Result Date: 01/11/2022 1. High right and left heart filling pressures with prominent V waves. 2. Pulmonary venous hypertension 3. High cardiac output in setting of ASD 4. Atrial level left => right shunt, shunt fraction 1.8/1 5. IABP placed 6. Swan removed as unable to maintain position in PA due to excessive pulsatility from severe TR.     Medications:     Scheduled Medications:  allopurinol  50 mg Per Tube QPM   atorvastatin  80 mg Per Tube Daily   Chlorhexidine Gluconate Cloth  6 each Topical Daily   docusate  100 mg Per Tube BID   ezetimibe  10 mg Per Tube Daily   feeding supplement (PROSource TF20)  60 mL Per Tube Daily   insulin aspart  0-15 Units Subcutaneous Q4H   insulin glargine-yfgn  20 Units Subcutaneous QHS   multivitamin  1 tablet Oral QHS   mouth rinse  15 mL Mouth Rinse Q2H   pantoprazole (PROTONIX) IV  40 mg Intravenous Q12H   polyethylene glycol  17 g Per Tube Daily   senna-docusate  1 tablet Per Tube BID   sodium chloride flush  10-40 mL Intracatheter Q12H   sodium chloride  flush  3 mL Intravenous Q12H    Infusions:   prismasol BGK 4/2.5 400 mL/hr at 01/13/22 0315    prismasol BGK 4/2.5 400 mL/hr at 01/13/22 0315   sodium chloride Stopped (01/10/22 2245)   amiodarone 60 mg/hr (01/13/22 0700)   ceFEPime (MAXIPIME) IV Stopped (12/28/2021 2216)   DOBUTamine 5 mcg/kg/min (01/13/22 0700)   feeding supplement (VITAL 1.5 CAL) 20 mL/hr at 01/13/22 0700   fentaNYL infusion INTRAVENOUS Stopped (12/28/2021 1112)   heparin 500 Units/hr (01/13/22 0700)  norepinephrine (LEVOPHED) Adult infusion 28 mcg/min (01/13/22 0700)   prismasol BGK 4/2.5 1,500 mL/hr at 01/13/22 0432   propofol (DIPRIVAN) infusion 25 mcg/kg/min (01/13/22 0700)   vasopressin Stopped (01/11/2022 2024)    PRN Medications: sodium chloride, acetaminophen, albuterol, alteplase, fentaNYL (SUBLIMAZE) injection, heparin, LORazepam, naphazoline-glycerin, ondansetron (ZOFRAN) IV, mouth rinse, oxyCODONE, sodium chloride, sodium chloride flush, sodium chloride flush     Assessment/Plan   1. Acute on chronic systolic CHF => cardiogenic shock: Mixed ischemic/nonischemic cardiomyopathy. RHC in 2/23 showed elevated R>L heart filling pressures with pulmonary arterial hypertension and low CI 1.89. Cardiac MRI w/ myocardial LGE in septum is in a noncoronary pattern, ? myocarditis, anterior wall LGE is subendocardial and may be due to prior MI => ?mixed ischemic/nonischemic cardiomyopathy. TEE (6/23) with EF 30-35%, moderate RV dysfunction, severe MR.  She had attempted Mitraclip placment this admission with failure, now with cardiogenic shock, AKI, and marked volume overload.  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 Intubated on pressors with CVVH running.  IABP in place, did not place Impella as re-attempted Mitraclip procedure,  which is probably our only good option at this time to improve her function, would require manipulation of wire across the LA to LV (discussed with Dr. Ali Lowe).   She remains NE 26 (down from 38), dobutamine 5 and off vasopressin.  IABP 1:1, good positoin on CXR.  Co-ox 79.5% (in setting of L=>R shunt. Net negative 2333 cc pulling 100 cc/hr net UF via CVVH, CVP 17 today.  We are trying to stabilize her for repeat Mitraclip + iatrogenic ASD closure.  - Continue to wean NE as able.  - Continue CVVH, pull 100 cc/hr net negative UF.   - Prognosis is guarded.  She has metastatic breast cancer, not candidate for LVAD.  She has severe MR and TR with biventricular dysfunction (ASD also likely plays at least a small role in decompensation).  Failed Mitraclip earlier this admission. Think our only possible option would be to get her stable enough to re-attempt Mitraclip + iatrogenic ASD closure though this may be a long shot.  2. Atrial fibrillation: This is permanent, x years. Rate 80s-90s + frequent PVCs (10-15/min).  - Decrease amiodarone gtt to '30mg'$ /hr.  - Heparin gtt for anticoagulation.  3. PVCs: Frequent during 2/23 admission but was also on milrinone. ? Contributing to cardiomyopathy.   - Amiodarone gtt. 4. Mitral regurgitation:  TEE (6/23) with severe MR with restricted posterior leaflet in setting of dilation of the LA and LV. mTEER attempted on 8/24 but failed.  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  As above, will try to stabilize her enough to consider repeat Mitraclip though not sure how feasible this will be.  5. AKI on CKD stage 3: AKI in setting of cardiogenic shock, now on CVVH.  - As above, aim for net negative UF up to 100 cc/hr today.  6. Pulmonary hypertension: RHC this admission with pulmonary venous hypertension.  7. CAD: Cath 2/23 with prox RCA to Mid RCA lesion 60% stenosis, distal LCx lesion 90% stenosis (similar to the past).   Medical management. Not clear that CAD can explain her cardiomyopathy.  - Continue statin/Zetia  - No ASA given anticoagulation.   8. Acute hypoxemic respiratory failure: Intubated in setting of pulmonary edema.  -  CCM following.  9. ID:  Empirically covering with cefepime for possible sepsis.  10. 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  Qp/Qs 1.8 on RHC, will need closure with re-attempt at Mitraclip.  11. Full code for now.    Length of Stay: 5  CRITICAL CARE Performed by: Loralie Champagne  Total critical care time: 40 minutes  Critical care time was exclusive of separately billable procedures and treating other patients.  Critical care was necessary to treat or prevent imminent or life-threatening deterioration.  Critical care was time spent personally by me on the following activities: development of treatment plan with patient and/or surrogate as well as nursing, discussions with consultants, evaluation of patient's response to treatment, examination of patient, obtaining history from patient or surrogate, ordering and performing treatments and interventions, ordering and review of laboratory studies, ordering and review of radiographic studies, pulse oximetry and re-evaluation of patient's condition.   Loralie Champagne. 01/13/2022  Advanced Heart Failure Team Pager [phone] (M-F; 7a - 5p)  Please contact [facility] for night-coverage after hours (5p -7a ) and weekends on [email or web address]

## 2022-01-13 NOTE — Progress Notes (Addendum)
Nutrition Follow-up  DOCUMENTATION CODES:   Not applicable  INTERVENTION:   Tube Feeding via OG:  Vital 1.5 at 45 ml/hr Titrate by 10 mL q 6 until goal rate Pro-Source TF20 60 mL BID Goal regimen provides 113 g of protein, 1780 kcals, 821 mL of free water   Continue Renal MVI daily  NUTRITION DIAGNOSIS:   Inadequate oral intake related to acute illness as evidenced by NPO status.  Being addressed via TF   GOAL:   Patient will meet greater than or equal to 90% of their needs  Progressing  MONITOR:   Vent status, TF tolerance, Weight trends, Labs  REASON FOR ASSESSMENT:   Ventilator    ASSESSMENT:   71 yo female admitted for mitral clipping which was attempted but unsuccessful, pt developed worsening renal function and increased lethargy followed by brief cardiac arrest and intubation. Pt with AKI on CKD now on CRRT, cardiogenic shock. PMH includes severe mitral regurgitation with LVEF 20-25%, CAD, CKD 3, DM, gout, HL, HTN, OSA  8/24 Admitted, failed mitral clip 8/26 Brief code, Intubated, started on CRRT 8/28 RHC and IABP placed  Pt remains on vent support, on CRRT. Pressor requirements improving-remains on levo but weaning, currently down to 28, off vasopressin, remains on dobutamine; changing propofol to fentanyl  Tolerating Vital 1.5 at 20 ml/hr. TF already increased to 30 ml/hr per MD order this AM with plans to titrate by 10 mL q 6 hours to goal rate. RD modified orders to add goal rate  No BM yet bit bowel regimen increased today per RN report. Pt did not receive full bowel regimen yesterday  Labs: phosphorus 2.3 (L) Meds: ss novolog, semglee, Rena-Vite, miralax, senna-docusate, colace   Diet Order:   Diet Order     None       EDUCATION NEEDS:   Not appropriate for education at this time  Skin:  Skin Assessment: Skin Integrity Issues: Skin Integrity Issues:: Stage II Stage II: sacrum  Last BM:  8/23  Height:   Ht Readings from Last 1  Encounters:  01/05/2022 '5\' 3"'$  (1.6 m)    Weight:   Wt Readings from Last 1 Encounters:  01/13/22 73.8 kg     BMI:  Body mass index is 28.82 kg/m.  Estimated Nutritional Needs:   Kcal:  1600-1800 kcals  Protein:  105-120 g  Fluid:  1.5 L   Kerman Passey MS, RDN, LDN, CNSC Registered Dietitian 3 Clinical Nutrition RD Pager and On-Call Pager Number Located in [place]

## 2022-01-13 NOTE — Progress Notes (Signed)
[facility] NEPHROLOGY PROGRESS NOTE  Assessment/ Plan: Pt is a 71 y.o. yo female  w/ hx of anemia, breast cancer, CAD, HFrEF, CKD 3, DM2, gout, HL, HTN, morbid obesity, OSA, pHTN, severe mitral regurgitation w/ LVEF 20-25% who presented on 8/24 for planned TEE for the MR.  Patient became hypoxic, had PEA arrest, intubated, developed AKI and transferred to ICU.  #Acute kidney injury on CKD stage IV: b/l cr arounf 1.7-2.4.  AKI due to cardiac arrest/CHF.  Moved to ICU for inotropes including dobutamine and vasopressors.  Started CRRT on 8/26 and has been tolerating well. Remains anuric, CVP around 17 therefore we will continue CRRT: All 4K bath, IV heparin for anticoagulation.  UF around 100-150 cc an hour.  #Acute on chronic systolic CHF/cardiogenic shock: Currently on dobutamine, Levophed.  UF/volume management with CRRT.  Heart failure team is following.  #A-fib with RVR: On amiodarone and anticoagulation.  #VDRF/acute hypoxic respiratory failure: On mechanical ventilation.  Per pulmonary team.  #Anemia: Monitor hemoglobin.  Discussed with the cardiology and ICU team.  Subjective: Seen and examined in ICU.  No issue with CRRT and has been tolerating UF around 100 cc an hour.  Her husband was present at the bedside and discussed with him.  No other new event.  Urine output is recorded only 46 cc in 24 hours.   Objective Vital signs in last 24 hours: Vitals:   01/13/22 0845 01/13/22 0900 01/13/22 0915 01/13/22 0930  BP:  (!) 109/31    Pulse:      Resp: '15 13 14 '$ (!) 22  Temp: (!) 97.3 F (36.3 C) (!) 97.5 F (36.4 C) (!) 97.5 F (36.4 C) (!) 95.4 F (35.2 C)  TempSrc:      SpO2: 100% 100% 100% 100%  Weight:      Height:       Weight change: -2.2 kg  Intake/Output Summary (Last 24 hours) at 01/13/2022 0957 Last data filed at 01/13/2022 0900 Gross per 24 hour  Intake 2899.78 ml  Output 5270 ml  Net -2370.22 ml        Labs: RENAL PANEL Recent Labs     06/26/21 0500 06/27/21 0446 07/04/21 1440 08/08/21 0830 08/11/21 1415 09/18/21 1808 09/18/21 2103 09/19/21 0427 09/19/21 0921 09/20/21 0436 09/20/21 0827 09/25/21 0321 09/26/21 0214 09/26/21 0214 10/02/21 1324 10/07/21 1513 11/26/21 0913 12/01/21 1531 12/15/21 1526 12/23/21 1410 01/05/22 1040 01/09/22 0034 01/10/22 0106 01/10/22 1744 01/11/22 0357 01/11/22 1610 01/14/2022 0414 01/11/2022 0416 01/04/2022 1600 12/23/2021 1602 12/30/2021 1642 12/19/2021 1852 01/13/22 0442  NA 135 138   < > 142   < >  --    < >  --    < >  --    < > 139 139  --  137   < > 137   < > 135 134* 135 137 135 131* 135 135  --  136 136 136 136 135 135  K 4.0 4.0   < > 3.2*   < > <2.0*   < >  --    < >  --    < > 4.3 3.8  --  4.7   < > 3.3*   < > 3.1* 4.4 3.6 3.8 4.9 4.8 4.2 4.4  --  4.3 4.5 4.5 4.5 4.3 3.9  CL 102 101   < > 99   < >  --    < >  --    < >  --    < >  103 101  --  95*   < > 91*   < > 93* 95* 93* 106 99  --  99 100  --  104  --   --   --  102 100  CO2 25 27   < > 27   < >  --    < >  --    < >  --    < > 30 28  --  31   < > 33*   < > '28 26 29 24 23  '$ --  23 24  --  24  --   --   --  22 26  GLUCOSE 189* 136*   < > 105*   < >  --    < >  --    < >  --    < > 144* 119*  --  152*   < > 139*   < > 153* 118* 108* 153* 131*  --  133* 93  --  109*  --   --   --  142* 191*  BUN 28* 28*   < > 18   < >  --    < >  --    < >  --    < > 30* 27*  --  26*   < > 51*   < > 38* 42* 45* 44* 52*  --  39* 24*  --  18  --   --   --  18 19  CREATININE 1.87* 1.97*   < > 2.17*   < >  --    < >  --    < >  --    < > 1.93* 1.68*   < > 2.08*   < > 1.94*   < > 2.20* 2.21* 2.44* 2.47* 3.60*  --  2.88* 2.03*  --  1.54*  --   --   --  1.75* 1.51*  CALCIUM 9.2 9.8   < > 10.7*   < >  --    < >  --    < >  --    < > 9.8 9.6  --  10.3   < > 10.9*   < > 8.9 9.3 9.6 8.4* 8.6*  --  8.3* 8.2*  --  8.0*  --   --   --  8.4* 8.2*  MG 1.2* 2.6*  --  1.9  --  1.9  --  2.2  --  2.2  --   --   --   --   --   --   --   --   --   --   --   --   --    --  1.9  --   --  2.5*  --   --   --  2.6* 2.6*  PHOS  --   --   --   --   --   --   --  3.3  --   --   --   --   --   --   --   --   --   --   --   --   --   --   --   --  3.9 3.1 2.9  --   --   --   --  3.9  3.9 2.3*  ALBUMIN  --   --   --  3.5   < >  --   --  2.4*  --   --    < > 2.1* 2.2*  --  3.6  --  4.3  --   --   --  3.4*  --   --   --  2.9* 2.8*  --  2.8*  --   --   --  2.6* 2.5*   < > = values in this interval not displayed.      Liver Function Tests: Recent Labs  Lab 01/05/2022 0416 12/27/2021 1852 01/13/22 0442  AST 154*  --   --   ALT 86*  --   --   ALKPHOS 74  --   --   BILITOT 1.0  --   --   PROT 6.8  --   --   ALBUMIN 2.8* 2.6* 2.5*    No results for input(s): "LIPASE", "AMYLASE" in the last 168 hours. No results for input(s): "AMMONIA" in the last 168 hours. CBC: Recent Labs    09/25/21 0835 09/26/21 0214 12/28/2021 1600 01/05/2022 1602 01/13/2022 1642 01/06/2022 1945 01/13/22 0442  HGB  --    < > 9.5* 9.5* 9.5* 7.9* 7.9*  MCV  --    < >  --   --   --  86.3 87.8  FERRITIN 442*  --   --   --   --   --   --   TIBC 319  --   --   --   --   --   --   IRON 29  --   --   --   --   --   --    < > = values in this interval not displayed.     Cardiac Enzymes: No results for input(s): "CKTOTAL", "CKMB", "CKMBINDEX", "TROPONINI" in the last 168 hours. CBG: Recent Labs  Lab 12/19/2021 1115 01/11/2022 1943 01/13/22 0003 01/13/22 0408 01/13/22 0738  GLUCAP 103* 122* 148* 179* 178*     Iron Studies: No results for input(s): "IRON", "TIBC", "TRANSFERRIN", "FERRITIN" in the last 72 hours. Studies/Results: DG Abd 1 View  Result Date: 01/13/2022 CLINICAL DATA:  Ileus. EXAM: ABDOMEN - 1 VIEW COMPARISON:  None Available. FINDINGS: The bowel gas pattern is normal. Nasogastric tube is looped within the stomach, with the distal tip in the expected position of the proximal stomach. No radio-opaque calculi or other significant radiographic abnormality are seen. IMPRESSION:  Nasogastric tube is looped within the stomach, with the distal tip in the expected position of the proximal stomach. No abnormal bowel dilatation is noted. Electronically Signed   By: Marijo Conception M.D.   On: 01/13/2022 08:26   DG Chest Port 1 View  Result Date: 01/13/2022 CLINICAL DATA:  Intra-aortic balloon catheter. EXAM: PORTABLE CHEST 1 VIEW COMPARISON:  January 12, 2022. FINDINGS: Stable cardiomegaly. Endotracheal and nasogastric tubes are unchanged in position. Bilateral internal jugular catheters are unchanged. Lungs are clear. Stable position of distal tip of aortic balloon catheter seen just below aortic knob. Bony thorax is unremarkable. IMPRESSION: Stable support apparatus.  No acute pulmonary disease is noted. Electronically Signed   By: Marijo Conception M.D.   On: 01/13/2022 08:24   DG Chest Port 1 View  Result Date: 12/20/2021 CLINICAL DATA:  Intra-aortic balloon pump assist EXAM: PORTABLE CHEST 1 VIEW COMPARISON:  Previous studies including the examination done earlier today FINDINGS: Transverse diameter of heart is increased. Central pulmonary vessels are slightly less prominent. There are no signs of alveolar pulmonary edema or focal pulmonary consolidation. There is  no pleural effusion or pneumothorax. Tip of endotracheal tube is 2.5 cm above the carina. Tip of right IJ central venous catheter is seen in superior vena cava. Tip of left IJ central venous catheter is seen in superior vena cava close to the right atrium. Tip of NG tube is seen in the stomach. There is a new small linear metallic density in the region of the proximal descending thoracic aorta close to the aortic arch consistent with the history of intra-aortic balloon pump. IMPRESSION: Cardiomegaly. There is decrease in pulmonary vascular congestion. There are no new infiltrates. Support devices as described in the body of the report. Electronically Signed   By: Elmer Picker M.D.   On: 12/21/2021 18:10   CARDIAC  CATHETERIZATION  Result Date: 01/07/2022 1. High right and left heart filling pressures with prominent V waves. 2. Pulmonary venous hypertension 3. High cardiac output in setting of ASD 4. Atrial level left => right shunt, shunt fraction 1.8/1 5. IABP placed 6. Swan removed as unable to maintain position in PA due to excessive pulsatility from severe TR.   DG Chest Port 1 View  Result Date: 01/10/2022 CLINICAL DATA:  Respiratory failure. EXAM: PORTABLE CHEST 1 VIEW COMPARISON:  January 11, 2022. FINDINGS: Stable cardiomegaly. Endotracheal and nasogastric tubes are unchanged in position. Bilateral internal jugular catheters are unchanged. No acute pulmonary abnormality is noted. Bony thorax is unremarkable. IMPRESSION: Stable support apparatus.  No acute pulmonary abnormality seen. Electronically Signed   By: Marijo Conception M.D.   On: 12/22/2021 08:17    Medications: Infusions:   prismasol BGK 4/2.5 400 mL/hr at 01/13/22 0315    prismasol BGK 4/2.5 400 mL/hr at 01/13/22 0315   sodium chloride Stopped (01/10/22 2245)   amiodarone 30 mg/hr (01/13/22 0900)   DOBUTamine 5 mcg/kg/min (01/13/22 0900)   feeding supplement (VITAL 1.5 CAL) 20 mL/hr at 01/13/22 0900   fentaNYL infusion INTRAVENOUS 25 mcg/hr (01/13/22 0900)   heparin 500 Units/hr (01/13/22 0900)   norepinephrine (LEVOPHED) Adult infusion 28 mcg/min (01/13/22 0900)   prismasol BGK 4/2.5 1,500 mL/hr at 01/13/22 0759   propofol (DIPRIVAN) infusion 15 mcg/kg/min (01/13/22 0900)   vasopressin Stopped (01/03/2022 2024)    Scheduled Medications:  allopurinol  50 mg Per Tube QPM   atorvastatin  80 mg Per Tube Daily   Chlorhexidine Gluconate Cloth  6 each Topical Daily   docusate  100 mg Per Tube BID   ezetimibe  10 mg Per Tube Daily   feeding supplement (PROSource TF20)  60 mL Per Tube Daily   insulin aspart  0-15 Units Subcutaneous Q4H   insulin glargine-yfgn  20 Units Subcutaneous QHS   multivitamin  1 tablet Oral QHS   mouth rinse  15  mL Mouth Rinse Q2H   pantoprazole (PROTONIX) IV  40 mg Intravenous Q12H   polyethylene glycol  17 g Per Tube Daily   senna-docusate  1 tablet Per Tube BID   sodium chloride flush  10-40 mL Intracatheter Q12H   sodium chloride flush  3 mL Intravenous Q12H    have reviewed scheduled and prn medications.  Physical Exam: General: Critically ill-looking female, intubated and sedated. Heart:RRR, s1s2 nl, CVP around 17. Lungs: Coarse breath sound bilateral Abdomen:soft, Non-tender, non-distended Extremities:No edema Dialysis Access: Right IJ temporary HD catheter in place  Stephanie Franco 01/13/2022,9:57 AM  LOS: 5 days

## 2022-01-14 ENCOUNTER — Inpatient Hospital Stay (HOSPITAL_COMMUNITY): Payer: Medicare Other

## 2022-01-14 ENCOUNTER — Ambulatory Visit: Payer: Medicare Other

## 2022-01-14 DIAGNOSIS — I340 Nonrheumatic mitral (valve) insufficiency: Secondary | ICD-10-CM | POA: Diagnosis not present

## 2022-01-14 LAB — GLUCOSE, CAPILLARY
Glucose-Capillary: 163 mg/dL — ABNORMAL HIGH (ref 70–99)
Glucose-Capillary: 166 mg/dL — ABNORMAL HIGH (ref 70–99)
Glucose-Capillary: 171 mg/dL — ABNORMAL HIGH (ref 70–99)
Glucose-Capillary: 178 mg/dL — ABNORMAL HIGH (ref 70–99)
Glucose-Capillary: 192 mg/dL — ABNORMAL HIGH (ref 70–99)
Glucose-Capillary: 195 mg/dL — ABNORMAL HIGH (ref 70–99)
Glucose-Capillary: 199 mg/dL — ABNORMAL HIGH (ref 70–99)

## 2022-01-14 LAB — RENAL FUNCTION PANEL
Albumin: 2.7 g/dL — ABNORMAL LOW (ref 3.5–5.0)
Albumin: 2.6 g/dL — ABNORMAL LOW (ref 3.5–5.0)
Anion gap: 12 (ref 5–15)
Anion gap: 11 (ref 5–15)
BUN: 16 mg/dL (ref 8–23)
BUN: 16 mg/dL (ref 8–23)
CO2: 25 mmol/L (ref 22–32)
CO2: 26 mmol/L (ref 22–32)
Calcium: 9.2 mg/dL (ref 8.9–10.3)
Calcium: 9.3 mg/dL (ref 8.9–10.3)
Chloride: 99 mmol/L (ref 98–111)
Chloride: 100 mmol/L (ref 98–111)
Creatinine, Ser: 1.27 mg/dL — ABNORMAL HIGH (ref 0.44–1.00)
Creatinine, Ser: 1.39 mg/dL — ABNORMAL HIGH (ref 0.44–1.00)
GFR, Estimated: 45 mL/min — ABNORMAL LOW (ref >60)
GFR, Estimated: 40 mL/min — ABNORMAL LOW (ref >60)
Glucose, Bld: 184 mg/dL — ABNORMAL HIGH (ref 70–99)
Glucose, Bld: 194 mg/dL — ABNORMAL HIGH (ref 70–99)
Phosphorus: 2.8 mg/dL (ref 2.5–4.6)
Phosphorus: 1.7 mg/dL — ABNORMAL LOW (ref 2.5–4.6)
Potassium: 4.7 mmol/L (ref 3.5–5.1)
Potassium: 4.8 mmol/L (ref 3.5–5.1)
Sodium: 136 mmol/L (ref 135–145)
Sodium: 137 mmol/L (ref 135–145)

## 2022-01-14 LAB — MAGNESIUM: Magnesium: 2.6 mg/dL — ABNORMAL HIGH (ref 1.7–2.4)

## 2022-01-14 LAB — CBC
HCT: 27.1 % — ABNORMAL LOW (ref 36.0–46.0)
Hemoglobin: 8.3 g/dL — ABNORMAL LOW (ref 12.0–15.0)
MCH: 26.4 pg (ref 26.0–34.0)
MCHC: 30.6 g/dL (ref 30.0–36.0)
MCV: 86.3 fL (ref 80.0–100.0)
Platelets: 159 10*3/uL (ref 150–400)
RBC: 3.14 MIL/uL — ABNORMAL LOW (ref 3.87–5.11)
RDW: 20 % — ABNORMAL HIGH (ref 11.5–15.5)
WBC: 10.3 10*3/uL (ref 4.0–10.5)
nRBC: 2 % — ABNORMAL HIGH (ref 0.0–0.2)

## 2022-01-14 LAB — COOXEMETRY PANEL
Carboxyhemoglobin: 1.7 % — ABNORMAL HIGH (ref 0.5–1.5)
Methemoglobin: <0.7 % (ref 0.0–1.5)
O2 Saturation: 80.7 %
Total hemoglobin: 8.2 g/dL — ABNORMAL LOW (ref 12.0–16.0)

## 2022-01-14 LAB — TRIGLYCERIDES: Triglycerides: 61 mg/dL (ref <150)

## 2022-01-14 LAB — HEPARIN LEVEL (UNFRACTIONATED): Heparin Unfractionated: 0.74 IU/mL — ABNORMAL HIGH (ref 0.30–0.70)

## 2022-01-14 LAB — APTT: aPTT: 76 seconds — ABNORMAL HIGH (ref 24–36)

## 2022-01-14 NOTE — Progress Notes (Signed)
NAME:  Stephanie Franco, MRN:  [id number], DOB:  1950/12/24, LOS: 6 ADMISSION DATE:  12/16/2021, CONSULTATION DATE:  01/10/22 REFERRING MD:  Debara Pickett, CHIEF COMPLAINT:  SOB   History of Present Illness:  71 year old woman with hx of afib on eliquis, NICM, CKD, DM2, HLD, HTN p/w symptomatic sevre mitral regurgitation manifested by DOE.  She had attempted mitral clipping via atrial septostomy on 01/09/2022 that was aborted due to inability to cross septum.  Subsequently has developed worsening oliguria and today more somnolence.  Initially PCCM consulted for central access but patient has deteriorated further with brief cardiac arrest due to hypoxemia requiring intubation and transfer to ICU.   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   Significant Hospital Events: Including procedures, antibiotic start and stop dates in addition to other pertinent events   8/24 admit, failed mitraclip 8/25 worsening BPs, worsening renal function 8/26 PCCM consult, central line placed, brief code, transfer to ICU intubated, started on CVVH 8/28 RHC and balloon pump placed  Interim History / Subjective:  Remains on CRRT and iNO. IABP at 1:1. Attempting to wean NO and vent today.  Objective   Blood pressure (!) 117/47, pulse 95, temperature (!) 97 F  (36.1 C), resp. rate 16, height '5\' 3"'$  (1.6 m), weight 71.5 kg, SpO2 100 %. CVP:  [5 mmHg-19 mmHg] 8 mmHg  Vent Mode: PRVC FiO2 (%):  [40 %-50 %] 40 % Set Rate:  [18 bmp] 18 bmp Vt Set:  [420 mL] 420 mL PEEP:  [5 cmH20] 5 cmH20 Plateau Pressure:  [13 cmH20-19 cmH20] 13 cmH20   Intake/Output Summary (Last 24 hours) at 01/14/2022 0821 Last data filed at 01/14/2022 0800 Gross per 24 hour  Intake 3462.76 ml  Output 6515 ml  Net -3052.24 ml    Filed Weights   12/25/2021 0500 01/13/22 0500 01/14/22 0445  Weight: 76 kg 73.8 kg 71.5 kg    Examination: General: Adult female, resting in bed, in NAD. Neuro: Sedated but will open eyes to voice and noxious stimuli. HEENT: Chunky/AT. Sclerae anicteric. ETT in place. Cardiovascular: IRIR, no M/R/G. IABP in place at 1:1. Lungs: Respirations even and unlabored.  CTA bilaterally, No W/R/R. Abdomen: BS x 4, soft, NT/ND.  Musculoskeletal: No gross deformities, no edema.  Skin: Intact, warm, no rashes.  Assessment & Plan:   Cardiogenic shock - 2/2 Acute on chronic systolic heart failure with ischemic cardiomyopathy Atrial fibrillation, severe MR - s/p failed MitraClip procedure Continue volume removal with CVVH. Continue vasopressors and inotropes per CHF team, weaning as able.  Plan is to ultimately get her to the the point of re-attempting mitraclip. Continue amiodarone and heparin.  Possible sepsis of unclear etiology. Continue empiric Cefepime for now (8/31 will be 5 days). Follow cultures.  Severe mixed venous, arterial pulmonary hypertension PVR 11 on prior cardiac cath with elevated right and left heart filling pressures Wean iNO per protocol and attempt to get off. Continue volume removal per CVVH.  Acute hypoxic respiratory failure. Weaning iNO as above. Wean  vent as able in attempt to try SBT. Bronchial hygiene. CXR intermittently.  AKI on chronic kidney disease. On CVVH, continue goal remain net neg. Nephrology  following.  Husband updated at bedside.  Best Practice (right click and "Reselect all SmartList Selections" daily)   Diet/type: tubefeeds and NPO DVT prophylaxis: systemic heparin GI prophylaxis: PPI Lines: Central line Foley:  Yes, and it is still needed Code Status:  full code - husband and son requested change from DNR to full code while trying to stabilize her Last date of multidisciplinary goals of care discussion [pending]  Critical care time: 30 min.    Montey Hora, [facility] For pager details, please see AMION or use Epic chat  After 1900, please call [facility] for cross coverage needs 01/14/2022, 8:21 AM

## 2022-01-14 NOTE — Progress Notes (Signed)
[facility] NEPHROLOGY PROGRESS NOTE  Assessment/ Plan: Pt is a 71 y.o. yo female  w/ hx of anemia, breast cancer, CAD, HFrEF, CKD 3, DM2, gout, HL, HTN, morbid obesity, OSA, pHTN, severe mitral regurgitation w/ LVEF 20-25% who presented on 8/24 for planned TEE for the MR.  Patient became hypoxic, had PEA arrest, intubated, developed AKI and transferred to ICU.  #Acute kidney injury on CKD stage IV: b/l cr arounf 1.7-2.4.  AKI due to cardiac arrest/CHF.  Moved to ICU for inotropes including dobutamine and vasopressors.  Started CRRT on 8/26 and has been tolerating well. Remains anuric without sign of renal recovery.  Tolerating UF with CRRT and CVP around 12-13.  Continue CRRT today. All 4K bath, IV heparin for anticoagulation.  UF around 100-150 cc an hour.  #Acute on chronic systolic CHF/cardiogenic shock: Currently on dobutamine, Levophed.  UF/volume management with CRRT.  Heart failure team is following.  #A-fib with RVR: On amiodarone and anticoagulation.  #VDRF/acute hypoxic respiratory failure: On mechanical ventilation.  Per pulmonary team.  #Anemia: Monitor hemoglobin.  Updated the patient's husband and son at the bedside.  Discussed with the cardiology team as well.  Subjective: Seen and examined in ICU.  No issue with dialysis.  Tolerating UF.  Family members at the bedside.  No urine output recorded. Objective Vital signs in last 24 hours: Vitals:   01/14/22 0700 01/14/22 0800 01/14/22 0801 01/14/22 0900  BP: (!) 115/49  (!) 117/47   Pulse: (!) 186 95 95 (!) 102  Resp: 12 (!) '21 16 14  '$ Temp: (!) 97 F (36.1 C) (!) 97.3 F (36.3 C)  98.4 F (36.9 C)  TempSrc:  Esophageal    SpO2: 100% 100% 100% 100%  Weight:      Height:       Weight change: -2.3 kg  Intake/Output Summary (Last 24 hours) at 01/14/2022 1002 Last data filed at 01/14/2022 0900 Gross per 24 hour  Intake 3156.23 ml  Output 6187 ml  Net -3030.77 ml        Labs: RENAL PANEL Recent  Labs    08/08/21 0830 08/11/21 1415 09/18/21 1808 09/18/21 2103 09/19/21 0427 09/19/21 0921 09/20/21 0436 09/20/21 0827 10/02/21 1324 10/07/21 1513 11/26/21 0913 12/01/21 1531 01/05/22 1040 01/09/22 0034 01/10/22 0106 01/10/22 1744 01/11/22 0357 01/11/22 1610 01/15/2022 0414 01/13/2022 0416 01/13/2022 1600 01/10/2022 1601 01/11/2022 1602 01/07/2022 1605 01/11/2022 1616 12/29/2021 1642 12/18/2021 1852 01/13/22 0442 01/13/22 1648 01/14/22 0338  NA 142   < >  --    < >  --    < >  --    < > 137   < > 137   < > 135 137 135   < > 135 135  --  '136 136 136 136 136 135 136 135 135 137 '$ 136  K 3.2*   < > <2.0*   < >  --    < >  --    < > 4.7   < > 3.3*   < > 3.6 3.8 4.9   < > 4.2 4.4  --  4.3 4.5 4.5 4.5 4.6 4.5 4.5 4.3 3.9 4.3 4.7  CL 99   < >  --    < >  --    < >  --    < > 95*   < > 91*   < > 93* 106 99  --  99 100  --  104  --   --   --   --   --   --  102 100 103 99  CO2 27   < >  --    < >  --    < >  --    < > 31   < > 33*   < > '29 24 23  '$ --  23 24  --  24  --   --   --   --   --   --  '22 26 25 25  '$ GLUCOSE 105*   < >  --    < >  --    < >  --    < > 152*   < > 139*   < > 108* 153* 131*  --  133* 93  --  109*  --   --   --   --   --   --  142* 191* 175* 184*  BUN 18   < >  --    < >  --    < >  --    < > 26*   < > 51*   < > 45* 44* 52*  --  39* 24*  --  18  --   --   --   --   --   --  '18 19 18 16  '$ CREATININE 2.17*   < >  --    < >  --    < >  --    < > 2.08*   < > 1.94*   < > 2.44* 2.47* 3.60*  --  2.88* 2.03*  --  1.54*  --   --   --   --   --   --  1.75* 1.51* 1.30* 1.27*  CALCIUM 10.7*   < >  --    < >  --    < >  --    < > 10.3   < > 10.9*   < > 9.6 8.4* 8.6*  --  8.3* 8.2*  --  8.0*  --   --   --   --   --   --  8.4* 8.2* 8.7* 9.2  MG 1.9  --  1.9  --  2.2  --  2.2  --   --   --   --   --   --   --   --   --  1.9  --   --  2.5*  --   --   --   --   --   --  2.6* 2.6* 2.8* 2.6*  PHOS  --   --   --   --  3.3  --   --   --   --   --   --   --   --   --   --   --  3.9 3.1 2.9  --   --   --   --    --   --   --  3.9  3.9 2.3* 1.9*  1.9* 2.8  ALBUMIN 3.5   < >  --   --  2.4*  --   --    < > 3.6  --  4.3  --  3.4*  --   --   --  2.9* 2.8*  --  2.8*  --   --   --   --   --   --  2.6* 2.5* 2.6* 2.7*   < > = values in this interval not displayed.  Liver Function Tests: Recent Labs  Lab 01/03/2022 0416 12/17/2021 1852 01/13/22 0442 01/13/22 1648 01/14/22 0338  AST 154*  --   --   --   --   ALT 86*  --   --   --   --   ALKPHOS 74  --   --   --   --   BILITOT 1.0  --   --   --   --   PROT 6.8  --   --   --   --   ALBUMIN 2.8*   < > 2.5* 2.6* 2.7*   < > = values in this interval not displayed.    No results for input(s): "LIPASE", "AMYLASE" in the last 168 hours. No results for input(s): "AMMONIA" in the last 168 hours. CBC: Recent Labs    09/25/21 0835 09/26/21 0214 01/09/2022 1616 01/06/2022 1642 01/03/2022 1945 01/13/22 0442 01/14/22 0338  HGB  --    < > 8.8* 9.5* 7.9* 7.9* 8.3*  MCV  --    < >  --   --  86.3 87.8 86.3  FERRITIN 442*  --   --   --   --   --   --   TIBC 319  --   --   --   --   --   --   IRON 29  --   --   --   --   --   --    < > = values in this interval not displayed.     Cardiac Enzymes: No results for input(s): "CKTOTAL", "CKMB", "CKMBINDEX", "TROPONINI" in the last 168 hours. CBG: Recent Labs  Lab 01/13/22 1647 01/13/22 1932 01/14/22 0005 01/14/22 0342 01/14/22 0726  GLUCAP 161* 179* 163* 178* 166*     Iron Studies: No results for input(s): "IRON", "TIBC", "TRANSFERRIN", "FERRITIN" in the last 72 hours. Studies/Results: DG CHEST PORT 1 VIEW  Result Date: 01/14/2022 CLINICAL DATA:  Endotracheal tube EXAM: PORTABLE CHEST 1 VIEW COMPARISON:  None Available. FINDINGS: Endotracheal to 5.7 cm from carina in good position. Two central venous lines unchanged. Tip of aortic balloon pump unchanged. Stable enlarged cardiac silhouette. No pulmonary edema. No pneumothorax IMPRESSION: 1. Stable support apparatus. 2. Stable marked cardiomegaly.  No  interval change. Electronically Signed   By: Suzy Bouchard M.D.   On: 01/14/2022 08:08   DG Abd 1 View  Result Date: 01/13/2022 CLINICAL DATA:  Ileus. EXAM: ABDOMEN - 1 VIEW COMPARISON:  None Available. FINDINGS: The bowel gas pattern is normal. Nasogastric tube is looped within the stomach, with the distal tip in the expected position of the proximal stomach. No radio-opaque calculi or other significant radiographic abnormality are seen. IMPRESSION: Nasogastric tube is looped within the stomach, with the distal tip in the expected position of the proximal stomach. No abnormal bowel dilatation is noted. Electronically Signed   By: Marijo Conception M.D.   On: 01/13/2022 08:26   DG Chest Port 1 View  Result Date: 01/13/2022 CLINICAL DATA:  Intra-aortic balloon catheter. EXAM: PORTABLE CHEST 1 VIEW COMPARISON:  January 12, 2022. FINDINGS: Stable cardiomegaly. Endotracheal and nasogastric tubes are unchanged in position. Bilateral internal jugular catheters are unchanged. Lungs are clear. Stable position of distal tip of aortic balloon catheter seen just below aortic knob. Bony thorax is unremarkable. IMPRESSION: Stable support apparatus.  No acute pulmonary disease is noted. Electronically Signed   By: Marijo Conception M.D.   On: 01/13/2022 08:24   DG Chest  Port 1 View  Result Date: 12/17/2021 CLINICAL DATA:  Intra-aortic balloon pump assist EXAM: PORTABLE CHEST 1 VIEW COMPARISON:  Previous studies including the examination done earlier today FINDINGS: Transverse diameter of heart is increased. Central pulmonary vessels are slightly less prominent. There are no signs of alveolar pulmonary edema or focal pulmonary consolidation. There is no pleural effusion or pneumothorax. Tip of endotracheal tube is 2.5 cm above the carina. Tip of right IJ central venous catheter is seen in superior vena cava. Tip of left IJ central venous catheter is seen in superior vena cava close to the right atrium. Tip of NG tube is  seen in the stomach. There is a new small linear metallic density in the region of the proximal descending thoracic aorta close to the aortic arch consistent with the history of intra-aortic balloon pump. IMPRESSION: Cardiomegaly. There is decrease in pulmonary vascular congestion. There are no new infiltrates. Support devices as described in the body of the report. Electronically Signed   By: Elmer Picker M.D.   On: 01/13/2022 18:10   CARDIAC CATHETERIZATION  Result Date: 01/10/2022 1. High right and left heart filling pressures with prominent V waves. 2. Pulmonary venous hypertension 3. High cardiac output in setting of ASD 4. Atrial level left => right shunt, shunt fraction 1.8/1 5. IABP placed 6. Swan removed as unable to maintain position in PA due to excessive pulsatility from severe TR.    Medications: Infusions:   prismasol BGK 4/2.5 400 mL/hr at 01/14/22 0403    prismasol BGK 4/2.5 400 mL/hr at 01/14/22 0403   sodium chloride Stopped (01/10/22 2245)   amiodarone 30 mg/hr (01/14/22 0900)   DOBUTamine 5 mcg/kg/min (01/14/22 0900)   feeding supplement (VITAL 1.5 CAL) 45 mL/hr at 01/14/22 0900   fentaNYL infusion INTRAVENOUS 100 mcg/hr (01/14/22 0900)   heparin 500 Units/hr (01/14/22 0900)   norepinephrine (LEVOPHED) Adult infusion 20 mcg/min (01/14/22 0900)   prismasol BGK 4/2.5 1,500 mL/hr at 01/14/22 0729   propofol (DIPRIVAN) infusion 15 mcg/kg/min (01/14/22 0900)   vasopressin Stopped (01/04/2022 2024)    Scheduled Medications:  allopurinol  50 mg Per Tube QPM   atorvastatin  80 mg Per Tube Daily   Chlorhexidine Gluconate Cloth  6 each Topical Daily   docusate  100 mg Per Tube BID   ezetimibe  10 mg Per Tube Daily   feeding supplement (PROSource TF20)  60 mL Per Tube Daily   insulin aspart  0-15 Units Subcutaneous Q4H   insulin glargine-yfgn  20 Units Subcutaneous QHS   multivitamin  1 tablet Oral QHS   mouth rinse  15 mL Mouth Rinse Q2H   pantoprazole (PROTONIX) IV   40 mg Intravenous Q12H   polyethylene glycol  17 g Per Tube Daily   senna-docusate  1 tablet Per Tube BID   sodium chloride flush  10-40 mL Intracatheter Q12H   sodium chloride flush  3 mL Intravenous Q12H    have reviewed scheduled and prn medications.  Physical Exam: General: Critically ill-looking female, intubated and sedated. Heart:RRR, s1s2 nl, CVP around 13. Lungs: Coarse breath sound bilateral Abdomen:soft, Non-tender, non-distended Extremities:No edema Dialysis Access: Right IJ temporary HD catheter in place  Adrine Hayworth Prasad Ciera Beckum 01/14/2022,10:02 AM  LOS: 6 days

## 2022-01-14 NOTE — Progress Notes (Addendum)
Patient ID: Stephanie Franco, female   DOB: 11/14/1950, 71 y.o.   MRN: [id number]   Advanced Heart Failure Rounding Note  PCP-Cardiologist: Glenetta Hew, MD   Subjective:    08/27: Milrinone switched to DBA d/t pressor requirement 08/28: RHC + IABP placement. Unable to keep Swan in position due to pulsatility of heart with severe TR/MR so removed.   Continues on 5 DBA, 22 NE, NO 20 ppm. IABP 1:1, good position on today's CXR.  CO-OX 81%. MAP stable.   I/Os net negative yesterday pulling UF via CVVH 100-150 cc/hr, CVP down to 13.   Intubated and sedated, on empiric cefepime.  Afebrile.   Remains in AF 80s-90s on heparin gtt and amiodarone 30.   Heart Pressures RHC Procedural Findings: Hemodynamics (mmHg) RA mean 22, V wave to 32 RV 74/18 PA 78/33, mean 51 PCWP mean 28, V wave to 38  Oxygen saturations: IVC 73%  RA 85% RV 85% PA 85% AO 100%  Cardiac Output (Fick) 13 (high output with shunt) Cardiac Index (Fick) 7.7  PVR < 2 WU  Shunt fraction 1.8/1    Objective:   Weight Range: 71.5 kg Body mass index is 27.92 kg/m.   Vital Signs:   Temp:  [95.4 F (35.2 C)-98.6 F (37 C)] 97 F (36.1 C) (08/30 0700) Pulse Rate:  [107-242] 186 (08/30 0700) Resp:  [11-31] 12 (08/30 0700) BP: (101-120)/(31-54) 115/49 (08/30 0700) SpO2:  [95 %-100 %] 100 % (08/30 0700) Arterial Line BP: (96-129)/(31-59) 115/52 (08/30 0700) FiO2 (%):  [50 %] 50 % (08/30 0352) Weight:  [71.5 kg] 71.5 kg (08/30 0445) Last BM Date : 01/07/22  Weight change: Filed Weights   12/21/2021 0500 01/13/22 0500 01/14/22 0445  Weight: 76 kg 73.8 kg 71.5 kg    Intake/Output:   Intake/Output Summary (Last 24 hours) at 01/14/2022 0818 Last data filed at 01/14/2022 0800 Gross per 24 hour  Intake 3462.76 ml  Output 6515 ml  Net -3052.24 ml      Physical Exam    General: Intubated/sedated.  Neck: Thick, JVP difficult, no thyromegaly or thyroid nodule.  Lungs: Clear to auscultation bilaterally with  normal respiratory effort. CV: Nondisplaced PMI.  Heart regular S1/S2, no S3/S4, 3/6 HSM LLSB/apex.  Trace ankle edema.  Abdomen: Soft, nontender, no hepatosplenomegaly, no distention.  Skin: Intact without lesions or rashes.  Neurologic: Wakes up/follows commands Extremities: No clubbing or cyanosis.  HEENT: Normal.    Telemetry   Afib 90s-100s, 10-15 PVCs/min  Labs    CBC Recent Labs    01/13/22 0442 01/14/22 0338  WBC 11.4* 10.3  HGB 7.9* 8.3*  HCT 25.9* 27.1*  MCV 87.8 86.3  PLT 182 443   Basic Metabolic Panel Recent Labs    01/13/22 1648 01/14/22 0338  NA 137 136  K 4.3 4.7  CL 103 99  CO2 25 25  GLUCOSE 175* 184*  BUN 18 16  CREATININE 1.30* 1.27*  CALCIUM 8.7* 9.2  MG 2.8* 2.6*  PHOS 1.9*  1.9* 2.8   Liver Function Tests Recent Labs    01/10/2022 0416 01/05/2022 1852 01/13/22 1648 01/14/22 0338  AST 154*  --   --   --   ALT 86*  --   --   --   ALKPHOS 74  --   --   --   BILITOT 1.0  --   --   --   PROT 6.8  --   --   --   ALBUMIN 2.8*   < >  2.6* 2.7*   < > = values in this interval not displayed.   No results for input(s): "LIPASE", "AMYLASE" in the last 72 hours. Cardiac Enzymes No results for input(s): "CKTOTAL", "CKMB", "CKMBINDEX", "TROPONINI" in the last 72 hours.  BNP: BNP (last 3 results) Recent Labs    10/07/21 1513 11/12/21 1442 12/01/21 1531  BNP 71.8 91.4 104.2*    ProBNP (last 3 results) No results for input(s): "PROBNP" in the last 8760 hours.   D-Dimer No results for input(s): "DDIMER" in the last 72 hours. Hemoglobin A1C No results for input(s): "HGBA1C" in the last 72 hours. Fasting Lipid Panel Recent Labs    01/14/22 0338  TRIG 61   Thyroid Function Tests No results for input(s): "TSH", "T4TOTAL", "T3FREE", "THYROIDAB" in the last 72 hours.  Invalid input(s): "FREET3"  Other results:   Imaging    DG CHEST PORT 1 VIEW  Result Date: 01/14/2022 CLINICAL DATA:  Endotracheal tube EXAM: PORTABLE CHEST 1  VIEW COMPARISON:  None Available. FINDINGS: Endotracheal to 5.7 cm from carina in good position. Two central venous lines unchanged. Tip of aortic balloon pump unchanged. Stable enlarged cardiac silhouette. No pulmonary edema. No pneumothorax IMPRESSION: 1. Stable support apparatus. 2. Stable marked cardiomegaly.  No interval change. Electronically Signed   By: Suzy Bouchard M.D.   On: 01/14/2022 08:08     Medications:     Scheduled Medications:  allopurinol  50 mg Per Tube QPM   atorvastatin  80 mg Per Tube Daily   Chlorhexidine Gluconate Cloth  6 each Topical Daily   docusate  100 mg Per Tube BID   ezetimibe  10 mg Per Tube Daily   feeding supplement (PROSource TF20)  60 mL Per Tube Daily   insulin aspart  0-15 Units Subcutaneous Q4H   insulin glargine-yfgn  20 Units Subcutaneous QHS   multivitamin  1 tablet Oral QHS   mouth rinse  15 mL Mouth Rinse Q2H   pantoprazole (PROTONIX) IV  40 mg Intravenous Q12H   polyethylene glycol  17 g Per Tube Daily   senna-docusate  1 tablet Per Tube BID   sodium chloride flush  10-40 mL Intracatheter Q12H   sodium chloride flush  3 mL Intravenous Q12H    Infusions:   prismasol BGK 4/2.5 400 mL/hr at 01/14/22 0403    prismasol BGK 4/2.5 400 mL/hr at 01/14/22 0403   sodium chloride Stopped (01/10/22 2245)   amiodarone 30 mg/hr (01/14/22 0800)   DOBUTamine 5 mcg/kg/min (01/14/22 0800)   feeding supplement (VITAL 1.5 CAL) 45 mL/hr at 01/14/22 0800   fentaNYL infusion INTRAVENOUS 100 mcg/hr (01/14/22 0800)   heparin 500 Units/hr (01/14/22 0800)   norepinephrine (LEVOPHED) Adult infusion 22 mcg/min (01/14/22 0800)   prismasol BGK 4/2.5 1,500 mL/hr at 01/14/22 0403   propofol (DIPRIVAN) infusion 15 mcg/kg/min (01/14/22 0800)   vasopressin Stopped (01/07/2022 2024)    PRN Medications: sodium chloride, acetaminophen, albuterol, alteplase, fentaNYL (SUBLIMAZE) injection, heparin, LORazepam, naphazoline-glycerin, ondansetron (ZOFRAN) IV, mouth rinse,  oxyCODONE, sodium chloride, sodium chloride flush, sodium chloride flush     Assessment/Plan   1. Acute on chronic systolic CHF => cardiogenic shock: Mixed ischemic/nonischemic cardiomyopathy. RHC in 2/23 showed elevated R>L heart filling pressures with pulmonary arterial hypertension and low CI 1.89. Cardiac MRI w/ myocardial LGE in septum is in a noncoronary pattern, ? myocarditis, anterior wall LGE is subendocardial and may be due to prior MI => ?mixed ischemic/nonischemic cardiomyopathy. TEE (6/23) with EF 30-35%, moderate RV dysfunction, severe MR.  She had attempted Mitraclip placment this admission with failure, now with cardiogenic shock, AKI, and marked volume overload.  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 Intubated on pressors with CVVH running.  IABP in place, did not place Impella as re-attempted Mitraclip procedure, which is probably our only good option at this time to improve her function, would require manipulation of wire across the LA to LV (discussed with Dr. Ali Lowe).   She remains NE 22, dobutamine 5 and off vasopressin.  IABP 1:1, good position on CXR.  Co-ox 81% (in setting of L=>R shunt. Net negative 3057 cc pulling 100 cc/hr net UF via CVVH, CVP 13 today.  We are trying to stabilize her for repeat Mitraclip + iatrogenic ASD closure.  - Continue to wean NE as able.  - Continue CVVH, pull 100 cc/hr net negative UF.   - Prognosis is guarded.  She has metastatic breast cancer, not candidate for LVAD.  She has severe MR and TR with biventricular dysfunction (ASD also likely plays at least a small role in decompensation).  Failed Mitraclip earlier this admission. Think our only possible option would be to get her stable enough to re-attempt Mitraclip + iatrogenic ASD closure though this  may be a long shot.  2. Atrial fibrillation: This is permanent, x years. Rate 80s-90s + frequent PVCs (10-15/min).  - Continue amiodarone gtt '30mg'$ /hr.  - Heparin gtt for anticoagulation.  3. PVCs: Frequent during 2/23 admission but was also on milrinone. ? Contributing to cardiomyopathy.   - Amiodarone gtt. 4. Mitral regurgitation:  TEE (6/23) with severe MR with restricted posterior leaflet in setting of dilation of the LA and LV. mTEER attempted on 8/24 but failed.  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  As above, will try to stabilize her enough to consider repeat Mitraclip though not sure how feasible this will be.  5. AKI on CKD stage 3: AKI in setting of cardiogenic shock, now on CVVH.  - As above, aim for net negative UF up to 100 cc/hr today.  6. Pulmonary hypertension: RHC this admission with pulmonary venous hypertension.  7. CAD: Cath 2/23 with prox RCA to Mid RCA lesion 60% stenosis, distal LCx lesion 90% stenosis (similar to the past).  Medical management. Not clear that CAD can explain her cardiomyopathy.  - Continue statin/Zetia  - No ASA given anticoagulation.   8. Acute hypoxemic respiratory failure: Intubated in setting of pulmonary edema.  - Wean NO - Attempt to wean vent/extubate today.  9. ID:  Empirically covering with cefepime for possible sepsis.  10. 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  Qp/Qs 1.8 on RHC, will need closure with re-attempt at Mitraclip.  11. Full code for now.    Length of Stay: 6  CRITICAL CARE Performed by: Loralie Champagne  Total critical care time: 40 minutes  Critical care time was exclusive of separately billable procedures and treating other patients.  Critical care was necessary to treat or prevent imminent or life-threatening deterioration.  Critical care was time spent personally by me on the following activities: development of treatment plan with patient and/or surrogate  as well as nursing, discussions with consultants, evaluation of patient's response to treatment, examination of patient, obtaining history from patient or surrogate, ordering and performing treatments and interventions,  ordering and review of laboratory studies, ordering and review of radiographic studies, pulse oximetry and re-evaluation of patient's condition.   Loralie Champagne. 01/14/2022  Advanced Heart Failure Team Pager [phone] (M-F; 7a - 5p)  Please contact [facility] for night-coverage after hours (5p -7a ) and weekends on [email or web address]

## 2022-01-14 NOTE — Progress Notes (Signed)
[place] for heparin Indication: atrial fibrillation  No Known Allergies  Patient Measurements: Height: '5\' 3"'$  (160 cm) Weight: 71.5 kg (157 lb 10.1 oz) IBW/kg (Calculated) : 52.4 Heparin Dosing Weight: 81kg  Vital Signs: Temp: 99.5 F (37.5 C) (08/30 1000) Temp Source: Esophageal (08/30 0800) BP: 112/41 (08/30 1100) Pulse Rate: 113 (08/30 1100)  Labs: Recent Labs    12/21/2021 0416 01/11/2022 1600 12/27/2021 1945 01/13/22 0221 01/13/22 0442 01/13/22 1501 01/13/22 1648 01/14/22 0338  HGB 8.5*   < > 7.9*  --  7.9*  --   --  8.3*  HCT 27.8*   < > 25.9*  --  25.9*  --   --  27.1*  PLT 219  --  186  --  182  --   --  159  APTT 124*  --   --  70*  --  79*  --  76*  HEPARINUNFRC >1.10*  --   --  >1.10*  --   --   --  0.74*  CREATININE 1.54*   < >  --   --  1.51*  --  1.30* 1.27*   < > = values in this interval not displayed.     Estimated Creatinine Clearance: 37.9 mL/min (A) (by C-G formula based on SCr of 1.27 mg/dL (H)).   Assessment: 30 yoF s/p unsuccessful TEER for mitral valve. Pt on apixaban PTA for hx AFib, now s/p brief cardiac arrest and pharmacy asked to transition to IV heparin. Last apixaban dose was 8/26 1050.  S/p placement of IABP on 8/28, will lower heparin/aPTT goals.  aPTT this AM is within goal range at 76 seconds. Heparin level falsely elevated as expected s/p recent apixaban.  No overt bleeding or complications noted.  Goal of Therapy:  Heparin level 0.2-0.5 units/ml aPTT 56-85 seconds Monitor platelets by anticoagulation protocol: Yes   Plan:  Continue IV heparin at 500 units/hr. Continue daily heparin level, aPTT and CBC.   [place] Crane, Roylene Reason, [facility] Clinical Pharmacist  01/14/2022 11:27 AM   [facility] pharmacy phone numbers are listed on [email or web address]

## 2022-01-15 ENCOUNTER — Inpatient Hospital Stay (HOSPITAL_COMMUNITY): Payer: Medicare Other

## 2022-01-15 DIAGNOSIS — I340 Nonrheumatic mitral (valve) insufficiency: Secondary | ICD-10-CM | POA: Diagnosis not present

## 2022-01-15 LAB — RENAL FUNCTION PANEL
Albumin: 2.5 g/dL — ABNORMAL LOW (ref 3.5–5.0)
Albumin: 2.5 g/dL — ABNORMAL LOW (ref 3.5–5.0)
Anion gap: 9 (ref 5–15)
Anion gap: 8 (ref 5–15)
BUN: 19 mg/dL (ref 8–23)
BUN: 21 mg/dL (ref 8–23)
CO2: 26 mmol/L (ref 22–32)
CO2: 26 mmol/L (ref 22–32)
Calcium: 9.5 mg/dL (ref 8.9–10.3)
Calcium: 9.4 mg/dL (ref 8.9–10.3)
Chloride: 102 mmol/L (ref 98–111)
Chloride: 104 mmol/L (ref 98–111)
Creatinine, Ser: 1.34 mg/dL — ABNORMAL HIGH (ref 0.44–1.00)
Creatinine, Ser: 1.22 mg/dL — ABNORMAL HIGH (ref 0.44–1.00)
GFR, Estimated: 42 mL/min — ABNORMAL LOW (ref >60)
GFR, Estimated: 47 mL/min — ABNORMAL LOW (ref >60)
Glucose, Bld: 187 mg/dL — ABNORMAL HIGH (ref 70–99)
Glucose, Bld: 134 mg/dL — ABNORMAL HIGH (ref 70–99)
Phosphorus: 1.7 mg/dL — ABNORMAL LOW (ref 2.5–4.6)
Phosphorus: 1.5 mg/dL — ABNORMAL LOW (ref 2.5–4.6)
Potassium: 4.9 mmol/L (ref 3.5–5.1)
Potassium: 5.2 mmol/L — ABNORMAL HIGH (ref 3.5–5.1)
Sodium: 137 mmol/L (ref 135–145)
Sodium: 138 mmol/L (ref 135–145)

## 2022-01-15 LAB — CBC
HCT: 27.4 % — ABNORMAL LOW (ref 36.0–46.0)
Hemoglobin: 8.4 g/dL — ABNORMAL LOW (ref 12.0–15.0)
MCH: 26.3 pg (ref 26.0–34.0)
MCHC: 30.7 g/dL (ref 30.0–36.0)
MCV: 85.6 fL (ref 80.0–100.0)
Platelets: 141 10*3/uL — ABNORMAL LOW (ref 150–400)
RBC: 3.2 MIL/uL — ABNORMAL LOW (ref 3.87–5.11)
RDW: 20 % — ABNORMAL HIGH (ref 11.5–15.5)
WBC: 11.4 10*3/uL — ABNORMAL HIGH (ref 4.0–10.5)
nRBC: 0.9 % — ABNORMAL HIGH (ref 0.0–0.2)

## 2022-01-15 LAB — GLUCOSE, CAPILLARY
Glucose-Capillary: 201 mg/dL — ABNORMAL HIGH (ref 70–99)
Glucose-Capillary: 221 mg/dL — ABNORMAL HIGH (ref 70–99)
Glucose-Capillary: 221 mg/dL — ABNORMAL HIGH (ref 70–99)
Glucose-Capillary: 126 mg/dL — ABNORMAL HIGH (ref 70–99)
Glucose-Capillary: 133 mg/dL — ABNORMAL HIGH (ref 70–99)

## 2022-01-15 LAB — MAGNESIUM: Magnesium: 2.8 mg/dL — ABNORMAL HIGH (ref 1.7–2.4)

## 2022-01-15 LAB — COOXEMETRY PANEL
Carboxyhemoglobin: 1.4 % (ref 0.5–1.5)
Methemoglobin: <0.7 % (ref 0.0–1.5)
O2 Saturation: 74.9 %
Total hemoglobin: 8.6 g/dL — ABNORMAL LOW (ref 12.0–16.0)

## 2022-01-15 LAB — APTT: aPTT: 61 seconds — ABNORMAL HIGH (ref 24–36)

## 2022-01-15 LAB — HEPARIN LEVEL (UNFRACTIONATED): Heparin Unfractionated: 0.54 IU/mL (ref 0.30–0.70)

## 2022-01-15 MED ORDER — NOREPINEPHRINE 4 MG/250ML-% IV SOLN
0.0000 ug/min | INTRAVENOUS | Status: DC
  Administered 2022-01-15: 8 ug/min via INTRAVENOUS
  Administered 2022-01-16: 10 ug/min via INTRAVENOUS
  Administered 2022-01-16: 9 ug/min via INTRAVENOUS
  Administered 2022-01-16: 11 ug/min via INTRAVENOUS
  Administered 2022-01-17: 10 ug/min via INTRAVENOUS
  Filled 2022-01-15 (×6): qty 250

## 2022-01-15 MED ORDER — METOCLOPRAMIDE HCL 5 MG/ML IJ SOLN: 10.0000 mg | Freq: Once | INTRAMUSCULAR | Status: AC

## 2022-01-15 MED ORDER — METOCLOPRAMIDE HCL 5 MG/ML IJ SOLN
INTRAMUSCULAR | Status: AC
  Administered 2022-01-15: 10 mg via INTRAVENOUS
  Filled 2022-01-15: qty 2

## 2022-01-15 MED ORDER — BISACODYL 10 MG RE SUPP
10.0000 mg | Freq: Every day | RECTAL | Status: DC | PRN
Start: 2022-01-15 — End: 2022-01-19
  Administered 2022-01-15: 10 mg via RECTAL
  Filled 2022-01-15: qty 1

## 2022-01-15 MED ORDER — FENTANYL CITRATE PF 50 MCG/ML IJ SOSY
50.0000 ug | PREFILLED_SYRINGE | INTRAMUSCULAR | Status: DC | PRN
  Administered 2022-01-15 (×2): 50 ug via INTRAVENOUS
  Administered 2022-01-15: 100 ug via INTRAVENOUS
  Administered 2022-01-15 – 2022-01-18 (×12): 50 ug via INTRAVENOUS
  Filled 2022-01-15 (×2): qty 1
  Filled 2022-01-15: qty 2
  Filled 2022-01-15 (×13): qty 1

## 2022-01-15 MED ORDER — DEXMEDETOMIDINE HCL IN NACL 400 MCG/100ML IV SOLN
0.4000 ug/kg/h | INTRAVENOUS | Status: DC
  Administered 2022-01-15 – 2022-01-16 (×2): 0.4 ug/kg/h via INTRAVENOUS
  Administered 2022-01-17 – 2022-01-18 (×3): 0.5 ug/kg/h via INTRAVENOUS
  Filled 2022-01-15 (×6): qty 100

## 2022-01-15 MED ORDER — DEXTROSE 5 % IV SOLN
15.0000 mmol | Freq: Once | INTRAVENOUS | Status: AC
  Administered 2022-01-15: 15 mmol via INTRAVENOUS
  Filled 2022-01-15: qty 5

## 2022-01-15 MED ORDER — PROSOURCE TF20 ENFIT COMPATIBL EN LIQD
60.0000 mL | Freq: Two times a day (BID) | ENTERAL | Status: DC
  Administered 2022-01-15 – 2022-01-17 (×5): 60 mL
  Filled 2022-01-15 (×5): qty 60

## 2022-01-15 NOTE — Progress Notes (Signed)
STRUCTURAL HD NOTE:  Patient Name: Stephanie Franco Date of Encounter: 01/15/2022  [place] Cardiologist: Glenetta Hew, MD   Subjective   Remains intubated/sedated/critically ill  IABP 1:1/DBA 5/amiodarone/heparin; pressors weaned off  Afebrile, CVP 5-6, MAP 70s, Co-ox 74.9%, I/O -1500 (net negative ~10L); fio2 40%  Awake and responsive on vent.  Cultures negative to date, WBC mildly elevated.  Inpatient Medications    Scheduled Meds:  allopurinol  50 mg Per Tube QPM   atorvastatin  80 mg Per Tube Daily   Chlorhexidine Gluconate Cloth  6 each Topical Daily   docusate  100 mg Per Tube BID   ezetimibe  10 mg Per Tube Daily   feeding supplement (PROSource TF20)  60 mL Per Tube Daily   insulin aspart  0-15 Units Subcutaneous Q4H   insulin glargine-yfgn  20 Units Subcutaneous QHS   multivitamin  1 tablet Oral QHS   mouth rinse  15 mL Mouth Rinse Q2H   pantoprazole (PROTONIX) IV  40 mg Intravenous Q12H   polyethylene glycol  17 g Per Tube Daily   senna-docusate  1 tablet Per Tube BID   sodium chloride flush  10-40 mL Intracatheter Q12H   sodium chloride flush  3 mL Intravenous Q12H   Continuous Infusions:   prismasol BGK 4/2.5 400 mL/hr at 01/15/22 0428    prismasol BGK 4/2.5 400 mL/hr at 01/15/22 0429   sodium chloride Stopped (01/10/22 2245)   amiodarone 30 mg/hr (01/15/22 0700)   DOBUTamine 5 mcg/kg/min (01/15/22 0700)   feeding supplement (VITAL 1.5 CAL) 45 mL/hr at 01/15/22 0700   fentaNYL infusion INTRAVENOUS Stopped (01/15/22 0512)   heparin 500 Units/hr (01/15/22 0700)   norepinephrine (LEVOPHED) Adult infusion Stopped (01/15/22 0525)   prismasol BGK 4/2.5 1,500 mL/hr at 01/15/22 0428   propofol (DIPRIVAN) infusion Stopped (01/15/22 0512)   vasopressin Stopped (12/29/2021 2024)   PRN Meds: sodium chloride, acetaminophen, albuterol, alteplase, fentaNYL (SUBLIMAZE) injection, heparin, LORazepam, naphazoline-glycerin, ondansetron (ZOFRAN) IV, mouth  rinse, oxyCODONE, sodium chloride, sodium chloride flush, sodium chloride flush   Vital Signs    Vitals:   01/15/22 0615 01/15/22 0630 01/15/22 0645 01/15/22 0700  BP:    (!) 121/53  Pulse: (!) 195 (!) 125 (!) 210 (!) 207  Resp: 14 (!) 26 (!) 21 (!) 21  Temp: (!) 96.6 F (35.9 C) (!) 96.6 F (35.9 C) (!) 96.6 F (35.9 C) (!) 96.8 F (36 C)  TempSrc:      SpO2: 100% 100% 100% 100%  Weight:      Height:        Intake/Output Summary (Last 24 hours) at 01/15/2022 0730 Last data filed at 01/15/2022 0700 Gross per 24 hour  Intake 2579.6 ml  Output 4079 ml  Net -1499.4 ml      01/15/2022    4:46 AM 01/14/2022    4:45 AM 01/13/2022    5:00 AM  Last 3 Weights  Weight (lbs) 151 lb 3.8 oz 157 lb 10.1 oz 162 lb 11.2 oz  Weight (kg) 68.6 kg 71.5 kg 73.8 kg      Telemetry    Atrial fibrillation with occasional ventricular ectopy - Personally Reviewed  ECG    Not performed- Personally Reviewed  Physical Exam   GEN: Intubated, awake Neck: No JVD Cardiac: Irregular rate and rhythm; 2/6 HSM Respiratory: Anterior lung fields clear  GI: Soft, nontender, obese MS: No edema; No deformity. Neuro:  Sedated Psych: Sedated Vasc:  LFA IABP intact  Labs  High Sensitivity Troponin:  No results for input(s): "TROPONINIHS" in the last 720 hours.   Chemistry Recent Labs  Lab 12/31/2021 0416 01/13/2022 1600 01/13/22 1648 01/14/22 0338 01/14/22 1719 01/15/22 0425  NA 136   < > 137 136 137 137  K 4.3   < > 4.3 4.7 4.8 4.9  CL 104   < > 103 99 100 102  CO2 24   < > '25 25 26 26  '$ GLUCOSE 109*   < > 175* 184* 194* 187*  BUN 18   < > '18 16 16 19  '$ CREATININE 1.54*   < > 1.30* 1.27* 1.39* 1.34*  CALCIUM 8.0*   < > 8.7* 9.2 9.3 9.5  MG 2.5*   < > 2.8* 2.6*  --  2.8*  PROT 6.8  --   --   --   --   --   ALBUMIN 2.8*   < > 2.6* 2.7* 2.6* 2.5*  AST 154*  --   --   --   --   --   ALT 86*  --   --   --   --   --   ALKPHOS 74  --   --   --   --   --   BILITOT 1.0  --   --   --   --   --    GFRNONAA 36*   < > 44* 45* 40* 42*  ANIONGAP 8   < > '9 12 11 9   '$ < > = values in this interval not displayed.    Lipids  Recent Labs  Lab 01/14/22 0338  TRIG 61    Hematology Recent Labs  Lab 01/13/22 0442 01/14/22 0338 01/15/22 0425  WBC 11.4* 10.3 11.4*  RBC 2.95* 3.14* 3.20*  HGB 7.9* 8.3* 8.4*  HCT 25.9* 27.1* 27.4*  MCV 87.8 86.3 85.6  MCH 26.8 26.4 26.3  MCHC 30.5 30.6 30.7  RDW 19.9* 20.0* 20.0*  PLT 182 159 141*   Thyroid No results for input(s): "TSH", "FREET4" in the last 168 hours.  BNPNo results for input(s): "BNP", "PROBNP" in the last 168 hours.  DDimer No results for input(s): "DDIMER" in the last 168 hours.   Radiology    Chest x-ray today demonstrates cardiomegaly and no significant effusions or infiltrates  Cardiac Studies   Limited echocardiogram from August 26 demonstrates an ejection fraction of 25 to 30% with severe mitral regurgitation, severe tricuspid regurgitation, residual ASD, and the right ventricle looks moderately reduced with moderate dysfunction.  There is modest left to right shunting seen.   Assessment & Plan    1.  Cardiogenic shock: Much improved after massive fluid removal (~10L) and now off pressors.  Co-ox, MAP, CVP reasonable.  Remains on DBA and IABP.  No signs of septic shock (cultures negative).  I discussed the case with Dr. Aundra Dubin and the patient's family.  Will plan on repeat attempt at [facility] + ASD closure on Tuesday provided no signs of sepsis.   2.  Severe polyvalvular disease:  Last TEE with severe TR and MR.  TR due to severe volume overload.  Current plan for mTEER + ASD closure + RIJ swan (unable to be placed from [facility] during IABP implantation).  I suspect her TR will look improved on future TEE assessment.   3.  ASD:  Qp/Qs 1.8.  Will plan on closure of ASD(s) on Tuesday. 5.  AKI: On CVVH circuit; continue heparin drip. 4.  Respiratory failure: CXR  without infiltrates, remains afebrile, fio2 down to 40%.  Discussed  with Dr. Aundra Dubin, would wean to extubate soon and plan on reintubation for procedure on Tuesday. 5.  Atrial fibrillation: Continue amiodarone and heparin infusions. 6.  Metastatic breast cancer: She is not a candidate for an LVAD.  CRITICAL CARE Performed by: Lenna Sciara   Total critical care time: 30 minutes. Critical care time was exclusive of separately billable procedures and treating other patients. Critical care was necessary to treat or prevent imminent or life-threatening deterioration. Critical care was time spent personally by me on the following activities: development of treatment plan with patient and/or surrogate as well as nursing, discussions with consultants, evaluation of patient's response to treatment, examination of patient, obtaining history from patient or surrogate, ordering and performing treatments and interventions, ordering and review of laboratory studies, ordering and review of radiographic studies, pulse oximetry and re-evaluation of patient's condition.     For questions or updates, please contact [place] Please consult [email or web address] for contact info under        Signed, Early Osmond, MD  01/15/2022, 7:30 AM

## 2022-01-15 NOTE — Progress Notes (Signed)
NAME:  Stephanie Franco, MRN:  [id number], DOB:  [birth year], LOS: 7 ADMISSION DATE:  12/28/2021, CONSULTATION DATE:  01/10/22 REFERRING MD:  Debara Pickett, CHIEF COMPLAINT:  SOB   History of Present Illness:  71 year old woman with hx of afib on eliquis, NICM, CKD, DM2, HLD, HTN p/w symptomatic sevre mitral regurgitation manifested by DOE.  She had attempted mitral clipping via atrial septostomy on 12/20/2021 that was aborted due to inability to cross septum.  Subsequently has developed worsening oliguria and today more somnolence.  Initially PCCM consulted for central access but patient has deteriorated further with brief cardiac arrest due to hypoxemia requiring intubation and transfer to ICU.  Pertinent  Medical History    has a past medical history of Anxiety, Aortic atherosclerosis ([place]), Arthritis, Asthma, Atrial fibrillation, permanent ([place]), Breast cancer ([place]) (2008), CAD S/P percutaneous coronary angioplasty (2006), CHF (congestive heart failure), NYHA class II, chronic, combined ([place]), CKD (chronic kidney disease), stage III ([place]), Complication of anesthesia, Diabetes mellitus, uncontrolled (07/14/2011), Dilated cardiomyopathy ([place]) (06/2021), Fibroid, uterine (07/13/2011), GERD (gastroesophageal reflux disease), Gout, Hyperlipidemia, Hypertension, Hypokalemia, Morbid obesity ([place]), OSA on CPAP, Pneumonia (2018), Pulmonary hypertension, unspecified ([place]), and Severe mitral regurgitation.   Significant Hospital Events: Including procedures, antibiotic start and stop dates in addition to other pertinent events   8/24 admit, failed mitraclip 8/25 worsening BPs, worsening renal function 8/26 PCCM consult, central line placed, brief code, transfer to ICU intubated, started on CVVH 8/28 RHC and balloon pump placed 8/31 INO stopped today evening.  Pressors and sedation weaned off a.m. and currently tolerating SBT  Interim History / Subjective:  Alert and interactive on ventilator Hemodynamically  stable off pressor support Remains on amiodarone and dobutamine drips CRRT remains in place with net -10 L  Objective   Blood pressure (!) 121/53, pulse (!) 207, temperature (!) 96.8 F (36 C), resp. rate (!) 21, height '5\' 3"'$  (1.6 m), weight 68.6 kg, SpO2 100 %. CVP:  [2 mmHg-15 mmHg] 5 mmHg  Vent Mode: PSV;CPAP FiO2 (%):  [40 %-50 %] 40 % Set Rate:  [18 bmp] 18 bmp Vt Set:  [420 mL] 420 mL PEEP:  [5 cmH20] 5 cmH20 Pressure Support:  [8 cmH20] 8 cmH20 Plateau Pressure:  [16 cmH20-17 cmH20] 16 cmH20   Intake/Output Summary (Last 24 hours) at 01/15/2022 5009 Last data filed at 01/15/2022 0700 Gross per 24 hour  Intake 2579.6 ml  Output 4079 ml  Net -1499.4 ml    Filed Weights   01/13/22 0500 01/14/22 0445 01/15/22 0446  Weight: 73.8 kg 71.5 kg 68.6 kg    Examination: General: Acute on chronic ill appearing elderly female lying in bed on mechanical ventilation, in NAD HEENT: ETT, MM pink/moist, PERRL,  Neuro: Alert and interactive on ventilator, able to follow commands CV: Irregularly irregular rate and rhythm, no murmur, rubs, or gallops,  PULM: Auscultation bilaterally, no increased work of breathing, no added breath sounds, tolerating ventilator GI: soft, bowel sounds active in all 4 quadrants, non-tender, non-distended, tolerating TF Extremities: warm/dry, no edema  Skin: no rashes or lesions  Resolved problems  Possible sepsis of unclear etiology Cefepime stopped 8/30  Assessment & Plan:   Cardiogenic shock secondary to acute on chronic systolic heart failure with ischemic cardiomyopathy Atrial fibrillation Frequent PVCs CAD Severe MR - s/p failed MitraClip procedure P: Continuous telemetry  Plans for repeat attempt at MitraClip 9/1 Strict intake and output  IABP per heart failure Weaned off pressors as of 8/31 Daily weight to assess volume  status Volume removal per CRRT Closely monitor renal function and electrolytes  Vent support as below  Ensure  hemodynamic control Continue amiodarone, heparin and dobutamine drips for heart failure  Severe mixed venous, arterial pulmonary hypertension -PVR 11 on prior cardiac cath with elevated right and left heart filling pressures -INO weaned off 8/30 P: Continue volume removal per CRRT as below Vent support as below  Acute hypoxic respiratory failure -Multifactorial including brief PEA cardiac arrest and severe cardiogenic shock P: Currently tolerating SBT well hopeful for extubation today however patient will need to be reintubated if MitraClip is reattempted 9/1 Continue ventilator support with lung protective strategies  Wean PEEP and FiO2 for sats greater than 90%. Head of bed elevated 30 degrees. Plateau pressures less than 30 cm H20.  Follow intermittent chest x-ray and ABG.   SAT/SBT as tolerated, mentation preclude extubation  Ensure adequate pulmonary hygiene  Follow cultures  VAP bundle in place  PAD protocol  AKI superimposed on chronic kidney disease -Baseline creatinine appears to be 1.7-2.4 -CRRT started 8/26 P: Nephrology following, appreciate assistance  Volume removal per CRRT, mains anuric Follow renal function  Monitor urine output Trend Bmet Avoid nephrotoxins Ensure adequate renal perfusion   Hypoalbuminemia P: Continue tube feeds Protein supplementation  Anemia P: Monitor for signs of bleeding Transfuse per protocol Hemoglobin goal greater than 7  Best Practice (right click and "Reselect all SmartList Selections" daily)   Diet/type: tubefeeds and NPO DVT prophylaxis: systemic heparin GI prophylaxis: PPI Lines: Central line Foley:  Yes, and it is still needed Code Status:  full code - husband and son requested change from DNR to full code while trying to stabilize her Last date of multidisciplinary goals of care discussion: Husband updated at bedside 8/31  Critical care time: 38 min.  Madigan Rosensteel D. Kenton Kingfisher, NP-C [facility] Personal contact information can be found on Amion  01/15/2022, 7:15 AM

## 2022-01-15 NOTE — Procedures (Signed)
Cortrak  Tube Type:  Cortrak - 43 inches Tube Location:  Left nare Initial Placement:  Stomach Secured by: Bridle Technique Used to Measure Tube Placement:  Marking at nare/corner of mouth Cortrak Secured At:  80 cm   Cortrak Tube Team Note:  Consult received to place a Cortrak feeding tube.   RD unable to advance tube post pyloric. Recommend fluoroscopy guided advancement of tube if post pyloric placement is still needed.   X-ray is required, abdominal x-ray has been ordered by the Cortrak team. Please confirm tube placement before using the Cortrak tube.   If the tube becomes dislodged please keep the tube and contact the Cortrak team at [email or web address] (password [email or web address]) for replacement.  If after hours and replacement cannot be delayed, place a NG tube and confirm placement with an abdominal x-ray.    Koleen Distance MS, RD, LDN Please refer to [facility] for RD and/or RD on-call/weekend/after hours pager

## 2022-01-15 NOTE — Progress Notes (Signed)
Patient ID: Stephanie Franco, female   DOB: Mar 07, 1951, 71 y.o.   MRN: [id number]  Advanced Heart Failure Rounding Note  PCP-Cardiologist: Glenetta Hew, MD   Subjective:    08/27: Milrinone switched to DBA d/t pressor requirement 08/28: RHC + IABP placement. Unable to keep Swan in position due to pulsatility of heart with severe TR/MR so removed.   Continues on 5 DBA, off NE and NO.  IABP 1:1, augmenting appropriately.    I/Os net negative yesterday pulling UF via CVVH 50 cc/hr net negative, CVP down to 11.   Intubated and sedated. Afebrile. Weaning on vent this morning.   Remains in AF 90s on heparin gtt and amiodarone 30.   Heart Pressures RHC Procedural Findings: Hemodynamics (mmHg) RA mean 22, V wave to 32 RV 74/18 PA 78/33, mean 51 PCWP mean 28, V wave to 38  Oxygen saturations: IVC 73%  RA 85% RV 85% PA 85% AO 100%  Cardiac Output (Fick) 13 (high output with shunt) Cardiac Index (Fick) 7.7  PVR < 2 WU  Shunt fraction 1.8/1    Objective:   Weight Range: 68.6 kg Body mass index is 26.79 kg/m.   Vital Signs:   Temp:  [96.6 F (35.9 C)-100.4 F (38 C)] 96.8 F (36 C) (08/31 0700) Pulse Rate:  [95-237] 207 (08/31 0700) Resp:  [11-48] 21 (08/31 0700) BP: (101-131)/(31-56) 121/53 (08/31 0700) SpO2:  [94 %-100 %] 100 % (08/31 0700) Arterial Line BP: (83-131)/(36-56) 121/53 (08/31 0700) FiO2 (%):  [40 %] 40 % (08/31 0503) Weight:  [68.6 kg] 68.6 kg (08/31 0446) Last BM Date : 01/14/22  Weight change: Filed Weights   01/13/22 0500 01/14/22 0445 01/15/22 0446  Weight: 73.8 kg 71.5 kg 68.6 kg    Intake/Output:   Intake/Output Summary (Last 24 hours) at 01/15/2022 0811 Last data filed at 01/15/2022 0700 Gross per 24 hour  Intake 2469.16 ml  Output 3826 ml  Net -1356.84 ml      Physical Exam    General: NAD on vent Neck: JVP 10 cm, no thyromegaly or thyroid nodule.  Lungs: Clear to auscultation bilaterally with normal respiratory effort. CV:  Nondisplaced PMI.  Heart regular S1/S2, no S3/S4, 2/6 HSM apex.  No peripheral edema.   Abdomen: Soft, nontender, no hepatosplenomegaly, no distention.  Skin: Intact without lesions or rashes.  Neurologic: Will wake up/follow commands.  Extremities: No clubbing or cyanosis.  HEENT: Normal.   Telemetry   Afib 90s, 10-15 PVCs/min (personally reviewed)  Labs    CBC Recent Labs    01/14/22 0338 01/15/22 0425  WBC 10.3 11.4*  HGB 8.3* 8.4*  HCT 27.1* 27.4*  MCV 86.3 85.6  PLT 159 272*   Basic Metabolic Panel Recent Labs    01/14/22 0338 01/14/22 1719 01/15/22 0425  NA 136 137 137  K 4.7 4.8 4.9  CL 99 100 102  CO2 '25 26 26  '$ GLUCOSE 184* 194* 187*  BUN '16 16 19  '$ CREATININE 1.27* 1.39* 1.34*  CALCIUM 9.2 9.3 9.5  MG 2.6*  --  2.8*  PHOS 2.8 1.7* 1.7*   Liver Function Tests Recent Labs    01/14/22 1719 01/15/22 0425  ALBUMIN 2.6* 2.5*   No results for input(s): "LIPASE", "AMYLASE" in the last 72 hours. Cardiac Enzymes No results for input(s): "CKTOTAL", "CKMB", "CKMBINDEX", "TROPONINI" in the last 72 hours.  BNP: BNP (last 3 results) Recent Labs    10/07/21 1513 11/12/21 1442 12/01/21 1531  BNP 71.8 91.4 104.2*  ProBNP (last 3 results) No results for input(s): "PROBNP" in the last 8760 hours.   D-Dimer No results for input(s): "DDIMER" in the last 72 hours. Hemoglobin A1C No results for input(s): "HGBA1C" in the last 72 hours. Fasting Lipid Panel Recent Labs    01/14/22 0338  TRIG 61   Thyroid Function Tests No results for input(s): "TSH", "T4TOTAL", "T3FREE", "THYROIDAB" in the last 72 hours.  Invalid input(s): "FREET3"  Other results:   Imaging    DG CHEST PORT 1 VIEW  Result Date: 01/15/2022 CLINICAL DATA:  Intubation, balloon pump EXAM: PORTABLE CHEST 1 VIEW COMPARISON:  Portable exam 0527 hours compared to 01/14/2022 FINDINGS: Tip of endotracheal tube projects 2.7 cm above carina. Nasogastric tube coiled in proximal stomach.  BILATERAL jugular venous catheters with tips projecting over SVC. Tip of intra-aortic balloon pump at mid T6 below aortic arch. Enlargement of cardiac silhouette. Mediastinal contours and pulmonary vascularity normal. Atherosclerotic calcification aorta. Calcified subcarinal lymph node. Lungs grossly clear without infiltrate, pleural effusion, or pneumothorax. IMPRESSION: Marked enlargement of cardiac silhouette. No acute infiltrate. Aortic Atherosclerosis (ICD10-I70.0). Electronically Signed   By: Lavonia Dana M.D.   On: 01/15/2022 08:07     Medications:     Scheduled Medications:  allopurinol  50 mg Per Tube QPM   atorvastatin  80 mg Per Tube Daily   Chlorhexidine Gluconate Cloth  6 each Topical Daily   docusate  100 mg Per Tube BID   ezetimibe  10 mg Per Tube Daily   feeding supplement (PROSource TF20)  60 mL Per Tube Daily   insulin aspart  0-15 Units Subcutaneous Q4H   insulin glargine-yfgn  20 Units Subcutaneous QHS   multivitamin  1 tablet Oral QHS   mouth rinse  15 mL Mouth Rinse Q2H   pantoprazole (PROTONIX) IV  40 mg Intravenous Q12H   polyethylene glycol  17 g Per Tube Daily   senna-docusate  1 tablet Per Tube BID   sodium chloride flush  10-40 mL Intracatheter Q12H   sodium chloride flush  3 mL Intravenous Q12H    Infusions:   prismasol BGK 4/2.5 400 mL/hr at 01/15/22 0428    prismasol BGK 4/2.5 400 mL/hr at 01/15/22 0429   sodium chloride Stopped (01/10/22 2245)   amiodarone 30 mg/hr (01/15/22 0700)   DOBUTamine 5 mcg/kg/min (01/15/22 0700)   feeding supplement (VITAL 1.5 CAL) 45 mL/hr at 01/15/22 0700   fentaNYL infusion INTRAVENOUS Stopped (01/15/22 0512)   heparin 500 Units/hr (01/15/22 0700)   norepinephrine (LEVOPHED) Adult infusion Stopped (01/15/22 0525)   prismasol BGK 4/2.5 1,500 mL/hr at 01/15/22 0428   propofol (DIPRIVAN) infusion Stopped (01/15/22 0512)   vasopressin Stopped (01/01/2022 2024)    PRN Medications: sodium chloride, acetaminophen, albuterol,  alteplase, fentaNYL (SUBLIMAZE) injection, heparin, LORazepam, naphazoline-glycerin, ondansetron (ZOFRAN) IV, mouth rinse, oxyCODONE, sodium chloride, sodium chloride flush, sodium chloride flush     Assessment/Plan   1. Acute on chronic systolic CHF => cardiogenic shock: Mixed ischemic/nonischemic cardiomyopathy. RHC in 2/23 showed elevated R>L heart filling pressures with pulmonary arterial hypertension and low CI 1.89. Cardiac MRI w/ myocardial LGE in septum is in a noncoronary pattern, ? myocarditis, anterior wall LGE is subendocardial and may be due to prior MI => ?mixed ischemic/nonischemic cardiomyopathy. TEE (6/23) with EF 30-35%, moderate RV dysfunction, severe MR.  She had attempted Mitraclip placment this admission with failure, now with cardiogenic shock, AKI, and marked volume overload.  Echo with EF 25-30%, moderate RV enlargement with moderately decreased systolic function, D-shaped  septum, PASP 67, severe biatrial enlargement, severe MR, severe TR, markedly dilated IVC, moderate ASD 4 x 7 mm at atrial septostomy site. Intubated on pressors with CVVH running.  IABP in place, did not place Impella as re-attempted Mitraclip procedure, which is probably our only good option at this time to improve her function, would require manipulation of wire across the LA to LV (discussed with Dr. Ali Lowe).   She is now off NE and NO, remain on dobutamine 5.  IABP 1:1, pulses dopplerable.  Co-ox 75% (in setting of L=>R shunt). Net negative 1449 cc and weight down pulling 50 cc/hr net UF via CVVH, CVP 11 today.  We are trying to stabilize her for repeat Mitraclip + iatrogenic ASD closure.  - Continue dobutamine for now.  - Continue CVVH, pull 50 cc/hr net negative UF.   - Prognosis is guarded.  She has metastatic breast cancer, not candidate for LVAD.  She has severe MR and TR with biventricular dysfunction (ASD also likely plays at least a small role in decompensation).  Failed Mitraclip earlier this  admission. Think our only possible option would be to get her stable enough to re-attempt Mitraclip + iatrogenic ASD closure => plan for Tuesday.  2. Atrial fibrillation: This is permanent, x years. Rate 80s-90s + frequent PVCs (10-15/min).  - Continue amiodarone gtt 30 mg/hr.  - Heparin gtt for anticoagulation.  3. PVCs: Frequent during 2/23 admission but was also on milrinone. ? Contributing to cardiomyopathy.   - Amiodarone gtt. 4. Mitral regurgitation:  TEE (6/23) with severe MR with restricted posterior leaflet in setting of dilation of the LA and LV. mTEER attempted on 8/24 but failed.  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  As above, will try to stabilize her enough to consider repeat Mitraclip.  - Mitraclip/ASD closure on Tuesday.  5. AKI on CKD stage 3: AKI in setting of cardiogenic shock, now on CVVH.  - As above, aim for net negative UF up to 50 cc/hr today.  6. Pulmonary hypertension: RHC this admission with pulmonary venous hypertension.  7. CAD: Cath 2/23 with prox RCA to Mid RCA lesion 60% stenosis, distal LCx lesion 90% stenosis (similar to the past).  Medical management. Not clear that CAD can explain her cardiomyopathy.  - Continue statin/Zetia  - No ASA given anticoagulation.   8. Acute hypoxemic respiratory failure: Intubated in setting of pulmonary edema. Off NO.  - Attempt to wean vent/extubate today.  9. ID:  Empirically covered with cefepime for possible sepsis, now off abx.  10. 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  Qp/Qs 1.8 on RHC, will need closure with re-attempt at Mitraclip.  11. Full code for now.    Length of Stay: 7  CRITICAL CARE Performed by: Loralie Champagne  Total critical care time: 40 minutes  Critical care time was exclusive of separately billable procedures and treating other patients.  Critical care was necessary to treat or prevent imminent or life-threatening  deterioration.  Critical care was time spent personally by me on the following activities: development of treatment plan with patient and/or surrogate as well as nursing, discussions with consultants, evaluation of patient's response to treatment, examination of patient, obtaining history from patient or surrogate, ordering and performing treatments and interventions, ordering and review of laboratory studies, ordering and review of radiographic studies, pulse oximetry and re-evaluation of patient's condition.   Loralie Champagne. 01/15/2022  Advanced Heart Failure Team Pager [phone] (M-F; 7a -  5p)  Please contact [facility] for night-coverage after hours (5p -7a ) and weekends on [email or web address]

## 2022-01-15 NOTE — Procedures (Signed)
Extubation Procedure Note  Patient Details:   Name: Stephanie Franco DOB: 1950/07/08 MRN: [id number]   Airway Documentation:    Vent end date: 01/15/22 Vent end time: 0822   Evaluation  O2 sats: stable throughout Complications: No apparent complications Patient did tolerate procedure well. Bilateral Breath Sounds: Diminished   Yes  Patient tolerated wean. MD ordered to extubate. Positive for cuff leak. Patient extubated to a 4 Lpm nasal cannula. No signs of dyspnea or stridor noted. Patient instructed on the Incentive Spirometer achieving 442m at best, at this time. RN at bedside. Patient resting comfortably.   [name] Neither8/31/2023, 8:58 AM

## 2022-01-15 NOTE — Progress Notes (Addendum)
Nutrition Follow-up  DOCUMENTATION CODES:   Not applicable  INTERVENTION:   Tube Feeding via Cortrak:  Begin Vital 1.5 at 20, titrate by 10 mL q 12 hours until goal rate of 45 ml/hr Pro-source TF20 60 mL BID Goal regimen provides 113 g of protein, 1780 kcals, 821 mL of free water  While unable to maintain HOB>30 degrees, recommend reverse trendelenburg of at least >10 degrees  Monitor bowel function; no BM in 7 days (except for small BM yesterday), pt receiving bowel regimen  Renal MVI daily  Recommend supplementation of phosphorus while on CRRT to maintain levels >2.0   NUTRITION DIAGNOSIS:   Inadequate oral intake related to acute illness as evidenced by NPO status.  Being addressed via TF  GOAL:   Patient will meet greater than or equal to 90% of their needs  Progressing  MONITOR:   Vent status, TF tolerance, Weight trends, Labs  REASON FOR ASSESSMENT:   Ventilator    ASSESSMENT:   71 yo female admitted for mitral clipping which was attempted but unsuccessful, pt developed worsening renal function and increased lethargy followed by brief cardiac arrest and intubation. Pt with AKI on CKD now on CRRT, cardiogenic shock. PMH includes severe mitral regurgitation with LVEF 20-25%, metastatic breast cancer,  CAD, CKD 3, DM, gout, HL, HTN, OSA  8/24 Admitted, failed mitral clip 8/26 Brief code, Intubated, started on CRRT 8/28 RHC and IABP placed 8/31 Extubated, Cortrak placed  Extubated today, remains on CRRT, IABP 1:1, NPO. Remains on dobutamine, off levophed  Prior to extubation, pt tolerating TF  RN reports pt complaining of some abdominal pain, no BM since admission (except for small BM yesterday), bowel regimen has been increased  Phosphorus 1.7, on CRRT, not supplemented today. Reached out to PCCM and plan to supplement  Current wt 68.6 kg; admit weight 78.9 kg. +wt loss per weight encounters Remains anuric; Net negative 10 L since admission  Labs:  phosphorus 1.7, CBGs 171-221 Meds:  colace, ss novolog, semglee, renal MVI, ss novolog, miralax, senna-docusate, dulcolax suppository  Diet Order:   Diet Order     None       EDUCATION NEEDS:   Not appropriate for education at this time  Skin:  Skin Assessment: Skin Integrity Issues: Skin Integrity Issues:: Stage II Stage II: sacrum  Last BM:  8/30 +small BM  Height:   Ht Readings from Last 1 Encounters:  01/11/2022 '5\' 3"'$  (1.6 m)    Weight:   Wt Readings from Last 1 Encounters:  01/15/22 68.6 kg    BMI:  Body mass index is 26.79 kg/m.  Estimated Nutritional Needs:   Kcal:  1600-1800 kcals  Protein:  105-120 g  Fluid:  1.5 L   Kerman Passey MS, RDN, LDN, CNSC Registered Dietitian 3 Clinical Nutrition RD Pager and On-Call Pager Number Located in [place]

## 2022-01-15 NOTE — Progress Notes (Signed)
[facility] NEPHROLOGY PROGRESS NOTE  Assessment/ Plan: Pt is a 71 y.o. yo female  w/ hx of anemia, breast cancer, CAD, HFrEF, CKD 3, DM2, gout, HL, HTN, morbid obesity, OSA, pHTN, severe mitral regurgitation w/ LVEF 20-25% who presented on 8/24 for planned TEE for the MR.  Patient became hypoxic, had PEA arrest, intubated, developed AKI and transferred to ICU.  #Acute kidney injury on CKD stage IV: b/l cr arounf 1.7-2.4.  AKI due to cardiac arrest/CHF.  Moved to ICU for inotropes including dobutamine and vasopressors.  Started CRRT on 8/26 and has been tolerating well. Remains anuric without sign of renal recovery.  Tolerating UF with CRRT and CVP around 11.  Continue CRRT with UF goal around 50-100 cc/ hr.  All 4K bath, IV heparin for anticoagulation.    #Acute on chronic systolic CHF/cardiogenic shock: Currently on dobutamine, Levophed.  UF/volume management with CRRT.  Heart failure team is following.  #A-fib with RVR: On amiodarone and anticoagulation.  #VDRF/acute hypoxic respiratory failure: On mechanical ventilation.  She is off sedation and alert today.  Hopefully extubation soon, per pulmonary team.  #Anemia: Monitor hemoglobin. Discussed with the patient's husband.  Subjective: Seen and examined in ICU.  She is off of sedation and looks more alert awake and following simple commands.  Remains intubated and plan to extubate later today per nurse.  No urine output and so far tolerating CRRT well.  Discussed with ICU nurse and her husband was present at the bedside.  Objective Vital signs in last 24 hours: Vitals:   01/15/22 0800 01/15/22 0815 01/15/22 0822 01/15/22 0830  BP: (!) 125/47  (!) 127/53   Pulse:   92   Resp: (!) 22 17 (!) 24 20  Temp: (!) 96.8 F (36 C) (!) 95.4 F (35.2 C) (!) 96.6 F (35.9 C)   TempSrc:      SpO2: 100% 100% 100% 99%  Weight:      Height:       Weight change: -2.9 kg  Intake/Output Summary (Last 24 hours) at 01/15/2022  0928 Last data filed at 01/15/2022 0800 Gross per 24 hour  Intake 2432.29 ml  Output 3761 ml  Net -1328.71 ml        Labs: RENAL PANEL Recent Labs    09/18/21 1808 09/18/21 2103 09/19/21 0427 09/19/21 0921 09/20/21 0436 09/20/21 0827 01/05/22 1040 01/09/22 0034 01/10/22 0106 01/10/22 1744 01/11/22 0357 01/11/22 1610 12/23/2021 0414 12/16/2021 0416 01/10/2022 1600 12/20/2021 1602 12/16/2021 1605 01/05/2022 1616 12/30/2021 1642 12/23/2021 1852 01/13/22 0442 01/13/22 1648 01/14/22 0338 01/14/22 1719 01/15/22 0425  NA  --    < >  --    < >  --    < > 135   < > 135   < > 135 135  --  136   < > '136 136 135 136 135 135 137 136 137 137 '$  K <2.0*   < >  --    < >  --    < > 3.6   < > 4.9   < > 4.2 4.4  --  4.3   < > 4.5 4.6 4.5 4.5 4.3 3.9 4.3 4.7 4.8 4.9  CL  --    < >  --    < >  --    < > 93*   < > 99  --  99 100  --  104  --   --   --   --   --  102 100 103 99 100 102  CO2  --    < >  --    < >  --    < > 29   < > 23  --  23 24  --  24  --   --   --   --   --  '22 26 25 25 26 26  '$ GLUCOSE  --    < >  --    < >  --    < > 108*   < > 131*  --  133* 93  --  109*  --   --   --   --   --  142* 191* 175* 184* 194* 187*  BUN  --    < >  --    < >  --    < > 45*   < > 52*  --  39* 24*  --  18  --   --   --   --   --  '18 19 18 16 16 19  '$ CREATININE  --    < >  --    < >  --    < > 2.44*   < > 3.60*  --  2.88* 2.03*  --  1.54*  --   --   --   --   --  1.75* 1.51* 1.30* 1.27* 1.39* 1.34*  CALCIUM  --    < >  --    < >  --    < > 9.6   < > 8.6*  --  8.3* 8.2*  --  8.0*  --   --   --   --   --  8.4* 8.2* 8.7* 9.2 9.3 9.5  MG 1.9  --  2.2  --  2.2  --   --   --   --   --  1.9  --   --  2.5*  --   --   --   --   --  2.6* 2.6* 2.8* 2.6*  --  2.8*  PHOS  --   --  3.3  --   --   --   --   --   --   --  3.9 3.1 2.9  --   --   --   --   --   --  3.9  3.9 2.3* 1.9*  1.9* 2.8 1.7* 1.7*  ALBUMIN  --   --  2.4*  --   --    < > 3.4*  --   --   --  2.9* 2.8*  --  2.8*  --   --   --   --   --  2.6* 2.5* 2.6* 2.7*  2.6* 2.5*   < > = values in this interval not displayed.      Liver Function Tests: Recent Labs  Lab 01/04/2022 0416 12/31/2021 1852 01/14/22 0338 01/14/22 1719 01/15/22 0425  AST 154*  --   --   --   --   ALT 86*  --   --   --   --   ALKPHOS 74  --   --   --   --   BILITOT 1.0  --   --   --   --   PROT 6.8  --   --   --   --   ALBUMIN 2.8*   < > 2.7* 2.6* 2.5*   < > = values  in this interval not displayed.    No results for input(s): "LIPASE", "AMYLASE" in the last 168 hours. No results for input(s): "AMMONIA" in the last 168 hours. CBC: Recent Labs    09/25/21 0835 09/26/21 0214 12/18/2021 1642 12/24/2021 1945 01/13/22 0442 01/14/22 0338 01/15/22 0425  HGB  --    < > 9.5* 7.9* 7.9* 8.3* 8.4*  MCV  --    < >  --  86.3 87.8 86.3 85.6  FERRITIN 442*  --   --   --   --   --   --   TIBC 319  --   --   --   --   --   --   IRON 29  --   --   --   --   --   --    < > = values in this interval not displayed.     Cardiac Enzymes: No results for input(s): "CKTOTAL", "CKMB", "CKMBINDEX", "TROPONINI" in the last 168 hours. CBG: Recent Labs  Lab 01/14/22 1524 01/14/22 1947 01/14/22 2349 01/15/22 0438 01/15/22 0745  GLUCAP 171* 195* 199* 201* 221*     Iron Studies: No results for input(s): "IRON", "TIBC", "TRANSFERRIN", "FERRITIN" in the last 72 hours. Studies/Results: DG CHEST PORT 1 VIEW  Result Date: 01/15/2022 CLINICAL DATA:  Intubation, balloon pump EXAM: PORTABLE CHEST 1 VIEW COMPARISON:  Portable exam 0527 hours compared to 01/14/2022 FINDINGS: Tip of endotracheal tube projects 2.7 cm above carina. Nasogastric tube coiled in proximal stomach. BILATERAL jugular venous catheters with tips projecting over SVC. Tip of intra-aortic balloon pump at mid T6 below aortic arch. Enlargement of cardiac silhouette. Mediastinal contours and pulmonary vascularity normal. Atherosclerotic calcification aorta. Calcified subcarinal lymph node. Lungs grossly clear without infiltrate,  pleural effusion, or pneumothorax. IMPRESSION: Marked enlargement of cardiac silhouette. No acute infiltrate. Aortic Atherosclerosis (ICD10-I70.0). Electronically Signed   By: Lavonia Dana M.D.   On: 01/15/2022 08:07   DG CHEST PORT 1 VIEW  Result Date: 01/14/2022 CLINICAL DATA:  Endotracheal tube EXAM: PORTABLE CHEST 1 VIEW COMPARISON:  None Available. FINDINGS: Endotracheal to 5.7 cm from carina in good position. Two central venous lines unchanged. Tip of aortic balloon pump unchanged. Stable enlarged cardiac silhouette. No pulmonary edema. No pneumothorax IMPRESSION: 1. Stable support apparatus. 2. Stable marked cardiomegaly.  No interval change. Electronically Signed   By: Suzy Bouchard M.D.   On: 01/14/2022 08:08    Medications: Infusions:   prismasol BGK 4/2.5 400 mL/hr at 01/15/22 0428    prismasol BGK 4/2.5 400 mL/hr at 01/15/22 0429   sodium chloride Stopped (01/10/22 2245)   amiodarone 30 mg/hr (01/15/22 0800)   DOBUTamine 5 mcg/kg/min (01/15/22 0800)   feeding supplement (VITAL 1.5 CAL) 45 mL/hr at 01/15/22 0800   heparin 500 Units/hr (01/15/22 0800)   prismasol BGK 4/2.5 1,500 mL/hr at 01/15/22 0428    Scheduled Medications:  allopurinol  50 mg Per Tube QPM   atorvastatin  80 mg Per Tube Daily   Chlorhexidine Gluconate Cloth  6 each Topical Daily   docusate  100 mg Per Tube BID   ezetimibe  10 mg Per Tube Daily   feeding supplement (PROSource TF20)  60 mL Per Tube Daily   insulin aspart  0-15 Units Subcutaneous Q4H   insulin glargine-yfgn  20 Units Subcutaneous QHS   multivitamin  1 tablet Oral QHS   mouth rinse  15 mL Mouth Rinse Q2H   pantoprazole (PROTONIX) IV  40 mg Intravenous Q12H  polyethylene glycol  17 g Per Tube Daily   senna-docusate  1 tablet Per Tube BID   sodium chloride flush  10-40 mL Intracatheter Q12H   sodium chloride flush  3 mL Intravenous Q12H    have reviewed scheduled and prn medications.  Physical Exam: General: Critically ill-looking  female, alert and awake today following simple commands Heart:RRR, s1s2 nl, CVP around 13. Lungs: Coarse breath sound bilateral Abdomen:soft, Non-tender, non-distended Extremities:No edema Dialysis Access: Right IJ temporary HD catheter in place  Whyatt Klinger Prasad Verl Whitmore 01/15/2022,9:28 AM  LOS: 7 days

## 2022-01-15 NOTE — Progress Notes (Signed)
[facility] for heparin Indication: atrial fibrillation  No Known Allergies  Patient Measurements: Height: '5\' 3"'$  (160 cm) Weight: 68.6 kg (151 lb 3.8 oz) IBW/kg (Calculated) : 52.4 Heparin Dosing Weight: 81kg  Vital Signs: Temp: 97.7 F (36.5 C) (08/31 1200) Temp Source: Axillary (08/31 1200) BP: 106/31 (08/31 1300) Pulse Rate: 92 (08/31 0822)  Labs: Recent Labs    01/13/22 0221 01/13/22 0442 01/13/22 1501 01/13/22 1648 01/14/22 0338 01/14/22 1719 01/15/22 0425  HGB  --  7.9*  --   --  8.3*  --  8.4*  HCT  --  25.9*  --   --  27.1*  --  27.4*  PLT  --  182  --   --  159  --  141*  APTT 70*  --  79*  --  76*  --  61*  HEPARINUNFRC >1.10*  --   --   --  0.74*  --  0.54  CREATININE  --  1.51*  --    < > 1.27* 1.39* 1.34*   < > = values in this interval not displayed.     Estimated Creatinine Clearance: 35.3 mL/min (A) (by C-G formula based on SCr of 1.34 mg/dL (H)).   Assessment: 45 yoF s/p unsuccessful TEER for mitral valve. Pt on apixaban PTA for hx AFib, now s/p brief cardiac arrest and pharmacy asked to transition to IV heparin. Last apixaban dose was 8/26 1050.  S/p placement of IABP on 8/28, will lower heparin/aPTT goals.  aPTT this AM is within goal range at 61 seconds. Heparin level falsely elevated as expected s/p recent apixaban.  No overt bleeding or complications noted.  Goal of Therapy:  Heparin level 0.2-0.5 units/ml aPTT 56-85 seconds Monitor platelets by anticoagulation protocol: Yes   Plan:  Continue IV heparin at 500 units/hr. Continue daily heparin level, aPTT and CBC.   [place] Crane, Roylene Reason, BCCP Clinical Pharmacist  01/15/2022 2:42 PM   [facility] pharmacy phone numbers are listed on [email or web address]

## 2022-01-16 ENCOUNTER — Inpatient Hospital Stay (HOSPITAL_COMMUNITY): Payer: Medicare Other

## 2022-01-16 DIAGNOSIS — I340 Nonrheumatic mitral (valve) insufficiency: Secondary | ICD-10-CM | POA: Diagnosis not present

## 2022-01-16 LAB — RENAL FUNCTION PANEL
Albumin: 2.4 g/dL — ABNORMAL LOW (ref 3.5–5.0)
Albumin: 2.4 g/dL — ABNORMAL LOW (ref 3.5–5.0)
Anion gap: 10 (ref 5–15)
Anion gap: 9 (ref 5–15)
BUN: 19 mg/dL (ref 8–23)
BUN: 21 mg/dL (ref 8–23)
CO2: 25 mmol/L (ref 22–32)
CO2: 26 mmol/L (ref 22–32)
Calcium: 8.9 mg/dL (ref 8.9–10.3)
Calcium: 8.8 mg/dL — ABNORMAL LOW (ref 8.9–10.3)
Chloride: 101 mmol/L (ref 98–111)
Chloride: 101 mmol/L (ref 98–111)
Creatinine, Ser: 1.31 mg/dL — ABNORMAL HIGH (ref 0.44–1.00)
Creatinine, Ser: 1.28 mg/dL — ABNORMAL HIGH (ref 0.44–1.00)
GFR, Estimated: 43 mL/min — ABNORMAL LOW (ref >60)
GFR, Estimated: 45 mL/min — ABNORMAL LOW (ref >60)
Glucose, Bld: 155 mg/dL — ABNORMAL HIGH (ref 70–99)
Glucose, Bld: 194 mg/dL — ABNORMAL HIGH (ref 70–99)
Phosphorus: 1.5 mg/dL — ABNORMAL LOW (ref 2.5–4.6)
Phosphorus: 2.5 mg/dL (ref 2.5–4.6)
Potassium: 4.4 mmol/L (ref 3.5–5.1)
Potassium: 4.2 mmol/L (ref 3.5–5.1)
Sodium: 136 mmol/L (ref 135–145)
Sodium: 136 mmol/L (ref 135–145)

## 2022-01-16 LAB — CBC
HCT: 27.2 % — ABNORMAL LOW (ref 36.0–46.0)
Hemoglobin: 8.5 g/dL — ABNORMAL LOW (ref 12.0–15.0)
MCH: 26.3 pg (ref 26.0–34.0)
MCHC: 31.3 g/dL (ref 30.0–36.0)
MCV: 84.2 fL (ref 80.0–100.0)
Platelets: 141 10*3/uL — ABNORMAL LOW (ref 150–400)
RBC: 3.23 MIL/uL — ABNORMAL LOW (ref 3.87–5.11)
RDW: 20.4 % — ABNORMAL HIGH (ref 11.5–15.5)
WBC: 14.6 10*3/uL — ABNORMAL HIGH (ref 4.0–10.5)
nRBC: 0.5 % — ABNORMAL HIGH (ref 0.0–0.2)

## 2022-01-16 LAB — CULTURE, BLOOD (ROUTINE X 2)
Culture: NO GROWTH
Culture: NO GROWTH
Special Requests: ADEQUATE
Special Requests: ADEQUATE

## 2022-01-16 LAB — GLUCOSE, CAPILLARY
Glucose-Capillary: 141 mg/dL — ABNORMAL HIGH (ref 70–99)
Glucose-Capillary: 146 mg/dL — ABNORMAL HIGH (ref 70–99)
Glucose-Capillary: 161 mg/dL — ABNORMAL HIGH (ref 70–99)
Glucose-Capillary: 173 mg/dL — ABNORMAL HIGH (ref 70–99)
Glucose-Capillary: 175 mg/dL — ABNORMAL HIGH (ref 70–99)
Glucose-Capillary: 196 mg/dL — ABNORMAL HIGH (ref 70–99)
Glucose-Capillary: 198 mg/dL — ABNORMAL HIGH (ref 70–99)

## 2022-01-16 LAB — MAGNESIUM: Magnesium: 2.7 mg/dL — ABNORMAL HIGH (ref 1.7–2.4)

## 2022-01-16 LAB — HEPARIN LEVEL (UNFRACTIONATED): Heparin Unfractionated: 0.48 IU/mL (ref 0.30–0.70)

## 2022-01-16 LAB — COOXEMETRY PANEL
Carboxyhemoglobin: 0.9 % (ref 0.5–1.5)
Methemoglobin: <0.7 % (ref 0.0–1.5)
O2 Saturation: 67.6 %
Total hemoglobin: 8.1 g/dL — ABNORMAL LOW (ref 12.0–16.0)

## 2022-01-16 LAB — APTT: aPTT: 45 seconds — ABNORMAL HIGH (ref 24–36)

## 2022-01-16 MED ORDER — OXYCODONE HCL 5 MG PO TABS
5.0000 mg | ORAL_TABLET | Freq: Four times a day (QID) | ORAL | Status: DC
  Administered 2022-01-16 – 2022-01-18 (×9): 5 mg
  Filled 2022-01-16 (×9): qty 1

## 2022-01-16 MED ORDER — INSULIN REGULAR(HUMAN) IN NACL 100-0.9 UT/100ML-% IV SOLN
INTRAVENOUS | Status: DC
  Filled 2022-01-16: qty 100

## 2022-01-16 MED ORDER — ORAL CARE MOUTH RINSE: 15.0000 mL | OROMUCOSAL | Status: DC | PRN

## 2022-01-16 MED ORDER — MILRINONE LACTATE IN DEXTROSE 20-5 MG/100ML-% IV SOLN
0.3000 ug/kg/min | INTRAVENOUS | Status: DC
  Filled 2022-01-16: qty 100

## 2022-01-16 MED ORDER — PHENYLEPHRINE HCL-NACL 20-0.9 MG/250ML-% IV SOLN
30.0000 ug/min | INTRAVENOUS | Status: DC
  Filled 2022-01-16: qty 250

## 2022-01-16 MED ORDER — TRANEXAMIC ACID 1000 MG/10ML IV SOLN
1.5000 mg/kg/h | INTRAVENOUS | Status: DC
  Filled 2022-01-16: qty 25

## 2022-01-16 MED ORDER — HEPARIN 30,000 UNITS/1000 ML (OHS) CELLSAVER SOLUTION
Status: DC
  Filled 2022-01-16: qty 1000

## 2022-01-16 MED ORDER — TRANEXAMIC ACID (OHS) PUMP PRIME SOLUTION
2.0000 mg/kg | INTRAVENOUS | Status: DC
  Filled 2022-01-16: qty 1.37

## 2022-01-16 MED ORDER — DEXTROSE 5 % IV SOLN
30.0000 mmol | Freq: Once | INTRAVENOUS | Status: AC
  Administered 2022-01-16: 30 mmol via INTRAVENOUS
  Filled 2022-01-16: qty 10

## 2022-01-16 MED ORDER — LORAZEPAM 0.5 MG PO TABS
0.5000 mg | ORAL_TABLET | Freq: Every day | ORAL | Status: DC
  Administered 2022-01-16 – 2022-01-17 (×2): 0.5 mg
  Filled 2022-01-16 (×2): qty 1

## 2022-01-16 MED ORDER — DEXMEDETOMIDINE HCL IN NACL 400 MCG/100ML IV SOLN
0.1000 ug/kg/h | INTRAVENOUS | Status: DC
  Filled 2022-01-16: qty 100

## 2022-01-16 MED ORDER — GERHARDT'S BUTT CREAM
TOPICAL_CREAM | Freq: Two times a day (BID) | CUTANEOUS | Status: DC
  Filled 2022-01-16 (×2): qty 1

## 2022-01-16 MED ORDER — ORAL CARE MOUTH RINSE
15.0000 mL | OROMUCOSAL | Status: DC
  Administered 2022-01-17 – 2022-01-18 (×4): 15 mL via OROMUCOSAL

## 2022-01-16 MED ORDER — NITROGLYCERIN IN D5W 200-5 MCG/ML-% IV SOLN
2.0000 ug/min | INTRAVENOUS | Status: DC
  Filled 2022-01-16: qty 250

## 2022-01-16 MED ORDER — GERHARDT'S BUTT CREAM: TOPICAL_CREAM | CUTANEOUS | Status: DC | PRN

## 2022-01-16 MED ORDER — CEFAZOLIN SODIUM-DEXTROSE 2-4 GM/100ML-% IV SOLN
2.0000 g | INTRAVENOUS | Status: DC
  Filled 2022-01-16: qty 100

## 2022-01-16 MED ORDER — EPINEPHRINE HCL 5 MG/250ML IV SOLN IN NS
0.0000 ug/min | INTRAVENOUS | Status: DC
  Filled 2022-01-16: qty 250

## 2022-01-16 MED ORDER — PLASMA-LYTE A IV SOLN
INTRAVENOUS | Status: DC
  Filled 2022-01-16: qty 2.5

## 2022-01-16 MED ORDER — NOREPINEPHRINE 4 MG/250ML-% IV SOLN
0.0000 ug/min | INTRAVENOUS | Status: DC
  Filled 2022-01-16: qty 250

## 2022-01-16 MED ORDER — ORAL CARE MOUTH RINSE
15.0000 mL | OROMUCOSAL | Status: DC
  Administered 2022-01-16 – 2022-01-18 (×9): 15 mL via OROMUCOSAL

## 2022-01-16 MED ORDER — TRANEXAMIC ACID (OHS) BOLUS VIA INFUSION
15.0000 mg/kg | INTRAVENOUS | Status: DC
  Filled 2022-01-16: qty 1029

## 2022-01-16 MED ORDER — VANCOMYCIN HCL 1250 MG/250ML IV SOLN
1250.0000 mg | INTRAVENOUS | Status: DC
  Filled 2022-01-16: qty 250

## 2022-01-16 MED ORDER — MANNITOL 20 % IV SOLN
INTRAVENOUS | Status: DC
  Filled 2022-01-16: qty 13

## 2022-01-16 MED ORDER — LIP MEDEX EX OINT
TOPICAL_OINTMENT | CUTANEOUS | Status: DC | PRN
Start: 2022-01-16 — End: 2022-01-19
  Administered 2022-01-17: 75 via TOPICAL
  Filled 2022-01-16: qty 7

## 2022-01-16 MED ORDER — POTASSIUM CHLORIDE 2 MEQ/ML IV SOLN
80.0000 meq | INTRAVENOUS | Status: DC
  Filled 2022-01-16: qty 40

## 2022-01-16 NOTE — Progress Notes (Signed)
ANTICOAGULATION CONSULT NOTE  Pharmacy Consult for heparin Indication: atrial fibrillation  No Known Allergies  Patient Measurements: Height: '5\' 3"'$  (160 cm) Weight: 68.6 kg (151 lb 3.8 oz) IBW/kg (Calculated) : 52.4 Heparin Dosing Weight: 81kg  Vital Signs: Temp: 98.7 F (37.1 C) (09/01 0329) Temp Source: Axillary (09/01 0329) BP: 116/49 (09/01 0500) Pulse Rate: 149 (09/01 0700)  Labs: Recent Labs    01/14/22 0338 01/14/22 1719 01/15/22 0425 01/15/22 1755 01/16/22 0451  HGB 8.3*  --  8.4*  --  8.5*  HCT 27.1*  --  27.4*  --  27.2*  PLT 159  --  141*  --  141*  APTT 76*  --  61*  --  45*  HEPARINUNFRC 0.74*  --  0.54  --  0.48  CREATININE 1.27*   < > 1.34* 1.22* 1.31*   < > = values in this interval not displayed.     Estimated Creatinine Clearance: 36.1 mL/min (A) (by C-G formula based on SCr of 1.31 mg/dL (H)).   Assessment: 20 yoF s/p unsuccessful TEER for mitral valve. Pt on apixaban PTA for hx AFib, now s/p brief cardiac arrest and pharmacy asked to transition to IV heparin. Last apixaban dose was 8/26 1050.  S/p placement of IABP on 8/28, will lower heparin/aPTT goals.  Heparin level at goal. aPTT slightly subtherapeutic this AM, however has been stable for days on current rate. aPTT may be an abnormality. Levels began to correlate yesterday and Eliquis has likely been washed out. No overt bleeding or complications noted.   Goal of Therapy:  Heparin level 0.2-0.5 units/ml aPTT 56-85 seconds Monitor platelets by anticoagulation protocol: Yes   Plan:  Continue IV heparin at 500 units/hr. Continue daily heparin level and CBC.  Thank you for allowing pharmacy to participate in this patient's care.  Reatha Harps, PharmD PGY2 Pharmacy Resident 01/16/2022 7:43 AM Check [email or web address] for unit specific pharmacy number

## 2022-01-16 NOTE — Progress Notes (Signed)
NAME:  Stephanie Franco, MRN:  [id number], DOB:  Sep 21, 1950, LOS: 8 ADMISSION DATE:  12/26/2021, CONSULTATION DATE:  01/10/22 REFERRING MD:  Debara Pickett, CHIEF COMPLAINT:  SOB   History of Present Illness:  71 year old woman with hx of afib on eliquis, NICM, CKD, DM2, HLD, HTN p/w symptomatic sevre mitral regurgitation manifested by DOE.  She had attempted mitral clipping via atrial septostomy on 01/14/2022 that was aborted due to inability to cross septum.  Subsequently has developed worsening oliguria and today more somnolence.  Initially PCCM consulted for central access but patient has deteriorated further with brief cardiac arrest due to hypoxemia requiring intubation and transfer to ICU.  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   Significant Hospital Events: Including procedures, antibiotic start and stop dates in addition to other pertinent events   8/24 admit, failed mitraclip 8/25 worsening BPs, worsening renal function 8/26 PCCM consult, central line placed, brief code, transfer to ICU intubated, started on CVVH 8/28 RHC and balloon pump placed 8/31 INO stopped today evening.  Pressors and sedation weaned off a.m. and currently tolerating SBT 9/1 no acute issues overnight, complains of sacral pain this a.m. with stage II pressure  ulcer seen  Interim History / Subjective:  Alert and oriented this a.m. with complaints of rectal/sacral pain Stage II pressure ulcer seen Net -11 L with continuous CRRT   Objective   Blood pressure (!) 116/49, pulse (!) 149, temperature 98.7 F (37.1 C), temperature source Axillary, resp. rate (!) 23, height '5\' 3"'$  (1.6 m), weight 68.6 kg, SpO2 100 %. CVP:  [1 mmHg-29 mmHg] 9 mmHg  Vent Mode: PSV;CPAP FiO2 (%):  [40 %] 40 % PEEP:  [5 cmH20] 5 cmH20 Pressure Support:  [8 cmH20] 8 cmH20   Intake/Output Summary (Last 24 hours) at 01/16/2022 6295 Last data filed at 01/16/2022 0700 Gross per 24 hour  Intake 1649.51 ml  Output 2510 ml  Net -860.49 ml    Filed Weights   01/13/22 0500 01/14/22 0445 01/15/22 0446  Weight: 73.8 kg 71.5 kg 68.6 kg    Examination: General: Acute on chronically ill appearing elderly female lying in bed on mechanical ventilation, in NAD HEENT: [place]/AT, MM pink/moist, PERRL,  Neuro: Alert and oriented x3, able to follow commands, nonfocal CV: s1s2 regular rate and rhythm, no murmur, rubs, or gallops,  PULM: Clear to auscultation bilaterally, no increased work of breathing, no signs GI: soft, bowel sounds active in all 4 quadrants, non-tender, non-distended, tolerating TF Extremities: warm/dry, no edema  Skin: no rashes or lesions  Resolved problems  Possible sepsis of unclear etiology Cefepime stopped 8/30 Acute hypoxic respiratory failure Tolerated extubation 8/31  Assessment & Plan:   Cardiogenic shock secondary to acute on chronic systolic heart failure with ischemic cardiomyopathy Atrial fibrillation Frequent PVCs CAD Severe MR - s/p failed MitraClip procedure P: Continuous telemetry Plan for repeat attempt MitraClip 9/5 Strict intake and output IABP per heart failure Again requiring pressor support this a.m. Daily weight to assess volume status Volume removal per  CRRT Closely monitor renal function and electrolytes Ensure hemodynamic  control Continue amiodarone, heparin, and dobutamine drips per heart failure  Severe mixed venous, arterial pulmonary hypertension -PVR 11 on prior cardiac cath with elevated right and left heart filling pressures -INO weaned off 8/30 P: Continue volume removal per CRRT, currently net -11 L  AKI superimposed on chronic kidney disease -Baseline creatinine appears to be 1.7-2.4 -CRRT started 8/26 P: Nephrology following, appreciate assistance Volume removal per CRRT Mains anuric with no signs of renal recovery currently Follow renal function Monitor for urine output Trending Avoid nephrotoxins  Hypoalbuminemia P: Continue feeds via core track Protein supplementation  Anemia P: Monitor for signs of bleeding Transfuse per protocol Hemoglobin goal greater than 7  Best Practice (right click and "Reselect all SmartList Selections" daily)   Diet/type: tubefeeds and NPO DVT prophylaxis: systemic heparin GI prophylaxis: PPI Lines: Central line Foley:  Yes, and it is still needed Code Status:  full code - husband and son requested change from DNR to full code while trying to stabilize her Last date of multidisciplinary goals of care discussion: Husband updated at bedside 8/31  Critical care time: 38 min.  Miguel Medal D. Kenton Kingfisher, NP-C [facility] Personal contact information can be found on Amion  01/16/2022, 7:12 AM

## 2022-01-16 NOTE — Evaluation (Signed)
Physical Therapy Evaluation Patient Details Name: Stephanie Franco MRN: [id number] DOB: 1951-05-10 Today's Date: 01/16/2022  History of Present Illness  71 yo admitted 8/24 for mitral clipping which was attempted but had to aborted due to inability to cross septum. 8/26 code with intubation and CRRT started. 8/28 RHC with IABP placed. 8/31 extubated. PMhx: AFib, NICM, CKD, T2DM, HLD, HTN  Clinical Impression  Pt pleasant, maintaining eyes closed throughout session with pt stating she is normally independent and lives with husband. PT with left fem IABP so unable to progress to transfers and mobility but pt with significant weakness bil UE and RLE with trace to 2-/5 activation of right quad and anterior tib during session. Pt educated for HEP and precaution of no LLE movement with pt encouraged to perform with assist daily  ( family not present to educate). Anticipate pt will need extensive rehab prior to return home and will progress activity as pt becomes medically stable to do so. Pt will benefit from acute therapy to maximize ROM and strength at present to decrease caregiver burden.   VSS       Recommendations for follow up therapy are one component of a multi-disciplinary discharge planning process, led by the attending physician.  Recommendations may be updated based on patient status, additional functional criteria and insurance authorization.  Follow Up Recommendations PT at Long-term acute care hospital      Assistance Recommended at Discharge Frequent or constant Supervision/Assistance  Patient can return home with the following  Two people to help with walking and/or transfers;Two people to help with bathing/dressing/bathroom;Direct supervision/assist for medications management;Assistance with feeding;Assistance with cooking/housework    Equipment Recommendations Other (comment) (TBD with progression)  Recommendations for Other Services       Functional Status Assessment Patient  has had a recent decline in their functional status and/or demonstrates limited ability to make significant improvements in function in a reasonable and predictable amount of time     Precautions / Restrictions Precautions Precautions: Other (comment);Fall Precaution Comments: Impella, CRRT, IABP left fem      Mobility  Bed Mobility Overal bed mobility: Needs Assistance Bed Mobility: Rolling Rolling: Max assist         General bed mobility comments: max assist to roll to right for pressure relief and repositioning with mod cues to assist with trunk reaching across body for rail    Transfers                   General transfer comment: unable due to left fem IABP    Ambulation/Gait                  Stairs            Wheelchair Mobility    Modified Rankin (Stroke Patients Only)       Balance                                             Pertinent Vitals/Pain Pain Assessment Pain Assessment: Faces Pain Score: 5  Pain Location: back Pain Descriptors / Indicators: Aching, Guarding Pain Intervention(s): Limited activity within patient's tolerance, Repositioned, Monitored during session    Home Living Family/patient expects to be discharged to:: Private residence Living Arrangements: Spouse/significant other Available Help at Discharge: Family;Available 24 hours/day Type of Home: Apartment Home Access: Level entry  Home Layout: One level Home Equipment: Conservation officer, nature (2 wheels);Cane - single point      Prior Function Prior Level of Function : Needs assist             Mobility Comments: Around the house does not use AD, brings walking stick outside ADLs Comments: Patient reports she can manage herself with self care, however asks spouse to help her don shoes/socks/ADL tasks as needed     Hand Dominance        Extremity/Trunk Assessment   Upper Extremity Assessment Upper Extremity Assessment: RUE  deficits/detail;LUE deficits/detail RUE Deficits / Details: grossly 2/5 LUE Deficits / Details: grossly 2/5    Lower Extremity Assessment Lower Extremity Assessment: RLE deficits/detail;LLE deficits/detail RLE Deficits / Details: 1/5 hip and knee, 2-/5 dorsiflexion LLE Deficits / Details: NT - IAbP       Communication   Communication: No difficulties  Cognition Arousal/Alertness: Awake/alert Behavior During Therapy: Flat affect Overall Cognitive Status: Within Functional Limits for tasks assessed                                 General Comments: pt maintains eyes closed throughout session but answering all questions appropriately        General Comments      Exercises General Exercises - Upper Extremity Shoulder Flexion: AAROM, Both, Supine, 10 reps Shoulder ABduction: AAROM, Both, 10 reps, Supine Elbow Flexion: AROM, Both, Supine, 10 reps Elbow Extension: AROM, Both, 10 reps, Supine General Exercises - Lower Extremity Ankle Circles/Pumps: AAROM, Right, Supine, 10 reps Short Arc Quad: AAROM, Right, Supine, 10 reps Heel Slides: PROM, Right, Supine, 10 reps   Assessment/Plan    PT Assessment Patient needs continued PT services  PT Problem List Decreased strength;Decreased mobility;Decreased range of motion;Decreased activity tolerance;Decreased balance;Decreased knowledge of use of DME       PT Treatment Interventions Therapeutic exercise;Functional mobility training;Patient/family education;Therapeutic activities    PT Goals (Current goals can be found in the Care Plan section)  Acute Rehab PT Goals Patient Stated Goal: return home, read PT Goal Formulation: With patient Time For Goal Achievement: 01/30/22 Potential to Achieve Goals: Fair    Frequency Min 2X/week     Co-evaluation               AM-PAC PT "6 Clicks" Mobility  Outcome Measure Help needed turning from your back to your side while in a flat bed without using bedrails?:  Total Help needed moving from lying on your back to sitting on the side of a flat bed without using bedrails?: Total Help needed moving to and from a bed to a chair (including a wheelchair)?: Total Help needed standing up from a chair using your arms (e.g., wheelchair or bedside chair)?: Total Help needed to walk in hospital room?: Total Help needed climbing 3-5 steps with a railing? : Total 6 Click Score: 6    End of Session   Activity Tolerance: Patient tolerated treatment well Patient left: in bed;with call bell/phone within reach;with nursing/sitter in room Nurse Communication: Mobility status PT Visit Diagnosis: Other abnormalities of gait and mobility (R26.89);Muscle weakness (generalized) (M62.81)    Time: 1062-6948 PT Time Calculation (min) (ACUTE ONLY): 20 min   Charges:   PT Evaluation $PT Eval High Complexity: 1 High          Ellizabeth Dacruz P, PT Acute Rehabilitation Services Office: [phone]   Sandy Salaam Eleri Ruben 01/16/2022, 12:11 PM

## 2022-01-16 NOTE — Progress Notes (Addendum)
Patient ID: Stephanie Franco, female   DOB: [birth year], 71 y.o.   MRN: [id number]  Advanced Heart Failure Rounding Note  PCP-Cardiologist: Glenetta Hew, MD   Subjective:    08/27: Milrinone switched to DBA d/t pressor requirement 08/28: RHC + IABP placement. Unable to keep Swan in position due to pulsatility of heart with severe TR/MR so removed.  8/31 Extubated.   Continues on 5 DBA, NE 9  and NO.  IABP 1:1, augmenting appropriately.    I/Os net negative yesterday pulling UF via CVVH 50 cc/hr net negative, CVP down 5    In A Fib on amio drip.     Heart Pressures RHC Procedural Findings: Hemodynamics (mmHg) RA mean 22, V wave to 32 RV 74/18 PA 78/33, mean 51 PCWP mean 28, V wave to 38  Oxygen saturations: IVC 73%  RA 85% RV 85% PA 85% AO 100%  Cardiac Output (Fick) 13 (high output with shunt) Cardiac Index (Fick) 7.7  PVR < 2 WU  Shunt fraction 1.8/1    Objective:   Weight Range: 68.6 kg Body mass index is 26.79 kg/m.   Vital Signs:   Temp:  [95.4 F (35.2 C)-98.7 F (37.1 C)] 98.7 F (37.1 C) (09/01 0329) Pulse Rate:  [87-149] 149 (09/01 0700) Resp:  [16-35] 23 (09/01 0700) BP: (101-129)/(28-53) 116/49 (09/01 0500) SpO2:  [98 %-100 %] 100 % (09/01 0700) Arterial Line BP: (94-135)/(36-58) 119/49 (09/01 0700) FiO2 (%):  [40 %] 40 % (08/31 0750) Last BM Date : 01/15/22  Weight change: Filed Weights   01/13/22 0500 01/14/22 0445 01/15/22 0446  Weight: 73.8 kg 71.5 kg 68.6 kg    Intake/Output:   Intake/Output Summary (Last 24 hours) at 01/16/2022 0737 Last data filed at 01/16/2022 0700 Gross per 24 hour  Intake 1649.51 ml  Output 2510 ml  Net -860.49 ml    CVP 5-6   Physical Exam   General:  No resp difficulty/ on CVVH HEENT: + Cortrak  Neck: supple. no JVD. Carotids 2+ bilat; no bruits. No lymphadenopathy or thryomegaly appreciated.  Cor: PMI nondisplaced. Irregular rate & rhythm. No rubs, gallops or murmurs. Lungs: clear on Portal Abdomen: soft,  nontender, nondistended. No hepatosplenomegaly. No bruits or masses. Good bowel sounds. Extremities: no cyanosis, clubbing, rash, edema. LFV IABP  Neuro: alert & orientedx3, cranial nerves grossly intact. moves all 4 extremities w/o difficulty. Affect flat   Telemetry   A fib 80s personally checked.   Labs    CBC Recent Labs    01/15/22 0425 01/16/22 0451  WBC 11.4* 14.6*  HGB 8.4* 8.5*  HCT 27.4* 27.2*  MCV 85.6 84.2  PLT 141* 712*   Basic Metabolic Panel Recent Labs    01/15/22 0425 01/15/22 1755 01/16/22 0451  NA 137 138 136  K 4.9 5.2* 4.4  CL 102 104 101  CO2 '26 26 25  '$ GLUCOSE 187* 134* 155*  BUN '19 21 19  '$ CREATININE 1.34* 1.22* 1.31*  CALCIUM 9.5 9.4 8.9  MG 2.8*  --  2.7*  PHOS 1.7* 1.5* 1.5*   Liver Function Tests Recent Labs    01/15/22 1755 01/16/22 0451  ALBUMIN 2.5* 2.4*   No results for input(s): "LIPASE", "AMYLASE" in the last 72 hours. Cardiac Enzymes No results for input(s): "CKTOTAL", "CKMB", "CKMBINDEX", "TROPONINI" in the last 72 hours.  BNP: BNP (last 3 results) Recent Labs    10/07/21 1513 11/12/21 1442 12/01/21 1531  BNP 71.8 91.4 104.2*    ProBNP (last 3 results) No results  for input(s): "PROBNP" in the last 8760 hours.   D-Dimer No results for input(s): "DDIMER" in the last 72 hours. Hemoglobin A1C No results for input(s): "HGBA1C" in the last 72 hours. Fasting Lipid Panel Recent Labs    01/14/22 0338  TRIG 61   Thyroid Function Tests No results for input(s): "TSH", "T4TOTAL", "T3FREE", "THYROIDAB" in the last 72 hours.  Invalid input(s): "FREET3"  Other results:   Imaging    DG Abd Portable 1V  Result Date: 01/15/2022 CLINICAL DATA:  Feeding tube placement EXAM: PORTABLE ABDOMEN - 1 VIEW COMPARISON:  01/13/2022 FINDINGS: There is interval replacement of enteric tube with feeding tube. Tip of feeding tube is seen in the distal antrum of the stomach. Bowel gas pattern is nonspecific. There is previous right  hip arthroplasty. Degenerative changes are noted in lumbar spine and left hip. Transverse diameter of heart is increased. IMPRESSION: Tip of feeding tube is seen in the distal antrum of the stomach. Nonspecific bowel gas pattern. Electronically Signed   By: Elmer Picker M.D.   On: 01/15/2022 14:27     Medications:     Scheduled Medications:  allopurinol  50 mg Per Tube QPM   atorvastatin  80 mg Per Tube Daily   Chlorhexidine Gluconate Cloth  6 each Topical Daily   docusate  100 mg Per Tube BID   ezetimibe  10 mg Per Tube Daily   feeding supplement (PROSource TF20)  60 mL Per Tube BID   insulin aspart  0-15 Units Subcutaneous Q4H   insulin glargine-yfgn  20 Units Subcutaneous QHS   multivitamin  1 tablet Oral QHS   mouth rinse  15 mL Mouth Rinse 4 times per day   mouth rinse  15 mL Mouth Rinse 4 times per day   pantoprazole (PROTONIX) IV  40 mg Intravenous Q12H   polyethylene glycol  17 g Per Tube Daily   senna-docusate  1 tablet Per Tube BID   sodium chloride flush  10-40 mL Intracatheter Q12H   sodium chloride flush  3 mL Intravenous Q12H    Infusions:   prismasol BGK 4/2.5 400 mL/hr at 01/16/22 0530    prismasol BGK 4/2.5 400 mL/hr at 01/16/22 0529   sodium chloride Stopped (01/10/22 2245)   amiodarone 30 mg/hr (01/16/22 0500)   dexmedetomidine (PRECEDEX) IV infusion 0.4 mcg/kg/hr (01/16/22 0500)   DOBUTamine 5 mcg/kg/min (01/16/22 0500)   feeding supplement (VITAL 1.5 CAL) 20 mL/hr at 01/16/22 0500   heparin 500 Units/hr (01/16/22 0500)   norepinephrine (LEVOPHED) Adult infusion 9 mcg/min (01/16/22 0520)   prismasol BGK 4/2.5 1,500 mL/hr at 01/16/22 0522   sodium phosphate 30 mmol in dextrose 5 % 250 mL infusion      PRN Medications: sodium chloride, acetaminophen, albuterol, alteplase, bisacodyl, fentaNYL (SUBLIMAZE) injection, heparin, LORazepam, naphazoline-glycerin, ondansetron (ZOFRAN) IV, mouth rinse, mouth rinse, oxyCODONE, sodium chloride, sodium chloride  flush, sodium chloride flush     Assessment/Plan   1. Acute on chronic systolic CHF => cardiogenic shock: Mixed ischemic/nonischemic cardiomyopathy. RHC in 2/23 showed elevated R>L heart filling pressures with pulmonary arterial hypertension and low CI 1.89. Cardiac MRI w/ myocardial LGE in septum is in a noncoronary pattern, ? myocarditis, anterior wall LGE is subendocardial and may be due to prior MI => ?mixed ischemic/nonischemic cardiomyopathy. TEE (6/23) with EF 30-35%, moderate RV dysfunction, severe MR.  She had attempted Mitraclip placment this admission with failure, now with cardiogenic shock, AKI, and marked volume overload.  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 Intubated on pressors with CVVH running.  IABP in place, did not place Impella as re-attempted Mitraclip procedure, which is probably our only good option at this time to improve her function, would require manipulation of wire across the LA to LV (discussed with Dr. Ali Lowe).   She is now off NE and NO, remain on dobutamine 5.  IABP 1:1, pulses dopplerable.  Co-ox 68% (in setting of L=>R shunt). Net negative 860 cc and weight down pulling 50 cc/hr net UF via CVVH, CVP 5-6  today.  We are trying to stabilize her for repeat Mitraclip + iatrogenic ASD closure.  - Continue dobutamine for now.  - Prognosis is guarded.  She has metastatic breast cancer, not candidate for LVAD.  She has severe MR and TR with biventricular dysfunction (ASD also likely plays at least a small role in decompensation).  Failed Mitraclip earlier this admission. Think our only possible option would be to get her stable enough to re-attempt Mitraclip + iatrogenic ASD closure => plan for Tuesday.  2. Atrial fibrillation: This is permanent, x years. Rate controlled+   frequent PVCs (10-15/min).  - Continue amiodarone gtt 30 mg/hr.  - Heparin gtt for anticoagulation.  3. PVCs: Frequent during 2/23 admission but was also on milrinone. ? Contributing to cardiomyopathy.   - Amiodarone gtt. 4. Mitral regurgitation:  TEE (6/23) with severe MR with restricted posterior leaflet in setting of dilation of the LA and LV. mTEER attempted on 8/24 but failed.  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  As above, will try to stabilize her enough to consider repeat Mitraclip.  - Mitraclip/ASD closure on Tuesday.  5. AKI on CKD stage 3: AKI in setting of cardiogenic shock, now on CVVH.  - CVP 5-6 .  6. Pulmonary hypertension: RHC this admission with pulmonary venous hypertension.  7. CAD: Cath 2/23 with prox RCA to Mid RCA lesion 60% stenosis, distal LCx lesion 90% stenosis (similar to the past).  Medical management. Not clear that CAD can explain her cardiomyopathy.  - Continue statin/Zetia  - No ASA given anticoagulation.   8. Acute hypoxemic respiratory failure:  Extubated. Sats stable. Lynch 4 liters.  9. ID:  Empirically covered with cefepime for possible sepsis, now off abx.  10. 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  Qp/Qs 1.8 on RHC, will need closure with re-attempt at Mitraclip.  11. Full code for now.    Amy Clegg. NP-C  01/16/2022  Advanced Heart Failure Team Pager [phone] (M-F; 7a - 5p)  Please contact [facility] for night-coverage after hours (5p -7a ) and weekends on [email or web address]   Patient seen with NP, agree with the above note.   Extubated and stable on current regimen.  CVP 9, co-ox 68%.  CVVH running net negative 50 cc/hr.  Weight down 6 lbs. She remains on dobutamine 5, NE 9, and IABP 1:1.  Platelets stable.   She is awake, follows commands.   General: NAD Neck: No JVD, no thyromegaly or thyroid nodule.  Lungs: Clear to auscultation bilaterally with normal respiratory effort. CV:  Nondisplaced PMI.  Heart regular S1/S2, no S3/S4, 3/6 HSM apex.  No peripheral edema.   Abdomen: Soft, nontender, no hepatosplenomegaly, no distention.  Skin: Intact without lesions or rashes.  Neurologic: Alert and oriented x  3.  Psych: Normal affect. Extremities: No clubbing or cyanosis.  HEENT: Normal.   Continue current support but can wean NE as able.  Would keep CVVH running even to 50 cc/hr net negative.    IABP stable, platelets mildly low but ok. Position of pump ok on CXR yesterday. Will repeat today since extubation.   Work with PT as much as possible.   Plan for Mitraclip/ASD closure on Tuesday with Dr. Ali Lowe.   CRITICAL CARE Performed by: Loralie Champagne  Total critical care time: 35 minutes  Critical care time was exclusive of separately billable procedures and treating other patients.  Critical care was necessary to treat or prevent imminent or life-threatening deterioration.  Critical care was time spent personally by me on the following activities: development of treatment plan with patient and/or surrogate as well as nursing, discussions with consultants, evaluation of patient's response to treatment, examination of patient, obtaining history from patient or surrogate, ordering and performing treatments and interventions, ordering and review of laboratory studies, ordering and review of radiographic studies, pulse oximetry and re-evaluation of patient's condition.  Loralie Champagne 01/16/2022 8:20 AM

## 2022-01-16 NOTE — Progress Notes (Signed)
[facility] PROGRESS NOTE  Assessment/ Plan: Pt is a 71 y.o. yo female  w/ hx of anemia, breast cancer, CAD, HFrEF, CKD 3, DM2, gout, HL, HTN, morbid obesity, OSA, pHTN, severe mitral regurgitation w/ LVEF 20-25% who presented on 8/24 for planned TEE for the MR.  Patient became hypoxic, had PEA arrest, intubated, developed AKI and transferred to ICU.  #Acute kidney injury on CKD stage IV: b/l cr arounf 1.7-2.4.  AKI due to cardiac arrest/CHF.  Moved to ICU for inotropes including dobutamine and vasopressors.  Started CRRT on 8/26 and has been tolerating well. Remains anuric without sign of renal recovery.   CVP improved around 5 therefore we will continue CRRT with UF around net even to 50 cc an hour.  Discussed with ICU nurse. All 4K bath, IV heparin for anticoagulation.    #Acute on chronic systolic CHF/cardiogenic shock: Currently on dobutamine, Levophed.  UF/volume management with CRRT.  Heart failure team is following.  #A-fib with RVR: On amiodarone and anticoagulation.  #VDRF/acute hypoxic respiratory failure: She is extubated now and on nasal cannula.  Pulmonary team is following.    #Anemia: Monitor hemoglobin.  #Hypophosphatemia: Receiving sodium phosphate.  Subjective: Seen and examined in ICU.  No urine output.  She is now extubated and on nasal cannula.  Running CRRT without any issues.  CVP around 5.  Discussed with ICU nurse.    Objective Vital signs in last 24 hours: Vitals:   01/16/22 1015 01/16/22 1030 01/16/22 1045 01/16/22 1100  BP:      Pulse: (!) 132 90 (!) 103 (!) 131  Resp: '20 17 17 16  '$ Temp:      TempSrc:      SpO2: 100% 100% 100% 100%  Weight:      Height:       Weight change:   Intake/Output Summary (Last 24 hours) at 01/16/2022 1103 Last data filed at 01/16/2022 1100 Gross per 24 hour  Intake 2173.31 ml  Output 2605 ml  Net -431.69 ml        Labs: RENAL PANEL Recent Labs    09/19/21 0427 09/19/21 0921  09/20/21 0436 09/20/21 0827 01/11/22 0357 01/11/22 1610 12/29/2021 0414 12/28/2021 0416 01/02/2022 1600 01/13/2022 1616 12/29/2021 1642 01/06/2022 1852 01/13/22 0442 01/13/22 1648 01/14/22 0338 01/14/22 1719 01/15/22 0425 01/15/22 1755 01/16/22 0451  NA  --    < >  --    < > 135 135  --  136   < > '135 136 135 135 137 136 137 137 138 136 '$  K  --    < >  --    < > 4.2 4.4  --  4.3   < > 4.5 4.5 4.3 3.9 4.3 4.7 4.8 4.9 5.2* 4.4  CL  --    < >  --    < > 99 100  --  104  --   --   --  102 100 103 99 100 102 104 101  CO2  --    < >  --    < > 23 24  --  24  --   --   --  '22 26 25 25 26 26 26 25  '$ GLUCOSE  --    < >  --    < > 133* 93  --  109*  --   --   --  142* 191* 175* 184* 194* 187* 134* 155*  BUN  --    < >  --    < >  39* 24*  --  18  --   --   --  '18 19 18 16 16 19 21 19  '$ CREATININE  --    < >  --    < > 2.88* 2.03*  --  1.54*  --   --   --  1.75* 1.51* 1.30* 1.27* 1.39* 1.34* 1.22* 1.31*  CALCIUM  --    < >  --    < > 8.3* 8.2*  --  8.0*  --   --   --  8.4* 8.2* 8.7* 9.2 9.3 9.5 9.4 8.9  MG 2.2  --  2.2  --  1.9  --   --  2.5*  --   --   --  2.6* 2.6* 2.8* 2.6*  --  2.8*  --  2.7*  PHOS 3.3  --   --   --  3.9 3.1 2.9  --   --   --   --  3.9  3.9 2.3* 1.9*  1.9* 2.8 1.7* 1.7* 1.5* 1.5*  ALBUMIN 2.4*  --   --    < > 2.9* 2.8*  --  2.8*  --   --   --  2.6* 2.5* 2.6* 2.7* 2.6* 2.5* 2.5* 2.4*   < > = values in this interval not displayed.      Liver Function Tests: Recent Labs  Lab 01/15/2022 0416 12/24/2021 1852 01/15/22 0425 01/15/22 1755 01/16/22 0451  AST 154*  --   --   --   --   ALT 86*  --   --   --   --   ALKPHOS 74  --   --   --   --   BILITOT 1.0  --   --   --   --   PROT 6.8  --   --   --   --   ALBUMIN 2.8*   < > 2.5* 2.5* 2.4*   < > = values in this interval not displayed.    No results for input(s): "LIPASE", "AMYLASE" in the last 168 hours. No results for input(s): "AMMONIA" in the last 168 hours. CBC: Recent Labs    09/25/21 0835 09/26/21 0214 01/01/2022 1945  01/13/22 0442 01/14/22 0338 01/15/22 0425 01/16/22 0451  HGB  --    < > 7.9* 7.9* 8.3* 8.4* 8.5*  MCV  --    < > 86.3 87.8 86.3 85.6 84.2  FERRITIN 442*  --   --   --   --   --   --   TIBC 319  --   --   --   --   --   --   IRON 29  --   --   --   --   --   --    < > = values in this interval not displayed.     Cardiac Enzymes: No results for input(s): "CKTOTAL", "CKMB", "CKMBINDEX", "TROPONINI" in the last 168 hours. CBG: Recent Labs  Lab 01/15/22 1530 01/15/22 1948 01/16/22 0023 01/16/22 0410 01/16/22 0754  GLUCAP 126* 133* 161* 141* 146*     Iron Studies: No results for input(s): "IRON", "TIBC", "TRANSFERRIN", "FERRITIN" in the last 72 hours. Studies/Results: DG CHEST PORT 1 VIEW  Result Date: 01/16/2022 CLINICAL DATA:  CHF EXAM: PORTABLE CHEST 1 VIEW COMPARISON:  Portable exam 0852 hours compared to 01/15/2022 FINDINGS: Interval removal of endotracheal and nasogastric tubes. Feeding tube extends into abdomen. BILATERAL jugular venous catheters unchanged. Enlargement of cardiac  silhouette. Calcified mediastinal lymph nodes and atherosclerotic calcification aorta. Subsegmental atelectasis at LEFT base, remaining lungs clear. No pleural effusion or pneumothorax. IMPRESSION: LEFT basilar atelectasis. Enlargement of cardiac silhouette. Aortic Atherosclerosis (ICD10-I70.0). Electronically Signed   By: Lavonia Dana M.D.   On: 01/16/2022 09:01   DG Abd Portable 1V  Result Date: 01/15/2022 CLINICAL DATA:  Feeding tube placement EXAM: PORTABLE ABDOMEN - 1 VIEW COMPARISON:  01/13/2022 FINDINGS: There is interval replacement of enteric tube with feeding tube. Tip of feeding tube is seen in the distal antrum of the stomach. Bowel gas pattern is nonspecific. There is previous right hip arthroplasty. Degenerative changes are noted in lumbar spine and left hip. Transverse diameter of heart is increased. IMPRESSION: Tip of feeding tube is seen in the distal antrum of the stomach. Nonspecific  bowel gas pattern. Electronically Signed   By: Elmer Picker M.D.   On: 01/15/2022 14:27   DG CHEST PORT 1 VIEW  Result Date: 01/15/2022 CLINICAL DATA:  Intubation, balloon pump EXAM: PORTABLE CHEST 1 VIEW COMPARISON:  Portable exam 0527 hours compared to 01/14/2022 FINDINGS: Tip of endotracheal tube projects 2.7 cm above carina. Nasogastric tube coiled in proximal stomach. BILATERAL jugular venous catheters with tips projecting over SVC. Tip of intra-aortic balloon pump at mid T6 below aortic arch. Enlargement of cardiac silhouette. Mediastinal contours and pulmonary vascularity normal. Atherosclerotic calcification aorta. Calcified subcarinal lymph node. Lungs grossly clear without infiltrate, pleural effusion, or pneumothorax. IMPRESSION: Marked enlargement of cardiac silhouette. No acute infiltrate. Aortic Atherosclerosis (ICD10-I70.0). Electronically Signed   By: Lavonia Dana M.D.   On: 01/15/2022 08:07    Medications: Infusions:   prismasol BGK 4/2.5 400 mL/hr at 01/16/22 0530    prismasol BGK 4/2.5 400 mL/hr at 01/16/22 0529   sodium chloride Stopped (01/10/22 2245)   amiodarone 30 mg/hr (01/16/22 1100)   dexmedetomidine (PRECEDEX) IV infusion 0.3 mcg/kg/hr (01/16/22 1100)   DOBUTamine 5 mcg/kg/min (01/16/22 1100)   feeding supplement (VITAL 1.5 CAL) 20 mL/hr at 01/16/22 1100   heparin 500 Units/hr (01/16/22 1100)   norepinephrine (LEVOPHED) Adult infusion 10 mcg/min (01/16/22 1100)   prismasol BGK 4/2.5 1,500 mL/hr at 01/16/22 0830   sodium phosphate 30 mmol in dextrose 5 % 250 mL infusion 42 mL/hr at 01/16/22 1100    Scheduled Medications:  allopurinol  50 mg Per Tube QPM   atorvastatin  80 mg Per Tube Daily   Chlorhexidine Gluconate Cloth  6 each Topical Daily   docusate  100 mg Per Tube BID   ezetimibe  10 mg Per Tube Daily   feeding supplement (PROSource TF20)  60 mL Per Tube BID   Gerhardt's butt cream   Topical BID   insulin aspart  0-15 Units Subcutaneous Q4H    insulin glargine-yfgn  20 Units Subcutaneous QHS   LORazepam  0.5 mg Per Tube QHS   multivitamin  1 tablet Oral QHS   mouth rinse  15 mL Mouth Rinse 4 times per day   mouth rinse  15 mL Mouth Rinse 4 times per day   oxyCODONE  5 mg Per Tube Q6H   pantoprazole (PROTONIX) IV  40 mg Intravenous Q12H   polyethylene glycol  17 g Per Tube Daily   senna-docusate  1 tablet Per Tube BID   sodium chloride flush  10-40 mL Intracatheter Q12H   sodium chloride flush  3 mL Intravenous Q12H    have reviewed scheduled and prn medications.  Physical Exam: General: She is extubated, alert awake but lethargic:  Heart:RRR, s1s2 nl, CVP around 5. Lungs: Coarse breath sound bilateral Abdomen:soft, Non-tender, non-distended Extremities:No edema Dialysis Access: Right IJ temporary HD catheter in place  Perrie Ragin Tanna Furry 01/16/2022,11:03 AM  LOS: 8 days

## 2022-01-16 DEATH — deceased

## 2022-01-17 ENCOUNTER — Encounter (HOSPITAL_COMMUNITY): Payer: Self-pay | Admitting: Certified Registered Nurse Anesthetist

## 2022-01-17 ENCOUNTER — Inpatient Hospital Stay (HOSPITAL_COMMUNITY): Payer: Medicare Other

## 2022-01-17 DIAGNOSIS — I5023 Acute on chronic systolic (congestive) heart failure: Secondary | ICD-10-CM

## 2022-01-17 DIAGNOSIS — I340 Nonrheumatic mitral (valve) insufficiency: Secondary | ICD-10-CM | POA: Diagnosis not present

## 2022-01-17 LAB — HEPARIN LEVEL (UNFRACTIONATED): Heparin Unfractionated: 0.33 IU/mL (ref 0.30–0.70)

## 2022-01-17 LAB — CBC
HCT: 28.6 % — ABNORMAL LOW (ref 36.0–46.0)
Hemoglobin: 8.5 g/dL — ABNORMAL LOW (ref 12.0–15.0)
MCH: 25.3 pg — ABNORMAL LOW (ref 26.0–34.0)
MCHC: 29.7 g/dL — ABNORMAL LOW (ref 30.0–36.0)
MCV: 85.1 fL (ref 80.0–100.0)
Platelets: 146 10*3/uL — ABNORMAL LOW (ref 150–400)
RBC: 3.36 MIL/uL — ABNORMAL LOW (ref 3.87–5.11)
RDW: 20.5 % — ABNORMAL HIGH (ref 11.5–15.5)
WBC: 15.1 10*3/uL — ABNORMAL HIGH (ref 4.0–10.5)
nRBC: 0.4 % — ABNORMAL HIGH (ref 0.0–0.2)

## 2022-01-17 LAB — RENAL FUNCTION PANEL
Albumin: 2.4 g/dL — ABNORMAL LOW (ref 3.5–5.0)
Anion gap: 7 (ref 5–15)
BUN: 20 mg/dL (ref 8–23)
CO2: 27 mmol/L (ref 22–32)
Calcium: 8.8 mg/dL — ABNORMAL LOW (ref 8.9–10.3)
Chloride: 102 mmol/L (ref 98–111)
Creatinine, Ser: 1.33 mg/dL — ABNORMAL HIGH (ref 0.44–1.00)
GFR, Estimated: 43 mL/min — ABNORMAL LOW (ref >60)
Glucose, Bld: 206 mg/dL — ABNORMAL HIGH (ref 70–99)
Phosphorus: 1.2 mg/dL — ABNORMAL LOW (ref 2.5–4.6)
Potassium: 4.4 mmol/L (ref 3.5–5.1)
Sodium: 136 mmol/L (ref 135–145)

## 2022-01-17 LAB — COOXEMETRY PANEL
Carboxyhemoglobin: 1.2 % (ref 0.5–1.5)
Methemoglobin: <0.7 % (ref 0.0–1.5)
O2 Saturation: 65.3 %
Total hemoglobin: 14.1 g/dL (ref 12.0–16.0)

## 2022-01-17 LAB — MAGNESIUM: Magnesium: 2.8 mg/dL — ABNORMAL HIGH (ref 1.7–2.4)

## 2022-01-17 LAB — GLUCOSE, CAPILLARY
Glucose-Capillary: 183 mg/dL — ABNORMAL HIGH (ref 70–99)
Glucose-Capillary: 218 mg/dL — ABNORMAL HIGH (ref 70–99)
Glucose-Capillary: 193 mg/dL — ABNORMAL HIGH (ref 70–99)
Glucose-Capillary: 269 mg/dL — ABNORMAL HIGH (ref 70–99)
Glucose-Capillary: 222 mg/dL — ABNORMAL HIGH (ref 70–99)

## 2022-01-17 LAB — TRIGLYCERIDES: Triglycerides: 101 mg/dL (ref <150)

## 2022-01-17 MED ORDER — INSULIN GLARGINE-YFGN 100 UNIT/ML ~~LOC~~ SOLN
22.0000 [IU] | Freq: Every day | SUBCUTANEOUS | Status: DC
  Administered 2022-01-17: 22 [IU] via SUBCUTANEOUS
  Filled 2022-01-17 (×2): qty 0.22

## 2022-01-17 MED ORDER — NOREPINEPHRINE 16 MG/250ML-% IV SOLN
0.0000 ug/min | INTRAVENOUS | Status: DC
  Administered 2022-01-17: 2 ug/min via INTRAVENOUS
  Administered 2022-01-18: 15 ug/min via INTRAVENOUS
  Filled 2022-01-17 (×2): qty 250

## 2022-01-17 MED ORDER — SODIUM PHOSPHATES 45 MMOLE/15ML IV SOLN
30.0000 mmol | Freq: Once | INTRAVENOUS | Status: AC
  Administered 2022-01-17: 30 mmol via INTRAVENOUS
  Filled 2022-01-17: qty 10

## 2022-01-17 NOTE — Progress Notes (Addendum)
[facility] NEPHROLOGY PROGRESS NOTE  Assessment/ Plan: Pt is a 71 y.o. yo female  w/ hx of anemia, breast cancer, CAD, HFrEF, CKD 3, DM2, gout, HL, HTN, morbid obesity, OSA, pHTN, severe mitral regurgitation w/ LVEF 20-25% who presented on 8/24 for planned TEE for the MR.  Patient became hypoxic, had PEA arrest, intubated, developed AKI and transferred to ICU.  #Acute kidney injury on CKD stage IV: b/l cr arounf 1.7-2.4.  AKI due to cardiac arrest/CHF.  Moved to ICU for inotropes including dobutamine and vasopressors.  Started CRRT on 8/26 and has been tolerating well. Remains anuric without sign of renal recovery.   CVP has significantly improved and she is now close to euvolemic.  I think we can pause CRRT and then watch for renal recovery.  I will discuss with heart failure team. All 4K bath, IV heparin for anticoagulation.    #Acute on chronic systolic CHF/cardiogenic shock: Currently on dobutamine, Levophed.  UF/volume management with CRRT.  Heart failure team is following.  #A-fib with RVR: On amiodarone and anticoagulation.  #VDRF/acute hypoxic respiratory failure: She is extubated now and on nasal cannula.  Pulmonary team is following.    #Anemia: Monitor hemoglobin.  #Hypophosphatemia: Replete sodium phosphate.  Addendum: Discussed with cardiology team as well as with the patient's husband.  The patient is currently hemodynamically optimized but see continues to deteriorate and with multiple organ failure.  The recovery of renal function is very less.  We will discontinue CRRT now.  The family leaning towards possible comfort measures.  Subjective: Seen and examined in ICU.  No urine output.  Tolerating CRRT well, remains on oxygen via nasal cannula.  Her husband was present at the bedside.  No new event.  Objective Vital signs in last 24 hours: Vitals:   01/17/22 0845 01/17/22 0900 01/17/22 0915 01/17/22 0930  BP:  (!) 111/36    Pulse:      Resp: (!) '22 19 20  20  '$ Temp:      TempSrc:      SpO2: 98% 97% 98% 98%  Weight:      Height:       Weight change:   Intake/Output Summary (Last 24 hours) at 01/17/2022 0955 Last data filed at 01/17/2022 0900 Gross per 24 hour  Intake 2811.1 ml  Output 3767 ml  Net -955.9 ml        Labs: RENAL PANEL Recent Labs    09/20/21 0436 09/20/21 0827 01/11/22 0357 01/11/22 1610 12/20/2021 0416 12/16/2021 1600 01/03/2022 1852 01/13/22 0442 01/13/22 1648 01/14/22 0338 01/14/22 1719 01/15/22 0425 01/15/22 1755 01/16/22 0451 01/16/22 1539 01/17/22 0450  NA  --    < > 135   < > 136   < > '135 135 137 136 137 137 138 136 136 136 '$  K  --    < > 4.2   < > 4.3   < > 4.3 3.9 4.3 4.7 4.8 4.9 5.2* 4.4 4.2 4.4  CL  --    < > 99   < > 104  --  '102 100 103 99 100 102 104 101 101 102 '$  CO2  --    < > 23   < > 24  --  '22 26 25 25 26 26 26 25 26 27  '$ GLUCOSE  --    < > 133*   < > 109*  --  142* 191* 175* 184* 194* 187* 134* 155* 194* 206*  BUN  --    < >  39*   < > 18  --  '18 19 18 16 16 19 21 19 21 20  '$ CREATININE  --    < > 2.88*   < > 1.54*  --  1.75* 1.51* 1.30* 1.27* 1.39* 1.34* 1.22* 1.31* 1.28* 1.33*  CALCIUM  --    < > 8.3*   < > 8.0*  --  8.4* 8.2* 8.7* 9.2 9.3 9.5 9.4 8.9 8.8* 8.8*  MG 2.2  --  1.9  --  2.5*  --  2.6* 2.6* 2.8* 2.6*  --  2.8*  --  2.7*  --  2.8*  PHOS  --   --  3.9   < >  --   --  3.9  3.9 2.3* 1.9*  1.9* 2.8 1.7* 1.7* 1.5* 1.5* 2.5 1.2*  ALBUMIN  --    < > 2.9*   < > 2.8*  --  2.6* 2.5* 2.6* 2.7* 2.6* 2.5* 2.5* 2.4* 2.4* 2.4*   < > = values in this interval not displayed.      Liver Function Tests: Recent Labs  Lab 01/11/2022 0416 01/02/2022 1852 01/16/22 0451 01/16/22 1539 01/17/22 0450  AST 154*  --   --   --   --   ALT 86*  --   --   --   --   ALKPHOS 74  --   --   --   --   BILITOT 1.0  --   --   --   --   PROT 6.8  --   --   --   --   ALBUMIN 2.8*   < > 2.4* 2.4* 2.4*   < > = values in this interval not displayed.    No results for input(s): "LIPASE", "AMYLASE" in the last 168  hours. No results for input(s): "AMMONIA" in the last 168 hours. CBC: Recent Labs    09/25/21 0835 09/26/21 0214 01/13/22 0442 01/14/22 0338 01/15/22 0425 01/16/22 0451 01/17/22 0450  HGB  --    < > 7.9* 8.3* 8.4* 8.5* 8.5*  MCV  --    < > 87.8 86.3 85.6 84.2 85.1  FERRITIN 442*  --   --   --   --   --   --   TIBC 319  --   --   --   --   --   --   IRON 29  --   --   --   --   --   --    < > = values in this interval not displayed.     Cardiac Enzymes: No results for input(s): "CKTOTAL", "CKMB", "CKMBINDEX", "TROPONINI" in the last 168 hours. CBG: Recent Labs  Lab 01/16/22 1111 01/16/22 1537 01/16/22 1953 01/16/22 2314 01/17/22 0317  GLUCAP 196* 198* 175* 173* 183*     Iron Studies: No results for input(s): "IRON", "TIBC", "TRANSFERRIN", "FERRITIN" in the last 72 hours. Studies/Results: DG CHEST PORT 1 VIEW  Result Date: 01/16/2022 CLINICAL DATA:  CHF EXAM: PORTABLE CHEST 1 VIEW COMPARISON:  Portable exam 0852 hours compared to 01/15/2022 FINDINGS: Interval removal of endotracheal and nasogastric tubes. Feeding tube extends into abdomen. BILATERAL jugular venous catheters unchanged. Enlargement of cardiac silhouette. Calcified mediastinal lymph nodes and atherosclerotic calcification aorta. Subsegmental atelectasis at LEFT base, remaining lungs clear. No pleural effusion or pneumothorax. IMPRESSION: LEFT basilar atelectasis. Enlargement of cardiac silhouette. Aortic Atherosclerosis (ICD10-I70.0). Electronically Signed   By: Lavonia Dana M.D.   On: 01/16/2022 09:01  DG Abd Portable 1V  Result Date: 01/15/2022 CLINICAL DATA:  Feeding tube placement EXAM: PORTABLE ABDOMEN - 1 VIEW COMPARISON:  01/13/2022 FINDINGS: There is interval replacement of enteric tube with feeding tube. Tip of feeding tube is seen in the distal antrum of the stomach. Bowel gas pattern is nonspecific. There is previous right hip arthroplasty. Degenerative changes are noted in lumbar spine and left hip.  Transverse diameter of heart is increased. IMPRESSION: Tip of feeding tube is seen in the distal antrum of the stomach. Nonspecific bowel gas pattern. Electronically Signed   By: Elmer Picker M.D.   On: 01/15/2022 14:27    Medications: Infusions:   prismasol BGK 4/2.5 400 mL/hr at 01/17/22 0813    prismasol BGK 4/2.5 400 mL/hr at 01/17/22 5885   sodium chloride Stopped (01/10/22 2245)   amiodarone 30 mg/hr (01/17/22 0900)   [START ON 01/20/2022]  ceFAZolin (ANCEF) IV     [START ON 01/20/2022]  ceFAZolin (ANCEF) IV     dexmedetomidine (PRECEDEX) IV infusion 0.5 mcg/kg/hr (01/17/22 0900)   [START ON 01/20/2022] dexmedetomidine     DOBUTamine 5 mcg/kg/min (01/17/22 0900)   feeding supplement (VITAL 1.5 CAL) 45 mL/hr at 01/17/22 0900   [START ON 01/20/2022] heparin 30,000 units/NS 1000 mL solution for CELLSAVER     heparin 500 Units/hr (01/17/22 0900)   [START ON 01/20/2022] milrinone     [START ON 01/20/2022] nitroGLYCERIN     norepinephrine (LEVOPHED) Adult infusion 11 mcg/min (01/17/22 0900)   [START ON 01/20/2022] norepinephrine     prismasol BGK 4/2.5 1,500 mL/hr at 01/17/22 0813   sodium phosphate 30 mmol in dextrose 5 % 250 mL infusion 43 mL/hr at 01/17/22 0900   [START ON 01/20/2022] tranexamic acid (CYKLOKAPRON) 2,500 mg in sodium chloride 0.9 % 250 mL (10 mg/mL) infusion     [START ON 01/20/2022] vancomycin      Scheduled Medications:  allopurinol  50 mg Per Tube QPM   atorvastatin  80 mg Per Tube Daily   Chlorhexidine Gluconate Cloth  6 each Topical Daily   docusate  100 mg Per Tube BID   [START ON 01/20/2022] epinephrine  0-10 mcg/min Intravenous To OR   ezetimibe  10 mg Per Tube Daily   feeding supplement (PROSource TF20)  60 mL Per Tube BID   Gerhardt's butt cream   Topical BID   [START ON 01/20/2022] heparin sodium (porcine) 2,500 Units, papaverine 30 mg in electrolyte-A (PLASMALYTE-A PH 7.4) 500 mL irrigation   Irrigation To OR   insulin aspart  0-15 Units Subcutaneous Q4H   insulin  glargine-yfgn  22 Units Subcutaneous QHS   [START ON 01/20/2022] insulin   Intravenous To OR   [START ON 01/20/2022] [place] Blood Cardioplegia vial (lidocaine/magnesium/mannitol 0.26g-4g-6.4g)   Intracoronary To OR   LORazepam  0.5 mg Per Tube QHS   multivitamin  1 tablet Oral QHS   mouth rinse  15 mL Mouth Rinse 4 times per day   mouth rinse  15 mL Mouth Rinse 4 times per day   oxyCODONE  5 mg Per Tube Q6H   pantoprazole (PROTONIX) IV  40 mg Intravenous Q12H   [START ON 01/20/2022] phenylephrine  30-200 mcg/min Intravenous To OR   polyethylene glycol  17 g Per Tube Daily   [START ON 01/20/2022] potassium chloride  80 mEq Other To OR   senna-docusate  1 tablet Per Tube BID   sodium chloride flush  10-40 mL Intracatheter Q12H   sodium chloride flush  3 mL Intravenous  Q12H   [START ON 01/20/2022] tranexamic acid  15 mg/kg Intravenous To OR   [START ON 01/20/2022] tranexamic acid  2 mg/kg Intracatheter To OR    have reviewed scheduled and prn medications.  Physical Exam: General: She is extubated, alert awake but lethargic: Heart:RRR, s1s2 nl, CVP around 5. Lungs: Coarse breath sound bilateral Abdomen:soft, Non-tender, non-distended Extremities:No edema Dialysis Access: Right IJ temporary HD catheter in place  Tafari Humiston Prasad Rudra Hobbins 01/17/2022,9:55 AM  LOS: 9 days

## 2022-01-17 NOTE — Evaluation (Signed)
Occupational Therapy Evaluation Patient Details Name: Stephanie Franco MRN: [id number] DOB: 16-May-1951 Today's Date: 01/17/2022   History of Present Illness 71 yo admitted 8/24 for mitral clipping which was attempted but had to aborted due to inability to cross septum. 8/26 code with intubation and CRRT started. 8/28 RHC with IABP placed. 8/31 extubated. PMhx: AFib, NICM, CKD, T2DM, HLD, HTN   Clinical Impression   Patient admitted for the diagnosis above.  Patient is profoundly deconditioned, and will need prolonged rehab to maximize her functional status prior to returning home.  OT will follow and LTAC is recommended.        Recommendations for follow up therapy are one component of a multi-disciplinary discharge planning process, led by the attending physician.  Recommendations may be updated based on patient status, additional functional criteria and insurance authorization.   Follow Up Recommendations  OT at Long-term acute care hospital    Assistance Recommended at Discharge Frequent or constant Supervision/Assistance  Patient can return home with the following Two people to help with bathing/dressing/bathroom;Two people to help with walking and/or transfers    Functional Status Assessment  Patient has had a recent decline in their functional status and demonstrates the ability to make significant improvements in function in a reasonable and predictable amount of time.  Equipment Recommendations  Wheelchair (measurements OT);Wheelchair cushion (measurements OT)    Recommendations for Other Services       Precautions / Restrictions Precautions Precautions: Other (comment);Fall Precaution Comments: Impella, CRRT, IABP left fem Restrictions Weight Bearing Restrictions: No      Mobility Bed Mobility Overal bed mobility: Needs Assistance Bed Mobility: Rolling Rolling: Max assist, +2 for physical assistance              Transfers                           Balance                                           ADL either performed or assessed with clinical judgement   ADL                                         General ADL Comments: Dep care at bedlevel for all ADL.  NPO     Vision   Vision Assessment?: Vision impaired- to be further tested in functional context;No apparent visual deficits     Perception Perception Perception: Not tested   Praxis Praxis Praxis: Not tested    Pertinent Vitals/Pain Pain Assessment Pain Assessment: Faces Faces Pain Scale: Hurts little more Pain Location: back Pain Descriptors / Indicators: Discomfort, Grimacing Pain Intervention(s): Monitored during session, Premedicated before session     Hand Dominance Right   Extremity/Trunk Assessment Upper Extremity Assessment Upper Extremity Assessment: Generalized weakness RUE Deficits / Details: unable to bring her hand to her mouth LUE Deficits / Details: unable to bring her hand to her mouth   Lower Extremity Assessment Lower Extremity Assessment: Defer to PT evaluation       Communication Communication Communication: No difficulties   Cognition Arousal/Alertness: Awake/alert, Lethargic Behavior During Therapy: Flat affect Overall Cognitive Status: Within Functional Limits for tasks assessed  General Comments   VSS    Exercises General Exercises - Upper Extremity Shoulder Flexion: AAROM, Both, Supine, 10 reps Shoulder ABduction: AAROM, Both, 10 reps, Supine Elbow Flexion: AROM, Both, Supine, 10 reps Elbow Extension: AROM, Both, 10 reps, Supine Wrist Flexion: AAROM, Supine Wrist Extension: AAROM, Supine Digit Composite Flexion: AAROM, Supine Composite Extension: AAROM, Supine   Shoulder Instructions      Home Living Family/patient expects to be discharged to:: Private residence Living Arrangements: Spouse/significant other Available Help at  Discharge: Family;Available 24 hours/day Type of Home: Apartment Home Access: Level entry     Home Layout: One level     Bathroom Shower/Tub: Teacher, early years/pre: Handicapped height Bathroom Accessibility: Yes How Accessible: Accessible via walker Home Equipment: [place] (2 wheels);Cane - single point          Prior Functioning/Environment                 ADLs Comments: Spouse assists with community mobility, lower body ADL and home mangement.  PCA for weekend.        OT Problem List: Decreased strength;Decreased range of motion;Decreased activity tolerance;Impaired balance (sitting and/or standing);Decreased knowledge of use of DME or AE;Decreased knowledge of precautions;Pain;Impaired UE functional use      OT Treatment/Interventions: Self-care/ADL training;Therapeutic exercise;Balance training;Patient/family education;Energy conservation;Therapeutic activities;DME and/or AE instruction    OT Goals(Current goals can be found in the care plan section) Acute Rehab OT Goals Patient Stated Goal: none stated OT Goal Formulation: Patient unable to participate in goal setting Time For Goal Achievement: 01/30/22 Potential to Achieve Goals: Good ADL Goals Pt Will Perform Grooming: with min assist;bed level Pt/caregiver will Perform Home Exercise Program: Increased strength;Both right and left upper extremity;With minimal assist Additional ADL Goal #1: Patient will roll side to side with Max A to increase independence with toileting Additional ADL Goal #2: Patient will tolerate upright sitting at edge of bed for 10 min to increase independence with grooming/ADL  OT Frequency: Min 2X/week    Co-evaluation              AM-PAC OT "6 Clicks" Daily Activity     Outcome Measure Help from another person eating meals?: Total Help from another person taking care of personal grooming?: Total Help from another person toileting, which includes using  toliet, bedpan, or urinal?: Total Help from another person bathing (including washing, rinsing, drying)?: Total Help from another person to put on and taking off regular upper body clothing?: Total Help from another person to put on and taking off regular lower body clothing?: Total 6 Click Score: 6   End of Session Nurse Communication: Mobility status  Activity Tolerance: Patient limited by lethargy Patient left: in bed;with call bell/phone within reach  OT Visit Diagnosis: Muscle weakness (generalized) (M62.81)                Time: 2297-9892 OT Time Calculation (min): 15 min Charges:  OT General Charges $OT Visit: 1 Visit OT Evaluation $OT Eval Moderate Complexity: 1 Mod  01/17/2022  RP, OTR/L  Acute Rehabilitation Services  Office:  [phone]   Metta Clines 01/17/2022, 9:04 AM

## 2022-01-17 NOTE — Progress Notes (Signed)
ANTICOAGULATION CONSULT NOTE  Pharmacy Consult for heparin Indication: atrial fibrillation  No Known Allergies  Patient Measurements: Height: '5\' 3"'$  (160 cm) Weight: 70.4 kg (155 lb 3.3 oz) IBW/kg (Calculated) : 52.4 Heparin Dosing Weight: 81kg  Vital Signs: Temp: 99 F (37.2 C) (09/02 0400) Temp Source: Axillary (09/02 0400) BP: 118/39 (09/01 2200) Pulse Rate: 97 (09/02 0600)  Labs: Recent Labs    01/15/22 0425 01/15/22 1755 01/16/22 0451 01/16/22 1539 01/17/22 0450  HGB 8.4*  --  8.5*  --  8.5*  HCT 27.4*  --  27.2*  --  28.6*  PLT 141*  --  141*  --  146*  APTT 61*  --  45*  --   --   HEPARINUNFRC 0.54  --  0.48  --  0.33  CREATININE 1.34*   < > 1.31* 1.28* 1.33*   < > = values in this interval not displayed.     Estimated Creatinine Clearance: 36 mL/min (A) (by C-G formula based on SCr of 1.33 mg/dL (H)).   Assessment: 85 yoF s/p unsuccessful TEER for mitral valve. Pt on apixaban PTA for hx AFib, now s/p brief cardiac arrest and pharmacy asked to transition to IV heparin. Last apixaban dose was 8/26 1050.  S/p placement of IABP on 8/28, will lower heparin/aPTT goals.  Heparin level at goal. No overt bleeding or complications noted.   Goal of Therapy:  Heparin level 0.2-0.5 units/ml aPTT 56-85 seconds Monitor platelets by anticoagulation protocol: Yes   Plan:  Continue IV heparin at 500 units/hr. Continue daily heparin level and CBC.  Thank you for allowing pharmacy to participate in this patient's care.  Reatha Harps, PharmD PGY2 Pharmacy Resident 01/17/2022 6:54 AM Check [email or web address] for unit specific pharmacy number

## 2022-01-17 NOTE — Progress Notes (Signed)
Patient ID: Stephanie Franco, female   DOB: May 01, 1951, 71 y.o.   MRN: [id number]  Advanced Heart Failure Rounding Note  PCP-Cardiologist: Glenetta Hew, MD   Subjective:    08/27: Milrinone switched to DBA d/t pressor requirement 08/28: RHC + IABP placement. Unable to keep Swan in position due to pulsatility of heart with severe TR/MR so removed.  8/31 Extubated.   Remains on CVVHD, NE 12, IABP 1:1   Somnolent but will arouse. Moaning in pain. Respirations labored at times. CVP 7. Co-ox 65%  Anuric    Heart Pressures RHC Procedural Findings: Hemodynamics (mmHg) RA mean 22, V wave to 32 RV 74/18 PA 78/33, mean 51 PCWP mean 28, V wave to 38  Oxygen saturations: IVC 73%  RA 85% RV 85% PA 85% AO 100%  Cardiac Output (Fick) 13 (high output with shunt) Cardiac Index (Fick) 7.7  PVR < 2 WU  Shunt fraction 1.8/1    Objective:   Weight Range: 70.4 kg Body mass index is 27.49 kg/m.   Vital Signs:   Temp:  [97.4 F (36.3 C)-100.2 F (37.9 C)] 100.2 F (37.9 C) (09/02 0800) Pulse Rate:  [78-179] 168 (09/02 0715) Resp:  [15-35] 16 (09/02 1015) BP: (111-122)/(36-57) 112/37 (09/02 1000) SpO2:  [94 %-100 %] 98 % (09/02 1015) Arterial Line BP: (93-122)/(34-57) 103/46 (09/02 1015) Weight:  [70.4 kg] 70.4 kg (09/02 0500) Last BM Date : 01/17/22  Weight change: Filed Weights   01/14/22 0445 01/15/22 0446 01/17/22 0500  Weight: 71.5 kg 68.6 kg 70.4 kg    Intake/Output:   Intake/Output Summary (Last 24 hours) at 01/17/2022 1057 Last data filed at 01/17/2022 1000 Gross per 24 hour  Intake 2847.68 ml  Output 3692 ml  Net -844.32 ml      Physical Exam   General:  Ill appearing. Somnolent but arousable. Moaning in pain HEENT: normal + cor-trak Neck: supple. RIJ HD cath LIT TLC  Carotids 2+ bilat; no bruits. No lymphadenopathy or thryomegaly appreciated. Cor: PMI nondisplaced. Irregular rate & rhythm. 2/6 MR/TR Lungs: clear shallow breathing Abdomen: obese  soft,  nontender, nondistended. No hepatosplenomegaly. No bruits or masses. Good bowel sounds. Extremities: no cyanosis, clubbing, rash, tr edema LFA IABP site ok  RFV site ok  Neuro: Somnolent but arousable. Weak.    Telemetry   A fib 880-90 Personally reviewed   Labs    CBC Recent Labs    01/16/22 0451 01/17/22 0450  WBC 14.6* 15.1*  HGB 8.5* 8.5*  HCT 27.2* 28.6*  MCV 84.2 85.1  PLT 141* 146*    Basic Metabolic Panel Recent Labs    01/16/22 0451 01/16/22 1539 01/17/22 0450  NA 136 136 136  K 4.4 4.2 4.4  CL 101 101 102  CO2 '25 26 27  '$ GLUCOSE 155* 194* 206*  BUN '19 21 20  '$ CREATININE 1.31* 1.28* 1.33*  CALCIUM 8.9 8.8* 8.8*  MG 2.7*  --  2.8*  PHOS 1.5* 2.5 1.2*    Liver Function Tests Recent Labs    01/16/22 1539 01/17/22 0450  ALBUMIN 2.4* 2.4*    No results for input(s): "LIPASE", "AMYLASE" in the last 72 hours. Cardiac Enzymes No results for input(s): "CKTOTAL", "CKMB", "CKMBINDEX", "TROPONINI" in the last 72 hours.  BNP: BNP (last 3 results) Recent Labs    10/07/21 1513 11/12/21 1442 12/01/21 1531  BNP 71.8 91.4 104.2*     ProBNP (last 3 results) No results for input(s): "PROBNP" in the last 8760 hours.   D-Dimer No results  for input(s): "DDIMER" in the last 72 hours. Hemoglobin A1C No results for input(s): "HGBA1C" in the last 72 hours. Fasting Lipid Panel Recent Labs    01/17/22 0450  TRIG 101    Thyroid Function Tests No results for input(s): "TSH", "T4TOTAL", "T3FREE", "THYROIDAB" in the last 72 hours.  Invalid input(s): "FREET3"  Other results:   Imaging    No results found.   Medications:     Scheduled Medications:  allopurinol  50 mg Per Tube QPM   atorvastatin  80 mg Per Tube Daily   Chlorhexidine Gluconate Cloth  6 each Topical Daily   docusate  100 mg Per Tube BID   [START ON 01/20/2022] epinephrine  0-10 mcg/min Intravenous To OR   ezetimibe  10 mg Per Tube Daily   feeding supplement (PROSource TF20)  60  mL Per Tube BID   Gerhardt's butt cream   Topical BID   [START ON 01/20/2022] heparin sodium (porcine) 2,500 Units, papaverine 30 mg in electrolyte-A (PLASMALYTE-A PH 7.4) 500 mL irrigation   Irrigation To OR   insulin aspart  0-15 Units Subcutaneous Q4H   insulin glargine-yfgn  22 Units Subcutaneous QHS   [START ON 01/20/2022] insulin   Intravenous To OR   [START ON 01/20/2022] [place] Blood Cardioplegia vial (lidocaine/magnesium/mannitol 0.26g-4g-6.4g)   Intracoronary To OR   LORazepam  0.5 mg Per Tube QHS   multivitamin  1 tablet Oral QHS   mouth rinse  15 mL Mouth Rinse 4 times per day   mouth rinse  15 mL Mouth Rinse 4 times per day   oxyCODONE  5 mg Per Tube Q6H   pantoprazole (PROTONIX) IV  40 mg Intravenous Q12H   [START ON 01/20/2022] phenylephrine  30-200 mcg/min Intravenous To OR   polyethylene glycol  17 g Per Tube Daily   [START ON 01/20/2022] potassium chloride  80 mEq Other To OR   senna-docusate  1 tablet Per Tube BID   sodium chloride flush  10-40 mL Intracatheter Q12H   sodium chloride flush  3 mL Intravenous Q12H   [START ON 01/20/2022] tranexamic acid  15 mg/kg Intravenous To OR   [START ON 01/20/2022] tranexamic acid  2 mg/kg Intracatheter To OR    Infusions:   prismasol BGK 4/2.5 400 mL/hr at 01/17/22 0813    prismasol BGK 4/2.5 400 mL/hr at 01/17/22 0812   sodium chloride Stopped (01/10/22 2245)   amiodarone 30 mg/hr (01/17/22 1000)   [START ON 01/20/2022]  ceFAZolin (ANCEF) IV     [START ON 01/20/2022]  ceFAZolin (ANCEF) IV     dexmedetomidine (PRECEDEX) IV infusion 0.5 mcg/kg/hr (01/17/22 1000)   [START ON 01/20/2022] dexmedetomidine     DOBUTamine 5 mcg/kg/min (01/17/22 1000)   feeding supplement (VITAL 1.5 CAL) 45 mL/hr at 01/17/22 1000   [START ON 01/20/2022] heparin 30,000 units/NS 1000 mL solution for CELLSAVER     heparin 500 Units/hr (01/17/22 1000)   [START ON 01/20/2022] milrinone     [START ON 01/20/2022] nitroGLYCERIN     norepinephrine (LEVOPHED) Adult infusion 12  mcg/min (01/17/22 1000)   [START ON 01/20/2022] norepinephrine     prismasol BGK 4/2.5 1,500 mL/hr at 01/17/22 0813   sodium phosphate 30 mmol in dextrose 5 % 250 mL infusion 43 mL/hr at 01/17/22 1000   [START ON 01/20/2022] tranexamic acid (CYKLOKAPRON) 2,500 mg in sodium chloride 0.9 % 250 mL (10 mg/mL) infusion     [START ON 01/20/2022] vancomycin      PRN Medications: sodium chloride, acetaminophen,  albuterol, alteplase, bisacodyl, fentaNYL (SUBLIMAZE) injection, heparin, lip balm, LORazepam, naphazoline-glycerin, ondansetron (ZOFRAN) IV, mouth rinse, mouth rinse, oxyCODONE, sodium chloride, sodium chloride flush, sodium chloride flush     Assessment/Plan   1. Acute on chronic systolic CHF => cardiogenic shock: Mixed ischemic/nonischemic cardiomyopathy. RHC in 2/23 showed elevated R>L heart filling pressures with pulmonary arterial hypertension and low CI 1.89. Cardiac MRI w/ myocardial LGE in septum is in a noncoronary pattern, ? myocarditis, anterior wall LGE is subendocardial and may be due to prior MI => ?mixed ischemic/nonischemic cardiomyopathy. TEE (6/23) with EF 30-35%, moderate RV dysfunction, severe MR.  She had attempted Mitraclip placment this admission with failure, now with cardiogenic shock, AKI, and marked volume overload.  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 Intubated on pressors with CVVH running.  IABP in place, did not place Impella as re-attempted Mitraclip procedure, which is probably our only good option at this time to improve her function, would require manipulation of wire across the LA to LV (discussed with Dr. Ali Lowe).   She is now on NE 12 and IABP 1:1 Co-ox 65% (in setting of L=>R shunt). Remains anuric on CVVHD. CVP 5 . Efforts ongoing to try to stabilize her for repeat  Mitraclip + iatrogenic ASD closure.  - Prognosis is guarded.  She has metastatic breast cancer, not candidate for LVAD.  She has severe MR and TR with biventricular dysfunction (ASD also likely plays at least a small role in decompensation).  Failed Mitraclip earlier this admission. Think our only possible option would be to get her stable enough to re-attempt Mitraclip + iatrogenic ASD closure => plan for Tuesday.  - Situation reviewed at length with her, her husband and grandson. Currently optimized from hemodynamic standpoint but continues to deteriorate with multi-system organ failure and chronic pain in setting of metastatic breast CA. At this point I do not think Mitra-Clip will improve her overall condition or fix the issues which are really causing her to suffer currently. I spoke with Ren[facility] and they feel the likelihood of renal recovery is small.  - I discussed the options of comfort care vs ongoing aggressive care with her husband. He will d/w his wife and family and get back to [place]. If she deteriorates over the weekend would avoid re-intubation or repeat CPR.  2. Atrial fibrillation: This is permanent, x years. Rate controlled+  frequent PVCs (10-15/min).  - Continue amiodarone gtt 30 mg/hr.  - Heparin gtt for anticoagulation.  - No change 3. PVCs: Frequent during 2/23 admission but was also on milrinone. ? Contributing to cardiomyopathy.   - Amiodarone gtt. 4. Mitral regurgitation:  TEE (6/23) with severe MR with restricted posterior leaflet in setting of dilation of the LA and LV. mTEER attempted on 8/24 but failed.  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  As above, will try to stabilize her enough to consider repeat Mitraclip.  - Possible Mitraclip/ASD closure on Tuesday.  5. AKI on CKD stage 3: AKI in setting of cardiogenic shock, now on CVVH.  - CVP 5-6 - I spoke with Renal and they feel the likelihood of renal recovery is small.  Would like to hold CVVHD for a day or two given low CVP 6. Pulmonary hypertension: RHC this admission with pulmonary venous hypertension.  7. CAD: Cath 2/23 with prox RCA to Mid RCA lesion 60% stenosis, distal LCx lesion 90% stenosis (similar to the past).  Medical management. Not clear that CAD can explain her cardiomyopathy.  - Continue statin/Zetia  - No ASA given anticoagulation.   8. Acute hypoxemic respiratory failure:  Extubated. Sats stable. [place] 4 liters.  9. ID:  Empirically covered with cefepime for possible sepsis, now off abx.  10. 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  Qp/Qs 1.8 on RHC, will need closure with re-attempt at Mitraclip.  11. Full code for now.  See discussions above   CRITICAL CARE Performed by: Glori Bickers  Total critical care time: 50 minutes  Critical care time was exclusive of separately billable procedures and treating other patients.  Critical care was necessary to treat or prevent imminent or life-threatening deterioration.  Critical care was time spent personally by me (independent of midlevel providers or residents) on the following activities: development of treatment plan with patient and/or surrogate as well as nursing, discussions with consultants, evaluation of patient's response to treatment, examination of patient, obtaining history from patient or surrogate, ordering and performing treatments and interventions, ordering and review of laboratory studies, ordering and review of radiographic studies, pulse oximetry and re-evaluation of patient's condition.   Naftali Carchi. MD 01/17/2022  Advanced Heart Failure Team Pager [phone] (M-F; 7a - 5p)  Please contact [facility] for night-coverage after hours (5p -7a ) and weekends on [email or web address]

## 2022-01-17 NOTE — Progress Notes (Signed)
   NAME:  Stephanie Franco, MRN:  [id number], DOB:  [birth year], LOS: 9 ADMISSION DATE:  01/07/2022, CONSULTATION DATE:  8/26 REFERRING MD:  Debara Pickett, CHIEF COMPLAINT:  dyspnea   History of Present Illness:  71 y/o female with multiple medical problems including severe mitral regurgitation had attempt mitral clip via atrial septostomy; procedure aborted on 8/24 and developed oliguria.  Unfortunately she had a brief cardiac arrest requiring intubation and transfer to ICU. Has required CRRT   Pertinent  Medical History  NICM Afib CKD DM2 Hyperlipidemia Hypertension Asthma Breast cancer CAD Morbid obesity DM2 OSA on CPAP Pulmonary hypertension related to the above  Significant Hospital Events: Including procedures, antibiotic start and stop dates in addition to other pertinent events   8/24 admit, failed mitraclip 8/25 worsening BPs, worsening renal function 8/26 PCCM consult, central line placed, brief code, transfer to ICU intubated, started on CVVH 8/28 RHC and balloon pump placed 8/31 INO stopped today evening.  Pressors and sedation weaned off a.m. and currently tolerating SBT 9/1 no acute issues overnight, complains of sacral pain this a.m. with stage II pressure ulcer seen 9/2 on levophed again, WBC up, low grade temp  Interim History / Subjective:   On vasopressors WBC up, low grade temp Coox 65 Complains of pain  Objective   Blood pressure (!) 116/38, pulse (!) 168, temperature 100.2 F (37.9 C), temperature source Oral, resp. rate (!) 22, height '5\' 3"'$  (1.6 m), weight 70.4 kg, SpO2 98 %. CVP:  [0 mmHg-12 mmHg] 8 mmHg      Intake/Output Summary (Last 24 hours) at 01/17/2022 0857 Last data filed at 01/17/2022 0800 Gross per 24 hour  Intake 2801.41 ml  Output 3627 ml  Net -825.59 ml   Filed Weights   01/14/22 0445 01/15/22 0446 01/17/22 0500  Weight: 71.5 kg 68.6 kg 70.4 kg    Examination:  General:  Resting comfortably in bed HENT: NCAT OP clear PULM: CTA B,  normal effort CV: RRR, no mgr GI: BS+, soft, nontender MSK: normal bulk and tone Neuro: awake, alert, no distress, MAEW   Resolved Hospital Problem list     Assessment & Plan:  Cardiogenic shock due to acute on chronic systolic heart failure with mixed non-ischemic and ischemic cardiomyopathy CAD Atrial fibrillation CAD Severe MR Pulmonary arterial hypertension Continue dobutamine Levophed titrated for MAP > 65 Plan repeat mitraclip on 9/5 Amiodarone to continue Balloon pump to continue, per cards  AKI on CKD Monitor BMET and UOP Replace electrolytes as needed Continue CRRT   Hypoalbuminemia Continue tube feeding  Anemia Monitor for bleeding Transfuse PRBC for Hgb < 7 gm/dL  DM2 with hyperglycemia Continue ssi Increase symglee  Low grade fever, rising WBC> no clear evidence of infection, line sites OK, femoral wound site OK Has stage 2 sacral decub Check CXR Blood cultures Wound care Low threshold to add antibiotics  Metastatic breast cancer with pain Oxycodone per home regimen precedex Fentanyl   Best Practice (right click and "Reselect all SmartList Selections" daily)   Diet/type: tubefeeds DVT prophylaxis: systemic heparin GI prophylaxis: N/A Lines: Central line, Dialysis Catheter, and yes and it is still needed Foley:  N/A Code Status:  full code Last date of multidisciplinary goals of care discussion [per primary]  Critical care time: 35 minutes    Roselie Awkward, MD [facility] Pager: [phone] Cell: [phone] After 7:00 pm call Elink  [phone]

## 2022-01-18 DIAGNOSIS — I340 Nonrheumatic mitral (valve) insufficiency: Secondary | ICD-10-CM | POA: Diagnosis not present

## 2022-01-18 LAB — CBC
HCT: 27.3 % — ABNORMAL LOW (ref 36.0–46.0)
Hemoglobin: 8.4 g/dL — ABNORMAL LOW (ref 12.0–15.0)
MCH: 25.7 pg — ABNORMAL LOW (ref 26.0–34.0)
MCHC: 30.8 g/dL (ref 30.0–36.0)
MCV: 83.5 fL (ref 80.0–100.0)
Platelets: 146 10*3/uL — ABNORMAL LOW (ref 150–400)
RBC: 3.27 MIL/uL — ABNORMAL LOW (ref 3.87–5.11)
RDW: 20 % — ABNORMAL HIGH (ref 11.5–15.5)
WBC: 17 10*3/uL — ABNORMAL HIGH (ref 4.0–10.5)
nRBC: 0.2 % (ref 0.0–0.2)

## 2022-01-18 LAB — RENAL FUNCTION PANEL
Albumin: 2.1 g/dL — ABNORMAL LOW (ref 3.5–5.0)
Anion gap: 12 (ref 5–15)
BUN: 59 mg/dL — ABNORMAL HIGH (ref 8–23)
CO2: 21 mmol/L — ABNORMAL LOW (ref 22–32)
Calcium: 8.6 mg/dL — ABNORMAL LOW (ref 8.9–10.3)
Chloride: 101 mmol/L (ref 98–111)
Creatinine, Ser: 3.02 mg/dL — ABNORMAL HIGH (ref 0.44–1.00)
GFR, Estimated: 16 mL/min — ABNORMAL LOW (ref >60)
Glucose, Bld: 279 mg/dL — ABNORMAL HIGH (ref 70–99)
Phosphorus: 3 mg/dL (ref 2.5–4.6)
Potassium: 4.9 mmol/L (ref 3.5–5.1)
Sodium: 134 mmol/L — ABNORMAL LOW (ref 135–145)

## 2022-01-18 LAB — HEPARIN LEVEL (UNFRACTIONATED): Heparin Unfractionated: 0.27 IU/mL — ABNORMAL LOW (ref 0.30–0.70)

## 2022-01-18 LAB — COOXEMETRY PANEL
Carboxyhemoglobin: 1 % (ref 0.5–1.5)
Methemoglobin: <0.7 % (ref 0.0–1.5)
O2 Saturation: 71 %
Total hemoglobin: 8.8 g/dL — ABNORMAL LOW (ref 12.0–16.0)

## 2022-01-18 LAB — MAGNESIUM: Magnesium: 2.7 mg/dL — ABNORMAL HIGH (ref 1.7–2.4)

## 2022-01-18 LAB — GLUCOSE, CAPILLARY
Glucose-Capillary: 223 mg/dL — ABNORMAL HIGH (ref 70–99)
Glucose-Capillary: 274 mg/dL — ABNORMAL HIGH (ref 70–99)

## 2022-01-18 MED ORDER — GLYCOPYRROLATE 1 MG PO TABS: 1.0000 mg | ORAL_TABLET | ORAL | Status: DC | PRN

## 2022-01-18 MED ORDER — FENTANYL BOLUS VIA INFUSION
300.0000 ug | Freq: Once | INTRAVENOUS | Status: AC
  Administered 2022-01-18: 300 ug via INTRAVENOUS
  Filled 2022-01-18: qty 300

## 2022-01-18 MED ORDER — ACETAMINOPHEN 650 MG RE SUPP: 650.0000 mg | Freq: Four times a day (QID) | RECTAL | Status: DC | PRN

## 2022-01-18 MED ORDER — MIDAZOLAM-SODIUM CHLORIDE 100-0.9 MG/100ML-% IV SOLN
5.0000 mg/h | INTRAVENOUS | Status: DC
  Administered 2022-01-18 (×2): 5 mg/h via INTRAVENOUS
  Filled 2022-01-18 (×2): qty 100

## 2022-01-18 MED ORDER — GLYCOPYRROLATE 0.2 MG/ML IJ SOLN: 0.2000 mg | INTRAMUSCULAR | Status: DC | PRN

## 2022-01-18 MED ORDER — INSULIN GLARGINE-YFGN 100 UNIT/ML ~~LOC~~ SOLN
30.0000 [IU] | Freq: Every day | SUBCUTANEOUS | Status: DC
Start: 2022-01-18 — End: 2022-01-19
  Filled 2022-01-18 (×2): qty 0.3

## 2022-01-18 MED ORDER — POLYVINYL ALCOHOL 1.4 % OP SOLN
1.0000 [drp] | Freq: Four times a day (QID) | OPHTHALMIC | Status: DC | PRN
  Filled 2022-01-18: qty 15

## 2022-01-18 MED ORDER — MIDAZOLAM HCL 2 MG/2ML IJ SOLN
5.0000 mg | Freq: Once | INTRAMUSCULAR | Status: AC
  Administered 2022-01-18: 5 mg via INTRAVENOUS

## 2022-01-18 MED ORDER — INSULIN GLARGINE-YFGN 100 UNIT/ML ~~LOC~~ SOLN
8.0000 [IU] | Freq: Every day | SUBCUTANEOUS | Status: DC
  Filled 2022-01-18: qty 0.08

## 2022-01-18 MED ORDER — GLYCOPYRROLATE 0.2 MG/ML IJ SOLN
0.2000 mg | INTRAMUSCULAR | Status: DC | PRN
Start: 2022-01-18 — End: 2022-01-19
  Administered 2022-01-18: 0.2 mg via INTRAVENOUS
  Filled 2022-01-18: qty 1

## 2022-01-18 MED ORDER — FENTANYL CITRATE PF 50 MCG/ML IJ SOSY
300.0000 ug | PREFILLED_SYRINGE | Freq: Once | INTRAMUSCULAR | Status: DC
  Filled 2022-01-18: qty 6

## 2022-01-18 MED ORDER — ACETAMINOPHEN 325 MG PO TABS: 650.0000 mg | ORAL_TABLET | Freq: Four times a day (QID) | ORAL | Status: DC | PRN

## 2022-01-18 MED ORDER — SODIUM CHLORIDE 0.9 % IV SOLN: INTRAVENOUS | Status: DC

## 2022-01-18 MED ORDER — FENTANYL 2500MCG IN NS 250ML (10MCG/ML) PREMIX INFUSION
0.0000 ug/h | INTRAVENOUS | Status: DC
  Administered 2022-01-18 (×2): 400 ug/h via INTRAVENOUS
  Administered 2022-01-18: 25 ug/h via INTRAVENOUS
  Administered 2022-01-19: 400 ug/h via INTRAVENOUS
  Filled 2022-01-18 (×4): qty 250

## 2022-01-18 MED FILL — Medication: Qty: 1 | Status: AC

## 2022-01-18 NOTE — Progress Notes (Signed)
ANTICOAGULATION CONSULT NOTE  Pharmacy Consult for heparin Indication: atrial fibrillation  No Known Allergies  Patient Measurements: Height: '5\' 3"'$  (160 cm) Weight: 69.8 kg (153 lb 14.1 oz) IBW/kg (Calculated) : 52.4 Heparin Dosing Weight: 81kg  Vital Signs: Temp: 99.3 F (37.4 C) (09/03 0430) Temp Source: Axillary (09/03 0430) BP: 123/36 (09/03 0400) Pulse Rate: 106 (09/03 0700)  Labs: Recent Labs    01/16/22 0451 01/16/22 1539 01/17/22 0450 01/18/22 0410  HGB 8.5*  --  8.5* 8.4*  HCT 27.2*  --  28.6* 27.3*  PLT 141*  --  146* 146*  APTT 45*  --   --   --   HEPARINUNFRC 0.48  --  0.33 0.27*  CREATININE 1.31* 1.28* 1.33* 3.02*     Estimated Creatinine Clearance: 15.8 mL/min (A) (by C-G formula based on SCr of 3.02 mg/dL (H)).   Assessment: 60 yoF s/p unsuccessful TEER for mitral valve. Pt on apixaban PTA for hx AFib, now s/p brief cardiac arrest and pharmacy asked to transition to IV heparin. Last apixaban dose was 8/26 1050.  S/p placement of IABP on 8/28, will lower heparin/aPTT goals.  Heparin level 0.27 at goal. No overt bleeding or complications noted. CBC stable.  Goal of Therapy:  Heparin level 0.2-0.5 units/ml aPTT 56-85 seconds Monitor platelets by anticoagulation protocol: Yes   Plan:  Continue IV heparin at 500 units/hr. Continue daily heparin level and CBC.  Thank you for allowing pharmacy to participate in this patient's care.  Reatha Harps, PharmD PGY2 Pharmacy Resident 01/18/2022 7:24 AM Check [email or web address] for unit specific pharmacy number

## 2022-01-18 NOTE — Progress Notes (Signed)
   NAME:  Stephanie Franco, MRN:  [id number], DOB:  06/13/1950, LOS: 47 ADMISSION DATE:  12/27/2021, CONSULTATION DATE:  8/26 REFERRING MD:  Debara Pickett, CHIEF COMPLAINT:  dyspnea   History of Present Illness:  71 y/o female with multiple medical problems including severe mitral regurgitation had attempt mitral clip via atrial septostomy; procedure aborted on 8/24 and developed oliguria.  Unfortunately she had a brief cardiac arrest requiring intubation and transfer to ICU. Has required CRRT   Pertinent  Medical History  NICM Afib CKD DM2 Hyperlipidemia Hypertension Asthma Breast cancer CAD Morbid obesity DM2 OSA on CPAP Pulmonary hypertension related to the above  Significant Hospital Events: Including procedures, antibiotic start and stop dates in addition to other pertinent events   8/24 admit, failed mitraclip 8/25 worsening BPs, worsening renal function 8/26 PCCM consult, central line placed, brief code, transfer to ICU intubated, started on CVVH 8/28 RHC and balloon pump placed 8/31 INO stopped today evening.  Pressors and sedation weaned off a.m. and currently tolerating SBT 9/1 no acute issues overnight, complains of sacral pain this a.m. with stage II pressure ulcer seen 9/2 on levophed again, WBC up, low grade temp, DNR, plan for comfort measures  Interim History / Subjective:   Family has decided to go the route of comfort measures  Objective   Blood pressure (!) 123/36, pulse (!) 106, temperature 99 F (37.2 C), temperature source Axillary, resp. rate 16, height _0  (1.6 m), weight 69.8 kg, SpO2 98 %. CVP:  [3 mmHg-29 mmHg] 8 mmHg      Intake/Output Summary (Last 24 hours) at 01/18/2022 0934 Last data filed at 01/18/2022 0800 Gross per 24 hour  Intake 2810.59 ml  Output 484 ml  Net 2326.59 ml   Filed Weights   01/15/22 0446 01/17/22 0500 01/18/22 0458  Weight: 68.6 kg 70.4 kg 69.8 kg    Examination: General:  Resting comfortably in bed HENT: NCAT OP  clear PULM: normal effort CV: balloon pump in place GI: non distended MSK: normal bulk and tone Neuro: drowsy    Resolved Hospital Problem list     Assessment & Plan:  Cardiogenic shock due to acute on chronic systolic heart failure with mixed non-ischemic and ischemic cardiomyopathy CAD Atrial fibrillation CAD Severe MR Pulmonary arterial hypertension AKI on CKD Hypoalbuminemia Anemia DM2 with hyperglycemia Low grade fever, rising WBC> no clear evidence of infection, line sites OK, femoral wound site OK Has stage 2 sacral decub Metastatic breast cancer with pain  The patient's husband and family are supportive of full comfort measures.  I met with her grandchildren yesterday as well as with her husband at length this morning discussing next steps.  Move to full comfort measures Remove balloon pump today No more CRRT Hold further SSI No further labs  PCCM available PRN  Best Practice (right click and "Reselect all SmartList Selections" daily)   Diet/type: tubefeeds DVT prophylaxis: systemic heparin GI prophylaxis: N/A Lines: Central line, Dialysis Catheter, and yes and it is still needed Foley:  N/A Code Status:  DNR Last date of multidisciplinary goals of care discussion [per primary]  Critical care time: n/a    Roselie Awkward, MD [place] [facility] Pager: [phone] Cell: [phone] After 7:00 pm call Elink  [phone]

## 2022-01-18 NOTE — Progress Notes (Signed)
Patient ID: Stephanie Franco, female   DOB: [birth year], 71 y.o.   MRN: [id number]  Advanced Heart Failure Rounding Note  PCP-Cardiologist: Glenetta Hew, MD   Subjective:    08/27: Milrinone switched to DBA d/t pressor requirement 08/28: RHC + IABP placement. Unable to keep Swan in position due to pulsatility of heart with severe TR/MR so removed.  8/31 Extubated.   CVVHD stopped yesterday. On NE and DBA + IABP 1:1 Co-ox 71%  Remains on IV amio and heparin. AF rate controlled  Very somnolent. Moaning in pain. Says she is tired.   Objective:   Weight Range: 69.8 kg Body mass index is 27.26 kg/m.   Vital Signs:   Temp:  [98.1 F (36.7 C)-100.3 F (37.9 C)] 99 F (37.2 C) (09/03 0800) Pulse Rate:  [103-221] 106 (09/03 0700) Resp:  [14-36] 16 (09/03 0815) BP: (97-139)/(28-55) 123/36 (09/03 0400) SpO2:  [96 %-100 %] 98 % (09/03 0815) Arterial Line BP: (91-124)/(40-65) 101/48 (09/03 0815) Weight:  [69.8 kg] 69.8 kg (09/03 1041) Last BM Date : 01/17/22  Weight change: Filed Weights   01/17/22 0500 01/18/22 0458 01/18/22 1041  Weight: 70.4 kg 69.8 kg 69.8 kg    Intake/Output:   Intake/Output Summary (Last 24 hours) at 01/18/2022 1112 Last data filed at 01/18/2022 1028 Gross per 24 hour  Intake 2729.77 ml  Output 161 ml  Net 2568.77 ml      Physical Exam   General:  Ill appearing. Somnolent but arousable. Moaning in pain HEENT: normal + cor-trak Neck: supple. RIJ HD cath Carotids 2+ bilat; no bruits. No lymphadenopathy or thryomegaly appreciated. Cor: PMI nondisplaced. Irregular rate & rhythm. 2/6 MR Lungs: clear Abdomen: soft, nontender, nondistended. No hepatosplenomegaly. No bruits or masses. Good bowel sounds. Extremities: no cyanosis, clubbing, rash, 1+ edema LFA IABP Neuro: somnolent but arousable moves all 4  Telemetry   A fib 80-90 Personally reviewed   Labs    CBC Recent Labs    01/17/22 0450 01/18/22 0410  WBC 15.1* 17.0*  HGB 8.5* 8.4*  HCT  28.6* 27.3*  MCV 85.1 83.5  PLT 146* 146*    Basic Metabolic Panel Recent Labs    01/17/22 0450 01/18/22 0410  NA 136 134*  K 4.4 4.9  CL 102 101  CO2 27 21*  GLUCOSE 206* 279*  BUN 20 59*  CREATININE 1.33* 3.02*  CALCIUM 8.8* 8.6*  MG 2.8* 2.7*  PHOS 1.2* 3.0    Liver Function Tests Recent Labs    01/17/22 0450 01/18/22 0410  ALBUMIN 2.4* 2.1*    No results for input(s): "LIPASE", "AMYLASE" in the last 72 hours. Cardiac Enzymes No results for input(s): "CKTOTAL", "CKMB", "CKMBINDEX", "TROPONINI" in the last 72 hours.  BNP: BNP (last 3 results) Recent Labs    10/07/21 1513 11/12/21 1442 12/01/21 1531  BNP 71.8 91.4 104.2*     ProBNP (last 3 results) No results for input(s): "PROBNP" in the last 8760 hours.   D-Dimer No results for input(s): "DDIMER" in the last 72 hours. Hemoglobin A1C No results for input(s): "HGBA1C" in the last 72 hours. Fasting Lipid Panel Recent Labs    01/17/22 0450  TRIG 101    Thyroid Function Tests No results for input(s): "TSH", "T4TOTAL", "T3FREE", "THYROIDAB" in the last 72 hours.  Invalid input(s): "FREET3"  Other results:   Imaging    No results found.   Medications:     Scheduled Medications:  allopurinol  50 mg Per Tube QPM   atorvastatin  80 mg Per Tube Daily   Chlorhexidine Gluconate Cloth  6 each Topical Daily   docusate  100 mg Per Tube BID   [START ON 01/20/2022] epinephrine  0-10 mcg/min Intravenous To OR   ezetimibe  10 mg Per Tube Daily   feeding supplement (PROSource TF20)  60 mL Per Tube BID   Gerhardt's butt cream   Topical BID   [START ON 01/20/2022] heparin sodium (porcine) 2,500 Units, papaverine 30 mg in electrolyte-A (PLASMALYTE-A PH 7.4) 500 mL irrigation   Irrigation To OR   insulin aspart  0-15 Units Subcutaneous Q4H   insulin glargine-yfgn  30 Units Subcutaneous QHS   [START ON 01/20/2022] insulin   Intravenous To OR   [START ON 01/20/2022] [facility] Blood Cardioplegia vial  (lidocaine/magnesium/mannitol 0.26g-4g-6.4g)   Intracoronary To OR   LORazepam  0.5 mg Per Tube QHS   multivitamin  1 tablet Oral QHS   mouth rinse  15 mL Mouth Rinse 4 times per day   mouth rinse  15 mL Mouth Rinse 4 times per day   oxyCODONE  5 mg Per Tube Q6H   pantoprazole (PROTONIX) IV  40 mg Intravenous Q12H   [START ON 01/20/2022] phenylephrine  30-200 mcg/min Intravenous To OR   polyethylene glycol  17 g Per Tube Daily   [START ON 01/20/2022] potassium chloride  80 mEq Other To OR   senna-docusate  1 tablet Per Tube BID   sodium chloride flush  10-40 mL Intracatheter Q12H   sodium chloride flush  3 mL Intravenous Q12H   [START ON 01/20/2022] tranexamic acid  15 mg/kg Intravenous To OR   [START ON 01/20/2022] tranexamic acid  2 mg/kg Intracatheter To OR    Infusions:  sodium chloride Stopped (01/10/22 2245)   sodium chloride     amiodarone Stopped (01/18/22 0941)   [START ON 01/20/2022]  ceFAZolin (ANCEF) IV     [START ON 01/20/2022]  ceFAZolin (ANCEF) IV     dexmedetomidine (PRECEDEX) IV infusion 0.5 mcg/kg/hr (01/18/22 0800)   [START ON 01/20/2022] dexmedetomidine     DOBUTamine Stopped (01/18/22 0941)   feeding supplement (VITAL 1.5 CAL) 45 mL/hr at 01/18/22 1028   fentaNYL infusion INTRAVENOUS 400 mcg/hr (01/18/22 1028)   [START ON 01/20/2022] heparin 30,000 units/NS 1000 mL solution for CELLSAVER     heparin Stopped (01/18/22 1021)   midazolam 5 mg/hr (01/18/22 1028)   [START ON 01/20/2022] milrinone     [START ON 01/20/2022] nitroGLYCERIN     norepinephrine (LEVOPHED) Adult infusion Stopped (01/18/22 1023)   [START ON 01/20/2022] norepinephrine     [START ON 01/20/2022] tranexamic acid (CYKLOKAPRON) 2,500 mg in sodium chloride 0.9 % 250 mL (10 mg/mL) infusion     [START ON 01/20/2022] vancomycin      PRN Medications: sodium chloride, acetaminophen **OR** acetaminophen, acetaminophen, albuterol, alteplase, bisacodyl, fentaNYL (SUBLIMAZE) injection, glycopyrrolate **OR** glycopyrrolate  **OR** glycopyrrolate, heparin, lip balm, LORazepam, naphazoline-glycerin, ondansetron (ZOFRAN) IV, mouth rinse, mouth rinse, oxyCODONE, polyvinyl alcohol, sodium chloride, sodium chloride flush, sodium chloride flush     Assessment/Plan   1. Acute on chronic systolic CHF => cardiogenic shock: Mixed ischemic/nonischemic cardiomyopathy. RHC in 2/23 showed elevated R>L heart filling pressures with pulmonary arterial hypertension and low CI 1.89. Cardiac MRI w/ myocardial LGE in septum is in a noncoronary pattern, ? myocarditis, anterior wall LGE is subendocardial and may be due to prior MI => ?mixed ischemic/nonischemic cardiomyopathy. TEE (6/23) with EF 30-35%, moderate RV dysfunction, severe MR.  She had attempted Mitraclip placment this admission  with failure, now with cardiogenic shock, AKI, and marked volume overload.  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 Intubated on pressors with CVVH running.  IABP in place, did not place Impella as re-attempted Mitraclip procedure, which is probably our only good option at this time to improve her function, would require manipulation of wire across the LA to LV (discussed with Dr. Ali Lowe).   She is now on NE 12 and IABP 1:1 Co-ox 65% (in setting of L=>R shunt). Remains anuric on CVVHD. CVP 5 . Efforts ongoing to try to stabilize her for repeat Mitraclip + iatrogenic ASD closure.  - Prognosis is guarded.  She has metastatic breast cancer, not candidate for LVAD.  She has severe MR and TR with biventricular dysfunction (ASD also likely plays at least a small role in decompensation).  Failed Mitraclip earlier this admission. Think our only possible option would be to get her stable enough to re-attempt Mitraclip + iatrogenic ASD closure  - Situation reviewed at length  with her, her husband and grandson yesterday. Currently optimized from hemodynamic standpoint but continues to deteriorate with multi-system organ failure and chronic pain in setting of metastatic breast CA. At this point I do not think Mitra-Clip will improve her overall condition or fix the issues which are really causing her to suffer currently. I spoke with Renal and they feel the likelihood of renal recovery is small.  - I discussed again with Ms. Jacuinde and her husband. They have d/w family and have decided on transition to comfort care. I have d/w Renal and CCM and both agree that prognosis for meaningful recovery is poor and given patient's ongoing discomfort a transition to comfort care is best approach. Will start comfort drips and stop aggressive support  2. Atrial fibrillation: This is permanent, x years. Rate controlled+  frequent PVCs (10-15/min).  - can stop amio and heparin  3. Mitral regurgitation:  TEE (6/23) with severe MR with restricted posterior leaflet in setting of dilation of the LA and LV. mTEER attempted on 8/24 but failed.  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   - As above transition to comfort care 4. AKI on CKD stage 3: AKI in setting of cardiogenic shock, now ESRD - CVVHD stopped yesterday. No evidence of renal recovery  Plan transition to comfort care today.   CRITICAL CARE Performed by: Glori Bickers  Total critical care time: 45 minutes  Critical care time was exclusive of separately billable procedures and treating other patients.  Critical care was necessary to treat or prevent imminent or life-threatening deterioration.  Critical care was time spent personally by me (independent of midlevel providers or residents) on the following activities: development of treatment plan with patient and/or surrogate as well as nursing, discussions with consultants, evaluation of patient's response to treatment,  examination of patient, obtaining history from patient or surrogate, ordering and performing treatments and interventions, ordering and review of laboratory studies, ordering and review of radiographic studies, pulse oximetry and re-evaluation of patient's condition.   Mouhamad Teed. MD 01/18/2022  Advanced Heart Failure Team Pager [phone] (M-F; 7a - 5p)  Please contact [facility] for night-coverage after hours (5p -7a ) and weekends on [email or web address]

## 2022-01-18 NOTE — Progress Notes (Signed)
[facility] NEPHROLOGY PROGRESS NOTE  Assessment/ Plan: Pt is a 71 y.o. yo female  w/ hx of anemia, breast cancer, CAD, HFrEF, CKD 3, DM2, gout, HL, HTN, morbid obesity, OSA, pHTN, severe mitral regurgitation w/ LVEF 20-25% who presented on 8/24 for planned TEE for the MR.  Patient became hypoxic, had PEA arrest, intubated, developed AKI and transferred to ICU.  #Acute kidney injury on CKD stage IV: b/l cr arounf 1.7-2.4.  AKI due to cardiac arrest/CHF.  Moved to ICU for inotropes including dobutamine and vasopressors.  Started CRRT on 8/26 till 9/2.   The volume status improved with CRRT.  After discussion with the family and the team the CRRT was stopped on 9/2. She remains anuric without sign of renal recovery.  Clinically not doing well and family are now considering comfort measures.  #Acute on chronic systolic CHF/cardiogenic shock: Currently on dobutamine, Levophed.  UF/volume management with CRRT.  Heart failure team is following.  #A-fib with RVR: On amiodarone and anticoagulation.  #VDRF/acute hypoxic respiratory failure: She is extubated now and on nasal cannula.  Pulmonary team is following.    #Anemia: Monitor hemoglobin.  #Hypophosphatemia: Replete sodium phosphate.  #Acute metabolic encephalopathy: She is very lethargic and minimally following commands.  Prognosis seems poor.  Likely comfort care only.  I have discussed with the patient husband  at the bedside.  Subjective: Seen and examined in ICU.  No urine output.  Remains lethargic confused.  On 2 L of oxygen.  No new event.  Objective Vital signs in last 24 hours: Vitals:   01/18/22 0730 01/18/22 0745 01/18/22 0800 01/18/22 0815  BP:      Pulse:      Resp: 20 (!) '23 14 16  '$ Temp:   99 F (37.2 C)   TempSrc:   Axillary   SpO2: 99% 97% 96% 98%  Weight:      Height:       Weight change: -0.6 kg  Intake/Output Summary (Last 24 hours) at 01/18/2022 0952 Last data filed at 01/18/2022 0800 Gross per  24 hour  Intake 2810.59 ml  Output 484 ml  Net 2326.59 ml        Labs: RENAL PANEL Recent Labs    01/11/22 0357 01/11/22 1610 01/06/2022 0416 12/18/2021 1600 01/07/2022 1852 01/13/22 0442 01/13/22 1648 01/14/22 0338 01/14/22 1719 01/15/22 0425 01/15/22 1755 01/16/22 0451 01/16/22 1539 01/17/22 0450 01/18/22 0410  NA 135   < > 136   < > '135 135 137 136 137 137 138 136 136 136 '$ 134*  K 4.2   < > 4.3   < > 4.3 3.9 4.3 4.7 4.8 4.9 5.2* 4.4 4.2 4.4 4.9  CL 99   < > 104  --  '102 100 103 99 100 102 104 101 101 102 '$ 101  CO2 23   < > 24  --  '22 26 25 25 26 26 26 25 26 27 '$ 21*  GLUCOSE 133*   < > 109*  --  142* 191* 175* 184* 194* 187* 134* 155* 194* 206* 279*  BUN 39*   < > 18  --  '18 19 18 16 16 19 21 19 21 20 '$ 59*  CREATININE 2.88*   < > 1.54*  --  1.75* 1.51* 1.30* 1.27* 1.39* 1.34* 1.22* 1.31* 1.28* 1.33* 3.02*  CALCIUM 8.3*   < > 8.0*  --  8.4* 8.2* 8.7* 9.2 9.3 9.5 9.4 8.9 8.8* 8.8* 8.6*  MG 1.9  --  2.5*  --  2.6* 2.6* 2.8* 2.6*  --  2.8*  --  2.7*  --  2.8* 2.7*  PHOS 3.9   < >  --   --  3.9  3.9 2.3* 1.9*  1.9* 2.8 1.7* 1.7* 1.5* 1.5* 2.5 1.2* 3.0  ALBUMIN 2.9*   < > 2.8*  --  2.6* 2.5* 2.6* 2.7* 2.6* 2.5* 2.5* 2.4* 2.4* 2.4* 2.1*   < > = values in this interval not displayed.      Liver Function Tests: Recent Labs  Lab 01/04/2022 0416 12/17/2021 1852 01/16/22 1539 01/17/22 0450 01/18/22 0410  AST 154*  --   --   --   --   ALT 86*  --   --   --   --   ALKPHOS 74  --   --   --   --   BILITOT 1.0  --   --   --   --   PROT 6.8  --   --   --   --   ALBUMIN 2.8*   < > 2.4* 2.4* 2.1*   < > = values in this interval not displayed.    No results for input(s): "LIPASE", "AMYLASE" in the last 168 hours. No results for input(s): "AMMONIA" in the last 168 hours. CBC: Recent Labs    09/25/21 0835 09/26/21 0214 01/14/22 0338 01/15/22 0425 01/16/22 0451 01/17/22 0450 01/18/22 0410  HGB  --    < > 8.3* 8.4* 8.5* 8.5* 8.4*  MCV  --    < > 86.3 85.6 84.2 85.1 83.5   FERRITIN 442*  --   --   --   --   --   --   TIBC 319  --   --   --   --   --   --   IRON 29  --   --   --   --   --   --    < > = values in this interval not displayed.     Cardiac Enzymes: No results for input(s): "CKTOTAL", "CKMB", "CKMBINDEX", "TROPONINI" in the last 168 hours. CBG: Recent Labs  Lab 01/17/22 1223 01/17/22 1603 01/17/22 1941 01/18/22 0020 01/18/22 0404  GLUCAP 193* 269* 222* 223* 274*     Iron Studies: No results for input(s): "IRON", "TIBC", "TRANSFERRIN", "FERRITIN" in the last 72 hours. Studies/Results: DG CHEST PORT 1 VIEW  Result Date: 01/17/2022 CLINICAL DATA:  Acute respiratory failure with hypoxemia. EXAM: PORTABLE CHEST 1 VIEW COMPARISON:  January 16, 2022. FINDINGS: Stable cardiomegaly. Feeding tube is seen entering stomach. Stable bilateral jugular catheters are noted. Lungs are clear. Bony thorax is unremarkable. IMPRESSION: Stable support apparatus.  No acute pulmonary disease is noted. Electronically Signed   By: Marijo Conception M.D.   On: 01/17/2022 11:27    Medications: Infusions:  sodium chloride Stopped (01/10/22 2245)   amiodarone 30 mg/hr (01/18/22 0800)   [START ON 01/20/2022]  ceFAZolin (ANCEF) IV     [START ON 01/20/2022]  ceFAZolin (ANCEF) IV     dexmedetomidine (PRECEDEX) IV infusion 0.5 mcg/kg/hr (01/18/22 0800)   [START ON 01/20/2022] dexmedetomidine     DOBUTamine 5 mcg/kg/min (01/18/22 0800)   feeding supplement (VITAL 1.5 CAL) 45 mL/hr at 01/18/22 0800   fentaNYL infusion INTRAVENOUS     [START ON 01/20/2022] heparin 30,000 units/NS 1000 mL solution for CELLSAVER     heparin 500 Units/hr (01/18/22 0800)   [START ON 01/20/2022] milrinone     [START ON 01/20/2022] nitroGLYCERIN  norepinephrine (LEVOPHED) Adult infusion 10 mcg/min (01/18/22 0800)   [START ON 01/20/2022] norepinephrine     [START ON 01/20/2022] tranexamic acid (CYKLOKAPRON) 2,500 mg in sodium chloride 0.9 % 250 mL (10 mg/mL) infusion     [START ON 01/20/2022] vancomycin       Scheduled Medications:  allopurinol  50 mg Per Tube QPM   atorvastatin  80 mg Per Tube Daily   Chlorhexidine Gluconate Cloth  6 each Topical Daily   docusate  100 mg Per Tube BID   [START ON 01/20/2022] epinephrine  0-10 mcg/min Intravenous To OR   ezetimibe  10 mg Per Tube Daily   feeding supplement (PROSource TF20)  60 mL Per Tube BID   fentaNYL (SUBLIMAZE) injection  300 mcg Intravenous Once   Gerhardt's butt cream   Topical BID   [START ON 01/20/2022] heparin sodium (porcine) 2,500 Units, papaverine 30 mg in electrolyte-A (PLASMALYTE-A PH 7.4) 500 mL irrigation   Irrigation To OR   insulin aspart  0-15 Units Subcutaneous Q4H   insulin glargine-yfgn  30 Units Subcutaneous QHS   [START ON 01/20/2022] insulin   Intravenous To OR   [START ON 01/20/2022] [facility] Blood Cardioplegia vial (lidocaine/magnesium/mannitol 0.26g-4g-6.4g)   Intracoronary To OR   LORazepam  0.5 mg Per Tube QHS   midazolam  5 mg Intravenous Once   multivitamin  1 tablet Oral QHS   mouth rinse  15 mL Mouth Rinse 4 times per day   mouth rinse  15 mL Mouth Rinse 4 times per day   oxyCODONE  5 mg Per Tube Q6H   pantoprazole (PROTONIX) IV  40 mg Intravenous Q12H   [START ON 01/20/2022] phenylephrine  30-200 mcg/min Intravenous To OR   polyethylene glycol  17 g Per Tube Daily   [START ON 01/20/2022] potassium chloride  80 mEq Other To OR   senna-docusate  1 tablet Per Tube BID   sodium chloride flush  10-40 mL Intracatheter Q12H   sodium chloride flush  3 mL Intravenous Q12H   [START ON 01/20/2022] tranexamic acid  15 mg/kg Intravenous To OR   [START ON 01/20/2022] tranexamic acid  2 mg/kg Intracatheter To OR    have reviewed scheduled and prn medications.  Physical Exam: General: Critically ill looking female, frail, lethargic, lying on bed, following simple commands. Heart:RRR, s1s2 nl, CVP around 5. Lungs: Coarse breath sound bilateral Abdomen:soft, Non-tender, non-distended Extremities:No edema Dialysis Access:  Right IJ temporary HD catheter in place  Kaylene Dawn Prasad Jorene Kaylor 01/18/2022,9:52 AM  LOS: 10 days

## 2022-01-20 ENCOUNTER — Telehealth: Payer: Self-pay | Admitting: Family Medicine

## 2022-01-20 SURGERY — MITRAL VALVE REPAIR
Anesthesia: General

## 2022-01-20 NOTE — Telephone Encounter (Signed)
Sympathy card sent 

## 2022-01-22 LAB — CULTURE, BLOOD (ROUTINE X 2)
Culture: NO GROWTH
Culture: NO GROWTH
Special Requests: ADEQUATE

## 2022-01-26 ENCOUNTER — Ambulatory Visit: Payer: Medicare Other | Admitting: Cardiology

## 2022-01-27 NOTE — Addendum Note (Signed)
Encounter addended by: Rafael Bihari, FNP on: 01/27/2022 8:11 AM  Actions taken: Clinical Note Signed

## 2022-02-04 ENCOUNTER — Ambulatory Visit: Payer: Medicare Other | Admitting: Physician Assistant

## 2022-02-15 NOTE — Death Summary Note (Signed)
Advanced Heart Failure Death Summary  Death Summary   Patient ID: Stephanie Franco MRN: [id number], DOB/AGE: [birth year] 71 y.o. Admit date: 12/24/2021 D/C date:     02-03-22   Primary Discharge Diagnoses:  Acute on Chronic Systolic Heart Failure>>Cardiogenic Shock  Severe Mitral Regurgitation  PEA Arrest  Acute Hypoxic Respiratory Failure Requiring Intubation  Acute Anuric Renal failure on Stage III CKD  Atrial Fibrillation w/ RVR Pulmonary Hypertension  Metastatic Breast Cancer    HPI/ Hospital Course:   Ms Prestwood is a 71 y.o. with history of permanent atrial fibrillation for 20 years, DMII, HTN, Hyperlipidemia, left breast cancer (had mastectomy 20 years ago), right hip replacement, arthritis,  CAD with prior PCI OM in 4765 and systolic heart failure.    She was admitted 06/20/21 with chest pain, but also noted to have orthopnea and worsening dyspnea. HS Trop 71>58>51. Echo showed EF 25-30%, moderate RV dysfunction, moderate biatrial enlargement, at least moderate posteriorly-directed MR. LHC/RHC showed stable CAD with elevated R>L heart filling pressures and low cardiac index 1.89.  AHF team consulted. She was started on milrinone, PICC placed. Started on IV lasix and added amiodarone given frequent PVCs. Underwent further w/u of pulmonary hypertension and HF. V/Q scan was not suggestive of chronic PE.  Serologic workup negative (ANA, anti-centromere ab, anti-scl70, RF). TEE was done to better assess severity of her MR. This showed restriction of the posterior mitral leaflet and moderate eccentric mitral regurgitation (posteriorly-directed), normal LV size w/ global HK EF 35-40%, RV size normal w/ moderate systolic dysfunction, severe LAE. Suspect functional MR, possibly atrial functional MR. 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  LVEF on MRI 36%, MR moderate. She was weaned off of milrinone w/ stable  co-ox and transition to PO diuretics, torsemide 40 mg daily. GDMT added. SCr stabilized ~1.9. She was discharged home, weight 188 lbs.    Seen in ED 3/23 with CP, SOB and elevated SCr. Cards saw, felt she was on the dry side. Given gentle IVF, digoxin and amio stopped due to bradycardia/junctional rhythm, and hydralazine stopped due to fatigue.    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  Biopsy was done showing breast cancer primary. She has been started on letrozole.    Echo 5/23 showed EF 30-35%, mildly dilated RV with mild RV systolic dysfunction, PASP 67 mmHg, moderate-severe functional MR with posterior leaflet restriction and dilated IVC.  She has been off losartan, and torsemide has been decreased to 20 mg bid due to elevated creatinine (most recently 2.08).    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 TEE showed EF 30-35%, moderate RV systolic dysfunction, moderate TR, severe MR. She was seen by Dr. Ali Lowe for Mitraclip evaluation.    Patient had attempted Mitraclip procedure 8/24 but unable to advance guide catheter successfully across septum septum despite repeated balloon septostomy. She was kept in the hospital for diuresis post-procedure and developed progressive AKI with initial creatinine 2.4 increasing to 3.6. Became oliguric, developed somnolence and brief PEA arrest on 8/26. She was intubated and transported to [facility].  She required escalating pressors/ inotropic support with poor diuresis and was started on CVVH.   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   D/w structural heart team, plan was to try to  stabilize and reattempt MitraClip procedure, as this was felt to be the only good option to improve her function (not candidate for LVAD given metastatic breast cancer). On 2/28, underwent RHC w/  SWAN placement for closer monitoring of hemodynamics and IABP placement for mechanical support, in addition to continuation of dual inotropic support w/ DBA and NE. Was able to stabilize from hemodynamic standpoint but continued to deteriorate with multi-system organ failure and chronic pain in setting of metastatic breast CA. After further consideration, he was felt that MitraClip would not improve her overall condition. Nephrology also felt the likelihood of renal recovery to be low. After goals of care discussion w/ palliative care team, patient and family opted to transition to comfort care. CVVHD, inotropes/ pressors discontinued. Comfort gtts started. Patient passed 02-05-22.      Significant Diagnostic Studies  2D Echo 01/10/22 left ventricular ejection fraction, by estimation, is 25 to 30%. The left ventricle has severely decreased function. The left ventricle demonstrates global hypokinesis. There is the interventricular septum is flattened in systole and diastole, consistent with right ventricular pressure and volume overload. 1. Right ventricular systolic function is moderately reduced. The right ventricular size is moderately enlarged. There is severely elevated pulmonary artery systolic pressure. The estimated right ventricular systolic pressure is 81.1 mmHg. 2. 3. Left atrial size was severely dilated. 4. Right atrial size was severely dilated. A small pericardial effusion is present. The pericardial effusion is localized near the right ventricle. 5. 6. Severe mitral valve regurgitation. 7. Tricuspid valve regurgitation is severe. The inferior vena cava is dilated in size with <50% respiratory variability, suggesting right atrial pressure of 15 mmHg. 8. Evidence of atrial level shunting  detected by color flow Doppler. There is a moderately sized secundum atrial septal defect with predominantly left to right shunting across the atrial septum.   [place] 01/06/2022  RHC Procedural Findings: Hemodynamics (mmHg) RA mean 22, V wave to 32 RV 74/18 PA 78/33, mean 51 PCWP mean 28, V wave to 38  Oxygen saturations: IVC 73%  RA 85% RV 85% PA 85% AO 100%  Cardiac Output (Fick) 13 (high output with shunt) Cardiac Index (Fick) 7.7  PVR < 2 WU  Shunt fraction 1.8/1  Consultations  Advanced Heart Failure Structural Heart  Pulmonary Critical Care Medicine Nephrology  Palliative Care    Duration of Discharge Encounter: Greater than 35 minutes   Signed, Lyda Jester, PA-C 02/05/22, 7:54 AM

## 2022-02-15 DEATH — deceased

## 2022-02-16 ENCOUNTER — Encounter (HOSPITAL_COMMUNITY): Payer: Medicare Other | Admitting: Cardiology

## 2022-02-18 ENCOUNTER — Ambulatory Visit: Payer: Medicare Other | Admitting: Nurse Practitioner

## 2022-02-18 ENCOUNTER — Ambulatory Visit: Payer: Medicare Other

## 2022-02-18 ENCOUNTER — Other Ambulatory Visit: Payer: Medicare Other

## 2022-03-11 ENCOUNTER — Ambulatory Visit: Payer: Medicare Other | Admitting: Medical

## 2023-02-05 ENCOUNTER — Other Ambulatory Visit (HOSPITAL_COMMUNITY): Payer: Self-pay

## 2023-06-16 ENCOUNTER — Other Ambulatory Visit (HOSPITAL_COMMUNITY): Payer: Self-pay

## 2024-06-18 DEATH — deceased
